# Patient Record
Sex: Male | Born: 1945 | Race: White | Hispanic: No | Marital: Married | Smoking: Former smoker
Health system: Southern US, Community
[De-identification: ages and names within clinical notes are randomized; demographics above are authoritative.]

## PROBLEM LIST (undated history)

## (undated) DIAGNOSIS — R51 Headache: Secondary | ICD-10-CM

## (undated) DIAGNOSIS — Z972 Presence of dental prosthetic device (complete) (partial): Secondary | ICD-10-CM

## (undated) DIAGNOSIS — M545 Low back pain, unspecified: Secondary | ICD-10-CM

## (undated) DIAGNOSIS — R296 Repeated falls: Secondary | ICD-10-CM

## (undated) DIAGNOSIS — G510 Bell's palsy: Secondary | ICD-10-CM

## (undated) DIAGNOSIS — C44621 Squamous cell carcinoma of skin of unspecified upper limb, including shoulder: Secondary | ICD-10-CM

## (undated) DIAGNOSIS — I10 Essential (primary) hypertension: Secondary | ICD-10-CM

## (undated) DIAGNOSIS — E119 Type 2 diabetes mellitus without complications: Secondary | ICD-10-CM

## (undated) DIAGNOSIS — B029 Zoster without complications: Secondary | ICD-10-CM

## (undated) DIAGNOSIS — M79605 Pain in left leg: Secondary | ICD-10-CM

## (undated) DIAGNOSIS — G214 Vascular parkinsonism: Secondary | ICD-10-CM

## (undated) DIAGNOSIS — M79669 Pain in unspecified lower leg: Secondary | ICD-10-CM

## (undated) DIAGNOSIS — I639 Cerebral infarction, unspecified: Secondary | ICD-10-CM

## (undated) DIAGNOSIS — R0602 Shortness of breath: Secondary | ICD-10-CM

## (undated) DIAGNOSIS — I209 Angina pectoris, unspecified: Secondary | ICD-10-CM

## (undated) DIAGNOSIS — F32A Depression, unspecified: Secondary | ICD-10-CM

## (undated) DIAGNOSIS — I2510 Atherosclerotic heart disease of native coronary artery without angina pectoris: Secondary | ICD-10-CM

## (undated) DIAGNOSIS — E785 Hyperlipidemia, unspecified: Secondary | ICD-10-CM

## (undated) DIAGNOSIS — F329 Major depressive disorder, single episode, unspecified: Secondary | ICD-10-CM

## (undated) DIAGNOSIS — E1142 Type 2 diabetes mellitus with diabetic polyneuropathy: Secondary | ICD-10-CM

## (undated) DIAGNOSIS — S32009A Unspecified fracture of unspecified lumbar vertebra, initial encounter for closed fracture: Secondary | ICD-10-CM

## (undated) DIAGNOSIS — K219 Gastro-esophageal reflux disease without esophagitis: Secondary | ICD-10-CM

## (undated) DIAGNOSIS — G8929 Other chronic pain: Secondary | ICD-10-CM

## (undated) HISTORY — DX: Essential (primary) hypertension: I10

## (undated) HISTORY — PX: FRACTURE SURGERY: SHX138

## (undated) HISTORY — PX: FOOT FRACTURE SURGERY: SHX645

## (undated) HISTORY — PX: BACK SURGERY: SHX140

## (undated) HISTORY — PX: CORONARY ANGIOPLASTY WITH STENT PLACEMENT: SHX49

## (undated) HISTORY — PX: APPENDECTOMY: SHX54

## (undated) HISTORY — DX: Angina pectoris, unspecified: I20.9

## (undated) HISTORY — DX: Major depressive disorder, single episode, unspecified: F32.9

## (undated) HISTORY — DX: Hyperlipidemia, unspecified: E78.5

## (undated) HISTORY — PX: TIBIA FRACTURE SURGERY: SHX806

## (undated) HISTORY — DX: Zoster without complications: B02.9

## (undated) HISTORY — DX: Cerebral infarction, unspecified: I63.9

## (undated) HISTORY — DX: Atherosclerotic heart disease of native coronary artery without angina pectoris: I25.10

## (undated) HISTORY — DX: Depression, unspecified: F32.A

## (undated) HISTORY — DX: Bell's palsy: G51.0

## (undated) HISTORY — DX: Pain in unspecified lower leg: M79.669

---

## 1979-07-21 HISTORY — PX: CHOLECYSTECTOMY OPEN: SUR202

## 1999-07-21 HISTORY — PX: TESTICLE SURGERY: SHX794

## 2006-08-23 ENCOUNTER — Emergency Department: Payer: Self-pay | Admitting: Emergency Medicine

## 2008-07-17 ENCOUNTER — Ambulatory Visit: Payer: Self-pay | Admitting: Family Medicine

## 2008-08-08 ENCOUNTER — Inpatient Hospital Stay: Payer: Self-pay | Admitting: Internal Medicine

## 2008-08-08 ENCOUNTER — Other Ambulatory Visit: Payer: Self-pay

## 2008-08-09 ENCOUNTER — Other Ambulatory Visit: Payer: Self-pay

## 2008-10-18 ENCOUNTER — Emergency Department: Payer: Self-pay | Admitting: Unknown Physician Specialty

## 2008-10-27 ENCOUNTER — Ambulatory Visit: Payer: Self-pay | Admitting: Unknown Physician Specialty

## 2008-11-02 ENCOUNTER — Ambulatory Visit: Payer: Self-pay | Admitting: Unknown Physician Specialty

## 2008-11-05 ENCOUNTER — Ambulatory Visit: Payer: Self-pay | Admitting: Unknown Physician Specialty

## 2008-12-07 ENCOUNTER — Encounter: Payer: Self-pay | Admitting: Unknown Physician Specialty

## 2008-12-20 ENCOUNTER — Encounter: Payer: Self-pay | Admitting: Unknown Physician Specialty

## 2010-03-21 ENCOUNTER — Ambulatory Visit: Payer: Self-pay | Admitting: Internal Medicine

## 2010-04-19 ENCOUNTER — Ambulatory Visit: Payer: Self-pay | Admitting: Internal Medicine

## 2010-05-19 ENCOUNTER — Ambulatory Visit: Payer: Self-pay | Admitting: Internal Medicine

## 2010-06-19 ENCOUNTER — Ambulatory Visit: Payer: Self-pay | Admitting: Internal Medicine

## 2011-05-18 LAB — HEMOGLOBIN A1C: Hgb A1c MFr Bld: 13 % — AB (ref 4.0–6.0)

## 2011-05-18 LAB — HEPATIC FUNCTION PANEL
ALT: 22 U/L (ref 10–40)
Alkaline Phosphatase: 91 U/L (ref 25–125)
Bilirubin, Total: 0.5 mg/dL

## 2011-06-28 ENCOUNTER — Encounter: Payer: Self-pay | Admitting: Internal Medicine

## 2011-07-04 ENCOUNTER — Encounter: Payer: Self-pay | Admitting: Internal Medicine

## 2011-07-04 DIAGNOSIS — F329 Major depressive disorder, single episode, unspecified: Secondary | ICD-10-CM | POA: Insufficient documentation

## 2011-07-04 DIAGNOSIS — I10 Essential (primary) hypertension: Secondary | ICD-10-CM | POA: Insufficient documentation

## 2011-07-04 DIAGNOSIS — I2510 Atherosclerotic heart disease of native coronary artery without angina pectoris: Secondary | ICD-10-CM | POA: Insufficient documentation

## 2011-07-04 DIAGNOSIS — E785 Hyperlipidemia, unspecified: Secondary | ICD-10-CM | POA: Insufficient documentation

## 2011-07-04 DIAGNOSIS — E1149 Type 2 diabetes mellitus with other diabetic neurological complication: Secondary | ICD-10-CM | POA: Insufficient documentation

## 2011-07-12 ENCOUNTER — Other Ambulatory Visit: Payer: Self-pay | Admitting: Internal Medicine

## 2011-07-12 DIAGNOSIS — E785 Hyperlipidemia, unspecified: Secondary | ICD-10-CM

## 2011-07-12 MED ORDER — LISINOPRIL-HYDROCHLOROTHIAZIDE 20-12.5 MG PO TABS: 1.0000 | ORAL_TABLET | Freq: Two times a day (BID) | ORAL | Status: DC

## 2011-07-12 NOTE — Telephone Encounter (Signed)
Faxed rx to [facility] to [facility]

## 2011-07-18 ENCOUNTER — Other Ambulatory Visit: Payer: Self-pay | Admitting: Internal Medicine

## 2011-07-19 ENCOUNTER — Ambulatory Visit (INDEPENDENT_AMBULATORY_CARE_PROVIDER_SITE_OTHER): Payer: Medicare Other | Admitting: Internal Medicine

## 2011-07-19 ENCOUNTER — Ambulatory Visit: Payer: Medicare Other | Admitting: Internal Medicine

## 2011-07-19 ENCOUNTER — Encounter: Payer: Self-pay | Admitting: Internal Medicine

## 2011-07-19 VITALS — BP 133/75 | HR 70 | Temp 98.2°F | Resp 16 | Ht 72.0 in | Wt 248.2 lb

## 2011-07-19 DIAGNOSIS — I10 Essential (primary) hypertension: Secondary | ICD-10-CM

## 2011-07-19 DIAGNOSIS — J189 Pneumonia, unspecified organism: Secondary | ICD-10-CM

## 2011-07-19 DIAGNOSIS — E119 Type 2 diabetes mellitus without complications: Secondary | ICD-10-CM

## 2011-07-19 DIAGNOSIS — E785 Hyperlipidemia, unspecified: Secondary | ICD-10-CM

## 2011-07-19 DIAGNOSIS — F329 Major depressive disorder, single episode, unspecified: Secondary | ICD-10-CM

## 2011-07-19 LAB — GLUCOSE, POCT (MANUAL RESULT ENTRY): POC Glucose: 306

## 2011-07-19 MED ORDER — LISINOPRIL-HYDROCHLOROTHIAZIDE 20-12.5 MG PO TABS: 1.0000 | ORAL_TABLET | Freq: Two times a day (BID) | ORAL | Status: DC

## 2011-07-19 MED ORDER — DULOXETINE HCL 60 MG PO CPEP: 60.0000 mg | ORAL_CAPSULE | Freq: Every day | ORAL | Status: DC

## 2011-07-19 MED ORDER — LEVOFLOXACIN 750 MG PO TABS: 750.0000 mg | ORAL_TABLET | Freq: Every day | ORAL | Status: AC

## 2011-07-19 NOTE — Telephone Encounter (Signed)
Opened in error

## 2011-07-19 NOTE — Patient Instructions (Signed)
Pneumonia Pneumonia is an infection of the lungs. It may be caused by a bacteria or virus. Most forms are bacterial. Usually, these infections are caused by breathing infectious particles into the lungs (respiratory tract). SYMPTOMS  The most common problems (symptoms) are:   Cough.  Fever.   Chest pain.  Increased rate of breathing.   Wheezing.  Mucus production.   DIAGNOSIS  Often these infections are diagnosed on exam by your caregiver. Sometimes the diagnosis may require:   Chest X-rays.   Blood analysis.   Cultures. Blood cultures may be done to help find the cause of your pneumonia.  Your caregiver may do tests (blood gasses or pulse oximetry) to see how well your lungs are working. TREATMENT The bacterial pneumonias generally respond well to medicines (antibiotics) that kill germs. Viral infections must run their course. These infections will not respond to antibiotics. A pneumococcal shot (vaccine) is available to prevent a common bacterial pneumonia. This is usually suggested for the elderly and for other groups of higher risk individuals, such as those on chemotherapy or those who have problems with their immune system.  You will have pneumococcal screening or vaccination if you are over 65 years old and are not immunized.   If you are a smoker, it is time to quit. You may receive instructions on how to best stop smoking. Your caregiver can provide medicines and counseling to help you quit.  HOME CARE INSTRUCTIONS  Cough suppressants may be used if you are losing too much rest. However, coughing protects you by clearing your lungs. This is one reason for not using cough suppressants, if able, as they take away this protection.   Your caregiver may have prescribed an antibiotic if she or he feels your cough is caused by a bacterial infection. Take all your medicine until you are finished.   Your caregiver may also prescribe an expectorant to loosen the mucus to be coughed  up.   Only take over-the-counter or prescription medicines for pain, discomfort, or fever as directed by your caregiver.   Smoking is a common cause of bronchitis and can contribute to pneumonia. Stopping this habit is an important self-help step.   If you are a smoker and continue to smoke, your cough may last several weeks after your pneumonia has cleared.   A cold steam vaporizer or humidifier in your room or home may help loosen mucus.   Coughing is often worse at night. Sleeping in a semi-upright position in a recliner or using a couple pillows under your head will help with this.   Get rest as you feel it is needed. Your body will usually let you know when to rest.  SEEK IMMEDIATE MEDICAL CARE IF:  You develop pus-like mucus (sputum) or your illness becomes worse. This is especially true if you are elderly or weakened from any other disease.   You cannot control your cough with suppressants and are losing sleep.   You begin coughing up blood.   You develop pain which is getting worse or is uncontrolled with medicines.   You or your child has an oral temperature above 101.5, not controlled by medicine.   Any of the symptoms which initially brought you in for treatment are getting worse rather than better.   You develop shortness of breath or chest pain.  MAKE SURE YOU:   Understand these instructions.   Will watch your condition.   Will get help right away if you are not doing well or   get worse.  Document Released: 11/05/2005 Document Re-Released: 04/25/2010 [employer] Patient Information 2011 [employer]. 

## 2011-07-19 NOTE — Progress Notes (Signed)
Subjective:    Patient ID: Steve Phillips, male    DOB: 02/09/1946, 65 y.o.   MRN: [id number]  Diabetes He presents for his follow-up diabetic visit. He has type 2 diabetes mellitus. No MedicAlert identification noted. His disease course has been worsening. Hypoglycemia symptoms include headaches and sweats. Pertinent negatives for hypoglycemia include no nervousness/anxiousness. Associated symptoms include fatigue and weight loss. Pertinent negatives for diabetes include no blurred vision, no chest pain, no foot ulcerations, no polydipsia, no polyphagia, no polyuria, no visual change and no weakness. There are no hypoglycemic complications. Symptoms are stable. Diabetic complications include heart disease and peripheral neuropathy. Risk factors for coronary artery disease include diabetes mellitus, dyslipidemia, family history, hypertension, male sex, obesity and sedentary lifestyle. Current diabetic treatment includes diet and insulin injections. He is compliant with treatment most of the time. His weight is stable. He is following a diabetic and generally healthy diet. He has not had a previous visit with a dietician. He participates in exercise intermittently. His home blood glucose trend is increasing steadily. His breakfast blood glucose range is generally >200 mg/dl. (Blood sugars have been elevated in setting of recent illness) An ACE inhibitor/angiotensin II receptor blocker is being taken. Eye exam is current.  Cough This is a new problem. The current episode started 1 to 4 weeks ago. The problem has been unchanged. The problem occurs every few minutes. The cough is productive of purulent sputum. Associated symptoms include chills, ear congestion, ear pain, headaches, myalgias, nasal congestion, postnasal drip, a sore throat, shortness of breath, sweats and weight loss. Pertinent negatives include no chest pain, fever, rash or wheezing. The symptoms are aggravated by nothing. Treatments tried:  augmentin x7 days. The treatment provided mild relief. There is no history of asthma or COPD.      Review of Systems  Constitutional: Positive for chills, weight loss and fatigue. Negative for fever, activity change, appetite change and unexpected weight change.  HENT: Positive for ear pain, sore throat, postnasal drip and sinus pressure.   Eyes: Negative for blurred vision and visual disturbance.  Respiratory: Positive for cough, chest tightness and shortness of breath. Negative for wheezing.   Cardiovascular: Negative for chest pain, palpitations and leg swelling.  Gastrointestinal: Negative for nausea, abdominal pain, diarrhea, constipation and abdominal distention.  Genitourinary: Negative for dysuria, urgency, polyuria and difficulty urinating.  Musculoskeletal: Positive for myalgias. Negative for arthralgias and gait problem.  Skin: Negative for color change and rash.  Neurological: Positive for headaches. Negative for weakness.  Hematological: Negative for polydipsia, polyphagia and adenopathy.  Psychiatric/Behavioral: Negative for sleep disturbance and dysphoric mood. The patient is not nervous/anxious.        Objective:   Physical Exam  Constitutional: He is oriented to person, place, and time. He appears well-developed and well-nourished. No distress.  HENT:  Head: Normocephalic and atraumatic.  Right Ear: External ear normal.  Left Ear: External ear normal.  Nose: Nose normal.  Mouth/Throat: Oropharynx is clear and moist. No oropharyngeal exudate.  Eyes: Conjunctivae and EOM are normal. Pupils are equal, round, and reactive to light. Right eye exhibits no discharge. Left eye exhibits no discharge. No scleral icterus.  Neck: Normal range of motion. Neck supple. No tracheal deviation present. No thyromegaly present.  Cardiovascular: Normal rate, regular rhythm and normal heart sounds.  Exam reveals no gallop and no friction rub.   No murmur heard. Pulmonary/Chest: No  accessory muscle usage. Not tachypneic. No respiratory distress. He has no decreased breath sounds. He has  no wheezes. He has no rhonchi. He has rales in the right lower field. He exhibits no tenderness.  Musculoskeletal: Normal range of motion. He exhibits no edema.  Lymphadenopathy:    He has no cervical adenopathy.  Neurological: He is alert and oriented to person, place, and time. No cranial nerve deficit. Coordination normal.  Skin: Skin is warm and dry. No rash noted. He is not diaphoretic. No erythema. No pallor.  Psychiatric: He has a normal mood and affect. His behavior is normal. Judgment and thought content normal.          Assessment & Plan:  1. Pneumonia - patient with a three-week history of cough productive of purulent sputum. Was initially seen by another provider and treated with a seven-day course of Augmentin. He reports minimal improvement then recurrence of his symptoms. Exam today is remarkable for right lower lobe Rales. Will get chest x-ray today. Will treat with Levaquin 750 mg daily x7 days. Patient will return to clinic in one week for recheck. He will call or come to clinic sooner should his symptoms worsen.  2. Diabetes - patient with a history of diabetes which recently has been poorly controlled secondary to non-compliance with diet and medications. Last hemoglobin A1c was 13% in 05/2011. One month ago, he had made significant improvement in his diet with a reduction in fasting sugars closer to 150. However in the setting of his recent illness his sugars have increased, with fasting blood sugars in the 200s. Will recheck hemoglobin A1c today. He will return to clinic in one week as above. He will bring record of his blood sugars to that visit.

## 2011-07-20 LAB — COMPREHENSIVE METABOLIC PANEL
ALT: 28 U/L (ref 0–53)
Albumin: 4 g/dL (ref 3.5–5.2)
BUN: 15 mg/dL (ref 6–23)
CO2: 27 mEq/L (ref 19–32)
Calcium: 9.6 mg/dL (ref 8.4–10.5)
Chloride: 101 mEq/L (ref 96–112)
Creatinine, Ser: 0.9 mg/dL (ref 0.4–1.5)
GFR: 89.45 mL/min (ref >60.00)

## 2011-07-20 LAB — CBC WITH DIFFERENTIAL/PLATELET
Basophils Absolute: 0.1 10*3/uL (ref 0.0–0.1)
Basophils Relative: 0.7 % (ref 0.0–3.0)
Hemoglobin: 15.8 g/dL (ref 13.0–17.0)
Lymphocytes Relative: 14.4 % (ref 12.0–46.0)
Monocytes Relative: 5.3 % (ref 3.0–12.0)
Neutro Abs: 11 10*3/uL — ABNORMAL HIGH (ref 1.4–7.7)
RBC: 6.24 Mil/uL — ABNORMAL HIGH (ref 4.22–5.81)

## 2011-07-25 ENCOUNTER — Encounter: Payer: Self-pay | Admitting: Internal Medicine

## 2011-07-27 ENCOUNTER — Encounter: Payer: Self-pay | Admitting: Internal Medicine

## 2011-07-27 ENCOUNTER — Ambulatory Visit (INDEPENDENT_AMBULATORY_CARE_PROVIDER_SITE_OTHER): Payer: Medicare Other | Admitting: Internal Medicine

## 2011-07-27 VITALS — BP 131/76 | HR 73 | Temp 98.0°F | Resp 16 | Ht 72.0 in | Wt 252.2 lb

## 2011-07-27 DIAGNOSIS — H6090 Unspecified otitis externa, unspecified ear: Secondary | ICD-10-CM

## 2011-07-27 DIAGNOSIS — H60399 Other infective otitis externa, unspecified ear: Secondary | ICD-10-CM

## 2011-07-27 DIAGNOSIS — J40 Bronchitis, not specified as acute or chronic: Secondary | ICD-10-CM

## 2011-07-27 DIAGNOSIS — E119 Type 2 diabetes mellitus without complications: Secondary | ICD-10-CM

## 2011-07-27 MED ORDER — CIPROFLOXACIN-DEXAMETHASONE 0.3-0.1 % OT SUSP: 4.0000 [drp] | Freq: Two times a day (BID) | OTIC | Status: AC

## 2011-07-27 NOTE — Patient Instructions (Signed)
ENT visit next week. Follow up in 3 months or earlier if needed.

## 2011-07-27 NOTE — Progress Notes (Signed)
Subjective:    Patient ID: Steve Phillips, male    DOB: [birth year], 65 y.o.   MRN: [id number]  HPI  Mr. Bishop is a 65 year old male with a history of diabetes who presents to followup a recent episode of bronchitis. He reports since completing treatment with Levaquin his symptoms have significantly improved. He continues to have some mild chest congestion and occasional cough but denies any purulent sputum, fever, chest pain or other complaints.  Mr. Quast is concerned about drainage from his right ear. He reports a history of surgical repair of the right eardrum by Dr. Andee Poles. Over the last several weeks he has noticed purulent drainage from his right ear. He occasionally has pain in the right ear but this is minimal. As above, he denies any fever. And, notably, he recently completed a seven-day course of Levaquin.  Mr. Harshfield has a history of diabetes mellitus which had previously been very poorly controlled with a hemoglobin A1c of 13%. At his last visit he had his A1c repeated and it was noted to be 9.9%. He has made a significant effort to improve his diet by decreasing intake of processed carbohydrates. He reports much improved compliance with his medications. He denies any low sugars less than 80. He continues to have some high sugars greater than 200.  Outpatient Encounter Prescriptions as of 07/27/2011  Medication Sig Dispense Refill  . amLODipine-valsartan (EXFORGE) 10-320 MG per tablet Take 1 tablet by mouth daily.        Marland Kitchen aspirin 81 MG tablet Take 81 mg by mouth daily.        Marland Kitchen atorvastatin (LIPITOR) 10 MG tablet Take 10 mg by mouth daily.        . carvedilol (COREG) 25 MG tablet Take 25 mg by mouth 2 (two) times daily.        . ciprofloxacin-dexamethasone (CIPRODEX) otic suspension Place 4 drops into the right ear 2 (two) times daily.  7.5 mL  0  . clopidogrel (PLAVIX) 75 MG tablet Take 75 mg by mouth daily.        . DULoxetine (CYMBALTA) 60 MG capsule Take 1 capsule (60 mg total) by  mouth daily.  90 capsule  4  . Eszopiclone (ESZOPICLONE) 3 MG TABS Take 3 mg by mouth at bedtime. Take immediately before bedtime       . furosemide (LASIX) 20 MG tablet Take 20 mg by mouth daily.        . insulin aspart (NOVOLOG FLEXPEN) 100 UNIT/ML injection Inject into the skin. As directed       . insulin glargine (LANTUS) 100 UNIT/ML injection Inject 80 Units into the skin 2 (two) times daily.        . isosorbide mononitrate (IMDUR) 30 MG 24 hr tablet Take 30 mg by mouth daily.        Marland Kitchen levofloxacin (LEVAQUIN) 750 MG tablet Take 1 tablet (750 mg total) by mouth daily.  7 tablet  0  . lisinopril-hydrochlorothiazide (PRINZIDE,ZESTORETIC) 20-12.5 MG per tablet Take 1 tablet by mouth 2 (two) times daily.  180 tablet  4  . metFORMIN (GLUMETZA) 1000 MG (MOD) 24 hr tablet Take 1,000 mg by mouth 2 (two) times daily.        Marland Kitchen NITROGLYCERIN PO Take by mouth. Sublingual....specific dose is unknown       . pantoprazole (PROTONIX) 40 MG tablet Take 40 mg by mouth daily.           Review of Systems  Constitutional: Negative  for fever, chills, activity change, appetite change, fatigue and unexpected weight change.  HENT: Positive for ear pain, congestion and ear discharge. Negative for neck pain, neck stiffness and sinus pressure.   Eyes: Negative for visual disturbance.  Respiratory: Positive for cough. Negative for shortness of breath and wheezing.   Cardiovascular: Negative for chest pain, palpitations and leg swelling.  Gastrointestinal: Negative for abdominal pain and abdominal distention.  Genitourinary: Negative for dysuria, urgency and difficulty urinating.  Musculoskeletal: Negative for arthralgias and gait problem.  Skin: Negative for color change and rash.  Hematological: Negative for adenopathy.  Psychiatric/Behavioral: Negative for sleep disturbance and dysphoric mood. The patient is not nervous/anxious.        BP 131/76  Pulse 73  Temp(Src) 98 F (36.7 C) (Oral)  Resp 16  Ht 6'  (1.829 m)  Wt 252 lb 4 oz (114.42 kg)  BMI 34.21 kg/m2  SpO2 97%  Objective:   Physical Exam  Constitutional: He is oriented to person, place, and time. He appears well-developed and well-nourished. No distress.  HENT:  Head: Normocephalic and atraumatic.  Right Ear: There is drainage (white). No tenderness.  Left Ear: Tympanic membrane, external ear and ear canal normal.  Nose: Nose normal.  Mouth/Throat: Oropharynx is clear and moist. No oropharyngeal exudate.  Eyes: Conjunctivae and EOM are normal. Pupils are equal, round, and reactive to light. Right eye exhibits no discharge. Left eye exhibits no discharge. No scleral icterus.  Neck: Normal range of motion. Neck supple. No tracheal deviation present. No thyromegaly present.  Cardiovascular: Normal rate, regular rhythm and normal heart sounds.  Exam reveals no gallop and no friction rub.   No murmur heard. Pulmonary/Chest: Effort normal and breath sounds normal. No respiratory distress. He has no wheezes. He has no rales. He exhibits no tenderness.  Musculoskeletal: Normal range of motion. He exhibits no edema.  Lymphadenopathy:    He has no cervical adenopathy.  Neurological: He is alert and oriented to person, place, and time. No cranial nerve deficit. Coordination normal.  Skin: Skin is warm and dry. No rash noted. He is not diaphoretic. No erythema. No pallor.  Psychiatric: He has a normal mood and affect. His behavior is normal. Judgment and thought content normal.          Assessment & Plan:  1. Bronchitis - patient with the recent history of bronchitis treated with Levaquin. Chest x-ray was normal. Labs were remarkable for elevated white blood cell count. He is improved and exam is normal today. Will continue to follow. He will call or return to clinic should symptoms recur.  2. Otitis Externa - patient with recent drainage from right ear. Exam is remarkable for purulent debris within the right ear canal. He reports a  history of surgical repair of his right eardrum. Will refer back to ENT physician for evaluation and possible culture of the fluid from the right ear.  3. Diabetes Mellitus - patient with history of diabetes which has been poorly controlled in the past with A1c of 13%. His A1c drawn at last visit showed marked improvement, down to 9.9%. Encouraged him to continue with efforts at improved diet and physical activity as well as better compliance with medications. He will have A1c repeated again in 3 months. Note that he is on a research study at [facility]. We will need to obtain records from them, as well as records on recent eye exam.

## 2011-08-01 ENCOUNTER — Encounter: Payer: Self-pay | Admitting: Internal Medicine

## 2011-08-17 ENCOUNTER — Other Ambulatory Visit: Payer: Self-pay | Admitting: Internal Medicine

## 2011-08-17 MED ORDER — METFORMIN HCL ER (MOD) 1000 MG PO TB24: 1000.0000 mg | ORAL_TABLET | Freq: Two times a day (BID) | ORAL | Status: DC

## 2011-08-17 NOTE — Telephone Encounter (Signed)
Patient spouse called stating her husband was giving a 30 day supply of Lisinopril and he needs a 90 day supply. Patient's spouse requesting that this be called in today if possible.

## 2011-08-17 NOTE — Telephone Encounter (Signed)
Informed wife the 90 day supply of lisinopril was filled on 07-19-11. She also stated that he needs 90 day supply of metformin.

## 2011-08-22 ENCOUNTER — Other Ambulatory Visit: Payer: Self-pay | Admitting: Internal Medicine

## 2011-09-05 ENCOUNTER — Encounter: Payer: Self-pay | Admitting: Internal Medicine

## 2011-09-05 ENCOUNTER — Ambulatory Visit: Payer: Medicare Other | Admitting: Internal Medicine

## 2011-09-05 ENCOUNTER — Ambulatory Visit (INDEPENDENT_AMBULATORY_CARE_PROVIDER_SITE_OTHER): Payer: Medicare Other | Admitting: Internal Medicine

## 2011-09-05 VITALS — BP 184/100 | HR 93 | Temp 98.5°F | Resp 16 | Ht 72.0 in | Wt 246.5 lb

## 2011-09-05 DIAGNOSIS — N509 Disorder of male genital organs, unspecified: Secondary | ICD-10-CM

## 2011-09-05 DIAGNOSIS — N50812 Left testicular pain: Secondary | ICD-10-CM

## 2011-09-05 DIAGNOSIS — R05 Cough: Secondary | ICD-10-CM

## 2011-09-05 LAB — POCT URINALYSIS DIPSTICK
Bilirubin, UA: NEGATIVE
Glucose, UA: 500
Leukocytes, UA: NEGATIVE
Spec Grav, UA: 1.03

## 2011-09-05 MED ORDER — LEVOFLOXACIN 750 MG PO TABS: 750.0000 mg | ORAL_TABLET | Freq: Every day | ORAL | Status: DC

## 2011-09-05 MED ORDER — HYDROCODONE-ACETAMINOPHEN 5-500 MG PO TABS: 2.0000 | ORAL_TABLET | Freq: Four times a day (QID) | ORAL | Status: DC | PRN

## 2011-09-05 NOTE — Progress Notes (Signed)
Subjective:    Patient ID: Steve Phillips, male    DOB: 1946-02-03, 65 y.o.   MRN: [id number]  HPI Steve Phillips is a 65 year old male with a history of diabetes and hypertension who presents for an acute visit complaining of left testicular pain. He noted that this pain initially started 2 days ago. He denies any known injury to his testicle. He reports that the pain is constant. He has noted redness and swelling over his left testicle but no palpable mass or other lesion. He denies any pain with urination. He denies any flank pain. He denies any fever or chills. He denies any hematuria. He denies any constipation or diarrhea.  He also complains of recent cough and  congestion. Notably, he was treated approximately one month ago for pneumonia. His symptoms resolved with treatment with antibiotic, however he reports over the last couple of days he started to develop recurrent cough productive of thick yellow sputum. He denies any shortness of breath. He denies any fever or chills. He denies any sore throat. He recently spent time with his grandchildren.  Outpatient Encounter Prescriptions as of 09/05/2011  Medication Sig Dispense Refill  . amLODipine-valsartan (EXFORGE) 10-320 MG per tablet Take 1 tablet by mouth daily.        Marland Kitchen aspirin 81 MG tablet Take 81 mg by mouth daily.        Marland Kitchen atorvastatin (LIPITOR) 10 MG tablet Take 10 mg by mouth daily.        . carvedilol (COREG) 25 MG tablet Take 25 mg by mouth 2 (two) times daily.        . clopidogrel (PLAVIX) 75 MG tablet Take 75 mg by mouth daily.        . DULoxetine (CYMBALTA) 60 MG capsule Take 1 capsule (60 mg total) by mouth daily.  90 capsule  4  . Eszopiclone (ESZOPICLONE) 3 MG TABS Take 3 mg by mouth at bedtime. Take immediately before bedtime       . furosemide (LASIX) 20 MG tablet Take 20 mg by mouth daily.        . insulin aspart (NOVOLOG FLEXPEN) 100 UNIT/ML injection Inject into the skin. As directed       . insulin glargine (LANTUS) 100  UNIT/ML injection Inject 80 Units into the skin 2 (two) times daily.        . isosorbide mononitrate (IMDUR) 30 MG 24 hr tablet Take 30 mg by mouth daily.        Marland Kitchen lisinopril-hydrochlorothiazide (PRINZIDE,ZESTORETIC) 20-12.5 MG per tablet Take 1 tablet by mouth 2 (two) times daily.  180 tablet  4  . metFORMIN (GLUMETZA) 1000 MG (MOD) 24 hr tablet Take 1 tablet (1,000 mg total) by mouth 2 (two) times daily.  180 tablet  4  . NITROGLYCERIN PO Take by mouth. Sublingual....specific dose is unknown       . pantoprazole (PROTONIX) 40 MG tablet Take 40 mg by mouth daily.          Review of Systems  Constitutional: Negative for fever, chills, activity change, appetite change, fatigue and unexpected weight change.  HENT: Positive for congestion and rhinorrhea. Negative for ear pain and ear discharge.   Eyes: Negative for visual disturbance.  Respiratory: Positive for cough. Negative for shortness of breath.   Cardiovascular: Negative for chest pain, palpitations and leg swelling.  Gastrointestinal: Negative for nausea, abdominal pain, diarrhea, constipation, abdominal distention and rectal pain.  Genitourinary: Positive for scrotal swelling and testicular pain. Negative for dysuria,  urgency, frequency, hematuria, flank pain, penile swelling, difficulty urinating, genital sores and penile pain.  Musculoskeletal: Negative for arthralgias and gait problem.  Skin: Positive for color change. Negative for rash.  Hematological: Negative for adenopathy.  Psychiatric/Behavioral: Negative for sleep disturbance and dysphoric mood. The patient is not nervous/anxious.    BP 184/100  Pulse 93  Temp(Src) 98.5 F (36.9 C) (Oral)  Resp 16  Ht 6' (1.829 m)  Wt 246 lb 8 oz (111.812 kg)  BMI 33.43 kg/m2  SpO2 97%     Objective:   Physical Exam  Constitutional: He is oriented to person, place, and time. He appears well-developed and well-nourished. No distress.  HENT:  Head: Normocephalic and atraumatic.    Right Ear: External ear normal.  Left Ear: External ear normal.  Nose: Nose normal.  Mouth/Throat: Oropharynx is clear and moist. No oropharyngeal exudate.  Eyes: Conjunctivae and EOM are normal. Pupils are equal, round, and reactive to light. Right eye exhibits no discharge. Left eye exhibits no discharge. No scleral icterus.  Neck: Normal range of motion. Neck supple. No tracheal deviation present. No thyromegaly present.  Cardiovascular: Normal rate, regular rhythm and normal heart sounds.  Exam reveals no gallop and no friction rub.   No murmur heard. Pulmonary/Chest: Effort normal. No accessory muscle usage. Not tachypneic. No respiratory distress. He has no decreased breath sounds. He has no wheezes. He has rhonchi in the right middle field, the left upper field and the left middle field. He has no rales. He exhibits no tenderness.  Genitourinary:    Right testis shows no mass, no swelling and no tenderness. Right testis is descended. Cremasteric reflex is not absent on the right side. Left testis shows swelling and tenderness. Left testis shows no mass.  Musculoskeletal: Normal range of motion. He exhibits no edema.  Lymphadenopathy:    He has no cervical adenopathy.  Neurological: He is alert and oriented to person, place, and time. No cranial nerve deficit. Coordination normal.  Skin: Skin is warm and dry. No rash noted. He is not diaphoretic. No erythema. No pallor.  Psychiatric: He has a normal mood and affect. His behavior is normal. Judgment and thought content normal.          Assessment & Plan:  1. Testicular pain -testicle is indurated and tender to palpation however there is no palpable mass. Appearance is most consistent with cellulitis. We'll start treatment with Levaquin. We will set him up for testicular ultrasound to ensure no torsion. We will call him with results of study.  2. Cough -patient with cough and congestion. Most likely viral syndrome after spending  time with his grandchildren. However, given the recent episode of pneumonia and rhonchi noted on exam will get chest x-ray for repeat evaluation. As above, will start Levaquin. He will followup in one week.

## 2011-09-06 ENCOUNTER — Telehealth: Payer: Self-pay | Admitting: Internal Medicine

## 2011-09-06 ENCOUNTER — Inpatient Hospital Stay: Payer: Medicare Other | Admitting: Internal Medicine

## 2011-09-06 NOTE — Telephone Encounter (Signed)
Patient would like to know xray results Cell [phone]

## 2011-09-06 NOTE — Telephone Encounter (Signed)
Informed patient of results

## 2011-09-10 ENCOUNTER — Telehealth: Payer: Self-pay | Admitting: Internal Medicine

## 2011-09-10 NOTE — Telephone Encounter (Signed)
Pt wife called they admitted Steve Phillips Thursday and did surgery Friday night.  Pt is doing better still has walked.  Still in hosptial room [address] [facility]

## 2011-09-13 ENCOUNTER — Ambulatory Visit (INDEPENDENT_AMBULATORY_CARE_PROVIDER_SITE_OTHER): Payer: Medicare Other | Admitting: Internal Medicine

## 2011-09-13 ENCOUNTER — Ambulatory Visit: Payer: Medicare Other | Admitting: Internal Medicine

## 2011-09-13 ENCOUNTER — Encounter: Payer: Self-pay | Admitting: Internal Medicine

## 2011-09-13 VITALS — BP 169/92 | HR 70 | Temp 97.8°F | Resp 16 | Ht 72.0 in | Wt 246.0 lb

## 2011-09-13 DIAGNOSIS — E119 Type 2 diabetes mellitus without complications: Secondary | ICD-10-CM

## 2011-09-13 DIAGNOSIS — I10 Essential (primary) hypertension: Secondary | ICD-10-CM

## 2011-09-13 DIAGNOSIS — N454 Abscess of epididymis or testis: Secondary | ICD-10-CM

## 2011-09-13 DIAGNOSIS — E785 Hyperlipidemia, unspecified: Secondary | ICD-10-CM

## 2011-09-13 LAB — GLUCOSE, POCT (MANUAL RESULT ENTRY): POC Glucose: 166

## 2011-09-13 MED ORDER — LISINOPRIL-HYDROCHLOROTHIAZIDE 20-12.5 MG PO TABS: 1.0000 | ORAL_TABLET | Freq: Two times a day (BID) | ORAL | Status: DC

## 2011-09-13 NOTE — Patient Instructions (Signed)
Follow up with Dr. Michela Pitcher Oct 30th. Follow up here in 1 month. Call if any questions or concerns.

## 2011-09-13 NOTE — Progress Notes (Signed)
Subjective:    Patient ID: Steve Phillips, male    DOB: [birth year], 65 y.o.   MRN: [id number]  HPI 65 year old male with diabetes presents for followup after recent hospitalization for left testicular abscess. He was evaluated at [facility] and had a left testicular abscess drained revealing culture positive for strep agalactiae. He was initially treated with vancomycin and Zosyn and then was transitioned to oral Augmentin. He reports significant improvement in his pain. He continues to have a left testicular drain in place with minimal serous drainage. He denies any further fever. He reports that he has followup with surgery next week.  Outpatient Encounter Prescriptions as of 09/13/2011  Medication Sig Dispense Refill  . amLODipine-valsartan (EXFORGE) 10-320 MG per tablet Take 1 tablet by mouth daily.        Marland Kitchen aspirin 81 MG tablet Take 81 mg by mouth daily.        Marland Kitchen atorvastatin (LIPITOR) 10 MG tablet Take 10 mg by mouth daily.        . carvedilol (COREG) 25 MG tablet Take 25 mg by mouth 2 (two) times daily.        . clopidogrel (PLAVIX) 75 MG tablet Take 75 mg by mouth daily.        . DULoxetine (CYMBALTA) 60 MG capsule Take 1 capsule (60 mg total) by mouth daily.  90 capsule  4  . Eszopiclone (ESZOPICLONE) 3 MG TABS Take 3 mg by mouth at bedtime. Take immediately before bedtime       . furosemide (LASIX) 20 MG tablet Take 20 mg by mouth daily.        Marland Kitchen HYDROcodone-acetaminophen (VICODIN) 5-500 MG per tablet Take 2 tablets by mouth every 6 (six) hours as needed for pain.  60 tablet  0  . insulin aspart (NOVOLOG FLEXPEN) 100 UNIT/ML injection Inject into the skin. As directed       . insulin glargine (LANTUS) 100 UNIT/ML injection Inject 80 Units into the skin 2 (two) times daily.        . isosorbide mononitrate (IMDUR) 30 MG 24 hr tablet Take 30 mg by mouth daily.        Marland Kitchen lisinopril-hydrochlorothiazide (PRINZIDE,ZESTORETIC) 20-12.5 MG per tablet Take 1 tablet by mouth 2  (two) times daily.  180 tablet  4  . metFORMIN (GLUMETZA) 1000 MG (MOD) 24 hr tablet Take 1 tablet (1,000 mg total) by mouth 2 (two) times daily.  180 tablet  4  . NITROGLYCERIN PO Take by mouth. Sublingual....specific dose is unknown       . pantoprazole (PROTONIX) 40 MG tablet Take 40 mg by mouth daily.          Review of Systems  Constitutional: Negative for fever, chills, activity change, appetite change, fatigue and unexpected weight change.  Eyes: Negative for visual disturbance.  Respiratory: Negative for cough and shortness of breath.   Cardiovascular: Negative for chest pain, palpitations and leg swelling.  Gastrointestinal: Negative for abdominal pain and abdominal distention.  Genitourinary: Positive for scrotal swelling and testicular pain. Negative for dysuria, urgency and difficulty urinating.  Musculoskeletal: Negative for arthralgias and gait problem.  Skin: Negative for color change and rash.  Hematological: Negative for adenopathy.  Psychiatric/Behavioral: Negative for sleep disturbance and dysphoric mood. The patient is not nervous/anxious.    BP 169/92  Pulse 70  Temp(Src) 97.8 F (36.6 C) (Oral)  Resp 16  Ht 6' (1.829 m)  Wt 246 lb (111.585 kg)  BMI 33.36 kg/m2  SpO2 98%     Objective:   Physical Exam  Constitutional: He is oriented to person, place, and time. He appears well-developed and well-nourished. No distress.  HENT:  Head: Normocephalic and atraumatic.  Right Ear: External ear normal.  Left Ear: External ear normal.  Nose: Nose normal.  Mouth/Throat: Oropharynx is clear and moist. No oropharyngeal exudate.  Eyes: Conjunctivae and EOM are normal. Pupils are equal, round, and reactive to light. Right eye exhibits no discharge. Left eye exhibits no discharge. No scleral icterus.  Neck: Normal range of motion. Neck supple. No tracheal deviation present. No thyromegaly present.  Cardiovascular: Normal rate, regular rhythm and normal heart sounds.  Exam  reveals no gallop and no friction rub.   No murmur heard. Pulmonary/Chest: Effort normal and breath sounds normal. No respiratory distress. He has no wheezes. He has no rales. He exhibits no tenderness.  Genitourinary:     Musculoskeletal: Normal range of motion. He exhibits no edema.  Lymphadenopathy:    He has no cervical adenopathy.  Neurological: He is alert and oriented to person, place, and time. No cranial nerve deficit. Coordination normal.  Skin: Skin is warm and dry. No rash noted. He is not diaphoretic. No erythema. No pallor.  Psychiatric: He has a normal mood and affect. His behavior is normal. Judgment and thought content normal.          Assessment & Plan:  1. Left testicular abscess - symptoms markedly improved after drainage by surgery. Patient will continue Augmentin to complete a two-week course. He has followup with surgery next week to have drain removed. He will call or return to clinic if symptoms are worsening.

## 2011-09-14 ENCOUNTER — Telehealth: Payer: Self-pay | Admitting: Internal Medicine

## 2011-09-14 NOTE — Telephone Encounter (Signed)
Patient wants test strips called into the pharmacy for his glucose meter accucheck na/no.

## 2011-09-14 NOTE — Telephone Encounter (Signed)
Call the test strips in to [facility] [place]

## 2011-09-14 NOTE — Telephone Encounter (Signed)
Pam, Can you call this in.

## 2011-09-14 NOTE — Telephone Encounter (Signed)
Called in Acc chek test strips to [facility] in [place].

## 2011-09-17 ENCOUNTER — Telehealth: Payer: Self-pay | Admitting: Internal Medicine

## 2011-09-18 NOTE — Telephone Encounter (Signed)
Opened in error

## 2011-09-20 ENCOUNTER — Telehealth: Payer: Self-pay | Admitting: Internal Medicine

## 2011-09-20 MED ORDER — GLUCOSE BLOOD VI STRP: 1.0000 | ORAL_STRIP | Freq: Three times a day (TID) | Status: DC | PRN

## 2011-09-20 NOTE — Telephone Encounter (Signed)
Done. Wife informed. 

## 2011-09-20 NOTE — Telephone Encounter (Signed)
[phone]  Kendal Hymen called [facility] [place] did not get acu check nano strip. Please call pt when rx is called in  Pt is complete out of strips

## 2011-09-22 ENCOUNTER — Telehealth: Payer: Self-pay | Admitting: *Deleted

## 2011-09-22 MED ORDER — TRAZODONE HCL 50 MG PO TABS: 50.0000 mg | ORAL_TABLET | Freq: Every day | ORAL | Status: AC

## 2011-09-22 NOTE — Telephone Encounter (Signed)
[facility] faxed note to MD - pt c/o difficulty sleeping at night. Ok trazodone 50 mg hs, if no improvement increase to 100 mg hs. Faxed back instructions to [facility], RX called into patient's pharmacy.

## 2011-09-26 ENCOUNTER — Ambulatory Visit: Payer: Medicare Other | Admitting: Internal Medicine

## 2011-09-26 ENCOUNTER — Encounter: Payer: Self-pay | Admitting: Internal Medicine

## 2011-10-31 ENCOUNTER — Encounter: Payer: Self-pay | Admitting: Internal Medicine

## 2011-10-31 ENCOUNTER — Ambulatory Visit (INDEPENDENT_AMBULATORY_CARE_PROVIDER_SITE_OTHER): Payer: Medicare Other | Admitting: Internal Medicine

## 2011-10-31 VITALS — BP 178/90 | HR 67 | Temp 97.8°F | Wt 243.0 lb

## 2011-10-31 DIAGNOSIS — I10 Essential (primary) hypertension: Secondary | ICD-10-CM

## 2011-10-31 DIAGNOSIS — E119 Type 2 diabetes mellitus without complications: Secondary | ICD-10-CM

## 2011-10-31 DIAGNOSIS — G4700 Insomnia, unspecified: Secondary | ICD-10-CM

## 2011-10-31 MED ORDER — HYDRALAZINE HCL 25 MG PO TABS: 25.0000 mg | ORAL_TABLET | Freq: Three times a day (TID) | ORAL | Status: DC | PRN

## 2011-10-31 MED ORDER — ZOLPIDEM TARTRATE 5 MG PO TABS: 5.0000 mg | ORAL_TABLET | Freq: Every evening | ORAL | Status: DC | PRN

## 2011-10-31 NOTE — Progress Notes (Signed)
Subjective:    Patient ID: Steve Phillips, male    DOB: 1946-05-27, 65 y.o.   MRN: [id number]  HPI 65 year old male with a history of hypertension, hyperlipidemia, diabetes mellitus, CAD presents for followup visit. His primary concerns today are first , a 2 week history of intermittent diffuse headaches. These began after he started taking trazodone at night to help with sleep. He denies any trauma to his head. He denies any visual changes. He denies any nasal congestion, fever, chills. He has taken Vicodin for headache pain with improvement. He reports headache is currently resolved.   In regards to sleep, he notes that he had some improvement with trazodone but did not continue to use it because of headaches. He has tried [place] in the past with no improvement. He would like to try another medication to help with sleep. He reports that he typically falls asleep fine but then wakes in the middle of the night and is unable to go back to sleep. He notes some increased stress and anxiety related to family issues recently which exacerbated his insomnia.  In regards to his diabetes, he notes his sugars have been elevated recently, mostly near 250. This is an improvement for him. He denies any low blood sugars. He reports compliance with his medications.  Outpatient Encounter Prescriptions as of 10/31/2011  Medication Sig Dispense Refill  . amLODipine-valsartan (EXFORGE) 10-320 MG per tablet Take 1 tablet by mouth daily.        Marland Kitchen aspirin 81 MG tablet Take 81 mg by mouth daily.        Marland Kitchen atorvastatin (LIPITOR) 10 MG tablet Take 10 mg by mouth daily.        . carvedilol (COREG) 25 MG tablet Take 25 mg by mouth 2 (two) times daily.        . clopidogrel (PLAVIX) 75 MG tablet Take 75 mg by mouth daily.        . DULoxetine (CYMBALTA) 60 MG capsule Take 1 capsule (60 mg total) by mouth daily.  90 capsule  4  . furosemide (LASIX) 20 MG tablet Take 20 mg by mouth daily.        Marland Kitchen glucose blood (ACCU-CHEK  SMARTVIEW) test strip 1 each by Other route 3 (three) times daily as needed for other. 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 HYDROcodone-acetaminophen (VICODIN) 5-500 MG per tablet Take 2 tablets by mouth every 6 (six) hours as needed for pain.  60 tablet  0  . insulin aspart (NOVOLOG FLEXPEN) 100 UNIT/ML injection Inject into the skin. Sliding scale      . insulin glargine (LANTUS) 100 UNIT/ML injection Inject 80 Units into the skin 2 (two) times daily.        . isosorbide mononitrate (IMDUR) 30 MG 24 hr tablet Take 30 mg by mouth daily.        Marland Kitchen lisinopril-hydrochlorothiazide (PRINZIDE,ZESTORETIC) 20-12.5 MG per tablet Take 1 tablet by mouth 2 (two) times daily.  180 tablet  4  . metFORMIN (GLUMETZA) 1000 MG (MOD) 24 hr tablet Take 1 tablet (1,000 mg total) by mouth 2 (two) times daily.  180 tablet  4  . NITROGLYCERIN PO Take by mouth. Sublingual....specific dose is unknown       . pantoprazole (PROTONIX) 40 MG tablet Take 40 mg by mouth daily.        . Eszopiclone (ESZOPICLONE) 3 MG TABS Take 3 mg by mouth at bedtime. Take immediately before bedtime       .  hydrALAZINE (APRESOLINE) 25 MG tablet Take 1 tablet (25 mg total) by mouth 3 (three) times daily as needed (Take if blood pressure >160/100).  120 tablet  11  . zolpidem (AMBIEN) 5 MG tablet Take 1 tablet (5 mg total) by mouth at bedtime as needed for sleep.  30 tablet  0    Review of Systems  Constitutional: Negative for fever, chills, activity change, appetite change, fatigue and unexpected weight change.  Eyes: Negative for visual disturbance.  Respiratory: Negative for cough and shortness of breath.   Cardiovascular: Negative for chest pain, palpitations and leg swelling.  Gastrointestinal: Negative for abdominal pain and abdominal distention.  Genitourinary: Negative for dysuria, urgency and difficulty urinating.  Musculoskeletal: Negative for arthralgias and gait problem.  Skin: Negative for color change and rash.    Neurological: Positive for headaches.  Hematological: Negative for adenopathy.  Psychiatric/Behavioral: Positive for sleep disturbance. Negative for dysphoric mood. The patient is not nervous/anxious.    BP 178/90  Pulse 67  Temp(Src) 97.8 F (36.6 C) (Oral)  Wt 243 lb (110.224 kg)  SpO2 97%     Objective:   Physical Exam  Constitutional: He is oriented to person, place, and time. He appears well-developed and well-nourished. No distress.  HENT:  Head: Normocephalic and atraumatic.  Right Ear: External ear normal.  Left Ear: External ear normal.  Nose: Nose normal.  Mouth/Throat: Oropharynx is clear and moist. No oropharyngeal exudate.  Eyes: Conjunctivae and EOM are normal. Pupils are equal, round, and reactive to light. Right eye exhibits no discharge. Left eye exhibits no discharge. No scleral icterus.  Neck: Normal range of motion. Neck supple. No tracheal deviation present. No thyromegaly present.  Cardiovascular: Normal rate, regular rhythm and normal heart sounds.  Exam reveals no gallop and no friction rub.   No murmur heard. Pulmonary/Chest: Effort normal and breath sounds normal. No respiratory distress. He has no wheezes. He has no rales. He exhibits no tenderness.  Musculoskeletal: Normal range of motion. He exhibits no edema.  Lymphadenopathy:    He has no cervical adenopathy.  Neurological: He is alert and oriented to person, place, and time. No cranial nerve deficit. Coordination normal.  Skin: Skin is warm and dry. No rash noted. He is not diaphoretic. No erythema. No pallor.  Psychiatric: He has a normal mood and affect. His behavior is normal. Judgment and thought content normal.          Assessment & Plan:  1. Insomnia - will try Ambien. We'll start with 5 mg nightly and may advance to 10 mg nightly. He will followup in 2 weeks.  2. Headache - resolved at this point. Suspect this was related to use of trazodone. However, he also has elevated blood  pressure which may be contributing. Will continue to monitor.  3. Hypertension - blood pressure is elevated today. Will add hydralazine 25 mg by mouth 4 times daily as needed for blood pressure greater than 160/100. He will call if blood pressure is consistently elevated. He will followup in 2 weeks.  4. Diabetes - patient reports blood sugars have been elevated near 250, however this is an improvement for him. Will check hemoglobin A1c with labs.

## 2011-11-01 LAB — COMPREHENSIVE METABOLIC PANEL
ALT: 22 U/L (ref 0–53)
AST: 21 U/L (ref 0–37)
Alkaline Phosphatase: 79 U/L (ref 39–117)
BUN: 13 mg/dL (ref 6–23)
Chloride: 103 mEq/L (ref 96–112)
Creatinine, Ser: 1 mg/dL (ref 0.4–1.5)
Potassium: 4 mEq/L (ref 3.5–5.1)

## 2011-11-02 ENCOUNTER — Inpatient Hospital Stay: Payer: Medicare Other | Admitting: Internal Medicine

## 2011-11-02 ENCOUNTER — Telehealth: Payer: Self-pay | Admitting: *Deleted

## 2011-11-02 NOTE — Telephone Encounter (Signed)
Wife called, Patient was taken to the ER and admitted to the CCU for pneumonia, dehydration, elevated BP and low potasium.

## 2011-11-05 ENCOUNTER — Encounter: Payer: Self-pay | Admitting: Internal Medicine

## 2011-11-05 ENCOUNTER — Ambulatory Visit (INDEPENDENT_AMBULATORY_CARE_PROVIDER_SITE_OTHER): Payer: Medicare Other | Admitting: Internal Medicine

## 2011-11-05 VITALS — BP 140/70 | HR 65 | Temp 98.1°F | Wt 245.0 lb

## 2011-11-05 DIAGNOSIS — G4700 Insomnia, unspecified: Secondary | ICD-10-CM

## 2011-11-05 DIAGNOSIS — E236 Other disorders of pituitary gland: Secondary | ICD-10-CM

## 2011-11-05 DIAGNOSIS — R51 Headache: Secondary | ICD-10-CM

## 2011-11-05 MED ORDER — ZOLPIDEM TARTRATE 5 MG PO TABS: 10.0000 mg | ORAL_TABLET | Freq: Every evening | ORAL | Status: DC | PRN

## 2011-11-05 NOTE — Progress Notes (Signed)
Subjective:    Patient ID: Steve Phillips, male    DOB: [birth year], 65 y.o.   MRN: [id number]  HPI 65YO male with CAD, DM, HTN presents for follow up after hospital admission last week. Reports he was feeling well until last Thursday when out to dinner with family members, he became somnolent, confused. Ultimately had to leave restaurant in wheelchair, taken to ED. In ED was hypoxic, hypertension, febrile. Diagnosed with pneumonia and treated with Rocephin and Azithromycin, however CXR was normal. Had extremely bad headache during this time, so had CT head which showed possible pituitary mass. MRI brain was performed which showed no abnormalities. He was told, however that he would need to follow up with neurosurgery and endocrinology.  He was also noted to have cellulitis on his left lower leg.  He was discharged home on Levaquin and Clindamycin for this. He has noted some improvement in his redness and pain since that time. Denies further fever or chills. Denies cough, dyspnea, or chest pain. Has persistent fatigue and some headache, but improved compared to earlier.  Outpatient Encounter Prescriptions as of 11/05/2011  Medication Sig Dispense Refill  . amLODipine-valsartan (EXFORGE) 10-320 MG per tablet Take 1 tablet by mouth daily.        Marland Kitchen aspirin 81 MG tablet Take 81 mg by mouth daily.        Marland Kitchen atorvastatin (LIPITOR) 10 MG tablet Take 10 mg by mouth daily.        . carvedilol (COREG) 25 MG tablet Take 25 mg by mouth 2 (two) times daily.        . clopidogrel (PLAVIX) 75 MG tablet Take 75 mg by mouth daily.        . DULoxetine (CYMBALTA) 60 MG capsule Take 1 capsule (60 mg total) by mouth daily.  90 capsule  4  . Eszopiclone (ESZOPICLONE) 3 MG TABS Take 3 mg by mouth at bedtime. Take immediately before bedtime       . furosemide (LASIX) 20 MG tablet Take 20 mg by mouth daily.        Marland Kitchen glucose blood (ACCU-CHEK SMARTVIEW) test strip 1 each by Other route 3 (three) times daily as needed for other.  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 hydrALAZINE (APRESOLINE) 25 MG tablet Take 1 tablet (25 mg total) by mouth 3 (three) times daily as needed (Take if blood pressure >160/100).  120 tablet  11  . HYDROcodone-acetaminophen (VICODIN) 5-500 MG per tablet Take 2 tablets by mouth every 6 (six) hours as needed for pain.  60 tablet  0  . insulin aspart (NOVOLOG FLEXPEN) 100 UNIT/ML injection Inject into the skin. Sliding scale      . insulin glargine (LANTUS) 100 UNIT/ML injection Inject 80 Units into the skin 2 (two) times daily.        . isosorbide mononitrate (IMDUR) 30 MG 24 hr tablet Take 30 mg by mouth daily.        Marland Kitchen lisinopril-hydrochlorothiazide (PRINZIDE,ZESTORETIC) 20-12.5 MG per tablet Take 1 tablet by mouth 2 (two) times daily.  180 tablet  4  . metFORMIN (GLUMETZA) 1000 MG (MOD) 24 hr tablet Take 1 tablet (1,000 mg total) by mouth 2 (two) times daily.  180 tablet  4  . NITROGLYCERIN PO Take by mouth. Sublingual....specific dose is unknown       . oxyCODONE-acetaminophen (PERCOCET) 5-325 MG per tablet Take 1 tablet by mouth every 4 (four) hours as needed.        Marland Kitchen  pantoprazole (PROTONIX) 40 MG tablet Take 40 mg by mouth daily.        Marland Kitchen zolpidem (AMBIEN) 5 MG tablet Take 2 tablets (10 mg total) by mouth at bedtime as needed for sleep.  60 tablet  0    Review of Systems  Constitutional: Positive for fatigue. Negative for fever, chills, activity change, appetite change and unexpected weight change.  Eyes: Negative for visual disturbance.  Respiratory: Negative for cough and shortness of breath.   Cardiovascular: Positive for leg swelling. Negative for chest pain and palpitations.  Gastrointestinal: Negative for abdominal pain and abdominal distention.  Genitourinary: Negative for dysuria, urgency and difficulty urinating.  Musculoskeletal: Negative for arthralgias and gait problem.  Skin: Positive for color change. Negative for rash.  Neurological: Positive for headaches.    Hematological: Negative for adenopathy.  Psychiatric/Behavioral: Negative for sleep disturbance and dysphoric mood. The patient is not nervous/anxious.    BP 140/70  Pulse 65  Temp(Src) 98.1 F (36.7 C) (Oral)  Wt 245 lb (111.131 kg)  SpO2 97%     Objective:   Physical Exam  Constitutional: He is oriented to person, place, and time. He appears well-developed and well-nourished. No distress.  HENT:  Head: Normocephalic and atraumatic.  Right Ear: External ear normal.  Left Ear: External ear normal.  Nose: Nose normal.  Mouth/Throat: Oropharynx is clear and moist. No oropharyngeal exudate.  Eyes: Conjunctivae and EOM are normal. Pupils are equal, round, and reactive to light. Right eye exhibits no discharge. Left eye exhibits no discharge. No scleral icterus.  Neck: Normal range of motion. Neck supple. No tracheal deviation present. No thyromegaly present.  Cardiovascular: Normal rate, regular rhythm and normal heart sounds.  Exam reveals no gallop and no friction rub.   No murmur heard. Pulmonary/Chest: Effort normal and breath sounds normal. No respiratory distress. He has no wheezes. He has no rales. He exhibits no tenderness.  Musculoskeletal: Normal range of motion. He exhibits no edema.  Lymphadenopathy:    He has no cervical adenopathy.  Neurological: He is alert and oriented to person, place, and time. No cranial nerve deficit. Coordination normal.  Skin: Skin is warm and dry. No rash noted. He is not diaphoretic. There is erythema. No pallor.     Psychiatric: He has a normal mood and affect. His behavior is normal. Judgment and thought content normal.          Assessment & Plan:  1. Cellulitis - Improving on Levaquin and Clindamycin. Question if this was cause of initial sepsis/shock, as no evidence of pneumonia and no residual dyspnea or cough. Will continue to monitor. He will call immediately if any fever, chills, or progression of induration  2. Headache -  Unclear etiology. Suspect HTN played role. Current use of percocet for cellulitis may also be making symptoms worse. CT showed possible pituitary mass, but MRI was normal. Will set him up with neurology, as HA symptoms have been persistent and are very bothersome to him. Question if topamax might be helpful in this case?  3. Pituitary enlargement - Unclear etiology. Pt was told he needed neurosurgery and endocrine follow up while in hospital. Will set up endocrine evaluation. Question if any further workup is necessary given MRI was essentially normal.

## 2011-11-08 ENCOUNTER — Ambulatory Visit (INDEPENDENT_AMBULATORY_CARE_PROVIDER_SITE_OTHER): Payer: Medicare Other | Admitting: Neurology

## 2011-11-08 ENCOUNTER — Other Ambulatory Visit: Payer: Self-pay | Admitting: Neurology

## 2011-11-08 ENCOUNTER — Other Ambulatory Visit (INDEPENDENT_AMBULATORY_CARE_PROVIDER_SITE_OTHER): Payer: Medicare Other

## 2011-11-08 ENCOUNTER — Encounter: Payer: Self-pay | Admitting: Neurology

## 2011-11-08 VITALS — BP 162/86 | HR 76 | Wt 248.0 lb

## 2011-11-08 DIAGNOSIS — R51 Headache: Secondary | ICD-10-CM

## 2011-11-08 DIAGNOSIS — G609 Hereditary and idiopathic neuropathy, unspecified: Secondary | ICD-10-CM

## 2011-11-08 DIAGNOSIS — F329 Major depressive disorder, single episode, unspecified: Secondary | ICD-10-CM

## 2011-11-08 DIAGNOSIS — E119 Type 2 diabetes mellitus without complications: Secondary | ICD-10-CM

## 2011-11-08 LAB — VITAMIN B12: Vitamin B-12: 278 pg/mL (ref 211–911)

## 2011-11-08 MED ORDER — NORTRIPTYLINE HCL 25 MG PO CAPS: 25.0000 mg | ORAL_CAPSULE | Freq: Every day | ORAL | Status: DC
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
## 2011-11-08 NOTE — Patient Instructions (Signed)
Go to the basement to have your labs drawn today.  Your prescription has been sent to your pharmacy.  Your follow up appointment with Dr. Modesto Charon is Feb., 1st at 1000 am.

## 2011-11-12 LAB — SPEP & IFE WITH QIG
Albumin ELP: 53.6 % — ABNORMAL LOW (ref 55.8–66.1)
Alpha-1-Globulin: 5.8 % — ABNORMAL HIGH (ref 2.9–4.9)
Alpha-2-Globulin: 18 % — ABNORMAL HIGH (ref 7.1–11.8)
Beta 2: 6.7 % — ABNORMAL HIGH (ref 3.2–6.5)
Gamma Globulin: 8.7 % — ABNORMAL LOW (ref 11.1–18.8)
IgM, Serum: 30 mg/dL — ABNORMAL LOW (ref 41–251)

## 2011-11-16 ENCOUNTER — Encounter: Payer: Self-pay | Admitting: Internal Medicine

## 2011-11-16 ENCOUNTER — Ambulatory Visit (INDEPENDENT_AMBULATORY_CARE_PROVIDER_SITE_OTHER): Payer: Medicare Other | Admitting: Internal Medicine

## 2011-11-16 VITALS — BP 160/80 | HR 69 | Temp 97.6°F | Wt 249.0 lb

## 2011-11-16 DIAGNOSIS — E785 Hyperlipidemia, unspecified: Secondary | ICD-10-CM

## 2011-11-16 DIAGNOSIS — I10 Essential (primary) hypertension: Secondary | ICD-10-CM

## 2011-11-16 DIAGNOSIS — G4700 Insomnia, unspecified: Secondary | ICD-10-CM

## 2011-11-16 DIAGNOSIS — R51 Headache: Secondary | ICD-10-CM

## 2011-11-16 DIAGNOSIS — E119 Type 2 diabetes mellitus without complications: Secondary | ICD-10-CM

## 2011-11-16 MED ORDER — LISINOPRIL-HYDROCHLOROTHIAZIDE 20-12.5 MG PO TABS: 1.0000 | ORAL_TABLET | Freq: Two times a day (BID) | ORAL | Status: DC

## 2011-11-16 MED ORDER — BUTALBITAL-ACETAMINOPHEN 50-325 MG PO TABS: 1.0000 | ORAL_TABLET | Freq: Two times a day (BID) | ORAL | Status: DC | PRN

## 2011-11-16 MED ORDER — ESZOPICLONE 3 MG PO TABS: 3.0000 mg | ORAL_TABLET | Freq: Every day | ORAL | Status: DC

## 2011-11-16 NOTE — Patient Instructions (Signed)
Start Butalbital twice daily as needed for headache.  Start Lunesta at bedtime as needed for sleep.  Follow up 2 weeks.

## 2011-11-16 NOTE — Progress Notes (Signed)
Subjective:    Patient ID: Steve Phillips, male    DOB: 05-25-1946, 65 y.o.   MRN: [id number]  HPI  65 year old male with a history of diabetes, hypertension, hyperlipidemia, and a recent history of headaches presents for followup. He reports that he was recently seen by neurologist and was started on nortriptyline. He reports that the nortriptyline seems to worsen his headaches. He reports that the headaches are occurring on a daily basis. They're described as diffuse and a sense of tightening around his head. He gets minimal improvement with hydrocodone. The headaches were also worsened with the use of trazodone. He denies any nausea, visual changes with his headaches. The headaches have limited his ability to sleep.  He also complains of chronic insomnia. He has tried several medications for this including trazodone, which worsened his headaches, and Ambien which provided little benefit. He typically goes to bed around 12:30 at night. He sleeps very little, maybe a couple of hours. He is then exhausted during the day and frequently falls asleep during the day. As above, he reports that the headache pain has limited his ability to sleep even more than baseline.   Outpatient Encounter Prescriptions as of 11/16/2011  Medication Sig Dispense Refill  . amLODipine-valsartan (EXFORGE) 10-320 MG per tablet Take 1 tablet by mouth daily.        Marland Kitchen aspirin 81 MG tablet Take 81 mg by mouth daily.        Marland Kitchen atorvastatin (LIPITOR) 10 MG tablet Take 10 mg by mouth daily.        . carvedilol (COREG) 25 MG tablet Take 25 mg by mouth 2 (two) times daily.        . clopidogrel (PLAVIX) 75 MG tablet Take 75 mg by mouth daily.        . DULoxetine (CYMBALTA) 60 MG capsule Take 1 capsule (60 mg total) by mouth daily.  90 capsule  4  . furosemide (LASIX) 20 MG tablet Take 20 mg by mouth daily.        Marland Kitchen glucose blood (ACCU-CHEK SMARTVIEW) test strip 1 each by Other route 3 (three) times daily as needed for other. DX:  250.00 Insulin dependent  100 each  12  . hydrALAZINE (APRESOLINE) 25 MG tablet Take 1 tablet (25 mg total) by mouth 3 (three) times daily as needed (Take if blood pressure >160/100).  120 tablet  11  . HYDROcodone-acetaminophen (VICODIN) 5-500 MG per tablet Take 2 tablets by mouth every 6 (six) hours as needed for pain.  60 tablet  0  . insulin aspart (NOVOLOG FLEXPEN) 100 UNIT/ML injection Inject into the skin. Sliding scale      . insulin glargine (LANTUS) 100 UNIT/ML injection Inject 80 Units into the skin 2 (two) times daily.        . isosorbide mononitrate (IMDUR) 30 MG 24 hr tablet Take 30 mg by mouth daily.        Marland Kitchen lisinopril-hydrochlorothiazide (PRINZIDE,ZESTORETIC) 20-12.5 MG per tablet Take 1 tablet by mouth 2 (two) times daily.  180 tablet  4  . metFORMIN (GLUMETZA) 1000 MG (MOD) 24 hr tablet Take 1 tablet (1,000 mg total) by mouth 2 (two) times daily.  180 tablet  4  . NITROGLYCERIN PO Take by mouth. Sublingual....specific dose is unknown       . oxyCODONE-acetaminophen (PERCOCET) 5-325 MG per tablet Take 1 tablet by mouth every 4 (four) hours as needed.        . pantoprazole (PROTONIX) 40 MG tablet Take  40 mg by mouth daily.        . ACETAMINOPHEN-BUTALBITAL 50-325 MG TABS Take 1 tablet by mouth 2 (two) times daily as needed.  120 each  0  . Eszopiclone (ESZOPICLONE) 3 MG TABS Take 1 tablet (3 mg total) by mouth at bedtime. Take immediately before bedtime  30 tablet  0  . nortriptyline (PAMELOR) 25 MG capsule Take 1 capsule (25 mg total) by mouth at bedtime.  30 capsule  3    Review of Systems  Constitutional: Negative for fever, chills, activity change, appetite change, fatigue and unexpected weight change.  Eyes: Negative for visual disturbance.  Respiratory: Negative for cough and shortness of breath.   Cardiovascular: Positive for leg swelling. Negative for chest pain and palpitations.  Gastrointestinal: Negative for abdominal pain and abdominal distention.  Genitourinary:  Negative for dysuria, urgency and difficulty urinating.  Musculoskeletal: Negative for arthralgias and gait problem.  Skin: Negative for color change and rash.  Neurological: Positive for headaches.  Hematological: Negative for adenopathy.  Psychiatric/Behavioral: Positive for sleep disturbance. Negative for dysphoric mood. The patient is not nervous/anxious.    BP 160/80  Pulse 69  Temp(Src) 97.6 F (36.4 C) (Oral)  Wt 249 lb (112.946 kg)  SpO2 97%     Objective:   Physical Exam  Constitutional: He is oriented to person, place, and time. He appears well-developed and well-nourished. No distress.  HENT:  Head: Normocephalic and atraumatic.  Right Ear: External ear normal.  Left Ear: External ear normal.  Nose: Nose normal.  Mouth/Throat: Oropharynx is clear and moist. No oropharyngeal exudate.  Eyes: Conjunctivae and EOM are normal. Pupils are equal, round, and reactive to light. Right eye exhibits no discharge. Left eye exhibits no discharge. No scleral icterus.  Neck: Normal range of motion. Neck supple. No tracheal deviation present. No thyromegaly present.  Cardiovascular: Normal rate, regular rhythm and normal heart sounds.  Exam reveals no gallop and no friction rub.   No murmur heard. Pulmonary/Chest: Effort normal and breath sounds normal. No respiratory distress. He has no wheezes. He has no rales. He exhibits no tenderness.  Musculoskeletal: Normal range of motion. He exhibits edema.  Lymphadenopathy:    He has no cervical adenopathy.  Neurological: He is alert and oriented to person, place, and time. No cranial nerve deficit. Coordination normal.  Skin: Skin is warm and dry. No rash noted. He is not diaphoretic. No erythema. No pallor.  Psychiatric: He has a normal mood and affect. His behavior is normal. Judgment and thought content normal.          Assessment & Plan:  1. Insomnia - will try Lunesta. He had some benefit with this in the past. We'll start with 3  mg nightly. He will followup in 2 weeks. If no improvement, will set up with sleep specialist.  2. Headache - Persistent. Evaluation by neurologist suggested migraines. He did not have improvement with pamelor. His description of headaches today suggest a tension component. Will try adding butalbital. We discussed the potential risk of this medication including sedation. He will not drive will using this medicine. He will followup in 2 weeks. He will call sooner if symptoms are persistent.  3. Hypertension - blood pressure is elevated today. Suspect that his chronic hypertension is made worse by headache and lack of sleep. Will continue current medications for now and address pain and sleep as above. He will followup in 2 weeks.

## 2011-11-21 DIAGNOSIS — E1149 Type 2 diabetes mellitus with other diabetic neurological complication: Secondary | ICD-10-CM | POA: Diagnosis not present

## 2011-11-21 DIAGNOSIS — E1142 Type 2 diabetes mellitus with diabetic polyneuropathy: Secondary | ICD-10-CM | POA: Diagnosis not present

## 2011-11-21 DIAGNOSIS — L97509 Non-pressure chronic ulcer of other part of unspecified foot with unspecified severity: Secondary | ICD-10-CM | POA: Diagnosis not present

## 2011-11-21 NOTE — Progress Notes (Signed)
Reviewed MRI brain done at [facility].  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  Large pituitary looked unremarkable to me on MRI brain.  It did look hyperdense on CT - ?blood, but that would be unusual in his age group.

## 2011-11-27 DIAGNOSIS — F339 Major depressive disorder, recurrent, unspecified: Secondary | ICD-10-CM | POA: Diagnosis not present

## 2011-11-28 DIAGNOSIS — L97509 Non-pressure chronic ulcer of other part of unspecified foot with unspecified severity: Secondary | ICD-10-CM | POA: Diagnosis not present

## 2011-11-29 ENCOUNTER — Emergency Department: Payer: Self-pay | Admitting: Unknown Physician Specialty

## 2011-11-29 DIAGNOSIS — R197 Diarrhea, unspecified: Secondary | ICD-10-CM | POA: Diagnosis not present

## 2011-11-29 DIAGNOSIS — I10 Essential (primary) hypertension: Secondary | ICD-10-CM | POA: Diagnosis not present

## 2011-11-29 DIAGNOSIS — R1110 Vomiting, unspecified: Secondary | ICD-10-CM | POA: Diagnosis not present

## 2011-11-29 DIAGNOSIS — K219 Gastro-esophageal reflux disease without esophagitis: Secondary | ICD-10-CM | POA: Diagnosis not present

## 2011-11-29 DIAGNOSIS — E860 Dehydration: Secondary | ICD-10-CM | POA: Diagnosis not present

## 2011-11-29 DIAGNOSIS — Z79899 Other long term (current) drug therapy: Secondary | ICD-10-CM | POA: Diagnosis not present

## 2011-11-29 DIAGNOSIS — E119 Type 2 diabetes mellitus without complications: Secondary | ICD-10-CM | POA: Diagnosis not present

## 2011-11-29 LAB — URINALYSIS, COMPLETE
Bacteria: NONE SEEN
Glucose,UR: >=500 mg/dL (ref 0–75)
Leukocyte Esterase: NEGATIVE
Nitrite: NEGATIVE
RBC,UR: NONE SEEN /HPF (ref 0–5)
WBC UR: 1 /HPF (ref 0–5)

## 2011-11-29 LAB — CBC
HCT: 53.6 % — ABNORMAL HIGH (ref 40.0–52.0)
HGB: 17.3 g/dL (ref 13.0–18.0)
MCHC: 32.3 g/dL (ref 32.0–36.0)
MCV: 75 fL — ABNORMAL LOW (ref 80–100)
RBC: 7.19 10*6/uL — ABNORMAL HIGH (ref 4.40–5.90)
WBC: 14.2 10*3/uL — ABNORMAL HIGH (ref 3.8–10.6)

## 2011-11-29 LAB — COMPREHENSIVE METABOLIC PANEL
Albumin: 3.5 g/dL (ref 3.4–5.0)
BUN: 16 mg/dL (ref 7–18)
Bilirubin,Total: 0.8 mg/dL (ref 0.2–1.0)
Calcium, Total: 8.9 mg/dL (ref 8.5–10.1)
Chloride: 99 mmol/L (ref 98–107)
Creatinine: 0.93 mg/dL (ref 0.60–1.30)
EGFR (African American): >60
EGFR (Non-African Amer.): >60
Glucose: 406 mg/dL — ABNORMAL HIGH (ref 65–99)
Osmolality: 285 (ref 275–301)
Potassium: 3.8 mmol/L (ref 3.5–5.1)
SGOT(AST): 15 U/L (ref 15–37)
SGPT (ALT): 26 U/L
Total Protein: 7.3 g/dL (ref 6.4–8.2)

## 2011-11-29 LAB — RAPID INFLUENZA A&B ANTIGENS

## 2011-11-30 ENCOUNTER — Telehealth: Payer: Self-pay | Admitting: *Deleted

## 2011-11-30 DIAGNOSIS — R947 Abnormal results of other endocrine function studies: Secondary | ICD-10-CM | POA: Diagnosis not present

## 2011-11-30 NOTE — Telephone Encounter (Signed)
Spoke w/wife -   1. Pt went to the ER Wednesday for nausea, vomiting and dehydration. He is beginning to feel better and has apt Monday w/Dr Dan Humphreys.  2. Needs diabetic shoes. She will contact company and have them fax required forms to our office.

## 2011-12-03 ENCOUNTER — Ambulatory Visit: Payer: Medicare Other | Admitting: Internal Medicine

## 2011-12-03 DIAGNOSIS — R947 Abnormal results of other endocrine function studies: Secondary | ICD-10-CM | POA: Diagnosis not present

## 2011-12-17 ENCOUNTER — Ambulatory Visit: Payer: Medicare Other | Admitting: Neurology

## 2011-12-18 ENCOUNTER — Encounter: Payer: Self-pay | Admitting: Internal Medicine

## 2011-12-18 ENCOUNTER — Ambulatory Visit (INDEPENDENT_AMBULATORY_CARE_PROVIDER_SITE_OTHER): Payer: Medicare Other | Admitting: Internal Medicine

## 2011-12-18 DIAGNOSIS — E1169 Type 2 diabetes mellitus with other specified complication: Secondary | ICD-10-CM | POA: Diagnosis not present

## 2011-12-18 DIAGNOSIS — L97509 Non-pressure chronic ulcer of other part of unspecified foot with unspecified severity: Secondary | ICD-10-CM

## 2011-12-18 DIAGNOSIS — I10 Essential (primary) hypertension: Secondary | ICD-10-CM | POA: Diagnosis not present

## 2011-12-18 DIAGNOSIS — R51 Headache: Secondary | ICD-10-CM

## 2011-12-18 DIAGNOSIS — L98499 Non-pressure chronic ulcer of skin of other sites with unspecified severity: Secondary | ICD-10-CM

## 2011-12-18 DIAGNOSIS — E1149 Type 2 diabetes mellitus with other diabetic neurological complication: Secondary | ICD-10-CM | POA: Diagnosis not present

## 2011-12-18 DIAGNOSIS — E119 Type 2 diabetes mellitus without complications: Secondary | ICD-10-CM

## 2011-12-18 DIAGNOSIS — G909 Disorder of the autonomic nervous system, unspecified: Secondary | ICD-10-CM | POA: Diagnosis not present

## 2011-12-18 DIAGNOSIS — Z794 Long term (current) use of insulin: Secondary | ICD-10-CM | POA: Diagnosis not present

## 2011-12-18 DIAGNOSIS — E11621 Type 2 diabetes mellitus with foot ulcer: Secondary | ICD-10-CM | POA: Insufficient documentation

## 2011-12-18 LAB — COMPREHENSIVE METABOLIC PANEL
Alkaline Phosphatase: 66 U/L (ref 39–117)
CO2: 24 mEq/L (ref 19–32)
Creatinine, Ser: 1 mg/dL (ref 0.4–1.5)
GFR: 82.92 mL/min (ref >60.00)
Glucose, Bld: 92 mg/dL (ref 70–99)
Sodium: 143 mEq/L (ref 135–145)
Total Bilirubin: 0.4 mg/dL (ref 0.3–1.2)

## 2011-12-18 LAB — HEMOGLOBIN A1C: Hgb A1c MFr Bld: 9.7 % — ABNORMAL HIGH (ref 4.6–6.5)

## 2011-12-18 LAB — MAGNESIUM: Magnesium: 1.7 mg/dL (ref 1.5–2.5)

## 2011-12-18 MED ORDER — INSULIN GLARGINE 100 UNIT/ML ~~LOC~~ SOLN: 60.0000 [IU] | Freq: Two times a day (BID) | SUBCUTANEOUS | Status: DC

## 2011-12-18 NOTE — Patient Instructions (Signed)
Caren Macadam - Nutritionist

## 2011-12-18 NOTE — Assessment & Plan Note (Signed)
Headaches have recently improved without further intervention. Will continue to monitor. Patient has followup with neurology.

## 2011-12-18 NOTE — Assessment & Plan Note (Signed)
Blood sugars currently very well controlled. Patient is following a strict diet with limited carbohydrate intake. Encouraged him to continue with diet and exercise. We'll have him decrease his dose of Lantus to 60 units. He will record blood sugars twice daily and call if any blood sugars greater than 250 or less than 80. He will followup in one month.

## 2011-12-18 NOTE — Assessment & Plan Note (Signed)
Blood pressure very well controlled. Will continue current medications. Encouraged patient to continue efforts at diet and exercise. Followup in one month.

## 2011-12-18 NOTE — Assessment & Plan Note (Signed)
Wound appears to be healing well. He will continue to follow with podiatry for debridement.

## 2011-12-18 NOTE — Progress Notes (Signed)
Subjective:    Patient ID: Steve Phillips, male    DOB: [birth year], 66 y.o.   MRN: [id number]  HPI 66 year old male with history of coronary artery disease, diabetes, hypertension, depression, and recent episode of headaches presents for followup. In regards to his diabetes, he notes that he has been following a diet which is low in processed carbohydrates and high in fresh fruits and vegetables. He has lost approximately 5 pounds since his last visit one month ago. He reports his blood sugars have been much better controlled, typically running between 80 and 200. He has had 2 episodes of low blood sugar less than 80 which were symptomatic with sweating and lightheadedness. These resolved with eating. He has also been exercising by walking approximately 2-3 miles daily. He reports that he is generally feeling very well.  In regards to his hypertension, he reports his blood pressure is been well controlled. He reports full compliance with his medications. He denies any chest pain, palpitations, headache.  In regards to his recent headaches, he reports over the last 2 weeks he has had no further headaches. He has not added any new medications.  He also notes that he has been followed by podiatry for a lesion on his left foot which has been debrided several times. He reports that there is been some discussion about putting him in a cast to help allow the wound to heal. He denies any fever or chills. He denies any pain at the wound site.  Outpatient Encounter Prescriptions as of 12/18/2011  Medication Sig Dispense Refill  . ACETAMINOPHEN-BUTALBITAL 50-325 MG TABS Take 1 tablet by mouth 2 (two) times daily as needed.  120 each  0  . amLODipine-valsartan (EXFORGE) 10-320 MG per tablet Take 1 tablet by mouth daily.        Marland Kitchen aspirin 81 MG tablet Take 81 mg by mouth daily.        Marland Kitchen atorvastatin (LIPITOR) 10 MG tablet Take 10 mg by mouth daily.        . carvedilol (COREG) 25 MG tablet Take 25 mg by mouth 2  (two) times daily.        . clopidogrel (PLAVIX) 75 MG tablet Take 75 mg by mouth daily.        . DULoxetine (CYMBALTA) 60 MG capsule Take 1 capsule (60 mg total) by mouth daily.  90 capsule  4  . Eszopiclone (ESZOPICLONE) 3 MG TABS Take 1 tablet (3 mg total) by mouth at bedtime. Take immediately before bedtime  30 tablet  0  . furosemide (LASIX) 20 MG tablet Take 20 mg by mouth daily.        Marland Kitchen glucose blood (ACCU-CHEK SMARTVIEW) test strip 1 each by Other route 3 (three) times daily as needed for other. 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 hydrALAZINE (APRESOLINE) 25 MG tablet Take 1 tablet (25 mg total) by mouth 3 (three) times daily as needed (Take if blood pressure >160/100).  120 tablet  11  . HYDROcodone-acetaminophen (VICODIN) 5-500 MG per tablet Take 2 tablets by mouth every 6 (six) hours as needed for pain.  60 tablet  0  . insulin aspart (NOVOLOG FLEXPEN) 100 UNIT/ML injection Inject into the skin. Sliding scale      . insulin glargine (LANTUS) 100 UNIT/ML injection Inject 60 Units into the skin 2 (two) times daily.  10 mL  3  . isosorbide mononitrate (IMDUR) 30 MG 24 hr tablet Take 30 mg by mouth daily.        Marland Kitchen  lisinopril-hydrochlorothiazide (PRINZIDE,ZESTORETIC) 20-12.5 MG per tablet Take 1 tablet by mouth 2 (two) times daily.  180 tablet  4  . metFORMIN (GLUMETZA) 1000 MG (MOD) 24 hr tablet Take 1 tablet (1,000 mg total) by mouth 2 (two) times daily.  180 tablet  4  . NITROGLYCERIN PO Take by mouth. Sublingual....specific dose is unknown       . nortriptyline (PAMELOR) 25 MG capsule Take 1 capsule (25 mg total) by mouth at bedtime.  30 capsule  3  . oxyCODONE-acetaminophen (PERCOCET) 5-325 MG per tablet Take 1 tablet by mouth every 4 (four) hours as needed.        . pantoprazole (PROTONIX) 40 MG tablet Take 40 mg by mouth daily.        Marland Kitchen DISCONTD: insulin glargine (LANTUS) 100 UNIT/ML injection Inject 80 Units into the skin 2 (two) times daily.          Review of Systems    Constitutional: Negative for fever, chills, activity change, appetite change, fatigue and unexpected weight change.  Eyes: Negative for visual disturbance.  Respiratory: Negative for cough and shortness of breath.   Cardiovascular: Negative for chest pain, palpitations and leg swelling.  Gastrointestinal: Negative for abdominal pain and abdominal distention.  Genitourinary: Negative for dysuria, urgency and difficulty urinating.  Musculoskeletal: Negative for arthralgias and gait problem.  Skin: Negative for color change and rash.  Hematological: Negative for adenopathy.  Psychiatric/Behavioral: Negative for sleep disturbance and dysphoric mood. The patient is not nervous/anxious.    BP 120/68  Pulse 73  Temp(Src) 97.8 F (36.6 C) (Oral)  Ht 6' (1.829 m)  Wt 245 lb (111.131 kg)  BMI 33.23 kg/m2  SpO2 98%     Objective:   Physical Exam  Constitutional: He is oriented to person, place, and time. He appears well-developed and well-nourished. No distress.  HENT:  Head: Normocephalic and atraumatic.  Right Ear: External ear normal.  Left Ear: External ear normal.  Nose: Nose normal.  Mouth/Throat: Oropharynx is clear and moist. No oropharyngeal exudate.  Eyes: Conjunctivae and EOM are normal. Pupils are equal, round, and reactive to light. Right eye exhibits no discharge. Left eye exhibits no discharge. No scleral icterus.  Neck: Normal range of motion. Neck supple. No tracheal deviation present. No thyromegaly present.  Cardiovascular: Normal rate, regular rhythm and normal heart sounds.  Exam reveals no gallop and no friction rub.   No murmur heard. Pulmonary/Chest: Effort normal and breath sounds normal. No respiratory distress. He has no wheezes. He has no rales. He exhibits no tenderness.  Musculoskeletal: Normal range of motion. He exhibits no edema.  Lymphadenopathy:    He has no cervical adenopathy.  Neurological: He is alert and oriented to person, place, and time. No  cranial nerve deficit. Coordination normal.  Skin: Skin is warm and dry. No rash noted. He is not diaphoretic. No erythema. No pallor.     Psychiatric: He has a normal mood and affect. His behavior is normal. Judgment and thought content normal.          Assessment & Plan:

## 2011-12-19 DIAGNOSIS — E1149 Type 2 diabetes mellitus with other diabetic neurological complication: Secondary | ICD-10-CM | POA: Diagnosis not present

## 2011-12-19 DIAGNOSIS — E1142 Type 2 diabetes mellitus with diabetic polyneuropathy: Secondary | ICD-10-CM | POA: Diagnosis not present

## 2011-12-19 DIAGNOSIS — L97509 Non-pressure chronic ulcer of other part of unspecified foot with unspecified severity: Secondary | ICD-10-CM | POA: Diagnosis not present

## 2011-12-21 ENCOUNTER — Ambulatory Visit: Payer: Medicare Other | Admitting: Neurology

## 2011-12-26 DIAGNOSIS — L97509 Non-pressure chronic ulcer of other part of unspecified foot with unspecified severity: Secondary | ICD-10-CM | POA: Diagnosis not present

## 2011-12-26 DIAGNOSIS — E1149 Type 2 diabetes mellitus with other diabetic neurological complication: Secondary | ICD-10-CM | POA: Diagnosis not present

## 2011-12-26 DIAGNOSIS — E1142 Type 2 diabetes mellitus with diabetic polyneuropathy: Secondary | ICD-10-CM | POA: Diagnosis not present

## 2011-12-31 DIAGNOSIS — E1149 Type 2 diabetes mellitus with other diabetic neurological complication: Secondary | ICD-10-CM | POA: Diagnosis not present

## 2011-12-31 DIAGNOSIS — L97509 Non-pressure chronic ulcer of other part of unspecified foot with unspecified severity: Secondary | ICD-10-CM | POA: Diagnosis not present

## 2011-12-31 DIAGNOSIS — E1142 Type 2 diabetes mellitus with diabetic polyneuropathy: Secondary | ICD-10-CM | POA: Diagnosis not present

## 2012-01-14 ENCOUNTER — Telehealth: Payer: Self-pay | Admitting: Internal Medicine

## 2012-01-14 ENCOUNTER — Encounter: Payer: Self-pay | Admitting: Internal Medicine

## 2012-01-14 ENCOUNTER — Ambulatory Visit (INDEPENDENT_AMBULATORY_CARE_PROVIDER_SITE_OTHER): Payer: Medicare Other | Admitting: Internal Medicine

## 2012-01-14 ENCOUNTER — Ambulatory Visit: Payer: Self-pay | Admitting: Internal Medicine

## 2012-01-14 DIAGNOSIS — I10 Essential (primary) hypertension: Secondary | ICD-10-CM

## 2012-01-14 DIAGNOSIS — S069X9A Unspecified intracranial injury with loss of consciousness of unspecified duration, initial encounter: Secondary | ICD-10-CM

## 2012-01-14 DIAGNOSIS — S060X9A Concussion with loss of consciousness of unspecified duration, initial encounter: Secondary | ICD-10-CM

## 2012-01-14 DIAGNOSIS — N509 Disorder of male genital organs, unspecified: Secondary | ICD-10-CM

## 2012-01-14 DIAGNOSIS — L97509 Non-pressure chronic ulcer of other part of unspecified foot with unspecified severity: Secondary | ICD-10-CM | POA: Diagnosis not present

## 2012-01-14 DIAGNOSIS — R51 Headache: Secondary | ICD-10-CM | POA: Diagnosis not present

## 2012-01-14 DIAGNOSIS — E119 Type 2 diabetes mellitus without complications: Secondary | ICD-10-CM

## 2012-01-14 DIAGNOSIS — N50812 Left testicular pain: Secondary | ICD-10-CM

## 2012-01-14 DIAGNOSIS — G9389 Other specified disorders of brain: Secondary | ICD-10-CM | POA: Diagnosis not present

## 2012-01-14 MED ORDER — HYDROCODONE-ACETAMINOPHEN 5-500 MG PO TABS: 2.0000 | ORAL_TABLET | Freq: Three times a day (TID) | ORAL | Status: DC | PRN

## 2012-01-14 MED ORDER — HYDRALAZINE HCL 100 MG PO TABS: 100.0000 mg | ORAL_TABLET | Freq: Three times a day (TID) | ORAL | Status: DC

## 2012-01-14 NOTE — Telephone Encounter (Signed)
CT head was normal

## 2012-01-14 NOTE — Patient Instructions (Signed)
Increase Hydralazine to 100mg  three times daily.  Return to clinic 2-3 days for blood pressure check.  Follow up 1 month.

## 2012-01-15 DIAGNOSIS — S069X9A Unspecified intracranial injury with loss of consciousness of unspecified duration, initial encounter: Secondary | ICD-10-CM | POA: Insufficient documentation

## 2012-01-15 NOTE — Progress Notes (Signed)
Subjective:    Patient ID: Steve Phillips, male    DOB: February 29, 1946, 66 y.o.   MRN: [id number]  HPI 66 year old male with history of diabetes, chronic headaches, and hypertension presents for followup. In regards to his headaches, he notes recent worsening of his headaches after a fall approximately one week ago in which she fell hitting the right side of his head on a metal frame. He reports that he lost consciousness and woke up on the ground an unknown amount of time later. After that injury, he reports significant nausea for several days. He has also had severe right-sided headache since that time. He has been taking 10 mg of hydrocodone every 8 hours with minimal improvement in his pain. His nausea has resolved. He also notes some difficulty with his balance. However, he attributes this to recent placement of total contact cast for his diabetic foot ulcer.  In regards to his diabetes, he reports full compliance with his medications. He did not bring a record of his blood sugars today. He reports his blood sugars have been fairly well controlled with no sugars greater than 250. He also denies any low blood sugars less than 80.  In regards to hypertension, he notes his blood pressure has been elevated over the last several days. He reports full compliance with his medications. He denies any chest pain, palpitations. As above, he has had severe headache.  Outpatient Encounter Prescriptions as of 01/14/2012  Medication Sig Dispense Refill  . amLODipine-valsartan (EXFORGE) 10-320 MG per tablet Take 1 tablet by mouth daily.        Marland Kitchen aspirin 81 MG tablet Take 81 mg by mouth daily.        Marland Kitchen atorvastatin (LIPITOR) 10 MG tablet Take 10 mg by mouth daily.        . carvedilol (COREG) 25 MG tablet Take 25 mg by mouth 2 (two) times daily.        . clopidogrel (PLAVIX) 75 MG tablet Take 75 mg by mouth daily.        . DULoxetine (CYMBALTA) 60 MG capsule Take 1 capsule (60 mg total) by mouth daily.  90 capsule  4    . Eszopiclone (ESZOPICLONE) 3 MG TABS Take 1 tablet (3 mg total) by mouth at bedtime. Take immediately before bedtime  30 tablet  0  . furosemide (LASIX) 20 MG tablet Take 20 mg by mouth daily.        Marland Kitchen glucose blood (ACCU-CHEK SMARTVIEW) test strip 1 each by Other route 3 (three) times daily as needed for other. 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 hydrALAZINE (APRESOLINE) 100 MG tablet Take 1 tablet (100 mg total) by mouth 3 (three) times daily.  90 tablet  11  . HYDROcodone-acetaminophen (VICODIN) 5-500 MG per tablet Take 2 tablets by mouth every 8 (eight) hours as needed for pain.  90 tablet  2  . insulin aspart (NOVOLOG FLEXPEN) 100 UNIT/ML injection Inject into the skin. Sliding scale      . insulin glargine (LANTUS) 100 UNIT/ML injection Inject 80 Units into the skin 2 (two) times daily.      . isosorbide mononitrate (IMDUR) 30 MG 24 hr tablet Take 30 mg by mouth daily.        Marland Kitchen lisinopril-hydrochlorothiazide (PRINZIDE,ZESTORETIC) 20-12.5 MG per tablet Take 1 tablet by mouth 2 (two) times daily.  180 tablet  4  . metFORMIN (GLUMETZA) 1000 MG (MOD) 24 hr tablet Take 1 tablet (1,000 mg total)  by mouth 2 (two) times daily.  180 tablet  4  . NITROGLYCERIN PO Take by mouth. Sublingual....specific dose is unknown       . pantoprazole (PROTONIX) 40 MG tablet Take 40 mg by mouth daily.          Review of Systems  Constitutional: Negative for fever, chills, activity change, appetite change, fatigue and unexpected weight change.  HENT: Negative for neck pain.   Eyes: Negative for visual disturbance.  Respiratory: Negative for cough and shortness of breath.   Cardiovascular: Negative for chest pain, palpitations and leg swelling.  Gastrointestinal: Negative for abdominal pain and abdominal distention.  Genitourinary: Negative for dysuria, urgency and difficulty urinating.  Musculoskeletal: Positive for gait problem. Negative for arthralgias.  Skin: Negative for color change and rash.   Neurological: Positive for headaches. Negative for dizziness, seizures, facial asymmetry and weakness.  Hematological: Negative for adenopathy.  Psychiatric/Behavioral: Negative for sleep disturbance and dysphoric mood. The patient is not nervous/anxious.    BP 182/100  Pulse 83  Temp(Src) 97.9 F (36.6 C) (Oral)  Ht 6' (1.829 m)  Wt 250 lb (113.399 kg)  BMI 33.91 kg/m2  SpO2 95%     Objective:   Physical Exam  Constitutional: He is oriented to person, place, and time. He appears well-developed and well-nourished. No distress.  HENT:  Head: Normocephalic and atraumatic.  Right Ear: External ear normal.  Left Ear: External ear normal.  Nose: Nose normal.  Mouth/Throat: Oropharynx is clear and moist. No oropharyngeal exudate.  Eyes: Conjunctivae and EOM are normal. Pupils are equal, round, and reactive to light. Right eye exhibits no discharge. Left eye exhibits no discharge. No scleral icterus.  Neck: Normal range of motion. Neck supple. No tracheal deviation present. No thyromegaly present.  Cardiovascular: Normal rate, regular rhythm and normal heart sounds.  Exam reveals no gallop and no friction rub.   No murmur heard. Pulmonary/Chest: Effort normal and breath sounds normal. No respiratory distress. He has no wheezes. He has no rales. He exhibits no tenderness.  Musculoskeletal: Normal range of motion. He exhibits no edema.  Lymphadenopathy:    He has no cervical adenopathy.  Neurological: He is alert and oriented to person, place, and time. No cranial nerve deficit. Coordination normal.  Skin: Skin is warm and dry. No rash noted. He is not diaphoretic. No erythema. No pallor.  Psychiatric: He has a normal mood and affect. His behavior is normal. Judgment and thought content normal.          Assessment & Plan:

## 2012-01-15 NOTE — Telephone Encounter (Signed)
Patient informed. 

## 2012-01-15 NOTE — Assessment & Plan Note (Signed)
Patient reports blood sugars continued to be fairly well-controlled. He did not bring a record of his blood sugars today. Will check A1c with labs next month. Continue Lantus insulin and NovoLog.

## 2012-01-15 NOTE — Assessment & Plan Note (Signed)
Headaches have recently significantly worsened after patient fell and hit his head approximately one week ago. With that fall and head injury he had loss of consciousness and subsequent nausea. Question if he had a subdural hematoma will get CT head without contrast.

## 2012-01-15 NOTE — Assessment & Plan Note (Signed)
Blood pressure markedly elevated today. Suspect this is related to ongoing issues with migraine headaches and pain. We'll have him increase his hydralazine to 100 mg 3 times daily. He will return to clinic tomorrow for blood pressure recheck. He will followup in one month.

## 2012-01-15 NOTE — Assessment & Plan Note (Signed)
Patient fell from a standing position and hit a metal frame approximately one week ago. He reports he lost consciousness and was unconscious for unknown period of time. He was alone. He then woke up and had nausea for several days. He never sought care for this. He now has severe headaches and some problems with his balance. Concerning for subdural hematoma. Will get stat head CT without contrast.

## 2012-01-16 ENCOUNTER — Ambulatory Visit (INDEPENDENT_AMBULATORY_CARE_PROVIDER_SITE_OTHER): Payer: Medicare Other | Admitting: Internal Medicine

## 2012-01-16 VITALS — BP 160/78

## 2012-01-16 DIAGNOSIS — R110 Nausea: Secondary | ICD-10-CM | POA: Diagnosis not present

## 2012-01-16 DIAGNOSIS — G43909 Migraine, unspecified, not intractable, without status migrainosus: Secondary | ICD-10-CM | POA: Insufficient documentation

## 2012-01-16 DIAGNOSIS — R51 Headache: Secondary | ICD-10-CM | POA: Diagnosis not present

## 2012-01-16 DIAGNOSIS — I10 Essential (primary) hypertension: Secondary | ICD-10-CM

## 2012-01-16 MED ORDER — AMLODIPINE BESYLATE 10 MG PO TABS: 10.0000 mg | ORAL_TABLET | Freq: Every day | ORAL | Status: DC

## 2012-01-16 MED ORDER — PROMETHAZINE HCL 25 MG/ML IJ SOLN
25.0000 mg | Freq: Once | INTRAMUSCULAR | Status: AC
  Administered 2012-01-16: 25 mg via INTRAMUSCULAR

## 2012-01-16 NOTE — Progress Notes (Signed)
MD spoke with patient, He will return Friday for recheck.

## 2012-01-16 NOTE — Assessment & Plan Note (Signed)
Headaches seem typical of migraines. Patient was given both Phenergan and a dose of Zomig in clinic today. He reports that headache improved. He will continue to use Phenergan at home and Zomig as needed. He will followup for nurse visit Friday and for clinic visit next week. He will call if symptoms are not continuing to improve.

## 2012-01-16 NOTE — Patient Instructions (Signed)
Monitor blood pressure and blood sugar daily.  Start amlodipine 10 mg one daily  Ok to take one Zomig 5 mg tablet for migraine tomorrow if needed   Return Friday for nurse visit to recheck blood pressure. Please bring your home blood pressure monitor with you.

## 2012-01-16 NOTE — Assessment & Plan Note (Signed)
Patient was not taking Exforge, which was likely the cause of elevated blood pressure. Will restart amlodipine 10 mg daily. He will followup Friday for nurse recheck of blood pressure.

## 2012-01-16 NOTE — Progress Notes (Signed)
Subjective:    Patient ID: Steve Phillips, male    DOB: June 14, 1946, 66 y.o.   MRN: [id number]  HPI 66 year old male with history of hypertension, diabetes, and persistent headaches initially presented for a nurse blood pressure check but then was complaining of nausea and vomiting so was seen in visit today. He reports his headaches have been severe. This morning he had nausea and vomited once. He took hydrocodone for his headache with minimal improvement. He denies any abdominal pain, fever, chills, diarrhea.  In regards to his blood pressure medications, he notes that he has not been taking his Exforge. He has also not been taking Lasix. Blood pressure has been elevated at home, typically near 180/100. He denies chest pain. He does have severe headache which is described as diffuse and throbbing. He does not have palpitations.  Outpatient Encounter Prescriptions as of 01/16/2012  Medication Sig Dispense Refill  . amLODipine (NORVASC) 10 MG tablet Take 1 tablet (10 mg total) by mouth daily.  90 tablet  1  . aspirin 81 MG tablet Take 81 mg by mouth daily.        . carvedilol (COREG) 25 MG tablet Take 25 mg by mouth 2 (two) times daily.        . clopidogrel (PLAVIX) 75 MG tablet Take 75 mg by mouth daily.        . DULoxetine (CYMBALTA) 60 MG capsule Take 1 capsule (60 mg total) by mouth daily.  90 capsule  4  . Eszopiclone (ESZOPICLONE) 3 MG TABS Take 1 tablet (3 mg total) by mouth at bedtime. Take immediately before bedtime  30 tablet  0  . glucose blood (ACCU-CHEK SMARTVIEW) test strip 1 each by Other route 3 (three) times daily as needed for other. 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 hydrALAZINE (APRESOLINE) 100 MG tablet Take 50-100 mg by mouth 3 (three) times daily. As needed for Blood pressure 160 over 100 or greater      . HYDROcodone-acetaminophen (VICODIN) 5-500 MG per tablet Take 2 tablets by mouth every 8 (eight) hours as needed for pain.  90 tablet  2  . insulin aspart (NOVOLOG  FLEXPEN) 100 UNIT/ML injection Inject into the skin. Sliding scale      . insulin glargine (LANTUS) 100 UNIT/ML injection Inject 80 Units into the skin 2 (two) times daily.      . isosorbide mononitrate (IMDUR) 30 MG 24 hr tablet Take 30 mg by mouth daily.        Marland Kitchen lisinopril-hydrochlorothiazide (PRINZIDE,ZESTORETIC) 20-12.5 MG per tablet Take 1 tablet by mouth 2 (two) times daily.  180 tablet  4  . metFORMIN (GLUMETZA) 1000 MG (MOD) 24 hr tablet Take 1 tablet (1,000 mg total) by mouth 2 (two) times daily.  180 tablet  4  . NITROGLYCERIN PO Take by mouth. Sublingual....specific dose is unknown       . pantoprazole (PROTONIX) 40 MG tablet Take 40 mg by mouth daily.        . promethazine (PHENERGAN) 25 MG tablet Take 25 mg by mouth every 6 (six) hours as needed.      Marland Kitchen DISCONTD: hydrALAZINE (APRESOLINE) 100 MG tablet Take 1 tablet (100 mg total) by mouth 3 (three) times daily.  90 tablet  11  . DISCONTD: amLODipine-valsartan (EXFORGE) 10-320 MG per tablet Take 1 tablet by mouth daily.        Marland Kitchen DISCONTD: atorvastatin (LIPITOR) 10 MG tablet Take 10 mg by mouth daily.        Marland Kitchen  DISCONTD: furosemide (LASIX) 20 MG tablet Take 20 mg by mouth daily.         Facility-Administered Encounter Medications as of 01/16/2012  Medication Dose Route Frequency Provider Last Rate Last Dose  . promethazine (PHENERGAN) injection 25 mg  25 mg Intramuscular Once Shelia Media, MD   25 mg at 01/16/12 1208    Review of Systems  Constitutional: Negative for fever, chills, activity change, appetite change, fatigue and unexpected weight change.  Eyes: Negative for visual disturbance.  Respiratory: Negative for cough and shortness of breath.   Cardiovascular: Negative for chest pain, palpitations and leg swelling.  Gastrointestinal: Positive for nausea and vomiting. Negative for abdominal pain and abdominal distention.  Genitourinary: Negative for dysuria, urgency and difficulty urinating.  Musculoskeletal: Negative for  arthralgias and gait problem.  Skin: Negative for color change and rash.  Neurological: Positive for headaches.  Hematological: Negative for adenopathy.  Psychiatric/Behavioral: Negative for sleep disturbance and dysphoric mood. The patient is not nervous/anxious.    BP 160/78     Objective:   Physical Exam  Constitutional: He is oriented to person, place, and time. He appears well-developed and well-nourished. No distress.  HENT:  Head: Normocephalic and atraumatic.  Right Ear: External ear normal.  Left Ear: External ear normal.  Nose: Nose normal.  Mouth/Throat: Oropharynx is clear and moist.  Eyes: Conjunctivae and EOM are normal. Pupils are equal, round, and reactive to light. Right eye exhibits no discharge. Left eye exhibits no discharge. No scleral icterus.  Neck: Normal range of motion. Neck supple. No tracheal deviation present. No thyromegaly present.  Cardiovascular: Normal rate, regular rhythm and normal heart sounds.  Exam reveals no gallop and no friction rub.   No murmur heard. Pulmonary/Chest: Effort normal and breath sounds normal. No respiratory distress. He has no wheezes. He has no rales. He exhibits no tenderness.  Musculoskeletal: Normal range of motion. He exhibits no edema.  Lymphadenopathy:    He has no cervical adenopathy.  Neurological: He is alert and oriented to person, place, and time. No cranial nerve deficit. Coordination normal.  Skin: Skin is warm and dry. No rash noted. He is not diaphoretic. No erythema. No pallor.  Psychiatric: He has a normal mood and affect. His behavior is normal. Judgment and thought content normal.          Assessment & Plan:

## 2012-01-16 NOTE — Assessment & Plan Note (Addendum)
Likely associated with migraine headache. Patient was given dose of Phenergan in clinic with some improvement. He will be given a prescription of Phenergan to use at home. He will followup here on Friday for nurse recheck and then for visit next week.

## 2012-01-18 ENCOUNTER — Encounter: Payer: Self-pay | Admitting: Internal Medicine

## 2012-01-18 ENCOUNTER — Ambulatory Visit (INDEPENDENT_AMBULATORY_CARE_PROVIDER_SITE_OTHER): Payer: Medicare Other | Admitting: Internal Medicine

## 2012-01-18 DIAGNOSIS — G43909 Migraine, unspecified, not intractable, without status migrainosus: Secondary | ICD-10-CM | POA: Diagnosis not present

## 2012-01-18 DIAGNOSIS — R51 Headache: Secondary | ICD-10-CM

## 2012-01-18 DIAGNOSIS — I10 Essential (primary) hypertension: Secondary | ICD-10-CM

## 2012-01-18 MED ORDER — PROMETHAZINE HCL 25 MG/ML IJ SOLN: 25.0000 mg | Freq: Once | INTRAMUSCULAR | Status: DC

## 2012-01-18 NOTE — Progress Notes (Signed)
Subjective:    Patient ID: Steve Phillips, male    DOB: 06-Mar-1946, 66 y.o.   MRN: [id number]  HPI 66 year old male with history of diabetes, hypertension, CAD, initially presented today for nurse visit for blood pressure check. His blood pressure was noted to be well-controlled. However, he complained of severe headache. He reports the headache started last night. It started in the back of his neck and extends over his entire head. He took a dose of Zometa last night with some improvement. However, this morning the headache is worse. He reports mild nausea. He notes that he took the dose of hydralazine last night to help control his blood pressure and headache seemed to get much worse with that medication. He denies any visual changes. He denies any focal neurologic symptoms. Notably, he had CT of the head without contrast performed earlier this week which was normal.  Outpatient Encounter Prescriptions as of 01/18/2012  Medication Sig Dispense Refill  . amLODipine (NORVASC) 10 MG tablet Take 1 tablet (10 mg total) by mouth daily.  90 tablet  1  . aspirin 81 MG tablet Take 81 mg by mouth daily.        . carvedilol (COREG) 25 MG tablet Take 25 mg by mouth 2 (two) times daily.        . clopidogrel (PLAVIX) 75 MG tablet Take 75 mg by mouth daily.        . DULoxetine (CYMBALTA) 60 MG capsule Take 1 capsule (60 mg total) by mouth daily.  90 capsule  4  . Eszopiclone (ESZOPICLONE) 3 MG TABS Take 1 tablet (3 mg total) by mouth at bedtime. Take immediately before bedtime  30 tablet  0  . glucose blood (ACCU-CHEK SMARTVIEW) test strip 1 each by Other route 3 (three) times daily as needed for other. 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 hydrALAZINE (APRESOLINE) 100 MG tablet Take 50-100 mg by mouth 3 (three) times daily. As needed for Blood pressure 160 over 100 or greater      . HYDROcodone-acetaminophen (VICODIN) 5-500 MG per tablet Take 2 tablets by mouth every 8 (eight) hours as needed for pain.  90  tablet  2  . insulin aspart (NOVOLOG FLEXPEN) 100 UNIT/ML injection Inject into the skin. Sliding scale      . insulin glargine (LANTUS) 100 UNIT/ML injection Inject 80 Units into the skin 2 (two) times daily.      . isosorbide mononitrate (IMDUR) 30 MG 24 hr tablet Take 30 mg by mouth daily.        Marland Kitchen lisinopril-hydrochlorothiazide (PRINZIDE,ZESTORETIC) 20-12.5 MG per tablet Take 1 tablet by mouth 2 (two) times daily.  180 tablet  4  . metFORMIN (GLUMETZA) 1000 MG (MOD) 24 hr tablet Take 1 tablet (1,000 mg total) by mouth 2 (two) times daily.  180 tablet  4  . NITROGLYCERIN PO Take by mouth. Sublingual....specific dose is unknown       . pantoprazole (PROTONIX) 40 MG tablet Take 40 mg by mouth daily.        . promethazine (PHENERGAN) 25 MG tablet Take 25 mg by mouth every 6 (six) hours as needed.       Facility-Administered Encounter Medications as of 01/18/2012  Medication Dose Route Frequency Provider Last Rate Last Dose  . promethazine (PHENERGAN) injection 25 mg  25 mg Intramuscular Once Shelia Media, MD        Review of Systems  Constitutional: Negative for fever and chills.  Respiratory:  Negative for shortness of breath.   Cardiovascular: Negative for chest pain and leg swelling.  Neurological: Positive for headaches. Negative for dizziness, tremors, seizures, syncope, speech difficulty, weakness and light-headedness.  Psychiatric/Behavioral: Positive for dysphoric mood and decreased concentration. The patient is nervous/anxious.    BP 142/82     Objective:   Physical Exam  Constitutional: He appears well-developed and well-nourished. He appears distressed.  HENT:  Head: Normocephalic and atraumatic.  Eyes: Conjunctivae and EOM are normal. Pupils are equal, round, and reactive to light.  Neck: Normal range of motion. Neck supple.  Pulmonary/Chest: Breath sounds normal.  Skin: He is not diaphoretic.          Assessment & Plan:

## 2012-01-18 NOTE — Assessment & Plan Note (Signed)
Blood pressure control improved with resuming amlodipine. We'll plan to continue. Will try to avoid use of hydralazine given that this seemed to exacerbate his headache.

## 2012-01-18 NOTE — Assessment & Plan Note (Addendum)
Headaches seem typical of migraines. Patient's headache earlier this week improved with the use of Phenergan, so will repeat dose today. Suspect his headache last night was worsened with the use of hydralazine. Now that he is back on blood pressure medications, will try to avoid use of hydralazine if possible. We discussed potential options to help prevent recurrent headaches. Question if he might benefit from a course of prednisone. However, he has elevated blood pressure and blood sugar which makes use of this medication difficult. He is not taking Pamelor because he did not find improvement with this medication. Question if medication such as Topamax might be helpful. Given that the pain in his head recently has started in his neck and radiated forward, also question whether CTA might be helpful in evaluation. Recent non-contrast CT head and MRI were normal. We'll try to set him back up to see neurology early next week. If the headache today persist after the use of Phenergan, will consider admission for better pain control.

## 2012-01-21 ENCOUNTER — Telehealth: Payer: Self-pay | Admitting: *Deleted

## 2012-01-21 MED ORDER — TIZANIDINE HCL 4 MG PO TABS: 4.0000 mg | ORAL_TABLET | Freq: Three times a day (TID) | ORAL | Status: DC

## 2012-01-21 NOTE — Telephone Encounter (Signed)
Spoke with Wife, patient has not improved he is sleeping now w/another h/a. Rx sent in and they will call back w/any problems. I stressed that pt be cautious w/walking, driving etc when starting this new med.

## 2012-01-21 NOTE — Telephone Encounter (Signed)
Message copied by Vernie Murders on Mon Jan 21, 2012  2:26 PM ------      Message from: Ronna Polio A      Created: Mon Jan 21, 2012 12:10 PM      Regarding: FW: Headaches       Maralyn Sago,       Drucilla Schmidt you mind asking Mr. Tejera if his headaches are better? If not, we can try the Zanaflex as Dr. Modesto Charon suggested.      Candise Bowens      ----- Message -----         From: Denton Meek, MD         Sent: 01/21/2012  11:51 AM           To: Shelia Media, MD, Argie Ramming      Subject: RE: Headaches                                            Hi Jen,            No indication for CTA unless you though he had a dissection, but this is very unlikely given his age.  If you think his neck is contributing, giving him a short period of zanaflex 4mg  tid can be useful as it sometimes can abort prolonged headaches. You could give him two weeks worth.            Misty Stanley - Could you see if we can fit Mr. Cartmell in over the next couple of weeks?            Matt                  ----- Message -----         From: Shelia Media, MD         Sent: 01/18/2012  12:03 PM           To: Denton Meek, MD      Subject: Headaches                                                Mr. Strout has had recurrent episodes of severe headaches over the last few weeks.  He had another episode of falling and striking his head about 2 weeks ago. He had LOC with this episode.  I repeated non-contrast head CT which was normal.  Over the last few days, headaches severe with nausea and vomiting.  He stopped pamelor because felt not working.  I tried Zomig with some improvement, however headaches are daily, so not a great option.  Thought about prednisone taper, but has extremely high BP and blood sugar.        Can we get him into see you for follow up?      And, given that he has severe headache with some posterior neck pain, would CTA be helpful?      Thanks so much!      Candise Bowens

## 2012-01-25 ENCOUNTER — Encounter: Payer: Self-pay | Admitting: Internal Medicine

## 2012-01-25 ENCOUNTER — Ambulatory Visit (INDEPENDENT_AMBULATORY_CARE_PROVIDER_SITE_OTHER): Payer: Medicare Other | Admitting: Internal Medicine

## 2012-01-25 VITALS — BP 150/62 | HR 77 | Temp 98.1°F | Wt 246.0 lb

## 2012-01-25 DIAGNOSIS — I10 Essential (primary) hypertension: Secondary | ICD-10-CM | POA: Diagnosis not present

## 2012-01-25 DIAGNOSIS — R51 Headache: Secondary | ICD-10-CM | POA: Diagnosis not present

## 2012-01-25 MED ORDER — TIZANIDINE HCL 4 MG PO TABS: 4.0000 mg | ORAL_TABLET | Freq: Three times a day (TID) | ORAL | Status: AC

## 2012-01-25 NOTE — Assessment & Plan Note (Signed)
Blood pressure is improved today. Suspect that recent elevated blood pressures were secondary to pain from headache. We'll continue to monitor closely. He will bring a record of his blood pressures at home to his next visit.

## 2012-01-25 NOTE — Assessment & Plan Note (Signed)
Symptoms improved with use of Zanaflex. We'll plan to continue. He will try tapering to once daily. He will followup in 2 weeks here.

## 2012-01-25 NOTE — Progress Notes (Signed)
Subjective:    Patient ID: Steve Phillips, male    DOB: Jun 11, 1946, 66 y.o.   MRN: [id number]  HPI 66 year old male with diabetes, hypertension, and chronic headaches presents for followup. He was recently started on Zanaflex to help with headaches. He reports significant improvement in his symptoms. He has had some drowsiness Zanaflex so has only been using twice per day. He reports that headaches are almost completely resolved. Next  In regards to his hypertension, he has not been checking his blood pressure. He denies chest pain or palpitations. He reports full compliance with his medication.  Outpatient Encounter Prescriptions as of 01/25/2012  Medication Sig Dispense Refill  . amLODipine (NORVASC) 10 MG tablet Take 1 tablet (10 mg total) by mouth daily.  90 tablet  1  . aspirin 81 MG tablet Take 81 mg by mouth daily.        . carvedilol (COREG) 25 MG tablet Take 25 mg by mouth 2 (two) times daily.        . clopidogrel (PLAVIX) 75 MG tablet Take 75 mg by mouth daily.        . DULoxetine (CYMBALTA) 60 MG capsule Take 1 capsule (60 mg total) by mouth daily.  90 capsule  4  . Eszopiclone (ESZOPICLONE) 3 MG TABS Take 1 tablet (3 mg total) by mouth at bedtime. Take immediately before bedtime  30 tablet  0  . glucose blood (ACCU-CHEK SMARTVIEW) test strip 1 each by Other route 3 (three) times daily as needed for other. 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 hydrALAZINE (APRESOLINE) 100 MG tablet Take 50-100 mg by mouth 3 (three) times daily. As needed for Blood pressure 160 over 100 or greater      . HYDROcodone-acetaminophen (VICODIN) 5-500 MG per tablet Take 2 tablets by mouth every 8 (eight) hours as needed for pain.  90 tablet  2  . insulin aspart (NOVOLOG FLEXPEN) 100 UNIT/ML injection Inject into the skin. Sliding scale      . insulin glargine (LANTUS) 100 UNIT/ML injection Inject 80 Units into the skin 2 (two) times daily.      . isosorbide mononitrate (IMDUR) 30 MG 24 hr tablet Take 30  mg by mouth daily.        Marland Kitchen lisinopril-hydrochlorothiazide (PRINZIDE,ZESTORETIC) 20-12.5 MG per tablet Take 1 tablet by mouth 2 (two) times daily.  180 tablet  4  . metFORMIN (GLUMETZA) 1000 MG (MOD) 24 hr tablet Take 1 tablet (1,000 mg total) by mouth 2 (two) times daily.  180 tablet  4  . NITROGLYCERIN PO Take by mouth. Sublingual....specific dose is unknown       . pantoprazole (PROTONIX) 40 MG tablet Take 40 mg by mouth daily.        . promethazine (PHENERGAN) 25 MG tablet Take 25 mg by mouth every 6 (six) hours as needed.      Marland Kitchen tiZANidine (ZANAFLEX) 4 MG tablet Take 1 tablet (4 mg total) by mouth 3 (three) times daily.  42 tablet  3  . DISCONTD: tiZANidine (ZANAFLEX) 4 MG tablet Take 1 tablet (4 mg total) by mouth 3 (three) times daily.  42 tablet  0     Review of Systems  Constitutional: Negative for fever, chills, activity change, appetite change, fatigue and unexpected weight change.  Eyes: Negative for visual disturbance.  Respiratory: Negative for cough and shortness of breath.   Cardiovascular: Negative for chest pain, palpitations and leg swelling.  Gastrointestinal: Negative for abdominal pain and  abdominal distention.  Genitourinary: Negative for dysuria, urgency and difficulty urinating.  Musculoskeletal: Negative for arthralgias and gait problem.  Skin: Negative for color change and rash.  Neurological: Positive for headaches.  Hematological: Negative for adenopathy.  Psychiatric/Behavioral: Negative for sleep disturbance and dysphoric mood. The patient is not nervous/anxious.    BP 150/62  Pulse 77  Temp(Src) 98.1 F (36.7 C) (Oral)  Wt 246 lb (111.585 kg)  SpO2 96%     Objective:   Physical Exam  Constitutional: He is oriented to person, place, and time. He appears well-developed and well-nourished. No distress.  HENT:  Head: Normocephalic and atraumatic.  Right Ear: External ear normal.  Left Ear: External ear normal.  Nose: Nose normal.  Mouth/Throat:  Oropharynx is clear and moist. No oropharyngeal exudate.  Eyes: Conjunctivae and EOM are normal. Pupils are equal, round, and reactive to light. Right eye exhibits no discharge. Left eye exhibits no discharge. No scleral icterus.  Neck: Normal range of motion. Neck supple. No tracheal deviation present. No thyromegaly present.  Cardiovascular: Normal rate, regular rhythm and normal heart sounds.  Exam reveals no gallop and no friction rub.   No murmur heard. Pulmonary/Chest: Effort normal and breath sounds normal. No respiratory distress. He has no wheezes. He has no rales. He exhibits no tenderness.  Musculoskeletal: Normal range of motion. He exhibits no edema.  Lymphadenopathy:    He has no cervical adenopathy.  Neurological: He is alert and oriented to person, place, and time. No cranial nerve deficit. Coordination normal.  Skin: Skin is warm and dry. No rash noted. He is not diaphoretic. No erythema. No pallor.  Psychiatric: He has a normal mood and affect. His behavior is normal. Judgment and thought content normal.          Assessment & Plan:

## 2012-01-28 ENCOUNTER — Encounter: Payer: Self-pay | Admitting: Internal Medicine

## 2012-01-29 DIAGNOSIS — F339 Major depressive disorder, recurrent, unspecified: Secondary | ICD-10-CM | POA: Diagnosis not present

## 2012-02-07 ENCOUNTER — Ambulatory Visit (INDEPENDENT_AMBULATORY_CARE_PROVIDER_SITE_OTHER): Payer: Medicare Other | Admitting: Internal Medicine

## 2012-02-07 ENCOUNTER — Encounter: Payer: Self-pay | Admitting: Internal Medicine

## 2012-02-07 VITALS — BP 128/62 | HR 76 | Temp 97.4°F | Ht 72.0 in | Wt 239.0 lb

## 2012-02-07 DIAGNOSIS — I10 Essential (primary) hypertension: Secondary | ICD-10-CM | POA: Diagnosis not present

## 2012-02-07 DIAGNOSIS — R269 Unspecified abnormalities of gait and mobility: Secondary | ICD-10-CM | POA: Diagnosis not present

## 2012-02-07 DIAGNOSIS — E119 Type 2 diabetes mellitus without complications: Secondary | ICD-10-CM | POA: Diagnosis not present

## 2012-02-07 DIAGNOSIS — R51 Headache: Secondary | ICD-10-CM

## 2012-02-07 MED ORDER — CLOPIDOGREL BISULFATE 75 MG PO TABS: 75.0000 mg | ORAL_TABLET | Freq: Every day | ORAL | Status: DC

## 2012-02-07 NOTE — Assessment & Plan Note (Signed)
Reason improvement with Zanaflex. We'll plan to continue.

## 2012-02-07 NOTE — Assessment & Plan Note (Signed)
Blood sugars recently fairly well controlled. Will plan to repeat hemoglobin A1c in May 2013. We'll plan to continue metformin and Lantus.

## 2012-02-07 NOTE — Assessment & Plan Note (Signed)
Blood pressure is well-controlled. Will plan to continue amlodipine, carvedilol, hydralazine, Imdur, lisinopril hydrochlorothiazide. Followup in 2 months.

## 2012-02-07 NOTE — Assessment & Plan Note (Signed)
Unstable balance and shuffling gait with frequent falls.  Will set up follow up with neurology. Question if EMG testing and vestibular PT might be helpful.

## 2012-02-07 NOTE — Progress Notes (Signed)
Subjective:    Patient ID: Steve Phillips, male    DOB: 1946/04/24, 66 y.o.   MRN: [id number]  HPI 66 year old male with history of diabetes, hypertension, chronic headaches presents for followup. In regards to his diabetes, he reports that his sugars have been well controlled with fasting sugars near 100-150 and postprandial sugars less than 200. His wife did note one elevated sugar greater than 401 low sugar at 60 which he was symptomatic with. He reports full compliance with his medications.  In regards to his chronic headaches, he reports continue to control of his symptoms with use of Zanaflex. He occasionally has headache which lasts for a few minutes but resolves.  His wife is concerned today about recurrent falls. She notes that he has difficulty with balance and has fallen on several occasions. He notes that he has difficulty walking on uneven surfaces. He also notices shuffling gait. He has some numbness in his lower legs.  Outpatient Encounter Prescriptions as of 02/07/2012  Medication Sig Dispense Refill  . amLODipine (NORVASC) 10 MG tablet Take 1 tablet (10 mg total) by mouth daily.  90 tablet  1  . aspirin 81 MG tablet Take 81 mg by mouth daily.        . carvedilol (COREG) 25 MG tablet Take 25 mg by mouth 2 (two) times daily.        . clopidogrel (PLAVIX) 75 MG tablet Take 1 tablet (75 mg total) by mouth daily.  90 tablet  2  . DULoxetine (CYMBALTA) 60 MG capsule Take 1 capsule (60 mg total) by mouth daily.  90 capsule  4  . Eszopiclone (ESZOPICLONE) 3 MG TABS Take 1 tablet (3 mg total) by mouth at bedtime. Take immediately before bedtime  30 tablet  0  . glucose blood (ACCU-CHEK SMARTVIEW) test strip 1 each by Other route 3 (three) times daily as needed for other. 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 hydrALAZINE (APRESOLINE) 100 MG tablet Take 50-100 mg by mouth 3 (three) times daily. As needed for Blood pressure 160 over 100 or greater      . HYDROcodone-acetaminophen  (VICODIN) 5-500 MG per tablet Take 2 tablets by mouth every 8 (eight) hours as needed for pain.  90 tablet  2  . insulin aspart (NOVOLOG FLEXPEN) 100 UNIT/ML injection Inject into the skin. Sliding scale      . insulin glargine (LANTUS) 100 UNIT/ML injection Inject 80 Units into the skin 2 (two) times daily.      . isosorbide mononitrate (IMDUR) 30 MG 24 hr tablet Take 30 mg by mouth daily.        Marland Kitchen lisinopril-hydrochlorothiazide (PRINZIDE,ZESTORETIC) 20-12.5 MG per tablet Take 1 tablet by mouth 2 (two) times daily.  180 tablet  4  . metFORMIN (GLUMETZA) 1000 MG (MOD) 24 hr tablet Take 1 tablet (1,000 mg total) by mouth 2 (two) times daily.  180 tablet  4  . NITROGLYCERIN PO Take by mouth. Sublingual....specific dose is unknown       . pantoprazole (PROTONIX) 40 MG tablet Take 40 mg by mouth daily.        . promethazine (PHENERGAN) 25 MG tablet Take 25 mg by mouth every 6 (six) hours as needed.      Marland Kitchen DISCONTD: clopidogrel (PLAVIX) 75 MG tablet Take 75 mg by mouth daily.           Review of Systems  Constitutional: Negative for fever, chills, activity change, appetite change, fatigue and unexpected  weight change.  Eyes: Negative for visual disturbance.  Respiratory: Negative for cough and shortness of breath.   Cardiovascular: Negative for chest pain, palpitations and leg swelling.  Gastrointestinal: Negative for abdominal pain and abdominal distention.  Genitourinary: Negative for dysuria, urgency and difficulty urinating.  Musculoskeletal: Positive for gait problem. Negative for arthralgias.  Skin: Negative for color change and rash.  Neurological: Positive for weakness, numbness and headaches. Negative for tremors, seizures and syncope.  Hematological: Negative for adenopathy.  Psychiatric/Behavioral: Negative for sleep disturbance and dysphoric mood. The patient is not nervous/anxious.    BP 128/62  Pulse 76  Temp(Src) 97.4 F (36.3 C) (Oral)  Ht 6' (1.829 m)  Wt 239 lb (108.41 kg)   BMI 32.41 kg/m2  SpO2 97%     Objective:   Physical Exam  Constitutional: He is oriented to person, place, and time. He appears well-developed and well-nourished. No distress.  HENT:  Head: Normocephalic and atraumatic.  Right Ear: External ear normal.  Left Ear: External ear normal.  Nose: Nose normal.  Mouth/Throat: Oropharynx is clear and moist. No oropharyngeal exudate.  Eyes: Conjunctivae and EOM are normal. Pupils are equal, round, and reactive to light. Right eye exhibits no discharge. Left eye exhibits no discharge. No scleral icterus.  Neck: Normal range of motion. Neck supple. No tracheal deviation present. No thyromegaly present.  Cardiovascular: Normal rate, regular rhythm and normal heart sounds.  Exam reveals no gallop and no friction rub.   No murmur heard. Pulmonary/Chest: Effort normal and breath sounds normal. No respiratory distress. He has no wheezes. He has no rales. He exhibits no tenderness.  Musculoskeletal: Normal range of motion. He exhibits no edema.  Lymphadenopathy:    He has no cervical adenopathy.  Neurological: He is alert and oriented to person, place, and time. No cranial nerve deficit. Coordination abnormal.  Skin: Skin is warm and dry. No rash noted. He is not diaphoretic. No erythema. No pallor.  Psychiatric: He has a normal mood and affect. His behavior is normal. Judgment and thought content normal.          Assessment & Plan:

## 2012-02-12 ENCOUNTER — Ambulatory Visit: Payer: Medicare Other | Admitting: Internal Medicine

## 2012-02-18 DIAGNOSIS — E1142 Type 2 diabetes mellitus with diabetic polyneuropathy: Secondary | ICD-10-CM | POA: Diagnosis not present

## 2012-02-18 DIAGNOSIS — E1149 Type 2 diabetes mellitus with other diabetic neurological complication: Secondary | ICD-10-CM | POA: Diagnosis not present

## 2012-02-18 DIAGNOSIS — L97409 Non-pressure chronic ulcer of unspecified heel and midfoot with unspecified severity: Secondary | ICD-10-CM | POA: Diagnosis not present

## 2012-02-18 HISTORY — PX: CARDIAC CATHETERIZATION: SHX172

## 2012-02-25 ENCOUNTER — Inpatient Hospital Stay: Payer: Self-pay | Admitting: Internal Medicine

## 2012-02-25 ENCOUNTER — Telehealth: Payer: Self-pay | Admitting: Internal Medicine

## 2012-02-25 DIAGNOSIS — R0789 Other chest pain: Secondary | ICD-10-CM | POA: Diagnosis not present

## 2012-02-25 DIAGNOSIS — Z885 Allergy status to narcotic agent status: Secondary | ICD-10-CM | POA: Diagnosis not present

## 2012-02-25 DIAGNOSIS — E119 Type 2 diabetes mellitus without complications: Secondary | ICD-10-CM | POA: Diagnosis not present

## 2012-02-25 DIAGNOSIS — H6120 Impacted cerumen, unspecified ear: Secondary | ICD-10-CM | POA: Diagnosis present

## 2012-02-25 DIAGNOSIS — E785 Hyperlipidemia, unspecified: Secondary | ICD-10-CM | POA: Diagnosis present

## 2012-02-25 DIAGNOSIS — Z808 Family history of malignant neoplasm of other organs or systems: Secondary | ICD-10-CM | POA: Diagnosis not present

## 2012-02-25 DIAGNOSIS — E1142 Type 2 diabetes mellitus with diabetic polyneuropathy: Secondary | ICD-10-CM | POA: Diagnosis present

## 2012-02-25 DIAGNOSIS — Z794 Long term (current) use of insulin: Secondary | ICD-10-CM | POA: Diagnosis not present

## 2012-02-25 DIAGNOSIS — Z6833 Body mass index (BMI) 33.0-33.9, adult: Secondary | ICD-10-CM | POA: Diagnosis not present

## 2012-02-25 DIAGNOSIS — F329 Major depressive disorder, single episode, unspecified: Secondary | ICD-10-CM | POA: Diagnosis present

## 2012-02-25 DIAGNOSIS — R51 Headache: Secondary | ICD-10-CM | POA: Diagnosis present

## 2012-02-25 DIAGNOSIS — Z79899 Other long term (current) drug therapy: Secondary | ICD-10-CM | POA: Diagnosis not present

## 2012-02-25 DIAGNOSIS — I200 Unstable angina: Secondary | ICD-10-CM | POA: Diagnosis present

## 2012-02-25 DIAGNOSIS — Z9889 Other specified postprocedural states: Secondary | ICD-10-CM | POA: Diagnosis not present

## 2012-02-25 DIAGNOSIS — L02619 Cutaneous abscess of unspecified foot: Secondary | ICD-10-CM | POA: Diagnosis present

## 2012-02-25 DIAGNOSIS — R52 Pain, unspecified: Secondary | ICD-10-CM | POA: Diagnosis not present

## 2012-02-25 DIAGNOSIS — E1149 Type 2 diabetes mellitus with other diabetic neurological complication: Secondary | ICD-10-CM | POA: Diagnosis present

## 2012-02-25 DIAGNOSIS — Z7902 Long term (current) use of antithrombotics/antiplatelets: Secondary | ICD-10-CM | POA: Diagnosis not present

## 2012-02-25 DIAGNOSIS — E669 Obesity, unspecified: Secondary | ICD-10-CM | POA: Diagnosis present

## 2012-02-25 DIAGNOSIS — Z8781 Personal history of (healed) traumatic fracture: Secondary | ICD-10-CM | POA: Diagnosis not present

## 2012-02-25 DIAGNOSIS — Z803 Family history of malignant neoplasm of breast: Secondary | ICD-10-CM | POA: Diagnosis not present

## 2012-02-25 DIAGNOSIS — Z833 Family history of diabetes mellitus: Secondary | ICD-10-CM | POA: Diagnosis not present

## 2012-02-25 DIAGNOSIS — L03119 Cellulitis of unspecified part of limb: Secondary | ICD-10-CM | POA: Diagnosis not present

## 2012-02-25 DIAGNOSIS — I2510 Atherosclerotic heart disease of native coronary artery without angina pectoris: Secondary | ICD-10-CM | POA: Diagnosis not present

## 2012-02-25 DIAGNOSIS — F339 Major depressive disorder, recurrent, unspecified: Secondary | ICD-10-CM | POA: Diagnosis not present

## 2012-02-25 DIAGNOSIS — L97509 Non-pressure chronic ulcer of other part of unspecified foot with unspecified severity: Secondary | ICD-10-CM | POA: Diagnosis present

## 2012-02-25 DIAGNOSIS — I10 Essential (primary) hypertension: Secondary | ICD-10-CM | POA: Diagnosis present

## 2012-02-25 DIAGNOSIS — R079 Chest pain, unspecified: Secondary | ICD-10-CM | POA: Diagnosis not present

## 2012-02-25 DIAGNOSIS — Z8249 Family history of ischemic heart disease and other diseases of the circulatory system: Secondary | ICD-10-CM | POA: Diagnosis not present

## 2012-02-25 DIAGNOSIS — F3289 Other specified depressive episodes: Secondary | ICD-10-CM | POA: Diagnosis not present

## 2012-02-25 DIAGNOSIS — E876 Hypokalemia: Secondary | ICD-10-CM | POA: Diagnosis not present

## 2012-02-25 DIAGNOSIS — Z7982 Long term (current) use of aspirin: Secondary | ICD-10-CM | POA: Diagnosis not present

## 2012-02-25 LAB — BASIC METABOLIC PANEL
BUN: 11 mg/dL (ref 7–18)
Calcium, Total: 8.9 mg/dL (ref 8.5–10.1)
Chloride: 98 mmol/L (ref 98–107)
Co2: 28 mmol/L (ref 21–32)
Creatinine: 0.93 mg/dL (ref 0.60–1.30)
EGFR (Non-African Amer.): >60
Glucose: 259 mg/dL — ABNORMAL HIGH (ref 65–99)
Osmolality: 282 (ref 275–301)
Potassium: 3.8 mmol/L (ref 3.5–5.1)
Sodium: 137 mmol/L (ref 136–145)

## 2012-02-25 LAB — CBC
HGB: 14.7 g/dL (ref 13.0–18.0)
MCH: 24.5 pg — ABNORMAL LOW (ref 26.0–34.0)
MCHC: 32.7 g/dL (ref 32.0–36.0)
MCV: 75 fL — ABNORMAL LOW (ref 80–100)
Platelet: 228 10*3/uL (ref 150–440)
RBC: 5.99 10*6/uL — ABNORMAL HIGH (ref 4.40–5.90)
RDW: 18.1 % — ABNORMAL HIGH (ref 11.5–14.5)

## 2012-02-25 LAB — TROPONIN I: Troponin-I: <0.02 ng/mL

## 2012-02-25 NOTE — Telephone Encounter (Signed)
Pt wife called stated patient was having chest pains shortness of breath.   i spoke with dr walker and she instructed me to tell pt to go to er,.  Also she wanted me to transfer to traige nurse.  No one answer the recorded stated all were busy at this time.  Ms Poage stated the were getting ready to go er now [facility]

## 2012-02-26 DIAGNOSIS — I2510 Atherosclerotic heart disease of native coronary artery without angina pectoris: Secondary | ICD-10-CM

## 2012-02-26 DIAGNOSIS — I200 Unstable angina: Secondary | ICD-10-CM

## 2012-02-26 LAB — LIPID PANEL
Cholesterol: 136 mg/dL (ref 0–200)
HDL Cholesterol: 24 mg/dL — ABNORMAL LOW (ref 40–60)
Ldl Cholesterol, Calc: 65 mg/dL (ref 0–100)
VLDL Cholesterol, Calc: 47 mg/dL — ABNORMAL HIGH (ref 5–40)

## 2012-02-26 LAB — CBC WITH DIFFERENTIAL/PLATELET
Basophil %: 0.4 %
Eosinophil #: 0.6 10*3/uL (ref 0.0–0.7)
HCT: 41.7 % (ref 40.0–52.0)
HGB: 13.8 g/dL (ref 13.0–18.0)
Lymphocyte %: 25.8 %
MCH: 24.6 pg — ABNORMAL LOW (ref 26.0–34.0)
MCHC: 33.1 g/dL (ref 32.0–36.0)
MCV: 74 fL — ABNORMAL LOW (ref 80–100)
Monocyte #: 0.7 10*3/uL (ref 0.0–0.7)
Neutrophil #: 5.8 10*3/uL (ref 1.4–6.5)
Neutrophil %: 60.4 %
RBC: 5.61 10*6/uL (ref 4.40–5.90)

## 2012-02-26 LAB — BASIC METABOLIC PANEL
Anion Gap: 10 (ref 7–16)
Calcium, Total: 8.7 mg/dL (ref 8.5–10.1)
Creatinine: 0.8 mg/dL (ref 0.60–1.30)
Potassium: 3.4 mmol/L — ABNORMAL LOW (ref 3.5–5.1)
Sodium: 140 mmol/L (ref 136–145)

## 2012-02-26 LAB — CK TOTAL AND CKMB (NOT AT ARMC)
CK, Total: 52 U/L (ref 35–232)
CK-MB: 1.2 ng/mL (ref 0.5–3.6)

## 2012-02-26 LAB — TROPONIN I: Troponin-I: <0.02 ng/mL

## 2012-02-27 DIAGNOSIS — F339 Major depressive disorder, recurrent, unspecified: Secondary | ICD-10-CM | POA: Diagnosis not present

## 2012-02-27 LAB — BASIC METABOLIC PANEL
BUN: 12 mg/dL (ref 7–18)
Chloride: 102 mmol/L (ref 98–107)
Co2: 26 mmol/L (ref 21–32)
EGFR (Non-African Amer.): >60
Glucose: 349 mg/dL — ABNORMAL HIGH (ref 65–99)
Potassium: 3.5 mmol/L (ref 3.5–5.1)
Sodium: 139 mmol/L (ref 136–145)

## 2012-02-27 LAB — CK TOTAL AND CKMB (NOT AT ARMC)
CK, Total: 47 U/L (ref 35–232)
CK-MB: 1.3 ng/mL (ref 0.5–3.6)

## 2012-03-03 DIAGNOSIS — L97409 Non-pressure chronic ulcer of unspecified heel and midfoot with unspecified severity: Secondary | ICD-10-CM | POA: Diagnosis not present

## 2012-03-10 ENCOUNTER — Telehealth: Payer: Self-pay

## 2012-03-10 DIAGNOSIS — L97909 Non-pressure chronic ulcer of unspecified part of unspecified lower leg with unspecified severity: Secondary | ICD-10-CM | POA: Diagnosis not present

## 2012-03-10 DIAGNOSIS — M216X9 Other acquired deformities of unspecified foot: Secondary | ICD-10-CM | POA: Diagnosis not present

## 2012-03-10 NOTE — Telephone Encounter (Signed)
Inocencio Homes @ [facility] with pre admit needs a cardiac clearance for this patient to have anesthesia for left foot surgery on Friday, March 14, 2012.  Please fax or call to let us know if cleared for foot surgery with anesthesia.  The patient is scheduled for a follow up with Dr. Kirke Corin on Thursday, March 13, 2012 to follow up from a cardiac cath as a new patient. Please advise if patient needs to cancel the surgery this Friday or if cleared.

## 2012-03-11 ENCOUNTER — Other Ambulatory Visit: Payer: Self-pay | Admitting: *Deleted

## 2012-03-11 ENCOUNTER — Encounter: Payer: Self-pay | Admitting: Internal Medicine

## 2012-03-11 ENCOUNTER — Ambulatory Visit (INDEPENDENT_AMBULATORY_CARE_PROVIDER_SITE_OTHER): Payer: Medicare Other | Admitting: Internal Medicine

## 2012-03-11 VITALS — BP 124/73 | HR 66 | Ht 72.0 in | Wt 246.0 lb

## 2012-03-11 DIAGNOSIS — I2510 Atherosclerotic heart disease of native coronary artery without angina pectoris: Secondary | ICD-10-CM | POA: Diagnosis not present

## 2012-03-11 DIAGNOSIS — E1169 Type 2 diabetes mellitus with other specified complication: Secondary | ICD-10-CM | POA: Diagnosis not present

## 2012-03-11 DIAGNOSIS — E785 Hyperlipidemia, unspecified: Secondary | ICD-10-CM | POA: Diagnosis not present

## 2012-03-11 DIAGNOSIS — L97509 Non-pressure chronic ulcer of other part of unspecified foot with unspecified severity: Secondary | ICD-10-CM

## 2012-03-11 DIAGNOSIS — I10 Essential (primary) hypertension: Secondary | ICD-10-CM

## 2012-03-11 DIAGNOSIS — L98499 Non-pressure chronic ulcer of skin of other sites with unspecified severity: Secondary | ICD-10-CM | POA: Diagnosis not present

## 2012-03-11 MED ORDER — ROSUVASTATIN CALCIUM 5 MG PO TABS: 5.0000 mg | ORAL_TABLET | Freq: Every day | ORAL | Status: DC

## 2012-03-11 NOTE — Telephone Encounter (Signed)
He is at low risk for planned foot surgery. However, both Aspirin and Plavix have to be continued due to recent drug eluting stent placement.

## 2012-03-11 NOTE — Assessment & Plan Note (Signed)
Stressed importance of good BG control especially with foot ulcer and pending surgery. Will continue current meds. Follow up 2 weeks, postop.

## 2012-03-11 NOTE — Assessment & Plan Note (Signed)
S/p recent drug-eluting stent placement D1 by Dr. Kirke Corin. No further chest pain or dyspnea. On plavix and aspirin. Will restart statin. Will try crestor, as had myaglia with lipitor in past. Goal LDL<70.  BP well controlled. Follow up 2 weeks.

## 2012-03-11 NOTE — Progress Notes (Signed)
Subjective:    Patient ID: Steve Phillips, male    DOB: May 12, 1946, 66 y.o.   MRN: [id number]  HPI 66 year old male with history of CAD, diabetes, hypertension, hyperlipidemia presents for followup. He was recently admitted to [facility] for chest pain and shortness of breath. He underwent cardiac catheterization and was found to have 90% stenosis D1. He is now status post stent placement of drug-eluting stent. He reports that he has generally been feeling well since discharge. He denies any recurrent chest pain or shortness of breath. He has been active, even able to mow his yard this week. He understands the importance of staying on Plavix and aspirin and has been compliant with these medications.  He notes that he is scheduled for surgical debridement of left foot diabetic ulcer on Friday. He failed conservative management including total contact cast. He is currently taking Keflex to help with possible infection. He notes that his podiatrist is aware that he was recently admitted and had stent placement. His podiatrist is planning to perform the surgery with him taking Plavix.  In regards to his diabetes, he notes his sugars have been slightly variable since discharge. He reports full compliance with his medications however. He denies any low blood sugars less than 80 or elevated blood sugars greater than 300.  Outpatient Encounter Prescriptions as of 03/11/2012  Medication Sig Dispense Refill  . amLODipine (NORVASC) 10 MG tablet Take 1 tablet (10 mg total) by mouth daily.  90 tablet  1  . aspirin 81 MG tablet Take 243 mg by mouth daily.       . carvedilol (COREG) 25 MG tablet Take 25 mg by mouth 2 (two) times daily.        . clopidogrel (PLAVIX) 75 MG tablet Take 1 tablet (75 mg total) by mouth daily.  90 tablet  2  . DULoxetine (CYMBALTA) 60 MG capsule Take 1 capsule (60 mg total) by mouth daily.  90 capsule  4  . glucose blood (ACCU-CHEK SMARTVIEW) test strip 1 each by  Other route 3 (three) times daily as needed for other. 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 hydrALAZINE (APRESOLINE) 100 MG tablet Take 50-100 mg by mouth 3 (three) times daily. As needed for Blood pressure 160 over 100 or greater      . insulin aspart (NOVOLOG FLEXPEN) 100 UNIT/ML injection Inject into the skin. Sliding scale      . insulin glargine (LANTUS) 100 UNIT/ML injection Inject 80 Units into the skin 2 (two) times daily.      . isosorbide mononitrate (IMDUR) 30 MG 24 hr tablet Take 30 mg by mouth daily.        Marland Kitchen lisinopril-hydrochlorothiazide (PRINZIDE,ZESTORETIC) 20-12.5 MG per tablet Take 1 tablet by mouth 2 (two) times daily.  180 tablet  4  . metFORMIN (GLUMETZA) 1000 MG (MOD) 24 hr tablet Take 1 tablet (1,000 mg total) by mouth 2 (two) times daily.  180 tablet  4  . NITROGLYCERIN PO Take by mouth. Sublingual....specific dose is unknown       . pantoprazole (PROTONIX) 40 MG tablet Take 40 mg by mouth daily.        . promethazine (PHENERGAN) 25 MG tablet Take 25 mg by mouth every 6 (six) hours as needed.      Marland Kitchen tiZANidine (ZANAFLEX) 4 MG tablet Take 1 tablet by mouth Three times a day.      . Eszopiclone (ESZOPICLONE) 3 MG TABS Take 1 tablet (  3 mg total) by mouth at bedtime. Take immediately before bedtime  30 tablet  0  . HYDROcodone-acetaminophen (VICODIN) 5-500 MG per tablet Take 2 tablets by mouth every 8 (eight) hours as needed for pain.  90 tablet  2  . rosuvastatin (CRESTOR) 5 MG tablet Take 1 tablet (5 mg total) by mouth daily.  90 tablet  3     Review of Systems  Constitutional: Negative for fever, chills, activity change, appetite change, fatigue and unexpected weight change.  Eyes: Negative for visual disturbance.  Respiratory: Negative for cough and shortness of breath.   Cardiovascular: Negative for chest pain, palpitations and leg swelling.  Gastrointestinal: Negative for abdominal pain and abdominal distention.  Genitourinary: Negative for dysuria, urgency  and difficulty urinating.  Musculoskeletal: Negative for arthralgias and gait problem.  Skin: Negative for color change and rash.  Hematological: Negative for adenopathy.  Psychiatric/Behavioral: Negative for sleep disturbance and dysphoric mood. The patient is not nervous/anxious.    BP 124/73  Pulse 66  Ht 6' (1.829 m)  Wt 246 lb (111.585 kg)  BMI 33.36 kg/m2  SpO2 98%     Objective:   Physical Exam  Constitutional: He is oriented to person, place, and time. He appears well-developed and well-nourished. No distress.  HENT:  Head: Normocephalic and atraumatic.  Right Ear: External ear normal.  Left Ear: External ear normal.  Nose: Nose normal.  Mouth/Throat: Oropharynx is clear and moist. No oropharyngeal exudate.  Eyes: Conjunctivae and EOM are normal. Pupils are equal, round, and reactive to light. Right eye exhibits no discharge. Left eye exhibits no discharge. No scleral icterus.  Neck: Normal range of motion. Neck supple. No tracheal deviation present. No thyromegaly present.  Cardiovascular: Normal rate, regular rhythm and normal heart sounds.  Exam reveals no gallop and no friction rub.   No murmur heard. Pulmonary/Chest: Effort normal and breath sounds normal. No respiratory distress. He has no wheezes. He has no rales. He exhibits no tenderness.  Musculoskeletal: Normal range of motion. He exhibits no edema.  Lymphadenopathy:    He has no cervical adenopathy.  Neurological: He is alert and oriented to person, place, and time. No cranial nerve deficit. Coordination normal.  Skin: Skin is warm and dry. No rash noted. He is not diaphoretic. No erythema. No pallor.  Psychiatric: He has a normal mood and affect. His behavior is normal. Judgment and thought content normal.          Assessment & Plan:

## 2012-03-11 NOTE — Telephone Encounter (Signed)
Notified Gayle @ [facility] a clearance letter will be faxed to [phone] regarding instructions on the patient's upcoming foot surgery.

## 2012-03-11 NOTE — Assessment & Plan Note (Signed)
Persistent ulcer, now scheduled for debridement. Will follow up here 2 weeks post-operatively. Discussed importance of good BG control for wound healing. Recent A1cs have been elevated above goal 7%.  Will bring record of BG to next visit. Check A1c with next visit.

## 2012-03-11 NOTE — Assessment & Plan Note (Signed)
BP well controlled. Will continue current meds. Follow up 2 weeks.

## 2012-03-13 ENCOUNTER — Ambulatory Visit (INDEPENDENT_AMBULATORY_CARE_PROVIDER_SITE_OTHER): Payer: Medicare Other | Admitting: Cardiovascular Disease

## 2012-03-13 ENCOUNTER — Encounter: Payer: Self-pay | Admitting: Cardiovascular Disease

## 2012-03-13 ENCOUNTER — Telehealth: Payer: Self-pay | Admitting: Internal Medicine

## 2012-03-13 VITALS — BP 180/90 | HR 76 | Ht 72.0 in | Wt 246.0 lb

## 2012-03-13 DIAGNOSIS — L98499 Non-pressure chronic ulcer of skin of other sites with unspecified severity: Secondary | ICD-10-CM

## 2012-03-13 DIAGNOSIS — I10 Essential (primary) hypertension: Secondary | ICD-10-CM

## 2012-03-13 DIAGNOSIS — I779 Disorder of arteries and arterioles, unspecified: Secondary | ICD-10-CM | POA: Diagnosis not present

## 2012-03-13 DIAGNOSIS — E1169 Type 2 diabetes mellitus with other specified complication: Secondary | ICD-10-CM | POA: Diagnosis not present

## 2012-03-13 DIAGNOSIS — I739 Peripheral vascular disease, unspecified: Secondary | ICD-10-CM

## 2012-03-13 DIAGNOSIS — E11621 Type 2 diabetes mellitus with foot ulcer: Secondary | ICD-10-CM

## 2012-03-13 DIAGNOSIS — E785 Hyperlipidemia, unspecified: Secondary | ICD-10-CM

## 2012-03-13 DIAGNOSIS — I2510 Atherosclerotic heart disease of native coronary artery without angina pectoris: Secondary | ICD-10-CM | POA: Diagnosis not present

## 2012-03-13 NOTE — Assessment & Plan Note (Signed)
The patient had recent unstable angina. He underwent catheterization followed by angioplasty and drug-eluting stent placement to the first diagonal. He does have residual mild to moderate coronary artery disease. His risk factors need to be treated aggressively. Aspirin can be decreased to 81 mg once daily. Continue Plavix 75 mg once daily for at least one year but preferably longer. He was deemed to be at an overall low risk for the planned foot surgery. However, both aspirin and Plavix cannot be interrupted.

## 2012-03-13 NOTE — Patient Instructions (Signed)
Decrease Aspirin to 81 mg once daily.  Your physician has requested that you have a lower extremity arterial duplex in 3 months.  During this test,  ultrasound is used to evaluate arterial blood flow in the legs. Allow one hour for this exam. There are no restrictions or special instructions. Follow up in 3 months.

## 2012-03-13 NOTE — Assessment & Plan Note (Signed)
The patient has a nonhealing foot ulcer on the left side. I think he needs to be evaluated for peripheral arterial disease as the culprit given his multiple risk factors. Thus, I will schedule him for an ABI and lower extremity Doppler to be done in 3 months from now.

## 2012-03-13 NOTE — Assessment & Plan Note (Signed)
His blood pressure is elevated today. It appears that he is anxious about surgery tomorrow. His blood pressure was normal yesterday and also was not really running high during his hospitalization. He is on multiple blood pressure medications and thus I would not make any changes today. He does complain of mild numbness in his right arm with some discomfort in the biceps area. This seems to be musculoskeletal or neuropathic in nature. He has no other associated neurologic symptoms.

## 2012-03-13 NOTE — Assessment & Plan Note (Signed)
The patient has history of intolerance to statins. He was started yesterday by Dr. Dan Humphreys on Crestor 5 mg once daily. I think there is a strong indication for treatment with a statin in his situation due to his coronary artery disease and diabetes. Hopefully he will be able to tolerate this medication. I recommend a target LDL of less than 70 if possible.

## 2012-03-13 NOTE — Progress Notes (Signed)
HPI  This is a 66 year old male who is here today for followup visit. He was seen recently at [facility] where he presented with unstable angina. He has known history of coronary artery disease status post multiple PCI in the past. He was being followed at [facility]. He ruled out for myocardial infarction by cardiac enzymes. He underwent cardiac catheterization which showed patent stents. However, there was a 90% stenosis in the proximal first diagonal. There was 40% disease in the mid LAD and moderate disease in the left circumflex. He underwent an angioplasty and drug-eluting stent placement to the diagonal without complications. He has been chest pain-free since then. He denies any dyspnea. He is anxious about anticipated foot surgery tomorrow. He has a nonhealing ulcer on the left foot. He supposedly was evaluated for peripheral arterial disease in the past more than 2 years ago.  He is complaining of  slight numbness in his right arm with some discomfort around the bicep area. He does not have any other associated symptoms.  Allergies  Allergen Reactions  . Codeine     Derivatives Nausea.  . Trazodone And Nefazodone      Current Outpatient Prescriptions on File Prior to Visit  Medication Sig Dispense Refill  . amLODipine (NORVASC) 10 MG tablet Take 1 tablet (10 mg total) by mouth daily.  90 tablet  1  . aspirin 81 MG tablet Take 243 mg by mouth daily.       . carvedilol (COREG) 25 MG tablet Take 25 mg by mouth 2 (two) times daily.        . clopidogrel (PLAVIX) 75 MG tablet Take 1 tablet (75 mg total) by mouth daily.  90 tablet  2  . DULoxetine (CYMBALTA) 60 MG capsule Take 1 capsule (60 mg total) by mouth daily.  90 capsule  4  . Eszopiclone (ESZOPICLONE) 3 MG TABS Take 1 tablet (3 mg total) by mouth at bedtime. Take immediately before bedtime  30 tablet  0  . glucose blood (ACCU-CHEK SMARTVIEW) test strip 1 each by Other route 3 (three) times daily as needed for other. DX: 250.00  Insulin dependent  100 each  12  . hydrALAZINE (APRESOLINE) 100 MG tablet Take 50-100 mg by mouth 3 (three) times daily. As needed for Blood pressure 160 over 100 or greater      . HYDROcodone-acetaminophen (VICODIN) 5-500 MG per tablet Take 2 tablets by mouth every 8 (eight) hours as needed for pain.  90 tablet  2  . insulin aspart (NOVOLOG FLEXPEN) 100 UNIT/ML injection Inject into the skin. Sliding scale      . insulin glargine (LANTUS) 100 UNIT/ML injection Inject 80 Units into the skin 2 (two) times daily.      . isosorbide mononitrate (IMDUR) 30 MG 24 hr tablet Take 30 mg by mouth daily.        Marland Kitchen lisinopril-hydrochlorothiazide (PRINZIDE,ZESTORETIC) 20-12.5 MG per tablet Take 1 tablet by mouth 2 (two) times daily.  180 tablet  4  . metFORMIN (GLUMETZA) 1000 MG (MOD) 24 hr tablet Take 1 tablet (1,000 mg total) by mouth 2 (two) times daily.  180 tablet  4  . NITROGLYCERIN PO Take by mouth. Sublingual....specific dose is unknown       . pantoprazole (PROTONIX) 40 MG tablet Take 40 mg by mouth daily.        . promethazine (PHENERGAN) 25 MG tablet Take 25 mg by mouth every 6 (six) hours as needed.      Marland Kitchen  rosuvastatin (CRESTOR) 5 MG tablet Take 1 tablet (5 mg total) by mouth daily.  90 tablet  3  . tiZANidine (ZANAFLEX) 4 MG tablet Take 1 tablet by mouth Three times a day.       Current Facility-Administered Medications on File Prior to Visit  Medication Dose Route Frequency Provider Last Rate Last Dose  . promethazine (PHENERGAN) injection 25 mg  25 mg Intramuscular Once Shelia Media, MD         Past Medical History  Diagnosis Date  . Shingles   . Diabetes mellitus   . Hyperlipidemia   . Hypertension   . Broken leg     left...s/p pins  . Lower leg pain     chronic ,left leg  . Acute angina   . Depression   . CAD (coronary artery disease)     s/p multiple PCI with stent RCA,LAD and obtuse marginal,followed @ [facility] on the accord study..followed her by [facility] cath 2010  w/microvascular disease, cath 02/2012 90% D1, s/p drug-eluting stent Dr. Kirke Corin     Past Surgical History  Procedure Date  . Appendectomy   . Cholecystectomy   . Left leg surgery   . Left foot surgery   . Drainage port in left testicle   . Cardiac catheterization 02/2012     History reviewed. No pertinent family history.   History   Social History  . Marital Status: Married    Spouse Name: N/A    Number of Children: N/A  . Years of Education: N/A   Occupational History  . Not on file.   Social History Main Topics  . Smoking status: Former Smoker    Quit date: 11/08/1971  . Smokeless tobacco: Never Used  . Alcohol Use: No  . Drug Use: No  . Sexually Active: Not on file   Other Topics Concern  . Not on file   Social History Narrative  . No narrative on file     PHYSICAL EXAM   BP 180/90  Pulse 76  Ht 6' (1.829 m)  Wt 246 lb (111.585 kg)  BMI 33.36 kg/m2 Constitutional: He is oriented to person, place, and time. He appears well-developed and well-nourished. No distress.  HENT: No nasal discharge.  Head: Normocephalic and atraumatic.  Eyes: Pupils are equal and round. Right eye exhibits no discharge. Left eye exhibits no discharge.  Neck: Normal range of motion. Neck supple. No JVD present. No thyromegaly present.  Cardiovascular: Normal rate, regular rhythm, normal heart sounds. Exam reveals no gallop and no friction rub. No murmur heard.  Pulmonary/Chest: Effort normal and breath sounds normal. No stridor. No respiratory distress. He has no wheezes. He has no rales. He exhibits no tenderness.  Abdominal: Soft. Bowel sounds are normal. He exhibits no distension. There is no tenderness. There is no rebound and no guarding.  Musculoskeletal: Normal range of motion. He exhibits no edema and no tenderness.  Neurological: He is alert and oriented to person, place, and time. Coordination normal.  Skin: Skin is warm and dry. No rash noted. He is not diaphoretic. No  erythema. No pallor.  Psychiatric: He has a normal mood and affect. His behavior is normal. Judgment and thought content normal.  Right groin: No hematoma or bruit.     EKG: Normal sinus rhythm with nonspecific T wave changes.   ASSESSMENT AND PLAN

## 2012-03-13 NOTE — Telephone Encounter (Addendum)
error 

## 2012-03-14 ENCOUNTER — Encounter: Payer: Medicare Other | Admitting: Cardiovascular Disease

## 2012-03-14 ENCOUNTER — Ambulatory Visit: Payer: Self-pay | Admitting: Podiatry

## 2012-03-14 DIAGNOSIS — Z794 Long term (current) use of insulin: Secondary | ICD-10-CM | POA: Diagnosis not present

## 2012-03-14 DIAGNOSIS — I10 Essential (primary) hypertension: Secondary | ICD-10-CM | POA: Diagnosis not present

## 2012-03-14 DIAGNOSIS — R609 Edema, unspecified: Secondary | ICD-10-CM | POA: Diagnosis not present

## 2012-03-14 DIAGNOSIS — E109 Type 1 diabetes mellitus without complications: Secondary | ICD-10-CM | POA: Diagnosis not present

## 2012-03-14 DIAGNOSIS — Z7902 Long term (current) use of antithrombotics/antiplatelets: Secondary | ICD-10-CM | POA: Diagnosis not present

## 2012-03-14 DIAGNOSIS — Q6600 Congenital talipes equinovarus, unspecified foot: Secondary | ICD-10-CM | POA: Diagnosis not present

## 2012-03-14 DIAGNOSIS — Z9861 Coronary angioplasty status: Secondary | ICD-10-CM | POA: Diagnosis not present

## 2012-03-14 DIAGNOSIS — I2510 Atherosclerotic heart disease of native coronary artery without angina pectoris: Secondary | ICD-10-CM | POA: Diagnosis not present

## 2012-03-14 DIAGNOSIS — K219 Gastro-esophageal reflux disease without esophagitis: Secondary | ICD-10-CM | POA: Diagnosis not present

## 2012-03-14 DIAGNOSIS — L97509 Non-pressure chronic ulcer of other part of unspecified foot with unspecified severity: Secondary | ICD-10-CM | POA: Diagnosis not present

## 2012-03-14 DIAGNOSIS — M6240 Contracture of muscle, unspecified site: Secondary | ICD-10-CM | POA: Diagnosis not present

## 2012-03-14 DIAGNOSIS — Z7982 Long term (current) use of aspirin: Secondary | ICD-10-CM | POA: Diagnosis not present

## 2012-03-14 DIAGNOSIS — D1630 Benign neoplasm of short bones of unspecified lower limb: Secondary | ICD-10-CM | POA: Diagnosis not present

## 2012-03-14 DIAGNOSIS — Z79899 Other long term (current) drug therapy: Secondary | ICD-10-CM | POA: Diagnosis not present

## 2012-03-14 DIAGNOSIS — M898X9 Other specified disorders of bone, unspecified site: Secondary | ICD-10-CM | POA: Diagnosis not present

## 2012-03-17 LAB — PATHOLOGY REPORT

## 2012-03-19 DIAGNOSIS — M216X9 Other acquired deformities of unspecified foot: Secondary | ICD-10-CM | POA: Diagnosis not present

## 2012-03-19 DIAGNOSIS — M898X9 Other specified disorders of bone, unspecified site: Secondary | ICD-10-CM | POA: Diagnosis not present

## 2012-03-19 DIAGNOSIS — M79609 Pain in unspecified limb: Secondary | ICD-10-CM | POA: Diagnosis not present

## 2012-03-26 DIAGNOSIS — E1142 Type 2 diabetes mellitus with diabetic polyneuropathy: Secondary | ICD-10-CM | POA: Diagnosis not present

## 2012-03-26 DIAGNOSIS — B351 Tinea unguium: Secondary | ICD-10-CM | POA: Diagnosis not present

## 2012-03-26 DIAGNOSIS — E1149 Type 2 diabetes mellitus with other diabetic neurological complication: Secondary | ICD-10-CM | POA: Diagnosis not present

## 2012-03-28 ENCOUNTER — Encounter: Payer: Self-pay | Admitting: Internal Medicine

## 2012-03-28 ENCOUNTER — Ambulatory Visit (INDEPENDENT_AMBULATORY_CARE_PROVIDER_SITE_OTHER): Payer: Medicare Other | Admitting: Internal Medicine

## 2012-03-28 VITALS — BP 130/80 | HR 64 | Temp 98.7°F | Ht 72.0 in

## 2012-03-28 DIAGNOSIS — E1169 Type 2 diabetes mellitus with other specified complication: Secondary | ICD-10-CM

## 2012-03-28 DIAGNOSIS — I2510 Atherosclerotic heart disease of native coronary artery without angina pectoris: Secondary | ICD-10-CM

## 2012-03-28 DIAGNOSIS — I10 Essential (primary) hypertension: Secondary | ICD-10-CM | POA: Diagnosis not present

## 2012-03-28 DIAGNOSIS — E119 Type 2 diabetes mellitus without complications: Secondary | ICD-10-CM | POA: Diagnosis not present

## 2012-03-28 DIAGNOSIS — L98499 Non-pressure chronic ulcer of skin of other sites with unspecified severity: Secondary | ICD-10-CM

## 2012-03-28 DIAGNOSIS — E11621 Type 2 diabetes mellitus with foot ulcer: Secondary | ICD-10-CM

## 2012-03-28 LAB — COMPREHENSIVE METABOLIC PANEL
AST: 22 U/L (ref 0–37)
Albumin: 3.4 g/dL — ABNORMAL LOW (ref 3.5–5.2)
Alkaline Phosphatase: 60 U/L (ref 39–117)
Chloride: 100 mEq/L (ref 96–112)
Glucose, Bld: 362 mg/dL — ABNORMAL HIGH (ref 70–99)
Potassium: 4 mEq/L (ref 3.5–5.1)
Sodium: 136 mEq/L (ref 135–145)
Total Protein: 6.4 g/dL (ref 6.0–8.3)

## 2012-03-28 NOTE — Progress Notes (Signed)
Subjective:    Patient ID: Steve Phillips, male    DOB: 01-07-1946, 66 y.o.   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 He reports that he is generally feeling well. He continues to wear a boot on his left lower leg after recent surgery to debride his foot ulcer. He denies any pain at the site. He continues to follow with podiatry. In regards to his blood sugars, he denies any elevated blood sugars greater than 250. He reports full compliance with his medication.  He was recently admitted to the hospital for CAD and had stent placed. He denies any further episodes of chest pain, diaphoresis, nausea. He reports his overall energy level is improving. He has not had any further headaches.  Outpatient Encounter Prescriptions as of 03/28/2012  Medication Sig Dispense Refill  . amLODipine (NORVASC) 10 MG tablet Take 1 tablet (10 mg total) by mouth daily.  90 tablet  1  . aspirin 81 MG tablet Take 243 mg by mouth daily.       . carvedilol (COREG) 25 MG tablet Take 25 mg by mouth 2 (two) times daily.        . clopidogrel (PLAVIX) 75 MG tablet Take 1 tablet (75 mg total) by mouth daily.  90 tablet  2  . DULoxetine (CYMBALTA) 60 MG capsule Take 1 capsule (60 mg total) by mouth daily.  90 capsule  4  . Eszopiclone (ESZOPICLONE) 3 MG TABS Take 1 tablet (3 mg total) by mouth at bedtime. Take immediately before bedtime  30 tablet  0  . glucose blood (ACCU-CHEK SMARTVIEW) test strip 1 each by Other route 3 (three) times daily as needed for other. 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 hydrALAZINE (APRESOLINE) 100 MG tablet Take 50-100 mg by mouth 3 (three) times daily. As needed for Blood pressure 160 over 100 or greater      . HYDROcodone-acetaminophen (VICODIN) 5-500 MG per tablet Take 2 tablets by mouth every 8 (eight) hours as needed for pain.  90 tablet  2  . insulin aspart  (NOVOLOG FLEXPEN) 100 UNIT/ML injection Inject into the skin. Sliding scale      . insulin glargine (LANTUS) 100 UNIT/ML injection Inject 80 Units into the skin 2 (two) times daily.      . isosorbide mononitrate (IMDUR) 30 MG 24 hr tablet Take 30 mg by mouth daily.        Marland Kitchen lisinopril-hydrochlorothiazide (PRINZIDE,ZESTORETIC) 20-12.5 MG per tablet Take 1 tablet by mouth 2 (two) times daily.  180 tablet  4  . metFORMIN (GLUMETZA) 1000 MG (MOD) 24 hr tablet Take 1 tablet (1,000 mg total) by mouth 2 (two) times daily.  180 tablet  4  . NITROGLYCERIN PO Take by mouth. Sublingual....specific dose is unknown       . pantoprazole (PROTONIX) 40 MG tablet Take 40 mg by mouth daily.        . promethazine (PHENERGAN) 25 MG tablet Take 25 mg by mouth every 6 (six) hours as needed.      . rosuvastatin (CRESTOR) 5 MG tablet Take 1 tablet (5 mg total) by mouth daily.  90 tablet  3  . tiZANidine (ZANAFLEX) 4 MG tablet Take 1 tablet by mouth Three times a day.       BP 130/80  Pulse 64  Temp(Src) 98.7 F (37.1 C) (Oral)  Ht 6' (1.829 m)  Review of Systems  Constitutional: Negative for fever, chills, activity change, appetite change, fatigue and unexpected weight change.  Eyes: Negative for visual disturbance.  Respiratory: Negative for cough and shortness of breath.   Cardiovascular: Negative for chest pain, palpitations and leg swelling.  Gastrointestinal: Negative for abdominal pain and abdominal distention.  Genitourinary: Negative for dysuria, urgency and difficulty urinating.  Musculoskeletal: Negative for arthralgias and gait problem.  Skin: Positive for wound. Negative for color change and rash.  Hematological: Negative for adenopathy.  Psychiatric/Behavioral: Negative for sleep disturbance and dysphoric mood. The patient is not nervous/anxious.        Objective:   Physical Exam  Constitutional: He is oriented to person, place, and time. He appears well-developed and well-nourished. No  distress.  HENT:  Head: Normocephalic and atraumatic.  Right Ear: External ear normal.  Left Ear: External ear normal.  Nose: Nose normal.  Mouth/Throat: Oropharynx is clear and moist. No oropharyngeal exudate.  Eyes: Conjunctivae and EOM are normal. Pupils are equal, round, and reactive to light. Right eye exhibits no discharge. Left eye exhibits no discharge. No scleral icterus.  Neck: Normal range of motion. Neck supple. No tracheal deviation present. No thyromegaly present.  Cardiovascular: Normal rate, regular rhythm and normal heart sounds.  Exam reveals no gallop and no friction rub.   No murmur heard. Pulmonary/Chest: Effort normal and breath sounds normal. No respiratory distress. He has no wheezes. He has no rales. He exhibits no tenderness.  Musculoskeletal: Normal range of motion. He exhibits no edema.  Lymphadenopathy:    He has no cervical adenopathy.  Neurological: He is alert and oriented to person, place, and time. No cranial nerve deficit. Coordination normal.  Skin: Skin is warm and dry. No rash noted. He is not diaphoretic. No erythema. No pallor.  Psychiatric: He has a normal mood and affect. His behavior is normal. Judgment and thought content normal.          Assessment & Plan:

## 2012-03-28 NOTE — Assessment & Plan Note (Signed)
Patient reports good control of blood sugars. Will check hemoglobin A1c today. Followup 3 months.

## 2012-03-28 NOTE — Assessment & Plan Note (Signed)
Currently asymptomatic. Reports full compliance with medications including Plavix and aspirin. Will continue to monitor.

## 2012-03-28 NOTE — Assessment & Plan Note (Signed)
Blood pressure is well-controlled today. We'll plan to continue current medications. Will check renal function with labs. Followup in 3 months.

## 2012-03-28 NOTE — Assessment & Plan Note (Signed)
Status post debridement. Will get records from his podiatrist.

## 2012-04-09 ENCOUNTER — Ambulatory Visit: Payer: Medicare Other | Admitting: Internal Medicine

## 2012-04-09 ENCOUNTER — Ambulatory Visit (INDEPENDENT_AMBULATORY_CARE_PROVIDER_SITE_OTHER): Payer: Medicare Other | Admitting: Internal Medicine

## 2012-04-09 ENCOUNTER — Encounter: Payer: Self-pay | Admitting: Internal Medicine

## 2012-04-09 DIAGNOSIS — E119 Type 2 diabetes mellitus without complications: Secondary | ICD-10-CM | POA: Diagnosis not present

## 2012-04-09 DIAGNOSIS — M25519 Pain in unspecified shoulder: Secondary | ICD-10-CM

## 2012-04-09 DIAGNOSIS — E785 Hyperlipidemia, unspecified: Secondary | ICD-10-CM

## 2012-04-09 DIAGNOSIS — M25511 Pain in right shoulder: Secondary | ICD-10-CM

## 2012-04-09 DIAGNOSIS — D649 Anemia, unspecified: Secondary | ICD-10-CM | POA: Diagnosis not present

## 2012-04-09 DIAGNOSIS — R269 Unspecified abnormalities of gait and mobility: Secondary | ICD-10-CM

## 2012-04-09 DIAGNOSIS — E538 Deficiency of other specified B group vitamins: Secondary | ICD-10-CM | POA: Diagnosis not present

## 2012-04-09 DIAGNOSIS — I10 Essential (primary) hypertension: Secondary | ICD-10-CM

## 2012-04-09 DIAGNOSIS — E039 Hypothyroidism, unspecified: Secondary | ICD-10-CM

## 2012-04-09 LAB — CBC WITH DIFFERENTIAL/PLATELET
Basophils Absolute: 0 10*3/uL (ref 0.0–0.1)
Eosinophils Absolute: 0.4 10*3/uL (ref 0.0–0.7)
HCT: 42.9 % (ref 39.0–52.0)
Hemoglobin: 14.2 g/dL (ref 13.0–17.0)
Lymphs Abs: 1.7 10*3/uL (ref 0.7–4.0)
MCHC: 33.1 g/dL (ref 30.0–36.0)
Monocytes Absolute: 0.6 10*3/uL (ref 0.1–1.0)
Neutro Abs: 7.6 10*3/uL (ref 1.4–7.7)
RDW: 17.3 % — ABNORMAL HIGH (ref 11.5–14.6)

## 2012-04-09 MED ORDER — CYANOCOBALAMIN 1000 MCG/ML IJ SOLN
1000.0000 ug | Freq: Once | INTRAMUSCULAR | Status: AC
  Administered 2012-04-09: 1000 ug via INTRAMUSCULAR

## 2012-04-09 MED ORDER — INSULIN ASPART 100 UNIT/ML ~~LOC~~ SOLN: 4.0000 [IU] | Freq: Three times a day (TID) | SUBCUTANEOUS | Status: DC

## 2012-04-09 NOTE — Progress Notes (Signed)
Addended by: Jobie Quaker on: 04/09/2012 03:45 PM   Modules accepted: Orders

## 2012-04-09 NOTE — Assessment & Plan Note (Addendum)
Patient has several reasons for unstable gait including diabetic neuropathy. Neurology had recommended EMG testing and physical therapy. Patient would like to hold off on this for now until acute issues with diabetes have resolved. Vitamin B12 level was noted to be mildly low on labs earlier this year. Will repeat CBC and b12 level today.

## 2012-04-09 NOTE — Progress Notes (Signed)
Subjective:    Patient ID: Steve Phillips, male    DOB: May 06, 1946, 66 y.o.   MRN: [id number]  HPI 66 year old male with history of diabetes, and recent debridement of diabetic foot ulcer presents for followup. In regards to his diabetes, recent hemoglobin A1c was markedly elevated at 11%. He reports some dietary indiscretion. He reports full compliance with Lantus 80 units twice daily. He also intermittently uses NovoLog when eating a large meal. He reports that recently he has been more sedentary with his recent foot surgery. He reports that the wound is healing well. He continues to have his left foot wrapped in a bandage. He denies any recent fever, chills, or other symptoms.  He is also concerned today about right shoulder pain. He reports pain with movement of his right arm. He does not have weakness in his right arm. There is no known injury to his right shoulder. This is been ongoing for several months and has gradually gotten worse. Symptoms are improved somewhat with the use of Zanaflex at night.  Outpatient Encounter Prescriptions as of 04/09/2012  Medication Sig Dispense Refill  . amLODipine (NORVASC) 10 MG tablet Take 1 tablet (10 mg total) by mouth daily.  90 tablet  1  . aspirin 81 MG tablet Take 243 mg by mouth daily.       . carvedilol (COREG) 25 MG tablet Take 25 mg by mouth 2 (two) times daily.        . clopidogrel (PLAVIX) 75 MG tablet Take 1 tablet (75 mg total) by mouth daily.  90 tablet  2  . DULoxetine (CYMBALTA) 60 MG capsule Take 1 capsule (60 mg total) by mouth daily.  90 capsule  4  . glucose blood (ACCU-CHEK SMARTVIEW) test strip 1 each by Other route 3 (three) times daily as needed for other. 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 hydrALAZINE (APRESOLINE) 100 MG tablet Take 50-100 mg by mouth 3 (three) times daily. As needed for Blood pressure 160 over 100 or greater      . HYDROcodone-acetaminophen (VICODIN) 5-500 MG per tablet Take 2 tablets by mouth every 8  (eight) hours as needed for pain.  90 tablet  2  . insulin aspart (NOVOLOG FLEXPEN) 100 UNIT/ML injection Inject 4 Units into the skin 3 (three) times daily before meals. Sliding scale  3 vial  3  . insulin glargine (LANTUS) 100 UNIT/ML injection Inject 80 Units into the skin 2 (two) times daily.      . isosorbide mononitrate (IMDUR) 30 MG 24 hr tablet Take 30 mg by mouth daily.        Marland Kitchen lisinopril-hydrochlorothiazide (PRINZIDE,ZESTORETIC) 20-12.5 MG per tablet Take 1 tablet by mouth 2 (two) times daily.  180 tablet  4  . metFORMIN (GLUMETZA) 1000 MG (MOD) 24 hr tablet Take 1 tablet (1,000 mg total) by mouth 2 (two) times daily.  180 tablet  4  . NITROGLYCERIN PO Take by mouth. Sublingual....specific dose is unknown       . pantoprazole (PROTONIX) 40 MG tablet Take 40 mg by mouth daily.        . promethazine (PHENERGAN) 25 MG tablet Take 25 mg by mouth every 6 (six) hours as needed.      . rosuvastatin (CRESTOR) 5 MG tablet Take 1 tablet (5 mg total) by mouth daily.  90 tablet  3  . tiZANidine (ZANAFLEX) 4 MG tablet Take 1 tablet by mouth Three times a day.      Marland Kitchen  DISCONTD: insulin aspart (NOVOLOG FLEXPEN) 100 UNIT/ML injection Inject into the skin. Sliding scale      . Eszopiclone (ESZOPICLONE) 3 MG TABS Take 1 tablet (3 mg total) by mouth at bedtime. Take immediately before bedtime  30 tablet  0     Review of Systems  Constitutional: Negative for fever, chills, activity change, appetite change, fatigue and unexpected weight change.  Eyes: Negative for visual disturbance.  Respiratory: Negative for cough and shortness of breath.   Cardiovascular: Negative for chest pain, palpitations and leg swelling.  Gastrointestinal: Negative for abdominal pain and abdominal distention.  Genitourinary: Negative for dysuria, urgency and difficulty urinating.  Musculoskeletal: Positive for arthralgias. Negative for gait problem.  Skin: Negative for color change and rash.  Hematological: Negative for  adenopathy.  Psychiatric/Behavioral: Negative for sleep disturbance and dysphoric mood. The patient is not nervous/anxious.        Objective:   Physical Exam  Constitutional: He is oriented to person, place, and time. He appears well-developed and well-nourished. No distress.  HENT:  Head: Normocephalic and atraumatic.  Right Ear: External ear normal.  Left Ear: External ear normal.  Nose: Nose normal.  Mouth/Throat: Oropharynx is clear and moist. No oropharyngeal exudate.  Eyes: Conjunctivae and EOM are normal. Pupils are equal, round, and reactive to light. Right eye exhibits no discharge. Left eye exhibits no discharge. No scleral icterus.  Neck: Normal range of motion. Neck supple. No tracheal deviation present. No thyromegaly present.  Cardiovascular: Normal rate, regular rhythm and normal heart sounds.  Exam reveals no gallop and no friction rub.   No murmur heard. Pulmonary/Chest: Effort normal and breath sounds normal. No respiratory distress. He has no wheezes. He has no rales. He exhibits no tenderness.  Musculoskeletal: He exhibits no edema.       Right shoulder: He exhibits decreased range of motion and pain.  Lymphadenopathy:    He has no cervical adenopathy.  Neurological: He is alert and oriented to person, place, and time. No cranial nerve deficit. Coordination normal.  Skin: Skin is warm and dry. No rash noted. He is not diaphoretic. No erythema. No pallor.  Psychiatric: He has a normal mood and affect. His behavior is normal. Judgment and thought content normal.          Assessment & Plan:

## 2012-04-09 NOTE — Assessment & Plan Note (Signed)
Tolerating Crestor well. Samples given today. Goal LDL less than 70. Will plan to recheck lipids at visit next month.

## 2012-04-09 NOTE — Assessment & Plan Note (Signed)
Blood pressure is elevated today. Historically has been better controlled. Question if elevation may be secondary to recent increased salt intake. Patient reports compliance with medication. Will recheck blood pressure at return visit in one month.

## 2012-04-09 NOTE — Assessment & Plan Note (Signed)
Very poor control of blood sugars with recent A1c of 11%. Encouraged improved compliance with diet and limited intake of processed carbohydrates. Will leave insulin at current dose, Lantus 80units bid. Question if he might benefit from insulin pump. Will set him up with endocrinology for further evaluation. Followup here in one month.

## 2012-04-09 NOTE — Assessment & Plan Note (Signed)
Patient with gait abnormality and history of B12 deficiency in the past. Most recent lab showed B12 level of 278 which is mildly low. Will repeat B12 level and CBC today and give single injection of B12.Follow up 1 month.

## 2012-04-15 ENCOUNTER — Telehealth: Payer: Self-pay | Admitting: Internal Medicine

## 2012-04-15 ENCOUNTER — Ambulatory Visit: Payer: Self-pay | Admitting: Orthopedic Surgery

## 2012-04-15 DIAGNOSIS — M7500 Adhesive capsulitis of unspecified shoulder: Secondary | ICD-10-CM | POA: Diagnosis not present

## 2012-04-15 DIAGNOSIS — M25579 Pain in unspecified ankle and joints of unspecified foot: Secondary | ICD-10-CM | POA: Diagnosis not present

## 2012-04-15 DIAGNOSIS — M7989 Other specified soft tissue disorders: Secondary | ICD-10-CM | POA: Diagnosis not present

## 2012-04-15 DIAGNOSIS — M79609 Pain in unspecified limb: Secondary | ICD-10-CM | POA: Diagnosis not present

## 2012-04-15 NOTE — Telephone Encounter (Signed)
[facility] and spoke w/Debbie in appointments about this matter [with Dr. Carney Living and our mutual patient; she spoke w/MD's MA and came back to phone stating that MA will discuss this w/Dr. Carney Living and call us back; reiterated the urgency of this matter that should have been taken care of when noticed at their office this AM & that patient is at home with same issues in left leg & his PCP does not work in the office on Tuesday afternoons; therefore it is of the utmost importance that patient gets Imaging done via their office today to R/O DVT. They will call back as soon as response is received from MD/SLS

## 2012-04-15 NOTE — Telephone Encounter (Signed)
Carvedilol 25 mg two times a day generic of [place] needing a new prescription.

## 2012-04-15 NOTE — Telephone Encounter (Signed)
Dr. Carlena Hurl orthopedics wants patient checked for blood clots in left leg.

## 2012-04-15 NOTE — Telephone Encounter (Signed)
They should set up US of the leg from their office, as this is an urgent study and I am out this afternoon.

## 2012-04-15 NOTE — Telephone Encounter (Signed)
Dr. Carney Living returned our call and stated that "he would be glad to help [place] out by ordering that [place]" after we discussed that this was an urgent matter that Dr. Dan Humphreys felt should have been handled while in his office this morning while patient was there [after he stated that he informed the patient to contact his PCP to set up [place] as he felt this was a venous issue/possible DVT]. He inquired as to the closest facility for [place] and was given [facility], he asked if Dr. Dan Humphreys would be in tomorrow morning and when informed at 8:00am, he stated "thank you".? Patient informed; will call Dr. Herschell Dimes office back at 4:30pm if they have not heard from them.

## 2012-04-16 ENCOUNTER — Telehealth: Payer: Self-pay | Admitting: Internal Medicine

## 2012-04-16 MED ORDER — CARVEDILOL 25 MG PO TABS: 25.0000 mg | ORAL_TABLET | Freq: Two times a day (BID) | ORAL | Status: DC

## 2012-04-16 NOTE — Telephone Encounter (Signed)
Refill on Carvedilol 25 mg

## 2012-04-18 ENCOUNTER — Other Ambulatory Visit: Payer: Self-pay | Admitting: Internal Medicine

## 2012-04-18 DIAGNOSIS — Z1211 Encounter for screening for malignant neoplasm of colon: Secondary | ICD-10-CM

## 2012-04-21 ENCOUNTER — Encounter: Payer: Self-pay | Admitting: Internal Medicine

## 2012-04-21 ENCOUNTER — Ambulatory Visit (INDEPENDENT_AMBULATORY_CARE_PROVIDER_SITE_OTHER): Payer: Medicare Other | Admitting: Internal Medicine

## 2012-04-21 VITALS — BP 170/90 | HR 69 | Temp 98.0°F | Ht 72.0 in | Wt 248.0 lb

## 2012-04-21 DIAGNOSIS — E538 Deficiency of other specified B group vitamins: Secondary | ICD-10-CM

## 2012-04-21 DIAGNOSIS — E1142 Type 2 diabetes mellitus with diabetic polyneuropathy: Secondary | ICD-10-CM | POA: Diagnosis not present

## 2012-04-21 DIAGNOSIS — Z1211 Encounter for screening for malignant neoplasm of colon: Secondary | ICD-10-CM

## 2012-04-21 DIAGNOSIS — D509 Iron deficiency anemia, unspecified: Secondary | ICD-10-CM | POA: Insufficient documentation

## 2012-04-21 DIAGNOSIS — R5383 Other fatigue: Secondary | ICD-10-CM

## 2012-04-21 DIAGNOSIS — E1149 Type 2 diabetes mellitus with other diabetic neurological complication: Secondary | ICD-10-CM | POA: Diagnosis not present

## 2012-04-21 DIAGNOSIS — I10 Essential (primary) hypertension: Secondary | ICD-10-CM

## 2012-04-21 DIAGNOSIS — R5381 Other malaise: Secondary | ICD-10-CM | POA: Diagnosis not present

## 2012-04-21 NOTE — Assessment & Plan Note (Signed)
Pt notes some improvement in fatigue with B12 supplementation. Will continue. Follow up 1 month.

## 2012-04-21 NOTE — Assessment & Plan Note (Signed)
BP elevated today. Suspect related to dietary indiscretion, as better controlled in past.Will check renal function with labs today. Follow up 2-4 weeks.

## 2012-04-21 NOTE — Progress Notes (Signed)
Subjective:    Patient ID: Steve Phillips, male    DOB: 06/29/1946, 66 y.o.   MRN: [id number]  HPI 66 year old male with history of diabetes, hypertension, and coronary artery disease presents for followup. His primary concern today is fatigue. He reports generalized fatigue but denies any focal symptoms such as shortness of breath, chest pain. His recent lab work showed slightly low B12 levels. He was started on B12 replacement. He reports some improvement in fatigue after his first B12 shot.  In regards to his diabetes, he reports that his blood sugars have been fairly well-controlled, however he did not bring a record today of his sugars. Recent hemoglobin A1c was 11%. He reports that the wound on his left foot has healed. He continues to have gait instability and burning in his bilateral lower extremities.  Outpatient Encounter Prescriptions as of 04/21/2012  Medication Sig Dispense Refill  . amLODipine (NORVASC) 10 MG tablet Take 1 tablet (10 mg total) by mouth daily.  90 tablet  1  . aspirin 81 MG tablet Take 243 mg by mouth daily.       . carvedilol (COREG) 25 MG tablet Take 1 tablet (25 mg total) by mouth 2 (two) times daily.  60 tablet  4  . clopidogrel (PLAVIX) 75 MG tablet Take 1 tablet (75 mg total) by mouth daily.  90 tablet  2  . DULoxetine (CYMBALTA) 60 MG capsule Take 1 capsule (60 mg total) by mouth daily.  90 capsule  4  . Eszopiclone (ESZOPICLONE) 3 MG TABS Take 1 tablet (3 mg total) by mouth at bedtime. Take immediately before bedtime  30 tablet  0  . glucose blood (ACCU-CHEK SMARTVIEW) test strip 1 each by Other route 3 (three) times daily as needed for other. 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 hydrALAZINE (APRESOLINE) 100 MG tablet Take 50-100 mg by mouth 3 (three) times daily. As needed for Blood pressure 160 over 100 or greater      . HYDROcodone-acetaminophen (VICODIN) 5-500 MG per tablet Take 2 tablets by mouth every 8 (eight) hours as needed for pain.  90 tablet   2  . insulin aspart (NOVOLOG FLEXPEN) 100 UNIT/ML injection Inject 4 Units into the skin 3 (three) times daily before meals. Sliding scale  3 vial  3  . insulin glargine (LANTUS) 100 UNIT/ML injection Inject 80 Units into the skin 2 (two) times daily.      . isosorbide mononitrate (IMDUR) 30 MG 24 hr tablet Take 30 mg by mouth daily.        Marland Kitchen lisinopril-hydrochlorothiazide (PRINZIDE,ZESTORETIC) 20-12.5 MG per tablet Take 1 tablet by mouth 2 (two) times daily.  180 tablet  4  . metFORMIN (GLUMETZA) 1000 MG (MOD) 24 hr tablet Take 1 tablet (1,000 mg total) by mouth 2 (two) times daily.  180 tablet  4  . NITROGLYCERIN PO Take by mouth. Sublingual....specific dose is unknown       . pantoprazole (PROTONIX) 40 MG tablet Take 40 mg by mouth daily.        . promethazine (PHENERGAN) 25 MG tablet Take 25 mg by mouth every 6 (six) hours as needed.      . rosuvastatin (CRESTOR) 5 MG tablet Take 1 tablet (5 mg total) by mouth daily.  90 tablet  3  . tiZANidine (ZANAFLEX) 4 MG tablet Take 1 tablet by mouth Three times a day.        BP 170/90  Pulse 69  Temp(Src) 98  F (36.7 C) (Oral)  Ht 6' (1.829 m)  Wt 248 lb (112.492 kg)  BMI 33.63 kg/m2  SpO2 95%  Review of Systems  Constitutional: Positive for fatigue. Negative for fever, chills, activity change, appetite change and unexpected weight change.  Eyes: Negative for visual disturbance.  Respiratory: Negative for cough and shortness of breath.   Cardiovascular: Negative for chest pain, palpitations and leg swelling.  Gastrointestinal: Negative for abdominal pain and abdominal distention.  Genitourinary: Negative for dysuria, urgency and difficulty urinating.  Musculoskeletal: Positive for myalgias, arthralgias and gait problem.  Skin: Negative for color change and rash.  Neurological: Positive for weakness.  Hematological: Negative for adenopathy.  Psychiatric/Behavioral: Negative for sleep disturbance and dysphoric mood. The patient is not  nervous/anxious.        Objective:   Physical Exam  Constitutional: He is oriented to person, place, and time. He appears well-developed and well-nourished. No distress.  HENT:  Head: Normocephalic and atraumatic.  Right Ear: External ear normal.  Left Ear: External ear normal.  Nose: Nose normal.  Mouth/Throat: Oropharynx is clear and moist. No oropharyngeal exudate.  Eyes: Conjunctivae and EOM are normal. Pupils are equal, round, and reactive to light. Right eye exhibits no discharge. Left eye exhibits no discharge. No scleral icterus.  Neck: Normal range of motion. Neck supple. No tracheal deviation present. No thyromegaly present.  Cardiovascular: Normal rate, regular rhythm and normal heart sounds.  Exam reveals no gallop and no friction rub.   No murmur heard. Pulmonary/Chest: Effort normal and breath sounds normal. No respiratory distress. He has no wheezes. He has no rales. He exhibits no tenderness.  Musculoskeletal: Normal range of motion. He exhibits no edema.  Lymphadenopathy:    He has no cervical adenopathy.  Neurological: He is alert and oriented to person, place, and time. No cranial nerve deficit. Coordination normal.  Skin: Skin is warm and dry. No rash noted. He is not diaphoretic. No erythema. No pallor.  Psychiatric: He has a normal mood and affect. His behavior is normal. Judgment and thought content normal.          Assessment & Plan:

## 2012-04-21 NOTE — Assessment & Plan Note (Addendum)
Recent hemoglobin A1c was very poorly controlled at 11%. He did not bring a record of blood sugars today as requested. He reports compliance with his insulin Lantus 80 units twice daily. As previous notes, question if he would benefit from insulin pump. Endocrine referral in process.

## 2012-04-21 NOTE — Assessment & Plan Note (Signed)
Stool hemocult pending. Pt has refused screening colonoscopy in the past, but agrees to proceed today, will schedule. Repeat CBC with labs next visit.

## 2012-04-21 NOTE — Assessment & Plan Note (Signed)
Likely multifactorial. Poorly controlled diabetes, obesity, CAD, iron def anemia, mild B12 deficiency.  Encouraged better compliance with diet, exercise program.  Encouraged follow through on GI evaluation for iron deficiency anemia. Will continue B12 supplementation. Follow up 1 month.

## 2012-04-22 ENCOUNTER — Other Ambulatory Visit: Payer: Medicare Other

## 2012-04-22 DIAGNOSIS — R5383 Other fatigue: Secondary | ICD-10-CM | POA: Diagnosis not present

## 2012-04-22 DIAGNOSIS — R5381 Other malaise: Secondary | ICD-10-CM | POA: Diagnosis not present

## 2012-04-22 DIAGNOSIS — Z1211 Encounter for screening for malignant neoplasm of colon: Secondary | ICD-10-CM

## 2012-04-22 MED ORDER — CYANOCOBALAMIN 1000 MCG/ML IJ SOLN
1000.0000 ug | Freq: Once | INTRAMUSCULAR | Status: AC
  Administered 2012-04-22: 1000 ug via INTRAMUSCULAR

## 2012-04-22 NOTE — Progress Notes (Signed)
Addended by: Jobie Quaker on: 04/22/2012 08:13 AM   Modules accepted: Orders

## 2012-04-23 ENCOUNTER — Encounter: Payer: Self-pay | Admitting: Gastroenterology

## 2012-04-23 ENCOUNTER — Telehealth: Payer: Self-pay | Admitting: *Deleted

## 2012-04-23 NOTE — Telephone Encounter (Signed)
Patient's stool test was positive for blood. He has a colonoscopy scheduled in July, but Dr.Walker is asking for this to be moved up sooner if possible. Can you please try and get him in sooner? Thanks.

## 2012-04-23 NOTE — Telephone Encounter (Signed)
Patient returned you call. 

## 2012-04-23 NOTE — Telephone Encounter (Signed)
I did not call patient. He has already been notified of stool sample. Erie Noe was calling him to reschedule the colonoscopy. Erie Noe will call him back.

## 2012-04-25 ENCOUNTER — Telehealth: Payer: Self-pay | Admitting: Internal Medicine

## 2012-04-25 NOTE — Telephone Encounter (Signed)
Then, I would start with Novolog 5 units for BG>250 prior to meals. We will titrate up after that.

## 2012-04-25 NOTE — Telephone Encounter (Signed)
Patient's [facility] log  came with the wrong instructions and amounts Ref# [id number] Patient can inject up to 25 units and they only had 4 units on the instructions he will run out if it's not corrected . Please call the Clinic Dept [phone] and correct the medication so he will not be charged his Rx # is [id number].

## 2012-04-25 NOTE — Telephone Encounter (Signed)
Please advise on what patient's instructions are supposed to be.

## 2012-04-25 NOTE — Telephone Encounter (Signed)
Wife states that patient no longer sees Dr. Tedd Sias.  Please advise.

## 2012-04-25 NOTE — Telephone Encounter (Signed)
He should have instructions from Dr. Tedd Sias. Can we ask him to contact her office for specific instructions about sliding scale?

## 2012-04-25 NOTE — Telephone Encounter (Signed)
Patient doesn't see Dr. Alesia Morin. What instructions should be given.

## 2012-04-28 ENCOUNTER — Other Ambulatory Visit: Payer: Self-pay | Admitting: *Deleted

## 2012-04-28 MED ORDER — INSULIN ASPART 100 UNIT/ML ~~LOC~~ SOLN: SUBCUTANEOUS | Status: DC

## 2012-04-28 NOTE — Telephone Encounter (Signed)
Rx corrected with pharmacy.

## 2012-04-29 ENCOUNTER — Ambulatory Visit (INDEPENDENT_AMBULATORY_CARE_PROVIDER_SITE_OTHER): Payer: Medicare Other | Admitting: Internal Medicine

## 2012-04-29 DIAGNOSIS — E538 Deficiency of other specified B group vitamins: Secondary | ICD-10-CM

## 2012-04-29 MED ORDER — CYANOCOBALAMIN 1000 MCG/ML IJ SOLN
1000.0000 ug | Freq: Once | INTRAMUSCULAR | Status: AC
  Administered 2012-04-29: 1000 ug via INTRAMUSCULAR

## 2012-04-30 ENCOUNTER — Ambulatory Visit: Payer: Medicare Other

## 2012-05-02 ENCOUNTER — Other Ambulatory Visit: Payer: Self-pay | Admitting: Gastroenterology

## 2012-05-02 DIAGNOSIS — R195 Other fecal abnormalities: Secondary | ICD-10-CM | POA: Diagnosis not present

## 2012-05-02 DIAGNOSIS — K219 Gastro-esophageal reflux disease without esophagitis: Secondary | ICD-10-CM | POA: Diagnosis not present

## 2012-05-12 ENCOUNTER — Ambulatory Visit: Payer: Medicare Other

## 2012-05-13 ENCOUNTER — Ambulatory Visit (INDEPENDENT_AMBULATORY_CARE_PROVIDER_SITE_OTHER): Payer: Medicare Other | Admitting: Internal Medicine

## 2012-05-13 ENCOUNTER — Ambulatory Visit: Payer: Self-pay | Admitting: Gastroenterology

## 2012-05-13 DIAGNOSIS — E538 Deficiency of other specified B group vitamins: Secondary | ICD-10-CM

## 2012-05-13 DIAGNOSIS — K449 Diaphragmatic hernia without obstruction or gangrene: Secondary | ICD-10-CM | POA: Diagnosis not present

## 2012-05-13 DIAGNOSIS — K219 Gastro-esophageal reflux disease without esophagitis: Secondary | ICD-10-CM | POA: Diagnosis not present

## 2012-05-13 MED ORDER — CYANOCOBALAMIN 1000 MCG/ML IJ SOLN
1000.0000 ug | Freq: Once | INTRAMUSCULAR | Status: AC
  Administered 2012-05-13: 1000 ug via INTRAMUSCULAR

## 2012-05-16 ENCOUNTER — Ambulatory Visit: Payer: Medicare Other | Admitting: Gastroenterology

## 2012-05-18 ENCOUNTER — Ambulatory Visit: Payer: Self-pay | Admitting: Family Medicine

## 2012-05-18 DIAGNOSIS — M25469 Effusion, unspecified knee: Secondary | ICD-10-CM | POA: Diagnosis not present

## 2012-05-18 DIAGNOSIS — S8000XA Contusion of unspecified knee, initial encounter: Secondary | ICD-10-CM | POA: Diagnosis not present

## 2012-05-21 ENCOUNTER — Encounter: Payer: Self-pay | Admitting: Internal Medicine

## 2012-05-21 ENCOUNTER — Telehealth: Payer: Self-pay | Admitting: *Deleted

## 2012-05-21 ENCOUNTER — Ambulatory Visit (INDEPENDENT_AMBULATORY_CARE_PROVIDER_SITE_OTHER): Payer: Medicare Other | Admitting: Internal Medicine

## 2012-05-21 VITALS — BP 162/92 | HR 76 | Temp 98.5°F | Ht 72.0 in

## 2012-05-21 DIAGNOSIS — E538 Deficiency of other specified B group vitamins: Secondary | ICD-10-CM

## 2012-05-21 DIAGNOSIS — R269 Unspecified abnormalities of gait and mobility: Secondary | ICD-10-CM

## 2012-05-21 DIAGNOSIS — E1149 Type 2 diabetes mellitus with other diabetic neurological complication: Secondary | ICD-10-CM | POA: Diagnosis not present

## 2012-05-21 DIAGNOSIS — D509 Iron deficiency anemia, unspecified: Secondary | ICD-10-CM

## 2012-05-21 DIAGNOSIS — R5383 Other fatigue: Secondary | ICD-10-CM

## 2012-05-21 DIAGNOSIS — E1142 Type 2 diabetes mellitus with diabetic polyneuropathy: Secondary | ICD-10-CM

## 2012-05-21 MED ORDER — CYANOCOBALAMIN 1000 MCG/ML IJ SOLN
1000.0000 ug | Freq: Once | INTRAMUSCULAR | Status: AC
  Administered 2012-05-21: 1000 ug via INTRAMUSCULAR

## 2012-05-21 MED ORDER — GLUCOSE BLOOD VI STRP: ORAL_STRIP | Status: DC

## 2012-05-21 NOTE — Telephone Encounter (Signed)
He has a follow up already scheduled in 06/2012.  I just want him to have labs rechecked prior to that appointment.

## 2012-05-21 NOTE — Assessment & Plan Note (Signed)
Pt did not bring record of BG today. Reports BG elevated, most near 200. Reports compliance with medication.  Endocrine referral was set up, but he did not follow through because he would not record BG.  Strongly encouraged better compliance with diet and keeping better record of BG. Repeat A1c in 2 months.

## 2012-05-21 NOTE — Progress Notes (Signed)
Subjective:    Patient ID: Steve Phillips, male    DOB: 1946-07-30, 66 y.o.   MRN: [id number]  HPI 66YO male with h/o CAD, DM, HTN, HL presents for follow up. In regards to DM, he has not recorded any BG. Notes typical BG near 200. Last A1c 11%.  Reports compliance with medication.  At last visit, complained of fatigue. Noted to have iron def on labs. Stool hemocult was pos. Sent to GI for endoscopy. Had upper endoscopy, but he is unsure of results.  No colonoscopy because unable to stop Plavix for procedure. Reports fatigue has improved somewhat with B12 supplementation.  Still having issues with falls and gait disturbance. Has follow up with neurology scheduled.  Outpatient Encounter Prescriptions as of 05/21/2012  Medication Sig Dispense Refill  . amLODipine (NORVASC) 10 MG tablet Take 1 tablet (10 mg total) by mouth daily.  90 tablet  1  . aspirin 81 MG tablet Take 243 mg by mouth daily.       . carvedilol (COREG) 25 MG tablet Take 1 tablet (25 mg total) by mouth 2 (two) times daily.  60 tablet  4  . clopidogrel (PLAVIX) 75 MG tablet Take 1 tablet (75 mg total) by mouth daily.  90 tablet  2  . DULoxetine (CYMBALTA) 60 MG capsule Take 1 capsule (60 mg total) by mouth daily.  90 capsule  4  . Eszopiclone (ESZOPICLONE) 3 MG TABS Take 1 tablet (3 mg total) by mouth at bedtime. Take immediately before bedtime  30 tablet  0  . glucose blood (FREESTYLE TEST STRIPS) test strip Use to check blood sugar up to three times daily  100 each  3  . hydrALAZINE (APRESOLINE) 100 MG tablet Take 50-100 mg by mouth 3 (three) times daily. As needed for Blood pressure 160 over 100 or greater      . HYDROcodone-acetaminophen (VICODIN) 5-500 MG per tablet Take 2 tablets by mouth every 8 (eight) hours as needed for pain.  90 tablet  2  . insulin aspart (NOVOLOG FLEXPEN) 100 UNIT/ML injection Inject 25 units before meals per sliding scale. +  3 vial  3  . insulin glargine (LANTUS) 100 UNIT/ML injection Inject 80 Units  into the skin 2 (two) times daily.      . isosorbide mononitrate (IMDUR) 30 MG 24 hr tablet Take 30 mg by mouth daily.        Marland Kitchen lisinopril-hydrochlorothiazide (PRINZIDE,ZESTORETIC) 20-12.5 MG per tablet Take 1 tablet by mouth 2 (two) times daily.  180 tablet  4  . metFORMIN (GLUMETZA) 1000 MG (MOD) 24 hr tablet Take 1 tablet (1,000 mg total) by mouth 2 (two) times daily.  180 tablet  4  . NITROGLYCERIN PO Take by mouth. Sublingual....specific dose is unknown       . pantoprazole (PROTONIX) 40 MG tablet Take 40 mg by mouth daily.        . promethazine (PHENERGAN) 25 MG tablet Take 25 mg by mouth every 6 (six) hours as needed.      . rosuvastatin (CRESTOR) 5 MG tablet Take 1 tablet (5 mg total) by mouth daily.  90 tablet  3  . tiZANidine (ZANAFLEX) 4 MG tablet Take 1 tablet by mouth Three times a day.      Marland Kitchen DISCONTD: glucose blood (FREESTYLE TEST STRIPS) test strip Use to check blood sugar up to three times daily      . DISCONTD: glucose blood (ACCU-CHEK SMARTVIEW) test strip 1 each by Other route 3 (three)  times daily as needed for other. DX: 250.00 Insulin dependent  100 each  12   Facility-Administered Encounter Medications as of 05/21/2012  Medication Dose Route Frequency Provider Last Rate Last Dose  . cyanocobalamin ((VITAMIN B-12)) injection 1,000 mcg  1,000 mcg Intramuscular Once Shelia Media, MD   1,000 mcg at 05/21/12 1037   BP 162/92  Pulse 76  Temp 98.5 F (36.9 C) (Oral)  Ht 6' (1.829 m)  SpO2 98%  Review of Systems  Constitutional: Negative for fever, chills, activity change, appetite change, fatigue and unexpected weight change.  Eyes: Negative for visual disturbance.  Respiratory: Negative for cough and shortness of breath.   Cardiovascular: Negative for chest pain, palpitations and leg swelling.  Gastrointestinal: Negative for abdominal pain and abdominal distention.  Genitourinary: Negative for dysuria, urgency and difficulty urinating.  Musculoskeletal: Positive for  gait problem. Negative for arthralgias.  Skin: Negative for color change and rash.  Neurological: Positive for weakness and numbness.  Hematological: Negative for adenopathy.  Psychiatric/Behavioral: Negative for disturbed wake/sleep cycle and dysphoric mood. The patient is not nervous/anxious.        Objective:   Physical Exam  Constitutional: He is oriented to person, place, and time. He appears well-developed and well-nourished. No distress.  HENT:  Head: Normocephalic and atraumatic.  Right Ear: External ear normal.  Left Ear: External ear normal.  Nose: Nose normal.  Mouth/Throat: Oropharynx is clear and moist. No oropharyngeal exudate.  Eyes: Conjunctivae and EOM are normal. Pupils are equal, round, and reactive to light. Right eye exhibits no discharge. Left eye exhibits no discharge. No scleral icterus.  Neck: Normal range of motion. Neck supple. No tracheal deviation present. No thyromegaly present.  Cardiovascular: Normal rate, regular rhythm and normal heart sounds.  Exam reveals no gallop and no friction rub.   No murmur heard. Pulmonary/Chest: Effort normal and breath sounds normal. No respiratory distress. He has no wheezes. He has no rales. He exhibits no tenderness.  Musculoskeletal: Normal range of motion. He exhibits no edema.  Lymphadenopathy:    He has no cervical adenopathy.  Neurological: He is alert and oriented to person, place, and time. No cranial nerve deficit. Coordination normal.  Skin: Skin is warm and dry. No rash noted. He is not diaphoretic. No erythema. No pallor.     Psychiatric: He has a normal mood and affect. His behavior is normal. Judgment and thought content normal.          Assessment & Plan:

## 2012-05-21 NOTE — Telephone Encounter (Signed)
Patient is asking when you will want to see him again. Please advise.

## 2012-05-21 NOTE — Assessment & Plan Note (Signed)
Patient has several reasons for unstable gait including diabetic neuropathy. Neurology had recommended EMG testing and physical therapy. Patient would like to hold off on this for now until acute issues with diabetes have resolved.

## 2012-05-21 NOTE — Assessment & Plan Note (Signed)
GI eval in process. Upper endoscopy results pending. Will get records on this.

## 2012-05-21 NOTE — Assessment & Plan Note (Signed)
Likely multifactorial. Symptomatically improved somewhat with B12 supplementation. Also iron def and workup in process.

## 2012-05-26 NOTE — Telephone Encounter (Signed)
Tried calling patient, but got not answer and no way to leave a message

## 2012-05-28 DIAGNOSIS — R195 Other fecal abnormalities: Secondary | ICD-10-CM | POA: Diagnosis not present

## 2012-05-28 DIAGNOSIS — K219 Gastro-esophageal reflux disease without esophagitis: Secondary | ICD-10-CM | POA: Diagnosis not present

## 2012-05-28 NOTE — Telephone Encounter (Signed)
Left  Message notifying patient to keep his appt for august, but to call and schedule lab appt prior to that appt.

## 2012-06-09 ENCOUNTER — Other Ambulatory Visit: Payer: Self-pay | Admitting: *Deleted

## 2012-06-09 MED ORDER — PANTOPRAZOLE SODIUM 40 MG PO TBEC: 40.0000 mg | DELAYED_RELEASE_TABLET | Freq: Two times a day (BID) | ORAL | Status: DC

## 2012-06-10 DIAGNOSIS — M25519 Pain in unspecified shoulder: Secondary | ICD-10-CM | POA: Diagnosis not present

## 2012-06-10 DIAGNOSIS — M19019 Primary osteoarthritis, unspecified shoulder: Secondary | ICD-10-CM | POA: Insufficient documentation

## 2012-06-12 ENCOUNTER — Encounter: Payer: Self-pay | Admitting: Cardiovascular Disease

## 2012-06-12 ENCOUNTER — Encounter (INDEPENDENT_AMBULATORY_CARE_PROVIDER_SITE_OTHER): Payer: Medicare Other

## 2012-06-12 VITALS — BP 220/110

## 2012-06-12 DIAGNOSIS — I739 Peripheral vascular disease, unspecified: Secondary | ICD-10-CM

## 2012-06-12 DIAGNOSIS — L98499 Non-pressure chronic ulcer of skin of other sites with unspecified severity: Secondary | ICD-10-CM | POA: Diagnosis not present

## 2012-06-12 NOTE — Progress Notes (Unsigned)
Error

## 2012-06-12 NOTE — Patient Instructions (Signed)
Take amlodipine when you get home, as prescribed Keep appt with Dr. Kirke Corin scheduled for 06/16/12 Monitor BP at home Call us before Monday's OV if needed

## 2012-06-12 NOTE — Progress Notes (Deleted)
Pt here for PV test BP during test was 220/110 Tech asks that I repeat BP post test  Bp upon recheck was 160/70 in R arm and 170/70 in Left arm Pt remains asymptomatic and has appt with Dr. Kirke Corin Monday 06/16/12  He has not taken amlodipine yet I advised him to go ahead and take this when he gets home and I will also inform his PCP, Dr. Dan Humphreys, as well as Dr. Kirke Corin He is to call our office prior to Monday ov if BP not improved. He will monitor this at home.

## 2012-06-12 NOTE — Progress Notes (Unsigned)
Patient ID: Steve Phillips, male   DOB: May 23, 1946, 66 y.o.   MRN: [id number] Pt here for PV test BP during test was 220/110 Tech asks that I repeat BP post test  Bp upon recheck was 160/70 in R arm and 170/70 in Left arm Pt remains asymptomatic and has appt with Dr. Kirke Corin Monday 06/16/12  He has not taken amlodipine yet I advised him to go ahead and take this when he gets home and I will also inform his PCP, Dr. Dan Humphreys, as well as Dr. Kirke Corin He is to call our office prior to Monday ov if BP not improved. He will monitor this at home.

## 2012-06-16 ENCOUNTER — Ambulatory Visit (INDEPENDENT_AMBULATORY_CARE_PROVIDER_SITE_OTHER): Payer: Medicare Other | Admitting: Cardiovascular Disease

## 2012-06-16 ENCOUNTER — Encounter: Payer: Self-pay | Admitting: Cardiovascular Disease

## 2012-06-16 VITALS — BP 150/80 | HR 74 | Ht 72.0 in | Wt 253.2 lb

## 2012-06-16 DIAGNOSIS — L97509 Non-pressure chronic ulcer of other part of unspecified foot with unspecified severity: Secondary | ICD-10-CM

## 2012-06-16 DIAGNOSIS — E1169 Type 2 diabetes mellitus with other specified complication: Secondary | ICD-10-CM | POA: Diagnosis not present

## 2012-06-16 DIAGNOSIS — L98499 Non-pressure chronic ulcer of skin of other sites with unspecified severity: Secondary | ICD-10-CM | POA: Diagnosis not present

## 2012-06-16 DIAGNOSIS — I10 Essential (primary) hypertension: Secondary | ICD-10-CM

## 2012-06-16 DIAGNOSIS — I2510 Atherosclerotic heart disease of native coronary artery without angina pectoris: Secondary | ICD-10-CM | POA: Diagnosis not present

## 2012-06-16 MED ORDER — SPIRONOLACTONE 25 MG PO TABS: 25.0000 mg | ORAL_TABLET | Freq: Every day | ORAL | Status: DC

## 2012-06-16 NOTE — Progress Notes (Signed)
HPI  This is a 66 year old male who is here today for followup visit. He was seen in April at [facility] where he presented with unstable angina. He has known history of coronary artery disease status post multiple PCI in the past. He was being followed at [facility]. He ruled out for myocardial infarction by cardiac enzymes. He underwent cardiac catheterization which showed patent stents. However, there was a 90% stenosis in the proximal first diagonal. There was 40% disease in the mid LAD and moderate disease in the left circumflex. He underwent an angioplasty and drug-eluting stent placement to the diagonal without complications. He has been chest pain-free since then. He had a nonhealing ulcer on the left foot at that time. He had surgery done with complete resolution since then. He denies claudication. He had an ABI done which was normal. During his ABI study, he was noted to be very hypertensive with systolic blood pressure above 253. The patient had known history of refractory hypertension which seems to be worsening. He takes his medications regularly. He was found recently to have iron deficiency without significant anemia. There was positive  occult blood in the stool.  Allergies  Allergen Reactions  . Codeine     Derivatives Nausea.  . Trazodone And Nefazodone      Current Outpatient Prescriptions on File Prior to Visit  Medication Sig Dispense Refill  . amLODipine (NORVASC) 10 MG tablet Take 1 tablet (10 mg total) by mouth daily.  90 tablet  1  . aspirin 81 MG tablet Take 243 mg by mouth daily.       . carvedilol (COREG) 25 MG tablet Take 1 tablet (25 mg total) by mouth 2 (two) times daily.  60 tablet  4  . clopidogrel (PLAVIX) 75 MG tablet Take 1 tablet (75 mg total) by mouth daily.  90 tablet  2  . DULoxetine (CYMBALTA) 60 MG capsule Take 1 capsule (60 mg total) by mouth daily.  90 capsule  4  . glucose blood (FREESTYLE TEST STRIPS) test strip Use to check blood sugar up to  three times daily  100 each  3  . HYDROcodone-acetaminophen (VICODIN) 5-500 MG per tablet Take 2 tablets by mouth every 8 (eight) hours as needed for pain.  90 tablet  2  . insulin aspart (NOVOLOG FLEXPEN) 100 UNIT/ML injection Inject 25 units before meals per sliding scale. +  3 vial  3  . insulin glargine (LANTUS) 100 UNIT/ML injection Inject 80 Units into the skin 2 (two) times daily.      . isosorbide mononitrate (IMDUR) 30 MG 24 hr tablet Take 30 mg by mouth daily.        Marland Kitchen lisinopril-hydrochlorothiazide (PRINZIDE,ZESTORETIC) 20-12.5 MG per tablet Take 1 tablet by mouth 2 (two) times daily.  180 tablet  4  . metFORMIN (GLUMETZA) 1000 MG (MOD) 24 hr tablet Take 1 tablet (1,000 mg total) by mouth 2 (two) times daily.  180 tablet  4  . NITROGLYCERIN PO Take by mouth. Sublingual....specific dose is unknown       . pantoprazole (PROTONIX) 40 MG tablet Take 1 tablet (40 mg total) by mouth 2 (two) times daily.  180 tablet  3  . tiZANidine (ZANAFLEX) 4 MG tablet Take 1 tablet by mouth 3 (three) times daily as needed.       Marland Kitchen spironolactone (ALDACTONE) 25 MG tablet Take 1 tablet (25 mg total) by mouth daily.  21 tablet  1   Current Facility-Administered Medications on File  Prior to Visit  Medication Dose Route Frequency Provider Last Rate Last Dose  . promethazine (PHENERGAN) injection 25 mg  25 mg Intramuscular Once Shelia Media, MD         Past Medical History  Diagnosis Date  . Shingles   . Diabetes mellitus   . Hyperlipidemia   . Hypertension   . Broken leg     left...s/p pins  . Lower leg pain     chronic ,left leg  . Acute angina   . Depression   . 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 Kirke Corin     Past Surgical History  Procedure Date  . Appendectomy   . Cholecystectomy   .  Left leg surgery   . Left foot surgery   . Drainage port in left testicle   . Cardiac catheterization 02/2012     History reviewed. No pertinent family history.   History   Social History  . Marital Status: Married    Spouse Name: N/A    Number of Children: N/A  . Years of Education: N/A   Occupational History  . Not on file.   Social History Main Topics  . Smoking status: Former Smoker    Quit date: 11/08/1971  . Smokeless tobacco: Never Used  . Alcohol Use: No  . Drug Use: No  . Sexually Active: Not on file   Other Topics Concern  . Not on file   Social History Narrative  . No narrative on file    PHYSICAL EXAM   BP 150/80  Pulse 74  Ht 6' (1.829 m)  Wt 253 lb 4 oz (114.873 kg)  BMI 34.35 kg/m2 Constitutional: He is oriented to person, place, and time. He appears well-developed and well-nourished. No distress.  HENT: No nasal discharge.  Head: Normocephalic and atraumatic.  Eyes: Pupils are equal and round. Right eye exhibits no discharge. Left eye exhibits no discharge.  Neck: Normal range of motion. Neck supple. No JVD present. No thyromegaly present.  Cardiovascular: Normal rate, regular rhythm, normal heart sounds and. Exam reveals no gallop and no friction rub. There is a 1/6 systolic ejection murmur at the base Pulmonary/Chest: Effort normal and breath sounds normal. No stridor. No respiratory distress. He has no wheezes. He has no rales. He exhibits no tenderness.  Abdominal: Soft. Bowel sounds are normal. He exhibits no distension. There is no tenderness. There is no rebound and no guarding.  Musculoskeletal: Normal range of motion. He exhibits no edema and no tenderness.  Neurological: He is alert and oriented to person, place, and time. Coordination normal.  Skin: Skin is warm and dry. No rash noted. He is not diaphoretic. No erythema. No pallor.  Psychiatric: He has a normal mood and affect. His behavior is normal. Judgment and thought content  normal.        ASSESSMENT AND PLAN

## 2012-06-16 NOTE — Patient Instructions (Addendum)
Start Spironolactone 25 mg once daily.  Labs in 1 week.  Your physician has requested that you have a renal artery duplex. During this test, an ultrasound is used to evaluate blood flow to the kidneys. Allow one hour for this exam. Do not eat after midnight the day before and avoid carbonated beverages. Take your medications as you usually do. Follow up in 1 month

## 2012-06-16 NOTE — Assessment & Plan Note (Signed)
ABI showed no evidence of peripheral arterial disease. This has healed with recent surgery.

## 2012-06-16 NOTE — Assessment & Plan Note (Addendum)
He does have residual mild to moderate coronary artery disease. His risk factors need to be treated aggressively. Continue dual antiplatelet therapy with aspirin and Plavix. He should stay on this combination until April of 2014. If stopping Plavix is deemed to be absolutely needed in order to undergo EGD/colonoscopy, then the minimal recommended duration would be 6 months before it can be interrupted for 5-7 days. That would be in October of this year. He will have to stay on aspirin 81 mg daily which cannot be stopped.

## 2012-06-16 NOTE — Assessment & Plan Note (Signed)
The patient seems to have refractory hypertension on multiple blood pressure medications. I think we need to rule out renal artery stenosis as the culprit. Thus, I recommend renal artery duplex ultrasound. I will add spironolactone 25 mg once daily. Check basic metabolic profile in one week and then one month. I will have him followup with me in one month anyway to see how his blood pressure control. If he does not have evidence of renal artery stenosis, he might be a candidate in the future for renal denervation procedure which is still not approved yet.

## 2012-06-30 ENCOUNTER — Ambulatory Visit (INDEPENDENT_AMBULATORY_CARE_PROVIDER_SITE_OTHER): Payer: Medicare Other | Admitting: Internal Medicine

## 2012-06-30 ENCOUNTER — Encounter: Payer: Self-pay | Admitting: Internal Medicine

## 2012-06-30 VITALS — BP 180/90 | HR 84 | Temp 98.4°F | Ht 72.0 in | Wt 250.2 lb

## 2012-06-30 DIAGNOSIS — D510 Vitamin B12 deficiency anemia due to intrinsic factor deficiency: Secondary | ICD-10-CM | POA: Diagnosis not present

## 2012-06-30 DIAGNOSIS — E1149 Type 2 diabetes mellitus with other diabetic neurological complication: Secondary | ICD-10-CM

## 2012-06-30 DIAGNOSIS — I10 Essential (primary) hypertension: Secondary | ICD-10-CM | POA: Diagnosis not present

## 2012-06-30 DIAGNOSIS — E1165 Type 2 diabetes mellitus with hyperglycemia: Secondary | ICD-10-CM

## 2012-06-30 DIAGNOSIS — F329 Major depressive disorder, single episode, unspecified: Secondary | ICD-10-CM

## 2012-06-30 DIAGNOSIS — E1142 Type 2 diabetes mellitus with diabetic polyneuropathy: Secondary | ICD-10-CM

## 2012-06-30 MED ORDER — AMLODIPINE BESYLATE 10 MG PO TABS: 10.0000 mg | ORAL_TABLET | Freq: Two times a day (BID) | ORAL | Status: DC

## 2012-06-30 MED ORDER — CYANOCOBALAMIN 1000 MCG/ML IJ SOLN
1000.0000 ug | Freq: Once | INTRAMUSCULAR | Status: AC
  Administered 2012-06-30: 1000 ug via INTRAMUSCULAR

## 2012-06-30 MED ORDER — AMLODIPINE BESYLATE 10 MG PO TABS: 10.0000 mg | ORAL_TABLET | Freq: Every day | ORAL | Status: DC

## 2012-06-30 MED ORDER — CYANOCOBALAMIN 1000 MCG/ML IJ SOLN: 1000.0000 ug | INTRAMUSCULAR | Status: AC

## 2012-06-30 MED ORDER — GLUCOSE BLOOD VI STRP: ORAL_STRIP | Status: DC

## 2012-06-30 MED ORDER — DULOXETINE HCL 30 MG PO CPEP: 30.0000 mg | ORAL_CAPSULE | Freq: Every day | ORAL | Status: DC

## 2012-06-30 NOTE — Assessment & Plan Note (Addendum)
Patient like to taper off Cymbalta. He is concerned about potential bleeding risk with this medication. Tried to offer reassurance today that this risk is minimal. Will try tapering to 30 mg of Cymbalta daily for one month and see how he tolerates this.

## 2012-06-30 NOTE — Assessment & Plan Note (Signed)
Blood pressure markedly elevated today, however patient reports not taking his medication. Encouraged better compliance with his medication. There appears to have been some confusion about his amlodipine which he has been taking twice daily at home. Will change back to once daily. He has followup renal artery duplex ultrasound next week. He will followup here in one month.

## 2012-06-30 NOTE — Progress Notes (Signed)
Subjective:    Patient ID: Steve Phillips, male    DOB: 1946/05/27, 66 y.o.   MRN: [id number]  HPI 66 year old male with history of diabetes, hypertension, CAD presents for followup. He reports he is generally been doing well. He reports he did not take his blood pressure medication today. He denies any chest pain, headache, shortness of breath. He brings record of his blood sugars today which show most blood sugars near 150. He does have a few low blood sugars and also some blood sugars greater than 250. He reports full compliance with his medications.  He is interested in stopping his Cymbalta because of potential risk for increased bleeding. He has been on this for years to help with both depression and leg pain. He would like to taper off this medication.  Outpatient Encounter Prescriptions as of 06/30/2012  Medication Sig Dispense Refill  . amLODipine (NORVASC) 10 MG tablet Take 1 tablet (10 mg total) by mouth daily.  180 tablet  3  . aspirin 81 MG tablet Take 243 mg by mouth daily.       . carvedilol (COREG) 25 MG tablet Take 1 tablet (25 mg total) by mouth 2 (two) times daily.  60 tablet  4  . clopidogrel (PLAVIX) 75 MG tablet Take 1 tablet (75 mg total) by mouth daily.  90 tablet  2  . DULoxetine (CYMBALTA) 30 MG capsule Take 1 capsule (30 mg total) by mouth daily.  21 capsule  4  . glucose blood (FREESTYLE TEST STRIPS) test strip Use to check blood sugar up to three times daily  100 each  11  . HYDROcodone-acetaminophen (VICODIN) 5-500 MG per tablet Take 2 tablets by mouth every 8 (eight) hours as needed for pain.  90 tablet  2  . insulin aspart (NOVOLOG FLEXPEN) 100 UNIT/ML injection Inject 25 units before meals per sliding scale. +  3 vial  3  . insulin glargine (LANTUS) 100 UNIT/ML injection Inject 80 Units into the skin 2 (two) times daily.      . isosorbide mononitrate (IMDUR) 30 MG 24 hr tablet Take 30 mg by mouth daily.        Marland Kitchen lisinopril-hydrochlorothiazide (PRINZIDE,ZESTORETIC)  20-12.5 MG per tablet Take 1 tablet by mouth 2 (two) times daily.  180 tablet  4  . metFORMIN (GLUMETZA) 1000 MG (MOD) 24 hr tablet Take 1 tablet (1,000 mg total) by mouth 2 (two) times daily.  180 tablet  4  . NITROGLYCERIN PO Take by mouth. Sublingual....specific dose is unknown       . pantoprazole (PROTONIX) 40 MG tablet Take 1 tablet (40 mg total) by mouth 2 (two) times daily.  180 tablet  3  . rosuvastatin (CRESTOR) 10 MG tablet Take 10 mg by mouth daily.      Marland Kitchen spironolactone (ALDACTONE) 25 MG tablet Take 1 tablet (25 mg total) by mouth daily.  21 tablet  1  . tiZANidine (ZANAFLEX) 4 MG tablet Take 1 tablet by mouth 3 (three) times daily as needed.       . cyanocobalamin (,VITAMIN B-12,) 1000 MCG/ML injection Inject 1 mL (1,000 mcg total) into the muscle every 30 (thirty) days.  30 mL  3   Facility-Administered Encounter Medications as of 06/30/2012  Medication Dose Route Frequency Provider Last Rate Last Dose  . cyanocobalamin ((VITAMIN B-12)) injection 1,000 mcg  1,000 mcg Intramuscular Once Shelia Media, MD   1,000 mcg at 06/30/12 1520   BP 180/90  Pulse 84  Temp 98.4  F (36.9 C) (Oral)  Ht 6' (1.829 m)  Wt 250 lb 4 oz (113.513 kg)  BMI 33.94 kg/m2  SpO2 97%  Review of Systems  Constitutional: Positive for fatigue. Negative for fever, chills, activity change, appetite change and unexpected weight change.  Eyes: Negative for visual disturbance.  Respiratory: Negative for cough and shortness of breath.   Cardiovascular: Negative for chest pain, palpitations and leg swelling.  Gastrointestinal: Negative for abdominal pain and abdominal distention.  Genitourinary: Negative for dysuria, urgency and difficulty urinating.  Musculoskeletal: Negative for arthralgias and gait problem.  Skin: Negative for color change and rash.  Neurological: Positive for weakness.  Hematological: Negative for adenopathy.  Psychiatric/Behavioral: Negative for disturbed wake/sleep cycle and  dysphoric mood. The patient is not nervous/anxious.        Objective:   Physical Exam  Constitutional: He is oriented to person, place, and time. He appears well-developed and well-nourished. No distress.  HENT:  Head: Normocephalic and atraumatic.  Right Ear: External ear normal.  Left Ear: External ear normal.  Nose: Nose normal.  Mouth/Throat: Oropharynx is clear and moist. No oropharyngeal exudate.  Eyes: Conjunctivae and EOM are normal. Pupils are equal, round, and reactive to light. Right eye exhibits no discharge. Left eye exhibits no discharge. No scleral icterus.  Neck: Normal range of motion. Neck supple. No tracheal deviation present. No thyromegaly present.  Cardiovascular: Normal rate, regular rhythm and normal heart sounds.  Exam reveals no gallop and no friction rub.   No murmur heard. Pulmonary/Chest: Effort normal and breath sounds normal. No respiratory distress. He has no wheezes. He has no rales. He exhibits no tenderness.  Musculoskeletal: Normal range of motion. He exhibits no edema.  Lymphadenopathy:    He has no cervical adenopathy.  Neurological: He is alert and oriented to person, place, and time. No cranial nerve deficit. Coordination normal.  Skin: Skin is warm and dry. No rash noted. He is not diaphoretic. No erythema. No pallor.  Psychiatric: He has a normal mood and affect. His behavior is normal. Judgment and thought content normal.          Assessment & Plan:

## 2012-06-30 NOTE — Assessment & Plan Note (Signed)
Patient's blood sugar record shows improved control of blood sugar. Will check A1c with labs today. We'll continue current medications. Followup one month.

## 2012-07-01 ENCOUNTER — Other Ambulatory Visit: Payer: Self-pay | Admitting: *Deleted

## 2012-07-01 LAB — COMPREHENSIVE METABOLIC PANEL
BUN: 12 mg/dL (ref 6–23)
CO2: 21 mEq/L (ref 19–32)
Calcium: 8.7 mg/dL (ref 8.4–10.5)
Chloride: 100 mEq/L (ref 96–112)
Creatinine, Ser: 0.8 mg/dL (ref 0.4–1.5)
GFR: 96.58 mL/min (ref >60.00)
Glucose, Bld: 293 mg/dL — ABNORMAL HIGH (ref 70–99)

## 2012-07-01 LAB — LIPID PANEL
Cholesterol: 111 mg/dL (ref 0–200)
Triglycerides: 282 mg/dL — ABNORMAL HIGH (ref 0.0–149.0)

## 2012-07-01 LAB — LDL CHOLESTEROL, DIRECT: Direct LDL: 46.6 mg/dL

## 2012-07-01 LAB — HEMOGLOBIN A1C: Hgb A1c MFr Bld: 8.5 % — ABNORMAL HIGH (ref 4.6–6.5)

## 2012-07-01 MED ORDER — CARVEDILOL 25 MG PO TABS: 25.0000 mg | ORAL_TABLET | Freq: Two times a day (BID) | ORAL | Status: DC

## 2012-07-08 ENCOUNTER — Encounter (INDEPENDENT_AMBULATORY_CARE_PROVIDER_SITE_OTHER): Payer: Medicare Other

## 2012-07-08 DIAGNOSIS — I10 Essential (primary) hypertension: Secondary | ICD-10-CM | POA: Diagnosis not present

## 2012-07-14 ENCOUNTER — Encounter: Payer: Self-pay | Admitting: Cardiovascular Disease

## 2012-07-14 ENCOUNTER — Ambulatory Visit (INDEPENDENT_AMBULATORY_CARE_PROVIDER_SITE_OTHER): Payer: Medicare Other | Admitting: Cardiovascular Disease

## 2012-07-14 VITALS — BP 128/68 | HR 72 | Ht 72.0 in | Wt 247.0 lb

## 2012-07-14 DIAGNOSIS — I10 Essential (primary) hypertension: Secondary | ICD-10-CM

## 2012-07-14 DIAGNOSIS — I2510 Atherosclerotic heart disease of native coronary artery without angina pectoris: Secondary | ICD-10-CM

## 2012-07-14 NOTE — Patient Instructions (Addendum)
Continue same medications.  Follow up in 6 months.  

## 2012-07-16 ENCOUNTER — Telehealth: Payer: Self-pay | Admitting: Internal Medicine

## 2012-07-16 NOTE — Telephone Encounter (Signed)
Steve Phillips called about Steve Phillips cymbalta 30 mg he needs to get this changed back to 60mg  90supply [facility]

## 2012-07-16 NOTE — Telephone Encounter (Signed)
Fine to fill. 

## 2012-07-17 MED ORDER — DULOXETINE HCL 60 MG PO CPEP: 60.0000 mg | ORAL_CAPSULE | Freq: Every day | ORAL | Status: DC

## 2012-07-17 NOTE — Telephone Encounter (Signed)
Patient's spouse advised as instructed via telephone, Rx sent to [facility].

## 2012-07-19 ENCOUNTER — Encounter: Payer: Self-pay | Admitting: Cardiovascular Disease

## 2012-07-19 NOTE — Assessment & Plan Note (Signed)
His blood pressure is now well controlled after the addition of spironolactone. His labs showed normal renal function and potassium level. There was no evidence of renal artery stenosis on ultrasound. He might be a candidate in the future for renal denervation procedure which is still not approved yet.

## 2012-07-19 NOTE — Progress Notes (Signed)
HPI  This is a 66 year old male who is here today for followup visit. He was seen in April at [facility] where he presented with unstable angina. He has known history of coronary artery disease status post multiple PCI in the past. He was being followed at [facility]. He ruled out for myocardial infarction by cardiac enzymes. He underwent cardiac catheterization which showed patent stents. However, there was a 90% stenosis in the proximal first diagonal. There was 40% disease in the mid LAD and moderate disease in the left circumflex. He underwent an angioplasty and drug-eluting stent placement to the diagonal without complications. He has been chest pain-free since then. He had a nonhealing ulcer on the left foot at that time. He had surgery done with complete resolution since then. He had an ABI done which was normal. During his ABI study. The patient had known history of refractory hypertension. He had renal artery duplex ultrasound which showed no evidence of renal artery stenosis. Overall, he is doing reasonably well.  Allergies  Allergen Reactions  . Codeine     Derivatives Nausea.  . Trazodone And Nefazodone      Current Outpatient Prescriptions on File Prior to Visit  Medication Sig Dispense Refill  . amLODipine (NORVASC) 10 MG tablet Take 1 tablet (10 mg total) by mouth daily.  180 tablet  3  . aspirin 81 MG tablet Take 243 mg by mouth daily.       . carvedilol (COREG) 25 MG tablet Take 1 tablet (25 mg total) by mouth 2 (two) times daily.  60 tablet  11  . clopidogrel (PLAVIX) 75 MG tablet Take 1 tablet (75 mg total) by mouth daily.  90 tablet  2  . cyanocobalamin (,VITAMIN B-12,) 1000 MCG/ML injection Inject 1 mL (1,000 mcg total) into the muscle every 30 (thirty) days.  30 mL  3  . glucose blood (FREESTYLE TEST STRIPS) test strip Use to check blood sugar up to three times daily  100 each  11  . HYDROcodone-acetaminophen (VICODIN) 5-500 MG per tablet Take 2 tablets by mouth every  8 (eight) hours as needed for pain.  90 tablet  2  . insulin aspart (NOVOLOG FLEXPEN) 100 UNIT/ML injection Inject 25 units before meals per sliding scale. +  3 vial  3  . insulin glargine (LANTUS) 100 UNIT/ML injection Inject 80 Units into the skin 2 (two) times daily.      . isosorbide mononitrate (IMDUR) 30 MG 24 hr tablet Take 30 mg by mouth daily.        Marland Kitchen lisinopril-hydrochlorothiazide (PRINZIDE,ZESTORETIC) 20-12.5 MG per tablet Take 1 tablet by mouth 2 (two) times daily.  180 tablet  4  . metFORMIN (GLUMETZA) 1000 MG (MOD) 24 hr tablet Take 1 tablet (1,000 mg total) by mouth 2 (two) times daily.  180 tablet  4  . NITROGLYCERIN PO Take by mouth. Sublingual....specific dose is unknown       . pantoprazole (PROTONIX) 40 MG tablet Take 1 tablet (40 mg total) by mouth 2 (two) times daily.  180 tablet  3  . rosuvastatin (CRESTOR) 10 MG tablet Take 10 mg by mouth daily.      Marland Kitchen spironolactone (ALDACTONE) 25 MG tablet Take 1 tablet (25 mg total) by mouth daily.  21 tablet  1  . tiZANidine (ZANAFLEX) 4 MG tablet Take 1 tablet by mouth 3 (three) times daily as needed.        Current Facility-Administered Medications on File Prior to Visit  Medication Dose Route Frequency Provider Last Rate Last Dose  . promethazine (PHENERGAN) injection 25 mg  25 mg Intramuscular Once Shelia Media, MD         Past Medical History  Diagnosis Date  . Shingles   . Diabetes mellitus   . Hyperlipidemia   . Hypertension   . Broken leg     left...s/p pins  . Lower leg pain     chronic ,left leg  . Acute angina   . Depression   . 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 Kirke Corin     Past Surgical History  Procedure Date  . Appendectomy   . Cholecystectomy   . Left leg surgery   . Left foot surgery   . Drainage  port in left testicle   . Cardiac catheterization 02/2012     History reviewed. No pertinent family history.   History   Social History  . Marital Status: Married    Spouse Name: N/A    Number of Children: N/A  . Years of Education: N/A   Occupational History  . Not on file.   Social History Main Topics  . Smoking status: Former Smoker    Quit date: 11/08/1971  . Smokeless tobacco: Never Used  . Alcohol Use: No  . Drug Use: No  . Sexually Active: Not on file   Other Topics Concern  . Not on file   Social History Narrative  . No narrative on file      PHYSICAL EXAM   BP 128/68  Pulse 72  Ht 6' (1.829 m)  Wt 247 lb (112.038 kg)  BMI 33.50 kg/m2 Constitutional: He is oriented to person, place, and time. He appears well-developed and well-nourished. No distress.  HENT: No nasal discharge.  Head: Normocephalic and atraumatic.  Eyes: Pupils are equal and round. Right eye exhibits no discharge. Left eye exhibits no discharge.  Neck: Normal range of motion. Neck supple. No JVD present. No thyromegaly present.  Cardiovascular: Normal rate, regular rhythm, normal heart sounds and. Exam reveals no gallop and no friction rub. No murmur heard.  Pulmonary/Chest: Effort normal and breath sounds normal. No stridor. No respiratory distress. He has no wheezes. He has no rales. He exhibits no tenderness.  Abdominal: Soft. Bowel sounds are normal. He exhibits no distension. There is no tenderness. There is no rebound and no guarding.  Musculoskeletal: Normal range of motion. He exhibits no edema and no tenderness.  Neurological: He is alert and oriented to person, place, and time. Coordination normal.  Skin: Skin is warm and dry. No rash noted. He is not diaphoretic. No erythema. No pallor.  Psychiatric: He has a normal mood and affect. His behavior is normal. Judgment and thought content normal.        ASSESSMENT AND PLAN

## 2012-07-19 NOTE — Assessment & Plan Note (Signed)
No symptoms suggestive of angina. Continue medical therapy. Continue dual antiplatelet therapy with aspirin and Plavix. He should stay on this combination until April of 2014. If stopping Plavix is deemed to be absolutely needed in order to undergo EGD/colonoscopy, then the minimal recommended duration would be 6 months before it can be interrupted for 5-7 days. That would be in October of this year. He will have to stay on aspirin 81 mg daily which cannot be stopped.

## 2012-07-29 ENCOUNTER — Other Ambulatory Visit: Payer: Self-pay | Admitting: Internal Medicine

## 2012-07-29 ENCOUNTER — Ambulatory Visit (INDEPENDENT_AMBULATORY_CARE_PROVIDER_SITE_OTHER): Payer: Medicare Other | Admitting: Internal Medicine

## 2012-07-29 ENCOUNTER — Encounter: Payer: Self-pay | Admitting: Internal Medicine

## 2012-07-29 VITALS — BP 160/100 | HR 74 | Temp 98.7°F | Ht 72.0 in | Wt 252.5 lb

## 2012-07-29 DIAGNOSIS — E1142 Type 2 diabetes mellitus with diabetic polyneuropathy: Secondary | ICD-10-CM | POA: Diagnosis not present

## 2012-07-29 DIAGNOSIS — I10 Essential (primary) hypertension: Secondary | ICD-10-CM

## 2012-07-29 DIAGNOSIS — D509 Iron deficiency anemia, unspecified: Secondary | ICD-10-CM | POA: Diagnosis not present

## 2012-07-29 DIAGNOSIS — R5381 Other malaise: Secondary | ICD-10-CM | POA: Diagnosis not present

## 2012-07-29 DIAGNOSIS — E1149 Type 2 diabetes mellitus with other diabetic neurological complication: Secondary | ICD-10-CM | POA: Diagnosis not present

## 2012-07-29 DIAGNOSIS — R5383 Other fatigue: Secondary | ICD-10-CM | POA: Diagnosis not present

## 2012-07-29 LAB — CBC WITH DIFFERENTIAL/PLATELET
Basophils Relative: 0.4 % (ref 0.0–3.0)
Eosinophils Absolute: 0.2 10*3/uL (ref 0.0–0.7)
Eosinophils Relative: 2.2 % (ref 0.0–5.0)
HCT: 43.4 % (ref 39.0–52.0)
Lymphs Abs: 1.7 10*3/uL (ref 0.7–4.0)
MCHC: 33.2 g/dL (ref 30.0–36.0)
MCV: 78.9 fl (ref 78.0–100.0)
Monocytes Absolute: 0.7 10*3/uL (ref 0.1–1.0)
Neutrophils Relative %: 75.5 % (ref 43.0–77.0)
Platelets: 226 10*3/uL (ref 150.0–400.0)
WBC: 11.1 10*3/uL — ABNORMAL HIGH (ref 4.5–10.5)

## 2012-07-29 LAB — FERRITIN: Ferritin: 27.6 ng/mL (ref 22.0–322.0)

## 2012-07-29 MED ORDER — CLOPIDOGREL BISULFATE 75 MG PO TABS: 75.0000 mg | ORAL_TABLET | Freq: Every day | ORAL | Status: DC

## 2012-07-29 NOTE — Assessment & Plan Note (Signed)
Iron deficiency noted on previous labs with Hemoccult-positive stool. GI referral made for colonoscopy however patient has significant risk of in-stent restenosis she stop his Plavix for one week for colonoscopy. Will repeat CBC and ferritin level today to reassess for ongoing blood loss. If significant drop, may be necessary to proceed with colonoscopy.

## 2012-07-29 NOTE — Progress Notes (Signed)
Subjective:    Patient ID: Steve Phillips, male    DOB: 1946-04-10, 66 y.o.   MRN: [id number]  HPI 66 year old male with history of diabetes, hypertension, hyperlipidemia, coronary artery disease, and iron deficiency anemia presents for followup. He reports ongoing fatigue. He denies any focal symptoms such as shortness of breath, chest pain, palpitations, change in bowel habits. He denies blood in his stool or black stool. In regards to diabetes, he reports that blood sugars have been variable. He did not bring a record of blood sugars today. He reports missing medication including insulin on occasion. Recent hemoglobin A1c was 8.5% which was an improvement from previous of 11%. He denies any recent falls. Next  In regards to hypertension, he reports that he has not yet taken his medication today. He reports that blood pressures have been variable. He did not bring a record of blood pressures. He reports occasionally missing his medication. He denies chest pain, palpitations, headache.  Outpatient Encounter Prescriptions as of 07/29/2012  Medication Sig Dispense Refill  . amLODipine (NORVASC) 10 MG tablet Take 1 tablet (10 mg total) by mouth daily.  180 tablet  3  . aspirin 81 MG tablet Take 243 mg by mouth daily.       . carvedilol (COREG) 25 MG tablet Take 1 tablet (25 mg total) by mouth 2 (two) times daily.  60 tablet  11  . clopidogrel (PLAVIX) 75 MG tablet Take 1 tablet (75 mg total) by mouth daily.  90 tablet  3  . cyanocobalamin (,VITAMIN B-12,) 1000 MCG/ML injection Inject 1 mL (1,000 mcg total) into the muscle every 30 (thirty) days.  30 mL  3  . DULoxetine (CYMBALTA) 60 MG capsule Take 1 capsule (60 mg total) by mouth daily.  90 capsule  3  . glucose blood (FREESTYLE TEST STRIPS) test strip Use to check blood sugar up to three times daily  100 each  11  . HYDROcodone-acetaminophen (VICODIN) 5-500 MG per tablet Take 2 tablets by mouth every 8 (eight) hours as needed for pain.  90 tablet  2    . insulin aspart (NOVOLOG FLEXPEN) 100 UNIT/ML injection Inject 25 units before meals per sliding scale. +  3 vial  3  . isosorbide mononitrate (IMDUR) 30 MG 24 hr tablet Take 30 mg by mouth daily.        Marland Kitchen LANTUS 100 UNIT/ML injection INJECT 80 UNITS            SUBCUTANEOUS TWICE DAILY  150 mL  3  . lisinopril-hydrochlorothiazide (PRINZIDE,ZESTORETIC) 20-12.5 MG per tablet Take 1 tablet by mouth 2 (two) times daily.  180 tablet  4  . metFORMIN (GLUMETZA) 1000 MG (MOD) 24 hr tablet Take 1 tablet (1,000 mg total) by mouth 2 (two) times daily.  180 tablet  4  . NITROGLYCERIN PO Take by mouth. Sublingual....specific dose is unknown       . pantoprazole (PROTONIX) 40 MG tablet Take 1 tablet (40 mg total) by mouth 2 (two) times daily.  180 tablet  3  . rosuvastatin (CRESTOR) 10 MG tablet Take 10 mg by mouth daily.      Marland Kitchen spironolactone (ALDACTONE) 25 MG tablet Take 1 tablet (25 mg total) by mouth daily.  21 tablet  1  . tiZANidine (ZANAFLEX) 4 MG tablet Take 1 tablet by mouth 3 (three) times daily as needed.         BP 160/100  Pulse 74  Temp 98.7 F (37.1 C) (Oral)  Ht 6' (  1.829 m)  Wt 252 lb 8 oz (114.533 kg)  BMI 34.25 kg/m2  SpO2 96%  Review of Systems  Constitutional: Positive for fatigue. Negative for fever, chills, activity change, appetite change and unexpected weight change.  Eyes: Negative for visual disturbance.  Respiratory: Negative for cough and shortness of breath.   Cardiovascular: Negative for chest pain, palpitations and leg swelling.  Gastrointestinal: Negative for abdominal pain and abdominal distention.  Genitourinary: Negative for dysuria, urgency and difficulty urinating.  Musculoskeletal: Negative for arthralgias and gait problem.  Skin: Negative for color change and rash.  Hematological: Negative for adenopathy.  Psychiatric/Behavioral: Negative for disturbed wake/sleep cycle and dysphoric mood. The patient is not nervous/anxious.        Objective:   Physical  Exam  Constitutional: He is oriented to person, place, and time. He appears well-developed and well-nourished. No distress.  HENT:  Head: Normocephalic and atraumatic.  Right Ear: External ear normal.  Left Ear: External ear normal.  Nose: Nose normal.  Mouth/Throat: Oropharynx is clear and moist. No oropharyngeal exudate.  Eyes: Conjunctivae and EOM are normal. Pupils are equal, round, and reactive to light. Right eye exhibits no discharge. Left eye exhibits no discharge. No scleral icterus.  Neck: Normal range of motion. Neck supple. No tracheal deviation present. No thyromegaly present.  Cardiovascular: Normal rate, regular rhythm and normal heart sounds.  Exam reveals no gallop and no friction rub.   No murmur heard. Pulmonary/Chest: Effort normal and breath sounds normal. No respiratory distress. He has no wheezes. He has no rales. He exhibits no tenderness.  Musculoskeletal: Normal range of motion. He exhibits no edema.  Lymphadenopathy:    He has no cervical adenopathy.  Neurological: He is alert and oriented to person, place, and time. No cranial nerve deficit. Coordination normal.  Skin: Skin is warm and dry. No rash noted. He is not diaphoretic. No erythema. No pallor.  Psychiatric: He has a normal mood and affect. His behavior is normal. Judgment and thought content normal.          Assessment & Plan:

## 2012-07-29 NOTE — Assessment & Plan Note (Signed)
Recent blood sugars were much better controlled with A1c of 8.5% down from 11%. However, patient reports only intermittent compliance with medication. Encouraged better compliance with medication. Will continue Lantus and NovoLog. Followup one month.

## 2012-07-29 NOTE — Assessment & Plan Note (Signed)
Blood pressure elevated today however patient did not take medication this morning. Encouraged better compliance with medication. Followup one month.

## 2012-07-29 NOTE — Assessment & Plan Note (Signed)
Likely multifactorial. Patient has iron deficiency anemia, B12 deficiency, diabetes which is poorly controlled, coronary artery disease, obesity. Encouraged better compliance with medications. Will repeat CBC and ferritin level with labs today. Followup one month.

## 2012-08-28 ENCOUNTER — Ambulatory Visit (INDEPENDENT_AMBULATORY_CARE_PROVIDER_SITE_OTHER): Payer: Medicare Other | Admitting: Internal Medicine

## 2012-08-28 ENCOUNTER — Encounter: Payer: Self-pay | Admitting: Internal Medicine

## 2012-08-28 VITALS — BP 140/70 | HR 68 | Temp 97.9°F | Ht 72.0 in | Wt 257.0 lb

## 2012-08-28 DIAGNOSIS — R5383 Other fatigue: Secondary | ICD-10-CM

## 2012-08-28 DIAGNOSIS — E538 Deficiency of other specified B group vitamins: Secondary | ICD-10-CM | POA: Diagnosis not present

## 2012-08-28 DIAGNOSIS — Z23 Encounter for immunization: Secondary | ICD-10-CM

## 2012-08-28 DIAGNOSIS — E1142 Type 2 diabetes mellitus with diabetic polyneuropathy: Secondary | ICD-10-CM

## 2012-08-28 DIAGNOSIS — R5381 Other malaise: Secondary | ICD-10-CM

## 2012-08-28 DIAGNOSIS — R51 Headache: Secondary | ICD-10-CM

## 2012-08-28 DIAGNOSIS — I10 Essential (primary) hypertension: Secondary | ICD-10-CM | POA: Diagnosis not present

## 2012-08-28 DIAGNOSIS — E1149 Type 2 diabetes mellitus with other diabetic neurological complication: Secondary | ICD-10-CM

## 2012-08-28 MED ORDER — HYDROCODONE-ACETAMINOPHEN 5-500 MG PO TABS: 2.0000 | ORAL_TABLET | Freq: Three times a day (TID) | ORAL | Status: DC | PRN

## 2012-08-28 MED ORDER — TIZANIDINE HCL 4 MG PO TABS: 4.0000 mg | ORAL_TABLET | Freq: Three times a day (TID) | ORAL | Status: DC | PRN

## 2012-08-28 MED ORDER — CYANOCOBALAMIN 1000 MCG/ML IJ SOLN
1000.0000 ug | Freq: Once | INTRAMUSCULAR | Status: AC
  Administered 2012-08-28: 1000 ug via INTRAMUSCULAR

## 2012-08-28 NOTE — Assessment & Plan Note (Signed)
Some variability noted and recent blood sugars. However, recent A1c was improved. Encourage compliance with medication and diet. Will continue current medications for now. Will plan to recheck A1c with labs in 3 months.

## 2012-08-28 NOTE — Assessment & Plan Note (Signed)
Blood pressure much better controlled today. Encouraged continue compliance with blood pressure medications. Will recheck renal function with labs prior to visit in 3 months.

## 2012-08-28 NOTE — Progress Notes (Signed)
Subjective:    Patient ID: Steve Phillips, male    DOB: Sep 16, 1946, 66 y.o.   MRN: [id number]  HPI 66 year old male with history of coronary artery disease, hypertension, hyperlipidemia, diabetes mellitus presents for followup. At his last visit, blood pressure was markedly elevated but he has not taking his medication that morning. He reports that blood pressures have been better controlled. He reports full compliance with medication. He denies any chest pain, headache, palpitations. In regards to diabetes, he notes some variability in his blood sugars. However, he reports full compliance with medicines. He has not had consistent blood sugars greater than 250 or sugars lower than 60. He continues to have some ongoing fatigue and intermittent swelling particularly in his left leg. He's been wearing a compression stocking with some improvement.  Outpatient Encounter Prescriptions as of 08/28/2012  Medication Sig Dispense Refill  . amLODipine (NORVASC) 10 MG tablet Take 1 tablet (10 mg total) by mouth daily.  180 tablet  3  . aspirin 81 MG tablet Take 243 mg by mouth daily.       . carvedilol (COREG) 25 MG tablet Take 1 tablet (25 mg total) by mouth 2 (two) times daily.  60 tablet  11  . clopidogrel (PLAVIX) 75 MG tablet Take 1 tablet (75 mg total) by mouth daily.  90 tablet  3  . cyanocobalamin (,VITAMIN B-12,) 1000 MCG/ML injection Inject 1 mL (1,000 mcg total) into the muscle every 30 (thirty) days.  30 mL  3  . DULoxetine (CYMBALTA) 60 MG capsule Take 1 capsule (60 mg total) by mouth daily.  90 capsule  3  . glucose blood (FREESTYLE TEST STRIPS) test strip Use to check blood sugar up to three times daily  100 each  11  . HYDROcodone-acetaminophen (VICODIN) 5-500 MG per tablet Take 2 tablets by mouth every 8 (eight) hours as needed for pain.  90 tablet  2  . insulin aspart (NOVOLOG FLEXPEN) 100 UNIT/ML injection Inject 25 units before meals per sliding scale. +  3 vial  3  . isosorbide mononitrate  (IMDUR) 30 MG 24 hr tablet Take 30 mg by mouth daily.        Marland Kitchen LANTUS 100 UNIT/ML injection INJECT 80 UNITS            SUBCUTANEOUS TWICE DAILY  150 mL  3  . lisinopril-hydrochlorothiazide (PRINZIDE,ZESTORETIC) 20-12.5 MG per tablet Take 1 tablet by mouth 2 (two) times daily.  180 tablet  4  . metFORMIN (GLUMETZA) 1000 MG (MOD) 24 hr tablet Take 1 tablet (1,000 mg total) by mouth 2 (two) times daily.  180 tablet  4  . NITROGLYCERIN PO Take by mouth. Sublingual....specific dose is unknown       . pantoprazole (PROTONIX) 40 MG tablet Take 1 tablet (40 mg total) by mouth 2 (two) times daily.  180 tablet  3  . rosuvastatin (CRESTOR) 10 MG tablet Take 10 mg by mouth daily.      Marland Kitchen spironolactone (ALDACTONE) 25 MG tablet Take 1 tablet (25 mg total) by mouth daily.  21 tablet  1  . tiZANidine (ZANAFLEX) 4 MG tablet Take 1 tablet (4 mg total) by mouth 3 (three) times daily as needed.  90 tablet  3  . DISCONTD: HYDROcodone-acetaminophen (VICODIN) 5-500 MG per tablet Take 2 tablets by mouth every 8 (eight) hours as needed for pain.  90 tablet  2  . DISCONTD: tiZANidine (ZANAFLEX) 4 MG tablet Take 1 tablet by mouth 3 (three) times daily as  needed.        BP 140/70  Pulse 68  Temp 97.9 F (36.6 C) (Oral)  Ht 6' (1.829 m)  Wt 257 lb (116.574 kg)  BMI 34.86 kg/m2  SpO2 98%   Review of Systems  Constitutional: Negative for fever, chills, activity change, appetite change, fatigue and unexpected weight change.  Eyes: Negative for visual disturbance.  Respiratory: Negative for cough and shortness of breath.   Cardiovascular: Negative for chest pain, palpitations and leg swelling.  Gastrointestinal: Negative for abdominal pain and abdominal distention.  Genitourinary: Negative for dysuria, urgency and difficulty urinating.  Musculoskeletal: Negative for arthralgias and gait problem.  Skin: Negative for color change and rash.  Hematological: Negative for adenopathy.  Psychiatric/Behavioral: Negative for  disturbed wake/sleep cycle and dysphoric mood. The patient is not nervous/anxious.        Objective:   Physical Exam  Constitutional: He is oriented to person, place, and time. He appears well-developed and well-nourished. No distress.  HENT:  Head: Normocephalic and atraumatic.  Right Ear: External ear normal.  Left Ear: External ear normal.  Nose: Nose normal.  Mouth/Throat: Oropharynx is clear and moist. No oropharyngeal exudate.  Eyes: Conjunctivae normal and EOM are normal. Pupils are equal, round, and reactive to light. Right eye exhibits no discharge. Left eye exhibits no discharge. No scleral icterus.  Neck: Normal range of motion. Neck supple. No tracheal deviation present. No thyromegaly present.  Cardiovascular: Normal rate, regular rhythm and normal heart sounds.  Exam reveals no gallop and no friction rub.   No murmur heard. Pulmonary/Chest: Effort normal and breath sounds normal. No respiratory distress. He has no wheezes. He has no rales. He exhibits no tenderness.  Musculoskeletal: Normal range of motion. He exhibits no edema.  Lymphadenopathy:    He has no cervical adenopathy.  Neurological: He is alert and oriented to person, place, and time. No cranial nerve deficit. Coordination normal.  Skin: Skin is warm and dry. No rash noted. He is not diaphoretic. No erythema. No pallor.  Psychiatric: He has a normal mood and affect. His behavior is normal. Judgment and thought content normal.          Assessment & Plan:

## 2012-08-28 NOTE — Assessment & Plan Note (Signed)
Likely multifactorial with numerous ongoing medical issues including diabetes, hypertension, and iron deficiency anemia. Blood pressure is better controlled. Recent lab showed that hemoglobin was stable with no signs of ongoing blood loss. No focal symptoms such as chest pain or shortness of breath. Encouraged him to continue efforts at healthy diet and regular physical activity. Followup in 3 months.

## 2012-09-17 DIAGNOSIS — B351 Tinea unguium: Secondary | ICD-10-CM | POA: Diagnosis not present

## 2012-09-17 DIAGNOSIS — E1149 Type 2 diabetes mellitus with other diabetic neurological complication: Secondary | ICD-10-CM | POA: Diagnosis not present

## 2012-09-17 DIAGNOSIS — E1142 Type 2 diabetes mellitus with diabetic polyneuropathy: Secondary | ICD-10-CM | POA: Diagnosis not present

## 2012-09-25 ENCOUNTER — Telehealth: Payer: Self-pay

## 2012-09-25 DIAGNOSIS — G629 Polyneuropathy, unspecified: Secondary | ICD-10-CM

## 2012-09-25 NOTE — Telephone Encounter (Signed)
Pt was wondering if [name] could get a referral to see a Neuro Specialist at [facility] his name is Steve Phillips [phone]. His current Neuro physician is no longer there.

## 2012-09-25 NOTE — Telephone Encounter (Signed)
That is fine. I will place order for referral.

## 2012-09-30 ENCOUNTER — Ambulatory Visit (INDEPENDENT_AMBULATORY_CARE_PROVIDER_SITE_OTHER): Payer: Medicare Other | Admitting: *Deleted

## 2012-09-30 DIAGNOSIS — E538 Deficiency of other specified B group vitamins: Secondary | ICD-10-CM

## 2012-09-30 MED ORDER — CYANOCOBALAMIN 1000 MCG/ML IJ SOLN
1000.0000 ug | Freq: Once | INTRAMUSCULAR | Status: AC
  Administered 2012-09-30: 1000 ug via INTRAMUSCULAR

## 2012-10-04 ENCOUNTER — Inpatient Hospital Stay: Payer: Self-pay | Admitting: Internal Medicine

## 2012-10-04 DIAGNOSIS — Z9181 History of falling: Secondary | ICD-10-CM | POA: Diagnosis not present

## 2012-10-04 DIAGNOSIS — I2510 Atherosclerotic heart disease of native coronary artery without angina pectoris: Secondary | ICD-10-CM | POA: Diagnosis not present

## 2012-10-04 DIAGNOSIS — E1365 Other specified diabetes mellitus with hyperglycemia: Secondary | ICD-10-CM | POA: Diagnosis not present

## 2012-10-04 DIAGNOSIS — I200 Unstable angina: Secondary | ICD-10-CM | POA: Diagnosis not present

## 2012-10-04 DIAGNOSIS — G609 Hereditary and idiopathic neuropathy, unspecified: Secondary | ICD-10-CM | POA: Diagnosis present

## 2012-10-04 DIAGNOSIS — R079 Chest pain, unspecified: Secondary | ICD-10-CM | POA: Diagnosis not present

## 2012-10-04 DIAGNOSIS — Z794 Long term (current) use of insulin: Secondary | ICD-10-CM | POA: Diagnosis not present

## 2012-10-04 DIAGNOSIS — Z9861 Coronary angioplasty status: Secondary | ICD-10-CM | POA: Diagnosis not present

## 2012-10-04 DIAGNOSIS — I10 Essential (primary) hypertension: Secondary | ICD-10-CM | POA: Diagnosis not present

## 2012-10-04 DIAGNOSIS — E785 Hyperlipidemia, unspecified: Secondary | ICD-10-CM | POA: Diagnosis not present

## 2012-10-04 DIAGNOSIS — E119 Type 2 diabetes mellitus without complications: Secondary | ICD-10-CM | POA: Diagnosis not present

## 2012-10-04 DIAGNOSIS — R0602 Shortness of breath: Secondary | ICD-10-CM | POA: Diagnosis not present

## 2012-10-04 DIAGNOSIS — Z7982 Long term (current) use of aspirin: Secondary | ICD-10-CM | POA: Diagnosis not present

## 2012-10-04 DIAGNOSIS — Z7902 Long term (current) use of antithrombotics/antiplatelets: Secondary | ICD-10-CM | POA: Diagnosis not present

## 2012-10-04 DIAGNOSIS — F329 Major depressive disorder, single episode, unspecified: Secondary | ICD-10-CM | POA: Diagnosis present

## 2012-10-04 DIAGNOSIS — E538 Deficiency of other specified B group vitamins: Secondary | ICD-10-CM | POA: Diagnosis present

## 2012-10-04 DIAGNOSIS — E669 Obesity, unspecified: Secondary | ICD-10-CM | POA: Diagnosis not present

## 2012-10-04 DIAGNOSIS — Z87891 Personal history of nicotine dependence: Secondary | ICD-10-CM | POA: Diagnosis not present

## 2012-10-04 LAB — HEPATIC FUNCTION PANEL A (ARMC)
Albumin: 3.6 g/dL (ref 3.4–5.0)
Alkaline Phosphatase: 77 U/L (ref 50–136)
Bilirubin, Direct: 0.2 mg/dL (ref 0.00–0.20)
Bilirubin,Total: 0.7 mg/dL (ref 0.2–1.0)
SGOT(AST): 23 U/L (ref 15–37)
Total Protein: 7.2 g/dL (ref 6.4–8.2)

## 2012-10-04 LAB — BASIC METABOLIC PANEL
Anion Gap: 7 (ref 7–16)
Chloride: 102 mmol/L (ref 98–107)
EGFR (African American): >60
EGFR (Non-African Amer.): >60
Glucose: 409 mg/dL — ABNORMAL HIGH (ref 65–99)
Potassium: 3.7 mmol/L (ref 3.5–5.1)
Sodium: 136 mmol/L (ref 136–145)

## 2012-10-04 LAB — CBC
MCH: 25.1 pg — ABNORMAL LOW (ref 26.0–34.0)
MCHC: 32.6 g/dL (ref 32.0–36.0)
MCV: 77 fL — ABNORMAL LOW (ref 80–100)
Platelet: 234 10*3/uL (ref 150–440)
RBC: 5.81 10*6/uL (ref 4.40–5.90)
WBC: 14.4 10*3/uL — ABNORMAL HIGH (ref 3.8–10.6)

## 2012-10-04 LAB — APTT: Activated PTT: 28 secs (ref 23.6–35.9)

## 2012-10-04 LAB — LIPASE, BLOOD: Lipase: 126 U/L (ref 73–393)

## 2012-10-04 LAB — TROPONIN I: Troponin-I: <0.02 ng/mL

## 2012-10-05 LAB — COMPREHENSIVE METABOLIC PANEL
Albumin: 3 g/dL — ABNORMAL LOW (ref 3.4–5.0)
Anion Gap: 7 (ref 7–16)
BUN: 10 mg/dL (ref 7–18)
Calcium, Total: 7.9 mg/dL — ABNORMAL LOW (ref 8.5–10.1)
Chloride: 104 mmol/L (ref 98–107)
Creatinine: 0.86 mg/dL (ref 0.60–1.30)
EGFR (African American): >60
EGFR (Non-African Amer.): >60
Glucose: 278 mg/dL — ABNORMAL HIGH (ref 65–99)
Osmolality: 287 (ref 275–301)
Potassium: 3.2 mmol/L — ABNORMAL LOW (ref 3.5–5.1)
SGOT(AST): 16 U/L (ref 15–37)
SGPT (ALT): 19 U/L (ref 12–78)
Total Protein: 5.8 g/dL — ABNORMAL LOW (ref 6.4–8.2)

## 2012-10-05 LAB — PROTIME-INR: INR: 1

## 2012-10-05 LAB — TROPONIN I
Troponin-I: <0.02 ng/mL
Troponin-I: <0.02 ng/mL

## 2012-10-05 LAB — HEMOGLOBIN A1C: Hemoglobin A1C: 10.3 % — ABNORMAL HIGH (ref 4.2–6.3)

## 2012-10-05 LAB — CBC WITH DIFFERENTIAL/PLATELET
Basophil #: 0.1 10*3/uL (ref 0.0–0.1)
Eosinophil %: 5.2 %
HGB: 12.9 g/dL — ABNORMAL LOW (ref 13.0–18.0)
Lymphocyte #: 2.8 10*3/uL (ref 1.0–3.6)
MCH: 25.8 pg — ABNORMAL LOW (ref 26.0–34.0)
MCHC: 33.8 g/dL (ref 32.0–36.0)
MCV: 76 fL — ABNORMAL LOW (ref 80–100)
Monocyte #: 0.8 x10 3/mm (ref 0.2–1.0)
Neutrophil #: 7.9 10*3/uL — ABNORMAL HIGH (ref 1.4–6.5)
Platelet: 205 10*3/uL (ref 150–440)
RDW: 15.3 % — ABNORMAL HIGH (ref 11.5–14.5)

## 2012-10-05 LAB — CK TOTAL AND CKMB (NOT AT ARMC)
CK, Total: 63 U/L (ref 35–232)
CK, Total: 66 U/L (ref 35–232)
CK-MB: 1.4 ng/mL (ref 0.5–3.6)

## 2012-10-05 LAB — LIPID PANEL: Ldl Cholesterol, Calc: 5 mg/dL (ref 0–100)

## 2012-10-06 DIAGNOSIS — R079 Chest pain, unspecified: Secondary | ICD-10-CM

## 2012-10-06 DIAGNOSIS — R0602 Shortness of breath: Secondary | ICD-10-CM

## 2012-10-07 ENCOUNTER — Telehealth: Payer: Self-pay

## 2012-10-07 NOTE — Telephone Encounter (Signed)
Please see below and advise. thanks 

## 2012-10-07 NOTE — Telephone Encounter (Signed)
Message copied by Marcelle Overlie on Tue Oct 07, 2012  8:46 AM ------      Message from: Kendrick Fries      Created: Tue Oct 07, 2012  8:20 AM       TCM      Discharged [facility]: 10/06/2012      Appointment: Kirke Corin 10/13/2012 2:00

## 2012-10-07 NOTE — Telephone Encounter (Signed)
Ask him to take Lisinopril/HCTZ 20/12.5 mg twice daily.

## 2012-10-07 NOTE — Telephone Encounter (Signed)
TCM call I spoke with Pt's wife who says pt was feeling better since discharge She has questions re: medications Is currently taking lisinopril/hct 20/12.5 mg prior to admission He was d/c home on lisinopril 40 mg and HCTZ 50 mg as well as clonidine 0.1 mg QID PRN high BP Wife is questioning why BP meds were switched from combo drug (lisiniopril/HCT) to individual tabs and is now concerned about hypokalemia since he has no K replacement and K was low in hosp She has yet to pick up these new meds from pharmacy Pt was getting ready to take meds for the day I advised ok to take 2 lisinopril/HCT (total=40/25 mg) this am and I will talk with Dr. Kirke Corin in the meantime about her concerns She is also going to buy a new BP cuff, a manual one, to check BP at home regularly since this was the main issue in hosp that was contributing to Unstable Angina She confirms appt with Dr. Kirke Corin scheduled for 11/25 I will call her back with his response Understanding verb

## 2012-10-07 NOTE — Telephone Encounter (Signed)
tcm

## 2012-10-08 NOTE — Telephone Encounter (Signed)
FYI

## 2012-10-08 NOTE — Telephone Encounter (Signed)
Pt's wife informed Understanding verb Says she got new BP cuff Last night BP=180/92 Pt has clonidine to take PRN high BP Says pt was afraid to take it last night d/t fear of it dropping "too low" I advised, since we are now going to have pt take lisinopril/HCT BID, BP should level out at hs but if not, he can take the clonidine PRN high BP (as prescribed at hospital d/c). I explained how clonidine is shorter acting and may lower BP but will not last long in his system Understanding verb She will continue to monitor BP and HR and will notify us prior to appt of any concerns. Otherwise pt will keep appt scheduled for 11/25

## 2012-10-10 ENCOUNTER — Encounter: Payer: Self-pay | Admitting: Cardiovascular Disease

## 2012-10-13 ENCOUNTER — Ambulatory Visit (INDEPENDENT_AMBULATORY_CARE_PROVIDER_SITE_OTHER): Payer: Medicare Other | Admitting: Cardiovascular Disease

## 2012-10-13 ENCOUNTER — Encounter: Payer: Self-pay | Admitting: Cardiovascular Disease

## 2012-10-13 VITALS — BP 180/62 | HR 67 | Ht 72.0 in | Wt 256.0 lb

## 2012-10-13 DIAGNOSIS — E785 Hyperlipidemia, unspecified: Secondary | ICD-10-CM

## 2012-10-13 DIAGNOSIS — I10 Essential (primary) hypertension: Secondary | ICD-10-CM

## 2012-10-13 DIAGNOSIS — I2510 Atherosclerotic heart disease of native coronary artery without angina pectoris: Secondary | ICD-10-CM | POA: Diagnosis not present

## 2012-10-13 MED ORDER — SPIRONOLACTONE 25 MG PO TABS: 25.0000 mg | ORAL_TABLET | Freq: Every day | ORAL | Status: DC

## 2012-10-13 NOTE — Assessment & Plan Note (Signed)
Recent admission to [facility] with a one episode of prolonged chest pain at rest. Cardiac enzymes were negative. Nuclear stress test showed no evidence of ischemia with normal ejection fraction. No recurrent chest pain since then. Continue medical therapy.

## 2012-10-13 NOTE — Patient Instructions (Addendum)
Start Spironolactone 25 mg once daily.  Labs in 1 week.  Follow up in 3 months.

## 2012-10-13 NOTE — Assessment & Plan Note (Signed)
Lab Results  Component Value Date   CHOL 111 06/30/2012   HDL 44.40 06/30/2012   LDLDIRECT 46.6 06/30/2012   TRIG 282.0* 06/30/2012   CHOLHDL 3 06/30/2012   Continue treatment with Crestor.

## 2012-10-13 NOTE — Assessment & Plan Note (Signed)
The patient has refractory hypertension. Renal artery ultrasound showed no evidence of renal artery stenosis. This was done in August. The patient does not have history of sleep apnea but it might be worth evaluating this at some point. His blood pressure was well controlled when he was on spironolactone which he is currently not taking for unclear reasons. Thus, I will resume spironolactone 25 mg once daily and check basic metabolic profile in one week.

## 2012-10-13 NOTE — Progress Notes (Signed)
HPI  This is a 66 year old male who is here today for followup visit. He has known history of coronary artery disease status post multiple PCI in the past at [facility]. He presented in April of this year with chest pain. He underwent cardiac catheterization which showed patent stents. However, there was a 90% stenosis in the proximal first diagonal. There was 40% disease in the mid LAD and moderate disease in the left circumflex. He underwent an angioplasty and drug-eluting stent placement to the diagonal without complications.  he has known history of refractory hypertension. Renal artery duplex ultrasound showed no evidence of renal artery stenosis. He presented recently to [facility] with chest pain at rest. He was hypertensive on presentation. He ruled out for myocardial infarction. He underwent a pharmacologic nuclear stress test which was normal. He had an echocardiogram done which showed normal LV systolic function, mild left ventricular hypertrophy and mild diastolic dysfunction. No significant valvular abnormalities. Overall, he has been feeling better. No recurrent chest pain. He has been having problems with elevated blood pressure. In August, I placed him on spironolactone with excellent response. For some reasons, he has not been taking this medication. His prescription might have ran out.  Allergies  Allergen Reactions  . Codeine     Derivatives Nausea.  . Trazodone And Nefazodone      Current Outpatient Prescriptions on File Prior to Visit  Medication Sig Dispense Refill  . amLODipine (NORVASC) 10 MG tablet Take 1 tablet (10 mg total) by mouth daily.  180 tablet  3  . aspirin 81 MG tablet Take 243 mg by mouth daily.       . carvedilol (COREG) 25 MG tablet Take 1 tablet (25 mg total) by mouth 2 (two) times daily.  60 tablet  11  . clopidogrel (PLAVIX) 75 MG tablet Take 1 tablet (75 mg total) by mouth daily.  90 tablet  3  . cyanocobalamin (,VITAMIN B-12,) 1000 MCG/ML  injection Inject 1 mL (1,000 mcg total) into the muscle every 30 (thirty) days.  30 mL  3  . DULoxetine (CYMBALTA) 60 MG capsule Take 1 capsule (60 mg total) by mouth daily.  90 capsule  3  . glucose blood (FREESTYLE TEST STRIPS) test strip Use to check blood sugar up to three times daily  100 each  11  . HYDROcodone-acetaminophen (VICODIN) 5-500 MG per tablet Take 2 tablets by mouth every 8 (eight) hours as needed for pain.  90 tablet  2  . insulin aspart (NOVOLOG FLEXPEN) 100 UNIT/ML injection Inject 25 units before meals per sliding scale. +  3 vial  3  . isosorbide mononitrate (IMDUR) 30 MG 24 hr tablet Take 60 mg by mouth daily.       Marland Kitchen LANTUS 100 UNIT/ML injection INJECT 80 UNITS            SUBCUTANEOUS TWICE DAILY  150 mL  3  . lisinopril-hydrochlorothiazide (PRINZIDE,ZESTORETIC) 20-12.5 MG per tablet Take 1 tablet by mouth 2 (two) times daily.  180 tablet  4  . metFORMIN (GLUMETZA) 1000 MG (MOD) 24 hr tablet Take 1 tablet (1,000 mg total) by mouth 2 (two) times daily.  180 tablet  4  . NITROGLYCERIN PO Take by mouth. Sublingual....specific dose is unknown       . pantoprazole (PROTONIX) 40 MG tablet Take 1 tablet (40 mg total) by mouth 2 (two) times daily.  180 tablet  3  . pregabalin (LYRICA) 75 MG capsule Take 75 mg by mouth  daily.      . rosuvastatin (CRESTOR) 10 MG tablet Take 10 mg by mouth daily.      Marland Kitchen tiZANidine (ZANAFLEX) 4 MG tablet Take 1 tablet (4 mg total) by mouth 3 (three) times daily as needed.  90 tablet  3   Current Facility-Administered Medications on File Prior to Visit  Medication Dose Route Frequency Provider Last Rate Last Dose  . promethazine (PHENERGAN) injection 25 mg  25 mg Intramuscular Once Shelia Media, MD         Past Medical History  Diagnosis Date  . Shingles   . Diabetes mellitus   . Hyperlipidemia   . Hypertension   . Broken leg     left...s/p pins  . Lower leg pain     chronic ,left leg  . Acute angina   . Depression   . CAD (coronary  artery disease)     s/p multiple PCI with stent RCA,LAD and obtuse marginal,followed @ [facility] on the accord study.., cath 02/2012 90% D1, s/p drug-eluting stent Dr. Kirke Corin  . Falls 09/2011  . Dizziness      Past Surgical History  Procedure Date  . Appendectomy   . Cholecystectomy   . Left leg surgery   . Left foot surgery   . Drainage port in left testicle   . Cardiac catheterization 02/2012     Family History  Problem Relation Age of Onset  . Cancer Mother     BREAST  . Heart disease Mother     STENT  . Cancer Father     THROAT  . Heart disease Sister     STENT; HTN  . Heart attack Brother   . Hypertension Brother   . Hypertension Sister      History   Social History  . Marital Status: Married    Spouse Name: N/A    Number of Children: N/A  . Years of Education: N/A   Occupational History  . Not on file.   Social History Main Topics  . Smoking status: Former Smoker    Quit date: 11/08/1971  . Smokeless tobacco: Never Used  . Alcohol Use: No  . Drug Use: No  . Sexually Active: Not on file   Other Topics Concern  . Not on file   Social History Narrative  . No narrative on file      PHYSICAL EXAM   BP 180/62  Pulse 67  Ht 6' (1.829 m)  Wt 256 lb (116.121 kg)  BMI 34.72 kg/m2 Constitutional: He is oriented to person, place, and time. He appears well-developed and well-nourished. No distress.  HENT: No nasal discharge.  Head: Normocephalic and atraumatic.  Eyes: Pupils are equal and round. Right eye exhibits no discharge. Left eye exhibits no discharge.  Neck: Normal range of motion. Neck supple. No JVD present. No thyromegaly present.  Cardiovascular: Normal rate, regular rhythm, normal heart sounds and. Exam reveals no gallop and no friction rub. No murmur heard.  Pulmonary/Chest: Effort normal and breath sounds normal. No stridor. No respiratory distress. He has no wheezes. He has no rales. He exhibits no tenderness.  Abdominal: Soft. Bowel  sounds are normal. He exhibits no distension. There is no tenderness. There is no rebound and no guarding.  Musculoskeletal: Normal range of motion. He exhibits no edema and no tenderness.  Neurological: He is alert and oriented to person, place, and time. Coordination normal.  Skin: Skin is warm and dry. No rash noted. He is not diaphoretic. No  erythema. No pallor.  Psychiatric: He has a normal mood and affect. His behavior is normal. Judgment and thought content normal.        ASSESSMENT AND PLAN

## 2012-10-17 ENCOUNTER — Ambulatory Visit (INDEPENDENT_AMBULATORY_CARE_PROVIDER_SITE_OTHER): Payer: Medicare Other | Admitting: Internal Medicine

## 2012-10-17 ENCOUNTER — Encounter: Payer: Self-pay | Admitting: Internal Medicine

## 2012-10-17 VITALS — BP 138/72 | HR 62 | Temp 97.5°F | Resp 16 | Wt 256.8 lb

## 2012-10-17 DIAGNOSIS — I2510 Atherosclerotic heart disease of native coronary artery without angina pectoris: Secondary | ICD-10-CM

## 2012-10-17 DIAGNOSIS — E119 Type 2 diabetes mellitus without complications: Secondary | ICD-10-CM | POA: Diagnosis not present

## 2012-10-17 DIAGNOSIS — E118 Type 2 diabetes mellitus with unspecified complications: Secondary | ICD-10-CM | POA: Diagnosis not present

## 2012-10-17 DIAGNOSIS — I10 Essential (primary) hypertension: Secondary | ICD-10-CM | POA: Diagnosis not present

## 2012-10-17 DIAGNOSIS — E1142 Type 2 diabetes mellitus with diabetic polyneuropathy: Secondary | ICD-10-CM

## 2012-10-17 DIAGNOSIS — E1149 Type 2 diabetes mellitus with other diabetic neurological complication: Secondary | ICD-10-CM

## 2012-10-17 NOTE — Progress Notes (Signed)
Subjective:    Patient ID: Steve Phillips, male    DOB: November 02, 1946, 66 y.o.   MRN: [id number]  HPI 66 year old male with history of coronary artery disease, hypertension, hyperlipidemia presents for followup after recent hospitalization for chest pain. He had a prolonged episode of anterior chest pain and was admitted at [facility]. He underwent cardiac markers and stress test which were normal. He was ultimately discharged home. His blood pressure was persistently elevated and he was placed on spironolactone. He reports some improvement in blood pressure since that time. In regards to blood sugars, he reports they have been quite variable. He continues to have some blood sugars greater than 200. He reports full compliance with his medications. Since discharge, he denies any recurrent episodes of chest pain. He reports he is generally feeling well.  Outpatient Encounter Prescriptions as of 10/17/2012  Medication Sig Dispense Refill  . amLODipine (NORVASC) 10 MG tablet Take 1 tablet (10 mg total) by mouth daily.  180 tablet  3  . aspirin 81 MG tablet Take 243 mg by mouth daily.       . carvedilol (COREG) 25 MG tablet Take 1 tablet (25 mg total) by mouth 2 (two) times daily.  60 tablet  11  . clopidogrel (PLAVIX) 75 MG tablet Take 1 tablet (75 mg total) by mouth daily.  90 tablet  3  . cyanocobalamin (,VITAMIN B-12,) 1000 MCG/ML injection Inject 1 mL (1,000 mcg total) into the muscle every 30 (thirty) days.  30 mL  3  . DULoxetine (CYMBALTA) 60 MG capsule Take 1 capsule (60 mg total) by mouth daily.  90 capsule  3  . glucose blood (FREESTYLE TEST STRIPS) test strip Use to check blood sugar up to three times daily  100 each  11  . HYDROcodone-acetaminophen (VICODIN) 5-500 MG per tablet Take 2 tablets by mouth every 8 (eight) hours as needed for pain.  90 tablet  2  . insulin aspart (NOVOLOG FLEXPEN) 100 UNIT/ML injection Inject 25 units before meals per sliding scale. +  3 vial  3  . isosorbide mononitrate  (IMDUR) 30 MG 24 hr tablet Take 60 mg by mouth daily.       Marland Kitchen LANTUS 100 UNIT/ML injection INJECT 80 UNITS            SUBCUTANEOUS TWICE DAILY  150 mL  3  . lisinopril-hydrochlorothiazide (PRINZIDE,ZESTORETIC) 20-12.5 MG per tablet Take 1 tablet by mouth 2 (two) times daily.  180 tablet  4  . metFORMIN (GLUMETZA) 1000 MG (MOD) 24 hr tablet Take 1 tablet (1,000 mg total) by mouth 2 (two) times daily.  180 tablet  4  . NITROGLYCERIN PO Take by mouth. Sublingual....specific dose is unknown       . pantoprazole (PROTONIX) 40 MG tablet Take 1 tablet (40 mg total) by mouth 2 (two) times daily.  180 tablet  3  . pregabalin (LYRICA) 75 MG capsule Take 75 mg by mouth daily.      . rosuvastatin (CRESTOR) 10 MG tablet Take 10 mg by mouth daily.      Marland Kitchen spironolactone (ALDACTONE) 25 MG tablet Take 1 tablet (25 mg total) by mouth daily.  90 tablet  3  . tiZANidine (ZANAFLEX) 4 MG tablet Take 1 tablet (4 mg total) by mouth 3 (three) times daily as needed.  90 tablet  3    BP 138/72  Pulse 62  Temp 97.5 F (36.4 C) (Oral)  Resp 16  Wt 256 lb 12 oz (116.461 kg)  SpO2 97%  Review of Systems  Constitutional: Negative for fever, chills, activity change, appetite change, fatigue and unexpected weight change.  Eyes: Negative for visual disturbance.  Respiratory: Negative for cough and shortness of breath.   Cardiovascular: Negative for chest pain, palpitations and leg swelling.  Gastrointestinal: Negative for abdominal pain and abdominal distention.  Genitourinary: Negative for dysuria, urgency and difficulty urinating.  Musculoskeletal: Positive for gait problem. Negative for arthralgias.  Skin: Negative for color change and rash.  Hematological: Negative for adenopathy.  Psychiatric/Behavioral: Negative for sleep disturbance and dysphoric mood. The patient is not nervous/anxious.        Objective:   Physical Exam  Constitutional: He is oriented to person, place, and time. He appears well-developed  and well-nourished. No distress.  HENT:  Head: Normocephalic and atraumatic.  Right Ear: External ear normal.  Left Ear: External ear normal.  Nose: Nose normal.  Mouth/Throat: Oropharynx is clear and moist. No oropharyngeal exudate.  Eyes: Conjunctivae normal and EOM are normal. Pupils are equal, round, and reactive to light. Right eye exhibits no discharge. Left eye exhibits no discharge. No scleral icterus.  Neck: Normal range of motion. Neck supple. No tracheal deviation present. No thyromegaly present.  Cardiovascular: Normal rate, regular rhythm and normal heart sounds.  Exam reveals no gallop and no friction rub.   No murmur heard. Pulmonary/Chest: Effort normal and breath sounds normal. No respiratory distress. He has no wheezes. He has no rales. He exhibits no tenderness.  Musculoskeletal: Normal range of motion. He exhibits no edema.  Lymphadenopathy:    He has no cervical adenopathy.  Neurological: He is alert and oriented to person, place, and time. No cranial nerve deficit. Coordination normal.  Skin: Skin is warm and dry. No rash noted. He is not diaphoretic. No erythema. No pallor.  Psychiatric: He has a normal mood and affect. His behavior is normal. Judgment and thought content normal.          Assessment & Plan:

## 2012-10-17 NOTE — Assessment & Plan Note (Signed)
BP much better controlled today with Spironolactone. Will check electrolytes with labs on Monday. Follow up 1 month and prn.

## 2012-10-17 NOTE — Assessment & Plan Note (Signed)
S/p recent admission at [facility] for chest pain. Evaluation including cardiac markers, nuclear stress test normal. No recurrent chest pain since discharge. Will continue current medications. Follow up 1 month.

## 2012-10-17 NOTE — Assessment & Plan Note (Signed)
BG recently elevated during hospitalizations. Improving since discharge home. Will continue current medications. Follow up with A1c in 1 month.

## 2012-10-20 ENCOUNTER — Other Ambulatory Visit: Payer: Medicare Other

## 2012-10-21 DIAGNOSIS — H903 Sensorineural hearing loss, bilateral: Secondary | ICD-10-CM | POA: Diagnosis not present

## 2012-10-21 DIAGNOSIS — E119 Type 2 diabetes mellitus without complications: Secondary | ICD-10-CM | POA: Diagnosis not present

## 2012-10-21 DIAGNOSIS — G589 Mononeuropathy, unspecified: Secondary | ICD-10-CM | POA: Diagnosis not present

## 2012-10-21 DIAGNOSIS — R42 Dizziness and giddiness: Secondary | ICD-10-CM | POA: Diagnosis not present

## 2012-10-22 ENCOUNTER — Encounter: Payer: Self-pay | Admitting: Cardiovascular Disease

## 2012-10-23 ENCOUNTER — Other Ambulatory Visit: Payer: Medicare Other

## 2012-10-24 ENCOUNTER — Telehealth: Payer: Self-pay

## 2012-10-24 NOTE — Telephone Encounter (Signed)
Attempted to reach pt to remind him he is past due for labs 1st # "nonworking number" 2nd # nonworking #

## 2012-10-29 ENCOUNTER — Telehealth: Payer: Self-pay

## 2012-10-29 ENCOUNTER — Other Ambulatory Visit: Payer: Medicare Other

## 2012-10-29 ENCOUNTER — Other Ambulatory Visit: Payer: Self-pay | Admitting: Internal Medicine

## 2012-10-29 NOTE — Telephone Encounter (Signed)
The patient is not coming for lab work today because he did not take the spironolactone like he was supposed to. He took it for maybe four days. The patient will start the spironolactone back today and will have blood work next Thursday. Please advise if need to do anything different.

## 2012-10-29 NOTE — Telephone Encounter (Signed)
Pt's wife informed pt is due for labs She says he will try to come this afternoon

## 2012-10-30 ENCOUNTER — Telehealth: Payer: Self-pay | Admitting: Internal Medicine

## 2012-10-30 NOTE — Telephone Encounter (Signed)
Please call pt when b12 comes in °

## 2012-10-31 ENCOUNTER — Ambulatory Visit: Payer: Medicare Other

## 2012-11-05 NOTE — Telephone Encounter (Signed)
Schedule for B12 

## 2012-11-06 ENCOUNTER — Ambulatory Visit (INDEPENDENT_AMBULATORY_CARE_PROVIDER_SITE_OTHER): Payer: Medicare Other

## 2012-11-06 DIAGNOSIS — I10 Essential (primary) hypertension: Secondary | ICD-10-CM | POA: Diagnosis not present

## 2012-11-07 LAB — BASIC METABOLIC PANEL
BUN/Creatinine Ratio: 21 (ref 10–22)
Creatinine, Ser: 1.12 mg/dL (ref 0.76–1.27)
GFR calc non Af Amer: 67 mL/min/{1.73_m2} (ref >59)
Sodium: 135 mmol/L (ref 134–144)

## 2012-11-10 DIAGNOSIS — M19019 Primary osteoarthritis, unspecified shoulder: Secondary | ICD-10-CM | POA: Diagnosis not present

## 2012-11-10 DIAGNOSIS — M25519 Pain in unspecified shoulder: Secondary | ICD-10-CM | POA: Diagnosis not present

## 2012-11-10 NOTE — Telephone Encounter (Signed)
Pt wanted to get b12 @ dr appointment 12/30

## 2012-11-17 ENCOUNTER — Encounter: Payer: Self-pay | Admitting: Internal Medicine

## 2012-11-17 ENCOUNTER — Ambulatory Visit (INDEPENDENT_AMBULATORY_CARE_PROVIDER_SITE_OTHER): Payer: Medicare Other | Admitting: Internal Medicine

## 2012-11-17 VITALS — BP 130/82 | HR 58 | Temp 97.7°F | Resp 16 | Wt 254.5 lb

## 2012-11-17 DIAGNOSIS — E1142 Type 2 diabetes mellitus with diabetic polyneuropathy: Secondary | ICD-10-CM | POA: Diagnosis not present

## 2012-11-17 DIAGNOSIS — E119 Type 2 diabetes mellitus without complications: Secondary | ICD-10-CM | POA: Diagnosis not present

## 2012-11-17 DIAGNOSIS — R269 Unspecified abnormalities of gait and mobility: Secondary | ICD-10-CM | POA: Diagnosis not present

## 2012-11-17 DIAGNOSIS — E118 Type 2 diabetes mellitus with unspecified complications: Secondary | ICD-10-CM | POA: Diagnosis not present

## 2012-11-17 DIAGNOSIS — E785 Hyperlipidemia, unspecified: Secondary | ICD-10-CM

## 2012-11-17 DIAGNOSIS — E1149 Type 2 diabetes mellitus with other diabetic neurological complication: Secondary | ICD-10-CM

## 2012-11-17 DIAGNOSIS — E1165 Type 2 diabetes mellitus with hyperglycemia: Secondary | ICD-10-CM

## 2012-11-17 DIAGNOSIS — E538 Deficiency of other specified B group vitamins: Secondary | ICD-10-CM | POA: Diagnosis not present

## 2012-11-17 DIAGNOSIS — I10 Essential (primary) hypertension: Secondary | ICD-10-CM | POA: Diagnosis not present

## 2012-11-17 DIAGNOSIS — R51 Headache: Secondary | ICD-10-CM

## 2012-11-17 LAB — COMPREHENSIVE METABOLIC PANEL
BUN: 23 mg/dL (ref 6–23)
CO2: 25 mEq/L (ref 19–32)
Calcium: 9 mg/dL (ref 8.4–10.5)
Chloride: 99 mEq/L (ref 96–112)
Creatinine, Ser: 1 mg/dL (ref 0.4–1.5)
GFR: 79.81 mL/min (ref >60.00)

## 2012-11-17 MED ORDER — TIZANIDINE HCL 4 MG PO TABS: 4.0000 mg | ORAL_TABLET | Freq: Three times a day (TID) | ORAL | Status: DC | PRN

## 2012-11-17 MED ORDER — METFORMIN HCL 1000 MG PO TABS: 1000.0000 mg | ORAL_TABLET | Freq: Two times a day (BID) | ORAL | Status: DC

## 2012-11-17 MED ORDER — CYANOCOBALAMIN 1000 MCG/ML IJ SOLN
1000.0000 ug | Freq: Once | INTRAMUSCULAR | Status: AC
  Administered 2012-11-17: 1000 ug via INTRAMUSCULAR

## 2012-11-17 MED ORDER — LIRAGLUTIDE 18 MG/3ML ~~LOC~~ SOLN: 1.8000 mg | Freq: Every day | SUBCUTANEOUS | Status: DC

## 2012-11-17 MED ORDER — LISINOPRIL-HYDROCHLOROTHIAZIDE 20-12.5 MG PO TABS: 1.0000 | ORAL_TABLET | Freq: Two times a day (BID) | ORAL | Status: DC

## 2012-11-17 NOTE — Assessment & Plan Note (Signed)
BP improved today. Continue current medications.

## 2012-11-17 NOTE — Progress Notes (Signed)
Subjective:    Patient ID: Steve Phillips, male    DOB: [birth year], 66 y.o.   MRN: [id number]  HPI 66YO male with h/o DM, HTN, gait instability presents for follow up. Had a fall over the Christmas Holidays. Fell from standing. Unsure how/why he lost balance. Noted some scrapes on his knees, but no major injuries, no head injury.  Was not evaluated for this. Also notes that BG have been elevated, 300->400s. Has not been compliant with insulin. Has not been following any particular diet. Has not been exercising.  Outpatient Encounter Prescriptions as of 11/17/2012  Medication Sig Dispense Refill  . amLODipine (NORVASC) 10 MG tablet Take 1 tablet (10 mg total) by mouth daily.  180 tablet  3  . aspirin 81 MG tablet Take 243 mg by mouth daily.       . carvedilol (COREG) 25 MG tablet Take 1 tablet (25 mg total) by mouth 2 (two) times daily.  60 tablet  11  . clopidogrel (PLAVIX) 75 MG tablet Take 1 tablet (75 mg total) by mouth daily.  90 tablet  3  . cyanocobalamin (,VITAMIN B-12,) 1000 MCG/ML injection Inject 1 mL (1,000 mcg total) into the muscle every 30 (thirty) days.  30 mL  3  . DULoxetine (CYMBALTA) 60 MG capsule Take 1 capsule (60 mg total) by mouth daily.  90 capsule  3  . glucose blood (FREESTYLE TEST STRIPS) test strip Use to check blood sugar up to three times daily  100 each  11  . HYDROcodone-acetaminophen (VICODIN) 5-500 MG per tablet Take 2 tablets by mouth every 8 (eight) hours as needed for pain.  90 tablet  2  . insulin aspart (NOVOLOG FLEXPEN) 100 UNIT/ML injection Inject 25 units before meals per sliding scale. +  3 vial  3  . isosorbide mononitrate (IMDUR) 30 MG 24 hr tablet Take 60 mg by mouth daily.       Marland Kitchen LANTUS 100 UNIT/ML injection INJECT 80 UNITS            SUBCUTANEOUS TWICE DAILY  150 mL  3  . lisinopril-hydrochlorothiazide (PRINZIDE,ZESTORETIC) 20-12.5 MG per tablet Take 1 tablet by mouth 2 (two) times daily.  180 tablet  4  . metFORMIN (GLUCOPHAGE) 1000 MG tablet Take  1 tablet (1,000 mg total) by mouth 2 (two) times daily.  180 tablet  3  . NITROGLYCERIN PO Take by mouth. Sublingual....specific dose is unknown       . pantoprazole (PROTONIX) 40 MG tablet Take 1 tablet (40 mg total) by mouth 2 (two) times daily.  180 tablet  3  . pregabalin (LYRICA) 75 MG capsule Take 75 mg by mouth daily.      . rosuvastatin (CRESTOR) 10 MG tablet Take 10 mg by mouth daily.      Marland Kitchen spironolactone (ALDACTONE) 25 MG tablet Take 1 tablet (25 mg total) by mouth daily.  90 tablet  3  . tiZANidine (ZANAFLEX) 4 MG tablet Take 1 tablet (4 mg total) by mouth 3 (three) times daily as needed.  270 tablet  3  . Liraglutide (VICTOZA) 18 MG/3ML SOLN Inject 0.3 mLs (1.8 mg total) into the skin daily.  12 mL  4   Facility-Administered Encounter Medications as of 11/17/2012  Medication Dose Route Frequency Provider Last Rate Last Dose  . promethazine (PHENERGAN) injection 25 mg  25 mg Intramuscular Once Shelia Media, MD       BP 130/82  Pulse 58  Temp 97.7 F (36.5 C) (Oral)  Resp 16  Wt 254 lb 8 oz (115.44 kg)  SpO2 98%  Review of Systems  Constitutional: Positive for fatigue. Negative for fever, chills, activity change, appetite change and unexpected weight change.  Eyes: Negative for visual disturbance.  Respiratory: Negative for cough and shortness of breath.   Cardiovascular: Negative for chest pain, palpitations and leg swelling.  Gastrointestinal: Negative for abdominal pain and abdominal distention.  Genitourinary: Negative for dysuria, urgency and difficulty urinating.  Musculoskeletal: Positive for gait problem. Negative for arthralgias.  Skin: Negative for color change and rash.  Hematological: Negative for adenopathy.  Psychiatric/Behavioral: Negative for sleep disturbance and dysphoric mood. The patient is not nervous/anxious.        Objective:   Physical Exam  Constitutional: He is oriented to person, place, and time. He appears well-developed and  well-nourished. No distress.  HENT:  Head: Normocephalic and atraumatic.  Right Ear: External ear normal.  Left Ear: External ear normal.  Nose: Nose normal.  Mouth/Throat: Oropharynx is clear and moist. No oropharyngeal exudate.  Eyes: Conjunctivae normal and EOM are normal. Pupils are equal, round, and reactive to light. Right eye exhibits no discharge. Left eye exhibits no discharge. No scleral icterus.  Neck: Normal range of motion. Neck supple. No tracheal deviation present. No thyromegaly present.  Cardiovascular: Normal rate, regular rhythm and normal heart sounds.  Exam reveals no gallop and no friction rub.   No murmur heard. Pulmonary/Chest: Effort normal and breath sounds normal. No respiratory distress. He has no wheezes. He has no rales. He exhibits no tenderness.  Musculoskeletal: Normal range of motion. He exhibits no edema.  Lymphadenopathy:    He has no cervical adenopathy.  Neurological: He is alert and oriented to person, place, and time. No cranial nerve deficit. Coordination normal.  Skin: Skin is warm and dry. No rash noted. He is not diaphoretic. No erythema. No pallor.  Psychiatric: He has a normal mood and affect. His behavior is normal. Judgment and thought content normal.          Assessment & Plan:

## 2012-11-17 NOTE — Assessment & Plan Note (Signed)
Blood sugars poorly controlled, esp after recent cortisone injection.  Strongly encouraged better compliance with diet and exercise. Encouraged limitation of intake of processed carbohydrates and setting of goal of physical activity daily.  Will check A1c today. Will start Victoza 0.6mg  [place] daily, with plan to increase to 1.8mg  daily after 1 week. We discussed referral back to endocrinology for discussion of insulin pump. Will revisit this in 1 month at follow up.

## 2012-11-17 NOTE — Addendum Note (Signed)
Addended by: Jackson Latino on: 11/17/2012 03:16 PM   Modules accepted: Orders

## 2012-11-17 NOTE — Assessment & Plan Note (Signed)
Worsening gait instability secondary to diabetic neuropathy. As above, strongly encouraged better compliance with diet/medications in effort to better control BG.

## 2012-11-19 DIAGNOSIS — C44621 Squamous cell carcinoma of skin of unspecified upper limb, including shoulder: Secondary | ICD-10-CM

## 2012-11-19 HISTORY — DX: Squamous cell carcinoma of skin of unspecified upper limb, including shoulder: C44.621

## 2012-11-19 HISTORY — PX: SQUAMOUS CELL CARCINOMA EXCISION: SHX2433

## 2012-11-26 ENCOUNTER — Ambulatory Visit: Payer: Medicare Other | Admitting: Internal Medicine

## 2012-11-28 ENCOUNTER — Other Ambulatory Visit: Payer: Self-pay | Admitting: *Deleted

## 2012-11-28 DIAGNOSIS — E1149 Type 2 diabetes mellitus with other diabetic neurological complication: Secondary | ICD-10-CM

## 2012-11-28 DIAGNOSIS — E1165 Type 2 diabetes mellitus with hyperglycemia: Secondary | ICD-10-CM

## 2012-11-28 MED ORDER — LIRAGLUTIDE 18 MG/3ML ~~LOC~~ SOLN: 1.8000 mg | Freq: Every day | SUBCUTANEOUS | Status: DC

## 2012-11-28 NOTE — Telephone Encounter (Signed)
Rx was sent to [facility].  Spoke with rep and they stated that they didn't receive the Rx that was sent on 11/17/2012.

## 2012-12-17 ENCOUNTER — Ambulatory Visit: Payer: Medicare Other | Admitting: Internal Medicine

## 2012-12-26 ENCOUNTER — Encounter: Payer: Self-pay | Admitting: Internal Medicine

## 2012-12-26 ENCOUNTER — Ambulatory Visit (INDEPENDENT_AMBULATORY_CARE_PROVIDER_SITE_OTHER): Payer: Medicare Other | Admitting: Internal Medicine

## 2012-12-26 VITALS — BP 148/78 | HR 71 | Temp 97.9°F | Wt 250.0 lb

## 2012-12-26 DIAGNOSIS — E1149 Type 2 diabetes mellitus with other diabetic neurological complication: Secondary | ICD-10-CM

## 2012-12-26 DIAGNOSIS — I10 Essential (primary) hypertension: Secondary | ICD-10-CM | POA: Diagnosis not present

## 2012-12-26 DIAGNOSIS — E119 Type 2 diabetes mellitus without complications: Secondary | ICD-10-CM | POA: Diagnosis not present

## 2012-12-26 DIAGNOSIS — E1142 Type 2 diabetes mellitus with diabetic polyneuropathy: Secondary | ICD-10-CM

## 2012-12-26 NOTE — Progress Notes (Signed)
Subjective:    Patient ID: Steve Phillips, male    DOB: [birth year], 67 y.o.   MRN: [id number]  HPI 67YO male with CAD, DM, HTN, obesity presents for follow up. At last visit, noted to have elevated BG with A1c 12%. Was started on Victoza. Tolerating relatively well with some nausea. Notes decreased appetite. Started an exercise program and is water-walking and lifting weights 5 days per week. Has lost 4lbs. No new concerns today.  Outpatient Encounter Prescriptions as of 12/26/2012  Medication Sig Dispense Refill  . amLODipine (NORVASC) 10 MG tablet Take 1 tablet (10 mg total) by mouth daily.  180 tablet  3  . aspirin 81 MG tablet Take 243 mg by mouth daily.       . carvedilol (COREG) 25 MG tablet Take 1 tablet (25 mg total) by mouth 2 (two) times daily.  60 tablet  11  . clopidogrel (PLAVIX) 75 MG tablet Take 1 tablet (75 mg total) by mouth daily.  90 tablet  3  . cyanocobalamin (,VITAMIN B-12,) 1000 MCG/ML injection Inject 1 mL (1,000 mcg total) into the muscle every 30 (thirty) days.  30 mL  3  . DULoxetine (CYMBALTA) 60 MG capsule Take 1 capsule (60 mg total) by mouth daily.  90 capsule  3  . glucose blood (FREESTYLE TEST STRIPS) test strip Use to check blood sugar up to three times daily  100 each  11  . HYDROcodone-acetaminophen (VICODIN) 5-500 MG per tablet Take 2 tablets by mouth every 8 (eight) hours as needed for pain.  90 tablet  2  . insulin aspart (NOVOLOG FLEXPEN) 100 UNIT/ML injection Inject 25 units before meals per sliding scale. +  3 vial  3  . isosorbide mononitrate (IMDUR) 30 MG 24 hr tablet Take 60 mg by mouth daily.       Marland Kitchen LANTUS 100 UNIT/ML injection INJECT 80 UNITS            SUBCUTANEOUS TWICE DAILY  150 mL  3  . Liraglutide (VICTOZA) 18 MG/3ML SOLN Inject 0.3 mLs (1.8 mg total) into the skin daily.  27 mL  3  . lisinopril-hydrochlorothiazide (PRINZIDE,ZESTORETIC) 20-12.5 MG per tablet Take 1 tablet by mouth 2 (two) times daily.  180 tablet  4  . metFORMIN (GLUCOPHAGE)  1000 MG tablet Take 1 tablet (1,000 mg total) by mouth 2 (two) times daily.  180 tablet  3  . NITROGLYCERIN PO Take by mouth. Sublingual....specific dose is unknown       . pantoprazole (PROTONIX) 40 MG tablet Take 1 tablet (40 mg total) by mouth 2 (two) times daily.  180 tablet  3  . pregabalin (LYRICA) 75 MG capsule Take 75 mg by mouth daily.      . rosuvastatin (CRESTOR) 10 MG tablet Take 10 mg by mouth daily.      Marland Kitchen spironolactone (ALDACTONE) 25 MG tablet Take 1 tablet (25 mg total) by mouth daily.  90 tablet  3  . tiZANidine (ZANAFLEX) 4 MG tablet Take 1 tablet (4 mg total) by mouth 3 (three) times daily as needed.  270 tablet  3    BP 148/78  Pulse 71  Temp 97.9 F (36.6 C) (Oral)  Wt 250 lb (113.399 kg)  SpO2 96%  Review of Systems  Constitutional: Negative for fever, chills, activity change, appetite change, fatigue and unexpected weight change.  Eyes: Negative for visual disturbance.  Respiratory: Negative for cough and shortness of breath.   Cardiovascular: Negative for chest pain, palpitations and  leg swelling.  Gastrointestinal: Negative for abdominal pain and abdominal distention.  Genitourinary: Negative for dysuria, urgency and difficulty urinating.  Musculoskeletal: Positive for gait problem. Negative for arthralgias.  Skin: Negative for color change and rash.  Hematological: Negative for adenopathy.  Psychiatric/Behavioral: Negative for sleep disturbance and dysphoric mood. The patient is not nervous/anxious.        Objective:   Physical Exam  Constitutional: He is oriented to person, place, and time. He appears well-developed and well-nourished. No distress.  HENT:  Head: Normocephalic and atraumatic.  Right Ear: External ear normal.  Left Ear: External ear normal.  Nose: Nose normal.  Mouth/Throat: Oropharynx is clear and moist. No oropharyngeal exudate.  Eyes: Conjunctivae normal and EOM are normal. Pupils are equal, round, and reactive to light. Right eye  exhibits no discharge. Left eye exhibits no discharge. No scleral icterus.  Neck: Normal range of motion. Neck supple. No tracheal deviation present. No thyromegaly present.  Cardiovascular: Normal rate, regular rhythm and normal heart sounds.  Exam reveals no gallop and no friction rub.   No murmur heard. Pulmonary/Chest: Effort normal and breath sounds normal. No respiratory distress. He has no wheezes. He has no rales. He exhibits no tenderness.  Musculoskeletal: Normal range of motion. He exhibits no edema.  Lymphadenopathy:    He has no cervical adenopathy.  Neurological: He is alert and oriented to person, place, and time. No cranial nerve deficit. Coordination normal.  Skin: Skin is warm and dry. No rash noted. He is not diaphoretic. No erythema. No pallor.  Psychiatric: He has a normal mood and affect. His behavior is normal. Judgment and thought content normal.          Assessment & Plan:

## 2012-12-26 NOTE — Assessment & Plan Note (Signed)
  BP Readings from Last 3 Encounters:  12/26/12 148/78  11/17/12 130/82  10/17/12 138/72   BP well controlled on current medications. Will continue.

## 2012-12-26 NOTE — Assessment & Plan Note (Signed)
Lab Results  Component Value Date   HGBA1C 12.5* 11/17/2012   Recent A1c was elevated. Pt was started on Victoza and has done very well, losing 4lbs since last visit. Encouraged continued efforts at healthy diet and exercise. Plan to repeat A1c in 02/2013.

## 2013-01-13 ENCOUNTER — Ambulatory Visit (INDEPENDENT_AMBULATORY_CARE_PROVIDER_SITE_OTHER): Payer: Medicare Other | Admitting: Cardiovascular Disease

## 2013-01-13 ENCOUNTER — Encounter: Payer: Self-pay | Admitting: Cardiovascular Disease

## 2013-01-13 VITALS — BP 118/70 | HR 68 | Ht 72.0 in | Wt 250.5 lb

## 2013-01-13 DIAGNOSIS — I10 Essential (primary) hypertension: Secondary | ICD-10-CM

## 2013-01-13 DIAGNOSIS — I2510 Atherosclerotic heart disease of native coronary artery without angina pectoris: Secondary | ICD-10-CM

## 2013-01-13 DIAGNOSIS — E785 Hyperlipidemia, unspecified: Secondary | ICD-10-CM | POA: Diagnosis not present

## 2013-01-13 MED ORDER — ISOSORBIDE MONONITRATE ER 30 MG PO TB24: 30.0000 mg | ORAL_TABLET | Freq: Every day | ORAL | Status: DC

## 2013-01-13 NOTE — Patient Instructions (Addendum)
Take Norvasc 10mg  daily instead of twice daily.  Take Imdur 30mg  daily, this has been reduced from 60mg  daily, and sent electronically to your pharmacy.  Take spironolactone 25mg  daily at bedtime.   Please call the office if you are experiencing continued dizziness, elevated or low BP.   Follow-up in 6 months.

## 2013-01-13 NOTE — Progress Notes (Signed)
HPI This is a 67 year old male who is here today for followup visit. He has known history of coronary artery disease status post multiple PCI in the past at [facility]. He presented in April of this year with chest pain. He underwent cardiac catheterization which showed patent stents. However, there was a 90% stenosis in the proximal first diagonal. There was 40% disease in the mid LAD and moderate disease in the left circumflex. He underwent an angioplasty and drug-eluting stent placement to the diagonal without complications.  he has known history of refractory hypertension. Renal artery duplex ultrasound showed no evidence of renal artery stenosis. He presented to [facility] last year with chest pain at rest. He was hypertensive on presentation. He ruled out for myocardial infarction. He underwent a pharmacologic nuclear stress test which was normal. He had an echocardiogram done which showed normal LV systolic function, mild left ventricular hypertrophy and mild diastolic dysfunction. No significant valvular abnormalities.  Overall, he is feeling much better. He works out 3-4x/week without chest pain or activity-limiting dyspnea. He has lost 6 lbs. Last visit, he was having problems with elevated blood pressure. BP 118/70 on today's visit. This is consistent with home readings. He has been compliant with spironolactone. He has noted some dizziness. 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 He reports unchanged bilateral lower extremity edema which improves with elevation and in the morning.   Allergies  Allergen Reactions  . Codeine     Derivatives Nausea.  . Trazodone And Nefazodone Nausea Only     Current Outpatient Prescriptions on File Prior to Visit  Medication Sig Dispense Refill  . amLODipine (NORVASC) 10 MG tablet Take 1 tablet (10 mg total) by mouth daily.  180 tablet  3  . aspirin 81 MG tablet Take 243 mg by mouth  daily.       . carvedilol (COREG) 25 MG tablet Take 1 tablet (25 mg total) by mouth 2 (two) times daily.  60 tablet  11  . clopidogrel (PLAVIX) 75 MG tablet Take 1 tablet (75 mg total) by mouth daily.  90 tablet  3  . cyanocobalamin (,VITAMIN B-12,) 1000 MCG/ML injection Inject 1 mL (1,000 mcg total) into the muscle every 30 (thirty) days.  30 mL  3  . DULoxetine (CYMBALTA) 60 MG capsule Take 1 capsule (60 mg total) by mouth daily.  90 capsule  3  . glucose blood (FREESTYLE TEST STRIPS) test strip Use to check blood sugar up to three times daily  100 each  11  . HYDROcodone-acetaminophen (VICODIN) 5-500 MG per tablet Take 2 tablets by mouth every 8 (eight) hours as needed for pain.  90 tablet  2  . insulin aspart (NOVOLOG FLEXPEN) 100 UNIT/ML injection Inject 25 units before meals per sliding scale. +  3 vial  3  . LANTUS 100 UNIT/ML injection INJECT 80 UNITS            SUBCUTANEOUS TWICE DAILY  150 mL  3  . Liraglutide (VICTOZA) 18 MG/3ML SOLN Inject 0.3 mLs (1.8 mg total) into the skin daily.  27 mL  3  . lisinopril-hydrochlorothiazide (PRINZIDE,ZESTORETIC) 20-12.5 MG per tablet Take 1 tablet by mouth 2 (two) times daily.  180 tablet  4  . metFORMIN (GLUCOPHAGE) 1000 MG tablet Take 1 tablet (1,000 mg total) by mouth 2 (two) times daily.  180 tablet  3  . NITROGLYCERIN PO Take by mouth. Sublingual....specific dose is unknown       .  pantoprazole (PROTONIX) 40 MG tablet Take 1 tablet (40 mg total) by mouth 2 (two) times daily.  180 tablet  3  . pregabalin (LYRICA) 75 MG capsule Take 75 mg by mouth daily.      . rosuvastatin (CRESTOR) 10 MG tablet Take 10 mg by mouth daily.      Marland Kitchen spironolactone (ALDACTONE) 25 MG tablet Take 1 tablet (25 mg total) by mouth daily.  90 tablet  3  . tiZANidine (ZANAFLEX) 4 MG tablet Take 1 tablet (4 mg total) by mouth 3 (three) times daily as needed.  270 tablet  3   Current Facility-Administered Medications on File Prior to Visit  Medication Dose Route Frequency  Provider Last Rate Last Dose  . promethazine (PHENERGAN) injection 25 mg  25 mg Intramuscular Once Shelia Media, MD         Past Medical History  Diagnosis Date  . Shingles   . Diabetes mellitus   . Hyperlipidemia   . Hypertension   . Broken leg     left...s/p pins  . Lower leg pain     chronic ,left leg  . Acute angina   . Depression   . CAD (coronary artery disease)     s/p multiple PCI with stent RCA,LAD and obtuse marginal,followed @ [facility] on the accord study.., cath 02/2012 90% D1, s/p drug-eluting stent Dr. Kirke Corin  . Falls 09/2011  . Dizziness      Past Surgical History  Procedure Laterality Date  . Appendectomy    . Cholecystectomy    . Left leg surgery    . Left foot surgery    . Drainage port in left testicle    . Cardiac catheterization  02/2012     Family History  Problem Relation Age of Onset  . Cancer Mother     BREAST  . Heart disease Mother     STENT  . Cancer Father     THROAT  . Heart disease Sister     STENT; HTN  . Heart attack Brother   . Hypertension Brother   . Hypertension Sister      History   Social History  . Marital Status: Married    Spouse Name: N/A    Number of Children: N/A  . Years of Education: N/A   Occupational History  . Not on file.   Social History Main Topics  . Smoking status: Former Smoker    Quit date: 11/08/1971  . Smokeless tobacco: Never Used  . Alcohol Use: No  . Drug Use: No  . Sexually Active: Not on file   Other Topics Concern  . Not on file   Social History Narrative  . No narrative on file      PHYSICAL EXAM   BP 118/70  Ht 6' (1.829 m)  Wt 250 lb 8 oz (113.626 kg)  BMI 33.97 kg/m2 Constitutional: He is oriented to person, place, and time. He appears well-developed and well-nourished. No distress.  HENT: No nasal discharge.  Head: Normocephalic and atraumatic.  Eyes: Pupils are equal and round. Right eye exhibits no discharge. Left eye exhibits no discharge.  Neck: Normal  range of motion. Neck supple. No JVD present. No thyromegaly present.  Cardiovascular: Normal rate, regular rhythm, normal heart sounds and. Exam reveals no gallop and no friction rub. No murmur heard.  Pulmonary/Chest: Effort normal and breath sounds normal. No stridor. No respiratory distress. He has no wheezes. He has no rales. He exhibits no tenderness.  Abdominal: Soft.  Bowel sounds are normal. He exhibits no distension. There is no tenderness. There is no rebound and no guarding.  Musculoskeletal: Normal range of motion. There is trace-1+ bilateral LE edema. No tenderness.  Neurological: He is alert and oriented to person, place, and time. Coordination normal.  Skin: Skin is warm and dry. Bilateral LE hyperpigmentation noted. He is not diaphoretic. No pallor.  Psychiatric: He has a normal mood and affect. His behavior is normal. Judgment and thought content normal.    HPI Comments:    See above.  Review of Systems  Constitutional: Negative for fever and chills.  Respiratory: Negative for chest tightness and shortness of breath.   Gastrointestinal: Negative for nausea, vomiting and abdominal pain.  Skin: Positive for rash.  Neurological: Positive for dizziness and light-headedness. Negative for syncope.   Physical Exam  See above.   ASSESSMENT AND PLAN

## 2013-01-13 NOTE — Assessment & Plan Note (Addendum)
BP excellent today, however he is experiencing some dizziness at home which may be contributing to falls. For unclear reasons, he is taking Norvasc BID. Advised on daily dosing. Will reduce Imdur from 60mg  to 30mg  daily (continue to take in the morning prior to exercise for potential angina prophylaxis). Advised to take spironolactone at night to mitigate symptoms.

## 2013-01-13 NOTE — Assessment & Plan Note (Signed)
LDL very well-controlled on prior lipid panel < 1 year ago. Triglyceride elevated, but suspect numbers are just as good if not better with improved BG and weight loss. Continue Crestor.

## 2013-01-13 NOTE — Assessment & Plan Note (Addendum)
Exercising frequently without chest pain or limiting dyspnea. Continue current cardiac meds- ASA, ACEi, BB, spironolactone, Imdur, statin, NTG SL PRN. Praised on risk factor reduction.

## 2013-01-16 ENCOUNTER — Telehealth: Payer: Self-pay | Admitting: *Deleted

## 2013-01-16 ENCOUNTER — Other Ambulatory Visit: Payer: Self-pay | Admitting: Cardiovascular Disease

## 2013-01-16 ENCOUNTER — Other Ambulatory Visit: Payer: Self-pay

## 2013-01-16 ENCOUNTER — Other Ambulatory Visit: Payer: Self-pay | Admitting: *Deleted

## 2013-01-16 DIAGNOSIS — R55 Syncope and collapse: Secondary | ICD-10-CM

## 2013-01-16 LAB — BASIC METABOLIC PANEL
Anion Gap: 7 (ref 7–16)
Calcium, Total: 9.1 mg/dL (ref 8.5–10.1)
Co2: 27 mmol/L (ref 21–32)
Creatinine: 1.1 mg/dL (ref 0.60–1.30)
Glucose: 156 mg/dL — ABNORMAL HIGH (ref 65–99)
Potassium: 4.1 mmol/L (ref 3.5–5.1)
Sodium: 137 mmol/L (ref 136–145)

## 2013-01-16 NOTE — Telephone Encounter (Signed)
Pt s wife says pt exercised as usual on treadmill yesterday am He then worked off and on in workshop He came in at 1000 last night and ate a sandwich Says he sat down in chair and began to watch TV, stated "I feel really bad" [place] Spaniel he was going to stay in chair for a while  Wife heard pt get up and turn off TV and then heard him fall in hall She came to help him up. He was responsive and refused to have her call for help Denied associated CP or SOB with episode Checked BP with manual cuff and BP was 88 systolic  Today pt says he feels fine other than c/o h/a Denies dizziness BP this am=130/80 Confirms he is drinking plenty of fluids and made med changes per Dr. Jari Sportsman request at [facility] this week  I told wife I would discuss with Dr. Kirke Corin and call her back Understanding verb

## 2013-01-16 NOTE — Telephone Encounter (Signed)
Pt wife called stating that pt BP dropped last night and he got dizzy and fell. Wife doesn't know if he passed out or not. Pt didn't feel good

## 2013-01-16 NOTE — Telephone Encounter (Signed)
Pt's wife informed Understanding verb Says pt did not hit head when he fell and h/a has resolved Feels they can forego the CT scan for now Will stop amlodipine and will get STAT BMP at [facility] today

## 2013-01-16 NOTE — Telephone Encounter (Signed)
BMP results reviewed No signs of dehydration I called wife and explained this Says pt does not feel as bad as he did last night She will make sure pt stays well hydrated, will hold amlodipine and will have him change positions slowly They have our office # to call over w/e should she have any questions/concerns and need to speak to MD on call

## 2013-01-16 NOTE — Telephone Encounter (Signed)
If he is still having a headache, he needs CT head without contrast to make sure there is no bleed.  He needs stat BMP to see if dehydrated.  Stop Amlodipine for now.  If he is feeling bad, he should go to ER to get evaluated.

## 2013-01-28 ENCOUNTER — Telehealth: Payer: Self-pay | Admitting: Internal Medicine

## 2013-01-28 DIAGNOSIS — M25519 Pain in unspecified shoulder: Secondary | ICD-10-CM

## 2013-01-28 NOTE — Telephone Encounter (Addendum)
Spoke with patient wife, he has been going to a specialist in [place] to get cortisone injections. He is looking for someone closer, rather than going all the way there and paying all that money. They told him that he is not a candidate for surgery, all they could do is give him shots. Would like to know if someone in this office does it or could you refer him to someone?

## 2013-01-28 NOTE — Telephone Encounter (Signed)
No problem. We can set him up with Dr. Karleen Hampshire Copland in sports med at [facility]

## 2013-01-28 NOTE — Telephone Encounter (Signed)
Spinal injections? Please clarify

## 2013-01-28 NOTE — Telephone Encounter (Signed)
The patient is wanting to know if the physician will give him a cortisone shot in his shoulder.

## 2013-01-28 NOTE — Telephone Encounter (Signed)
I apologize, he has been getting injections in his shoulder.

## 2013-01-28 NOTE — Telephone Encounter (Signed)
Spoke with patient wife and they would like for him to be set up at [facility] asap

## 2013-01-29 ENCOUNTER — Ambulatory Visit (INDEPENDENT_AMBULATORY_CARE_PROVIDER_SITE_OTHER): Payer: Medicare Other | Admitting: *Deleted

## 2013-01-29 ENCOUNTER — Telehealth: Payer: Self-pay | Admitting: Emergency Medicine

## 2013-01-29 DIAGNOSIS — E538 Deficiency of other specified B group vitamins: Secondary | ICD-10-CM

## 2013-01-29 MED ORDER — DULOXETINE HCL 60 MG PO CPEP: 60.0000 mg | ORAL_CAPSULE | Freq: Every day | ORAL | Status: DC

## 2013-01-29 MED ORDER — CYANOCOBALAMIN 1000 MCG/ML IJ SOLN
1000.0000 ug | Freq: Once | INTRAMUSCULAR | Status: AC
  Administered 2013-01-29: 1000 ug via INTRAMUSCULAR

## 2013-01-29 NOTE — Telephone Encounter (Addendum)
I do not know why they will not fill it. Fine to call in new Rx., was faxed to [facility] in error so I called for them to cancel. Spoke with [place] and he will delete it.

## 2013-01-29 NOTE — Telephone Encounter (Signed)
Faxed to [facility]

## 2013-01-29 NOTE — Telephone Encounter (Signed)
The patients wife is wondering why [facility] will not refill the Cymbalta 60mg . Wondering if they can get a new script for the generic of this?

## 2013-02-02 ENCOUNTER — Ambulatory Visit (INDEPENDENT_AMBULATORY_CARE_PROVIDER_SITE_OTHER)
Admission: RE | Admit: 2013-02-02 | Discharge: 2013-02-02 | Disposition: A | Discharge status: Home / Self Care | Payer: Medicare Other | Source: Ambulatory Visit | Attending: Family Medicine | Admitting: Family Medicine

## 2013-02-02 ENCOUNTER — Encounter: Payer: Self-pay | Admitting: Family Medicine

## 2013-02-02 ENCOUNTER — Ambulatory Visit (INDEPENDENT_AMBULATORY_CARE_PROVIDER_SITE_OTHER): Payer: Medicare Other | Admitting: Family Medicine

## 2013-02-02 VITALS — BP 110/60 | HR 80 | Temp 97.4°F | Ht 72.0 in | Wt 248.0 lb

## 2013-02-02 DIAGNOSIS — M19019 Primary osteoarthritis, unspecified shoulder: Secondary | ICD-10-CM | POA: Diagnosis not present

## 2013-02-02 DIAGNOSIS — M19011 Primary osteoarthritis, right shoulder: Secondary | ICD-10-CM

## 2013-02-02 DIAGNOSIS — M25519 Pain in unspecified shoulder: Secondary | ICD-10-CM | POA: Diagnosis not present

## 2013-02-02 DIAGNOSIS — M25511 Pain in right shoulder: Secondary | ICD-10-CM

## 2013-02-02 NOTE — Patient Instructions (Addendum)
OSTEOARTHRITIS:  For symptomatic relief: Tylenol: 2 tablets up to 3-4 times a day  Topical Capzaicin Cream, as needed (wear glove to put on)  For flares, corticosteroid injections help. Hyaluronic Acid injections have good success, average relief is 6 months  Glucosamine and Chondroitin often helpful - will take about 3 months to see if you have an effect. If you do, great, keep them up, if none at that point, no need to take in the future.  Omega-3 fish oils may help, 2 grams daily  Ice joints on bad days, 20 min, 2-3 x / day REGULAR EXERCISE: swimming, Yoga, Tai Chi, bicycle (NON-IMPACT activity)  

## 2013-02-02 NOTE — Progress Notes (Signed)
Date:  02/02/2013   Name:  Steve Phillips   DOB:  Apr 22, 1946   MRN:  [id number] Gender: male Age: 67 y.o.  Primary Physician: Ronna Polio, MD  Dear Dr. Dan Humphreys,  Thank you for having me see Steve Phillips in consultation today at [facility] at [facility] for his problem with RIGHT shoulder pain.  As you may recall, he is a 67 y.o. year old male with a history of intermittent right-sided shoulder pain and osteoarthritis for some time. He previously has been seen by Dr. Malka So at [facility] and formerly of [facility], who is a highly respected orthopedic surgeon in the [place] with extensive shoulder surgical experience. His notes were reviewed.   History is significant for a prior shoulder arthroscopy, and what sounds to be a subacromial decompression with potential distal clavicle excision. He is not 100% certain of which shoulder was operated on by Dr. Julieanne Cotton, former head of [facility]. Skin changes are so extensive that cannot tell which side has scope portals.   He has pain with his shoulder much of the time. He does have significant crepitus. Is not ever had a dislocation event. He also is on Plavix. He has very significant coronary disease with multiple stents in the past, and he also has insulin-dependent diabetes.  At this point, he is essentially limited by pain and some of his range of motion.  Past Medical History  Diagnosis Date  . Shingles   . Diabetes mellitus   . Hyperlipidemia   . Hypertension   . Broken leg     left...s/p pins  . Lower leg pain     chronic ,left leg  . Acute angina   . Depression   . CAD (coronary artery disease)     s/p multiple PCI with stent RCA,LAD and obtuse marginal,followed @ [facility] on the accord study.., cath 02/2012 90% D1, s/p drug-eluting stent Dr. Kirke Corin  . Falls 09/2011  . Dizziness     Past Surgical History  Procedure Laterality Date  . Appendectomy    . Cholecystectomy    . Left leg surgery    . Left foot surgery      . Drainage port in left testicle    . Cardiac catheterization  02/2012    History   Social History  . Marital Status: Married    Spouse Name: N/A    Number of Children: N/A  . Years of Education: N/A   Social History Main Topics  . Smoking status: Former Smoker    Quit date: 11/08/1971  . Smokeless tobacco: Never Used  . Alcohol Use: No  . Drug Use: No  . Sexually Active: None   Other Topics Concern  . None   Social History Narrative  . None    Family History  Problem Relation Age of Onset  . Cancer Mother     BREAST  . Heart disease Mother     STENT  . Cancer Father     THROAT  . Heart disease Sister     STENT; HTN  . Heart attack Brother   . Hypertension Brother   . Hypertension Sister     Medications and Allergies reviewed  Outpatient Prescriptions Prior to Visit  Medication Sig Dispense Refill  . aspirin 81 MG tablet Take 243 mg by mouth daily.       . carvedilol (COREG) 25 MG tablet Take 1 tablet (25 mg total) by mouth 2 (two) times daily.  60 tablet  11  . clopidogrel (PLAVIX) 75 MG tablet Take 1 tablet (75 mg total) by mouth daily.  90 tablet  3  . cyanocobalamin (,VITAMIN B-12,) 1000 MCG/ML injection Inject 1 mL (1,000 mcg total) into the muscle every 30 (thirty) days.  30 mL  3  . DULoxetine (CYMBALTA) 60 MG capsule Take 1 capsule (60 mg total) by mouth daily.  90 capsule  3  . glucose blood (FREESTYLE TEST STRIPS) test strip Use to check blood sugar up to three times daily  100 each  11  . HYDROcodone-acetaminophen (VICODIN) 5-500 MG per tablet Take 2 tablets by mouth every 8 (eight) hours as needed for pain.  90 tablet  2  . insulin aspart (NOVOLOG FLEXPEN) 100 UNIT/ML injection Inject 25 units before meals per sliding scale. +  3 vial  3  . isosorbide mononitrate (IMDUR) 30 MG 24 hr tablet Take 1 tablet (30 mg total) by mouth daily.  30 tablet  6  . LANTUS 100 UNIT/ML injection INJECT 80 UNITS            SUBCUTANEOUS TWICE DAILY  150 mL  3  .  Liraglutide (VICTOZA) 18 MG/3ML SOLN Inject 0.3 mLs (1.8 mg total) into the skin daily.  27 mL  3  . lisinopril-hydrochlorothiazide (PRINZIDE,ZESTORETIC) 20-12.5 MG per tablet Take 1 tablet by mouth 2 (two) times daily.  180 tablet  4  . metFORMIN (GLUCOPHAGE) 1000 MG tablet Take 1 tablet (1,000 mg total) by mouth 2 (two) times daily.  180 tablet  3  . NITROGLYCERIN PO Take by mouth. Sublingual....specific dose is unknown       . pantoprazole (PROTONIX) 40 MG tablet Take 1 tablet (40 mg total) by mouth 2 (two) times daily.  180 tablet  3  . pregabalin (LYRICA) 75 MG capsule Take 75 mg by mouth daily.      . rosuvastatin (CRESTOR) 10 MG tablet Take 10 mg by mouth daily.      Marland Kitchen spironolactone (ALDACTONE) 25 MG tablet Take 1 tablet (25 mg total) by mouth daily.  90 tablet  3  . tiZANidine (ZANAFLEX) 4 MG tablet Take 1 tablet (4 mg total) by mouth 3 (three) times daily as needed.  270 tablet  3  . amLODipine (NORVASC) 10 MG tablet Take 1 tablet (10 mg total) by mouth daily.  180 tablet  3   Facility-Administered Medications Prior to Visit  Medication Dose Route Frequency Provider Last Rate Last Dose  . promethazine (PHENERGAN) injection 25 mg  25 mg Intramuscular Once Shelia Media, MD        Review of Systems:   No chest pain, no shortness of breath. Shoulder pain. Blood sugars are currently under poor control. No neck pain. The radicular pain.  Physical Examination: Filed Vitals:   02/02/13 1415  BP: 110/60  Pulse: 80  Temp: 97.4 F (36.3 C)  TempSrc: Oral  Height: 6' (1.829 m)  Weight: 248 lb (112.492 kg)      GEN: WDWN, NAD, Non-toxic, Alert & Oriented x 3 HEENT: Atraumatic, Normocephalic.  Ears and Nose: No external deformity. EXTR: No clubbing/cyanosis/edema NEURO: Normal gait.  PSYCH: Normally interactive. Conversant. Not depressed or anxious appearing.  Calm demeanor.   Shoulder: R Inspection: No muscle wasting or winging Ecchymosis/edema: neg  AC joint, scapula,  clavicle: mild ac joint tenderness Cervical spine: NT, full ROM Spurling's: neg Abduction: to 140, 5/5 Flexion: to 145, 5/5 IR, full, lift-off: 5/5 ER at neutral: full, 5/5 AC crossover and compression:  POS Neer: POS Hawkins: POS Drop Test: neg Empty Can: neg Supraspinatus insertion: NT Bicipital groove: NT Speed's: POS Yergason's: neg Sulcus sign: neg Scapular dyskinesis: none C5-T1 intact Sensation intact Grip 5/5  Dg Shoulder Right  02/02/2013  *RADIOLOGY REPORT*  Clinical Data: Shoulder pain.  RIGHT SHOULDER - 2+ VIEW  Comparison: None.  Findings: The humerus is located and the acromioclavicular joint is intact.  The patient has a moderate to advanced acromioclavicular degenerative change and mild to moderate glenohumeral degenerative disease.  Imaged right lung is clear.  There appears to be remote right fifth rib fracture.  IMPRESSION:  1.  No acute finding. 2.  Acromioclavicular and glenohumeral osteoarthritis.   Original Report Authenticated By: Holley Dexter, M.D.     Independent review: Moderate degree of glenohumeral arthritis and agree with moderate to advanced acromioclavicular joint osteoarthritic changes. There's no evidence for occult fracture. No dislocation. Likely old rib fracture is noted. Steve Beat MD  Assessment and Plan:  Impression:  Osteoarthritis of glenohumeral joint, right  Right shoulder pain - Plan: DG Shoulder Right  Osteoarthritis of acromioclavicular joint   Recommendations: challenging in a patient with severe coronary disease and poorly controlled diabetes mellitus on Plavix. Recommended p.r.n. Tylenol, regular exercise, including using the pool, and the patient and his wife state that he does have some access to this. Also reviewed some range of motion and shoulder exercises that he can do outside of the pool as well. Also suggested capsaicin cream.  Celebrex could also be used in conjunction with Plavix, but all other NSAIDs have a  relative contraindication. Intermittent intra-articular corticosteroid and ejection could be reasonable to continue with. They asked about hyaluronic acid, but this is not FDA approved for glenohumeral arthritis, so this would be cash pay service with an estimated cost of 1000-1500 dollars.  GH OA is not at the level where total shoulder arthroplasty would be in consideration.  Intrarticular Shoulder Injection, RIGHT Verbal consent was obtained from the patient. Risks including infection explained and contrasted with benefits and alternatives. Patient prepped with Chloraprep and Ethyl Chloride used for anesthesia. An intraarticular shoulder injection was performed using the posterior approach. The patient tolerated the procedure well and had decreased pain post injection. No complications. Injection: 8 cc of Lidocaine 1% and 2 cc of Depo-Medrol 40 mg. Needle: 22 gauge  We will see the patient back prn when his shoulder starts to bother him again.  Thank you for having [place] see Steve Phillips in consultation.  Feel free to contact me with any questions.  Signed, Elpidio Galea. Cheyeanne Roadcap, MD, CAQ Sports Medicine [facility] [address] [place], [place] [address] Phone: [phone] Fax: [phone]

## 2013-02-23 ENCOUNTER — Ambulatory Visit (INDEPENDENT_AMBULATORY_CARE_PROVIDER_SITE_OTHER): Payer: Medicare Other | Admitting: Internal Medicine

## 2013-02-23 ENCOUNTER — Encounter: Payer: Self-pay | Admitting: Internal Medicine

## 2013-02-23 VITALS — BP 140/90 | HR 79 | Temp 97.5°F | Wt 242.0 lb

## 2013-02-23 DIAGNOSIS — I10 Essential (primary) hypertension: Secondary | ICD-10-CM

## 2013-02-23 DIAGNOSIS — E1142 Type 2 diabetes mellitus with diabetic polyneuropathy: Secondary | ICD-10-CM | POA: Diagnosis not present

## 2013-02-23 DIAGNOSIS — E119 Type 2 diabetes mellitus without complications: Secondary | ICD-10-CM | POA: Diagnosis not present

## 2013-02-23 DIAGNOSIS — M19019 Primary osteoarthritis, unspecified shoulder: Secondary | ICD-10-CM | POA: Insufficient documentation

## 2013-02-23 DIAGNOSIS — E1149 Type 2 diabetes mellitus with other diabetic neurological complication: Secondary | ICD-10-CM

## 2013-02-23 MED ORDER — PREGABALIN 75 MG PO CAPS: 75.0000 mg | ORAL_CAPSULE | Freq: Two times a day (BID) | ORAL | Status: DC

## 2013-02-23 MED ORDER — ISOSORBIDE MONONITRATE ER 30 MG PO TB24: 30.0000 mg | ORAL_TABLET | Freq: Every day | ORAL | Status: DC

## 2013-02-23 NOTE — Progress Notes (Signed)
Subjective:    Patient ID: Steve Phillips, male    DOB: [birth year], 67 y.o.   MRN: [id number]  HPI 67 year old male with history of diabetes, hypertension, coronary artery disease, chronic right shoulder pain presents for followup. In regards to diabetes, he reports blood sugars have recently been elevated, often greater than 300 after having steroid injection in his right shoulder. He reports compliance with his medications. He has been trying to follow a healthy diet and has been exercising at a local gym. He has lost between 5 and 10 pounds based on his measurements.  Number carts to chronic right shoulder pain, he reports no improvement with recent steroid injection. He describes aching pain that radiates down the front of his right shoulder with minimal movement. This is improved slightly with use of Vicodin.  In regards to hypertension, his wife reports that blood pressures have been often lower, less than 120/80 at home. In some occasions he has been lightheaded with lower blood pressure. They note that his cardiologist recently stopped amlodipine. No recent chest pain, headache, palpitations.  Outpatient Encounter Prescriptions as of 02/23/2013  Medication Sig Dispense Refill  . aspirin 81 MG tablet Take 243 mg by mouth daily.       . carvedilol (COREG) 25 MG tablet Take 1 tablet (25 mg total) by mouth 2 (two) times daily.  60 tablet  11  . clopidogrel (PLAVIX) 75 MG tablet Take 1 tablet (75 mg total) by mouth daily.  90 tablet  3  . cyanocobalamin (,VITAMIN B-12,) 1000 MCG/ML injection Inject 1 mL (1,000 mcg total) into the muscle every 30 (thirty) days.  30 mL  3  . DULoxetine (CYMBALTA) 60 MG capsule Take 1 capsule (60 mg total) by mouth daily.  90 capsule  3  . glucose blood (FREESTYLE TEST STRIPS) test strip Use to check blood sugar up to three times daily  100 each  11  . HYDROcodone-acetaminophen (VICODIN) 5-500 MG per tablet Take 2 tablets by mouth every 8 (eight) hours as needed for  pain.  90 tablet  2  . insulin aspart (NOVOLOG FLEXPEN) 100 UNIT/ML injection Inject 25 units before meals per sliding scale. +  3 vial  3  . isosorbide mononitrate (IMDUR) 30 MG 24 hr tablet Take 1 tablet (30 mg total) by mouth daily.  90 tablet  4  . LANTUS 100 UNIT/ML injection INJECT 80 UNITS            SUBCUTANEOUS TWICE DAILY  150 mL  3  . Liraglutide (VICTOZA) 18 MG/3ML SOLN Inject 0.3 mLs (1.8 mg total) into the skin daily.  27 mL  3  . lisinopril-hydrochlorothiazide (PRINZIDE,ZESTORETIC) 20-12.5 MG per tablet Take 1 tablet by mouth 2 (two) times daily.  180 tablet  4  . metFORMIN (GLUCOPHAGE) 1000 MG tablet Take 1 tablet (1,000 mg total) by mouth 2 (two) times daily.  180 tablet  3  . NITROGLYCERIN PO Take by mouth. Sublingual....specific dose is unknown       . pantoprazole (PROTONIX) 40 MG tablet Take 1 tablet (40 mg total) by mouth 2 (two) times daily.  180 tablet  3  . pregabalin (LYRICA) 75 MG capsule Take 1 capsule (75 mg total) by mouth 2 (two) times daily.  180 capsule  4  . rosuvastatin (CRESTOR) 10 MG tablet Take 10 mg by mouth daily.      Marland Kitchen spironolactone (ALDACTONE) 25 MG tablet Take 1 tablet (25 mg total) by mouth daily.  90 tablet  3  . tiZANidine (ZANAFLEX) 4 MG tablet Take 1 tablet (4 mg total) by mouth 3 (three) times daily as needed.  270 tablet  3  . [DISCONTINUED] isosorbide mononitrate (IMDUR) 30 MG 24 hr tablet Take 1 tablet (30 mg total) by mouth daily.  30 tablet  6  . [DISCONTINUED] pregabalin (LYRICA) 75 MG capsule Take 75 mg by mouth daily.        BP 140/90  Pulse 79  Temp(Src) 97.5 F (36.4 C) (Oral)  Wt 242 lb (109.77 kg)  BMI 32.81 kg/m2  SpO2 97%  Review of Systems  Constitutional: Negative for fever, chills, activity change, appetite change, fatigue and unexpected weight change.  Eyes: Negative for visual disturbance.  Respiratory: Negative for cough and shortness of breath.   Cardiovascular: Negative for chest pain, palpitations and leg swelling.   Gastrointestinal: Negative for abdominal pain and abdominal distention.  Genitourinary: Negative for dysuria, urgency and difficulty urinating.  Musculoskeletal: Positive for arthralgias. Negative for gait problem.  Skin: Negative for color change and rash.  Hematological: Negative for adenopathy.  Psychiatric/Behavioral: Negative for sleep disturbance and dysphoric mood. The patient is not nervous/anxious.        Objective:   Physical Exam  Constitutional: He is oriented to person, place, and time. He appears well-developed and well-nourished. No distress.  HENT:  Head: Normocephalic and atraumatic.  Right Ear: External ear normal.  Left Ear: External ear normal.  Nose: Nose normal.  Mouth/Throat: Oropharynx is clear and moist. No oropharyngeal exudate.  Eyes: Conjunctivae and EOM are normal. Pupils are equal, round, and reactive to light. Right eye exhibits no discharge. Left eye exhibits no discharge. No scleral icterus.  Neck: Normal range of motion. Neck supple. No tracheal deviation present. No thyromegaly present.  Cardiovascular: Normal rate, regular rhythm and normal heart sounds.  Exam reveals no gallop and no friction rub.   No murmur heard. Pulmonary/Chest: Effort normal and breath sounds normal. No respiratory distress. He has no wheezes. He has no rales. He exhibits no tenderness.  Musculoskeletal: He exhibits no edema.       Right shoulder: He exhibits decreased range of motion, tenderness, bony tenderness and pain.  Lymphadenopathy:    He has no cervical adenopathy.  Neurological: He is alert and oriented to person, place, and time. No cranial nerve deficit. Coordination normal.  Skin: Skin is warm and dry. No rash noted. He is not diaphoretic. No erythema. No pallor.  Psychiatric: He has a normal mood and affect. His behavior is normal. Judgment and thought content normal.          Assessment & Plan:

## 2013-02-23 NOTE — Assessment & Plan Note (Signed)
Osteoarthritis of the right shoulder. No improvement after recent steroid injection. Will set up orthopedic evaluation. Question if patient might benefit from shoulder replacement.

## 2013-02-23 NOTE — Assessment & Plan Note (Signed)
BP Readings from Last 3 Encounters:  02/23/13 140/90  02/02/13 110/60  01/13/13 118/70   Blood pressure slightly elevated today but has been well-controlled at home. We'll continue to monitor.

## 2013-02-23 NOTE — Assessment & Plan Note (Signed)
Will check A1c with labs today. Continue current medications. Encourage continued efforts at healthy diet and regular physical activity. Followup in 3 months.

## 2013-02-24 ENCOUNTER — Telehealth: Payer: Self-pay | Admitting: *Deleted

## 2013-02-24 LAB — COMPREHENSIVE METABOLIC PANEL
AST: 25 U/L (ref 0–37)
Albumin: 3.8 g/dL (ref 3.5–5.2)
BUN: 12 mg/dL (ref 6–23)
CO2: 26 mEq/L (ref 19–32)
Calcium: 9.2 mg/dL (ref 8.4–10.5)
Chloride: 100 mEq/L (ref 96–112)
Creatinine, Ser: 0.9 mg/dL (ref 0.4–1.5)
GFR: 91.36 mL/min (ref >60.00)
Glucose, Bld: 278 mg/dL — ABNORMAL HIGH (ref 70–99)
Potassium: 4.6 mEq/L (ref 3.5–5.1)

## 2013-02-24 LAB — LDL CHOLESTEROL, DIRECT: Direct LDL: 46 mg/dL

## 2013-02-24 LAB — LIPID PANEL: Cholesterol: 104 mg/dL (ref 0–200)

## 2013-02-24 LAB — HEMOGLOBIN A1C: Hgb A1c MFr Bld: 10.4 % — ABNORMAL HIGH (ref 4.6–6.5)

## 2013-02-24 NOTE — Telephone Encounter (Signed)
Pharmacy Note:  Vicodin 5-500 strenght is not available- Can we change to Vicodin 5-300 or Norco 5-325

## 2013-02-24 NOTE — Telephone Encounter (Signed)
Will this be ok? 

## 2013-02-24 NOTE — Telephone Encounter (Signed)
We can fill Norco 5-325mg  po tid prn #90 no refill. This should be enough until he is seen by orthopedics.

## 2013-02-25 ENCOUNTER — Telehealth: Payer: Self-pay | Admitting: Internal Medicine

## 2013-02-25 ENCOUNTER — Inpatient Hospital Stay: Payer: Self-pay | Admitting: Internal Medicine

## 2013-02-25 DIAGNOSIS — Z79899 Other long term (current) drug therapy: Secondary | ICD-10-CM | POA: Diagnosis not present

## 2013-02-25 DIAGNOSIS — Z794 Long term (current) use of insulin: Secondary | ICD-10-CM | POA: Diagnosis not present

## 2013-02-25 DIAGNOSIS — IMO0001 Reserved for inherently not codable concepts without codable children: Secondary | ICD-10-CM | POA: Diagnosis not present

## 2013-02-25 DIAGNOSIS — Z803 Family history of malignant neoplasm of breast: Secondary | ICD-10-CM | POA: Diagnosis not present

## 2013-02-25 DIAGNOSIS — E860 Dehydration: Secondary | ICD-10-CM | POA: Diagnosis present

## 2013-02-25 DIAGNOSIS — Z7982 Long term (current) use of aspirin: Secondary | ICD-10-CM | POA: Diagnosis not present

## 2013-02-25 DIAGNOSIS — I959 Hypotension, unspecified: Secondary | ICD-10-CM | POA: Diagnosis not present

## 2013-02-25 DIAGNOSIS — A059 Bacterial foodborne intoxication, unspecified: Secondary | ICD-10-CM | POA: Diagnosis present

## 2013-02-25 DIAGNOSIS — R6889 Other general symptoms and signs: Secondary | ICD-10-CM | POA: Diagnosis not present

## 2013-02-25 DIAGNOSIS — R197 Diarrhea, unspecified: Secondary | ICD-10-CM | POA: Diagnosis not present

## 2013-02-25 DIAGNOSIS — Z9861 Coronary angioplasty status: Secondary | ICD-10-CM | POA: Diagnosis not present

## 2013-02-25 DIAGNOSIS — Z888 Allergy status to other drugs, medicaments and biological substances status: Secondary | ICD-10-CM | POA: Diagnosis not present

## 2013-02-25 DIAGNOSIS — K5289 Other specified noninfective gastroenteritis and colitis: Secondary | ICD-10-CM | POA: Diagnosis present

## 2013-02-25 DIAGNOSIS — E1149 Type 2 diabetes mellitus with other diabetic neurological complication: Secondary | ICD-10-CM | POA: Diagnosis present

## 2013-02-25 DIAGNOSIS — K219 Gastro-esophageal reflux disease without esophagitis: Secondary | ICD-10-CM | POA: Diagnosis present

## 2013-02-25 DIAGNOSIS — N289 Disorder of kidney and ureter, unspecified: Secondary | ICD-10-CM | POA: Diagnosis not present

## 2013-02-25 DIAGNOSIS — N179 Acute kidney failure, unspecified: Secondary | ICD-10-CM | POA: Diagnosis not present

## 2013-02-25 DIAGNOSIS — A09 Infectious gastroenteritis and colitis, unspecified: Secondary | ICD-10-CM | POA: Diagnosis not present

## 2013-02-25 DIAGNOSIS — I2510 Atherosclerotic heart disease of native coronary artery without angina pectoris: Secondary | ICD-10-CM | POA: Diagnosis present

## 2013-02-25 DIAGNOSIS — E1142 Type 2 diabetes mellitus with diabetic polyneuropathy: Secondary | ICD-10-CM | POA: Diagnosis not present

## 2013-02-25 DIAGNOSIS — F329 Major depressive disorder, single episode, unspecified: Secondary | ICD-10-CM | POA: Diagnosis present

## 2013-02-25 DIAGNOSIS — E869 Volume depletion, unspecified: Secondary | ICD-10-CM | POA: Diagnosis not present

## 2013-02-25 DIAGNOSIS — Z7902 Long term (current) use of antithrombotics/antiplatelets: Secondary | ICD-10-CM | POA: Diagnosis not present

## 2013-02-25 DIAGNOSIS — I10 Essential (primary) hypertension: Secondary | ICD-10-CM | POA: Diagnosis present

## 2013-02-25 DIAGNOSIS — Z885 Allergy status to narcotic agent status: Secondary | ICD-10-CM | POA: Diagnosis not present

## 2013-02-25 DIAGNOSIS — Z800 Family history of malignant neoplasm of digestive organs: Secondary | ICD-10-CM | POA: Diagnosis not present

## 2013-02-25 DIAGNOSIS — R6510 Systemic inflammatory response syndrome (SIRS) of non-infectious origin without acute organ dysfunction: Secondary | ICD-10-CM | POA: Diagnosis not present

## 2013-02-25 DIAGNOSIS — R112 Nausea with vomiting, unspecified: Secondary | ICD-10-CM | POA: Diagnosis not present

## 2013-02-25 DIAGNOSIS — R9431 Abnormal electrocardiogram [ECG] [EKG]: Secondary | ICD-10-CM | POA: Diagnosis not present

## 2013-02-25 DIAGNOSIS — F411 Generalized anxiety disorder: Secondary | ICD-10-CM | POA: Diagnosis present

## 2013-02-25 DIAGNOSIS — A419 Sepsis, unspecified organism: Secondary | ICD-10-CM | POA: Diagnosis present

## 2013-02-25 DIAGNOSIS — Z9089 Acquired absence of other organs: Secondary | ICD-10-CM | POA: Diagnosis not present

## 2013-02-25 LAB — CBC
HCT: 42.7 % (ref 40.0–52.0)
HGB: 14.6 g/dL (ref 13.0–18.0)
MCH: 27.6 pg (ref 26.0–34.0)
Platelet: 202 10*3/uL (ref 150–440)
RDW: 14.9 % — ABNORMAL HIGH (ref 11.5–14.5)

## 2013-02-25 LAB — COMPREHENSIVE METABOLIC PANEL
Albumin: 3.1 g/dL — ABNORMAL LOW (ref 3.4–5.0)
Anion Gap: 9 (ref 7–16)
BUN: 22 mg/dL — ABNORMAL HIGH (ref 7–18)
Bilirubin,Total: 0.5 mg/dL (ref 0.2–1.0)
Calcium, Total: 7.7 mg/dL — ABNORMAL LOW (ref 8.5–10.1)
Co2: 23 mmol/L (ref 21–32)
Creatinine: 1.38 mg/dL — ABNORMAL HIGH (ref 0.60–1.30)
EGFR (African American): >60
EGFR (Non-African Amer.): 52 — ABNORMAL LOW
Glucose: 287 mg/dL — ABNORMAL HIGH (ref 65–99)
SGOT(AST): 16 U/L (ref 15–37)
Sodium: 136 mmol/L (ref 136–145)
Total Protein: 5.7 g/dL — ABNORMAL LOW (ref 6.4–8.2)

## 2013-02-25 LAB — URINALYSIS, COMPLETE
Bacteria: NONE SEEN
Blood: NEGATIVE
Glucose,UR: >=500 mg/dL (ref 0–75)
Hyaline Cast: 14
Ketone: NEGATIVE
Leukocyte Esterase: NEGATIVE
Nitrite: NEGATIVE
Protein: 30
RBC,UR: <1 /HPF (ref 0–5)

## 2013-02-25 LAB — PROTIME-INR
INR: 1.1
Prothrombin Time: 14.3 secs (ref 11.5–14.7)

## 2013-02-25 LAB — CK TOTAL AND CKMB (NOT AT ARMC)
CK, Total: 43 U/L (ref 35–232)
CK-MB: 1.2 ng/mL (ref 0.5–3.6)
CK-MB: 1.2 ng/mL (ref 0.5–3.6)

## 2013-02-25 LAB — TROPONIN I
Troponin-I: <0.02 ng/mL
Troponin-I: <0.02 ng/mL

## 2013-02-25 LAB — LIPASE, BLOOD: Lipase: 116 U/L (ref 73–393)

## 2013-02-25 NOTE — Telephone Encounter (Signed)
SPOUSE CALLED STATING Steve Phillips IS IN [facility]  WENT BY EMS LAST NIGHT.  HIS BP WAS 61/36. SHE ALSO WANTED TO LET YOU KNOW THAT HIS RX FOR LYERCIA WENT TO [id number] AND IT WAS SUPPOSED TO GO TO [facility]. PLEASE RE SEND.

## 2013-02-26 ENCOUNTER — Other Ambulatory Visit: Payer: Self-pay | Admitting: *Deleted

## 2013-02-26 DIAGNOSIS — R9431 Abnormal electrocardiogram [ECG] [EKG]: Secondary | ICD-10-CM

## 2013-02-26 DIAGNOSIS — R51 Headache: Secondary | ICD-10-CM

## 2013-02-26 LAB — BASIC METABOLIC PANEL
BUN: 15 mg/dL (ref 7–18)
Co2: 28 mmol/L (ref 21–32)
Creatinine: 1 mg/dL (ref 0.60–1.30)
EGFR (African American): >60
EGFR (Non-African Amer.): >60
Glucose: 176 mg/dL — ABNORMAL HIGH (ref 65–99)
Potassium: 3.9 mmol/L (ref 3.5–5.1)
Sodium: 143 mmol/L (ref 136–145)

## 2013-02-26 LAB — CBC WITH DIFFERENTIAL/PLATELET
Basophil #: 0 10*3/uL (ref 0.0–0.1)
Basophil %: 0.3 %
Eosinophil #: 0.5 10*3/uL (ref 0.0–0.7)
HCT: 41.2 % (ref 40.0–52.0)
Lymphocyte #: 2.3 10*3/uL (ref 1.0–3.6)
Lymphocyte %: 22.9 %
MCH: 27.6 pg (ref 26.0–34.0)
MCHC: 34.3 g/dL (ref 32.0–36.0)
Neutrophil %: 65.4 %
Platelet: 183 10*3/uL (ref 150–440)
RDW: 15 % — ABNORMAL HIGH (ref 11.5–14.5)

## 2013-02-26 LAB — MAGNESIUM: Magnesium: 1.7 mg/dL — ABNORMAL LOW

## 2013-02-26 MED ORDER — HYDROCODONE-ACETAMINOPHEN 5-325 MG PO TABS: 1.0000 | ORAL_TABLET | Freq: Three times a day (TID) | ORAL | Status: DC | PRN

## 2013-02-26 MED ORDER — HYDROCODONE-ACETAMINOPHEN 5-500 MG PO TABS: 2.0000 | ORAL_TABLET | Freq: Three times a day (TID) | ORAL | Status: DC | PRN

## 2013-02-26 NOTE — Telephone Encounter (Signed)
Medication was printed as 5/500 but corrected it to the 5/325 and reprinted to be signed and faxed to pharmacy

## 2013-02-26 NOTE — Telephone Encounter (Signed)
Refill Request  Pharmacy Note:  Vicodin 5-500 mg tab, strength is not available, is 5-325 mg ok?

## 2013-02-26 NOTE — Addendum Note (Signed)
Addended by: Theola Sequin on: 02/26/2013 12:48 PM   Modules accepted: Orders

## 2013-02-27 ENCOUNTER — Telehealth: Payer: Self-pay | Admitting: Internal Medicine

## 2013-02-27 DIAGNOSIS — M25519 Pain in unspecified shoulder: Secondary | ICD-10-CM | POA: Diagnosis not present

## 2013-02-27 LAB — URINE CULTURE

## 2013-02-27 NOTE — Telephone Encounter (Signed)
I have tried to call patient several times but can not get through on either number.

## 2013-02-27 NOTE — Telephone Encounter (Signed)
This has already been handled and patient wife is aware of this

## 2013-02-27 NOTE — Telephone Encounter (Signed)
Spoke with patient wife, he is doing better and his BP, has appointment scheduled for 4/24

## 2013-02-27 NOTE — Telephone Encounter (Signed)
Rx was resent and patient was discharge 4/10

## 2013-02-27 NOTE — Telephone Encounter (Signed)
Prescription sent to pharmacy and spoke with patient wife she is aware of this.

## 2013-02-27 NOTE — Telephone Encounter (Signed)
Patient has been discharged and has an upcoming appointment with Dr. Dan Humphreys on 4/24. Lyrica has been faxed to the correct pharmacy.

## 2013-02-27 NOTE — Telephone Encounter (Signed)
Patient is needing the prescription for Lyrica called into [facility]  The patient's wife was also informed that his Vicodin only comes in 325 mg not 500 mg

## 2013-02-28 LAB — STOOL CULTURE

## 2013-03-03 LAB — CULTURE, BLOOD (SINGLE)

## 2013-03-09 ENCOUNTER — Ambulatory Visit: Payer: Self-pay | Admitting: Orthopedic Surgery

## 2013-03-09 DIAGNOSIS — S43439A Superior glenoid labrum lesion of unspecified shoulder, initial encounter: Secondary | ICD-10-CM | POA: Diagnosis not present

## 2013-03-09 DIAGNOSIS — S46919A Strain of unspecified muscle, fascia and tendon at shoulder and upper arm level, unspecified arm, initial encounter: Secondary | ICD-10-CM | POA: Diagnosis not present

## 2013-03-11 DIAGNOSIS — M542 Cervicalgia: Secondary | ICD-10-CM | POA: Diagnosis not present

## 2013-03-11 DIAGNOSIS — M75120 Complete rotator cuff tear or rupture of unspecified shoulder, not specified as traumatic: Secondary | ICD-10-CM | POA: Diagnosis not present

## 2013-03-12 ENCOUNTER — Ambulatory Visit (INDEPENDENT_AMBULATORY_CARE_PROVIDER_SITE_OTHER): Payer: Medicare Other | Admitting: Internal Medicine

## 2013-03-12 ENCOUNTER — Telehealth: Payer: Self-pay | Admitting: *Deleted

## 2013-03-12 ENCOUNTER — Encounter: Payer: Self-pay | Admitting: Internal Medicine

## 2013-03-12 VITALS — BP 104/76 | HR 83 | Temp 97.8°F

## 2013-03-12 DIAGNOSIS — Z09 Encounter for follow-up examination after completed treatment for conditions other than malignant neoplasm: Secondary | ICD-10-CM | POA: Diagnosis not present

## 2013-03-12 DIAGNOSIS — I10 Essential (primary) hypertension: Secondary | ICD-10-CM

## 2013-03-12 DIAGNOSIS — M19019 Primary osteoarthritis, unspecified shoulder: Secondary | ICD-10-CM

## 2013-03-12 DIAGNOSIS — E1149 Type 2 diabetes mellitus with other diabetic neurological complication: Secondary | ICD-10-CM

## 2013-03-12 DIAGNOSIS — E1142 Type 2 diabetes mellitus with diabetic polyneuropathy: Secondary | ICD-10-CM

## 2013-03-12 MED ORDER — SPIRONOLACTONE 12.5 MG HALF TABLET: 25.0000 mg | ORAL_TABLET | Freq: Every day | ORAL | Status: DC

## 2013-03-12 NOTE — Assessment & Plan Note (Signed)
Persistent symptoms of pain. Per pt, MRI showed rotator cuff tear and orthopedic surgeon suggested repeat steroid injection. Encouraged him to avoid additional steroids because of ongoing issues with hyperglycemia. Would need cardiology clearance prior to left shoulder rotator cuff repair.

## 2013-03-12 NOTE — Progress Notes (Signed)
Subjective:    Patient ID: Steve Phillips, male    DOB: [birth year], 67 y.o.   MRN: [id number]  HPI 67 year old male with history of diabetes, hypertension, coronary artery disease, peripheral neuropathy presents for followup after recent hospitalization. He was admitted to the hospital on Tuesday, April 8 after having nausea, vomiting and weakness. He was found by his wife to be hypotensive with blood pressures in the 70s over 40s. This is unusual for him as his blood pressure typically runs high and has been difficult to control. He was transported to the hospital by EMS and he was admitted because of hypotension. Ultimately this required volume resuscitation. His symptoms improved and he was discharged on Thursday, April 10. Since he has been home, he reports his blood pressure has been running low, typically 100s over 60s. Of note, he has made efforts at weight loss over the last several months and has been following a healthier diet and exercising by walking in a local pool for one hour 3-4 days per week. He reports that he has been compliant with his blood pressure medicine however he did not take them this morning. He denies any recurrence of nausea or vomiting.  In regards to his blood sugars, he reports they have been variable. He continues to have some elevated blood sugars greater than 250. Notably, he had cortisone injection in his left shoulder 3 weeks ago. Since that time, blood sugars have been much more difficult to control. He notes that his orthopedic surgeon did an MRI of his left shoulder which showed rotator cuff tear. They have suggested potential surgical intervention versus repeat steroid injection.  Outpatient Encounter Prescriptions as of 03/12/2013  Medication Sig Dispense Refill  . aspirin 81 MG tablet Take 243 mg by mouth daily.       . carvedilol (COREG) 25 MG tablet Take 1 tablet (25 mg total) by mouth 2 (two) times daily.  60 tablet  11  . clopidogrel (PLAVIX) 75 MG tablet  Take 1 tablet (75 mg total) by mouth daily.  90 tablet  3  . cyanocobalamin (,VITAMIN B-12,) 1000 MCG/ML injection Inject 1 mL (1,000 mcg total) into the muscle every 30 (thirty) days.  30 mL  3  . DULoxetine (CYMBALTA) 60 MG capsule Take 1 capsule (60 mg total) by mouth daily.  90 capsule  3  . glucose blood (FREESTYLE TEST STRIPS) test strip Use to check blood sugar up to three times daily  100 each  11  . HYDROcodone-acetaminophen (NORCO/VICODIN) 5-325 MG per tablet Take 1 tablet by mouth every 8 (eight) hours as needed for pain.  90 tablet  0  . insulin aspart (NOVOLOG FLEXPEN) 100 UNIT/ML injection Inject 25 units before meals per sliding scale. +  3 vial  3  . isosorbide mononitrate (IMDUR) 30 MG 24 hr tablet Take 1 tablet (30 mg total) by mouth daily.  90 tablet  4  . LANTUS 100 UNIT/ML injection INJECT 80 UNITS            SUBCUTANEOUS TWICE DAILY  150 mL  3  . Liraglutide (VICTOZA) 18 MG/3ML SOLN Inject 0.3 mLs (1.8 mg total) into the skin daily.  27 mL  3  . lisinopril-hydrochlorothiazide (PRINZIDE,ZESTORETIC) 20-12.5 MG per tablet Take 1 tablet by mouth daily.      . metFORMIN (GLUCOPHAGE) 1000 MG tablet Take 1 tablet (1,000 mg total) by mouth 2 (two) times daily.  180 tablet  3  . NITROGLYCERIN PO Take by mouth. Sublingual....specific  dose is unknown       . pantoprazole (PROTONIX) 40 MG tablet Take 1 tablet (40 mg total) by mouth 2 (two) times daily.  180 tablet  3  . pregabalin (LYRICA) 75 MG capsule Take 1 capsule (75 mg total) by mouth 2 (two) times daily.  180 capsule  4  . rosuvastatin (CRESTOR) 10 MG tablet Take 10 mg by mouth daily.      Marland Kitchen spironolactone (ALDACTONE) 12.5 mg TABS Take 1 tablet (25 mg total) by mouth daily.  90 tablet  3  . tiZANidine (ZANAFLEX) 4 MG tablet Take 1 tablet (4 mg total) by mouth 3 (three) times daily as needed.  270 tablet  3   No facility-administered encounter medications on file as of 03/12/2013.   BP 104/76  Pulse 83  Temp(Src) 97.8 F (36.6  C) (Oral)  SpO2 95%    Review of Systems  Constitutional: Positive for fatigue. Negative for fever, chills, activity change, appetite change and unexpected weight change.  Eyes: Negative for visual disturbance.  Respiratory: Negative for cough and shortness of breath.   Cardiovascular: Negative for chest pain, palpitations and leg swelling.  Gastrointestinal: Negative for abdominal pain and abdominal distention.  Genitourinary: Negative for dysuria, urgency and difficulty urinating.  Musculoskeletal: Positive for myalgias and arthralgias. Negative for gait problem.  Skin: Negative for color change and rash.  Hematological: Negative for adenopathy.  Psychiatric/Behavioral: Negative for sleep disturbance and dysphoric mood. The patient is not nervous/anxious.        Objective:   Physical Exam  Constitutional: He is oriented to person, place, and time. He appears well-developed and well-nourished. No distress.  HENT:  Head: Normocephalic and atraumatic.  Right Ear: External ear normal.  Left Ear: External ear normal.  Nose: Nose normal.  Mouth/Throat: Oropharynx is clear and moist. No oropharyngeal exudate.  Eyes: Conjunctivae and EOM are normal. Pupils are equal, round, and reactive to light. Right eye exhibits no discharge. Left eye exhibits no discharge. No scleral icterus.  Neck: Normal range of motion. Neck supple. No tracheal deviation present. No thyromegaly present.  Cardiovascular: Normal rate, regular rhythm and normal heart sounds.  Exam reveals no gallop and no friction rub.   No murmur heard. Pulmonary/Chest: Effort normal and breath sounds normal. No accessory muscle usage. Not tachypneic. No respiratory distress. He has no decreased breath sounds. He has no wheezes. He has no rhonchi. He has no rales. He exhibits no tenderness.  Musculoskeletal: Normal range of motion. He exhibits no edema.  Lymphadenopathy:    He has no cervical adenopathy.  Neurological: He is alert  and oriented to person, place, and time. No cranial nerve deficit. Coordination normal.  Skin: Skin is warm and dry. No rash noted. He is not diaphoretic. No erythema. No pallor.  Psychiatric: He has a normal mood and affect. His behavior is normal. Judgment and thought content normal.          Assessment & Plan:

## 2013-03-12 NOTE — Assessment & Plan Note (Signed)
Discharged from hospital 4/10 after admission for nausea/vomiting with hypotension/SIRS requiring volume resuscitation. Suspect symptoms were secondary to viral syndrome. Symptoms have resolved, however pt continues to have low BP at home, typically 100s/60s. Will decrease spirinolactone to 12.5mg  daily. Hypotension may be in part to weight loss with change in diet and exercise ( water walking 3-4 times per week). Will follow BP at home. Pt will email or call if BP consistently <120/80. Consider stopping HCTZ if persistently low. Follow up 3 months and prn.

## 2013-03-12 NOTE — Assessment & Plan Note (Signed)
Lab Results  Component Value Date   HGBA1C 10.4* 02/23/2013   BG continue to be variable with some elevated BG after recent steroid injection left shoulder. Encouraged him to avoid use of additional steroid injections. Encouraged continued compliance with medications and continued effort at healthy diet and regular physical activity. Follow up in 3 months for repeat A1c.

## 2013-03-12 NOTE — Telephone Encounter (Signed)
Message copied by Theola Sequin on Thu Mar 12, 2013  1:20 PM ------      Message from: Ronna Polio A      Created: Thu Mar 12, 2013 12:33 PM      Regarding: FW: Blood Pressure       See below, Can you let mr. Limehouse know to HOLD spironolactone until BP improves? Thanks      ----- Message -----         From: Iran Ouch, MD         Sent: 03/12/2013  11:51 AM           To: Wynona Dove, MD      Subject: RE: Blood Pressure                                       That should be fine. I think we can hold Sprinolactone for now until BP improves.             ----- Message -----         From: Wynona Dove, MD         Sent: 03/12/2013  10:38 AM           To: Iran Ouch, MD      Subject: Blood Pressure                                           Mr. Rase BP continues to run low after recent hospitalization. I asked him to cut Spirinolactone to 12.5mg  daily and if BP continues to be low, I was planning to stop HCTZ. Is this okay with you?      Thanks,      Candise Bowens             ------

## 2013-03-12 NOTE — Assessment & Plan Note (Addendum)
BP Readings from Last 3 Encounters:  03/12/13 104/76  02/23/13 140/90  02/02/13 110/60   BP recently low after hospitalization. Will stop spirinolactone. Plan to stop HCTZ if BP continues to be low. He will email or call with BP readings over next few weeks. Follow up 3 months and prn.

## 2013-03-12 NOTE — Telephone Encounter (Signed)
Spoke with patient wife and informed her to hold off on taking the Spironolactone for now. She verbalized understanding.

## 2013-03-17 ENCOUNTER — Telehealth: Payer: Self-pay | Admitting: *Deleted

## 2013-03-17 NOTE — Telephone Encounter (Signed)
Informed patient wife of this last and she verbally agreed understanding.

## 2013-03-17 NOTE — Telephone Encounter (Signed)
Message copied by Theola Sequin on Tue Mar 17, 2013  3:06 PM ------      Message from: Ronna Polio A      Created: Thu Mar 12, 2013 12:33 PM      Regarding: FW: Blood Pressure       See below, Can you let mr. Routh know to HOLD spironolactone until BP improves? Thanks      ----- Message -----         From: Iran Ouch, MD         Sent: 03/12/2013  11:51 AM           To: Wynona Dove, MD      Subject: RE: Blood Pressure                                       That should be fine. I think we can hold Sprinolactone for now until BP improves.             ----- Message -----         From: Wynona Dove, MD         Sent: 03/12/2013  10:38 AM           To: Iran Ouch, MD      Subject: Blood Pressure                                           Mr. Sides BP continues to run low after recent hospitalization. I asked him to cut Spirinolactone to 12.5mg  daily and if BP continues to be low, I was planning to stop HCTZ. Is this okay with you?      Thanks,      Candise Bowens             ------

## 2013-03-18 ENCOUNTER — Ambulatory Visit: Payer: Self-pay | Admitting: Orthopedic Surgery

## 2013-03-18 DIAGNOSIS — E1142 Type 2 diabetes mellitus with diabetic polyneuropathy: Secondary | ICD-10-CM | POA: Diagnosis not present

## 2013-03-18 DIAGNOSIS — E1149 Type 2 diabetes mellitus with other diabetic neurological complication: Secondary | ICD-10-CM | POA: Diagnosis not present

## 2013-03-18 DIAGNOSIS — M4802 Spinal stenosis, cervical region: Secondary | ICD-10-CM | POA: Diagnosis not present

## 2013-03-18 DIAGNOSIS — L97909 Non-pressure chronic ulcer of unspecified part of unspecified lower leg with unspecified severity: Secondary | ICD-10-CM | POA: Diagnosis not present

## 2013-03-25 ENCOUNTER — Ambulatory Visit (INDEPENDENT_AMBULATORY_CARE_PROVIDER_SITE_OTHER): Payer: Medicare Other | Admitting: *Deleted

## 2013-03-25 DIAGNOSIS — E538 Deficiency of other specified B group vitamins: Secondary | ICD-10-CM | POA: Diagnosis not present

## 2013-03-25 MED ORDER — CYANOCOBALAMIN 1000 MCG/ML IJ SOLN
1000.0000 ug | Freq: Once | INTRAMUSCULAR | Status: AC
  Administered 2013-03-25: 1000 ug via INTRAMUSCULAR

## 2013-03-27 ENCOUNTER — Telehealth: Payer: Self-pay | Admitting: Internal Medicine

## 2013-03-27 DIAGNOSIS — M4802 Spinal stenosis, cervical region: Secondary | ICD-10-CM | POA: Diagnosis not present

## 2013-03-27 DIAGNOSIS — M47812 Spondylosis without myelopathy or radiculopathy, cervical region: Secondary | ICD-10-CM | POA: Diagnosis not present

## 2013-03-27 DIAGNOSIS — M4712 Other spondylosis with myelopathy, cervical region: Secondary | ICD-10-CM | POA: Diagnosis not present

## 2013-03-27 NOTE — Telephone Encounter (Signed)
Spoke with patient wife and she does not know when the surgery is scheduled. They will call her next week to let her know.

## 2013-03-27 NOTE — Telephone Encounter (Signed)
Pt has been to see Dr. Claris Gladden.  Pt had MRI on neck and Dr. Claris Gladden sent him to [facility] in [place] and they had to go to [employer].  Dr. Gerlene Fee is going to operate on him and he has to come off of Plavix.  They need permission to come off the Plavix before and after the surgery.  Needing to have the surgery asap so pt wife, Steve Phillips, would like to hear back asap.

## 2013-03-29 NOTE — Telephone Encounter (Signed)
He will need to check with Dr. Kirke Corin before stopping Plavix.

## 2013-03-30 ENCOUNTER — Telehealth: Payer: Self-pay

## 2013-03-30 DIAGNOSIS — Z7902 Long term (current) use of antithrombotics/antiplatelets: Secondary | ICD-10-CM | POA: Diagnosis not present

## 2013-03-30 DIAGNOSIS — Z006 Encounter for examination for normal comparison and control in clinical research program: Secondary | ICD-10-CM | POA: Diagnosis not present

## 2013-03-30 DIAGNOSIS — I2510 Atherosclerotic heart disease of native coronary artery without angina pectoris: Secondary | ICD-10-CM | POA: Diagnosis not present

## 2013-03-30 DIAGNOSIS — M47812 Spondylosis without myelopathy or radiculopathy, cervical region: Secondary | ICD-10-CM | POA: Diagnosis not present

## 2013-03-30 NOTE — Telephone Encounter (Signed)
Stay on Aspirin 81 mg once daily.

## 2013-03-30 NOTE — Telephone Encounter (Signed)
Wife informed Understanding verb Will fax letter to Dr. Coralee Rud at [facility]

## 2013-03-30 NOTE — Telephone Encounter (Signed)
He is going to have the neck surgery on his spine and they will be going through his throat.

## 2013-03-30 NOTE — Telephone Encounter (Signed)
Letter faxed.

## 2013-03-30 NOTE — Telephone Encounter (Signed)
He is at low risk. He can hold Plavix 7 days before the surgery.

## 2013-03-30 NOTE — Telephone Encounter (Signed)
Ok to hold ASA as well? Takes this TID

## 2013-03-30 NOTE — Telephone Encounter (Signed)
Pt is having spine surgery and needs cardiac clearance to come off Plavix. Please advise.

## 2013-03-30 NOTE — Telephone Encounter (Signed)
Please see below and advise. thanks 

## 2013-03-30 NOTE — Telephone Encounter (Signed)
Informed wife Kendal Hymen to check with Dr. Kirke Corin before stopping the Plavix. She agreed with plan.

## 2013-04-02 ENCOUNTER — Telehealth: Payer: Self-pay | Admitting: *Deleted

## 2013-04-02 NOTE — Telephone Encounter (Signed)
He will need to have cardiology clearance prior to surgery

## 2013-04-02 NOTE — Telephone Encounter (Signed)
There are multiple medications, including plavix, which he may need to stop prior to surgery, so he must have cardiology clearance.

## 2013-04-02 NOTE — Telephone Encounter (Signed)
Planning on scheduling him for neck surgery next week, not sure what day. They would like to know how long and when does he need to stop taking the Metformin and look over his medication list to make sure there is nothing else that need to be adjusted before the surgery. Please fax letter to Dr. Aliene Beams [phone]

## 2013-04-03 NOTE — Telephone Encounter (Signed)
You don't have anything next week and they are waiting to get the instructions before giving a date.

## 2013-04-03 NOTE — Telephone Encounter (Signed)
They will also need letter for Metformin.

## 2013-04-03 NOTE — Telephone Encounter (Signed)
We will need to know specifically what kind of surgery he is having to decide about metformin

## 2013-04-03 NOTE — Telephone Encounter (Signed)
They will be going through his throat to operate on his C4, C5 and C7. They are going to put some Titanium in the disc space. Dr. Aliene Beams with [facility] is the one performing the surgery. They have already gotten clearance from his cardiologist.

## 2013-04-03 NOTE — Telephone Encounter (Signed)
He should really have a visit prior to surgery so that we can go over this in detail.

## 2013-04-03 NOTE — Telephone Encounter (Signed)
Informed patient wife of metformin instructions and she verbally agreed but would like to know if he should stop the insulin as well. Need this in a letter faxed to [phone] and mail her the original.

## 2013-04-03 NOTE — Telephone Encounter (Signed)
OK. To be safe he should stop metformin the day of surgery and for 2 days postop.

## 2013-04-03 NOTE — Telephone Encounter (Signed)
He needs a visit. I really cannot give instructions without going through things in detail and getting some information from his surgeon as to surgery. We have received no information from the surgeon.

## 2013-04-06 NOTE — Telephone Encounter (Signed)
Patient wife confirmed appointment for Wednesday at 1015

## 2013-04-08 ENCOUNTER — Ambulatory Visit (INDEPENDENT_AMBULATORY_CARE_PROVIDER_SITE_OTHER): Payer: Medicare Other | Admitting: Internal Medicine

## 2013-04-08 ENCOUNTER — Other Ambulatory Visit: Payer: Self-pay | Admitting: Neurosurgery

## 2013-04-08 ENCOUNTER — Encounter: Payer: Self-pay | Admitting: Internal Medicine

## 2013-04-08 VITALS — BP 140/80 | HR 67 | Temp 97.7°F | Wt 243.0 lb

## 2013-04-08 DIAGNOSIS — E1149 Type 2 diabetes mellitus with other diabetic neurological complication: Secondary | ICD-10-CM

## 2013-04-08 DIAGNOSIS — L97509 Non-pressure chronic ulcer of other part of unspecified foot with unspecified severity: Secondary | ICD-10-CM | POA: Diagnosis not present

## 2013-04-08 DIAGNOSIS — Z01818 Encounter for other preprocedural examination: Secondary | ICD-10-CM

## 2013-04-08 DIAGNOSIS — E1142 Type 2 diabetes mellitus with diabetic polyneuropathy: Secondary | ICD-10-CM

## 2013-04-08 NOTE — Assessment & Plan Note (Addendum)
Pt medically stable and ready for upcoming procedure. Will request notes from neurosurgeon. Discussed recommendations made by his cardiologist to stop Plavix 7 days prior to procedure and to continue aspirin.  Hold metformin 48hr prior to surgery and 48hr after surgery. Will plan to follow up here 4 weeks after surgery.

## 2013-04-08 NOTE — Patient Instructions (Addendum)
Stop Metformin 2 days prior to surgery and then stay off medication for 2 days after surgery.  Stop Plavix 7 days before surgery. Stay on Aspirin.  Continue all other medications.

## 2013-04-08 NOTE — Progress Notes (Signed)
Subjective:    Patient ID: Steve Phillips, male    DOB: 1946/02/05, 67 y.o.   MRN: [id number]  HPI 67 year old male with history of diabetes, hypertension, coronary artery disease, hyperlipidemia presents for preoperative evaluation prior to cervical spine surgery. During evaluation for right shoulder pain he had MRI of the cervical spine which reportedly showed bulging disc. He is scheduled for fusion and laminectomy. He continues to have both upper back pain, right shoulder pain, and right arm weakness. In regards to chronic medical issues, he reports blood sugars have been fairly well-controlled. He is compliant with this medication. He has not had any recent chest pain, shortness of breath, palpitations. He has not had any recent illnesses. He has no history of problems with anesthesia. He has no history of bleeding disorder. He was seen by his cardiologist and preoperative evaluation and instructed to stop Plavix 7 days before his procedure. He was instructed to continue aspirin.  Outpatient Encounter Prescriptions as of 04/08/2013  Medication Sig Dispense Refill  . aspirin 81 MG tablet Take 243 mg by mouth daily.       . carvedilol (COREG) 25 MG tablet Take 1 tablet (25 mg total) by mouth 2 (two) times daily.  60 tablet  11  . clopidogrel (PLAVIX) 75 MG tablet Take 1 tablet (75 mg total) by mouth daily.  90 tablet  3  . cyanocobalamin (,VITAMIN B-12,) 1000 MCG/ML injection Inject 1 mL (1,000 mcg total) into the muscle every 30 (thirty) days.  30 mL  3  . DULoxetine (CYMBALTA) 60 MG capsule Take 1 capsule (60 mg total) by mouth daily.  90 capsule  3  . glucose blood (FREESTYLE TEST STRIPS) test strip Use to check blood sugar up to three times daily  100 each  11  . HYDROcodone-acetaminophen (NORCO/VICODIN) 5-325 MG per tablet Take 1 tablet by mouth every 8 (eight) hours as needed for pain.  90 tablet  0  . insulin aspart (NOVOLOG FLEXPEN) 100 UNIT/ML injection Inject 25 units before meals per  sliding scale. +  3 vial  3  . isosorbide mononitrate (IMDUR) 30 MG 24 hr tablet Take 1 tablet (30 mg total) by mouth daily.  90 tablet  4  . LANTUS 100 UNIT/ML injection INJECT 80 UNITS            SUBCUTANEOUS TWICE DAILY  150 mL  3  . lisinopril-hydrochlorothiazide (PRINZIDE,ZESTORETIC) 20-12.5 MG per tablet Take 1 tablet by mouth daily.      . metFORMIN (GLUCOPHAGE) 1000 MG tablet Take 1 tablet (1,000 mg total) by mouth 2 (two) times daily.  180 tablet  3  . NITROGLYCERIN PO Take by mouth. Sublingual....specific dose is unknown       . pantoprazole (PROTONIX) 40 MG tablet Take 1 tablet (40 mg total) by mouth 2 (two) times daily.  180 tablet  3  . pregabalin (LYRICA) 75 MG capsule Take 1 capsule (75 mg total) by mouth 2 (two) times daily.  180 capsule  4  . rosuvastatin (CRESTOR) 10 MG tablet Take 10 mg by mouth daily.      Marland Kitchen spironolactone (ALDACTONE) 12.5 mg TABS Take 1 tablet (25 mg total) by mouth daily.  90 tablet  3  . tiZANidine (ZANAFLEX) 4 MG tablet Take 1 tablet (4 mg total) by mouth 3 (three) times daily as needed.  270 tablet  3  . [DISCONTINUED] Liraglutide (VICTOZA) 18 MG/3ML SOLN Inject 0.3 mLs (1.8 mg total) into the skin daily.  27 mL  3   No facility-administered encounter medications on file as of 04/08/2013.   BP 140/80  Pulse 67  Temp(Src) 97.7 F (36.5 C) (Oral)  Wt 243 lb (110.224 kg)  BMI 32.95 kg/m2  SpO2 95%  Review of Systems  Constitutional: Negative for fever, chills, activity change, appetite change, fatigue and unexpected weight change.  Eyes: Negative for visual disturbance.  Respiratory: Negative for cough and shortness of breath.   Cardiovascular: Negative for chest pain, palpitations and leg swelling.  Gastrointestinal: Negative for abdominal pain and abdominal distention.  Genitourinary: Negative for dysuria, urgency and difficulty urinating.  Musculoskeletal: Positive for myalgias and arthralgias. Negative for gait problem.  Skin: Negative for  color change and rash.  Neurological: Positive for weakness.  Hematological: Negative for adenopathy.  Psychiatric/Behavioral: Negative for sleep disturbance and dysphoric mood. The patient is not nervous/anxious.        Objective:   Physical Exam  Constitutional: He is oriented to person, place, and time. He appears well-developed and well-nourished. No distress.  HENT:  Head: Normocephalic and atraumatic.  Right Ear: External ear normal.  Left Ear: External ear normal.  Nose: Nose normal.  Mouth/Throat: Oropharynx is clear and moist. No oropharyngeal exudate.  Eyes: Conjunctivae and EOM are normal. Pupils are equal, round, and reactive to light. Right eye exhibits no discharge. Left eye exhibits no discharge. No scleral icterus.  Neck: Normal range of motion. Neck supple. No tracheal deviation present. No thyromegaly present.  Cardiovascular: Normal rate, regular rhythm and normal heart sounds.  Exam reveals no gallop and no friction rub.   No murmur heard. Pulmonary/Chest: Effort normal and breath sounds normal. No respiratory distress. He has no wheezes. He has no rales. He exhibits no tenderness.  Musculoskeletal: He exhibits no edema.       Right shoulder: He exhibits decreased range of motion, pain and decreased strength.  Lymphadenopathy:    He has no cervical adenopathy.  Neurological: He is alert and oriented to person, place, and time. No cranial nerve deficit. Coordination normal.  Skin: Skin is warm and dry. No rash noted. He is not diaphoretic. No erythema. No pallor.  Psychiatric: He has a normal mood and affect. His behavior is normal. Judgment and thought content normal.          Assessment & Plan:

## 2013-04-08 NOTE — Assessment & Plan Note (Signed)
Per pt, BG improved recently. Will plan to recheck A1c in 2 months. Will plan to have pt hold metformin 2 days prior to upcoming procedure and for at least 2 days afterward. He will monitor BG carefully and continue Lantus and Novolog. We will stop Victoza, as some persistent nausea on this medication.

## 2013-04-14 ENCOUNTER — Telehealth: Payer: Self-pay | Admitting: *Deleted

## 2013-04-14 DIAGNOSIS — E1165 Type 2 diabetes mellitus with hyperglycemia: Secondary | ICD-10-CM

## 2013-04-14 MED ORDER — GLUCOSE BLOOD VI STRP: ORAL_STRIP | Status: DC

## 2013-04-14 MED ORDER — PREGABALIN 75 MG PO CAPS: 75.0000 mg | ORAL_CAPSULE | Freq: Two times a day (BID) | ORAL | Status: DC

## 2013-04-14 NOTE — Telephone Encounter (Signed)
Rx sent to pharmacy   

## 2013-04-14 NOTE — Telephone Encounter (Signed)
pregabalin (LYRICA) 75 MG capsule   180 tablets for 90 days

## 2013-04-14 NOTE — Telephone Encounter (Signed)
Refill Request  Freestyle strips   #100  Use one up to three times daily to check blood sugar   PHARMACY NOTE:  Re-send Rx, needs DX code and Refill amount

## 2013-04-16 ENCOUNTER — Encounter (HOSPITAL_COMMUNITY): Payer: Self-pay | Admitting: Pharmacy Technician

## 2013-04-23 ENCOUNTER — Encounter (HOSPITAL_COMMUNITY)
Admission: RE | Admit: 2013-04-23 | Discharge: 2013-04-23 | Disposition: A | Discharge status: Home / Self Care | Payer: Medicare Other | Source: Ambulatory Visit | Attending: Neurosurgery | Admitting: Neurosurgery

## 2013-04-23 ENCOUNTER — Encounter (HOSPITAL_COMMUNITY): Payer: Self-pay

## 2013-04-23 DIAGNOSIS — M539 Dorsopathy, unspecified: Secondary | ICD-10-CM | POA: Diagnosis not present

## 2013-04-23 DIAGNOSIS — Z87891 Personal history of nicotine dependence: Secondary | ICD-10-CM | POA: Diagnosis not present

## 2013-04-23 DIAGNOSIS — I2510 Atherosclerotic heart disease of native coronary artery without angina pectoris: Secondary | ICD-10-CM | POA: Diagnosis not present

## 2013-04-23 DIAGNOSIS — J40 Bronchitis, not specified as acute or chronic: Secondary | ICD-10-CM | POA: Diagnosis not present

## 2013-04-23 DIAGNOSIS — R0602 Shortness of breath: Secondary | ICD-10-CM | POA: Diagnosis not present

## 2013-04-23 DIAGNOSIS — Z79899 Other long term (current) drug therapy: Secondary | ICD-10-CM | POA: Diagnosis not present

## 2013-04-23 DIAGNOSIS — E538 Deficiency of other specified B group vitamins: Secondary | ICD-10-CM | POA: Diagnosis not present

## 2013-04-23 DIAGNOSIS — I10 Essential (primary) hypertension: Secondary | ICD-10-CM | POA: Diagnosis present

## 2013-04-23 DIAGNOSIS — Z23 Encounter for immunization: Secondary | ICD-10-CM | POA: Diagnosis not present

## 2013-04-23 DIAGNOSIS — M4712 Other spondylosis with myelopathy, cervical region: Secondary | ICD-10-CM | POA: Diagnosis not present

## 2013-04-23 DIAGNOSIS — N400 Enlarged prostate without lower urinary tract symptoms: Secondary | ICD-10-CM | POA: Diagnosis present

## 2013-04-23 DIAGNOSIS — M542 Cervicalgia: Secondary | ICD-10-CM | POA: Diagnosis not present

## 2013-04-23 DIAGNOSIS — E785 Hyperlipidemia, unspecified: Secondary | ICD-10-CM | POA: Diagnosis not present

## 2013-04-23 DIAGNOSIS — Z7902 Long term (current) use of antithrombotics/antiplatelets: Secondary | ICD-10-CM | POA: Diagnosis not present

## 2013-04-23 DIAGNOSIS — Z01812 Encounter for preprocedural laboratory examination: Secondary | ICD-10-CM | POA: Diagnosis not present

## 2013-04-23 DIAGNOSIS — Z01818 Encounter for other preprocedural examination: Secondary | ICD-10-CM | POA: Diagnosis not present

## 2013-04-23 DIAGNOSIS — M4802 Spinal stenosis, cervical region: Secondary | ICD-10-CM | POA: Diagnosis not present

## 2013-04-23 DIAGNOSIS — Z7982 Long term (current) use of aspirin: Secondary | ICD-10-CM | POA: Diagnosis not present

## 2013-04-23 DIAGNOSIS — E119 Type 2 diabetes mellitus without complications: Secondary | ICD-10-CM | POA: Diagnosis not present

## 2013-04-23 DIAGNOSIS — F329 Major depressive disorder, single episode, unspecified: Secondary | ICD-10-CM | POA: Diagnosis present

## 2013-04-23 DIAGNOSIS — Z794 Long term (current) use of insulin: Secondary | ICD-10-CM | POA: Diagnosis not present

## 2013-04-23 DIAGNOSIS — Z9861 Coronary angioplasty status: Secondary | ICD-10-CM | POA: Diagnosis not present

## 2013-04-23 HISTORY — DX: Headache: R51

## 2013-04-23 HISTORY — DX: Shortness of breath: R06.02

## 2013-04-23 LAB — CBC
HCT: 40.6 % (ref 39.0–52.0)
MCH: 25.7 pg — ABNORMAL LOW (ref 26.0–34.0)
MCHC: 32.5 g/dL (ref 30.0–36.0)
RDW: 14.6 % (ref 11.5–15.5)

## 2013-04-23 LAB — BASIC METABOLIC PANEL
BUN: 12 mg/dL (ref 6–23)
Chloride: 99 mEq/L (ref 96–112)
Creatinine, Ser: 0.83 mg/dL (ref 0.50–1.35)
GFR calc Af Amer: >90 mL/min (ref >90)
Glucose, Bld: 214 mg/dL — ABNORMAL HIGH (ref 70–99)

## 2013-04-23 LAB — SURGICAL PCR SCREEN: MRSA, PCR: NEGATIVE

## 2013-04-23 NOTE — Pre-Procedure Instructions (Signed)
Steve Phillips  04/23/2013   Your procedure is scheduled on:  04/30/13  Report to [facility] at 1220 pm  Call this number if you have problems the morning of surgery: [phone]   Remember:   Do not eat food or drink liquids after midnight.   Take these medicines the morning of surgery with A SIP OF WATER: carvedilol,cymbalta,hydorcodone,imdur,protonix   Do not wear jewelry, make-up or nail polish.  Do not wear lotions, powders, or perfumes. You may wear deodorant.  Do not shave 48 hours prior to surgery. Men may shave face and neck.  Do not bring valuables to the hospital.  [facility] is not responsible                   for any belongings or valuables.  Contacts, dentures or bridgework may not be worn into surgery.  Leave suitcase in the car. After surgery it may be brought to your room.  For patients admitted to the hospital, checkout time is 11:00 AM the day of  discharge.   Patients discharged the day of surgery will not be allowed to drive  home.  Name and phone number of your driver: family  Special Instructions: Incentive Spirometry - Practice and bring it with you on the day of surgery.   Please read over the following fact sheets that you were given: Pain Booklet, Coughing and Deep Breathing, MRSA Information and Surgical Site Infection Prevention

## 2013-04-28 ENCOUNTER — Ambulatory Visit (INDEPENDENT_AMBULATORY_CARE_PROVIDER_SITE_OTHER): Payer: Medicare Other | Admitting: *Deleted

## 2013-04-28 DIAGNOSIS — E538 Deficiency of other specified B group vitamins: Secondary | ICD-10-CM

## 2013-04-28 MED ORDER — CYANOCOBALAMIN 1000 MCG/ML IJ SOLN
1000.0000 ug | Freq: Once | INTRAMUSCULAR | Status: AC
  Administered 2013-04-28: 1000 ug via INTRAMUSCULAR

## 2013-04-29 MED ORDER — DEXAMETHASONE SODIUM PHOSPHATE 10 MG/ML IJ SOLN
10.0000 mg | INTRAMUSCULAR | Status: AC
  Administered 2013-04-30: 10 mg via INTRAVENOUS
  Filled 2013-04-29: qty 1

## 2013-04-29 MED ORDER — CEFAZOLIN SODIUM-DEXTROSE 2-3 GM-% IV SOLR
2.0000 g | INTRAVENOUS | Status: AC
  Administered 2013-04-30: 2 g via INTRAVENOUS
  Filled 2013-04-29: qty 50

## 2013-04-30 ENCOUNTER — Inpatient Hospital Stay (HOSPITAL_COMMUNITY): Payer: Medicare Other | Admitting: Anesthesiology

## 2013-04-30 ENCOUNTER — Encounter (HOSPITAL_COMMUNITY): Payer: Self-pay | Admitting: Anesthesiology

## 2013-04-30 ENCOUNTER — Inpatient Hospital Stay (HOSPITAL_COMMUNITY): Payer: Medicare Other

## 2013-04-30 ENCOUNTER — Inpatient Hospital Stay (HOSPITAL_COMMUNITY)
Admission: RE | Admit: 2013-04-30 | Discharge: 2013-05-02 | DRG: 473 | Disposition: A | Discharge status: Home / Self Care | Payer: Medicare Other | Source: Ambulatory Visit | Attending: Neurosurgery | Admitting: Neurosurgery

## 2013-04-30 ENCOUNTER — Encounter (HOSPITAL_COMMUNITY)
Admission: RE | Disposition: A | Discharge status: Home / Self Care | Payer: Self-pay | Source: Ambulatory Visit | Attending: Neurosurgery

## 2013-04-30 DIAGNOSIS — Z9861 Coronary angioplasty status: Secondary | ICD-10-CM

## 2013-04-30 DIAGNOSIS — I10 Essential (primary) hypertension: Secondary | ICD-10-CM | POA: Diagnosis present

## 2013-04-30 DIAGNOSIS — Z7982 Long term (current) use of aspirin: Secondary | ICD-10-CM

## 2013-04-30 DIAGNOSIS — Z79899 Other long term (current) drug therapy: Secondary | ICD-10-CM

## 2013-04-30 DIAGNOSIS — Z7902 Long term (current) use of antithrombotics/antiplatelets: Secondary | ICD-10-CM

## 2013-04-30 DIAGNOSIS — Z87891 Personal history of nicotine dependence: Secondary | ICD-10-CM

## 2013-04-30 DIAGNOSIS — M4712 Other spondylosis with myelopathy, cervical region: Principal | ICD-10-CM | POA: Diagnosis present

## 2013-04-30 DIAGNOSIS — F329 Major depressive disorder, single episode, unspecified: Secondary | ICD-10-CM | POA: Diagnosis present

## 2013-04-30 DIAGNOSIS — E785 Hyperlipidemia, unspecified: Secondary | ICD-10-CM | POA: Diagnosis present

## 2013-04-30 DIAGNOSIS — Z23 Encounter for immunization: Secondary | ICD-10-CM

## 2013-04-30 DIAGNOSIS — F3289 Other specified depressive episodes: Secondary | ICD-10-CM | POA: Diagnosis present

## 2013-04-30 DIAGNOSIS — Z01818 Encounter for other preprocedural examination: Secondary | ICD-10-CM

## 2013-04-30 DIAGNOSIS — I2510 Atherosclerotic heart disease of native coronary artery without angina pectoris: Secondary | ICD-10-CM | POA: Diagnosis present

## 2013-04-30 DIAGNOSIS — Z01812 Encounter for preprocedural laboratory examination: Secondary | ICD-10-CM

## 2013-04-30 DIAGNOSIS — N400 Enlarged prostate without lower urinary tract symptoms: Secondary | ICD-10-CM | POA: Diagnosis present

## 2013-04-30 DIAGNOSIS — E119 Type 2 diabetes mellitus without complications: Secondary | ICD-10-CM | POA: Diagnosis present

## 2013-04-30 DIAGNOSIS — Z794 Long term (current) use of insulin: Secondary | ICD-10-CM

## 2013-04-30 HISTORY — PX: ANTERIOR CERVICAL DECOMP/DISCECTOMY FUSION: SHX1161

## 2013-04-30 LAB — GLUCOSE, CAPILLARY
Glucose-Capillary: 295 mg/dL — ABNORMAL HIGH (ref 70–99)
Glucose-Capillary: 244 mg/dL — ABNORMAL HIGH (ref 70–99)
Glucose-Capillary: 232 mg/dL — ABNORMAL HIGH (ref 70–99)
Glucose-Capillary: 366 mg/dL — ABNORMAL HIGH (ref 70–99)

## 2013-04-30 SURGERY — ANTERIOR CERVICAL DECOMPRESSION/DISCECTOMY FUSION 2 LEVELS
Anesthesia: General | Site: Neck | Wound class: Clean

## 2013-04-30 MED ORDER — NITROGLYCERIN 0.4 MG SL SUBL: 0.4000 mg | SUBLINGUAL_TABLET | SUBLINGUAL | Status: DC | PRN

## 2013-04-30 MED ORDER — GLYCOPYRROLATE 0.2 MG/ML IJ SOLN
INTRAMUSCULAR | Status: DC | PRN
  Administered 2013-04-30: 0.2 mg via INTRAVENOUS
  Administered 2013-04-30: 0.6 mg via INTRAVENOUS

## 2013-04-30 MED ORDER — CYCLOBENZAPRINE HCL 10 MG PO TABS
10.0000 mg | ORAL_TABLET | Freq: Three times a day (TID) | ORAL | Status: DC | PRN
  Administered 2013-04-30 – 2013-05-01 (×3): 10 mg via ORAL
  Filled 2013-04-30 (×3): qty 1

## 2013-04-30 MED ORDER — ONDANSETRON HCL 4 MG/2ML IJ SOLN
4.0000 mg | INTRAMUSCULAR | Status: DC | PRN
  Administered 2013-05-02: 4 mg via INTRAVENOUS
  Filled 2013-04-30: qty 2

## 2013-04-30 MED ORDER — LACTATED RINGERS IV SOLN
INTRAVENOUS | Status: DC | PRN
  Administered 2013-04-30 (×3): via INTRAVENOUS

## 2013-04-30 MED ORDER — SODIUM CHLORIDE 0.9 % IJ SOLN: 3.0000 mL | INTRAMUSCULAR | Status: DC | PRN

## 2013-04-30 MED ORDER — OXYCODONE-ACETAMINOPHEN 5-325 MG PO TABS
1.0000 | ORAL_TABLET | ORAL | Status: DC | PRN
  Administered 2013-04-30 – 2013-05-01 (×4): 2 via ORAL
  Filled 2013-04-30 (×4): qty 2

## 2013-04-30 MED ORDER — BACITRACIN 50000 UNITS IM SOLR
INTRAMUSCULAR | Status: AC
  Filled 2013-04-30: qty 1

## 2013-04-30 MED ORDER — ACETAMINOPHEN 650 MG RE SUPP: 650.0000 mg | RECTAL | Status: DC | PRN

## 2013-04-30 MED ORDER — SODIUM CHLORIDE 0.9 % IR SOLN
Status: DC | PRN
  Administered 2013-04-30: 16:00:00

## 2013-04-30 MED ORDER — DULOXETINE HCL 60 MG PO CPEP
60.0000 mg | ORAL_CAPSULE | Freq: Every day | ORAL | Status: DC
  Administered 2013-05-01: 60 mg via ORAL
  Filled 2013-04-30 (×2): qty 1

## 2013-04-30 MED ORDER — 0.9 % SODIUM CHLORIDE (POUR BTL) OPTIME
TOPICAL | Status: DC | PRN
  Administered 2013-04-30: 1000 mL

## 2013-04-30 MED ORDER — POTASSIUM CHLORIDE IN NACL 20-0.45 MEQ/L-% IV SOLN
INTRAVENOUS | Status: DC
  Administered 2013-04-30: 23:00:00 via INTRAVENOUS
  Filled 2013-04-30 (×4): qty 1000

## 2013-04-30 MED ORDER — SODIUM CHLORIDE 0.9 % IV SOLN
INTRAVENOUS | Status: AC
  Filled 2013-04-30: qty 500

## 2013-04-30 MED ORDER — METFORMIN HCL 500 MG PO TABS
1000.0000 mg | ORAL_TABLET | Freq: Two times a day (BID) | ORAL | Status: DC
  Administered 2013-05-01 – 2013-05-02 (×3): 1000 mg via ORAL
  Filled 2013-04-30 (×5): qty 2

## 2013-04-30 MED ORDER — CARVEDILOL 25 MG PO TABS
25.0000 mg | ORAL_TABLET | Freq: Two times a day (BID) | ORAL | Status: DC
  Administered 2013-04-30 – 2013-05-01 (×3): 25 mg via ORAL
  Filled 2013-04-30 (×5): qty 1

## 2013-04-30 MED ORDER — SODIUM CHLORIDE 0.9 % IJ SOLN
3.0000 mL | Freq: Two times a day (BID) | INTRAMUSCULAR | Status: DC
  Administered 2013-05-01 (×2): 3 mL via INTRAVENOUS

## 2013-04-30 MED ORDER — HEMOSTATIC AGENTS (NO CHARGE) OPTIME
TOPICAL | Status: DC | PRN
  Administered 2013-04-30: 1 via TOPICAL

## 2013-04-30 MED ORDER — ESMOLOL HCL 10 MG/ML IV SOLN
INTRAVENOUS | Status: DC | PRN
  Administered 2013-04-30: 50 mg via INTRAVENOUS

## 2013-04-30 MED ORDER — MENTHOL 3 MG MT LOZG: 1.0000 | LOZENGE | OROMUCOSAL | Status: DC | PRN

## 2013-04-30 MED ORDER — METFORMIN HCL 500 MG PO TABS: 1000.0000 mg | ORAL_TABLET | Freq: Two times a day (BID) | ORAL | Status: DC

## 2013-04-30 MED ORDER — HYDROMORPHONE HCL PF 1 MG/ML IJ SOLN
0.2500 mg | INTRAMUSCULAR | Status: DC | PRN
  Administered 2013-04-30 (×2): 0.5 mg via INTRAVENOUS

## 2013-04-30 MED ORDER — ACETAMINOPHEN 325 MG PO TABS: 650.0000 mg | ORAL_TABLET | ORAL | Status: DC | PRN

## 2013-04-30 MED ORDER — PHENYLEPHRINE HCL 10 MG/ML IJ SOLN
INTRAMUSCULAR | Status: DC | PRN
  Administered 2013-04-30: 80 ug via INTRAVENOUS
  Administered 2013-04-30: 40 ug via INTRAVENOUS
  Administered 2013-04-30 (×2): 80 ug via INTRAVENOUS
  Administered 2013-04-30 (×2): 40 ug via INTRAVENOUS
  Administered 2013-04-30: 80 ug via INTRAVENOUS

## 2013-04-30 MED ORDER — FENTANYL CITRATE 0.05 MG/ML IJ SOLN
INTRAMUSCULAR | Status: DC | PRN
  Administered 2013-04-30: 150 ug via INTRAVENOUS
  Administered 2013-04-30: 50 ug via INTRAVENOUS
  Administered 2013-04-30: 100 ug via INTRAVENOUS
  Administered 2013-04-30: 50 ug via INTRAVENOUS

## 2013-04-30 MED ORDER — PHENOL 1.4 % MT LIQD: 1.0000 | OROMUCOSAL | Status: DC | PRN

## 2013-04-30 MED ORDER — ARTIFICIAL TEARS OP OINT
TOPICAL_OINTMENT | OPHTHALMIC | Status: DC | PRN
  Administered 2013-04-30: 1 via OPHTHALMIC

## 2013-04-30 MED ORDER — HYDROMORPHONE HCL PF 1 MG/ML IJ SOLN
INTRAMUSCULAR | Status: AC
  Filled 2013-04-30: qty 1

## 2013-04-30 MED ORDER — CEFAZOLIN SODIUM-DEXTROSE 2-3 GM-% IV SOLR
2.0000 g | Freq: Three times a day (TID) | INTRAVENOUS | Status: AC
  Administered 2013-04-30 – 2013-05-01 (×2): 2 g via INTRAVENOUS
  Filled 2013-04-30 (×2): qty 50

## 2013-04-30 MED ORDER — HYDROCHLOROTHIAZIDE 12.5 MG PO CAPS
12.5000 mg | ORAL_CAPSULE | Freq: Every day | ORAL | Status: DC
  Administered 2013-04-30 – 2013-05-01 (×2): 12.5 mg via ORAL
  Filled 2013-04-30 (×3): qty 1

## 2013-04-30 MED ORDER — ONDANSETRON HCL 4 MG/2ML IJ SOLN
4.0000 mg | Freq: Once | INTRAMUSCULAR | Status: AC | PRN
  Administered 2013-04-30: 4 mg via INTRAVENOUS

## 2013-04-30 MED ORDER — NEOSTIGMINE METHYLSULFATE 1 MG/ML IJ SOLN
INTRAMUSCULAR | Status: DC | PRN
  Administered 2013-04-30: 4 mg via INTRAVENOUS

## 2013-04-30 MED ORDER — SODIUM CHLORIDE 0.9 % IV SOLN: 250.0000 mL | INTRAVENOUS | Status: DC

## 2013-04-30 MED ORDER — INSULIN ASPART 100 UNIT/ML ~~LOC~~ SOLN
0.0000 [IU] | Freq: Every day | SUBCUTANEOUS | Status: DC
  Administered 2013-04-30: 4 [IU] via SUBCUTANEOUS
  Administered 2013-05-01: 5 [IU] via SUBCUTANEOUS

## 2013-04-30 MED ORDER — ROCURONIUM BROMIDE 100 MG/10ML IV SOLN
INTRAVENOUS | Status: DC | PRN
  Administered 2013-04-30: 10 mg via INTRAVENOUS
  Administered 2013-04-30: 50 mg via INTRAVENOUS
  Administered 2013-04-30 (×2): 10 mg via INTRAVENOUS

## 2013-04-30 MED ORDER — INSULIN ASPART 100 UNIT/ML ~~LOC~~ SOLN
0.0000 [IU] | Freq: Three times a day (TID) | SUBCUTANEOUS | Status: DC
  Administered 2013-05-01: 15 [IU] via SUBCUTANEOUS
  Administered 2013-05-01: 11 [IU] via SUBCUTANEOUS
  Administered 2013-05-01 – 2013-05-02 (×2): 15 [IU] via SUBCUTANEOUS

## 2013-04-30 MED ORDER — ISOSORBIDE MONONITRATE ER 30 MG PO TB24
30.0000 mg | ORAL_TABLET | Freq: Every day | ORAL | Status: DC
  Administered 2013-05-01: 30 mg via ORAL
  Filled 2013-04-30 (×2): qty 1

## 2013-04-30 MED ORDER — INSULIN ASPART 100 UNIT/ML ~~LOC~~ SOLN
4.0000 [IU] | Freq: Three times a day (TID) | SUBCUTANEOUS | Status: DC
  Administered 2013-05-01 (×2): 4 [IU] via SUBCUTANEOUS
  Administered 2013-05-01: 13:00:00 via SUBCUTANEOUS

## 2013-04-30 MED ORDER — ZOLPIDEM TARTRATE 5 MG PO TABS: 5.0000 mg | ORAL_TABLET | Freq: Every evening | ORAL | Status: DC | PRN

## 2013-04-30 MED ORDER — ATORVASTATIN CALCIUM 10 MG PO TABS
10.0000 mg | ORAL_TABLET | Freq: Every day | ORAL | Status: DC
  Administered 2013-05-01: 10 mg via ORAL
  Filled 2013-04-30 (×2): qty 1

## 2013-04-30 MED ORDER — HYDROMORPHONE HCL PF 1 MG/ML IJ SOLN: 1.0000 mg | INTRAMUSCULAR | Status: DC | PRN

## 2013-04-30 MED ORDER — PANTOPRAZOLE SODIUM 40 MG IV SOLR
40.0000 mg | Freq: Every day | INTRAVENOUS | Status: DC
  Administered 2013-04-30 – 2013-05-01 (×2): 40 mg via INTRAVENOUS
  Filled 2013-04-30 (×3): qty 40

## 2013-04-30 MED ORDER — LIDOCAINE HCL (CARDIAC) 20 MG/ML IV SOLN
INTRAVENOUS | Status: DC | PRN
  Administered 2013-04-30: 100 mg via INTRAVENOUS

## 2013-04-30 MED ORDER — ONDANSETRON HCL 4 MG/2ML IJ SOLN
INTRAMUSCULAR | Status: AC
  Filled 2013-04-30: qty 2

## 2013-04-30 MED ORDER — LISINOPRIL 20 MG PO TABS
20.0000 mg | ORAL_TABLET | Freq: Every day | ORAL | Status: DC
  Administered 2013-04-30 – 2013-05-01 (×2): 20 mg via ORAL
  Filled 2013-04-30 (×3): qty 1

## 2013-04-30 MED ORDER — ONDANSETRON HCL 4 MG/2ML IJ SOLN
INTRAMUSCULAR | Status: DC | PRN
  Administered 2013-04-30: 4 mg via INTRAVENOUS

## 2013-04-30 MED ORDER — LIDOCAINE HCL 4 % MT SOLN
OROMUCOSAL | Status: DC | PRN
  Administered 2013-04-30: 4 mL via TOPICAL

## 2013-04-30 MED ORDER — LISINOPRIL-HYDROCHLOROTHIAZIDE 20-12.5 MG PO TABS: 1.0000 | ORAL_TABLET | Freq: Every day | ORAL | Status: DC

## 2013-04-30 MED ORDER — THROMBIN 5000 UNITS EX SOLR
CUTANEOUS | Status: DC | PRN
  Administered 2013-04-30 (×2): 5000 [IU] via TOPICAL

## 2013-04-30 MED ORDER — PREGABALIN 50 MG PO CAPS
75.0000 mg | ORAL_CAPSULE | Freq: Two times a day (BID) | ORAL | Status: DC
  Administered 2013-04-30 – 2013-05-01 (×3): 75 mg via ORAL
  Filled 2013-04-30 (×3): qty 1

## 2013-04-30 MED ORDER — PROPOFOL 10 MG/ML IV BOLUS
INTRAVENOUS | Status: DC | PRN
  Administered 2013-04-30: 300 mg via INTRAVENOUS

## 2013-04-30 SURGICAL SUPPLY — 63 items
BAG DECANTER FOR FLEXI CONT (MISCELLANEOUS) ×2 IMPLANT
BENZOIN TINCTURE PRP APPL 2/3 (GAUZE/BANDAGES/DRESSINGS) ×2 IMPLANT
BIT DRILL TRINICA 2.3MM (BIT) ×1 IMPLANT
BLADE SURG 15 STRL LF DISP TIS (BLADE) ×1 IMPLANT
BLADE SURG 15 STRL SS (BLADE) ×1
BRUSH SCRUB EZ PLAIN DRY (MISCELLANEOUS) ×2 IMPLANT
BUR MATCHSTICK NEURO 3.0 LAGG (BURR) ×2 IMPLANT
CANISTER SUCTION 2500CC (MISCELLANEOUS) ×2 IMPLANT
CLOTH BEACON ORANGE TIMEOUT ST (SAFETY) ×2 IMPLANT
CONT SPEC 4OZ CLIKSEAL STRL BL (MISCELLANEOUS) ×2 IMPLANT
DRAIN SNY WOU 7FLT (WOUND CARE) ×2 IMPLANT
DRAPE C-ARM 42X72 X-RAY (DRAPES) ×6 IMPLANT
DRAPE LAPAROTOMY 100X72 PEDS (DRAPES) ×2 IMPLANT
DRAPE MICROSCOPE LEICA (MISCELLANEOUS) ×4 IMPLANT
DRAPE SURG 17X23 STRL (DRAPES) ×4 IMPLANT
DRESSING TELFA 8X3 (GAUZE/BANDAGES/DRESSINGS) ×2 IMPLANT
DRILL BIT TRINICA 2.3MM (BIT) ×2
DURAPREP 6ML APPLICATOR 50/CS (WOUND CARE) ×2 IMPLANT
ELECT COATED BLADE 2.86 ST (ELECTRODE) ×2 IMPLANT
ELECT REM PT RETURN 9FT ADLT (ELECTROSURGICAL) ×2
ELECTRODE REM PT RTRN 9FT ADLT (ELECTROSURGICAL) ×1 IMPLANT
EVACUATOR SILICONE 100CC (DRAIN) ×2 IMPLANT
GAUZE SPONGE 4X4 16PLY XRAY LF (GAUZE/BANDAGES/DRESSINGS) IMPLANT
GLOVE BIO SURGEON STRL SZ 6.5 (GLOVE) ×6 IMPLANT
GLOVE BIOGEL PI IND STRL 6.5 (GLOVE) ×2 IMPLANT
GLOVE BIOGEL PI IND STRL 7.0 (GLOVE) ×3 IMPLANT
GLOVE BIOGEL PI INDICATOR 6.5 (GLOVE) ×2
GLOVE BIOGEL PI INDICATOR 7.0 (GLOVE) ×3
GLOVE ECLIPSE 7.5 STRL STRAW (GLOVE) ×4 IMPLANT
GLOVE EXAM NITRILE LRG STRL (GLOVE) IMPLANT
GLOVE EXAM NITRILE MD LF STRL (GLOVE) ×2 IMPLANT
GLOVE EXAM NITRILE XL STR (GLOVE) IMPLANT
GLOVE EXAM NITRILE XS STR PU (GLOVE) IMPLANT
GLOVE SS BIOGEL STRL SZ 6.5 (GLOVE) ×2 IMPLANT
GLOVE SUPERSENSE BIOGEL SZ 6.5 (GLOVE) ×2
GOWN BRE IMP SLV AUR LG STRL (GOWN DISPOSABLE) ×4 IMPLANT
GOWN BRE IMP SLV AUR XL STRL (GOWN DISPOSABLE) ×4 IMPLANT
GOWN STRL REIN 2XL LVL4 (GOWN DISPOSABLE) IMPLANT
HEAD HALTER (SOFTGOODS) ×2 IMPLANT
INTERBODY TM 11X14X7-7DEG ANG (Metal Cage) ×4 IMPLANT
KIT BASIN OR (CUSTOM PROCEDURE TRAY) ×2 IMPLANT
KIT ROOM TURNOVER OR (KITS) ×2 IMPLANT
NEEDLE SPNL 20GX3.5 QUINCKE YW (NEEDLE) ×2 IMPLANT
NS IRRIG 1000ML POUR BTL (IV SOLUTION) ×2 IMPLANT
PACK LAMINECTOMY NEURO (CUSTOM PROCEDURE TRAY) ×2 IMPLANT
PAD ARMBOARD 7.5X6 YLW CONV (MISCELLANEOUS) ×4 IMPLANT
PATTIES SURGICAL .75X.75 (GAUZE/BANDAGES/DRESSINGS) IMPLANT
PLATE 22MM (Plate) ×2 IMPLANT
PLATE 24MM (Plate) ×2 IMPLANT
PUTTY BONE GRAFT KIT 2.5ML (Bone Implant) ×2 IMPLANT
RUBBERBAND STERILE (MISCELLANEOUS) ×8 IMPLANT
SCREW SELF DRILL FIXED 14MM (Screw) ×16 IMPLANT
SPONGE GAUZE 4X4 12PLY (GAUZE/BANDAGES/DRESSINGS) IMPLANT
SPONGE INTESTINAL PEANUT (DISPOSABLE) ×2 IMPLANT
SPONGE SURGIFOAM ABS GEL SZ50 (HEMOSTASIS) ×2 IMPLANT
STRIP CLOSURE SKIN 1/2X4 (GAUZE/BANDAGES/DRESSINGS) ×2 IMPLANT
SUT PDS AB 5-0 P3 18 (SUTURE) ×2 IMPLANT
SUT VIC AB 3-0 CP2 18 (SUTURE) ×2 IMPLANT
SYR 20ML ECCENTRIC (SYRINGE) ×2 IMPLANT
TOWEL OR 17X24 6PK STRL BLUE (TOWEL DISPOSABLE) ×2 IMPLANT
TOWEL OR 17X26 10 PK STRL BLUE (TOWEL DISPOSABLE) ×2 IMPLANT
TRAP SPECIMEN MUCOUS 40CC (MISCELLANEOUS) ×2 IMPLANT
WATER STERILE IRR 1000ML POUR (IV SOLUTION) ×2 IMPLANT

## 2013-04-30 NOTE — Op Note (Signed)
Preop diagnosis: Spinal stenosis C4-5 C6-7 Postop diagnosis: Same Procedure: C4-5 C6-7 decompressive anterior cervical discectomy with trabecular metal interbody fusion and Trinica anterior cervical plating Surgeon: Brandyce Dimario Assistant: Lovell Sheehan  After and placed the supine position and 10 pounds halter traction the patient's neck was prepped and draped in usual sterile fashion. Localizing fluoroscopy was used prior to incision to identify the appropriate level. Transverse incision was made in the right anterior neck started midline and headed towards the medial aspect of the sternal cremaster muscle. The platysma muscle was incised transversely and the natural fascial plane between the strap muscles medially and the sternal cleidomastoid laterally was identified and followed down to the interest of the cervical spine. Longus Cole muscles were identified split in the midline and Triple-A bilaterally with unipolar coagulation and Kitner dissection. We started at C4-5 which was confirmed by fluoroscopy. We incised the disc at that level a 15 blade and cleaned out about 90% of the disc material at that level. High-speed drill was used to widen the interspace and bony shavings were saved use later in the case. The microscope was draped brought in the field and used for the remainder of the case. He's microdissection technique the remainder of the disc material down the posterior longitudinal ligament was removed. Ligament was incised transversely and the cut edges removed a Kerrison punch. Herniated disc and bony overgrowth removed to decompress the underlying spinal dura possible foraminal decompression was carried out bilaterally. At this time we inspected this level for any evidence of residual compression and none could be identified. We irrigated copiously and appropriate antibiotic irrigation and measured and chosen 7 mm lordotic graft. Fill them graft were mixture of autologous bone morselized allograft and  impacted it once hemostasis had been confirmed. Proximal we showed to be in good position. And we chose an appropriately length Trinica anterior cervical plate. We placed for drill holes were placed for 40 Miller screws then rotated the locking mechanism into the locked position. Fossae showed good position of the plate and screws. We then turned attention to C6-7. Once again placed the soft tissue retractor and incised the disc a 15 blade. Once and thoroughly cleaned the disc out and widen it with a high-speed drill. Microscope remove the remainder of the disc material down the posterior longitudinal ligament. We then sized ligament removed to decompress the spinal dura and proximal foramen bilaterally. At this time we inspected this) evidence of residual compression and none could be identified. Irrigation was carried out and any bleeding control proper coagulation Gelfoam. We used a 7 mm lordotic graft this level filled with a mixture of autologous bone morselized allograft. We impacted the space without difficulty and chose appropriate length Trinica into cervical plate. We placed 4 screw holes and then placed 14 numerous screws. We rotated the locking mechanism into the locked position and fossae showed good positioning of the plates screws and spacers at both levels. Irrigated copiously and bike irrigation controlled any bleeders proper coagulation. Because of the length of the prevertebral dissection left a prevertebral drain and brought out through a separate stab incision. We then closed the wound with inverted Vicryl on the platysma and subcuticular layer and then placed Steri-Strips on the skin. Sterile dressing and soft collar applied the patient was extubated and taken to recovery in stable condition.

## 2013-04-30 NOTE — Anesthesia Preprocedure Evaluation (Addendum)
Anesthesia Evaluation  Patient identified by MRN, date of birth, ID band Patient awake    Reviewed: Allergy & Precautions, H&P , NPO status , Patient's Chart, lab work & pertinent test results, reviewed documented beta blocker date and time   History of Anesthesia Complications Negative for: history of anesthetic complications  Airway Mallampati: II TM Distance: >3 FB Neck ROM: Full    Dental  (+) Partial Lower, Loose, Dental Advisory Given and Poor Dentition,    Pulmonary shortness of breath and with exertion, former smoker,          Cardiovascular hypertension, Pt. on medications and Pt. on home beta blockers + angina with exertion + CAD and + Cardiac Stents Rhythm:regular Rate:Normal     Neuro/Psych  Headaches, PSYCHIATRIC DISORDERS Depression  Neuromuscular disease (diabetic neuropathy: HNP with pain, weakness, numbness RUE>LUE)    GI/Hepatic negative GI ROS, Neg liver ROS,   Endo/Other  diabetes, Well Controlled, Type 2, Insulin Dependent and Oral Hypoglycemic AgentsMorbid obesity  Renal/GU negative Renal ROS  negative genitourinary   Musculoskeletal  (+) Arthritis -, Osteoarthritis,    Abdominal (+) + obese,   Peds  Hematology   Anesthesia Other Findings   Reproductive/Obstetrics negative OB ROS                        Anesthesia Physical Anesthesia Plan  ASA: III  Anesthesia Plan: General   Post-op Pain Management:    Induction: Intravenous  Airway Management Planned: Oral ETT  Additional Equipment:   Intra-op Plan:   Post-operative Plan: Extubation in OR  Informed Consent: I have reviewed the patients History and Physical, chart, labs and discussed the procedure including the risks, benefits and alternatives for the proposed anesthesia with the patient or authorized representative who has indicated his/her understanding and acceptance.     Plan Discussed with: CRNA,  Anesthesiologist and Surgeon  Anesthesia Plan Comments:         Anesthesia Quick Evaluation

## 2013-04-30 NOTE — H&P (Signed)
Steve Phillips is an 67 y.o. male.   Chief Complaint: Neck pain right arm pain and difficulty with gait HPI: The patient is a 67 year old gentleman who was evaluated in the office for neck pain with radiation towards right arm off date difficulty of a number of months duration. Is getting steadily worse dissection is his primary medical Dr. with this problem for last year. The top of the upper extremity problem could be a shoulder shoe to.shots and the shoulder without relief. An MRI scan of his neck and was referred to me for evaluation. Once in the office his left alarm was asymptomatic. Evaluate the situation it was noted to have significant spondylosis at C4-5 and C6-7 lead to a combination of radiculopathy and myelopathy. After discussing the options the patient requested surgery now comes for a C4-5 and C6-7 anterior cervical discectomy with fusion and plating. I had a long discussion with him regarding the risks and benefits of surgical intervention. The risks discussed include but are not limited to bleeding infection weakness numbness paralysis trouble with instrumentation nonunion coma hoarseness and death. We have discussed alternative methods of therapy offered risks and benefits of nonintervention. He's had the opportunity to ask numerous questions and appears to understand. With this information in hand he has requested to proceed with surgery.  Past Medical History  Diagnosis Date  . Shingles   . Diabetes mellitus   . Hyperlipidemia   . Broken leg     left...s/p pins  . Lower leg pain     chronic ,left leg  . Acute angina   . Depression   . CAD (coronary artery disease)     s/p multiple PCI with stent RCA,LAD and obtuse marginal,followed @ [facility] on the accord study.., cath 02/2012 90% D1, s/p drug-eluting stent Dr. Kirke Corin  . Falls 09/2011  . Dizziness   . Shortness of breath   . Headache(784.0)   . Hypertension     dr Judie Petit Kennith Maes     [facility] in [place]    Past Surgical History   Procedure Laterality Date  . Appendectomy    . Cholecystectomy    . Left leg surgery    . Left foot surgery    . Drainage port in left testicle    . Cardiac catheterization  02/2012  . Back surgery      Family History  Problem Relation Age of Onset  . Cancer Mother     BREAST  . Heart disease Mother     STENT  . Cancer Father     THROAT  . Heart disease Sister     STENT; HTN  . Heart attack Brother   . Hypertension Brother   . Hypertension Sister    Social History:  reports that he quit smoking about 41 years ago. He has never used smokeless tobacco. He reports that he does not drink alcohol or use illicit drugs.  Allergies:  Allergies  Allergen Reactions  . Codeine Nausea Only    Derivatives Nausea.  . Tramadol     unknown  . Trazodone And Nefazodone Nausea Only    Medications Prior to Admission  Medication Sig Dispense Refill  . aspirin 81 MG tablet Take 243 mg by mouth daily.       . carvedilol (COREG) 25 MG tablet Take 1 tablet (25 mg total) by mouth 2 (two) times daily.  60 tablet  11  . clopidogrel (PLAVIX) 75 MG tablet Take 1 tablet (75 mg total) by mouth daily.  90 tablet  3  . cyanocobalamin (,VITAMIN B-12,) 1000 MCG/ML injection Inject 1 mL (1,000 mcg total) into the muscle every 30 (thirty) days.  30 mL  3  . DULoxetine (CYMBALTA) 60 MG capsule Take 1 capsule (60 mg total) by mouth daily.  90 capsule  3  . glucose blood (FREESTYLE TEST STRIPS) test strip Use to check blood sugar up to three times daily  100 each  11  . HYDROcodone-acetaminophen (NORCO/VICODIN) 5-325 MG per tablet Take 1 tablet by mouth every 8 (eight) hours as needed for pain.  90 tablet  0  . insulin aspart (NOVOLOG FLEXPEN) 100 UNIT/ML injection Inject 25 units before meals per sliding scale. +  3 vial  3  . isosorbide mononitrate (IMDUR) 30 MG 24 hr tablet Take 1 tablet (30 mg total) by mouth daily.  90 tablet  4  . LANTUS 100 UNIT/ML injection INJECT 80 UNITS            SUBCUTANEOUS  TWICE DAILY  150 mL  3  . lisinopril-hydrochlorothiazide (PRINZIDE,ZESTORETIC) 20-12.5 MG per tablet Take 1 tablet by mouth daily.      . metFORMIN (GLUCOPHAGE) 1000 MG tablet Take 1 tablet (1,000 mg total) by mouth 2 (two) times daily.  180 tablet  3  . pantoprazole (PROTONIX) 40 MG tablet Take 1 tablet (40 mg total) by mouth 2 (two) times daily.  180 tablet  3  . pregabalin (LYRICA) 75 MG capsule Take 1 capsule (75 mg total) by mouth 2 (two) times daily.  180 capsule  2  . rosuvastatin (CRESTOR) 10 MG tablet Take 10 mg by mouth daily.      Marland Kitchen tiZANidine (ZANAFLEX) 4 MG tablet Take 4 mg by mouth every 8 (eight) hours as needed (pain).      . nitroGLYCERIN (NITROSTAT) 0.4 MG SL tablet Place 0.4 mg under the tongue every 5 (five) minutes as needed for chest pain.        Results for orders placed during the hospital encounter of 04/30/13 (from the past 48 hour(s))  GLUCOSE, CAPILLARY     Status: Abnormal   Collection Time    04/30/13 12:42 PM      Result Value Range   Glucose-Capillary 295 (*) 70 - 99 mg/dL   No results found.  Review of systems is positive for balance disturbance and trouble with his arms. He denies a problems sinus problems chest pain shortness of breath asthma emphysema gastrointestinal or genitourinary problems  Blood pressure 161/78, pulse 59, temperature 97.7 F (36.5 C), temperature source Oral, resp. rate 20, SpO2 98.00%.  Neurologic exam shows the patient oriented to time person place. Additionally fascia.Function is normal. is normal Station and gait with normal her strength in arms and legs evidence of atrophy or abnormal movements. Sensation is intact. Deep tendon reflexes are decreased. Pronation amounts are good.  Assessment/Plan MRI scan shows marked stenosis at C4-5 and C6-7. The plan is for a two-level anterior cervical discectomy with fusion and plating.  Reinaldo Meeker, MD 04/30/2013, 2:40 PM

## 2013-04-30 NOTE — Anesthesia Procedure Notes (Signed)
Procedure Name: Intubation Date/Time: 04/30/2013 2:52 PM Performed by: Leona Singleton A Pre-anesthesia Checklist: Patient identified Patient Re-evaluated:Patient Re-evaluated prior to inductionOxygen Delivery Method: Circle system utilized Preoxygenation: Pre-oxygenation with 100% oxygen Intubation Type: IV induction Ventilation: Mask ventilation without difficulty and Oral airway inserted - appropriate to patient size Laryngoscope Size: Hyacinth Meeker and 2 Grade View: Grade I Tube type: Oral Tube size: 7.0 mm Number of attempts: 1 Airway Equipment and Method: Stylet and LTA kit utilized Placement Confirmation: ETT inserted through vocal cords under direct vision,  positive ETCO2 and breath sounds checked- equal and bilateral Secured at: 22 cm Tube secured with: Tape Dental Injury: Teeth and Oropharynx as per pre-operative assessment

## 2013-04-30 NOTE — Preoperative (Signed)
Beta Blockers   Reason not to administer Beta Blockers:Carvedilol 04/30/13 0930

## 2013-04-30 NOTE — Transfer of Care (Signed)
Immediate Anesthesia Transfer of Care Note  Patient: Steve Phillips  Procedure(s) Performed: Procedure(s) with comments: ANTERIOR CERVICAL DECOMPRESSION/DISCECTOMY FUSION 2 LEVELS (N/A) - Cervical four-five, Cervical six-seven anterior cervical decompression fusion with trabecular metal cage and plate  Patient Location: PACU  Anesthesia Type:General  Level of Consciousness: sedated and patient cooperative  Airway & Oxygen Therapy: Patient Spontanous Breathing and Patient connected to face mask oxygen  Post-op Assessment: Report given to PACU RN and Post -op Vital signs reviewed and stable  Post vital signs: Reviewed and stable  Complications: No apparent anesthesia complications

## 2013-04-30 NOTE — Anesthesia Postprocedure Evaluation (Signed)
  Anesthesia Post-op Note  Patient: Steve Phillips  Procedure(s) Performed: Procedure(s) with comments: ANTERIOR CERVICAL DECOMPRESSION/DISCECTOMY FUSION 2 LEVELS (N/A) - Cervical four-five, Cervical six-seven anterior cervical decompression fusion with trabecular metal cage and plate  Patient Location: PACU  Anesthesia Type:General  Level of Consciousness: awake, alert  and oriented  Airway and Oxygen Therapy: Patient Spontanous Breathing and Patient connected to face mask oxygen  Post-op Pain: mild  Post-op Assessment: Post-op Vital signs reviewed  Post-op Vital Signs: Reviewed  Complications: No apparent anesthesia complications

## 2013-05-01 ENCOUNTER — Telehealth: Payer: Self-pay | Admitting: *Deleted

## 2013-05-01 DIAGNOSIS — E1165 Type 2 diabetes mellitus with hyperglycemia: Secondary | ICD-10-CM

## 2013-05-01 LAB — GLUCOSE, CAPILLARY
Glucose-Capillary: 309 mg/dL — ABNORMAL HIGH (ref 70–99)
Glucose-Capillary: 286 mg/dL — ABNORMAL HIGH (ref 70–99)

## 2013-05-01 MED ORDER — GLUCOSE BLOOD VI STRP: ORAL_STRIP | Status: DC

## 2013-05-01 MED ORDER — TAMSULOSIN HCL 0.4 MG PO CAPS
0.4000 mg | ORAL_CAPSULE | Freq: Every day | ORAL | Status: DC
  Administered 2013-05-01: 0.4 mg via ORAL
  Filled 2013-05-01 (×2): qty 1

## 2013-05-01 MED ORDER — PNEUMOCOCCAL VAC POLYVALENT 25 MCG/0.5ML IJ INJ
0.5000 mL | INJECTION | Freq: Once | INTRAMUSCULAR | Status: AC
  Administered 2013-05-01: 0.5 mL via INTRAMUSCULAR
  Filled 2013-05-01: qty 0.5

## 2013-05-01 MED ORDER — PNEUMOCOCCAL VAC POLYVALENT 25 MCG/0.5ML IJ INJ: 0.5000 mL | INJECTION | INTRAMUSCULAR | Status: DC

## 2013-05-01 NOTE — Telephone Encounter (Signed)
Refill Request  Freestyle Strips Test  #100  Use one up to three times daily to check blood sugar  PHARMACY NOTE:  Need new RX every 6 month for medicaid

## 2013-05-01 NOTE — Progress Notes (Signed)
Inpatient Diabetes Program Recommendations  AACE/ADA: New Consensus Statement on Inpatient Glycemic Control (2013)  Target Ranges:  Prepandial:   less than 140 mg/dL      Peak postprandial:   less than 180 mg/dL (1-2 hours)      Critically ill patients:  140 - 180 mg/dL   Hyperglycemia  Inpatient Diabetes Program Recommendations Insulin - Basal: pt takes lantus 80 units bid at home.  Please add 20 units lantus daily or at HS while here.  Thank you, Lenor Coffin, RN, CNS, Diabetes Coordinator [phone])

## 2013-05-01 NOTE — Progress Notes (Signed)
UR COMPLETED  

## 2013-05-01 NOTE — Progress Notes (Signed)
Patient ID: Steve Phillips, male   DOB: Oct 06, 1946, 67 y.o.   MRN: [id number] Subjective: Patient reports feeling great  Objective: Vital signs in last 24 hours: Temp:  [97.4 F (36.3 C)-98.5 F (36.9 C)] 97.4 F (36.3 C) (06/13 1247) Pulse Rate:  [80-98] 80 (06/13 1247) Resp:  [13-18] 18 (06/13 1247) BP: (139-196)/(72-96) 182/79 mmHg (06/13 1247) SpO2:  [93 %-97 %] 93 % (06/13 1247)  Intake/Output from previous day: 06/12 0701 - 06/13 0700 In: 2000 [I.V.:2000] Out: 1010 [Urine:800; Drains:110; Blood:100] Intake/Output this shift: Total I/O In: 360 [P.O.:360] Out: 600 [Urine:600]  Wound:clean and dry  Lab Results: No results found for this basename: WBC, HGB, HCT, PLT,  in the last 72 hours BMET No results found for this basename: NA, K, CL, CO2, GLUCOSE, BUN, CREATININE, CALCIUM,  in the last 72 hours  Studies/Results: Dg Cervical Spine 2-3 Views  04/30/2013   *RADIOLOGY REPORT*  Clinical Data: Post C4 - C5 and C6 - C7 ACDF  CERVICAL SPINE - 2-3 VIEW,DG C-ARM 61-120 MIN  Comparison: Cervical spine MRI - 03/18/2013  Findings:  Two spot intraoperative lateral coned radiographic images of the cervical spine are provided for review.  The second more caudally located radiograph is limited secondary to exclusion of the base of skull.  Post apparent C4 - C5 and C6 - C7 ACDF and intervertebral disc space replacements.  The cranial aspect of the C4 - C5 ACDF hardware does not appear flush with the anterior aspect of the C4 vertebral body, though this may be artifactual secondary to projection.  Enteric and endotracheal tube tips are excluded from view.  Support apparatus is seen about the operative field.  No definite radiopaque foreign body.  IMPRESSION: Post apparent C4 - C5 and C6 - C7 ACDF and intervertebral disc space replacement.   Original Report Authenticated By: Tacey Ruiz, MD   Dg C-arm [phone] Min  04/30/2013   *RADIOLOGY REPORT*  Clinical Data: Post C4 - C5 and C6 - C7 ACDF   CERVICAL SPINE - 2-3 VIEW,DG C-ARM 61-120 MIN  Comparison: Cervical spine MRI - 03/18/2013  Findings:  Two spot intraoperative lateral coned radiographic images of the cervical spine are provided for review.  The second more caudally located radiograph is limited secondary to exclusion of the base of skull.  Post apparent C4 - C5 and C6 - C7 ACDF and intervertebral disc space replacements.  The cranial aspect of the C4 - C5 ACDF hardware does not appear flush with the anterior aspect of the C4 vertebral body, though this may be artifactual secondary to projection.  Enteric and endotracheal tube tips are excluded from view.  Support apparatus is seen about the operative field.  No definite radiopaque foreign body.  IMPRESSION: Post apparent C4 - C5 and C6 - C7 ACDF and intervertebral disc space replacement.   Original Report Authenticated By: Tacey Ruiz, MD    Assessment/Plan: Doing very well. Only issue is some urinary issues most likely due to BPH. Will place foley for today, d/c it tonight, start flomax, and see if things are better for d/c tomorrow.   LOS: 1 day  as above   Reinaldo Meeker, MD 05/01/2013, 1:06 PM

## 2013-05-01 NOTE — Telephone Encounter (Signed)
Rx faxed

## 2013-05-02 LAB — GLUCOSE, CAPILLARY

## 2013-05-02 MED ORDER — HYDROCODONE-ACETAMINOPHEN 5-325 MG PO TABS: 1.0000 | ORAL_TABLET | ORAL | Status: DC | PRN

## 2013-05-02 MED ORDER — PANTOPRAZOLE SODIUM 40 MG PO TBEC: 40.0000 mg | DELAYED_RELEASE_TABLET | Freq: Every day | ORAL | Status: DC

## 2013-05-02 NOTE — Discharge Summary (Signed)
Physician Discharge Summary  Patient ID: Steve Phillips MRN: [id number] DOB/AGE: 69-Dec-1947 69 y.o.  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 *   Discharged Condition: good  Hospital Course: Anterior decompression and fusion C 45 and C 67   Consults: None  Significant Diagnostic Studies: None  Treatments: surgery: Anterior decompression and fusion C 45 and C 67   Discharge Exam: Blood pressure 176/89, pulse 76, temperature 97.6 F (36.4 C), temperature source Oral, resp. rate 18, SpO2 93.00%. Neurologic: Alert and oriented X 3, normal strength and tone. Normal symmetric reflexes. Normal coordination and gait Wound:CDI  Disposition: Home   Future Appointments Provider Department Dept Phone   05/25/2013 3:30 PM Wynona Dove, MD [facility] [phone]       Medication List    STOP taking these medications       clopidogrel 75 MG tablet  Commonly known as:  PLAVIX      TAKE these medications       aspirin 81 MG tablet  Take 243 mg by mouth daily.     carvedilol 25 MG tablet  Commonly known as:  COREG  Take 1 tablet (25 mg total) by mouth 2 (two) times daily.     cyanocobalamin 1000 MCG/ML injection  Commonly known as:  (VITAMIN B-12)  Inject 1 mL (1,000 mcg total) into the muscle every 30 (thirty) days.     DULoxetine 60 MG capsule  Commonly known as:  CYMBALTA  Take 1 capsule (60 mg total) by mouth daily.     glucose blood test strip  Commonly known as:  FREESTYLE TEST STRIPS  Use to check blood sugar up to three times daily     HYDROcodone-acetaminophen 5-325 MG per tablet  Commonly known as:  NORCO/VICODIN  Take 1 tablet by mouth every 8 (eight) hours as needed for pain.     HYDROcodone-acetaminophen 5-325 MG per tablet   Commonly known as:  NORCO/VICODIN  Take 1-2 tablets by mouth every 4 (four) hours as needed for pain.     insulin aspart 100 UNIT/ML injection  Commonly known as:  NOVOLOG FLEXPEN  Inject 25 units before meals per sliding scale. +     isosorbide mononitrate 30 MG 24 hr tablet  Commonly known as:  IMDUR  Take 1 tablet (30 mg total) by mouth daily.     LANTUS 100 UNIT/ML injection  Generic drug:  insulin glargine  INJECT 80 UNITS            SUBCUTANEOUS TWICE DAILY     lisinopril-hydrochlorothiazide 20-12.5 MG per tablet  Commonly known as:  PRINZIDE,ZESTORETIC  Take 1 tablet by mouth daily.     metFORMIN 1000 MG tablet  Commonly known as:  GLUCOPHAGE  Take 1 tablet (1,000 mg total) by mouth 2 (two) times daily.     nitroGLYCERIN 0.4 MG SL tablet  Commonly known as:  NITROSTAT  Place 0.4 mg under the tongue every 5 (five) minutes as needed for chest pain.     pantoprazole 40 MG tablet  Commonly known as:  PROTONIX  Take 1 tablet (40 mg total) by mouth 2 (two) times daily.     pregabalin 75 MG capsule  Commonly known as:  LYRICA  Take 1 capsule (75 mg total) by mouth 2 (two) times daily.  rosuvastatin 10 MG tablet  Commonly known as:  CRESTOR  Take 10 mg by mouth daily.     tiZANidine 4 MG tablet  Commonly known as:  ZANAFLEX  Take 4 mg by mouth every 8 (eight) hours as needed (pain).         Signed: Dorian Heckle, MD 05/02/2013, 9:32 AM

## 2013-05-02 NOTE — Progress Notes (Signed)
Subjective: Patient reports arms better.  Mild discomfort with swallowing.  Objective: Vital signs in last 24 hours: Temp:  [97.4 F (36.3 C)-97.8 F (36.6 C)] 97.6 F (36.4 C) (06/14 0758) Pulse Rate:  [75-81] 76 (06/14 0758) Resp:  [18] 18 (06/14 0758) BP: (153-189)/(69-89) 176/89 mmHg (06/14 0758) SpO2:  [92 %-94 %] 93 % (06/14 0758)  Intake/Output from previous day: 06/13 0701 - 06/14 0700 In: 600 [P.O.:600] Out: 3340 [Urine:3300; Drains:40] Intake/Output this shift: Total I/O In: -  Out: 200 [Urine:200]  Physical Exam: Full strength both upper extremities.  Incision CDI.  Lab Results: No results found for this basename: WBC, HGB, HCT, PLT,  in the last 72 hours BMET No results found for this basename: NA, K, CL, CO2, GLUCOSE, BUN, CREATININE, CALCIUM,  in the last 72 hours  Studies/Results: Dg Cervical Spine 2-3 Views  04/30/2013   *RADIOLOGY REPORT*  Clinical Data: Post C4 - C5 and C6 - C7 ACDF  CERVICAL SPINE - 2-3 VIEW,DG C-ARM 61-120 MIN  Comparison: Cervical spine MRI - 03/18/2013  Findings:  Two spot intraoperative lateral coned radiographic images of the cervical spine are provided for review.  The second more caudally located radiograph is limited secondary to exclusion of the base of skull.  Post apparent C4 - C5 and C6 - C7 ACDF and intervertebral disc space replacements.  The cranial aspect of the C4 - C5 ACDF hardware does not appear flush with the anterior aspect of the C4 vertebral body, though this may be artifactual secondary to projection.  Enteric and endotracheal tube tips are excluded from view.  Support apparatus is seen about the operative field.  No definite radiopaque foreign body.  IMPRESSION: Post apparent C4 - C5 and C6 - C7 ACDF and intervertebral disc space replacement.   Original Report Authenticated By: Tacey Ruiz, MD   Dg C-arm [phone] Min  04/30/2013   *RADIOLOGY REPORT*  Clinical Data: Post C4 - C5 and C6 - C7 ACDF  CERVICAL SPINE - 2-3  VIEW,DG C-ARM 61-120 MIN  Comparison: Cervical spine MRI - 03/18/2013  Findings:  Two spot intraoperative lateral coned radiographic images of the cervical spine are provided for review.  The second more caudally located radiograph is limited secondary to exclusion of the base of skull.  Post apparent C4 - C5 and C6 - C7 ACDF and intervertebral disc space replacements.  The cranial aspect of the C4 - C5 ACDF hardware does not appear flush with the anterior aspect of the C4 vertebral body, though this may be artifactual secondary to projection.  Enteric and endotracheal tube tips are excluded from view.  Support apparatus is seen about the operative field.  No definite radiopaque foreign body.  IMPRESSION: Post apparent C4 - C5 and C6 - C7 ACDF and intervertebral disc space replacement.   Original Report Authenticated By: Tacey Ruiz, MD    Assessment/Plan: Doing well.  D/C home.    LOS: 2 days    Dorian Heckle, MD 05/02/2013, 9:36 AM

## 2013-05-02 NOTE — Progress Notes (Signed)
Pt and wife given D/C instructions with Rx's, verbal understanding given. Pt D/C'd home via wheelchair @ 1055 per MD order. Rema Fendt, RN

## 2013-05-05 ENCOUNTER — Encounter (HOSPITAL_COMMUNITY): Payer: Self-pay | Admitting: Neurosurgery

## 2013-05-18 DIAGNOSIS — M4712 Other spondylosis with myelopathy, cervical region: Secondary | ICD-10-CM | POA: Diagnosis not present

## 2013-05-25 ENCOUNTER — Encounter: Payer: Self-pay | Admitting: Internal Medicine

## 2013-05-25 ENCOUNTER — Ambulatory Visit (INDEPENDENT_AMBULATORY_CARE_PROVIDER_SITE_OTHER): Payer: Medicare Other | Admitting: Internal Medicine

## 2013-05-25 VITALS — BP 128/88 | HR 67 | Temp 98.2°F | Wt 244.0 lb

## 2013-05-25 DIAGNOSIS — I10 Essential (primary) hypertension: Secondary | ICD-10-CM | POA: Diagnosis not present

## 2013-05-25 DIAGNOSIS — M19019 Primary osteoarthritis, unspecified shoulder: Secondary | ICD-10-CM | POA: Diagnosis not present

## 2013-05-25 DIAGNOSIS — E1149 Type 2 diabetes mellitus with other diabetic neurological complication: Secondary | ICD-10-CM | POA: Diagnosis not present

## 2013-05-25 DIAGNOSIS — E538 Deficiency of other specified B group vitamins: Secondary | ICD-10-CM | POA: Diagnosis not present

## 2013-05-25 DIAGNOSIS — E1142 Type 2 diabetes mellitus with diabetic polyneuropathy: Secondary | ICD-10-CM | POA: Diagnosis not present

## 2013-05-25 MED ORDER — NITROGLYCERIN 0.4 MG SL SUBL: 0.4000 mg | SUBLINGUAL_TABLET | SUBLINGUAL | Status: AC | PRN

## 2013-05-25 MED ORDER — INSULIN ASPART 100 UNIT/ML ~~LOC~~ SOLN: SUBCUTANEOUS | Status: DC

## 2013-05-25 MED ORDER — TAMSULOSIN HCL 0.4 MG PO CAPS: 0.4000 mg | ORAL_CAPSULE | Freq: Every day | ORAL | Status: DC

## 2013-05-25 MED ORDER — CYANOCOBALAMIN 1000 MCG/ML IJ SOLN
1000.0000 ug | Freq: Once | INTRAMUSCULAR | Status: AC
  Administered 2013-05-25: 1000 ug via INTRAMUSCULAR

## 2013-05-25 NOTE — Progress Notes (Signed)
Subjective:    Patient ID: Steve Phillips, male    DOB: January 24, 1946, 67 y.o.   MRN: [id number]  HPI 67 year old male with history of diabetes, hypertension, hyperlipidemia, obesity presents for followup after recent cervical spine laminectomy. He reports he is generally feeling well. Pain in his neck and right arm numbness have improved. He notes some dysphagia after surgery, however this is gradually improving. He continues to have right shoulder pain which is relatively severe. This is described as aching which does not generally radiate. It limits his range of motion of his right arm.  In regards to diabetes, he reports blood sugars have been labile. He notes he has recently been on prednisone after laminectomy. He did not bring a record of blood sugars today. He is compliant with his medications. He is planning to get back in the pool for water walking this week.  Outpatient Encounter Prescriptions as of 05/25/2013  Medication Sig Dispense Refill  . aspirin 81 MG tablet Take 243 mg by mouth daily.       . carvedilol (COREG) 25 MG tablet Take 1 tablet (25 mg total) by mouth 2 (two) times daily.  60 tablet  11  . cyanocobalamin (,VITAMIN B-12,) 1000 MCG/ML injection Inject 1 mL (1,000 mcg total) into the muscle every 30 (thirty) days.  30 mL  3  . DULoxetine (CYMBALTA) 60 MG capsule Take 1 capsule (60 mg total) by mouth daily.  90 capsule  3  . glucose blood (FREESTYLE TEST STRIPS) test strip Use to check blood sugar up to three times daily  100 each  11  . HYDROcodone-acetaminophen (NORCO/VICODIN) 5-325 MG per tablet Take 1 tablet by mouth every 8 (eight) hours as needed for pain.  90 tablet  0  . HYDROcodone-acetaminophen (NORCO/VICODIN) 5-325 MG per tablet Take 1-2 tablets by mouth every 4 (four) hours as needed for pain.  60 tablet  0  . insulin aspart (NOVOLOG FLEXPEN) 100 UNIT/ML injection Inject 25 units before meals per sliding scale. +  9 vial  3  . isosorbide mononitrate (IMDUR) 30 MG 24 hr  tablet Take 1 tablet (30 mg total) by mouth daily.  90 tablet  4  . LANTUS 100 UNIT/ML injection INJECT 80 UNITS            SUBCUTANEOUS TWICE DAILY  150 mL  3  . lisinopril-hydrochlorothiazide (PRINZIDE,ZESTORETIC) 20-12.5 MG per tablet Take 1 tablet by mouth daily.      . metFORMIN (GLUCOPHAGE) 1000 MG tablet Take 1 tablet (1,000 mg total) by mouth 2 (two) times daily.  180 tablet  3  . nitroGLYCERIN (NITROSTAT) 0.4 MG SL tablet Place 1 tablet (0.4 mg total) under the tongue every 5 (five) minutes as needed for chest pain.  30 tablet  6  . pantoprazole (PROTONIX) 40 MG tablet Take 1 tablet (40 mg total) by mouth 2 (two) times daily.  180 tablet  3  . pregabalin (LYRICA) 75 MG capsule Take 1 capsule (75 mg total) by mouth 2 (two) times daily.  180 capsule  2  . rosuvastatin (CRESTOR) 10 MG tablet Take 10 mg by mouth daily.      . tamsulosin (FLOMAX) 0.4 MG CAPS Take 1 capsule (0.4 mg total) by mouth daily after breakfast.  90 capsule  4  . tiZANidine (ZANAFLEX) 4 MG tablet Take 4 mg by mouth every 8 (eight) hours as needed (pain).       No facility-administered encounter medications on file as of 05/25/2013.  BP 128/88  Pulse 67  Temp(Src) 98.2 F (36.8 C) (Oral)  Wt 244 lb (110.678 kg)  BMI 33.09 kg/m2  SpO2 97%  Review of Systems  Constitutional: Negative for fever, chills, activity change, appetite change, fatigue and unexpected weight change.  Eyes: Negative for visual disturbance.  Respiratory: Negative for cough and shortness of breath.   Cardiovascular: Negative for chest pain, palpitations and leg swelling.  Gastrointestinal: Negative for abdominal pain and abdominal distention.  Genitourinary: Negative for dysuria, urgency and difficulty urinating.  Musculoskeletal: Negative for arthralgias and gait problem.  Skin: Negative for color change and rash.  Hematological: Negative for adenopathy.  Psychiatric/Behavioral: Negative for sleep disturbance and dysphoric mood. The patient  is not nervous/anxious.        Objective:   Physical Exam  Constitutional: He is oriented to person, place, and time. He appears well-developed and well-nourished. No distress.  HENT:  Head: Normocephalic and atraumatic.  Right Ear: External ear normal.  Left Ear: External ear normal.  Nose: Nose normal.  Mouth/Throat: Oropharynx is clear and moist. No oropharyngeal exudate.  Eyes: Conjunctivae and EOM are normal. Pupils are equal, round, and reactive to light. Right eye exhibits no discharge. Left eye exhibits no discharge. No scleral icterus.  Neck: Normal range of motion. Neck supple. No tracheal deviation present. No thyromegaly present.    Cardiovascular: Normal rate, regular rhythm and normal heart sounds.  Exam reveals no gallop and no friction rub.   No murmur heard. Pulmonary/Chest: Effort normal and breath sounds normal. No respiratory distress. He has no wheezes. He has no rales. He exhibits no tenderness.  Musculoskeletal: He exhibits no edema.       Right shoulder: He exhibits decreased range of motion, tenderness and pain. He exhibits no bony tenderness.  Lymphadenopathy:    He has no cervical adenopathy.  Neurological: He is alert and oriented to person, place, and time. No cranial nerve deficit. Coordination normal.  Skin: Skin is warm and dry. No rash noted. He is not diaphoretic. No erythema. No pallor.  Psychiatric: He has a normal mood and affect. His behavior is normal. Judgment and thought content normal.          Assessment & Plan:

## 2013-05-25 NOTE — Assessment & Plan Note (Signed)
Patient reports blood sugars have been labile recently with recent use of prednisone. Will check A1c with labs today. Continue current medications.

## 2013-05-25 NOTE — Assessment & Plan Note (Signed)
BP Readings from Last 3 Encounters:  05/25/13 128/88  05/02/13 176/89  05/02/13 176/89   Blood pressure well-controlled on current medication. Will continue.

## 2013-05-25 NOTE — Assessment & Plan Note (Signed)
Encouraged to followup with orthopedics given persistent symptoms of right shoulder pain.

## 2013-05-26 LAB — COMPREHENSIVE METABOLIC PANEL
ALT: 20 U/L (ref 0–53)
Albumin: 3.5 g/dL (ref 3.5–5.2)
Alkaline Phosphatase: 57 U/L (ref 39–117)
CO2: 31 mEq/L (ref 19–32)
GFR: 81.59 mL/min (ref >60.00)
Potassium: 3.8 mEq/L (ref 3.5–5.1)
Sodium: 137 mEq/L (ref 135–145)
Total Bilirubin: 0.7 mg/dL (ref 0.3–1.2)
Total Protein: 6.3 g/dL (ref 6.0–8.3)

## 2013-06-11 ENCOUNTER — Ambulatory Visit: Payer: Self-pay | Admitting: Internal Medicine

## 2013-06-11 DIAGNOSIS — I2510 Atherosclerotic heart disease of native coronary artery without angina pectoris: Secondary | ICD-10-CM | POA: Diagnosis not present

## 2013-06-11 DIAGNOSIS — E1149 Type 2 diabetes mellitus with other diabetic neurological complication: Secondary | ICD-10-CM | POA: Diagnosis not present

## 2013-06-11 DIAGNOSIS — Z7902 Long term (current) use of antithrombotics/antiplatelets: Secondary | ICD-10-CM | POA: Diagnosis not present

## 2013-06-11 DIAGNOSIS — L03119 Cellulitis of unspecified part of limb: Secondary | ICD-10-CM | POA: Diagnosis not present

## 2013-06-11 DIAGNOSIS — K219 Gastro-esophageal reflux disease without esophagitis: Secondary | ICD-10-CM | POA: Diagnosis not present

## 2013-06-11 DIAGNOSIS — Z9889 Other specified postprocedural states: Secondary | ICD-10-CM | POA: Diagnosis not present

## 2013-06-11 DIAGNOSIS — Z79899 Other long term (current) drug therapy: Secondary | ICD-10-CM | POA: Diagnosis not present

## 2013-06-11 DIAGNOSIS — I10 Essential (primary) hypertension: Secondary | ICD-10-CM | POA: Diagnosis not present

## 2013-06-16 ENCOUNTER — Telehealth: Payer: Self-pay | Admitting: Internal Medicine

## 2013-06-16 ENCOUNTER — Other Ambulatory Visit: Payer: Self-pay | Admitting: Internal Medicine

## 2013-06-16 NOTE — Telephone Encounter (Signed)
Spoke with Mrs Sinning, she stated he had a foot exam done in May of this year by Dr. Ether Griffins at [facility]. Not sure if he has had an eye exam.

## 2013-06-16 NOTE — Telephone Encounter (Signed)
Can you make sure pt gets this message?  Steve Phillips,  In review of your records, we are lacking documentation of your last foot exam and eye exam. Can you please supply [place] with those dates? Also, have you recently seen Dr. Renae Fickle in [facility]? Thank you! Kellie Shropshire

## 2013-06-16 NOTE — Telephone Encounter (Signed)
OK. I will update foot exam. Needs eye exam scheduled asap.

## 2013-06-18 NOTE — Telephone Encounter (Signed)
Informed patient wife and she verbalized understanding.  

## 2013-07-03 ENCOUNTER — Other Ambulatory Visit: Payer: Self-pay | Admitting: *Deleted

## 2013-07-03 DIAGNOSIS — E1165 Type 2 diabetes mellitus with hyperglycemia: Secondary | ICD-10-CM

## 2013-07-03 MED ORDER — METFORMIN HCL 1000 MG PO TABS: 1000.0000 mg | ORAL_TABLET | Freq: Two times a day (BID) | ORAL | Status: DC

## 2013-07-07 ENCOUNTER — Other Ambulatory Visit: Payer: Self-pay | Admitting: Internal Medicine

## 2013-08-19 ENCOUNTER — Ambulatory Visit (INDEPENDENT_AMBULATORY_CARE_PROVIDER_SITE_OTHER): Payer: Medicare Other | Admitting: *Deleted

## 2013-08-19 DIAGNOSIS — Z23 Encounter for immunization: Secondary | ICD-10-CM

## 2013-08-19 DIAGNOSIS — E538 Deficiency of other specified B group vitamins: Secondary | ICD-10-CM

## 2013-08-22 ENCOUNTER — Other Ambulatory Visit: Payer: Self-pay | Admitting: Internal Medicine

## 2013-08-24 NOTE — Telephone Encounter (Signed)
Eprescribed.

## 2013-08-28 MED ORDER — CYANOCOBALAMIN 1000 MCG/ML IJ SOLN
1000.0000 ug | Freq: Once | INTRAMUSCULAR | Status: AC
  Administered 2013-08-19: 1000 ug via INTRAMUSCULAR

## 2013-08-31 ENCOUNTER — Encounter: Payer: Self-pay | Admitting: Internal Medicine

## 2013-08-31 ENCOUNTER — Ambulatory Visit: Payer: Medicare Other | Admitting: Internal Medicine

## 2013-08-31 ENCOUNTER — Ambulatory Visit (INDEPENDENT_AMBULATORY_CARE_PROVIDER_SITE_OTHER): Payer: Medicare Other | Admitting: Internal Medicine

## 2013-08-31 ENCOUNTER — Ambulatory Visit: Payer: Self-pay | Admitting: Internal Medicine

## 2013-08-31 VITALS — BP 142/90 | HR 69 | Temp 97.7°F | Wt 255.0 lb

## 2013-08-31 DIAGNOSIS — E1149 Type 2 diabetes mellitus with other diabetic neurological complication: Secondary | ICD-10-CM

## 2013-08-31 DIAGNOSIS — L03116 Cellulitis of left lower limb: Secondary | ICD-10-CM | POA: Insufficient documentation

## 2013-08-31 DIAGNOSIS — Z981 Arthrodesis status: Secondary | ICD-10-CM | POA: Diagnosis not present

## 2013-08-31 DIAGNOSIS — E1142 Type 2 diabetes mellitus with diabetic polyneuropathy: Secondary | ICD-10-CM

## 2013-08-31 DIAGNOSIS — L02419 Cutaneous abscess of limb, unspecified: Secondary | ICD-10-CM | POA: Diagnosis not present

## 2013-08-31 DIAGNOSIS — R51 Headache: Secondary | ICD-10-CM

## 2013-08-31 DIAGNOSIS — M542 Cervicalgia: Secondary | ICD-10-CM | POA: Diagnosis not present

## 2013-08-31 DIAGNOSIS — I10 Essential (primary) hypertension: Secondary | ICD-10-CM

## 2013-08-31 DIAGNOSIS — S0990XA Unspecified injury of head, initial encounter: Secondary | ICD-10-CM | POA: Diagnosis not present

## 2013-08-31 LAB — COMPREHENSIVE METABOLIC PANEL
ALT: 16 U/L (ref 0–53)
CO2: 23 mEq/L (ref 19–32)
Calcium: 8.6 mg/dL (ref 8.4–10.5)
Chloride: 105 mEq/L (ref 96–112)
GFR: 98.96 mL/min (ref >60.00)
Glucose, Bld: 202 mg/dL — ABNORMAL HIGH (ref 70–99)
Sodium: 138 mEq/L (ref 135–145)
Total Protein: 6.5 g/dL (ref 6.0–8.3)

## 2013-08-31 LAB — HEMOGLOBIN A1C: Hgb A1c MFr Bld: 11.5 % — ABNORMAL HIGH (ref 4.6–6.5)

## 2013-08-31 MED ORDER — TIZANIDINE HCL 4 MG PO TABS: 4.0000 mg | ORAL_TABLET | Freq: Three times a day (TID) | ORAL | Status: DC | PRN

## 2013-08-31 MED ORDER — HYDROCODONE-ACETAMINOPHEN 5-325 MG PO TABS: 1.0000 | ORAL_TABLET | Freq: Three times a day (TID) | ORAL | Status: DC | PRN

## 2013-08-31 MED ORDER — DOXYCYCLINE HYCLATE 100 MG PO TABS: 100.0000 mg | ORAL_TABLET | Freq: Two times a day (BID) | ORAL | Status: DC

## 2013-08-31 NOTE — Assessment & Plan Note (Signed)
Early cellulitis of skin tear left leg. Will start doxycycline. We'll monitor closely. If any worsening induration, patient or wife will call or return to clinic.

## 2013-08-31 NOTE — Progress Notes (Signed)
Subjective:    Patient ID: Steve Phillips, male    DOB: Apr 10, 1946, 67 y.o.   MRN: [id number]  HPI 67 year old male with history of diabetes, coronary artery disease, obesity, hypertension, peripheral neuropathy presents for followup. His wife reports that recent blood sugars have been elevated. He has not been compliant with diet. He has been compliant with Lantus in general. Most sugars are over 200.  He is concerned today about severe headache. He reports that last Thursday he was trying to sit down in a hammock when he fell backward hitting his head on a metal fence post. He was unable to get up for over 20 minutes. Since that time he has felt dizzy described as lightheaded and has had severe diffuse headache. He has been taking hydrocodone with minimal improvement. He denies nausea or visual changes. He did not lose consciousness. He also reports diffuse neck pain. Notably, he recently underwent cervical spine fusion.  Outpatient Encounter Prescriptions as of 08/31/2013  Medication Sig Dispense Refill  . carvedilol (COREG) 25 MG tablet TAKE ONE TABLET BY MOUTH TWICE DAILY  60 tablet  5  . DULoxetine (CYMBALTA) 60 MG capsule Take 1 capsule (60 mg total) by mouth daily.  90 capsule  3  . glucose blood (FREESTYLE TEST STRIPS) test strip Use to check blood sugar up to three times daily  100 each  11  . HYDROcodone-acetaminophen (NORCO/VICODIN) 5-325 MG per tablet Take 1 tablet by mouth every 8 (eight) hours as needed for pain.  90 tablet  0  . insulin aspart (NOVOLOG FLEXPEN) 100 UNIT/ML injection Inject 25 units before meals per sliding scale. +  9 vial  3  . isosorbide mononitrate (IMDUR) 30 MG 24 hr tablet Take 1 tablet (30 mg total) by mouth daily.  90 tablet  4  . LANTUS 100 UNIT/ML injection INJECT 80 UNITS            SUBCUTANEOUSLY TWO TIMES A DAY  150 mL  3  . lisinopril-hydrochlorothiazide (PRINZIDE,ZESTORETIC) 20-12.5 MG per tablet Take 1 tablet by mouth daily.      . metFORMIN  (GLUCOPHAGE) 1000 MG tablet Take 1 tablet (1,000 mg total) by mouth 2 (two) times daily.  180 tablet  1  . nitroGLYCERIN (NITROSTAT) 0.4 MG SL tablet Place 1 tablet (0.4 mg total) under the tongue every 5 (five) minutes as needed for chest pain.  30 tablet  6  . pantoprazole (PROTONIX) 40 MG tablet Take 1 tablet (40 mg total) by mouth 2 (two) times daily.  180 tablet  3  . pregabalin (LYRICA) 75 MG capsule Take 1 capsule (75 mg total) by mouth 2 (two) times daily.  180 capsule  2  . rosuvastatin (CRESTOR) 10 MG tablet Take 10 mg by mouth daily.      . tamsulosin (FLOMAX) 0.4 MG CAPS Take 1 capsule (0.4 mg total) by mouth daily after breakfast.  90 capsule  4  . tiZANidine (ZANAFLEX) 4 MG tablet Take 1 tablet (4 mg total) by mouth every 8 (eight) hours as needed (pain).  90 tablet  1   No facility-administered encounter medications on file as of 08/31/2013.   BP 142/90  Pulse 69  Temp(Src) 97.7 F (36.5 C) (Oral)  Wt 255 lb (115.667 kg)  BMI 34.58 kg/m2  SpO2 97%  Review of Systems  Constitutional: Negative for fever, chills, activity change, appetite change, fatigue and unexpected weight change.  Eyes: Negative for visual disturbance.  Respiratory: Negative for cough and shortness of  breath.   Cardiovascular: Negative for chest pain, palpitations and leg swelling.  Gastrointestinal: Negative for abdominal pain and abdominal distention.  Genitourinary: Negative for dysuria, urgency and difficulty urinating.  Musculoskeletal: Positive for arthralgias, gait problem and myalgias.  Skin: Negative for color change and rash.  Neurological: Positive for dizziness and headaches.  Hematological: Negative for adenopathy.  Psychiatric/Behavioral: Negative for sleep disturbance and dysphoric mood. The patient is not nervous/anxious.        Objective:   Physical Exam  Constitutional: He is oriented to person, place, and time. He appears well-developed and well-nourished. No distress.  HENT:   Head: Normocephalic and atraumatic.  Right Ear: External ear normal.  Left Ear: External ear normal.  Nose: Nose normal.  Mouth/Throat: Oropharynx is clear and moist. No oropharyngeal exudate.  Eyes: Conjunctivae and EOM are normal. Pupils are equal, round, and reactive to light. Right eye exhibits no discharge. Left eye exhibits no discharge. No scleral icterus.  Neck: Normal range of motion. Neck supple. No tracheal deviation present. No thyromegaly present.  Cardiovascular: Normal rate, regular rhythm and normal heart sounds.  Exam reveals no gallop and no friction rub.   No murmur heard. Pulmonary/Chest: Effort normal and breath sounds normal. No respiratory distress. He has no wheezes. He has no rales. He exhibits no tenderness.  Musculoskeletal: Normal range of motion. He exhibits no edema.       Cervical back: He exhibits tenderness and pain. He exhibits normal range of motion, no bony tenderness, no edema and no deformity.  Lymphadenopathy:    He has no cervical adenopathy.  Neurological: He is alert and oriented to person, place, and time. No cranial nerve deficit. Coordination normal.  Skin: Skin is warm and dry. No rash noted. He is not diaphoretic. No erythema. No pallor.  Psychiatric: He has a normal mood and affect. His behavior is normal. Judgment and thought content normal.          Assessment & Plan:

## 2013-08-31 NOTE — Assessment & Plan Note (Signed)
Lab Results  Component Value Date   HGBA1C 11.5* 08/31/2013   Very poor control of blood sugars. Patient's wife reports that he is noncompliant with diet. Homero Fellers discussion today about risk of uncontrolled blood sugars including heart attack and death. Will set up referral to endocrinology to see if any alternative medications might be helpful.

## 2013-08-31 NOTE — Assessment & Plan Note (Signed)
Severe persistent headache after falling backwards from a hammock onto a metal fence post. Will get CT of the head and neck given recent cervical spine surgery.

## 2013-08-31 NOTE — Assessment & Plan Note (Signed)
BP Readings from Last 3 Encounters:  08/31/13 142/90  05/25/13 128/88  05/02/13 176/89   BP generally well controlled last few visits. Will continue current medications.

## 2013-09-01 ENCOUNTER — Telehealth: Payer: Self-pay | Admitting: Emergency Medicine

## 2013-09-01 NOTE — Telephone Encounter (Signed)
No answer or voicemail to leave message 

## 2013-09-01 NOTE — Telephone Encounter (Signed)
Called patient to make him aware of the apt scheduled with Dr. Tedd Sias on 10/19/13 @ 230. Arriving @ 215. Pt cannot switch MD's, pt already est with [facility] in 2013. Patient was inquiring about CT scan he was sent over for. Please advise the results of this scan.

## 2013-09-01 NOTE — Telephone Encounter (Signed)
CT of the head and neck were both normal. Has headache improved?

## 2013-09-02 DIAGNOSIS — E1142 Type 2 diabetes mellitus with diabetic polyneuropathy: Secondary | ICD-10-CM | POA: Diagnosis not present

## 2013-09-02 DIAGNOSIS — E1149 Type 2 diabetes mellitus with other diabetic neurological complication: Secondary | ICD-10-CM | POA: Diagnosis not present

## 2013-09-02 DIAGNOSIS — B351 Tinea unguium: Secondary | ICD-10-CM | POA: Diagnosis not present

## 2013-09-02 NOTE — Telephone Encounter (Signed)
Spoke with patient wife, state he is still having the headaches and the Vicodin is not helping at all. He did not began having the headaches until he fell.

## 2013-09-02 NOTE — Telephone Encounter (Signed)
Needs to be seen

## 2013-09-02 NOTE — Telephone Encounter (Signed)
Spoke with patient wife, appointment scheduled for Friday at 9

## 2013-09-03 ENCOUNTER — Encounter: Payer: Self-pay | Admitting: *Deleted

## 2013-09-04 ENCOUNTER — Ambulatory Visit (INDEPENDENT_AMBULATORY_CARE_PROVIDER_SITE_OTHER): Payer: Medicare Other | Admitting: Internal Medicine

## 2013-09-04 ENCOUNTER — Emergency Department: Payer: Self-pay | Admitting: Emergency Medicine

## 2013-09-04 ENCOUNTER — Encounter: Payer: Self-pay | Admitting: Internal Medicine

## 2013-09-04 VITALS — BP 156/90 | HR 63 | Temp 97.6°F | Wt 154.0 lb

## 2013-09-04 DIAGNOSIS — R972 Elevated prostate specific antigen [PSA]: Secondary | ICD-10-CM

## 2013-09-04 DIAGNOSIS — R110 Nausea: Secondary | ICD-10-CM | POA: Diagnosis not present

## 2013-09-04 DIAGNOSIS — Z125 Encounter for screening for malignant neoplasm of prostate: Secondary | ICD-10-CM

## 2013-09-04 DIAGNOSIS — R51 Headache: Secondary | ICD-10-CM | POA: Diagnosis not present

## 2013-09-04 DIAGNOSIS — I10 Essential (primary) hypertension: Secondary | ICD-10-CM | POA: Diagnosis not present

## 2013-09-04 DIAGNOSIS — F0781 Postconcussional syndrome: Secondary | ICD-10-CM | POA: Diagnosis not present

## 2013-09-04 LAB — COMPREHENSIVE METABOLIC PANEL WITH GFR
Albumin: 3.1 g/dL — ABNORMAL LOW
Alkaline Phosphatase: 72 U/L
Anion Gap: 13
BUN: 10 mg/dL
Bilirubin,Total: 0.5 mg/dL
Calcium, Total: 8.6 mg/dL
Chloride: 107 mmol/L
Co2: 21 mmol/L
Creatinine: 0.85 mg/dL
EGFR (African American): >60
EGFR (Non-African Amer.): >60
Glucose: 174 mg/dL — ABNORMAL HIGH
Osmolality: 284
Potassium: 4 mmol/L
SGOT(AST): 31 U/L
SGPT (ALT): 21 U/L
Sodium: 141 mmol/L
Total Protein: 5.8 g/dL — ABNORMAL LOW

## 2013-09-04 LAB — CBC
HCT: 39.2 % — ABNORMAL LOW
HGB: 12.9 g/dL — ABNORMAL LOW
MCH: 23.5 pg — ABNORMAL LOW
MCHC: 33 g/dL
MCV: 71 fL — ABNORMAL LOW
Platelet: 200 x10 3/mm 3
RBC: 5.52 x10 6/mm 3
RDW: 17.2 % — ABNORMAL HIGH
WBC: 10.8 x10 3/mm 3 — ABNORMAL HIGH

## 2013-09-04 LAB — URINALYSIS, COMPLETE
Blood: NEGATIVE
Ketone: NEGATIVE
Protein: 100
RBC,UR: <1 /HPF (ref 0–5)
Specific Gravity: 1.014 (ref 1.003–1.030)
WBC UR: 1 /HPF (ref 0–5)

## 2013-09-04 LAB — TROPONIN I: Troponin-I: <0.02 ng/mL

## 2013-09-04 NOTE — Progress Notes (Signed)
Subjective:    Patient ID: Steve Phillips, male    DOB: 1946-08-29, 67 y.o.   MRN: [id number]  HPI 66 year old male with history of diabetes, coronary artery disease, neuropathy percent for followup of recent headache. 8 days ago, he attempted to sit in a hammock and fell backward onto the ground hitting his head on a metal post. No LOC during this event. Since that time, he has had severe diffuse headache. He was seen earlier this week and underwent CT head and neck which showed no acute process. However, throughout the week headache has been severe. It has kept him from sleeping. It has not responded to treatment with Zanaflex or hydrocodone. He denies visual changes. He denies any focal neurologic changes. He reports headache is somewhat improved by lying flat.  Outpatient Encounter Prescriptions as of 09/04/2013  Medication Sig Dispense Refill  . aspirin 81 MG tablet Take 81 mg by mouth 3 (three) times daily.      . carvedilol (COREG) 25 MG tablet TAKE ONE TABLET BY MOUTH TWICE DAILY  60 tablet  5  . doxycycline (VIBRA-TABS) 100 MG tablet Take 1 tablet (100 mg total) by mouth 2 (two) times daily.  20 tablet  0  . DULoxetine (CYMBALTA) 60 MG capsule Take 1 capsule (60 mg total) by mouth daily.  90 capsule  3  . glucose blood (FREESTYLE TEST STRIPS) test strip Use to check blood sugar up to three times daily  100 each  11  . HYDROcodone-acetaminophen (NORCO/VICODIN) 5-325 MG per tablet Take 1 tablet by mouth every 8 (eight) hours as needed for pain.  90 tablet  0  . insulin aspart (NOVOLOG FLEXPEN) 100 UNIT/ML injection Inject 25 units before meals per sliding scale. +  9 vial  3  . isosorbide mononitrate (IMDUR) 30 MG 24 hr tablet Take 1 tablet (30 mg total) by mouth daily.  90 tablet  4  . LANTUS 100 UNIT/ML injection INJECT 80 UNITS            SUBCUTANEOUSLY TWO TIMES A DAY  150 mL  3  . lisinopril-hydrochlorothiazide (PRINZIDE,ZESTORETIC) 20-12.5 MG per tablet Take 1 tablet by mouth daily.       . metFORMIN (GLUCOPHAGE) 1000 MG tablet Take 1 tablet (1,000 mg total) by mouth 2 (two) times daily.  180 tablet  1  . nitroGLYCERIN (NITROSTAT) 0.4 MG SL tablet Place 1 tablet (0.4 mg total) under the tongue every 5 (five) minutes as needed for chest pain.  30 tablet  6  . pantoprazole (PROTONIX) 40 MG tablet Take 1 tablet (40 mg total) by mouth 2 (two) times daily.  180 tablet  3  . pregabalin (LYRICA) 75 MG capsule Take 1 capsule (75 mg total) by mouth 2 (two) times daily.  180 capsule  2  . rosuvastatin (CRESTOR) 10 MG tablet Take 10 mg by mouth daily.      . tamsulosin (FLOMAX) 0.4 MG CAPS Take 1 capsule (0.4 mg total) by mouth daily after breakfast.  90 capsule  4  . tiZANidine (ZANAFLEX) 4 MG tablet Take 1 tablet (4 mg total) by mouth every 8 (eight) hours as needed (pain).  90 tablet  1   No facility-administered encounter medications on file as of 09/04/2013.   BP 156/90  Pulse 63  Temp(Src) 97.6 F (36.4 C) (Oral)  Wt 154 lb (69.854 kg)  BMI 20.88 kg/m2  SpO2 95%  Review of Systems  Constitutional: Positive for fatigue. Negative for fever and chills.  Respiratory: Negative for shortness of breath.   Cardiovascular: Negative for chest pain.  Gastrointestinal: Negative for abdominal pain.  Musculoskeletal: Positive for arthralgias, back pain, myalgias, neck pain and neck stiffness (pain with rotation of head).  Skin: Negative for color change.  Neurological: Positive for weakness (chronic BLE) and headaches. Negative for dizziness, tremors, seizures, syncope, facial asymmetry, speech difficulty, light-headedness and numbness.  Psychiatric/Behavioral: Negative for dysphoric mood and decreased concentration. The patient is not nervous/anxious.        Objective:   Physical Exam  Constitutional: He is oriented to person, place, and time. He appears well-developed and well-nourished. No distress.  HENT:  Head: Normocephalic and atraumatic.  Right Ear: External ear normal.   Left Ear: External ear normal.  Nose: Nose normal.  Mouth/Throat: Oropharynx is clear and moist. No oropharyngeal exudate.  Eyes: Conjunctivae and EOM are normal. Pupils are equal, round, and reactive to light. Right eye exhibits no discharge. Left eye exhibits no discharge. No scleral icterus.  Neck: Normal range of motion. Neck supple. No tracheal deviation present. No thyromegaly present.  Cardiovascular: Normal rate, regular rhythm and normal heart sounds.  Exam reveals no gallop and no friction rub.   No murmur heard. Pulmonary/Chest: Effort normal and breath sounds normal. No respiratory distress. He has no wheezes. He has no rales. He exhibits no tenderness.  Musculoskeletal: Normal range of motion. He exhibits no edema.  Lymphadenopathy:    He has no cervical adenopathy.  Neurological: He is alert and oriented to person, place, and time. No cranial nerve deficit. Coordination normal.  Skin: Skin is warm and dry. No rash noted. He is not diaphoretic. No erythema. No pallor.  Psychiatric: He has a normal mood and affect. His behavior is normal. Judgment and thought content normal.          Assessment & Plan:

## 2013-09-04 NOTE — Assessment & Plan Note (Addendum)
Severe persistent headache after fall backwards, hitting head on metal post. CT head and c-spine were normal. No improvement in headache pain with Zanaflex, Hydrocodone. Symptoms concerning for [facility] versus concussion. Discussed need for further evaluation, likely with lumbar puncture. Discussed via staff with neurology. Recommend ED evaluation for lumbar puncture and further pain management.

## 2013-09-05 LAB — PSA, TOTAL AND FREE
PSA, Free Pct: 24 % (ref >25)
PSA, Free: 1.21 ng/mL
PSA: 5.07 ng/mL — ABNORMAL HIGH (ref <=4.00)

## 2013-09-07 ENCOUNTER — Telehealth: Payer: Self-pay | Admitting: Internal Medicine

## 2013-09-07 NOTE — Telephone Encounter (Signed)
Can you call to follow up his recent ED visit at [facility]? Did they perform a lumbar puncture?

## 2013-09-08 NOTE — Telephone Encounter (Signed)
He needs to be seen by neurology this week. Amber - Can we get him in at [place]?

## 2013-09-08 NOTE — Telephone Encounter (Signed)
Pts wife aware of apt.

## 2013-09-08 NOTE — Telephone Encounter (Signed)
Pt to see [facility] tomorrow 09/09/13 @ 8am.

## 2013-09-08 NOTE — Telephone Encounter (Signed)
Spoke with Mrs. Greenup and she stated they were in the hospital until 11:00 that night and they did another CT scan. They did tell him he had a concussion and they kept him there until his BP went down. Did not do the lumbar puncture and he woke up this morning with a headache. They gave him 20 Percocet which will help for a little bit then it will come back.

## 2013-09-08 NOTE — Addendum Note (Signed)
Addended by: Ronna Polio A on: 09/08/2013 10:44 AM   Modules accepted: Orders

## 2013-09-09 DIAGNOSIS — S060X0A Concussion without loss of consciousness, initial encounter: Secondary | ICD-10-CM | POA: Diagnosis not present

## 2013-09-09 DIAGNOSIS — M531 Cervicobrachial syndrome: Secondary | ICD-10-CM | POA: Diagnosis not present

## 2013-09-09 DIAGNOSIS — R269 Unspecified abnormalities of gait and mobility: Secondary | ICD-10-CM | POA: Diagnosis not present

## 2013-09-09 DIAGNOSIS — R079 Chest pain, unspecified: Secondary | ICD-10-CM | POA: Diagnosis not present

## 2013-09-11 DIAGNOSIS — R209 Unspecified disturbances of skin sensation: Secondary | ICD-10-CM | POA: Diagnosis not present

## 2013-09-14 ENCOUNTER — Encounter: Payer: Self-pay | Admitting: Internal Medicine

## 2013-09-14 ENCOUNTER — Encounter: Payer: Self-pay | Admitting: Emergency Medicine

## 2013-09-15 ENCOUNTER — Encounter: Payer: Self-pay | Admitting: *Deleted

## 2013-09-16 ENCOUNTER — Ambulatory Visit (INDEPENDENT_AMBULATORY_CARE_PROVIDER_SITE_OTHER): Payer: Medicare Other | Admitting: Internal Medicine

## 2013-09-16 ENCOUNTER — Encounter: Payer: Self-pay | Admitting: Internal Medicine

## 2013-09-16 VITALS — BP 160/96 | HR 62 | Temp 97.8°F | Wt 250.0 lb

## 2013-09-16 DIAGNOSIS — IMO0002 Reserved for concepts with insufficient information to code with codable children: Secondary | ICD-10-CM

## 2013-09-16 DIAGNOSIS — S2232XA Fracture of one rib, left side, initial encounter for closed fracture: Secondary | ICD-10-CM | POA: Insufficient documentation

## 2013-09-16 DIAGNOSIS — R51 Headache: Secondary | ICD-10-CM

## 2013-09-16 DIAGNOSIS — S2232XS Fracture of one rib, left side, sequela: Secondary | ICD-10-CM

## 2013-09-16 MED ORDER — BUTALBITAL-APAP-CAFFEINE 50-325-40 MG PO TABS: 1.0000 | ORAL_TABLET | Freq: Four times a day (QID) | ORAL | Status: DC | PRN

## 2013-09-16 NOTE — Assessment & Plan Note (Signed)
Persistent headache related to head injury and concussion. No improvement with Tylenol, ibuprofen, Zanaflex. No improvement with hydrocodone. Will try low dose of butalbital. We discussed this medication may make him drowsy. If no improvement, encouraged him to consider earlier followup with neurology.

## 2013-09-16 NOTE — Assessment & Plan Note (Signed)
Will obtain records from urgent care. Continue hydrocodone as needed.

## 2013-09-16 NOTE — Progress Notes (Signed)
Subjective:    Patient ID: Steve Phillips, male    DOB: Mar 28, 1946, 67 y.o.   MRN: [id number]  HPI 67 year old male with history of coronary artery disease, diabetes, hyperlipidemia, diabetic neuropathy presents for followup after recent fall and concussion. He has had multiple recurrent falls. He recently fell backwards hitting his head on a metal post. Since that time, he has had severe headache. He was evaluated with CT of the head and neck which were normal. He was seen by neurology and diagnosed as having a concussion. He has been instructed to rest. He reports headache pain has been severe. This is described as aching across his entire head. Pain is made worse with increased movement and activity. He has been taking hydrocodone with no improvement. He has also been taking Zanaflex with no improvement. He denies visual changes or focal neurologic symptoms. He denies chest pain or shortness of breath. In the interim since his last visit, he did have another fall in which he landed on his left side. He had severe pain in his left ribs and was seen at urgent care and diagnosed with a rib fracture. He also reports persistent pain at the site. He does not have any shortness of breath.  He reports blood sugars have been improved and he had a lower blood sugar this morning of 61. This is unusual for him. Most blood sugars are typically greater than 200.  Outpatient Encounter Prescriptions as of 09/16/2013  Medication Sig Dispense Refill  . aspirin 81 MG tablet Take 81 mg by mouth 3 (three) times daily.      . carvedilol (COREG) 25 MG tablet TAKE ONE TABLET BY MOUTH TWICE DAILY  60 tablet  5  . DULoxetine (CYMBALTA) 60 MG capsule Take 1 capsule (60 mg total) by mouth daily.  90 capsule  3  . glucose blood (FREESTYLE TEST STRIPS) test strip Use to check blood sugar up to three times daily  100 each  11  . HYDROcodone-acetaminophen (NORCO/VICODIN) 5-325 MG per tablet Take 1 tablet by mouth every 8 (eight)  hours as needed for pain.  90 tablet  0  . insulin aspart (NOVOLOG FLEXPEN) 100 UNIT/ML injection Inject 25 units before meals per sliding scale. +  9 vial  3  . isosorbide mononitrate (IMDUR) 30 MG 24 hr tablet Take 1 tablet (30 mg total) by mouth daily.  90 tablet  4  . LANTUS 100 UNIT/ML injection INJECT 80 UNITS            SUBCUTANEOUSLY TWO TIMES A DAY  150 mL  3  . lisinopril-hydrochlorothiazide (PRINZIDE,ZESTORETIC) 20-12.5 MG per tablet Take 1 tablet by mouth daily.      . metFORMIN (GLUCOPHAGE) 1000 MG tablet Take 1 tablet (1,000 mg total) by mouth 2 (two) times daily.  180 tablet  1  . nitroGLYCERIN (NITROSTAT) 0.4 MG SL tablet Place 1 tablet (0.4 mg total) under the tongue every 5 (five) minutes as needed for chest pain.  30 tablet  6  . pantoprazole (PROTONIX) 40 MG tablet Take 1 tablet (40 mg total) by mouth 2 (two) times daily.  180 tablet  3  . pregabalin (LYRICA) 75 MG capsule Take 1 capsule (75 mg total) by mouth 2 (two) times daily.  180 capsule  2  . rosuvastatin (CRESTOR) 10 MG tablet Take 10 mg by mouth daily.      . tamsulosin (FLOMAX) 0.4 MG CAPS Take 1 capsule (0.4 mg total) by mouth daily after breakfast.  90 capsule  4  . tiZANidine (ZANAFLEX) 4 MG tablet Take 1 tablet (4 mg total) by mouth every 8 (eight) hours as needed (pain).  90 tablet  1   No facility-administered encounter medications on file as of 09/16/2013.   BP 160/96  Pulse 62  Temp(Src) 97.8 F (36.6 C) (Oral)  Wt 250 lb (113.399 kg)  BMI 33.9 kg/m2  SpO2 97%  Review of Systems  Constitutional: Negative for fever, chills, activity change, appetite change, fatigue and unexpected weight change.  Eyes: Negative for visual disturbance.  Respiratory: Negative for cough and shortness of breath.   Cardiovascular: Negative for chest pain, palpitations and leg swelling.  Gastrointestinal: Negative for abdominal pain and abdominal distention.  Genitourinary: Negative for dysuria, urgency and difficulty  urinating.  Musculoskeletal: Positive for gait problem. Negative for arthralgias.  Skin: Negative for color change and rash.  Neurological: Positive for weakness, light-headedness and headaches.  Hematological: Negative for adenopathy.  Psychiatric/Behavioral: Negative for sleep disturbance and dysphoric mood. The patient is not nervous/anxious.        Objective:   Physical Exam  Constitutional: He is oriented to person, place, and time. He appears well-developed and well-nourished. No distress.  HENT:  Head: Normocephalic and atraumatic.  Right Ear: External ear normal.  Left Ear: External ear normal.  Nose: Nose normal.  Mouth/Throat: Oropharynx is clear and moist. No oropharyngeal exudate.  Eyes: Conjunctivae and EOM are normal. Pupils are equal, round, and reactive to light. Right eye exhibits no discharge. Left eye exhibits no discharge. No scleral icterus.  Neck: Normal range of motion. Neck supple. No tracheal deviation present. No thyromegaly present.  Cardiovascular: Normal rate, regular rhythm and normal heart sounds.  Exam reveals no gallop and no friction rub.   No murmur heard. Pulmonary/Chest: Effort normal and breath sounds normal. No respiratory distress. He has no wheezes. He has no rales. He exhibits no tenderness.  Musculoskeletal: Normal range of motion. He exhibits no edema.  Lymphadenopathy:    He has no cervical adenopathy.  Neurological: He is alert and oriented to person, place, and time. No cranial nerve deficit. Coordination abnormal.  Skin: Skin is warm and dry. No rash noted. He is not diaphoretic. No erythema. No pallor.  Psychiatric: He has a normal mood and affect. His behavior is normal. Judgment and thought content normal.          Assessment & Plan:

## 2013-09-21 DIAGNOSIS — N401 Enlarged prostate with lower urinary tract symptoms: Secondary | ICD-10-CM | POA: Insufficient documentation

## 2013-09-21 DIAGNOSIS — R972 Elevated prostate specific antigen [PSA]: Secondary | ICD-10-CM | POA: Diagnosis not present

## 2013-09-21 DIAGNOSIS — R339 Retention of urine, unspecified: Secondary | ICD-10-CM | POA: Insufficient documentation

## 2013-09-21 DIAGNOSIS — N138 Other obstructive and reflux uropathy: Secondary | ICD-10-CM | POA: Insufficient documentation

## 2013-10-01 ENCOUNTER — Encounter: Payer: Self-pay | Admitting: *Deleted

## 2013-10-02 ENCOUNTER — Ambulatory Visit (INDEPENDENT_AMBULATORY_CARE_PROVIDER_SITE_OTHER): Payer: Medicare Other | Admitting: Internal Medicine

## 2013-10-02 ENCOUNTER — Encounter: Payer: Self-pay | Admitting: Internal Medicine

## 2013-10-02 VITALS — BP 138/90 | HR 70 | Temp 97.9°F | Resp 12 | Ht 72.0 in | Wt 251.5 lb

## 2013-10-02 DIAGNOSIS — R51 Headache: Secondary | ICD-10-CM | POA: Diagnosis not present

## 2013-10-02 DIAGNOSIS — E1149 Type 2 diabetes mellitus with other diabetic neurological complication: Secondary | ICD-10-CM

## 2013-10-02 DIAGNOSIS — E1142 Type 2 diabetes mellitus with diabetic polyneuropathy: Secondary | ICD-10-CM

## 2013-10-02 DIAGNOSIS — E538 Deficiency of other specified B group vitamins: Secondary | ICD-10-CM

## 2013-10-02 MED ORDER — CYANOCOBALAMIN 1000 MCG/ML IJ SOLN
1000.0000 ug | Freq: Once | INTRAMUSCULAR | Status: AC
  Administered 2013-10-02: 1000 ug via INTRAMUSCULAR

## 2013-10-02 MED ORDER — PROMETHAZINE HCL 25 MG/ML IJ SOLN
25.0000 mg | Freq: Once | INTRAMUSCULAR | Status: AC
  Administered 2013-10-02: 25 mg via INTRAMUSCULAR

## 2013-10-02 MED ORDER — KETOROLAC TROMETHAMINE 30 MG/ML IJ SOLN
30.0000 mg | Freq: Once | INTRAMUSCULAR | Status: AC
  Administered 2013-10-02: 30 mg via INTRAMUSCULAR

## 2013-10-02 MED ORDER — ELETRIPTAN HYDROBROMIDE 40 MG PO TABS: 40.0000 mg | ORAL_TABLET | ORAL | Status: DC | PRN

## 2013-10-02 MED ORDER — PANTOPRAZOLE SODIUM 40 MG PO TBEC: 40.0000 mg | DELAYED_RELEASE_TABLET | Freq: Two times a day (BID) | ORAL | Status: DC

## 2013-10-02 NOTE — Assessment & Plan Note (Signed)
Severe persistent headache after concussion. CT was normal. No improvement with use of hydrocodone, butalbital. Will try adding Relpax to see if any improvement. Today, will give injection of Toradol and Phenergan to help with symptoms. If no improvement, would favor earlier neurology followup.

## 2013-10-02 NOTE — Assessment & Plan Note (Signed)
Very poorly controlled blood sugars. Encouraged compliance with medications and diet. Endocrinology referral pending.

## 2013-10-02 NOTE — Progress Notes (Signed)
Subjective:    Patient ID: Steve Phillips, male    DOB: Jan 02, 1946, 66 y.o.   MRN: [id number]  HPI 67 year old male with history of poorly controlled diabetes, coronary artery disease, hypertension, hyperlipidemia, and recent fall with head injury presents for followup. He reports significant, persistent headache pain ever since his fall. He had been evaluated with a CT of the head and neurology consultation with diagnosis of concussion. He has tried to limit his activity. However, he has daily severe headache described as aching or throbbing pain in the right side of his head. It sometimes radiates around his head. The pain is associated with nausea. He has not had any vomiting. Headache is made worse by increased activity. It is slightly improved with use of hydrocodone or butalbital however at times it seems refractory to these agents. He has followup scheduled with neurology in December.  In regards to diabetes, he reports blood sugars continue to be quite labile. He has blood sugars at times greater than 250 and at least 2 times this month has had blood sugars less than 70. He has not been consistently compliant with medications or diet.  Outpatient Encounter Prescriptions as of 10/02/2013  Medication Sig  . aspirin 81 MG tablet Take 81 mg by mouth 3 (three) times daily.  . butalbital-acetaminophen-caffeine (FIORICET) 50-325-40 MG per tablet Take 1-2 tablets by mouth every 6 (six) hours as needed for headache.  . carvedilol (COREG) 25 MG tablet TAKE ONE TABLET BY MOUTH TWICE DAILY  . DULoxetine (CYMBALTA) 60 MG capsule Take 1 capsule (60 mg total) by mouth daily.  Marland Kitchen glucose blood (FREESTYLE TEST STRIPS) test strip Use to check blood sugar up to three times daily  . HYDROcodone-acetaminophen (NORCO/VICODIN) 5-325 MG per tablet Take 1 tablet by mouth every 8 (eight) hours as needed for pain.  Marland Kitchen insulin aspart (NOVOLOG FLEXPEN) 100 UNIT/ML injection Inject 25 units before meals per sliding scale. +   . isosorbide mononitrate (IMDUR) 30 MG 24 hr tablet Take 1 tablet (30 mg total) by mouth daily.  Marland Kitchen LANTUS 100 UNIT/ML injection INJECT 80 UNITS            SUBCUTANEOUSLY TWO TIMES A DAY  . lisinopril-hydrochlorothiazide (PRINZIDE,ZESTORETIC) 20-12.5 MG per tablet Take 1 tablet by mouth daily.  . metFORMIN (GLUCOPHAGE) 1000 MG tablet Take 1 tablet (1,000 mg total) by mouth 2 (two) times daily.  . nitroGLYCERIN (NITROSTAT) 0.4 MG SL tablet Place 1 tablet (0.4 mg total) under the tongue every 5 (five) minutes as needed for chest pain.  . pantoprazole (PROTONIX) 40 MG tablet Take 1 tablet (40 mg total) by mouth 2 (two) times daily.  . pregabalin (LYRICA) 75 MG capsule Take 1 capsule (75 mg total) by mouth 2 (two) times daily.  . rosuvastatin (CRESTOR) 10 MG tablet Take 10 mg by mouth daily.  . tamsulosin (FLOMAX) 0.4 MG CAPS Take 1 capsule (0.4 mg total) by mouth daily after breakfast.  . tiZANidine (ZANAFLEX) 4 MG tablet Take 1 tablet (4 mg total) by mouth every 8 (eight) hours as needed (pain).   BP 138/90  Pulse 70  Temp(Src) 97.9 F (36.6 C) (Oral)  Resp 12  Ht 6' (1.829 m)  Wt 251 lb 8 oz (114.08 kg)  BMI 34.10 kg/m2  SpO2 95%  Review of Systems  Constitutional: Negative for fever, chills, activity change, appetite change, fatigue and unexpected weight change.  Eyes: Negative for visual disturbance.  Respiratory: Negative for cough and shortness of breath.   Cardiovascular:  Negative for chest pain, palpitations and leg swelling.  Gastrointestinal: Negative for abdominal pain and abdominal distention.  Genitourinary: Negative for dysuria, urgency and difficulty urinating.  Musculoskeletal: Positive for gait problem. Negative for arthralgias.  Skin: Negative for color change and rash.  Neurological: Positive for weakness, numbness and headaches.  Hematological: Negative for adenopathy.  Psychiatric/Behavioral: Negative for sleep disturbance and dysphoric mood. The patient is not  nervous/anxious.        Objective:   Physical Exam  Constitutional: He is oriented to person, place, and time. He appears well-developed and well-nourished. No distress.  HENT:  Head: Normocephalic and atraumatic.  Right Ear: External ear normal.  Left Ear: External ear normal.  Nose: Nose normal.  Mouth/Throat: Oropharynx is clear and moist. No oropharyngeal exudate.  Eyes: Conjunctivae and EOM are normal. Pupils are equal, round, and reactive to light. Right eye exhibits no discharge. Left eye exhibits no discharge. No scleral icterus.  Neck: Normal range of motion. Neck supple. No tracheal deviation present. No thyromegaly present.  Cardiovascular: Normal rate, regular rhythm and normal heart sounds.  Exam reveals no gallop and no friction rub.   No murmur heard. Pulmonary/Chest: Effort normal and breath sounds normal. No respiratory distress. He has no wheezes. He has no rales. He exhibits no tenderness.  Musculoskeletal: Normal range of motion. He exhibits no edema.  Lymphadenopathy:    He has no cervical adenopathy.  Neurological: He is alert and oriented to person, place, and time. No cranial nerve deficit. Coordination normal.  Skin: Skin is warm and dry. No rash noted. He is not diaphoretic. No erythema. No pallor.  Psychiatric: He has a normal mood and affect. His behavior is normal. Judgment and thought content normal.          Assessment & Plan:

## 2013-10-02 NOTE — Patient Instructions (Signed)

## 2013-10-02 NOTE — Progress Notes (Signed)
Pre visit review using our clinic review tool, if applicable. No additional management support is needed unless otherwise documented below in the visit note. 

## 2013-10-08 DIAGNOSIS — R51 Headache: Secondary | ICD-10-CM | POA: Diagnosis not present

## 2013-10-08 DIAGNOSIS — R209 Unspecified disturbances of skin sensation: Secondary | ICD-10-CM | POA: Diagnosis not present

## 2013-10-27 ENCOUNTER — Ambulatory Visit: Payer: Self-pay | Admitting: Neurology

## 2013-10-27 DIAGNOSIS — M4802 Spinal stenosis, cervical region: Secondary | ICD-10-CM | POA: Diagnosis not present

## 2013-10-27 DIAGNOSIS — R51 Headache: Secondary | ICD-10-CM | POA: Diagnosis not present

## 2013-10-29 ENCOUNTER — Encounter: Payer: Self-pay | Admitting: *Deleted

## 2013-10-30 ENCOUNTER — Ambulatory Visit (INDEPENDENT_AMBULATORY_CARE_PROVIDER_SITE_OTHER): Payer: Medicare Other | Admitting: Internal Medicine

## 2013-10-30 VITALS — BP 134/80 | HR 78 | Temp 98.5°F | Wt 250.0 lb

## 2013-10-30 DIAGNOSIS — R269 Unspecified abnormalities of gait and mobility: Secondary | ICD-10-CM | POA: Diagnosis not present

## 2013-10-30 DIAGNOSIS — Z9119 Patient's noncompliance with other medical treatment and regimen: Secondary | ICD-10-CM

## 2013-10-30 DIAGNOSIS — G20 Parkinson's disease: Secondary | ICD-10-CM | POA: Diagnosis not present

## 2013-10-30 DIAGNOSIS — R9409 Abnormal results of other function studies of central nervous system: Secondary | ICD-10-CM | POA: Diagnosis not present

## 2013-10-30 DIAGNOSIS — E1149 Type 2 diabetes mellitus with other diabetic neurological complication: Secondary | ICD-10-CM

## 2013-10-30 DIAGNOSIS — E1142 Type 2 diabetes mellitus with diabetic polyneuropathy: Secondary | ICD-10-CM | POA: Diagnosis not present

## 2013-10-30 DIAGNOSIS — E538 Deficiency of other specified B group vitamins: Secondary | ICD-10-CM

## 2013-10-30 MED ORDER — CLOPIDOGREL BISULFATE 75 MG PO TABS: 75.0000 mg | ORAL_TABLET | Freq: Every day | ORAL | Status: DC

## 2013-10-30 MED ORDER — CYANOCOBALAMIN 1000 MCG/ML IJ SOLN
1000.0000 ug | Freq: Once | INTRAMUSCULAR | Status: AC
  Administered 2013-10-30: 1000 ug via INTRAMUSCULAR

## 2013-10-30 NOTE — Progress Notes (Signed)
Pre visit review using our clinic review tool, if applicable. No additional management support is needed unless otherwise documented below in the visit note. 

## 2013-10-31 ENCOUNTER — Encounter: Payer: Self-pay | Admitting: Internal Medicine

## 2013-10-31 DIAGNOSIS — Z9119 Patient's noncompliance with other medical treatment and regimen: Secondary | ICD-10-CM | POA: Insufficient documentation

## 2013-10-31 NOTE — Assessment & Plan Note (Signed)
Reviewed notes from Dr. Sherryll Burger at [place] today. Await evaluation from movement disorder specialist at [facility]. Continue Sinemet.

## 2013-10-31 NOTE — Assessment & Plan Note (Signed)
Very poor control of blood sugars over the last year. Non-compliant with medication and diet. Steve Phillips discussion today about his known complications from diabetes including CAD, PVD, neuropathy. He canceled appointment scheduled with endocrinologist. Strongly encouraged him to consider rescheduling. For now, no changes to medication regimen, as pt non-compliant with existing regimen.

## 2013-10-31 NOTE — Assessment & Plan Note (Signed)
As noted, encouraged better compliance with diet and medication. Encouraged him to consider endocrine evaluation.

## 2013-10-31 NOTE — Progress Notes (Signed)
Subjective:    Patient ID: Steve Phillips, male    DOB: 1945/12/22, 67 y.o.   MRN: [id number]  HPI 67 year old male with history of diabetes, hypertension, coronary artery disease, and gait instability presents for followup. In regards to his diabetes, recent A1c was markedly elevated at 11.5%. We reviewed his previous A1c readings today and over the last year all of his readings have been greater than 10%. He is not compliant with his medication regimen. He is not compliant with diet. We have made referral to endocrinology and they canceled this appointment. He has complications from diabetes including peripheral vascular disease, neuropathy, and chronic wounds.  He reports he was recently seen by his neurologist for headaches and a neurologist noticed that he had a tremor in his hands. He went through some testing and was diagnosed with Parkinson's. He is scheduled to see a movement disorder specialist at [facility]. He has been started on carbidopa levodopa.  Outpatient Encounter Prescriptions as of 10/30/2013  Medication Sig  . aspirin 81 MG tablet Take 81 mg by mouth 3 (three) times daily.  . butalbital-acetaminophen-caffeine (FIORICET) 50-325-40 MG per tablet Take 1-2 tablets by mouth every 6 (six) hours as needed for headache.  . carbidopa-levodopa (SINEMET IR) 25-100 MG per tablet Take 1.5 tablets by mouth 3 (three) times daily.  . carvedilol (COREG) 25 MG tablet TAKE ONE TABLET BY MOUTH TWICE DAILY  . DULoxetine (CYMBALTA) 60 MG capsule Take 1 capsule (60 mg total) by mouth daily.  Marland Kitchen eletriptan (RELPAX) 40 MG tablet Take 1 tablet (40 mg total) by mouth as needed for migraine or headache. One tablet by mouth at onset of headache. May repeat in 2 hours if headache persists or recurs.  Marland Kitchen glucose blood (FREESTYLE TEST STRIPS) test strip Use to check blood sugar up to three times daily  . HYDROcodone-acetaminophen (NORCO/VICODIN) 5-325 MG per tablet Take 1 tablet by mouth every 8 (eight) hours  as needed for pain.  Marland Kitchen insulin aspart (NOVOLOG FLEXPEN) 100 UNIT/ML injection Inject 25 units before meals per sliding scale. +  . isosorbide mononitrate (IMDUR) 30 MG 24 hr tablet Take 1 tablet (30 mg total) by mouth daily.  Marland Kitchen LANTUS 100 UNIT/ML injection INJECT 80 UNITS            SUBCUTANEOUSLY TWO TIMES A DAY  . lisinopril-hydrochlorothiazide (PRINZIDE,ZESTORETIC) 20-12.5 MG per tablet Take 1 tablet by mouth daily.  . metFORMIN (GLUCOPHAGE) 1000 MG tablet Take 1 tablet (1,000 mg total) by mouth 2 (two) times daily.  . nitroGLYCERIN (NITROSTAT) 0.4 MG SL tablet Place 1 tablet (0.4 mg total) under the tongue every 5 (five) minutes as needed for chest pain.  . pantoprazole (PROTONIX) 40 MG tablet Take 1 tablet (40 mg total) by mouth 2 (two) times daily.  . pregabalin (LYRICA) 75 MG capsule Take 1 capsule (75 mg total) by mouth 2 (two) times daily.  . rosuvastatin (CRESTOR) 10 MG tablet Take 10 mg by mouth daily.  . tamsulosin (FLOMAX) 0.4 MG CAPS Take 1 capsule (0.4 mg total) by mouth daily after breakfast.  . tiZANidine (ZANAFLEX) 4 MG tablet Take 1 tablet (4 mg total) by mouth every 8 (eight) hours as needed (pain).  Marland Kitchen clopidogrel (PLAVIX) 75 MG tablet Take 1 tablet (75 mg total) by mouth daily with breakfast.  . [DISCONTINUED] clopidogrel (PLAVIX) 75 MG tablet Take 75 mg by mouth.  . [EXPIRED] cyanocobalamin ((VITAMIN B-12)) injection 1,000 mcg    BP 134/80  Pulse 78  Temp(Src)  98.5 F (36.9 C) (Oral)  Wt 250 lb (113.399 kg)  SpO2 96%  Review of Systems  Constitutional: Positive for fatigue. Negative for fever, chills, activity change, appetite change and unexpected weight change.  Eyes: Negative for visual disturbance.  Respiratory: Negative for cough and shortness of breath.   Cardiovascular: Negative for chest pain, palpitations and leg swelling.  Gastrointestinal: Negative for abdominal pain and abdominal distention.  Genitourinary: Negative for dysuria, urgency and difficulty  urinating.  Musculoskeletal: Positive for arthralgias, gait problem and myalgias.  Skin: Negative for color change and rash.  Neurological: Positive for tremors, weakness and numbness.  Hematological: Negative for adenopathy.  Psychiatric/Behavioral: Negative for sleep disturbance and dysphoric mood. The patient is not nervous/anxious.        Objective:   Physical Exam  Constitutional: He is oriented to person, place, and time. He appears well-developed and well-nourished. No distress.  HENT:  Head: Normocephalic and atraumatic.  Right Ear: External ear normal.  Left Ear: External ear normal.  Nose: Nose normal.  Mouth/Throat: Oropharynx is clear and moist. No oropharyngeal exudate.  Eyes: Conjunctivae and EOM are normal. Pupils are equal, round, and reactive to light. Right eye exhibits no discharge. Left eye exhibits no discharge. No scleral icterus.  Neck: Normal range of motion. Neck supple. No tracheal deviation present. No thyromegaly present.  Cardiovascular: Normal rate, regular rhythm and normal heart sounds.  Exam reveals no gallop and no friction rub.   No murmur heard. Pulmonary/Chest: Effort normal and breath sounds normal. No respiratory distress. He has no wheezes. He has no rales. He exhibits no tenderness.  Musculoskeletal: Normal range of motion. He exhibits no edema.  Lymphadenopathy:    He has no cervical adenopathy.  Neurological: He is alert and oriented to person, place, and time. He displays tremor (fine bilateral hands). No cranial nerve deficit. He exhibits normal muscle tone. Coordination and gait abnormal.  Skin: Skin is warm and dry. No rash noted. He is not diaphoretic. No erythema. No pallor.  Psychiatric: He has a normal mood and affect. His behavior is normal. Judgment and thought content normal.          Assessment & Plan:

## 2013-11-06 ENCOUNTER — Encounter: Payer: Self-pay | Admitting: Neurology

## 2013-11-06 ENCOUNTER — Ambulatory Visit: Payer: Medicare Other | Admitting: Neurology

## 2013-11-06 ENCOUNTER — Other Ambulatory Visit: Payer: Self-pay | Admitting: Neurology

## 2013-11-06 ENCOUNTER — Ambulatory Visit (INDEPENDENT_AMBULATORY_CARE_PROVIDER_SITE_OTHER): Payer: Medicare Other | Admitting: Neurology

## 2013-11-06 VITALS — BP 190/90 | HR 68 | Temp 97.6°F | Resp 18 | Ht 73.0 in | Wt 250.8 lb

## 2013-11-06 DIAGNOSIS — G20 Parkinson's disease: Secondary | ICD-10-CM | POA: Diagnosis not present

## 2013-11-06 DIAGNOSIS — E1149 Type 2 diabetes mellitus with other diabetic neurological complication: Secondary | ICD-10-CM

## 2013-11-06 DIAGNOSIS — G214 Vascular parkinsonism: Secondary | ICD-10-CM

## 2013-11-06 DIAGNOSIS — H02409 Unspecified ptosis of unspecified eyelid: Secondary | ICD-10-CM | POA: Diagnosis not present

## 2013-11-06 DIAGNOSIS — E1142 Type 2 diabetes mellitus with diabetic polyneuropathy: Secondary | ICD-10-CM

## 2013-11-06 DIAGNOSIS — W19XXXA Unspecified fall, initial encounter: Secondary | ICD-10-CM | POA: Diagnosis not present

## 2013-11-06 NOTE — Progress Notes (Signed)
Steve Phillips was seen today in the movement disorders clinic for neurologic consultation at the request of Dr. Sherryll Burger.  His PCP is Ronna Polio, MD.  The consultation is for the evaluation of possible PD.  He previously saw Dr. Modesto Charon in 2012 but that was for headache.    The pt notes that he has had falls off and on for a year.  He did have c-spine surgery in June, which seemed to help some.  2 months ago, the pt fell out of a hammock and hit his head on a chain link fence.  He couldn't get up.  He said that he had headache that day prior to the fall but has continued with headache ever since.  That is why he was originally sent to Dr. Sherryll Burger.  He had an MRI brain but it was at [facility].   The pt was started on carbidopa/levodopa 25/100 on Monday and only taking 1/2 tablets three times per day.  He just started it, so he is unsure if it has been helpful.  He is scheduled to start rehabilitation on Monday.  Specific Symptoms:  Tremor: yes (some in both hands, only with using hands, noted with eating/drinking; wife states that she notes sometimes at rest) Voice: no real change, had some hoarseness after the c-spine surgery but that got better Sleep: trouble getting to sleep  Vivid Dreams:  yes  Acting out dreams:  yes Wet Pillows: yes Postural symptoms:  yes  Falls?  yes (just fell yesterday; notes problem going down stairs; often falls backwards; fell outside after mri recently but had valium for the MRI and was raining and was outside for an hour until wife came home) Bradykinesia symptoms: shuffling gait, difficulty getting out of a chair and difficulty regaining balance Loss of smell:  no Loss of taste:  no Urinary Incontinence:  yes Difficulty Swallowing:  yes but only after c-spine surgery via anterior approach Handwriting, micrographia: yes Trouble with ADL's:  yes, trouble with putting shoes on because gets dizzy when bends over.  "its a chore to get dressed"  Trouble buttoning clothing:  yes Depression:  no but irritable per wife Memory changes:  Yes, some trouble getting from one place to another (rarely drives now) Hallucinations:  no  visual distortions: no N/V:  no Lightheaded:  yes, intermittent  Syncope: no Diplopia:  no Dyskinesia:  no  Neuroimaging has previously been performed.  It is not available for my review today.  PREVIOUS MEDICATIONS: Sinemet  ALLERGIES:   Allergies  Allergen Reactions  . Codeine Nausea Only    Derivatives Nausea.  . Tramadol     unknown  . Trazodone And Nefazodone Nausea Only    CURRENT MEDICATIONS:  Current Outpatient Prescriptions on File Prior to Visit  Medication Sig Dispense Refill  . aspirin 81 MG tablet Take 81 mg by mouth 3 (three) times daily.      . carbidopa-levodopa (SINEMET IR) 25-100 MG per tablet Take 1.5 tablets by mouth 3 (three) times daily.      . carvedilol (COREG) 25 MG tablet TAKE ONE TABLET BY MOUTH TWICE DAILY  60 tablet  5  . clopidogrel (PLAVIX) 75 MG tablet Take 1 tablet (75 mg total) by mouth daily with breakfast.  90 tablet  3  . DULoxetine (CYMBALTA) 60 MG capsule Take 1 capsule (60 mg total) by mouth daily.  90 capsule  3  . glucose blood (FREESTYLE TEST STRIPS) test strip Use to check blood sugar up  to three times daily  100 each  11  . HYDROcodone-acetaminophen (NORCO/VICODIN) 5-325 MG per tablet Take 1 tablet by mouth every 8 (eight) hours as needed for pain.  90 tablet  0  . insulin aspart (NOVOLOG FLEXPEN) 100 UNIT/ML injection Inject 25 units before meals per sliding scale. +  9 vial  3  . isosorbide mononitrate (IMDUR) 30 MG 24 hr tablet Take 1 tablet (30 mg total) by mouth daily.  90 tablet  4  . LANTUS 100 UNIT/ML injection INJECT 80 UNITS            SUBCUTANEOUSLY TWO TIMES A DAY  150 mL  3  . lisinopril-hydrochlorothiazide (PRINZIDE,ZESTORETIC) 20-12.5 MG per tablet Take 1 tablet by mouth daily.      . metFORMIN (GLUCOPHAGE) 1000 MG tablet Take 1 tablet (1,000 mg total) by mouth 2  (two) times daily.  180 tablet  1  . nitroGLYCERIN (NITROSTAT) 0.4 MG SL tablet Place 1 tablet (0.4 mg total) under the tongue every 5 (five) minutes as needed for chest pain.  30 tablet  6  . pantoprazole (PROTONIX) 40 MG tablet Take 1 tablet (40 mg total) by mouth 2 (two) times daily.  180 tablet  3  . pregabalin (LYRICA) 75 MG capsule Take 1 capsule (75 mg total) by mouth 2 (two) times daily.  180 capsule  2  . rosuvastatin (CRESTOR) 10 MG tablet Take 10 mg by mouth daily.      . tamsulosin (FLOMAX) 0.4 MG CAPS Take 1 capsule (0.4 mg total) by mouth daily after breakfast.  90 capsule  4  . tiZANidine (ZANAFLEX) 4 MG tablet Take 1 tablet (4 mg total) by mouth every 8 (eight) hours as needed (pain).  90 tablet  1   No current facility-administered medications on file prior to visit.    PAST MEDICAL HISTORY:   Past Medical History  Diagnosis Date  . Shingles   . Diabetes mellitus   . Hyperlipidemia   . Broken leg     left...s/p pins  . Lower leg pain     chronic ,left leg  . Acute angina   . Depression   . CAD (coronary artery disease)     s/p multiple PCI with stent RCA,LAD and obtuse marginal,followed @ [facility] on the accord study.., cath 02/2012 90% D1, s/p drug-eluting stent Dr. Kirke Corin  . Falls 09/2011  . Dizziness   . Shortness of breath   . Headache(784.0)   . Hypertension     dr Judie Petit Kennith Maes     [facility] in [place]    PAST SURGICAL HISTORY:   Past Surgical History  Procedure Laterality Date  . Appendectomy    . Cholecystectomy    . Left leg surgery    . Left foot surgery    . Drainage port in left testicle    . Cardiac catheterization  02/2012  . Back surgery    . Anterior cervical decomp/discectomy fusion N/A 04/30/2013    Procedure: ANTERIOR CERVICAL DECOMPRESSION/DISCECTOMY FUSION 2 LEVELS;  Surgeon: Reinaldo Meeker, MD;  Location: [facility];  Service: Neurosurgery;  Laterality: N/A;  Cervical four-five, Cervical six-seven anterior cervical decompression fusion with  trabecular metal cage and plate    SOCIAL HISTORY:   History   Social History  . Marital Status: Married    Spouse Name: N/A    Number of Children: N/A  . Years of Education: N/A   Occupational History  . Not on file.   Social History Main  Topics  . Smoking status: Former Smoker    Quit date: 11/08/1971  . Smokeless tobacco: Never Used  . Alcohol Use: No  . Drug Use: No  . Sexual Activity: Not on file   Other Topics Concern  . Not on file   Social History Narrative  . No narrative on file    FAMILY HISTORY:   Family Status  Relation Status Death Age  . Mother Deceased   . Father Deceased   . Sister Alive   . Brother Alive   . Sister Alive     ROS:  L leg been weaker for years (10 years) after fx of leg.  R arm feels decreased grasp.  A complete 10 system review of systems was obtained and was unremarkable apart from what is mentioned above.  PHYSICAL EXAMINATION:    VITALS:   Filed Vitals:   11/06/13 1127  BP: 190/90  Pulse: 68  Temp: 97.6 F (36.4 C)  Resp: 18  Height: 6\' 1"  (1.854 m)  Weight: 250 lb 12.8 oz (113.762 kg)    GEN:  The patient appears stated age and is in NAD. HEENT:  Normocephalic, atraumatic.  The mucous membranes are moist. The superficial temporal arteries are without ropiness or tenderness. CV:  RRR Lungs:  CTAB Neck/HEME:  There are no carotid bruits bilaterally.  Neurological examination:  Orientation: The patient is alert and oriented x3. Fund of knowledge is appropriate.  Recent and remote memory are intact.  Attention and concentration are normal.    Able to name objects and repeat phrases. Cranial nerves: There is good facial symmetry.  There is mild ptosis on the right.  Pupils are equal round and reactive to light bilaterally. Fundoscopic exam reveals clear margins bilaterally. Extraocular muscles are intact. The visual fields are full to confrontational testing. The speech is fluent and clear. Soft palate rises  symmetrically and there is no tongue deviation. Hearing is intact to conversational tone. Sensation: Sensation is intact to light and pinprick throughout (facial, trunk, extremities). Vibration is virtually absent in the lower extremities. There is no extinction with double simultaneous stimulation. There is no sensory dermatomal level identified. Motor: Strength is 5/5 in the bilateral upper and lower extremities.   Shoulder shrug is equal and symmetric.  There is no pronator drift.  There are no fasciculations. Deep tendon reflexes: Deep tendon reflexes are 1/4 at the bilateral biceps, triceps, brachioradialis, absent at the bilateral patella and achilles. Plantar responses are downgoing bilaterally.  Movement examination: Tone: There is normal tone in the bilateral upper extremities.  The tone in the lower extremities is normal.  Abnormal movements: A mild tremor can be felt in the right upper extremity, but is not seen. Coordination:  There is no decremation with RAM's is noted with any form of rapid alternating movements, including hand opening and closing, finger taps, heel taps or toe taps. Gait and Station: The patient has minimal difficulty arising out of a deep-seated chair without the use of the hands. The patient's stride length is decreased.   ASSESSMENT/PLAN:  1.  I do not think that the pt has PD or any other related syndrome such as MSA or PSP.  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  Vascular parkinsonism is generally manifested most when the patient stands.  About 30% of patients with vascular parkinsonism will respond to levodopa, and I agree with the initiation of this by Dr. Sherryll Burger.  I would be curious to see his MRI of  the brain to see if this confirms the idea of vascular parkinsonism.  -I. did draw acetylcholine receptor antibodies today.  I think this is low in the differential diagnoses, but he had mild ptosis and complains of shortness of breath and falls.  -I.  think that peripheral neuropathy secondary to diabetes plays a significant role in his gait instability.  -I. agree that the patient could benefit from physical therapy.  He is to start on Monday.  -We talked about the idea of a DaT scan but don think that would be very useful right now.  If he does get worse, we could consider this and perhaps EMG testing could be of value.    -talked about the importance of a healthy lifestyle.  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  We talked about the importance of diet and exercise. 2.  HTN - He did not take BP medication today. 3.  he will continue to follow with Dr Sherryll Burger, whom has provided great neurologic care.  The pt will f/u here prn.

## 2013-11-06 NOTE — Patient Instructions (Signed)
1.  Continue with your levodopa.  A higher dose may be needed 2.  I recommend physical therapy 3.  Call me in 2 weeks and I will give you the results of your labs 4.  IF not better or if something new arises, then you may need an EMG.  Call me.

## 2013-11-16 ENCOUNTER — Encounter: Payer: Self-pay | Admitting: Neurology

## 2013-11-16 DIAGNOSIS — M6281 Muscle weakness (generalized): Secondary | ICD-10-CM | POA: Diagnosis not present

## 2013-11-16 DIAGNOSIS — R269 Unspecified abnormalities of gait and mobility: Secondary | ICD-10-CM | POA: Diagnosis not present

## 2013-11-16 DIAGNOSIS — IMO0001 Reserved for inherently not codable concepts without codable children: Secondary | ICD-10-CM | POA: Diagnosis not present

## 2013-11-18 DIAGNOSIS — M6281 Muscle weakness (generalized): Secondary | ICD-10-CM | POA: Diagnosis not present

## 2013-11-18 DIAGNOSIS — R269 Unspecified abnormalities of gait and mobility: Secondary | ICD-10-CM | POA: Diagnosis not present

## 2013-11-18 DIAGNOSIS — IMO0001 Reserved for inherently not codable concepts without codable children: Secondary | ICD-10-CM | POA: Diagnosis not present

## 2013-11-19 ENCOUNTER — Encounter: Payer: Self-pay | Admitting: Neurology

## 2013-11-19 DIAGNOSIS — I639 Cerebral infarction, unspecified: Secondary | ICD-10-CM

## 2013-11-19 DIAGNOSIS — IMO0001 Reserved for inherently not codable concepts without codable children: Secondary | ICD-10-CM | POA: Diagnosis not present

## 2013-11-19 DIAGNOSIS — M6281 Muscle weakness (generalized): Secondary | ICD-10-CM | POA: Diagnosis not present

## 2013-11-19 DIAGNOSIS — R269 Unspecified abnormalities of gait and mobility: Secondary | ICD-10-CM | POA: Diagnosis not present

## 2013-11-19 HISTORY — DX: Cerebral infarction, unspecified: I63.9

## 2013-11-24 ENCOUNTER — Other Ambulatory Visit (INDEPENDENT_AMBULATORY_CARE_PROVIDER_SITE_OTHER): Payer: Medicare Other

## 2013-11-24 DIAGNOSIS — E1149 Type 2 diabetes mellitus with other diabetic neurological complication: Secondary | ICD-10-CM

## 2013-11-24 DIAGNOSIS — E1142 Type 2 diabetes mellitus with diabetic polyneuropathy: Secondary | ICD-10-CM

## 2013-11-24 LAB — COMPREHENSIVE METABOLIC PANEL
ALT: 15 U/L (ref 0–53)
AST: 20 U/L (ref 0–37)
Albumin: 3.4 g/dL — ABNORMAL LOW (ref 3.5–5.2)
Alkaline Phosphatase: 61 U/L (ref 39–117)
BILIRUBIN TOTAL: 0.6 mg/dL (ref 0.3–1.2)
BUN: 10 mg/dL (ref 6–23)
CO2: 29 mEq/L (ref 19–32)
CREATININE: 1 mg/dL (ref 0.4–1.5)
Calcium: 8.4 mg/dL (ref 8.4–10.5)
Chloride: 102 mEq/L (ref 96–112)
GFR: 77.76 mL/min (ref >60.00)
Glucose, Bld: 155 mg/dL — ABNORMAL HIGH (ref 70–99)
Potassium: 3.7 mEq/L (ref 3.5–5.1)
Sodium: 139 mEq/L (ref 135–145)
Total Protein: 6.2 g/dL (ref 6.0–8.3)

## 2013-11-24 LAB — HEMOGLOBIN A1C: Hgb A1c MFr Bld: 10.4 % — ABNORMAL HIGH (ref 4.6–6.5)

## 2013-11-25 ENCOUNTER — Other Ambulatory Visit: Payer: Medicare Other

## 2013-11-26 ENCOUNTER — Encounter: Payer: Self-pay | Admitting: Emergency Medicine

## 2013-11-26 ENCOUNTER — Encounter: Payer: Self-pay | Admitting: Internal Medicine

## 2013-11-26 ENCOUNTER — Ambulatory Visit (INDEPENDENT_AMBULATORY_CARE_PROVIDER_SITE_OTHER): Payer: Medicare Other | Admitting: Internal Medicine

## 2013-11-26 VITALS — BP 160/82 | HR 78 | Temp 97.7°F | Wt 252.0 lb

## 2013-11-26 DIAGNOSIS — E1142 Type 2 diabetes mellitus with diabetic polyneuropathy: Secondary | ICD-10-CM

## 2013-11-26 DIAGNOSIS — E1149 Type 2 diabetes mellitus with other diabetic neurological complication: Secondary | ICD-10-CM | POA: Diagnosis not present

## 2013-11-26 DIAGNOSIS — I10 Essential (primary) hypertension: Secondary | ICD-10-CM

## 2013-11-26 DIAGNOSIS — J029 Acute pharyngitis, unspecified: Secondary | ICD-10-CM | POA: Diagnosis not present

## 2013-11-26 DIAGNOSIS — R51 Headache: Secondary | ICD-10-CM

## 2013-11-26 LAB — POCT INFLUENZA A/B
INFLUENZA A, POC: NEGATIVE
INFLUENZA B, POC: NEGATIVE

## 2013-11-26 MED ORDER — AMOXICILLIN-POT CLAVULANATE 875-125 MG PO TABS: 1.0000 | ORAL_TABLET | Freq: Two times a day (BID) | ORAL | Status: DC

## 2013-11-26 MED ORDER — TIZANIDINE HCL 4 MG PO TABS: 4.0000 mg | ORAL_TABLET | Freq: Three times a day (TID) | ORAL | Status: DC | PRN

## 2013-11-26 MED ORDER — TAMSULOSIN HCL 0.4 MG PO CAPS: 0.4000 mg | ORAL_CAPSULE | Freq: Every day | ORAL | Status: DC

## 2013-11-26 NOTE — Progress Notes (Signed)
Subjective:    Patient ID: Steve Phillips, male    DOB: September 07, 1946, 68 y.o.   MRN: [id number]  HPI  68YO male with DM, CAD, HTN presents for acute visit. Developed sore throat Tuesday night. Took Augmentin 875mg  three doses left over at home with some improvement. Slight chest congestion. Cough productive of clear sputum. No fever, chills.  No dyspnea. Grandson had strep throat.  He also reports that blood sugars continue to be elevated. Recent A1c was 10.2%. He is agreeable to referral to endocrinology. His wife reports that he has not been compliant with monitoring his blood sugar or taking his medication.  Outpatient Encounter Prescriptions as of 11/26/2013  Medication Sig  . aspirin 81 MG tablet Take 81 mg by mouth 3 (three) times daily.  . carbidopa-levodopa (SINEMET IR) 25-100 MG per tablet Take 1.5 tablets by mouth 3 (three) times daily.  . carvedilol (COREG) 25 MG tablet TAKE ONE TABLET BY MOUTH TWICE DAILY  . clopidogrel (PLAVIX) 75 MG tablet Take 1 tablet (75 mg total) by mouth daily with breakfast.  . DULoxetine (CYMBALTA) 60 MG capsule Take 1 capsule (60 mg total) by mouth daily.  Marland Kitchen glucose blood (FREESTYLE TEST STRIPS) test strip Use to check blood sugar up to three times daily  . HYDROcodone-acetaminophen (NORCO/VICODIN) 5-325 MG per tablet Take 1 tablet by mouth every 8 (eight) hours as needed for pain.  Marland Kitchen insulin aspart (NOVOLOG FLEXPEN) 100 UNIT/ML injection Inject 25 units before meals per sliding scale. +  . isosorbide mononitrate (IMDUR) 30 MG 24 hr tablet Take 1 tablet (30 mg total) by mouth daily.  Marland Kitchen LANTUS 100 UNIT/ML injection INJECT 80 UNITS            SUBCUTANEOUSLY TWO TIMES A DAY  . lisinopril-hydrochlorothiazide (PRINZIDE,ZESTORETIC) 20-12.5 MG per tablet Take 1 tablet by mouth daily.  . metFORMIN (GLUCOPHAGE) 1000 MG tablet Take 1 tablet (1,000 mg total) by mouth 2 (two) times daily.  . nitroGLYCERIN (NITROSTAT) 0.4 MG SL tablet Place 1 tablet (0.4 mg total) under  the tongue every 5 (five) minutes as needed for chest pain.  . pantoprazole (PROTONIX) 40 MG tablet Take 1 tablet (40 mg total) by mouth 2 (two) times daily.  . pregabalin (LYRICA) 75 MG capsule Take 1 capsule (75 mg total) by mouth 2 (two) times daily.  . rosuvastatin (CRESTOR) 10 MG tablet Take 10 mg by mouth daily.  . tamsulosin (FLOMAX) 0.4 MG CAPS capsule Take 1 capsule (0.4 mg total) by mouth daily after breakfast.  . tiZANidine (ZANAFLEX) 4 MG tablet Take 1 tablet (4 mg total) by mouth every 8 (eight) hours as needed (pain).  . [DISCONTINUED] tamsulosin (FLOMAX) 0.4 MG CAPS Take 1 capsule (0.4 mg total) by mouth daily after breakfast.  . [DISCONTINUED] tiZANidine (ZANAFLEX) 4 MG tablet Take 1 tablet (4 mg total) by mouth every 8 (eight) hours as needed (pain).     Review of Systems  Constitutional: Positive for fatigue. Negative for fever, chills and activity change.  HENT: Positive for congestion, postnasal drip, rhinorrhea and sore throat. Negative for ear discharge, ear pain, hearing loss, nosebleeds, sinus pressure, sneezing, tinnitus, trouble swallowing and voice change.   Eyes: Negative for discharge, redness, itching and visual disturbance.  Respiratory: Positive for cough. Negative for chest tightness, shortness of breath, wheezing and stridor.   Cardiovascular: Negative for chest pain and leg swelling.  Musculoskeletal: Negative for arthralgias, myalgias, neck pain and neck stiffness.  Skin: Negative for color change and rash.  Neurological: Negative for dizziness, facial asymmetry and headaches.  Psychiatric/Behavioral: Negative for sleep disturbance.       Objective:   Physical Exam  Constitutional: He is oriented to person, place, and time. He appears well-developed and well-nourished. No distress.  HENT:  Head: Normocephalic and atraumatic.  Right Ear: Tympanic membrane and external ear normal.  Left Ear: Tympanic membrane and external ear normal.  Nose: Nose  normal.  Mouth/Throat: Posterior oropharyngeal erythema present. No oropharyngeal exudate.  Eyes: Conjunctivae and EOM are normal. Pupils are equal, round, and reactive to light. Right eye exhibits no discharge. Left eye exhibits no discharge. No scleral icterus.  Neck: Normal range of motion. Neck supple. No tracheal deviation present. No thyromegaly present.  Cardiovascular: Normal rate, regular rhythm and normal heart sounds.  Exam reveals no gallop and no friction rub.   No murmur heard. Pulmonary/Chest: Effort normal and breath sounds normal. No accessory muscle usage. Not tachypneic. No respiratory distress. He has no decreased breath sounds. He has no wheezes. He has no rhonchi. He has no rales. He exhibits no tenderness.  Musculoskeletal: Normal range of motion. He exhibits no edema.  Lymphadenopathy:    He has no cervical adenopathy.  Neurological: He is alert and oriented to person, place, and time. No cranial nerve deficit. Coordination normal.  Skin: Skin is warm and dry. No rash noted. He is not diaphoretic. No erythema. No pallor.  Psychiatric: He has a normal mood and affect. His behavior is normal. Judgment and thought content normal.          Assessment & Plan:

## 2013-11-26 NOTE — Assessment & Plan Note (Signed)
Symptoms and exam are consistent with acute pharyngitis. Concerning for strep pharyngitis given recent exposure with his grandson. However, he has partially treated with a course of Augmentin, so culture or rapid test not likely accurate. Will complete course of Augmentin for 10 days. Patient will call if any persistent symptoms of sore throat, fever.

## 2013-11-26 NOTE — Assessment & Plan Note (Signed)
BP Readings from Last 3 Encounters:  11/26/13 160/82  11/06/13 190/90  10/30/13 134/80   Blood pressure elevated today. Encouraged compliance with medications. Plan for follow up with recheck of BP in 1 week.

## 2013-11-26 NOTE — Assessment & Plan Note (Signed)
Very poorly controlled diabetes with recent A1c of 10.2%. Will set up endocrinology evaluation. Encouraged better compliance with medication.

## 2013-11-26 NOTE — Progress Notes (Signed)
Pre-visit discussion using our clinic review tool. No additional management support is needed unless otherwise documented below in the visit note.  

## 2013-11-27 ENCOUNTER — Ambulatory Visit: Payer: Medicare Other | Admitting: Internal Medicine

## 2013-11-27 ENCOUNTER — Telehealth: Payer: Self-pay | Admitting: Internal Medicine

## 2013-11-27 NOTE — Telephone Encounter (Signed)
Relevant patient education assigned to patient using Emmi. ° °

## 2013-11-28 ENCOUNTER — Other Ambulatory Visit: Payer: Self-pay | Admitting: Internal Medicine

## 2013-12-03 ENCOUNTER — Ambulatory Visit: Payer: Medicare Other | Admitting: Internal Medicine

## 2013-12-07 DIAGNOSIS — E1142 Type 2 diabetes mellitus with diabetic polyneuropathy: Secondary | ICD-10-CM | POA: Diagnosis not present

## 2013-12-07 DIAGNOSIS — E1149 Type 2 diabetes mellitus with other diabetic neurological complication: Secondary | ICD-10-CM | POA: Diagnosis not present

## 2013-12-07 DIAGNOSIS — Q828 Other specified congenital malformations of skin: Secondary | ICD-10-CM | POA: Diagnosis not present

## 2013-12-07 DIAGNOSIS — B351 Tinea unguium: Secondary | ICD-10-CM | POA: Diagnosis not present

## 2013-12-20 ENCOUNTER — Encounter: Payer: Self-pay | Admitting: Neurology

## 2013-12-24 ENCOUNTER — Other Ambulatory Visit: Payer: Self-pay | Admitting: Internal Medicine

## 2013-12-25 ENCOUNTER — Other Ambulatory Visit: Payer: Self-pay | Admitting: Internal Medicine

## 2014-01-04 ENCOUNTER — Emergency Department: Payer: Self-pay | Admitting: Emergency Medicine

## 2014-01-04 DIAGNOSIS — I10 Essential (primary) hypertension: Secondary | ICD-10-CM | POA: Diagnosis not present

## 2014-01-04 DIAGNOSIS — S0993XA Unspecified injury of face, initial encounter: Secondary | ICD-10-CM | POA: Diagnosis not present

## 2014-01-04 DIAGNOSIS — R079 Chest pain, unspecified: Secondary | ICD-10-CM | POA: Diagnosis not present

## 2014-01-04 DIAGNOSIS — S339XXA Sprain of unspecified parts of lumbar spine and pelvis, initial encounter: Secondary | ICD-10-CM | POA: Diagnosis not present

## 2014-01-04 DIAGNOSIS — S239XXA Sprain of unspecified parts of thorax, initial encounter: Secondary | ICD-10-CM | POA: Diagnosis not present

## 2014-01-04 DIAGNOSIS — S20219A Contusion of unspecified front wall of thorax, initial encounter: Secondary | ICD-10-CM | POA: Diagnosis not present

## 2014-01-04 DIAGNOSIS — S3981XA Other specified injuries of abdomen, initial encounter: Secondary | ICD-10-CM | POA: Diagnosis not present

## 2014-01-04 DIAGNOSIS — S298XXA Other specified injuries of thorax, initial encounter: Secondary | ICD-10-CM | POA: Diagnosis not present

## 2014-01-04 DIAGNOSIS — IMO0002 Reserved for concepts with insufficient information to code with codable children: Secondary | ICD-10-CM | POA: Diagnosis not present

## 2014-01-04 DIAGNOSIS — S199XXA Unspecified injury of neck, initial encounter: Secondary | ICD-10-CM | POA: Diagnosis not present

## 2014-01-04 LAB — COMPREHENSIVE METABOLIC PANEL
ALT: 17 U/L (ref 12–78)
Albumin: 3.4 g/dL (ref 3.4–5.0)
Alkaline Phosphatase: 84 U/L
Anion Gap: 8 (ref 7–16)
BUN: 16 mg/dL (ref 7–18)
Bilirubin,Total: 0.6 mg/dL (ref 0.2–1.0)
CO2: 28 mmol/L (ref 21–32)
CREATININE: 0.86 mg/dL (ref 0.60–1.30)
Calcium, Total: 9.1 mg/dL (ref 8.5–10.1)
Chloride: 98 mmol/L (ref 98–107)
EGFR (African American): >60
EGFR (Non-African Amer.): >60
Glucose: 374 mg/dL — ABNORMAL HIGH (ref 65–99)
OSMOLALITY: 285 (ref 275–301)
Potassium: 3.4 mmol/L — ABNORMAL LOW (ref 3.5–5.1)
SGOT(AST): 25 U/L (ref 15–37)
SODIUM: 134 mmol/L — AB (ref 136–145)
TOTAL PROTEIN: 6.7 g/dL (ref 6.4–8.2)

## 2014-01-04 LAB — URINALYSIS, COMPLETE
Bacteria: NONE SEEN
Bilirubin,UR: NEGATIVE
LEUKOCYTE ESTERASE: NEGATIVE
Nitrite: NEGATIVE
Ph: 7 (ref 4.5–8.0)
Protein: 100
RBC, UR: NONE SEEN /HPF (ref 0–5)
Specific Gravity: 1.018 (ref 1.003–1.030)
Squamous Epithelial: NONE SEEN

## 2014-01-04 LAB — TROPONIN I: Troponin-I: <0.02 ng/mL

## 2014-01-04 LAB — PROTIME-INR
INR: 1
Prothrombin Time: 13.3 secs (ref 11.5–14.7)

## 2014-01-04 LAB — CBC
HCT: 46.7 % (ref 40.0–52.0)
HGB: 14.7 g/dL (ref 13.0–18.0)
MCH: 22.5 pg — AB (ref 26.0–34.0)
MCHC: 31.4 g/dL — ABNORMAL LOW (ref 32.0–36.0)
MCV: 72 fL — ABNORMAL LOW (ref 80–100)
PLATELETS: 200 10*3/uL (ref 150–440)
RBC: 6.51 10*6/uL — ABNORMAL HIGH (ref 4.40–5.90)
RDW: 18.6 % — ABNORMAL HIGH (ref 11.5–14.5)
WBC: 16.2 10*3/uL — ABNORMAL HIGH (ref 3.8–10.6)

## 2014-01-04 LAB — APTT: Activated PTT: 25.8 secs (ref 23.6–35.9)

## 2014-01-06 ENCOUNTER — Telehealth: Payer: Self-pay | Admitting: Internal Medicine

## 2014-01-06 NOTE — Telephone Encounter (Signed)
1pm next Thursday

## 2014-01-06 NOTE — Telephone Encounter (Signed)
States pt needs f/u from ER visit for fall.  No 30 min available.  Please advise when to schedule.

## 2014-01-06 NOTE — Telephone Encounter (Signed)
Lorriane Shire, can you add him to the schedule on that day per Dr. Gilford Rile

## 2014-01-06 NOTE — Telephone Encounter (Signed)
Fwd to Dr. Gilford Rile, any suggestions

## 2014-01-07 NOTE — Telephone Encounter (Signed)
The patient has been scheduled and is aware of his appointment. °

## 2014-01-08 ENCOUNTER — Telehealth: Payer: Self-pay | Admitting: Internal Medicine

## 2014-01-08 NOTE — Telephone Encounter (Signed)
Pt states she received a letter from [employer] that the pt's cymbalta 60 mg was not approved.  Pt asking what they need to do to get his prescription.  He will be out of medicine in the next week.

## 2014-01-08 NOTE — Telephone Encounter (Signed)
Please advise for patient medication

## 2014-01-08 NOTE — Telephone Encounter (Signed)
Thanks and noted 

## 2014-01-08 NOTE — Telephone Encounter (Signed)
Can we try to initiate a prior authorization with his insurance?

## 2014-01-08 NOTE — Telephone Encounter (Signed)
Steve Phillips, could you begin the PA on this patient and medication please?

## 2014-01-11 NOTE — Telephone Encounter (Signed)
Received PA request form for the cymbalta placed in Dr.walkers box

## 2014-01-13 ENCOUNTER — Encounter: Payer: Self-pay | Admitting: Internal Medicine

## 2014-01-13 ENCOUNTER — Ambulatory Visit (INDEPENDENT_AMBULATORY_CARE_PROVIDER_SITE_OTHER): Payer: Medicare Other | Admitting: Internal Medicine

## 2014-01-13 VITALS — BP 140/80 | HR 66 | Temp 97.6°F | Ht 72.0 in | Wt 246.2 lb

## 2014-01-13 DIAGNOSIS — E1149 Type 2 diabetes mellitus with other diabetic neurological complication: Secondary | ICD-10-CM | POA: Diagnosis not present

## 2014-01-13 MED ORDER — INSULIN GLARGINE 100 UNIT/ML ~~LOC~~ SOLN: SUBCUTANEOUS | Status: DC

## 2014-01-13 MED ORDER — INSULIN ASPART 100 UNIT/ML ~~LOC~~ SOLN: SUBCUTANEOUS | Status: DC

## 2014-01-13 NOTE — Progress Notes (Signed)
Pre visit review using our clinic review tool, if applicable. No additional management support is needed unless otherwise documented below in the visit note. 

## 2014-01-13 NOTE — Patient Instructions (Addendum)
Please decrease the Lantus to 80 units at bedtime. Use the NovoLog as follows: - 20 units for a smaller meal - 25 units for a larger meal - 28 units if you eat out or for a large meal Add the following Sliding Scale: - 150- 165: + 1 unit  - 166- 180: + 2 units  - 181- 195: + 3 units  - 196- 210: + 4 units  - 211- 225: + 5 units  - 226- 240: + 6 units  - 241- 255: + 7 units  - >255: + 8 units Please return in 1 month with your sugar log - you can come here or at the [place] office [phone]).

## 2014-01-13 NOTE — Progress Notes (Signed)
Patient ID: Steve Phillips, male   DOB: 05-21-1946, 68 y.o.   MRN: [id number]  HPI: Steve Phillips is a 68 y.o.-year-old male, referred by his PCP, Dr. Gilford Rile, for management of DM2, insulin-dependent, uncontrolled, with complications (CAD, PN, CKD).  Patient has been diagnosed with diabetes in 1990s; he started on insulin from the dx. He was also in the Accord Study for 8 years. Last hemoglobin A1c was: Lab Results  Component Value Date   HGBA1C 10.4* 11/24/2013   HGBA1C 11.5* 08/31/2013   HGBA1C 10.5* 05/25/2013   Pt is on a regimen of: - Metformin 1000 mg po bid - Lantus 80 units 2x a day - rarely misses doses (vials) - Novolog 20-25 units tid ac (pens)  Pt checks his sugars 2-3x a day and they are: - am: 54 this am - ? (190-300) - 2h after b'fast: n/c - before lunch: 200-350 - 2h after lunch: n/c - before dinner: 200-350 - 2h after dinner: n/c - bedtime: high 200s No lows except 54 this am; he has hypoglycemia awareness at 68.  Highest sugar was HI last week.  Pt's meals are: - Breakfast: 2 eggs, sausage, toast, coffee - Lunch: sandwich or leftovers - Dinner: meat + veggies + bread, tea or milk - Snacks: pm and late at night  - no CKD, last BUN/creatinine:  Lab Results  Component Value Date   BUN 10 11/24/2013   CREATININE 1.0 11/24/2013  On Lisinopril. - last set of lipids: Lab Results  Component Value Date   CHOL 104 02/23/2013   HDL 29.30* 02/23/2013   LDLDIRECT 46.0 02/23/2013   TRIG 300.0* 02/23/2013   CHOLHDL 4 02/23/2013  On crestor. - last eye exam was >1 year ago. No DR.  - + no sensation in legs below knee. He sees his podiatrist q 3 mo ([name]).  Pt has FH of DM in mother.   ROS: Constitutional: no weight gain/loss, + fatigue, + increased appetite, + subjective hypothermia Eyes: no blurry vision, no xerophthalmia ENT: no sore throat, no nodules palpated in throat, no dysphagia/odynophagia, no hoarseness, + hypoacusis Cardiovascular: + CP/no  SOB/palpitations/leg swelling Respiratory: no cough/SOB Gastrointestinal: no N/V/D/C Musculoskeletal: + muscle aches/+ joint aches Skin: no rashes Neurological: no tremors/numbness/tingling/dizziness Psychiatric: + depression/no anxiety Low libido, + pbs with erections  Past Medical History  Diagnosis Date  . Shingles   . Diabetes mellitus   . Hyperlipidemia   . Broken leg     left...s/p pins  . Lower leg pain     chronic ,left leg  . Acute angina   . Depression   . CAD (coronary artery disease)     s/p multiple PCI with stent RCA,LAD and obtuse marginal,followed @ [facility] on the accord study.., cath 02/2012 90% D1, s/p drug-eluting stent Dr. Fletcher [name]  . Falls 09/2011  . Dizziness   . Shortness of breath   . Headache(784.0)   . Hypertension     dr Jerilynn Mages Audelia Acton     [facility] in    Past Surgical History  Procedure Laterality Date  . Appendectomy    . Cholecystectomy    . Left leg surgery    . Left foot surgery    . Drainage port in left testicle    . Cardiac catheterization  02/2012  . Back surgery    . Anterior cervical decomp/discectomy fusion N/A 04/30/2013    Procedure: ANTERIOR CERVICAL DECOMPRESSION/DISCECTOMY FUSION 2 LEVELS;  Surgeon: Faythe Ghee, MD;  Location: [facility];  Service: Neurosurgery;  Laterality: N/A;  Cervical four-five, Cervical six-seven anterior cervical decompression fusion with trabecular metal cage and plate   History   Social History  . Marital Status: Married    Spouse Name: N/A    Number of Children: 3   Occupational History  . retired   Social History Main Topics  . Smoking status: Former Smoker    Quit date: 11/08/1971  . Smokeless tobacco: Never Used  . Alcohol Use: No  . Drug Use: No   Current Outpatient Prescriptions on File Prior to Visit  Medication Sig Dispense Refill  . aspirin 81 MG tablet Take 81 mg by mouth 3 (three) times daily.      . carbidopa-levodopa (SINEMET IR) 25-100 MG per tablet Take 1 tablet by mouth 3  (three) times daily.       . carvedilol (COREG) 25 MG tablet TAKE ONE TABLET BY MOUTH TWICE DAILY  60 tablet  5  . clopidogrel (PLAVIX) 75 MG tablet Take 1 tablet (75 mg total) by mouth daily with breakfast.  90 tablet  3  . DULoxetine (CYMBALTA) 60 MG capsule TAKE 1 CAPSULE DAILY  90 capsule  0  . glucose blood (FREESTYLE TEST STRIPS) test strip Use to check blood sugar up to three times daily  100 each  11  . HYDROcodone-acetaminophen (NORCO/VICODIN) 5-325 MG per tablet Take 1 tablet by mouth every 8 (eight) hours as needed for pain.  90 tablet  0  . isosorbide mononitrate (IMDUR) 30 MG 24 hr tablet Take 1 tablet (30 mg total) by mouth daily.  90 tablet  4  . lisinopril-hydrochlorothiazide (PRINZIDE,ZESTORETIC) 20-12.5 MG per tablet TAKE ONE TABLET BY MOUTH TWICE DAILY  180 tablet  1  . metFORMIN (GLUCOPHAGE) 1000 MG tablet TAKE ONE TABLET BY MOUTH TWICE DAILY  180 tablet  1  . nitroGLYCERIN (NITROSTAT) 0.4 MG SL tablet Place 1 tablet (0.4 mg total) under the tongue every 5 (five) minutes as needed for chest pain.  30 tablet  6  . pantoprazole (PROTONIX) 40 MG tablet Take 1 tablet (40 mg total) by mouth 2 (two) times daily.  180 tablet  3  . pregabalin (LYRICA) 75 MG capsule Take 1 capsule (75 mg total) by mouth 2 (two) times daily.  180 capsule  2  . rosuvastatin (CRESTOR) 10 MG tablet Take 10 mg by mouth daily.      . tamsulosin (FLOMAX) 0.4 MG CAPS capsule Take 1 capsule (0.4 mg total) by mouth daily after breakfast.  90 capsule  4  . tiZANidine (ZANAFLEX) 4 MG tablet Take 1 tablet (4 mg total) by mouth every 8 (eight) hours as needed (pain).  63 tablet  1   No current facility-administered medications on file prior to visit.   Allergies  Allergen Reactions  . Codeine Nausea Only    Derivatives Nausea.  . Tramadol     unknown  . Trazodone And Nefazodone Nausea Only   Family History  Problem Relation Age of Onset  . Cancer Mother     BREAST  . Heart disease Mother     STENT  .  Cancer Father     THROAT  . Heart disease Sister     STENT; HTN  . Heart attack Brother   . Hypertension Brother   . Hypertension Sister    PE: BP 140/80  Pulse 66  Temp(Src) 97.6 F (36.4 C) (Oral)  Ht 6' (1.829 m)  Wt 246 lb 4 oz (111.698 kg)  BMI  33.39 kg/m2  SpO2 97% Wt Readings from Last 3 Encounters:  01/13/14 246 lb 4 oz (111.698 kg)  11/26/13 252 lb (114.306 kg)  11/06/13 250 lb 12.8 oz (113.762 kg)   Constitutional: overweight, in NAD Eyes: PERRLA, EOMI, no exophthalmos ENT: moist mucous membranes, no thyromegaly, no cervical lymphadenopathy Cardiovascular: RRR, No MRG Respiratory: CTA B Gastrointestinal: abdomen soft, NT, ND, BS+ Musculoskeletal: no deformities, strength intact in all 4 Skin: moist, warm, no rashes Neurological: no tremor with outstretched hands, DTR normal in all 4  ASSESSMENT: 1. DM2, insulin-dependent, uncontrolled, with complications - CAD, s/p multiple PCI with stent RCA,LAD and obtuse marginal, followed @ [facility] on the accord study.., cath 02/2012 90% D1, s/p drug-eluting stent - Dr. Fletcher [name] - Peripheral neuropathy - CKD, mild  PLAN:  1. Patient with long-standing, uncontrolled, diabetes, on a large dose of basal insulin + mealtime insulin + metformin, with suboptimal control. He needs more mealtime and less basal insulin: - We discussed about options for treatment, and I suggested to:  Patient Instructions  Please decrease the Lantus to 80 units at bedtime. Use the NovoLog as follows: - 20 units for a smaller meal - 25 units for a larger meal - 28 units if you eat out or for a large meal Add the following Sliding Scale: - 150- 165: + 1 unit  - 166- 180: + 2 units  - 181- 195: + 3 units  - 196- 210: + 4 units  - 211- 225: + 5 units  - 226- 240: + 6 units  - 241- 255: + 7 units  - >255: + 8 units Please return in 1 month with your sugar log - you can come here or at the [place] office [phone]). - Strongly advised him to  start checking sugars at different times of the day - check 3 times a day, rotating checks - given sugar log and advised how to fill it and to bring it at next appt  - given foot care handout and explained the principles  - given instructions for hypoglycemia management "15-15 rule"  - advised for yearly eye exams, he is not up to date but wants to wait for the snow days to be over to go see his eye dr Return in about 1 month (around 02/10/2014).

## 2014-01-14 ENCOUNTER — Ambulatory Visit: Payer: Medicare Other | Admitting: Internal Medicine

## 2014-01-17 ENCOUNTER — Encounter: Payer: Self-pay | Admitting: Neurology

## 2014-01-17 DIAGNOSIS — R269 Unspecified abnormalities of gait and mobility: Secondary | ICD-10-CM | POA: Diagnosis not present

## 2014-01-17 DIAGNOSIS — IMO0001 Reserved for inherently not codable concepts without codable children: Secondary | ICD-10-CM | POA: Diagnosis not present

## 2014-01-17 DIAGNOSIS — M6281 Muscle weakness (generalized): Secondary | ICD-10-CM | POA: Diagnosis not present

## 2014-01-18 ENCOUNTER — Other Ambulatory Visit: Payer: Self-pay | Admitting: *Deleted

## 2014-01-18 MED ORDER — DULOXETINE HCL 60 MG PO CPEP: ORAL_CAPSULE | ORAL | Status: DC

## 2014-01-26 ENCOUNTER — Telehealth: Payer: Self-pay | Admitting: Internal Medicine

## 2014-01-26 MED ORDER — METFORMIN HCL 1000 MG PO TABS: ORAL_TABLET | ORAL | Status: DC

## 2014-01-26 MED ORDER — CARVEDILOL 25 MG PO TABS: ORAL_TABLET | ORAL | Status: DC

## 2014-01-26 NOTE — Telephone Encounter (Signed)
States pt needs refill on metformin 1000 mg 2 x daily  #180 quantity for 90 day supply.  States last refill was sent for 30 day supply.  States it costs the same to have the 90 day supply. Carvedilol 25 mg #180 quantity.    [facility] [place].

## 2014-01-26 NOTE — Telephone Encounter (Signed)
Coreg was sent to pharmacy today for 90 days but the Metformin was done in February for 90 day supply.

## 2014-01-26 NOTE — Telephone Encounter (Signed)
Spoke with patient wife, informed her prescription for Metformin was sent for 90 day supply last time. Per wife Horris Latino they did not dispense but 17. New prescription sent to pharmacy for Metformin.

## 2014-01-27 ENCOUNTER — Ambulatory Visit (INDEPENDENT_AMBULATORY_CARE_PROVIDER_SITE_OTHER): Payer: Medicare Other | Admitting: *Deleted

## 2014-01-27 ENCOUNTER — Other Ambulatory Visit (INDEPENDENT_AMBULATORY_CARE_PROVIDER_SITE_OTHER): Payer: Medicare Other

## 2014-01-27 ENCOUNTER — Telehealth: Payer: Self-pay | Admitting: Internal Medicine

## 2014-01-27 DIAGNOSIS — G20 Parkinson's disease: Secondary | ICD-10-CM | POA: Diagnosis not present

## 2014-01-27 DIAGNOSIS — W19XXXA Unspecified fall, initial encounter: Secondary | ICD-10-CM | POA: Diagnosis not present

## 2014-01-27 DIAGNOSIS — E1149 Type 2 diabetes mellitus with other diabetic neurological complication: Secondary | ICD-10-CM | POA: Diagnosis not present

## 2014-01-27 DIAGNOSIS — E538 Deficiency of other specified B group vitamins: Secondary | ICD-10-CM | POA: Diagnosis not present

## 2014-01-27 LAB — COMPREHENSIVE METABOLIC PANEL
ALT: 19 U/L (ref 0–53)
AST: 28 U/L (ref 0–37)
Albumin: 3.4 g/dL — ABNORMAL LOW (ref 3.5–5.2)
Alkaline Phosphatase: 90 U/L (ref 39–117)
BILIRUBIN TOTAL: 0.8 mg/dL (ref 0.3–1.2)
BUN: 12 mg/dL (ref 6–23)
CO2: 21 meq/L (ref 19–32)
Calcium: 8.9 mg/dL (ref 8.4–10.5)
Chloride: 97 mEq/L (ref 96–112)
Creatinine, Ser: 1 mg/dL (ref 0.4–1.5)
GFR: 75.98 mL/min (ref >60.00)
GLUCOSE: 432 mg/dL — AB (ref 70–99)
Potassium: 3.8 mEq/L (ref 3.5–5.1)
Sodium: 132 mEq/L — ABNORMAL LOW (ref 135–145)
TOTAL PROTEIN: 5.9 g/dL — AB (ref 6.0–8.3)

## 2014-01-27 LAB — HEMOGLOBIN A1C: Hgb A1c MFr Bld: 10.7 % — ABNORMAL HIGH (ref 4.6–6.5)

## 2014-01-27 MED ORDER — CYANOCOBALAMIN 1000 MCG/ML IJ SOLN
1000.0000 ug | Freq: Once | INTRAMUSCULAR | Status: AC
  Administered 2014-01-27: 1000 ug via INTRAMUSCULAR

## 2014-01-27 NOTE — Telephone Encounter (Signed)
States pt is coming this afternoon for blood work.  Asking if he can have his PSA checked along with any other labs Dr. Gilford Rile needs to check.  Also asking if he could his B12.

## 2014-01-27 NOTE — Telephone Encounter (Signed)
He is coming in today, could you give him his B-12 and these are the labs he will be getting.

## 2014-01-27 NOTE — Telephone Encounter (Signed)
That is fine. Needs CMP, A1c Lipids, PSA, and may have B12 shot.

## 2014-01-27 NOTE — Telephone Encounter (Signed)
Please advise 

## 2014-01-28 LAB — HM DIABETES EYE EXAM

## 2014-02-01 NOTE — Progress Notes (Signed)
[facility] referral underway. Patient does in deed have f/u with [facility].

## 2014-02-04 ENCOUNTER — Encounter: Payer: Self-pay | Admitting: Internal Medicine

## 2014-02-04 ENCOUNTER — Ambulatory Visit (INDEPENDENT_AMBULATORY_CARE_PROVIDER_SITE_OTHER): Payer: Medicare Other | Admitting: Internal Medicine

## 2014-02-04 ENCOUNTER — Ambulatory Visit: Payer: Medicare Other | Admitting: Internal Medicine

## 2014-02-04 VITALS — BP 128/70 | HR 59 | Temp 97.7°F | Wt 252.0 lb

## 2014-02-04 DIAGNOSIS — R269 Unspecified abnormalities of gait and mobility: Secondary | ICD-10-CM

## 2014-02-04 DIAGNOSIS — E785 Hyperlipidemia, unspecified: Secondary | ICD-10-CM

## 2014-02-04 DIAGNOSIS — Z125 Encounter for screening for malignant neoplasm of prostate: Secondary | ICD-10-CM | POA: Diagnosis not present

## 2014-02-04 DIAGNOSIS — E1149 Type 2 diabetes mellitus with other diabetic neurological complication: Secondary | ICD-10-CM | POA: Diagnosis not present

## 2014-02-04 DIAGNOSIS — I10 Essential (primary) hypertension: Secondary | ICD-10-CM

## 2014-02-04 DIAGNOSIS — R972 Elevated prostate specific antigen [PSA]: Secondary | ICD-10-CM

## 2014-02-04 DIAGNOSIS — R51 Headache: Secondary | ICD-10-CM

## 2014-02-04 DIAGNOSIS — Z1211 Encounter for screening for malignant neoplasm of colon: Secondary | ICD-10-CM

## 2014-02-04 LAB — PSA, MEDICARE: PSA: 4.3 ng/ml — ABNORMAL HIGH (ref 0.10–4.00)

## 2014-02-04 LAB — HM COLONOSCOPY

## 2014-02-04 MED ORDER — PREGABALIN 150 MG PO CAPS: 150.0000 mg | ORAL_CAPSULE | Freq: Two times a day (BID) | ORAL | Status: DC

## 2014-02-04 NOTE — Assessment & Plan Note (Signed)
Lab Results  Component Value Date   PSA 4.30* 02/04/2014   PSA 5.07* 09/04/2013   PSA stable over last 6 months. Will continue to monitor.

## 2014-02-04 NOTE — Assessment & Plan Note (Signed)
BP Readings from Last 3 Encounters:  02/04/14 128/70  01/13/14 140/80  11/26/13 160/82   BP well controlled today. Continue current medications.

## 2014-02-04 NOTE — Assessment & Plan Note (Signed)
Pt has been unable to have colonoscopy because of use of Plavix and multiple other ongoing medical issues. Will set up Cologuard screening.

## 2014-02-04 NOTE — Progress Notes (Signed)
Subjective:    Patient ID: Steve Phillips, male    DOB: 1946-05-28, 67 y.o.   MRN: [id number]  HPI 68YO male presents for follow up.  DM - Recently seen by endocrine. Blood sugars 90s-250s in morning. Not consistently following regimen. Follow up with Endo in 1 month. Wife reports poor compliance with diet.  Fell in 12/2013. Slipped off porch onto left shoulder. He was unable to walk because of pain. Called EMS. Seen in ED, BG was "high." given Fentanyl for pain. Xrays negative for fracture. Continues to have shoulder and neck pain. Using Zanaflex with some improvement.  Neuropathy - Recently seen by Dr. Manuella Ghazi. 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 Started on PT. Increased Lyrica to 150mg  bid.  Review of Systems  Constitutional: Negative for fever, chills, activity change, appetite change, fatigue and unexpected weight change.  Eyes: Negative for visual disturbance.  Respiratory: Negative for cough and shortness of breath.   Cardiovascular: Negative for chest pain, palpitations and leg swelling.  Gastrointestinal: Negative for abdominal pain and abdominal distention.  Genitourinary: Negative for dysuria, urgency and difficulty urinating.  Musculoskeletal: Negative for arthralgias and gait problem.  Skin: Negative for color change and rash.  Hematological: Negative for adenopathy.  Psychiatric/Behavioral: Negative for sleep disturbance and dysphoric mood. The patient is not nervous/anxious.        Objective:    BP 128/70  Pulse 59  Temp(Src) 97.7 F (36.5 C) (Oral)  Wt 252 lb (114.306 kg)  SpO2 97% Physical Exam  Constitutional: He is oriented to person, place, and time. He appears well-developed and well-nourished. No distress.  HENT:  Head: Normocephalic and atraumatic.  Right Ear: External ear normal.  Left Ear: External ear normal.  Nose: Nose normal.  Mouth/Throat: Oropharynx is clear and moist. No oropharyngeal exudate.  Eyes: Conjunctivae and EOM are normal. Pupils are equal,  round, and reactive to light. Right eye exhibits no discharge. Left eye exhibits no discharge. No scleral icterus.  Neck: Normal range of motion. Neck supple. No tracheal deviation present. No thyromegaly present.  Cardiovascular: Normal rate, regular rhythm and normal heart sounds.  Exam reveals no gallop and no friction rub.   No murmur heard. Pulmonary/Chest: Effort normal and breath sounds normal. No accessory muscle usage. Not tachypneic. No respiratory distress. He has no decreased breath sounds. He has no wheezes. He has no rhonchi. He has no rales. He exhibits no tenderness.  Musculoskeletal: Normal range of motion. He exhibits no edema.  Lymphadenopathy:    He has no cervical adenopathy.  Neurological: He is alert and oriented to person, place, and time. He displays atrophy. A sensory deficit is present. No cranial nerve deficit. He exhibits abnormal muscle tone. Coordination and gait abnormal.  Skin: Skin is warm and dry. No rash noted. He is not diaphoretic. No erythema. No pallor.  Psychiatric: He has a normal mood and affect. His behavior is normal. Judgment and thought content normal.          Assessment & Plan:   Problem List Items Addressed This Visit   DM (diabetes mellitus), type 2 with neurological complications - Primary     Very poorly controlled DM with A1c >10% over 1 year. Recent endocrinology evaluation. Recommended use of SSI with Lantus. Pt is poorly compliant with this. Wife reports persistent overeating. Pilar Plate discussion today about numerous ongoing medical issues, neuropathy, CAD all of which are related to DM. Encouraged compliance with treatment. [facility] referral placed for home support.  Elevated PSA, less than 10 ng/ml      Lab Results  Component Value Date   PSA 4.30* 02/04/2014   PSA 5.07* 09/04/2013   PSA stable over last 6 months. Will continue to monitor.    Relevant Orders      PSA, Medicare (Completed)   Gait abnormality     Numerous falls  secondary to gait instability from neuropathy. Symptoms progressive with uncontrolled DM. Encouraged use of cane or walking for falls prevention.    Headache(784.0)   Relevant Medications      pregabalin (LYRICA) capsule   Hyperlipidemia   Hypertension      BP Readings from Last 3 Encounters:  02/04/14 128/70  01/13/14 140/80  11/26/13 160/82   BP well controlled today. Continue current medications.    Screening for colon cancer     Pt has been unable to have colonoscopy because of use of Plavix and multiple other ongoing medical issues. Will set up Cologuard screening.        Return in about 3 months (around 05/07/2014) for Recheck.

## 2014-02-04 NOTE — Assessment & Plan Note (Signed)
Very poorly controlled DM with A1c >10% over 1 year. Recent endocrinology evaluation. Recommended use of SSI with Lantus. Pt is poorly compliant with this. Wife reports persistent overeating. Pilar Plate discussion today about numerous ongoing medical issues, neuropathy, CAD all of which are related to DM. Encouraged compliance with treatment. [facility] referral placed for home support.

## 2014-02-04 NOTE — Assessment & Plan Note (Signed)
Numerous falls secondary to gait instability from neuropathy. Symptoms progressive with uncontrolled DM. Encouraged use of cane or walking for falls prevention.

## 2014-02-09 ENCOUNTER — Ambulatory Visit (INDEPENDENT_AMBULATORY_CARE_PROVIDER_SITE_OTHER): Payer: Medicare Other | Admitting: Internal Medicine

## 2014-02-09 ENCOUNTER — Encounter: Payer: Self-pay | Admitting: Internal Medicine

## 2014-02-09 VITALS — BP 142/80 | HR 79 | Temp 97.4°F | Resp 12 | Wt 248.0 lb

## 2014-02-09 DIAGNOSIS — E1149 Type 2 diabetes mellitus with other diabetic neurological complication: Secondary | ICD-10-CM

## 2014-02-09 MED ORDER — INSULIN ASPART 100 UNIT/ML ~~LOC~~ SOLN: SUBCUTANEOUS | Status: DC

## 2014-02-09 MED ORDER — INSULIN GLARGINE 100 UNIT/ML ~~LOC~~ SOLN: SUBCUTANEOUS | Status: DC

## 2014-02-09 NOTE — Progress Notes (Signed)
Patient ID: Steve Phillips, male   DOB: 1946-07-01, 68 y.o.   MRN: [id number]  HPI: Steve Phillips is a 68 y.o.-year-old male, returning for f/u for DM2, dx 1990's, insulin-dependent, uncontrolled, with complications (CAD, PN, CKD). Last visit 1 mo.  He was also in the Accord Study for 8 years. Last hemoglobin A1c was: Lab Results  Component Value Date   HGBA1C 10.7* 01/27/2014   HGBA1C 10.4* 11/24/2013   HGBA1C 11.5* 08/31/2013   Pt wais on a regimen of: - Metformin 1000 mg po bid - Lantus 80 units 2x a day (vials) - Novolog 20-25 units tid ac (pens)  Pt is now on: - Lantus to 80 units at bedtime. - NovoLog as follows: - 20 units for a smaller meal - 25 units for a larger meal - 28 units if you eat out or for a large meal - most of the times, he is injecting  - following Sliding Scale: - 150- 165: + 1 unit  - 166- 180: + 2 units  - 181- 195: + 3 units  - 196- 210: + 4 units  - 211- 225: + 5 units  - 226- 240: + 6 units  - 241- 255: + 7 units  - >255: + 8 units  Pt checks his sugars 2-3x a day and they are improved - especially in last week - per his log: - am: 54 this am - ? (190-300) >> 94-213 - 2h after b'fast: n/c >> 191, 266, 340, 356 - before lunch: 200-350 >> 179, 197 - 2h after lunch: n/c  >> 262 - before dinner: 200-350 >> 129, 156, 214, 216 - 2h after dinner: n/c >> 234-375, most low 200s - bedtime: high 200s >> 234, 266, 275 No lows except 94 this am; he has hypoglycemia awareness at 68.  Highest sugar was 300s.  Pt's meals are: - Breakfast: 2 eggs, sausage, toast, coffee - Lunch: sandwich or leftovers - Dinner: meat + veggies + bread, tea or milk - Snacks: pm and late at night  - no CKD, last BUN/creatinine:  Lab Results  Component Value Date   BUN 12 01/27/2014   CREATININE 1.0 01/27/2014  On Lisinopril. - last set of lipids: Lab Results  Component Value Date   CHOL 104 02/23/2013   HDL 29.30* 02/23/2013   LDLDIRECT 46.0 02/23/2013   TRIG 300.0* 02/23/2013   CHOLHDL 4 02/23/2013  On crestor. - last eye exam was 01/28/2014 ([name]). No DR.  - + no sensation in legs below knee. He sees his podiatrist q 3 mo ([name]).  I reviewed pt's medications, allergies, PMH, social hx, family hx and no changes required, except as mentioned above. She changed the dose of Lyrica: 150 bid now. Stopped Flomax.   ROS: Constitutional: no weight gain/loss, + fatigue, + increased appetite, no subjective hypothermia/hyper Eyes: no blurry vision, no xerophthalmia ENT: no sore throat, no nodules palpated in throat, no dysphagia/odynophagia, no hoarseness, + hypoacusis Cardiovascular: no CP/no SOB/palpitations/leg swelling Respiratory: no cough/SOB Gastrointestinal: no N/V/D/C Musculoskeletal: + muscle aches/no joint aches Skin: no rashes, + easy bruising Neurological: no tremors/+numbness - up to knees/tingling/dizziness, + HA Low libido, + pbs with erections  PE: BP 142/80  Pulse 79  Temp(Src) 97.4 F (36.3 C) (Oral)  Resp 12  Wt 248 lb (112.492 kg)  SpO2 96% Wt Readings from Last 3 Encounters:  02/09/14 248 lb (112.492 kg)  02/04/14 252 lb (114.306 kg)  01/13/14 246 lb 4 oz (  111.698 kg)   Constitutional: overweight, in NAD Eyes: PERRLA, EOMI, no exophthalmos ENT: moist mucous membranes, no thyromegaly, no cervical lymphadenopathy Cardiovascular: RRR, No MRG Respiratory: CTA B Gastrointestinal: abdomen soft, NT, ND, BS+ Musculoskeletal: no deformities, strength intact in all 4 Skin: moist, warm, no rashes  ASSESSMENT: 1. DM2, insulin-dependent, uncontrolled, with complications - CAD, s/p multiple PCI with stent RCA,LAD and obtuse marginal, followed @ [facility] on the accord study.., cath 02/2012 90% D1, s/p drug-eluting stent - Dr. Fletcher [name] - Peripheral neuropathy - CKD, mild  PLAN:  1. Patient with long-standing, uncontrolled, diabetes, on a large dose of basal insulin + mealtime insulin + metformin, now with improved control.  - We  discussed about options for treatment, and I suggested to:    Patient Instructions  Please continue Metformin 1000 mg 2x a day. Decrease Lantus to 70 units at bedtime. Change NovoLog as follows: - 25 units for a smaller meal - 30 units for a regular meal - 33 units for a large meal - NovoLog Sliding Scale: - 150- 165: + 1 unit  - 166- 180: + 2 units  - 181- 195: + 3 units  - 196- 210: + 4 units  - 211- 225: + 5 units  - > 225: + 6 units  - refilled NovoLog pens - given NovoLog samples - continue checking sugars at different times of the day - check 3 times a day, rotating checks - given new sugar logs  - up to date with eye exams Return in about 2 months (around 04/11/2014).

## 2014-02-09 NOTE — Patient Instructions (Signed)
Please continue Metformin 1000 mg 2x a day. Decrease Lantus to 70 units at bedtime. Change NovoLog as follows: - 25 units for a smaller meal - 30 units for a regular meal - 33 units for a large meal - NovoLog Sliding Scale: - 150- 165: + 1 unit  - 166- 180: + 2 units  - 181- 195: + 3 units  - 196- 210: + 4 units  - 211- 225: + 5 units  - > 225: + 6 units

## 2014-02-17 ENCOUNTER — Encounter: Payer: Self-pay | Admitting: Neurology

## 2014-02-17 DIAGNOSIS — IMO0001 Reserved for inherently not codable concepts without codable children: Secondary | ICD-10-CM | POA: Diagnosis not present

## 2014-02-17 DIAGNOSIS — R269 Unspecified abnormalities of gait and mobility: Secondary | ICD-10-CM | POA: Diagnosis not present

## 2014-02-17 DIAGNOSIS — M6281 Muscle weakness (generalized): Secondary | ICD-10-CM | POA: Diagnosis not present

## 2014-03-18 ENCOUNTER — Telehealth: Payer: Self-pay | Admitting: Internal Medicine

## 2014-03-18 NOTE — Telephone Encounter (Signed)
BS log- 4/24- AM 250 PM 388 4/26-midday 200 PM 293 4/27 PM 371 after dinner, at bedtime 377 4/28 AM 220 PM 302 4/29 before dinner 257 4/30 220 AM  Should pt come in sooner to be seen?

## 2014-03-18 NOTE — Telephone Encounter (Signed)
Please see note below and advise  

## 2014-03-24 ENCOUNTER — Telehealth: Payer: Self-pay | Admitting: *Deleted

## 2014-03-24 NOTE — Telephone Encounter (Signed)
Please see note below and advise  

## 2014-03-24 NOTE — Telephone Encounter (Signed)
Opened encounter in error  

## 2014-03-25 ENCOUNTER — Telehealth: Payer: Self-pay | Admitting: *Deleted

## 2014-03-25 NOTE — Telephone Encounter (Signed)
Returned call and spoke with pt's wife. Advised pt per Dr Cruzita Lederer: Please continue Metformin 1000 mg 2x a day.  Continue Lantus 70 units at bedtime.  Change NovoLog as follows:  - 25 >> 30 units for a smaller meal  - 30 >> 35 units for a regular meal  - 33 >> 38 units for a large meal  Continue NovoLog Sliding Scale:  - 150- 165: + 1 unit  - 166- 180: + 2 units  - 181- 195: + 3 units  - 196- 210: + 4 units  - 211- 225: + 5 units  - > 225: + 6 units  Let us know about sugars in 1 week. She understood.

## 2014-03-25 NOTE — Telephone Encounter (Signed)
Please continue Metformin 1000 mg 2x a day.  Continue Lantus 70 units at bedtime.  Change NovoLog as follows:  - 25 >> 30 units for a smaller meal  - 30 >> 35 units for a regular meal  - 33 >> 38 units for a large meal  Continue NovoLog Sliding Scale:  - 150- 165: + 1 unit  - 166- 180: + 2 units  - 181- 195: + 3 units  - 196- 210: + 4 units  - 211- 225: + 5 units  - > 225: + 6 units  Let us know about sugars in 1 week.

## 2014-04-07 ENCOUNTER — Ambulatory Visit (INDEPENDENT_AMBULATORY_CARE_PROVIDER_SITE_OTHER): Payer: Medicare Other | Admitting: *Deleted

## 2014-04-07 DIAGNOSIS — E538 Deficiency of other specified B group vitamins: Secondary | ICD-10-CM | POA: Diagnosis not present

## 2014-04-07 MED ORDER — CYANOCOBALAMIN 1000 MCG/ML IJ SOLN
1000.0000 ug | Freq: Once | INTRAMUSCULAR | Status: AC
  Administered 2014-04-07: 1000 ug via INTRAMUSCULAR

## 2014-04-13 ENCOUNTER — Encounter: Payer: Self-pay | Admitting: Internal Medicine

## 2014-04-13 ENCOUNTER — Ambulatory Visit (INDEPENDENT_AMBULATORY_CARE_PROVIDER_SITE_OTHER): Payer: Medicare Other | Admitting: Internal Medicine

## 2014-04-13 VITALS — BP 126/84 | HR 69 | Temp 98.2°F | Resp 12 | Wt 248.0 lb

## 2014-04-13 DIAGNOSIS — E1149 Type 2 diabetes mellitus with other diabetic neurological complication: Secondary | ICD-10-CM

## 2014-04-13 DIAGNOSIS — E1165 Type 2 diabetes mellitus with hyperglycemia: Secondary | ICD-10-CM

## 2014-04-13 DIAGNOSIS — IMO0001 Reserved for inherently not codable concepts without codable children: Secondary | ICD-10-CM | POA: Diagnosis not present

## 2014-04-13 DIAGNOSIS — IMO0002 Reserved for concepts with insufficient information to code with codable children: Secondary | ICD-10-CM

## 2014-04-13 MED ORDER — GLUCOSE BLOOD VI STRP: ORAL_STRIP | Status: DC

## 2014-04-13 NOTE — Patient Instructions (Addendum)
Please continue Metformin 1000 mg 2x a day.  Continue Lantus 70 units at bedtime.  Change NovoLog as follows:  - 35 units for a smaller meal  - 38  units for a regular meal  - 40 units for a large meal  Continue NovoLog Sliding Scale:  - 150- 165: + 1 unit  - 166- 180: + 2 units  - 181- 195: + 3 units  - 196- 210: + 4 units  - 211- 225: + 5 units  - > 225: + 6 units   Please try to improve your diet.  Please return in 1-1.5 month with your sugar log.

## 2014-04-13 NOTE — Progress Notes (Signed)
Patient ID: Steve Phillips, male   DOB: [birth year], 68 y.o.   MRN: [id number]  HPI: Steve Phillips is a 68 y.o.-year-old male, returning for f/u for DM2, dx 1990's, insulin-dependent, uncontrolled, with complications (CAD, PN, CKD). Last visit 2 mo ago.  He was also in the [facility] Study for 8 years. Last hemoglobin A1c was: Lab Results  Component Value Date   HGBA1C 10.7* 01/27/2014   HGBA1C 10.4* 11/24/2013   HGBA1C 11.5* 08/31/2013   Pt was on a regimen of: - Metformin 1000 mg po bid - Lantus 80 units 2x a day (vials) - Novolog 20-25 units tid ac (pens)  Pt is now on: - see below - takes about 44 units with every meal  Reviewed tel notes since last visit: Lucius Conn, CMA at 07/25/1883 8:46 AM     Status: Signed        Returned call and spoke with pt's wife. Advised pt per Dr Cruzita Lederer:  Please continue Metformin 1000 mg 2x a day.  Continue Lantus 70 units at bedtime.  Change NovoLog as follows:  - 25 >> 30 units for a smaller meal  - 30 >> 35 units for a regular meal  - 33 >> 38 units for a large meal  Continue NovoLog Sliding Scale:  - 150- 165: + 1 unit  - 166- 180: + 2 units  - 181- 195: + 3 units  - 196- 210: + 4 units  - 211- 225: + 5 units  - > 225: + 6 units  Let us know about sugars in 1 week. She understood.    Pt checks his sugars 2-3x a day and they are - per his log: - am: 54 this am - ? (190-300) >> 94-213 >> 102-286 - 2h after b'fast: n/c >> 191, 266, 340, 356 >> 220-312 - before lunch: 200-350 >> 179, 197 >> 200-231 - 2h after lunch: n/c  >> 262 >> 302-403 - before dinner: 200-350 >> 129, 156, 214, 216 >> (71) 102-293 - 2h after dinner: n/c >> 234-375, most low 200s >> 229-388 - bedtime: high 200s >> 234, 266, 275 >> 256-473 No lows except 72; he has hypoglycemia awareness at 68.  Highest sugar was 473s.  Pt's meals are: - Breakfast: 2 eggs, sausage, toast, coffee - Lunch: sandwich or leftovers - Dinner: meat + veggies + bread, tea or milk - Snacks:  pm and late at night  - no CKD, last BUN/creatinine:  Lab Results  Component Value Date   BUN 12 01/27/2014   CREATININE 1.0 01/27/2014  On Lisinopril. - last set of lipids: Lab Results  Component Value Date   CHOL 104 02/23/2013   HDL 29.30* 02/23/2013   LDLDIRECT 46.0 02/23/2013   TRIG 300.0* 02/23/2013   CHOLHDL 4 02/23/2013  On crestor. - last eye exam was 01/28/2014 ([name]). No DR.  - + no sensation in legs below knee. He sees his podiatrist q 3 mo ([name]).  I reviewed pt's medications, allergies, PMH, social hx, family hx and no changes required, except as mentioned above. She changed the dose of Lyrica: 150 bid now. Stopped Flomax.   ROS: Constitutional: no weight gain/loss, + fatigue, + increased appetite, no subjective hypothermia/hyper Eyes: no blurry vision, no xerophthalmia ENT: no sore throat, no nodules palpated in throat, no dysphagia/odynophagia, no hoarseness, + hypoacusis Cardiovascular: no CP/no SOB/palpitations/leg swelling Respiratory: no cough/SOB Gastrointestinal: no N/V/D/C Musculoskeletal: + muscle aches/no joint aches Skin: no rashes, +  easy bruising Neurological: no tremors/+ numbness - up to knees/tingling/dizziness L PE: BP 126/84  Pulse 69  Temp(Src) 98.2 F (36.8 C) (Oral)  Resp 12  Wt 248 lb (112.492 kg)  SpO2 95% Wt Readings from Last 3 Encounters:  04/13/14 248 lb (112.492 kg)  02/09/14 248 lb (112.492 kg)  02/04/14 252 lb (114.306 kg)   Constitutional: overweight, in NAD Eyes: PERRLA, EOMI, no exophthalmos ENT: moist mucous membranes, no thyromegaly, no cervical lymphadenopathy Cardiovascular: RRR, No MRG Respiratory: CTA B Gastrointestinal: abdomen soft, NT, ND, BS+ Musculoskeletal: no deformities, strength intact in all 4 Skin: moist, warm, no rashes  ASSESSMENT: 1. DM2, insulin-dependent, uncontrolled, with complications - CAD, s/p multiple PCI with stent RCA,LAD and obtuse marginal, followed @ [facility] on the accord  study.., cath 02/2012 90% D1, s/p drug-eluting stent - Dr. Fletcher [name] - Peripheral neuropathy - CKD, mild  PLAN:  1. Patient with long-standing, uncontrolled, diabetes, on a large dose of basal insulin + mealtime insulin + metformin, with still poor control.  - we had again a log discussion about the absolute necessity to improve diet. - We discussed about options for treatment, and I suggested to:  Patient Instructions  Please continue Metformin 1000 mg 2x a day.  Continue Lantus 70 units at bedtime.  Increase NovoLog as follows:  - 35 units for a smaller meal  - 38  units for a regular meal  - 40 units for a large meal  Continue NovoLog Sliding Scale:  - 150- 165: + 1 unit  - 166- 180: + 2 units  - 181- 195: + 3 units  - 196- 210: + 4 units  - 211- 225: + 5 units  - > 225: + 6 units  Please try to improve your diet as we discussed Please return in 1-1.5 month with your sugar log.  - refilled AccuChek strips - given new sugar logs - continue checking sugars at different times of the day - check 3 times a day, rotating checks - given new sugar logs  - up to date with eye exams

## 2014-04-28 ENCOUNTER — Telehealth: Payer: Self-pay | Admitting: *Deleted

## 2014-04-28 DIAGNOSIS — IMO0002 Reserved for concepts with insufficient information to code with codable children: Secondary | ICD-10-CM

## 2014-04-28 DIAGNOSIS — E1165 Type 2 diabetes mellitus with hyperglycemia: Secondary | ICD-10-CM

## 2014-04-28 MED ORDER — GLUCOSE BLOOD VI STRP: ORAL_STRIP | Status: DC

## 2014-04-28 NOTE — Telephone Encounter (Signed)
Refill Request  Freestyle strips

## 2014-05-03 DIAGNOSIS — B351 Tinea unguium: Secondary | ICD-10-CM | POA: Diagnosis not present

## 2014-05-03 DIAGNOSIS — I739 Peripheral vascular disease, unspecified: Secondary | ICD-10-CM | POA: Diagnosis not present

## 2014-05-03 DIAGNOSIS — E1159 Type 2 diabetes mellitus with other circulatory complications: Secondary | ICD-10-CM | POA: Diagnosis not present

## 2014-05-03 DIAGNOSIS — D2370 Other benign neoplasm of skin of unspecified lower limb, including hip: Secondary | ICD-10-CM | POA: Diagnosis not present

## 2014-05-07 LAB — HM DIABETES FOOT EXAM

## 2014-05-12 ENCOUNTER — Ambulatory Visit: Payer: Medicare Other | Admitting: Internal Medicine

## 2014-05-14 ENCOUNTER — Encounter: Payer: Self-pay | Admitting: Internal Medicine

## 2014-05-14 ENCOUNTER — Ambulatory Visit (INDEPENDENT_AMBULATORY_CARE_PROVIDER_SITE_OTHER): Payer: Medicare Other | Admitting: Internal Medicine

## 2014-05-14 VITALS — BP 134/88 | HR 58 | Temp 98.2°F | Ht 72.0 in | Wt 250.0 lb

## 2014-05-14 DIAGNOSIS — R51 Headache: Secondary | ICD-10-CM | POA: Diagnosis not present

## 2014-05-14 DIAGNOSIS — Z23 Encounter for immunization: Secondary | ICD-10-CM | POA: Diagnosis not present

## 2014-05-14 DIAGNOSIS — L97529 Non-pressure chronic ulcer of other part of left foot with unspecified severity: Secondary | ICD-10-CM

## 2014-05-14 DIAGNOSIS — E785 Hyperlipidemia, unspecified: Secondary | ICD-10-CM | POA: Diagnosis not present

## 2014-05-14 DIAGNOSIS — E538 Deficiency of other specified B group vitamins: Secondary | ICD-10-CM

## 2014-05-14 DIAGNOSIS — L97519 Non-pressure chronic ulcer of other part of right foot with unspecified severity: Secondary | ICD-10-CM

## 2014-05-14 DIAGNOSIS — E1149 Type 2 diabetes mellitus with other diabetic neurological complication: Secondary | ICD-10-CM | POA: Diagnosis not present

## 2014-05-14 DIAGNOSIS — I10 Essential (primary) hypertension: Secondary | ICD-10-CM

## 2014-05-14 DIAGNOSIS — E11621 Type 2 diabetes mellitus with foot ulcer: Secondary | ICD-10-CM

## 2014-05-14 DIAGNOSIS — E1169 Type 2 diabetes mellitus with other specified complication: Secondary | ICD-10-CM

## 2014-05-14 DIAGNOSIS — M5010 Cervical disc disorder with radiculopathy, unspecified cervical region: Secondary | ICD-10-CM | POA: Insufficient documentation

## 2014-05-14 DIAGNOSIS — M5412 Radiculopathy, cervical region: Secondary | ICD-10-CM

## 2014-05-14 DIAGNOSIS — L97509 Non-pressure chronic ulcer of other part of unspecified foot with unspecified severity: Secondary | ICD-10-CM

## 2014-05-14 LAB — HEMOGLOBIN A1C
Hgb A1c MFr Bld: 10.1 % — ABNORMAL HIGH (ref <5.7)
Mean Plasma Glucose: 243 mg/dL — ABNORMAL HIGH (ref <117)

## 2014-05-14 MED ORDER — HYDROCODONE-ACETAMINOPHEN 5-325 MG PO TABS: 1.0000 | ORAL_TABLET | Freq: Three times a day (TID) | ORAL | Status: DC | PRN

## 2014-05-14 MED ORDER — TIZANIDINE HCL 4 MG PO TABS: 4.0000 mg | ORAL_TABLET | Freq: Three times a day (TID) | ORAL | Status: DC | PRN

## 2014-05-14 MED ORDER — INSULIN ASPART 100 UNIT/ML FLEXPEN: 46.0000 [IU] | PEN_INJECTOR | Freq: Three times a day (TID) | SUBCUTANEOUS | Status: DC

## 2014-05-14 MED ORDER — CYANOCOBALAMIN 1000 MCG/ML IJ SOLN
1000.0000 ug | Freq: Once | INTRAMUSCULAR | Status: AC
  Administered 2014-05-14: 1000 ug via INTRAMUSCULAR

## 2014-05-14 NOTE — Assessment & Plan Note (Signed)
Will check A1c with labs today. Continue current medications. Follow up with Dr. Cruzita Lederer next week as scheduled.

## 2014-05-14 NOTE — Assessment & Plan Note (Signed)
Will check lipids and LFTs with labs. Continue Crestor.

## 2014-05-14 NOTE — Patient Instructions (Signed)
We will check labs today.  Follow up with Dr. Cruzita Lederer as scheduled.  Follow up here in 3 months or as needed.

## 2014-05-14 NOTE — Assessment & Plan Note (Signed)
Progressive right arm pain and weakness c/w known h/p cervical radiculopathy. Will set up follow up with Dr. Hal Neer in [place]. Continue prn Hydrocodone for severe pain only.

## 2014-05-14 NOTE — Progress Notes (Signed)
Pre visit review using our clinic review tool, if applicable. No additional management support is needed unless otherwise documented below in the visit note. 

## 2014-05-14 NOTE — Assessment & Plan Note (Signed)
Continues to follow up with Dr. Vickki Muff for foot ulceration. Will request recent notes.

## 2014-05-14 NOTE — Assessment & Plan Note (Signed)
BP Readings from Last 3 Encounters:  05/14/14 134/88  04/13/14 126/84  02/09/14 142/80   BP well controlled on current medications. Will continue.

## 2014-05-14 NOTE — Progress Notes (Signed)
Subjective:    Patient ID: Steve Phillips, male    DOB: 1946-08-21, 68 y.o.   MRN: [id number]  HPI 68YO male presents for follow up.  DM - BG well controlled per report. Lantus was decreased to 70units at night by Endocrinology.  Also using Novolog 46units with meals.  Saw podiatrist, Dr. Vickki Muff, last week. Wound on right foot debrided. Follow up scheduled.  Feels that balance is improving. No recent falls. No longer using a cane daily.  Having numbness in right hand. Notes decreased grip strength over last several months. Also having soreness in forearm and hand. Symptoms becoming progressively worse over last few weeks. Using occasional Hydrocodone for severe pain.  Outpatient Encounter Prescriptions as of 05/14/2014  Medication Sig  . aspirin 81 MG tablet Take 81 mg by mouth 3 (three) times daily.  . carbidopa-levodopa (SINEMET IR) 25-100 MG per tablet Take 1 tablet by mouth 3 (three) times daily.   . carvedilol (COREG) 25 MG tablet TAKE ONE TABLET BY MOUTH TWICE DAILY  . clopidogrel (PLAVIX) 75 MG tablet Take 1 tablet (75 mg total) by mouth daily with breakfast.  . cyanocobalamin (,VITAMIN B-12,) 1000 MCG/ML injection Inject 1,000 mcg into the muscle once.  . DULoxetine (CYMBALTA) 60 MG capsule TAKE 1 CAPSULE DAILY  . glucose blood (FREESTYLE TEST STRIPS) test strip Use to check blood sugar up to three times daily  . HYDROcodone-acetaminophen (NORCO/VICODIN) 5-325 MG per tablet Take 1 tablet by mouth every 8 (eight) hours as needed.  . insulin glargine (LANTUS) 100 UNIT/ML injection Inject under skin 70 units at bedtime.  Marland Kitchen lisinopril-hydrochlorothiazide (PRINZIDE,ZESTORETIC) 20-12.5 MG per tablet TAKE ONE TABLET BY MOUTH TWICE DAILY  . metFORMIN (GLUCOPHAGE) 1000 MG tablet TAKE ONE TABLET BY MOUTH TWICE DAILY  . nitroGLYCERIN (NITROSTAT) 0.4 MG SL tablet Place 1 tablet (0.4 mg total) under the tongue every 5 (five) minutes as needed for chest pain.  . pantoprazole (PROTONIX) 40 MG  tablet Take 1 tablet (40 mg total) by mouth 2 (two) times daily.  . pregabalin (LYRICA) 150 MG capsule Take 1 capsule (150 mg total) by mouth 2 (two) times daily.  . rosuvastatin (CRESTOR) 10 MG tablet Take 10 mg by mouth daily.  . tamsulosin (FLOMAX) 0.4 MG CAPS capsule Take 1 capsule (0.4 mg total) by mouth daily after breakfast.  . tiZANidine (ZANAFLEX) 4 MG tablet Take 1 tablet (4 mg total) by mouth every 8 (eight) hours as needed (pain).  . insulin aspart (NOVOLOG FLEXPEN) 100 UNIT/ML FlexPen Inject 46 Units into the skin 3 (three) times daily with meals.     Review of Systems  Constitutional: Negative for fever, chills, activity change, appetite change, fatigue and unexpected weight change.  Eyes: Negative for visual disturbance.  Respiratory: Negative for cough and shortness of breath.   Cardiovascular: Negative for chest pain, palpitations and leg swelling.  Gastrointestinal: Negative for abdominal pain and abdominal distention.  Genitourinary: Negative for dysuria, urgency and difficulty urinating.  Musculoskeletal: Positive for arthralgias, gait problem and myalgias.  Skin: Positive for wound. Negative for color change and rash.  Neurological: Positive for weakness and numbness.  Hematological: Negative for adenopathy.  Psychiatric/Behavioral: Negative for sleep disturbance and dysphoric mood. The patient is not nervous/anxious.        Objective:    BP 134/88  Pulse 58  Temp(Src) 98.2 F (36.8 C) (Oral)  Ht 6' (1.829 m)  Wt 250 lb (113.399 kg)  BMI 33.90 kg/m2  SpO2 97% Physical Exam  Constitutional:  He is oriented to person, place, and time. He appears well-developed and well-nourished. No distress.  HENT:  Head: Normocephalic and atraumatic.  Right Ear: External ear normal.  Left Ear: External ear normal.  Nose: Nose normal.  Mouth/Throat: Oropharynx is clear and moist. No oropharyngeal exudate.  Eyes: Conjunctivae and EOM are normal. Pupils are equal, round, and  reactive to light. Right eye exhibits no discharge. Left eye exhibits no discharge. No scleral icterus.  Neck: Normal range of motion. Neck supple. No tracheal deviation present. No thyromegaly present.  Cardiovascular: Normal rate, regular rhythm and normal heart sounds.  Exam reveals no gallop and no friction rub.   No murmur heard. Pulmonary/Chest: Effort normal and breath sounds normal. No accessory muscle usage. Not tachypneic. No respiratory distress. He has no decreased breath sounds. He has no wheezes. He has no rhonchi. He has no rales. He exhibits no tenderness.  Musculoskeletal: Normal range of motion. He exhibits no edema.  Lymphadenopathy:    He has no cervical adenopathy.  Neurological: He is alert and oriented to person, place, and time. A sensory deficit is present. No cranial nerve deficit. Gait abnormal. Coordination normal.  4/5 grip strength right hand. Marked atrophy of right tricep and bicep with 4/5 flexion and extension of right forearm. Shoulder abduction limited by pain.  Skin: Skin is warm and dry. No rash noted. He is not diaphoretic. No erythema. No pallor.     Psychiatric: He has a normal mood and affect. His behavior is normal. Judgment and thought content normal.          Assessment & Plan:   Problem List Items Addressed This Visit     High   DM (diabetes mellitus), type 2 with neurological complications - Primary     Will check A1c with labs today. Continue current medications. Follow up with Dr. Cruzita Lederer next week as scheduled.    Relevant Medications      insulin aspart (NOVOLOG FLEXPEN) 100 UNIT/ML FlexPen   Other Relevant Orders      Comprehensive metabolic panel      Hemoglobin A1c      Lipid panel      Microalbumin / creatinine urine ratio   Hypertension      BP Readings from Last 3 Encounters:  05/14/14 134/88  04/13/14 126/84  02/09/14 142/80   BP well controlled on current medications. Will continue.      Unprioritized   B12  deficiency   Relevant Medications      cyanocobalamin ((VITAMIN B-12)) injection 1,000 mcg (Completed)   Cervical disc disorder with radiculopathy of cervical region     Progressive right arm pain and weakness c/w known h/p cervical radiculopathy. Will set up follow up with Dr. Hal Neer in [place]. Continue prn Hydrocodone for severe pain only.    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 Vickki Muff for foot ulceration. Will request recent notes.    Relevant Medications      insulin aspart (NOVOLOG FLEXPEN) 100 UNIT/ML FlexPen   Headache(784.0)   Relevant Medications      tiZANidine (ZANAFLEX) tablet      HYDROcodone-acetaminophen (NORCO/VICODIN) 5-325 MG per tablet   Hyperlipidemia     Will check lipids and LFTs with labs. Continue Crestor.     Other Visit Diagnoses   Need for prophylactic vaccination against Streptococcus pneumoniae (pneumococcus)  Relevant Orders       Pneumococcal conjugate vaccine 13-valent (Completed)        Return in about 3 months (around 08/14/2014).

## 2014-05-15 LAB — LIPID PANEL
CHOLESTEROL: 126 mg/dL (ref 0–200)
HDL: 34 mg/dL — ABNORMAL LOW (ref >39)
LDL Cholesterol: 25 mg/dL (ref 0–99)
Total CHOL/HDL Ratio: 3.7 Ratio
Triglycerides: 337 mg/dL — ABNORMAL HIGH (ref <150)
VLDL: 67 mg/dL — AB (ref 0–40)

## 2014-05-15 LAB — MICROALBUMIN / CREATININE URINE RATIO
CREATININE, URINE: 97.3 mg/dL
MICROALB/CREAT RATIO: 2585.5 mg/g — AB (ref 0.0–30.0)
Microalb, Ur: 251.57 mg/dL — ABNORMAL HIGH (ref 0.00–1.89)

## 2014-05-15 LAB — COMPREHENSIVE METABOLIC PANEL
ALT: 11 U/L (ref 0–53)
AST: 21 U/L (ref 0–37)
Albumin: 3.7 g/dL (ref 3.5–5.2)
Alkaline Phosphatase: 64 U/L (ref 39–117)
BUN: 11 mg/dL (ref 6–23)
CALCIUM: 8.9 mg/dL (ref 8.4–10.5)
CO2: 21 mEq/L (ref 19–32)
CREATININE: 0.89 mg/dL (ref 0.50–1.35)
Chloride: 102 mEq/L (ref 96–112)
Glucose, Bld: 130 mg/dL — ABNORMAL HIGH (ref 70–99)
Potassium: 3.9 mEq/L (ref 3.5–5.3)
Sodium: 139 mEq/L (ref 135–145)
Total Bilirubin: 0.4 mg/dL (ref 0.2–1.2)
Total Protein: 6 g/dL (ref 6.0–8.3)

## 2014-05-17 ENCOUNTER — Encounter: Payer: Self-pay | Admitting: Internal Medicine

## 2014-05-17 DIAGNOSIS — B079 Viral wart, unspecified: Secondary | ICD-10-CM | POA: Diagnosis not present

## 2014-05-17 DIAGNOSIS — L821 Other seborrheic keratosis: Secondary | ICD-10-CM | POA: Diagnosis not present

## 2014-05-17 DIAGNOSIS — C44621 Squamous cell carcinoma of skin of unspecified upper limb, including shoulder: Secondary | ICD-10-CM | POA: Diagnosis not present

## 2014-05-17 DIAGNOSIS — D485 Neoplasm of uncertain behavior of skin: Secondary | ICD-10-CM | POA: Diagnosis not present

## 2014-05-17 DIAGNOSIS — L570 Actinic keratosis: Secondary | ICD-10-CM | POA: Diagnosis not present

## 2014-05-18 ENCOUNTER — Encounter: Payer: Self-pay | Admitting: Internal Medicine

## 2014-05-18 ENCOUNTER — Ambulatory Visit (INDEPENDENT_AMBULATORY_CARE_PROVIDER_SITE_OTHER): Payer: Medicare Other | Admitting: Internal Medicine

## 2014-05-18 VITALS — BP 138/72 | HR 71 | Temp 99.0°F | Resp 12 | Wt 254.0 lb

## 2014-05-18 DIAGNOSIS — E1149 Type 2 diabetes mellitus with other diabetic neurological complication: Secondary | ICD-10-CM

## 2014-05-18 DIAGNOSIS — Z0279 Encounter for issue of other medical certificate: Secondary | ICD-10-CM

## 2014-05-18 NOTE — Progress Notes (Signed)
Patient ID: Steve Phillips, male   DOB: 1946-07-05, 68 y.o.   MRN: [id number]  HPI: Steve Phillips is a 68 y.o.-year-old male, returning for f/u for DM2, dx 1990's, insulin-dependent, uncontrolled, with complications (CAD, PN, CKD). Last visit 1 mo ago.  Pt brings a detailed foot exam form for obtaining DM footwear - to be filled out during the appt. He has a foot Dr but apparently the form needs to be filled out by his endocrinologist and not the foot dr (?).  He was also in the [facility] Study for 8 years. Last hemoglobin A1c was: Lab Results  Component Value Date   HGBA1C 10.1* 05/14/2014   HGBA1C 10.7* 01/27/2014   HGBA1C 10.4* 11/24/2013   Pt is now on: Metformin 1000 mg 2x a day.  Lantus 70 units at bedtime.  NovoLog as follows:  - 25 >> 30 >> 35 units for a smaller meal  - 30 >> 35 >> 38 units for a regular meal  - 33 >> 38 >> 40 units for a large meal  He sometimes takes 46 units. NovoLog Sliding Scale:  - 150- 165: + 1 unit  - 166- 180: + 2 units  - 181- 195: + 3 units  - 196- 210: + 4 units  - 211- 225: + 5 units  - > 225: + 6 units   Pt checks his sugars 2-3x a day and they are - per his log: - am: 54 this am - ? (190-300) >> 94-213 >> 102-286 >> 95-191 - 2h after b'fast: n/c >> 191, 266, 340, 356 >> 220-312 >> 191-301 - before lunch: 200-350 >> 179, 197 >> 200-231 >> 211-227 - 2h after lunch: n/c  >> 262 >> 302-403 >> 271, 287 - before dinner: 200-350 >> 129, 156, 214, 216 >> (71) 102-293 >> 161 - 2h after dinner: n/c >> 234-375, most low 200s >> 229-388 >> 197-337 - bedtime: high 200s >> 234, 266, 275 >> 256-473 >> 200-301 No lows; he has hypoglycemia awareness at 68.  Highest sugar was 473s.  Pt's meals are: - Breakfast: 2 eggs, sausage, toast, coffee - Lunch: sandwich or leftovers - Dinner: meat + veggies + bread, tea or milk - Snacks: pm and late at night  - no CKD, last BUN/creatinine:  Lab Results  Component Value Date   BUN 11 05/14/2014   CREATININE 0.89  05/14/2014  On Lisinopril. He has  - last set of lipids: Lab Results  Component Value Date   CHOL 126 05/14/2014   HDL 34* 05/14/2014   LDLCALC 25 05/14/2014   LDLDIRECT 46.0 02/23/2013   TRIG 337* 05/14/2014   CHOLHDL 3.7 05/14/2014  On crestor. - last eye exam was 01/28/2014 ([name]). No DR.  - + no sensation in legs below knee. He sees his podiatrist q 3 mo ([name]).  I reviewed pt's medications, allergies, PMH, social hx, family hx and no changes required, except as mentioned above.  ROS: Constitutional: no weight gain/loss, no fatigue, + increased appetite, no subjective hypothermia/hyper Eyes: no blurry vision, no xerophthalmia ENT: no sore throat, no nodules palpated in throat, no dysphagia/odynophagia, no hoarseness, + hypoacusis Cardiovascular: no CP/no SOB/palpitations/leg swelling Respiratory: no cough/SOB Gastrointestinal: no N/V/D/C Musculoskeletal: no muscle aches/no joint aches Skin: no rashes Neurological: no tremors/+ numbness - up to knees/tingling/dizziness L PE: BP 138/72  Pulse 71  Temp(Src) 99 F (37.2 C) (Oral)  Resp 12  Wt 254 lb (115.214 kg)  SpO2 97% Wt  Readings from Last 3 Encounters:  05/18/14 254 lb (115.214 kg)  05/14/14 250 lb (113.399 kg)  04/13/14 248 lb (112.492 kg)   Constitutional: overweight, in NAD Eyes: PERRLA, EOMI, no exophthalmos ENT: moist mucous membranes, no thyromegaly, no cervical lymphadenopathy Cardiovascular: RRR, No MRG Respiratory: CTA B Gastrointestinal: abdomen soft, NT, ND, BS+ Musculoskeletal: no deformities, strength intact in all 4 Skin: moist, warm, no rashes  Foot exam performed today  - detailed - will scan form  ASSESSMENT: 1. DM2, insulin-dependent, uncontrolled, with complications - CAD, s/p multiple PCI with stent RCA,LAD and obtuse marginal, followed @ [facility] on the accord study.., cath 02/2012 90% D1, s/p drug-eluting stent - Dr. Fletcher [name] - Peripheral neuropathy - CKD, mild  PLAN:  1.  Patient with long-standing, uncontrolled, diabetes, on a large dose of basal insulin + mealtime insulin + metformin, with still poor control.  - we had again a log discussion about the absolute necessity to improve diet. - We discussed about options for treatment, and I suggested to:  Patient Instructions   Continue Metformin 1000 mg 2x a day.   Continue Lantus 70 units at bedtime.   Increase NovoLog as follows:  - 25 >> 30 >> 35 >> 40 units for a smaller meal  - 30 >> 35 >> 38 >> 43 units for a regular meal  - 33 >> 38 >> 40 >> 45 units for a large meal   Continue NovoLog Sliding Scale:  - 150- 165: + 1 unit  - 166- 180: + 2 units  - 181- 195: + 3 units  - 196- 210: + 4 units  - 211- 225: + 5 units  - > 225: + 6 units   Please return in 1.5 months.     - given new sugar logs - continue checking sugars at different times of the day - check 3 times a day, rotating checks - up to date with eye exams  - time spent with the patient: 40 min, of which >50% was spent in obtaining information to fill out his detailed foot exam form (will scan), reviewing his previous labs and reviewing his sugar log, counseling him about his condition (mostly in nutrition), and developing a plan to optimize his sugars.

## 2014-05-18 NOTE — Patient Instructions (Addendum)
Continue Metformin 1000 mg 2x a day.   Continue Lantus 70 units at bedtime.   Increase NovoLog as follows:  - 25 >> 30 >> 35 >> 40 units for a smaller meal  - 30 >> 35 >> 38 >> 43 units for a regular meal  - 33 >> 38 >> 40 >> 45 units for a large meal   Continue NovoLog Sliding Scale:  - 150- 165: + 1 unit  - 166- 180: + 2 units  - 181- 195: + 3 units  - 196- 210: + 4 units  - 211- 225: + 5 units  - > 225: + 6 units   Please return in 1.5 months.

## 2014-05-19 ENCOUNTER — Encounter: Payer: Self-pay | Admitting: Internal Medicine

## 2014-06-01 ENCOUNTER — Encounter: Payer: Self-pay | Admitting: Internal Medicine

## 2014-06-17 ENCOUNTER — Encounter: Payer: Self-pay | Admitting: Internal Medicine

## 2014-06-17 ENCOUNTER — Ambulatory Visit: Payer: Self-pay

## 2014-06-17 DIAGNOSIS — I6350 Cerebral infarction due to unspecified occlusion or stenosis of unspecified cerebral artery: Secondary | ICD-10-CM | POA: Diagnosis not present

## 2014-06-17 DIAGNOSIS — I10 Essential (primary) hypertension: Secondary | ICD-10-CM | POA: Diagnosis not present

## 2014-06-17 DIAGNOSIS — Z7982 Long term (current) use of aspirin: Secondary | ICD-10-CM | POA: Diagnosis not present

## 2014-06-17 DIAGNOSIS — Z79899 Other long term (current) drug therapy: Secondary | ICD-10-CM | POA: Diagnosis not present

## 2014-06-17 DIAGNOSIS — E7800 Pure hypercholesterolemia, unspecified: Secondary | ICD-10-CM | POA: Diagnosis not present

## 2014-06-17 DIAGNOSIS — R29810 Facial weakness: Secondary | ICD-10-CM | POA: Diagnosis not present

## 2014-06-17 DIAGNOSIS — G20 Parkinson's disease: Secondary | ICD-10-CM | POA: Diagnosis not present

## 2014-06-17 DIAGNOSIS — E119 Type 2 diabetes mellitus without complications: Secondary | ICD-10-CM | POA: Diagnosis not present

## 2014-06-17 DIAGNOSIS — K219 Gastro-esophageal reflux disease without esophagitis: Secondary | ICD-10-CM | POA: Diagnosis not present

## 2014-06-17 DIAGNOSIS — R9431 Abnormal electrocardiogram [ECG] [EKG]: Secondary | ICD-10-CM | POA: Diagnosis not present

## 2014-06-17 LAB — COMPREHENSIVE METABOLIC PANEL
Albumin: 2.9 g/dL — ABNORMAL LOW (ref 3.4–5.0)
Alkaline Phosphatase: 65 U/L
Anion Gap: 9 (ref 7–16)
BILIRUBIN TOTAL: 0.5 mg/dL (ref 0.2–1.0)
BUN: 9 mg/dL (ref 7–18)
CALCIUM: 8.7 mg/dL (ref 8.5–10.1)
CO2: 26 mmol/L (ref 21–32)
CREATININE: 1.55 mg/dL — AB (ref 0.60–1.30)
Chloride: 107 mmol/L (ref 98–107)
EGFR (African American): 52 — ABNORMAL LOW
EGFR (Non-African Amer.): 45 — ABNORMAL LOW
GLUCOSE: 69 mg/dL (ref 65–99)
Osmolality: 280 (ref 275–301)
POTASSIUM: 3.3 mmol/L — AB (ref 3.5–5.1)
SGOT(AST): 22 U/L (ref 15–37)
SGPT (ALT): 21 U/L
Sodium: 142 mmol/L (ref 136–145)
TOTAL PROTEIN: 6.4 g/dL (ref 6.4–8.2)

## 2014-06-17 LAB — CBC WITH DIFFERENTIAL/PLATELET
BASOS PCT: 0.9 %
Basophil #: 0.1 10*3/uL (ref 0.0–0.1)
Eosinophil #: 0.4 10*3/uL (ref 0.0–0.7)
Eosinophil %: 3.5 %
HCT: 44.5 % (ref 40.0–52.0)
HGB: 13.8 g/dL (ref 13.0–18.0)
LYMPHS ABS: 2 10*3/uL (ref 1.0–3.6)
Lymphocyte %: 16.7 %
MCH: 23.1 pg — ABNORMAL LOW (ref 26.0–34.0)
MCHC: 31.1 g/dL — AB (ref 32.0–36.0)
MCV: 74 fL — ABNORMAL LOW (ref 80–100)
Monocyte #: 1 x10 3/mm (ref 0.2–1.0)
Monocyte %: 8.1 %
Neutrophil #: 8.5 10*3/uL — ABNORMAL HIGH (ref 1.4–6.5)
Neutrophil %: 70.8 %
PLATELETS: 228 10*3/uL (ref 150–440)
RBC: 5.98 10*6/uL — ABNORMAL HIGH (ref 4.40–5.90)
RDW: 17.6 % — ABNORMAL HIGH (ref 11.5–14.5)
WBC: 12.1 10*3/uL — ABNORMAL HIGH (ref 3.8–10.6)

## 2014-06-17 LAB — TROPONIN I: Troponin-I: <0.02 ng/mL

## 2014-06-18 ENCOUNTER — Inpatient Hospital Stay: Payer: Self-pay | Admitting: Internal Medicine

## 2014-06-18 DIAGNOSIS — I69992 Facial weakness following unspecified cerebrovascular disease: Secondary | ICD-10-CM | POA: Diagnosis not present

## 2014-06-18 DIAGNOSIS — E119 Type 2 diabetes mellitus without complications: Secondary | ICD-10-CM | POA: Diagnosis not present

## 2014-06-18 DIAGNOSIS — R4789 Other speech disturbances: Secondary | ICD-10-CM | POA: Diagnosis present

## 2014-06-18 DIAGNOSIS — E1149 Type 2 diabetes mellitus with other diabetic neurological complication: Secondary | ICD-10-CM | POA: Diagnosis present

## 2014-06-18 DIAGNOSIS — I10 Essential (primary) hypertension: Secondary | ICD-10-CM | POA: Diagnosis not present

## 2014-06-18 DIAGNOSIS — Z885 Allergy status to narcotic agent status: Secondary | ICD-10-CM | POA: Diagnosis not present

## 2014-06-18 DIAGNOSIS — G20 Parkinson's disease: Secondary | ICD-10-CM | POA: Diagnosis present

## 2014-06-18 DIAGNOSIS — Z7902 Long term (current) use of antithrombotics/antiplatelets: Secondary | ICD-10-CM | POA: Diagnosis not present

## 2014-06-18 DIAGNOSIS — I517 Cardiomegaly: Secondary | ICD-10-CM | POA: Diagnosis not present

## 2014-06-18 DIAGNOSIS — E785 Hyperlipidemia, unspecified: Secondary | ICD-10-CM | POA: Diagnosis present

## 2014-06-18 DIAGNOSIS — G510 Bell's palsy: Secondary | ICD-10-CM | POA: Diagnosis present

## 2014-06-18 DIAGNOSIS — R29898 Other symptoms and signs involving the musculoskeletal system: Secondary | ICD-10-CM | POA: Diagnosis present

## 2014-06-18 DIAGNOSIS — G459 Transient cerebral ischemic attack, unspecified: Secondary | ICD-10-CM | POA: Diagnosis not present

## 2014-06-18 DIAGNOSIS — E876 Hypokalemia: Secondary | ICD-10-CM | POA: Diagnosis not present

## 2014-06-18 DIAGNOSIS — R5381 Other malaise: Secondary | ICD-10-CM | POA: Diagnosis present

## 2014-06-18 DIAGNOSIS — I6350 Cerebral infarction due to unspecified occlusion or stenosis of unspecified cerebral artery: Secondary | ICD-10-CM | POA: Diagnosis present

## 2014-06-18 DIAGNOSIS — I658 Occlusion and stenosis of other precerebral arteries: Secondary | ICD-10-CM | POA: Diagnosis not present

## 2014-06-18 DIAGNOSIS — M5412 Radiculopathy, cervical region: Secondary | ICD-10-CM | POA: Diagnosis present

## 2014-06-18 DIAGNOSIS — K219 Gastro-esophageal reflux disease without esophagitis: Secondary | ICD-10-CM | POA: Diagnosis present

## 2014-06-18 DIAGNOSIS — Z794 Long term (current) use of insulin: Secondary | ICD-10-CM | POA: Diagnosis not present

## 2014-06-18 DIAGNOSIS — Z7982 Long term (current) use of aspirin: Secondary | ICD-10-CM | POA: Diagnosis not present

## 2014-06-18 DIAGNOSIS — E875 Hyperkalemia: Secondary | ICD-10-CM | POA: Diagnosis present

## 2014-06-18 DIAGNOSIS — E1142 Type 2 diabetes mellitus with diabetic polyneuropathy: Secondary | ICD-10-CM | POA: Diagnosis present

## 2014-06-18 LAB — BASIC METABOLIC PANEL
ANION GAP: 9 (ref 7–16)
BUN: 11 mg/dL (ref 7–18)
CHLORIDE: 106 mmol/L (ref 98–107)
CO2: 29 mmol/L (ref 21–32)
Calcium, Total: 8.5 mg/dL (ref 8.5–10.1)
Creatinine: 1.19 mg/dL (ref 0.60–1.30)
EGFR (African American): >60
Glucose: 80 mg/dL (ref 65–99)
Osmolality: 285 (ref 275–301)
Potassium: 2.8 mmol/L — ABNORMAL LOW (ref 3.5–5.1)
Sodium: 144 mmol/L (ref 136–145)

## 2014-06-18 LAB — CBC WITH DIFFERENTIAL/PLATELET
BASOS ABS: 0.1 10*3/uL (ref 0.0–0.1)
Basophil %: 0.8 %
EOS ABS: 0.5 10*3/uL (ref 0.0–0.7)
EOS PCT: 4.1 %
HCT: 41 % (ref 40.0–52.0)
HGB: 13.4 g/dL (ref 13.0–18.0)
LYMPHS ABS: 2.3 10*3/uL (ref 1.0–3.6)
Lymphocyte %: 19.6 %
MCH: 23.7 pg — AB (ref 26.0–34.0)
MCHC: 32.7 g/dL (ref 32.0–36.0)
MCV: 72 fL — AB (ref 80–100)
MONOS PCT: 8.7 %
Monocyte #: 1 x10 3/mm (ref 0.2–1.0)
NEUTROS ABS: 8 10*3/uL — AB (ref 1.4–6.5)
Neutrophil %: 66.8 %
PLATELETS: 211 10*3/uL (ref 150–440)
RBC: 5.66 10*6/uL (ref 4.40–5.90)
RDW: 17.4 % — ABNORMAL HIGH (ref 11.5–14.5)
WBC: 11.9 10*3/uL — AB (ref 3.8–10.6)

## 2014-06-18 MED ORDER — DULOXETINE HCL 60 MG PO CPEP: ORAL_CAPSULE | ORAL | Status: DC

## 2014-06-18 MED ORDER — METFORMIN HCL 1000 MG PO TABS: ORAL_TABLET | ORAL | Status: DC

## 2014-06-18 MED ORDER — CLOPIDOGREL BISULFATE 75 MG PO TABS: 75.0000 mg | ORAL_TABLET | Freq: Every day | ORAL | Status: DC

## 2014-06-19 DIAGNOSIS — E119 Type 2 diabetes mellitus without complications: Secondary | ICD-10-CM | POA: Diagnosis not present

## 2014-06-19 DIAGNOSIS — I6350 Cerebral infarction due to unspecified occlusion or stenosis of unspecified cerebral artery: Secondary | ICD-10-CM | POA: Diagnosis not present

## 2014-06-19 DIAGNOSIS — E876 Hypokalemia: Secondary | ICD-10-CM | POA: Diagnosis not present

## 2014-06-19 DIAGNOSIS — I10 Essential (primary) hypertension: Secondary | ICD-10-CM | POA: Diagnosis not present

## 2014-06-19 LAB — BASIC METABOLIC PANEL
ANION GAP: 9 (ref 7–16)
BUN: 14 mg/dL (ref 7–18)
Calcium, Total: 8.4 mg/dL — ABNORMAL LOW (ref 8.5–10.1)
Chloride: 107 mmol/L (ref 98–107)
Co2: 23 mmol/L (ref 21–32)
Creatinine: 1.11 mg/dL (ref 0.60–1.30)
EGFR (African American): >60
Glucose: 320 mg/dL — ABNORMAL HIGH (ref 65–99)
Osmolality: 290 (ref 275–301)
Potassium: 3.9 mmol/L (ref 3.5–5.1)
Sodium: 139 mmol/L (ref 136–145)

## 2014-06-19 LAB — LIPID PANEL
Cholesterol: 106 mg/dL (ref 0–200)
HDL Cholesterol: 29 mg/dL — ABNORMAL LOW (ref 40–60)
LDL CHOLESTEROL, CALC: 49 mg/dL (ref 0–100)
Triglycerides: 142 mg/dL (ref 0–200)
VLDL Cholesterol, Calc: 28 mg/dL (ref 5–40)

## 2014-06-24 ENCOUNTER — Encounter: Payer: Self-pay | Admitting: Internal Medicine

## 2014-06-25 ENCOUNTER — Telehealth: Payer: Self-pay | Admitting: Internal Medicine

## 2014-06-25 NOTE — Telephone Encounter (Signed)
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 Please advise where to add pt/msn

## 2014-06-25 NOTE — Telephone Encounter (Signed)
Spoke with pt's wife, see telephone note. Appt sch 06/28/14

## 2014-06-25 NOTE — Telephone Encounter (Signed)
Sent for records

## 2014-06-25 NOTE — Telephone Encounter (Signed)
Spoke with Ms. Skoda, states pt is doing fair. No acute problems at this time. States he has appt with Dr. Manuella Ghazi in September. Appt scheduled with Dr. Gilford Rile 06/28/14

## 2014-06-28 ENCOUNTER — Encounter: Payer: Self-pay | Admitting: Cardiovascular Disease

## 2014-06-28 ENCOUNTER — Ambulatory Visit (INDEPENDENT_AMBULATORY_CARE_PROVIDER_SITE_OTHER): Payer: Medicare Other | Admitting: Cardiovascular Disease

## 2014-06-28 ENCOUNTER — Ambulatory Visit (INDEPENDENT_AMBULATORY_CARE_PROVIDER_SITE_OTHER): Payer: Medicare Other | Admitting: Internal Medicine

## 2014-06-28 ENCOUNTER — Encounter: Payer: Self-pay | Admitting: Internal Medicine

## 2014-06-28 VITALS — BP 144/78 | HR 67 | Temp 98.2°F | Ht 72.0 in | Wt 250.2 lb

## 2014-06-28 VITALS — BP 160/78 | HR 65 | Ht 72.0 in | Wt 249.8 lb

## 2014-06-28 DIAGNOSIS — E1149 Type 2 diabetes mellitus with other diabetic neurological complication: Secondary | ICD-10-CM

## 2014-06-28 DIAGNOSIS — I6340 Cerebral infarction due to embolism of unspecified cerebral artery: Secondary | ICD-10-CM

## 2014-06-28 DIAGNOSIS — IMO0002 Reserved for concepts with insufficient information to code with codable children: Secondary | ICD-10-CM | POA: Insufficient documentation

## 2014-06-28 DIAGNOSIS — I10 Essential (primary) hypertension: Secondary | ICD-10-CM | POA: Diagnosis not present

## 2014-06-28 DIAGNOSIS — I2510 Atherosclerotic heart disease of native coronary artery without angina pectoris: Secondary | ICD-10-CM

## 2014-06-28 DIAGNOSIS — G510 Bell's palsy: Secondary | ICD-10-CM | POA: Diagnosis not present

## 2014-06-28 DIAGNOSIS — I209 Angina pectoris, unspecified: Secondary | ICD-10-CM | POA: Diagnosis not present

## 2014-06-28 DIAGNOSIS — I6350 Cerebral infarction due to unspecified occlusion or stenosis of unspecified cerebral artery: Secondary | ICD-10-CM | POA: Diagnosis not present

## 2014-06-28 DIAGNOSIS — I25119 Atherosclerotic heart disease of native coronary artery with unspecified angina pectoris: Secondary | ICD-10-CM

## 2014-06-28 DIAGNOSIS — E785 Hyperlipidemia, unspecified: Secondary | ICD-10-CM | POA: Diagnosis not present

## 2014-06-28 DIAGNOSIS — I639 Cerebral infarction, unspecified: Secondary | ICD-10-CM

## 2014-06-28 MED ORDER — AMLODIPINE BESYLATE 5 MG PO TABS: 5.0000 mg | ORAL_TABLET | Freq: Every day | ORAL | Status: DC

## 2014-06-28 MED ORDER — DULOXETINE HCL 60 MG PO CPEP: ORAL_CAPSULE | ORAL | Status: DC

## 2014-06-28 NOTE — Patient Instructions (Signed)
We will set up earlier follow up with Dr. Manuella Ghazi.  Continue current medications.  Labs prior to next visit.

## 2014-06-28 NOTE — Assessment & Plan Note (Signed)
Lab Results  Component Value Date   CHOL 126 05/14/2014   HDL 34* 05/14/2014   LDLCALC 25 05/14/2014   LDLDIRECT 46.0 02/23/2013   TRIG 337* 05/14/2014   CHOLHDL 3.7 05/14/2014   Continue treatment with Crestor.

## 2014-06-28 NOTE — Assessment & Plan Note (Signed)
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 I agree that we should evaluate for paroxysmal atrial fibrillation. I requested a 30 day outpatient telemetry.

## 2014-06-28 NOTE — Progress Notes (Signed)
Subjective:    Patient ID: Steve Phillips, male    DOB: [birth year], 68 y.o.   MRN: [id number]  HPI 68YO male presents for hospital follow up.  Admitted 7/31 Discharge 8/1 Woke 7/31 with headache and left facial droop, as well as left arm weakness. Went to the ED for evaluation. Diagnosed with right CVA and Bell's Palsy. Treated with Prednisone and Valtrex with improvement in left facial droop.  MRI brain showed non-hemmorhagic 57mm infarct of right frontal lobe.  Strength in left hand improved since discharge. Sensation on left side of face also improved. Continues to drink out of a straw.  DM - BG higher on prednisone, with one reading >400. Compliant with medications. No low BG<70.     Review of Systems  Constitutional: Negative for fever, chills, activity change, appetite change, fatigue and unexpected weight change.  HENT: Negative for trouble swallowing and voice change.   Eyes: Negative for visual disturbance.  Respiratory: Negative for cough and shortness of breath.   Cardiovascular: Negative for chest pain, palpitations and leg swelling.  Gastrointestinal: Negative for abdominal pain and abdominal distention.  Genitourinary: Negative for dysuria, urgency and difficulty urinating.  Musculoskeletal: Positive for arthralgias, gait problem and myalgias.  Skin: Negative for color change and rash.  Neurological: Positive for facial asymmetry, speech difficulty, weakness, light-headedness, numbness and headaches.  Hematological: Negative for adenopathy.  Psychiatric/Behavioral: Negative for sleep disturbance and dysphoric mood. The patient is not nervous/anxious.        Objective:    BP 144/78  Pulse 67  Temp(Src) 98.2 F (36.8 C) (Oral)  Ht 6' (1.829 m)  Wt 250 lb 4 oz (113.513 kg)  BMI 33.93 kg/m2  SpO2 95% Physical Exam  Constitutional: He is oriented to person, place, and time. He appears well-developed and well-nourished. No distress.  HENT:  Head: Normocephalic  and atraumatic.  Right Ear: External ear normal.  Left Ear: External ear normal.  Nose: Nose normal.  Mouth/Throat: Oropharynx is clear and moist. No oropharyngeal exudate.  Eyes: Conjunctivae and EOM are normal. Pupils are equal, round, and reactive to light. Right eye exhibits no discharge. Left eye exhibits no discharge. No scleral icterus.  Neck: Normal range of motion. Neck supple. No tracheal deviation present. No thyromegaly present.  Cardiovascular: Normal rate, regular rhythm and normal heart sounds.  Exam reveals no gallop and no friction rub.   No murmur heard. Pulmonary/Chest: Effort normal and breath sounds normal. No accessory muscle usage. Not tachypneic. No respiratory distress. He has no decreased breath sounds. He has no wheezes. He has no rhonchi. He has no rales. He exhibits no tenderness.  Musculoskeletal: Normal range of motion. He exhibits no edema.  Lymphadenopathy:    He has no cervical adenopathy.  Neurological: He is alert and oriented to person, place, and time. A sensory deficit is present. No cranial nerve deficit. He exhibits abnormal muscle tone. Gait (shuffling) abnormal. Coordination normal.  Left sided facial droop, strength 4/5 grip bilateral UE  Skin: Skin is warm and dry. No rash noted. He is not diaphoretic. No erythema. No pallor.  Psychiatric: He has a normal mood and affect. His behavior is normal. Judgment and thought content normal.          Assessment & Plan:   Problem List Items Addressed This Visit     High   DM (diabetes mellitus), type 2 with neurological complications      Lab Results  Component Value Date   HGBA1C 10.1* 05/14/2014  Poorly controlled. Seemed to be improving with recent changes in insulin regimen, however exacerbated by use of Prednisone last couple of weeks. Will set up earlier follow up with neurology.    Relevant Medications      insulin aspart (NOVOLOG) 100 UNIT/ML FlexPen   Hypertension      BP Readings  from Last 3 Encounters:  06/28/14 144/78  05/18/14 138/72  05/14/14 134/88   BP fairly well controlled. Amlodipine recently added while hospitalized. Will continue. Recent renal function normal.    Relevant Medications      amLODIpine (NORVASC) tablet     Unprioritized   Bell's palsy     Completed prednisone taper. Questions if he should take additional course. Will defer to neurology. Has very poorly controlled DM, so would prefer to keep off steroids if possible.    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 Will set up follow up with neurology. Continue current medications. Reviewed hospital notes. ECHO and carotid doppler were normal. Question raised of whether 60-day holter might be helpful to pick up arrythmia leading to embolic event. NSR today. Will defer to cardiology.    Relevant Medications      amLODIpine (NORVASC) tablet       Return in about 4 weeks (around 07/26/2014) for Recheck.

## 2014-06-28 NOTE — Progress Notes (Signed)
Pre visit review using our clinic review tool, if applicable. No additional management support is needed unless otherwise documented below in the visit note. 

## 2014-06-28 NOTE — Assessment & Plan Note (Signed)
Completed prednisone taper. Questions if he should take additional course. Will defer to neurology. Has very poorly controlled DM, so would prefer to keep off steroids if possible.

## 2014-06-28 NOTE — Progress Notes (Signed)
HPI  This is a 68 year old male who is here today for followup visit. He has not been seen by me in over a year. He has known history of coronary artery disease status post multiple PCI in the past at [facility]. He presented in April of 2013 with chest pain. He underwent cardiac catheterization which showed patent stents. However, there was a 90% stenosis in the proximal first diagonal. There was 40% disease in the mid LAD and moderate disease in the left circumflex. He underwent an angioplasty and drug-eluting stent placement to the diagonal without complications.  he has known history of refractory hypertension. Renal artery duplex ultrasound showed no evidence of renal artery stenosis. He presented recently to [facility] with left-sided weakness and facial droop. He was diagnosed with Bell's palsy and small right frontal infarct which was felt to be embolic. Carotid ultrasound showed mild nonobstructive disease. Echo showed normal LV systolic function with moderate left ventricular hypertrophy. He was started on amlodipine for blood pressure control. He is overall doing reasonably well. He denies chest pain or significant dyspnea.  Allergies  Allergen Reactions  . Codeine Nausea Only    Derivatives Nausea.  . Tramadol     unknown  . Trazodone And Nefazodone Nausea Only     Current Outpatient Prescriptions on File Prior to Visit  Medication Sig Dispense Refill  . amLODipine (NORVASC) 5 MG tablet Take 1 tablet (5 mg total) by mouth daily.  90 tablet  3  . aspirin 81 MG tablet Take 81 mg by mouth 3 (three) times daily.      . carbidopa-levodopa (SINEMET IR) 25-100 MG per tablet Take 1 tablet by mouth 3 (three) times daily.       . carvedilol (COREG) 25 MG tablet TAKE ONE TABLET BY MOUTH TWICE DAILY  180 tablet  1  . clopidogrel (PLAVIX) 75 MG tablet Take 1 tablet (75 mg total) by mouth daily with breakfast.  90 tablet  1  . cyanocobalamin (,VITAMIN B-12,) 1000 MCG/ML injection Inject  1,000 mcg into the muscle once.      . DULoxetine (CYMBALTA) 60 MG capsule TAKE 1 CAPSULE DAILY  90 capsule  3  . glucose blood (FREESTYLE TEST STRIPS) test strip Use to check blood sugar up to three times daily  300 each  1  . HYDROcodone-acetaminophen (NORCO/VICODIN) 5-325 MG per tablet Take 1 tablet by mouth every 8 (eight) hours as needed.  90 tablet  0  . insulin aspart (NOVOLOG) 100 UNIT/ML FlexPen Inject 51 Units into the skin 3 (three) times daily with meals.      . insulin glargine (LANTUS) 100 UNIT/ML injection Inject under skin 70 units at bedtime.  30 mL  6  . lisinopril-hydrochlorothiazide (PRINZIDE,ZESTORETIC) 20-12.5 MG per tablet TAKE ONE TABLET BY MOUTH TWICE DAILY  180 tablet  1  . metFORMIN (GLUCOPHAGE) 1000 MG tablet TAKE ONE TABLET BY MOUTH TWICE DAILY  180 tablet  1  . nitroGLYCERIN (NITROSTAT) 0.4 MG SL tablet Place 1 tablet (0.4 mg total) under the tongue every 5 (five) minutes as needed for chest pain.  30 tablet  6  . pantoprazole (PROTONIX) 40 MG tablet Take 1 tablet (40 mg total) by mouth 2 (two) times daily.  180 tablet  3  . PREDNISONE, PAK, PO Take by mouth.      . pregabalin (LYRICA) 150 MG capsule Take 1 capsule (150 mg total) by mouth 2 (two) times daily.  180 capsule  2  .  rosuvastatin (CRESTOR) 10 MG tablet Take 10 mg by mouth daily.      Marland Kitchen tiZANidine (ZANAFLEX) 4 MG tablet Take 1 tablet (4 mg total) by mouth every 8 (eight) hours as needed (pain).  63 tablet  1  . valACYclovir (VALTREX) 1000 MG tablet Take 1,000 mg by mouth 2 (two) times daily.       No current facility-administered medications on file prior to visit.     Past Medical History  Diagnosis Date  . Shingles   . Diabetes mellitus   . Hyperlipidemia   . Broken leg     left...s/p pins  . Lower leg pain     chronic ,left leg  . Acute angina   . Depression   . CAD (coronary artery disease)     s/p multiple PCI with stent RCA,LAD and obtuse marginal,followed @ [facility] on the accord study..,  cath 02/2012 90% D1, s/p drug-eluting stent Dr. Fletcher [name]  . Falls 09/2011  . Dizziness   . Shortness of breath   . Headache(784.0)   . Hypertension     dr Jerilynn Mages Audelia Acton     [facility] in [place]  . Stroke   . Bell's palsy   . Parkinson disease   . Neuropathy      Past Surgical History  Procedure Laterality Date  . Appendectomy    . Cholecystectomy    . Left leg surgery    . Left foot surgery    . Drainage port in left testicle    . Cardiac catheterization  02/2012  . Back surgery    . Anterior cervical decomp/discectomy fusion N/A 04/30/2013    Procedure: ANTERIOR CERVICAL DECOMPRESSION/DISCECTOMY FUSION 2 LEVELS;  Surgeon: Faythe Ghee, MD;  Location: [facility];  Service: Neurosurgery;  Laterality: N/A;  Cervical four-five, Cervical six-seven anterior cervical decompression fusion with trabecular metal cage and plate     Family History  Problem Relation Age of Onset  . Cancer Mother     BREAST  . Heart disease Mother     STENT  . Cancer Father     THROAT  . Heart disease Sister     STENT; HTN  . Heart attack Brother   . Hypertension Brother   . Hypertension Sister      History   Social History  . Marital Status: Married    Spouse Name: N/A    Number of Children: N/A  . Years of Education: N/A   Occupational History  . Not on file.   Social History Main Topics  . Smoking status: Former Smoker    Quit date: 11/08/1971  . Smokeless tobacco: Never Used  . Alcohol Use: No  . Drug Use: No  . Sexual Activity: Not on file   Other Topics Concern  . Not on file   Social History Narrative  . No narrative on file      PHYSICAL EXAM   BP 160/78  Pulse 65  Ht 6' (1.829 m)  Wt 249 lb 12 oz (113.286 kg)  BMI 33.86 kg/m2 Constitutional: He is oriented to person, place, and time. He appears well-developed and well-nourished. No distress.  HENT: No nasal discharge.  Head: Normocephalic and atraumatic.  Eyes: Pupils are equal and round. Right eye exhibits no  discharge. Left eye exhibits no discharge.  Neck: Normal range of motion. Neck supple. No JVD present. No thyromegaly present.  Cardiovascular: Normal rate, regular rhythm, normal heart sounds and. Exam reveals no gallop and no friction rub. No  murmur heard.  Pulmonary/Chest: Effort normal and breath sounds normal. No stridor. No respiratory distress. He has no wheezes. He has no rales. He exhibits no tenderness.  Abdominal: Soft. Bowel sounds are normal. He exhibits no distension. There is no tenderness. There is no rebound and no guarding.  Musculoskeletal: Normal range of motion. He exhibits no edema and no tenderness.  Neurological: He is alert and oriented to person, place, and time. Coordination normal.  Skin: Skin is warm and dry. No rash noted. He is not diaphoretic. No erythema. No pallor.  Psychiatric: He has a normal mood and affect. His behavior is normal. Judgment and thought content normal.     GDJ:MEQAS  Rhythm  -  Nonspecific T-abnormality.   ABNORMAL      ASSESSMENT AND PLAN

## 2014-06-28 NOTE — Patient Instructions (Signed)
Your physician has recommended that you wear an event monitor. Event monitors are medical devices that record the heart's electrical activity. Doctors most often [place] these monitors to diagnose arrhythmias. Arrhythmias are problems with the speed or rhythm of the heartbeat. The monitor is a small, portable device. You can wear one while you do your normal daily activities. This is usually used to diagnose what is causing palpitations/syncope (passing out).   Your physician has recommended you make the following change in your medication:  Your Amlodipine has been refilled   Your physician wants you to follow-up in: 6 months. You will receive a reminder letter in the mail two months in advance. If you don't receive a letter, please call our office to schedule the follow-up appointment.

## 2014-06-28 NOTE — Assessment & Plan Note (Signed)
He denies symptoms of angina at present time. Continue medical therapy.

## 2014-06-28 NOTE — Assessment & Plan Note (Signed)
Blood pressure is elevated. Amlodipine was recently added.

## 2014-06-28 NOTE — Assessment & Plan Note (Signed)
Lab Results  Component Value Date   HGBA1C 10.1* 05/14/2014   Poorly controlled. Seemed to be improving with recent changes in insulin regimen, however exacerbated by use of Prednisone last couple of weeks. Will set up earlier follow up with neurology.

## 2014-06-28 NOTE — Assessment & Plan Note (Addendum)
Symptoms of left arm weakness improving. Will set up follow up with neurology. Continue current medications. Reviewed hospital notes. ECHO and carotid doppler were normal. Question raised of whether 60-day holter might be helpful to pick up arrythmia leading to embolic event. NSR today. Will defer to cardiology.

## 2014-06-28 NOTE — Assessment & Plan Note (Signed)
BP Readings from Last 3 Encounters:  06/28/14 144/78  05/18/14 138/72  05/14/14 134/88   BP fairly well controlled. Amlodipine recently added while hospitalized. Will continue. Recent renal function normal.

## 2014-06-29 ENCOUNTER — Encounter: Payer: Self-pay | Admitting: Internal Medicine

## 2014-06-29 ENCOUNTER — Ambulatory Visit (INDEPENDENT_AMBULATORY_CARE_PROVIDER_SITE_OTHER): Payer: Medicare Other | Admitting: Internal Medicine

## 2014-06-29 VITALS — BP 122/72 | HR 61 | Temp 97.9°F | Resp 12 | Wt 249.0 lb

## 2014-06-29 DIAGNOSIS — E1149 Type 2 diabetes mellitus with other diabetic neurological complication: Secondary | ICD-10-CM | POA: Diagnosis not present

## 2014-06-29 NOTE — Progress Notes (Signed)
Patient ID: Steve Phillips, male   DOB: 28-Mar-1946, 68 y.o.   MRN: [id number]  HPI: Steve Phillips is a 68 y.o.-year-old male, returning for f/u for DM2, dx 1990's, insulin-dependent, uncontrolled, with complications (CAD, PN, CKD). Last visit 1.5 mo ago.  He had a stroke and Bells' palsy 2 weeks ago >> in the hospital >> started on steroids 50 mg x 4 days, then tapering >> sugars higher after this >> now improved. His strength in L arm and leg is much improved.  Last hemoglobin A1c was: Lab Results  Component Value Date   HGBA1C 10.1* 05/14/2014   HGBA1C 10.7* 01/27/2014   HGBA1C 10.4* 11/24/2013   Pt is now on: Metformin 1000 mg 2x a day.  Lantus 70 units at bedtime.  NovoLog as follows:  - 25 >> 30 >> 35 >> 40 units for a smaller meal  - 30 >> 35 >> 38 >> 43 units for a regular meal  - 33 >> 38 >> 40 >> 45 units for a large meal  He sometimes takes 46 units. NovoLog Sliding Scale:  - 150- 165: + 1 unit  - 166- 180: + 2 units  - 181- 195: + 3 units  - 196- 210: + 4 units  - 211- 225: + 5 units  - > 225: + 6 units   Pt checks his sugars 2-3x a day and they are - forgot log - ave 180-190 before the steroids : - am: 54 this am - ? (190-300) >> 94-213 >> 102-286 >> 95-191 >> 90-150 - 2h after b'fast: n/c >> 191, 266, 340, 356 >> 220-312 >> 191-301 >> forgot - before lunch: 200-350 >> 179, 197 >> 200-231 >> 211-227 >> forgot - 2h after lunch: n/c  >> 262 >> 302-403 >> 271, 287 >> forgot - before dinner: 200-350 >> 129, 156, 214, 216 >> (71) 102-293 >> 161 >> forgot  - 2h after dinner: n/c >> 234-375, most low 200s >> 229-388 >> 197-337 >> forgot - bedtime: high 200s >> 234, 266, 275 >> 256-473 >> 200-301 >> forgot No lows; he has hypoglycemia awareness at 70.  Highest sugar was 473 >> 474 x1 after steroids, 363 x1.  Pt's meals are: - Breakfast: 2 eggs, sausage, toast, coffee - Lunch: sandwich or leftovers - Dinner: meat + veggies + bread, tea or milk - Snacks: pm and late at  night  He started to snack less at night  - no CKD, last BUN/creatinine:  Lab Results  Component Value Date   BUN 11 05/14/2014   CREATININE 0.89 05/14/2014  On Lisinopril. He has  - last set of lipids: Lab Results  Component Value Date   CHOL 126 05/14/2014   HDL 34* 05/14/2014   LDLCALC 25 05/14/2014   LDLDIRECT 46.0 02/23/2013   TRIG 337* 05/14/2014   CHOLHDL 3.7 05/14/2014  On crestor. - last eye exam was 01/28/2014 ([name]). No DR.  - + no sensation in legs below knee. He sees his podiatrist q 3 mo ([name]). Foot exam performed 04/2014.  I reviewed pt's medications, allergies, PMH, social hx, family hx and no changes required, except as mentioned above.  ROS: Constitutional: no weight gain/loss, no fatigue, no increased appetite, no subjective hypothermia/hyper Eyes: + blurry vision, no xerophthalmia ENT: no sore throat, no nodules palpated in throat, no dysphagia/odynophagia, no hoarseness, + hypoacusis Cardiovascular: no CP/no SOB/palpitations/leg swelling Respiratory: no cough/SOB Gastrointestinal: no N/V/D/C Musculoskeletal: no muscle aches/no joint aches Skin:  no rashes Neurological: no tremors/+ numbness - up to knees/tingling/dizziness, + HA L PE: BP 122/72  Pulse 61  Temp(Src) 97.9 F (36.6 C) (Oral)  Resp 12  Wt 249 lb (112.946 kg)  SpO2 97% Wt Readings from Last 3 Encounters:  06/29/14 249 lb (112.946 kg)  06/28/14 249 lb 12 oz (113.286 kg)  06/28/14 250 lb 4 oz (113.513 kg)   Constitutional: overweight, in NAD Eyes: PERRLA, EOMI, no exophthalmos ENT: moist mucous membranes, no thyromegaly, no cervical lymphadenopathy Cardiovascular: RRR, No MRG, + leg swelling bilaterally - pitting Respiratory: CTA B Gastrointestinal: abdomen soft, NT, ND, BS+ Musculoskeletal: no deformities Skin: moist, warm, no rashes  ASSESSMENT: 1. DM2, insulin-dependent, uncontrolled, with complications - CAD, s/p multiple PCI with stent RCA,LAD and obtuse  marginal, followed @ [facility] on the accord study.., cath 02/2012 90% D1, s/p drug-eluting stent - Dr. Fletcher [name] - Peripheral neuropathy - CKD, mild  He was also in the Accord Study for 8 years.   PLAN:  1. Patient with long-standing, uncontrolled, diabetes, on a large dose of basal insulin + mealtime insulin + metformin, with improved control (except when on steroids)  - he did improve his diet >> less snacking at night - We discussed about options for treatment, and I suggested to:  Patient Instructions  Continue Metformin 1000 mg 2x a day.  Continue Lantus 70 units at bedtime.  Continue NovoLog as follows:  - 40 units for a smaller meal  - 43 units for a regular meal  - 45 units for a large meal  Continue NovoLog Sliding Scale:  - 150- 165: + 1 unit  - 166- 180: + 2 units  - 181- 195: + 3 units  - 196- 210: + 4 units  - 211- 225: + 5 units  - > 225: + 6 units  Please return in 1.5 month with your sugar log.  - given new sugar logs - continue checking sugars at different times of the day - check 3 times a day, rotating checks - up to date with eye exams - will have HbA1c checked net mo by PCP

## 2014-06-29 NOTE — Patient Instructions (Signed)
Continue Metformin 1000 mg 2x a day.  Continue Lantus 70 units at bedtime.  Continue NovoLog as follows:  - 40 units for a smaller meal  - 43 units for a regular meal  - 45 units for a large meal  Continue NovoLog Sliding Scale:  - 150- 165: + 1 unit  - 166- 180: + 2 units  - 181- 195: + 3 units  - 196- 210: + 4 units  - 211- 225: + 5 units  - > 225: + 6 units   Please return in 1.5 month with your sugar log.

## 2014-07-02 DIAGNOSIS — G20 Parkinson's disease: Secondary | ICD-10-CM | POA: Diagnosis not present

## 2014-07-02 DIAGNOSIS — G510 Bell's palsy: Secondary | ICD-10-CM | POA: Diagnosis not present

## 2014-08-02 DIAGNOSIS — E1159 Type 2 diabetes mellitus with other circulatory complications: Secondary | ICD-10-CM | POA: Diagnosis not present

## 2014-08-02 DIAGNOSIS — I739 Peripheral vascular disease, unspecified: Secondary | ICD-10-CM | POA: Diagnosis not present

## 2014-08-02 DIAGNOSIS — B351 Tinea unguium: Secondary | ICD-10-CM | POA: Diagnosis not present

## 2014-08-05 DIAGNOSIS — L0390 Cellulitis, unspecified: Secondary | ICD-10-CM | POA: Diagnosis not present

## 2014-08-05 DIAGNOSIS — B079 Viral wart, unspecified: Secondary | ICD-10-CM | POA: Diagnosis not present

## 2014-08-05 DIAGNOSIS — L0291 Cutaneous abscess, unspecified: Secondary | ICD-10-CM | POA: Diagnosis not present

## 2014-08-05 DIAGNOSIS — L821 Other seborrheic keratosis: Secondary | ICD-10-CM | POA: Diagnosis not present

## 2014-08-06 DIAGNOSIS — L089 Local infection of the skin and subcutaneous tissue, unspecified: Secondary | ICD-10-CM | POA: Diagnosis not present

## 2014-08-13 ENCOUNTER — Other Ambulatory Visit: Payer: Medicare Other

## 2014-08-13 ENCOUNTER — Encounter: Payer: Self-pay | Admitting: Internal Medicine

## 2014-08-13 ENCOUNTER — Ambulatory Visit (INDEPENDENT_AMBULATORY_CARE_PROVIDER_SITE_OTHER): Payer: Medicare Other | Admitting: Internal Medicine

## 2014-08-13 VITALS — BP 162/82 | HR 76 | Temp 97.8°F | Ht 72.0 in | Wt 258.0 lb

## 2014-08-13 DIAGNOSIS — I6350 Cerebral infarction due to unspecified occlusion or stenosis of unspecified cerebral artery: Secondary | ICD-10-CM | POA: Diagnosis not present

## 2014-08-13 DIAGNOSIS — E1149 Type 2 diabetes mellitus with other diabetic neurological complication: Secondary | ICD-10-CM | POA: Diagnosis not present

## 2014-08-13 MED ORDER — INSULIN REGULAR HUMAN (CONC) 500 UNIT/ML ~~LOC~~ SOLN: 100.0000 [IU] | Freq: Two times a day (BID) | SUBCUTANEOUS | Status: DC

## 2014-08-13 NOTE — Progress Notes (Signed)
Patient ID: Steve Phillips, male   DOB: 12/31/1945, 68 y.o.   MRN: [id number]  HPI: Steve Phillips is a 68 y.o.-year-old male, returning for f/u for DM2, dx 1990's, insulin-dependent, uncontrolled, with complications (CAD, PN, CKD). Last visit 1.5 mo ago.  Last hemoglobin A1c was: Lab Results  Component Value Date   HGBA1C 10.1* 05/14/2014   HGBA1C 10.7* 01/27/2014   HGBA1C 10.4* 11/24/2013   Pt is now on: Metformin 1000 mg 2x a day.  Lantus 70 units at bedtime.  NovoLog as follows:  - 25 >> 30 >> 35 >> 40 units for a smaller meal  - 30 >> 35 >> 38 >> 43 units for a regular meal  - 33 >> 38 >> 40 >> 46 units for a large meal - most of the time  He sometimes takes 51 units. NovoLog Sliding Scale:  - 150- 165: + 1 unit  - 166- 180: + 2 units  - 181- 195: + 3 units  - 196- 210: + 4 units  - 211- 225: + 5 units  - > 225: + 6 units   Pt checks his sugars 2-3x a day and they are - brings log - sugars poorly controlled: - am: 54 this am - ? (190-300) >> 94-213 >> 102-286 >> 95-191 >> 90-150 >> 127-217 - 2h after b'fast: n/c >> 191, 266, 340, 356 >> 220-312 >> 191-301 >> forgot >> 216-289 - before lunch: 200-350 >> 179, 197 >> 200-231 >> 211-227 >> forgot >> 237 - 2h after lunch: n/c  >> 262 >> 302-403 >> 271, 287 >> forgot >> 313 - before dinner: 200-350 >> 129, 156, 214, 216 >> (71) 102-293 >> 161 >> forgot >> 121, 216-246 - 2h after dinner: n/c >> 234-375, most low 200s >> 229-388 >> 197-337 >> forgot >> 199, 235, 284 - bedtime: high 200s >> 234, 266, 275 >> 256-473 >> 200-301 >> forgot >> 256, 307 No lows; he has hypoglycemia awareness at 68.  Highest sugar was in the 300s.  Pt's meals are: - Breakfast: 2 eggs, sausage, toast, coffee - Lunch: sandwich or leftovers - Dinner: meat + veggies + bread, tea or milk - Snacks: pm and late at night  - no CKD, last BUN/creatinine:  Lab Results  Component Value Date   BUN 11 05/14/2014   CREATININE 0.89 05/14/2014  On Lisinopril. He has  -  last set of lipids: Lab Results  Component Value Date   CHOL 126 05/14/2014   HDL 34* 05/14/2014   LDLCALC 25 05/14/2014   LDLDIRECT 46.0 02/23/2013   TRIG 337* 05/14/2014   CHOLHDL 3.7 05/14/2014  On crestor. - last eye exam was 01/28/2014 ([name]). No DR.  - + no sensation in legs below knee. He sees his podiatrist q 3 mo ([name]). Foot exam performed 04/2014.  I reviewed pt's medications, allergies, PMH, social hx, family hx and no changes required, except as mentioned above.  He had a stroke and Bells' palsy in 05/2014 >> in the hospital >> started on steroids 50 mg x 4 days, then tapering >> sugars higher after this.. His strength in L arm and leg is much improved.  ROS: Constitutional: + weight gain, + increased appetite, no fatigue, no subjective hypo/hyperthermia, + poor sleep Eyes: + blurry vision, no xerophthalmia ENT: no sore throat, no nodules palpated in throat, no dysphagia/odynophagia, no hoarseness, + hypoacusis Cardiovascular: no CP/no SOB/palpitations/leg swelling Respiratory: no cough/SOB Gastrointestinal: no N/V/D/C Musculoskeletal: no  muscle aches/no joint aches Skin: no rashes, +easy bruising Neurological: no tremors/+ numbness - up to knees/tingling/dizziness  PE: BP 162/82  Pulse 76  Temp(Src) 97.8 F (36.6 C) (Oral)  Ht 6' (1.829 m)  Wt 258 lb (117.028 kg)  BMI 34.98 kg/m2  SpO2 94% Wt Readings from Last 3 Encounters:  08/13/14 258 lb (117.028 kg)  06/29/14 249 lb (112.946 kg)  06/28/14 249 lb 12 oz (113.286 kg)   Constitutional: overweight, in NAD, walks with cane Eyes: PERRLA, EOMI, no exophthalmos ENT: moist mucous membranes, no thyromegaly, no cervical lymphadenopathy Cardiovascular: RRR, No MRG, + leg swelling bilaterally - pitting Respiratory: CTA B Gastrointestinal: abdomen soft, NT, ND, BS+ Musculoskeletal: no deformities Skin: moist, warm, no rashes  ASSESSMENT: 1. DM2, insulin-dependent, uncontrolled, with  complications - CAD, s/p multiple PCI with stent RCA,LAD and obtuse marginal, followed @ [facility] on the accord study.., cath 02/2012 90% D1, s/p drug-eluting stent - Dr. Fletcher [name] - Peripheral neuropathy - CKD, mild  He was also in the [facility]ord Study for 8 years.   PLAN:  1. Patient with long-standing, still uncontrolled, diabetes, on a large dose of basal insulin + mealtime insulin + metformin - We discussed about options for treatment, and I suggested to start U500 insulin as he is now taking220 units of insulin with still very uncontrolled sugars:  Patient Instructions  Please stop Lantus and NovoLog. Start U500 insulin 0.20 mL (20 units on the syringe) before breakfast and before dinner. Please inject 30 min before a meal. However, if you have a smaller meal, inject only 0.15 mL (15 units on the syringe). Please let me know if the sugars are consistently <80 or >200. Please return in 1 month with your sugar log.  Please stop at the lab after tomorrow to have a HbA1c drawn (not yet time to check). - he will continue Metformin - given new sugar logs - continue checking sugars at different times of the day - check 3 times a day, rotating checks - up to date with eye exams

## 2014-08-13 NOTE — Patient Instructions (Signed)
Please stop Lantus and NovoLog. Start U500 insulin 0.20 mL (20 units on the syringe) before breakfast and before dinner. Please inject 30 min before a meal. However, if you have a smaller meal, inject only 0.15 mL (15 units on the syringe). Please let me know if the sugars are consistently <80 or >200.  Please return in 1 month with your sugar log.   Please stop at the labs after tomorrow to have a HbA1c drawn.

## 2014-08-13 NOTE — Progress Notes (Signed)
Pre visit review using our clinic review tool, if applicable. No additional management support is needed unless otherwise documented below in the visit note. 

## 2014-08-16 DIAGNOSIS — C44621 Squamous cell carcinoma of skin of unspecified upper limb, including shoulder: Secondary | ICD-10-CM | POA: Diagnosis not present

## 2014-08-17 ENCOUNTER — Other Ambulatory Visit: Payer: Self-pay | Admitting: *Deleted

## 2014-08-17 ENCOUNTER — Ambulatory Visit: Payer: 59 | Admitting: Internal Medicine

## 2014-08-17 MED ORDER — INSULIN REGULAR HUMAN (CONC) 500 UNIT/ML ~~LOC~~ SOLN: 100.0000 [IU] | Freq: Two times a day (BID) | SUBCUTANEOUS | Status: DC

## 2014-08-18 ENCOUNTER — Encounter: Payer: Self-pay | Admitting: Internal Medicine

## 2014-08-18 ENCOUNTER — Ambulatory Visit (INDEPENDENT_AMBULATORY_CARE_PROVIDER_SITE_OTHER): Payer: Medicare Other | Admitting: Internal Medicine

## 2014-08-18 VITALS — BP 156/80 | HR 66 | Temp 98.0°F | Ht 72.0 in | Wt 254.8 lb

## 2014-08-18 DIAGNOSIS — R748 Abnormal levels of other serum enzymes: Secondary | ICD-10-CM | POA: Diagnosis not present

## 2014-08-18 DIAGNOSIS — E538 Deficiency of other specified B group vitamins: Secondary | ICD-10-CM

## 2014-08-18 DIAGNOSIS — Z23 Encounter for immunization: Secondary | ICD-10-CM

## 2014-08-18 DIAGNOSIS — I10 Essential (primary) hypertension: Secondary | ICD-10-CM | POA: Diagnosis not present

## 2014-08-18 DIAGNOSIS — E1149 Type 2 diabetes mellitus with other diabetic neurological complication: Secondary | ICD-10-CM

## 2014-08-18 DIAGNOSIS — C44621 Squamous cell carcinoma of skin of unspecified upper limb, including shoulder: Secondary | ICD-10-CM

## 2014-08-18 DIAGNOSIS — C44629 Squamous cell carcinoma of skin of left upper limb, including shoulder: Secondary | ICD-10-CM | POA: Insufficient documentation

## 2014-08-18 LAB — MICROALBUMIN / CREATININE URINE RATIO
CREATININE, U: 108.9 mg/dL
Microalb Creat Ratio: 90.5 mg/g — ABNORMAL HIGH (ref 0.0–30.0)
Microalb, Ur: 98.5 mg/dL — ABNORMAL HIGH (ref 0.0–1.9)

## 2014-08-18 LAB — COMPREHENSIVE METABOLIC PANEL
ALBUMIN: 3.7 g/dL (ref 3.5–5.2)
ALT: 18 U/L (ref 0–53)
AST: 25 U/L (ref 0–37)
Alkaline Phosphatase: 60 U/L (ref 39–117)
BILIRUBIN TOTAL: 0.5 mg/dL (ref 0.2–1.2)
BUN: 17 mg/dL (ref 6–23)
CALCIUM: 9.5 mg/dL (ref 8.4–10.5)
CO2: 22 mEq/L (ref 19–32)
Chloride: 101 mEq/L (ref 96–112)
Creatinine, Ser: 1.1 mg/dL (ref 0.4–1.5)
GFR: 69.58 mL/min (ref >60.00)
GLUCOSE: 196 mg/dL — AB (ref 70–99)
Potassium: 3.9 mEq/L (ref 3.5–5.1)
SODIUM: 138 meq/L (ref 135–145)
TOTAL PROTEIN: 6.9 g/dL (ref 6.0–8.3)

## 2014-08-18 LAB — LIPID PANEL
CHOLESTEROL: 174 mg/dL (ref 0–200)
HDL: 31.9 mg/dL — AB (ref >39.00)
NonHDL: 142.1
Total CHOL/HDL Ratio: 5
Triglycerides: 360 mg/dL — ABNORMAL HIGH (ref 0.0–149.0)
VLDL: 72 mg/dL — AB (ref 0.0–40.0)

## 2014-08-18 LAB — LDL CHOLESTEROL, DIRECT: LDL DIRECT: 90.5 mg/dL

## 2014-08-18 LAB — HEMOGLOBIN A1C: HEMOGLOBIN A1C: 9.9 % — AB (ref 4.6–6.5)

## 2014-08-18 MED ORDER — CARVEDILOL 25 MG PO TABS: ORAL_TABLET | ORAL | Status: DC

## 2014-08-18 MED ORDER — DULOXETINE HCL 60 MG PO CPEP: ORAL_CAPSULE | ORAL | Status: DC

## 2014-08-18 MED ORDER — CYANOCOBALAMIN 1000 MCG/ML IJ SOLN
1000.0000 ug | Freq: Once | INTRAMUSCULAR | Status: AC
  Administered 2014-08-18: 1000 ug via INTRAMUSCULAR

## 2014-08-18 NOTE — Progress Notes (Signed)
Pre visit review using our clinic review tool, if applicable. No additional management support is needed unless otherwise documented below in the visit note. 

## 2014-08-18 NOTE — Progress Notes (Signed)
Subjective:    Patient ID: Steve Phillips, male    DOB: November 27, 1945, 68 y.o.   MRN: [id number]  HPI 68YO male presents for follow up.  DM - BG have been better controlled. Mostly upper 100-200 range. Compliant with medications. Planning to start u-500 insulin, but has not received yet.  Recently had skin lesion removed left forearm with Dr. Phillip Heal. Lesion showed squamous cell cancer per pt. Wide excision performed Monday.  HTN - Has not taken BP meds yet today. No recent chest pain, headache.   Review of Systems  Constitutional: Negative for fever, chills, activity change, appetite change, fatigue and unexpected weight change.  Eyes: Negative for visual disturbance.  Respiratory: Negative for cough and shortness of breath.   Cardiovascular: Negative for chest pain, palpitations and leg swelling.  Gastrointestinal: Negative for abdominal pain and abdominal distention.  Genitourinary: Negative for dysuria, urgency and difficulty urinating.  Musculoskeletal: Positive for gait problem. Negative for arthralgias.  Skin: Positive for wound (left forearm at site of [facility] resection). Negative for color change and rash.  Hematological: Negative for adenopathy.  Psychiatric/Behavioral: Negative for sleep disturbance and dysphoric mood. The patient is not nervous/anxious.        Objective:    BP 156/80  Pulse 66  Temp(Src) 98 F (36.7 C) (Oral)  Ht 6' (1.829 m)  Wt 254 lb 12 oz (115.554 kg)  BMI 34.54 kg/m2  SpO2 96% Physical Exam  Constitutional: He is oriented to person, place, and time. He appears well-developed and well-nourished. No distress.  HENT:  Head: Normocephalic and atraumatic.  Right Ear: External ear normal.  Left Ear: External ear normal.  Nose: Nose normal.  Mouth/Throat: Oropharynx is clear and moist. No oropharyngeal exudate.  Eyes: Conjunctivae and EOM are normal. Pupils are equal, round, and reactive to light. Right eye exhibits no discharge. Left eye exhibits no  discharge. No scleral icterus.  Neck: Normal range of motion. Neck supple. No tracheal deviation present. No thyromegaly present.  Cardiovascular: Normal rate, regular rhythm and normal heart sounds.  Exam reveals no gallop and no friction rub.   No murmur heard. Pulmonary/Chest: Effort normal and breath sounds normal. No accessory muscle usage. Not tachypneic. No respiratory distress. He has no decreased breath sounds. He has no wheezes. He has no rhonchi. He has no rales. He exhibits no tenderness.  Musculoskeletal: Normal range of motion. He exhibits no edema.  Lymphadenopathy:    He has no cervical adenopathy.  Neurological: He is alert and oriented to person, place, and time. No cranial nerve deficit. Coordination normal.  Skin: Skin is warm and dry. No rash noted. He is not diaphoretic. No erythema. No pallor.     Psychiatric: He has a normal mood and affect. His behavior is normal. Judgment and thought content normal.          Assessment & Plan:   Problem List Items Addressed This Visit     High   DM (diabetes mellitus), type 2 with neurological complications - Primary      Lab Results  Component Value Date   HGBA1C 10.1* 05/14/2014   Will recheck A1c with labs today. Pt is planning to transition to U-500 insulin. Follow up with Dr. Cruzita Lederer as scheduled.    Relevant Orders      Comprehensive metabolic panel      Hemoglobin A1c      Lipid panel      Microalbumin / creatinine urine ratio   Hypertension  BP Readings from Last 3 Encounters:  08/18/14 156/80  08/13/14 162/82  06/29/14 122/72   BP slightly elevated, however has not take BP meds today. Will continue to monitor for now. Renal function with labs today.    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 Phillip Heal. Incision site clean.        Return in about 3 months (around 11/17/2014) for  Recheck of Diabetes.

## 2014-08-18 NOTE — Assessment & Plan Note (Signed)
Will request recent notes from Dr. Phillip Heal. Incision site clean.

## 2014-08-18 NOTE — Assessment & Plan Note (Signed)
BP Readings from Last 3 Encounters:  08/18/14 156/80  08/13/14 162/82  06/29/14 122/72   BP slightly elevated, however has not take BP meds today. Will continue to monitor for now. Renal function with labs today.

## 2014-08-18 NOTE — Assessment & Plan Note (Signed)
Lab Results  Component Value Date   HGBA1C 10.1* 05/14/2014   Will recheck A1c with labs today. Pt is planning to transition to U-500 insulin. Follow up with Dr. Cruzita Lederer as scheduled.

## 2014-08-18 NOTE — Addendum Note (Signed)
Addended by: Vernetta Honey on: 08/18/2014 09:53 AM   Modules accepted: Orders

## 2014-08-18 NOTE — Patient Instructions (Signed)
Flu shot today.  Labs today.  

## 2014-09-16 ENCOUNTER — Ambulatory Visit: Payer: Medicare Other | Admitting: Internal Medicine

## 2014-09-20 ENCOUNTER — Telehealth: Payer: Self-pay | Admitting: Internal Medicine

## 2014-09-20 ENCOUNTER — Other Ambulatory Visit: Payer: Self-pay | Admitting: Internal Medicine

## 2014-09-20 DIAGNOSIS — G44209 Tension-type headache, unspecified, not intractable: Secondary | ICD-10-CM

## 2014-09-20 MED ORDER — HYDROCODONE-ACETAMINOPHEN 5-325 MG PO TABS: 1.0000 | ORAL_TABLET | Freq: Three times a day (TID) | ORAL | Status: DC | PRN

## 2014-09-20 NOTE — Telephone Encounter (Signed)
Next 32min this month.

## 2014-09-20 NOTE — Telephone Encounter (Signed)
Appt scheduled 11/16/14, if you need him to be seen sooner, where can he be added?

## 2014-09-20 NOTE — Telephone Encounter (Signed)
Mr. Kinn' wife said Dr. Gilford Rile wants to see him sometime this month. I didn't see an appt available. Please call the pt if he can come in sooner than his Dec appt. Pt ph# [phone] Thank you.

## 2014-09-21 NOTE — Telephone Encounter (Signed)
The patient has been scheduled and is aware of his appointment.

## 2014-09-24 ENCOUNTER — Other Ambulatory Visit: Payer: Self-pay | Admitting: Internal Medicine

## 2014-10-07 ENCOUNTER — Ambulatory Visit (INDEPENDENT_AMBULATORY_CARE_PROVIDER_SITE_OTHER): Payer: Medicare Other | Admitting: Internal Medicine

## 2014-10-07 ENCOUNTER — Encounter: Payer: Self-pay | Admitting: Internal Medicine

## 2014-10-07 VITALS — BP 142/78 | HR 77 | Temp 97.8°F | Ht 72.0 in | Wt 263.5 lb

## 2014-10-07 VITALS — BP 128/70 | HR 80 | Temp 98.0°F | Resp 12 | Wt 262.0 lb

## 2014-10-07 DIAGNOSIS — I639 Cerebral infarction, unspecified: Secondary | ICD-10-CM | POA: Diagnosis not present

## 2014-10-07 DIAGNOSIS — E538 Deficiency of other specified B group vitamins: Secondary | ICD-10-CM

## 2014-10-07 DIAGNOSIS — E1149 Type 2 diabetes mellitus with other diabetic neurological complication: Secondary | ICD-10-CM

## 2014-10-07 DIAGNOSIS — I10 Essential (primary) hypertension: Secondary | ICD-10-CM | POA: Diagnosis not present

## 2014-10-07 DIAGNOSIS — E1140 Type 2 diabetes mellitus with diabetic neuropathy, unspecified: Secondary | ICD-10-CM

## 2014-10-07 DIAGNOSIS — G44229 Chronic tension-type headache, not intractable: Secondary | ICD-10-CM | POA: Diagnosis not present

## 2014-10-07 MED ORDER — CYANOCOBALAMIN 1000 MCG/ML IJ SOLN
1000.0000 ug | Freq: Once | INTRAMUSCULAR | Status: AC
  Administered 2014-10-07: 1000 ug via INTRAMUSCULAR

## 2014-10-07 NOTE — Assessment & Plan Note (Signed)
Reviewed instructions from Dr. Cruzita Lederer with pt today. He understands how to take his U500 insulin. Will monitor BG and plan repeat A1c in 10/2014.

## 2014-10-07 NOTE — Patient Instructions (Signed)
Please STOP Novolog!  Increase U500 insulin as follows: 10 units before a small meal 15 units before a regular meal 20 units before a large meal or if you have dessert  Please let me know if the sugars are consistently <80 or >200. Please return in 1 month with your sugar log.

## 2014-10-07 NOTE — Progress Notes (Signed)
Patient ID: Steve Phillips, male   DOB: 04-15-1946, 68 y.o.   MRN: [id number]  HPI: Steve Phillips is a 68 y.o.-year-old male, returning for f/u for DM2, dx 1990's, insulin-dependent, uncontrolled, with complications (CAD, PN, CKD). Last visit 1.5 mo ago.  Last hemoglobin A1c was: Lab Results  Component Value Date   HGBA1C 9.9* 08/18/2014   HGBA1C 10.1* 05/14/2014   HGBA1C 10.7* 01/27/2014   Pt was on: Metformin 1000 mg 2x a day.  Lantus 70 units at bedtime.  NovoLog as follows:  - 25 >> 30 >> 35 >> 40 units for a smaller meal  - 30 >> 35 >> 38 >> 43 units for a regular meal  - 33 >> 38 >> 40 >> 46 units for a large meal - most of the time  He sometimes takes 51 units. NovoLog Sliding Scale:  - 150- 165: + 1 unit  - 166- 180: + 2 units  - 181- 195: + 3 units  - 196- 210: + 4 units  - 211- 225: + 5 units  - > 225: + 6 units   At last visit, we changed to U500 insulin: 0.15 mL (15 units on the syringe) before breakfast and before dinner For smaller meal, injects only 0.10 mL (10 units on the syringe) or even 0.05 mL (5 units on the syringe) He also takes the NovoLog as previously prescribed!!!!!!!!!!!!!!!!!!!!!!!!!!!!!!   Pt checks his sugars 2-3x a day and they are - brings log - sugars better + lows: - am: 54 this am - ? (190-300) >> 94-213 >> 102-286 >> 95-191 >> 90-150 >> 127-217 >> 98-150s, but some 200-276 ~ eating at night - 2h after b'fast: n/c >> 191, 266, 340, 356 >> 220-312 >> 191-301 >> forgot >> 216-289 >> n/c - before lunch: 200-350 >> 179, 197 >> 200-231 >> 211-227 >> forgot >> 237 >> 165-231 - 2h after lunch: n/c  >> 262 >> 302-403 >> 271, 287 >> forgot >> 313 >> 126 - before dinner: 200-350 >> 129, 156, 214, 216 >> (71) 102-293 >> 161 >> forgot >> 121, 216-246 >> 58 -185 - 2h after dinner: n/c >> 234-375, most low 200s >> 229-388 >> 197-337 >> forgot >> 199, 235, 284 >> 145-158, 299x1 - bedtime: high 200s >> 234, 266, 275 >> 256-473 >> 200-301 >> forgot >> 256, 307 >>  54x1, 81-119 + lows - lowest 54; he has hypoglycemia awareness at 68.  Highest sugar was 299.  Pt's meals are: - Breakfast: 2 eggs, sausage, toast, coffee - Lunch: sandwich or leftovers - Dinner: meat + veggies + bread, tea or milk - Snacks: pm and late at night  - + mild CKD, last BUN/creatinine:  Lab Results  Component Value Date   BUN 17 08/18/2014   CREATININE 1.1 08/18/2014  On Lisinopril.  - last set of lipids: Lab Results  Component Value Date   CHOL 174 08/18/2014   HDL 31.90* 08/18/2014   LDLCALC 25 05/14/2014   LDLDIRECT 90.5 08/18/2014   TRIG 360.0* 08/18/2014   CHOLHDL 5 08/18/2014  On Crestor. - last eye exam was 01/28/2014 ([name]). No DR.  - + no sensation in legs below knee. He sees his podiatrist q 3 mo ([name]). Foot exam performed 04/2014.  I reviewed pt's medications, allergies, PMH, social hx, family hx and no changes required, except as mentioned above.  He had a stroke and Bells' palsy in 05/2014 >> in the hospital >> started  on steroids 50 mg x 4 days, then tapering >> sugars higher after this.. His strength in L arm and leg is much improved.  ROS: Constitutional: + weight gain, + increased appetite, no fatigue, no subjective hypo/hyperthermia Eyes:no blurry vision, no xerophthalmia ENT: no sore throat, no nodules palpated in throat, no dysphagia/odynophagia, no hoarseness, + hypoacusis Cardiovascular: no CP/no SOB/palpitations/leg swelling Respiratory: no cough/SOB Gastrointestinal: no N/V/D/C Musculoskeletal: no muscle aches/no joint aches Skin: no rashes Neurological: no tremors/+ numbness - up to knees/tingling/dizziness  PE: BP 128/70 mmHg  Pulse 80  Temp(Src) 98 F (36.7 C) (Oral)  Resp 12  Wt 262 lb (118.842 kg)  SpO2 95% Wt Readings from Last 3 Encounters:  10/07/14 262 lb (118.842 kg)  08/18/14 254 lb 12 oz (115.554 kg)  08/13/14 258 lb (117.028 kg)   Constitutional: overweight, in NAD, walks with cane Eyes:  PERRLA, EOMI, no exophthalmos ENT: moist mucous membranes, no thyromegaly, no cervical lymphadenopathy Cardiovascular: RRR, No MRG, + leg swelling bilaterally - pitting Respiratory: CTA B Gastrointestinal: abdomen soft, NT, ND, BS+ Musculoskeletal: no deformities Skin: moist, warm, no rashes  ASSESSMENT: 1. DM2, insulin-dependent, uncontrolled, with complications - CAD, s/p multiple PCI with stent RCA,LAD and obtuse marginal, followed @ [facility] on the accord study.., cath 02/2012 90% D1, s/p drug-eluting stent - Dr. Fletcher [name] - Peripheral neuropathy - CKD, mild  He was also in the [facility] Study for 8 years.   PLAN:  1. Patient with long-standing, uncontrolled, diabetes, now on U500 insulin bid + metformin, but he also takes full dose of NovoLog!!!! Despite my advice to stop this when starts U500 >> low CBGs.  - I suggested to:  Patient Instructions  Please STOP Novolog!  Increase U500 insulin as follows: 10 units before a small meal 15 units before a regular meal 20 units before a large meal or if you have dessert  Please let me know if the sugars are consistently <80 or >200. Please return in 1 month with your sugar log.   - he will continue Metformin - given new sugar logs - he had flu shot this season - continue checking sugars at different times of the day - check 3 times a day, rotating checks - up to date with eye exams and foot exams - RTC in 1 mo - will check HbA1c then

## 2014-10-07 NOTE — Progress Notes (Signed)
`  Subjective:    Patient ID: Steve Phillips, male    DOB: January 30, 1946, 68 y.o.   MRN: [id number]  HPI 68YO male presents for follow up.  DM - Seen by Dr. Cruzita Lederer today. Instructed not to use Novolog SSI as prior to use of U-500 insulin. Started having some low BG on the U-500. 40-50s. Improved with taking orange juice.  HTN - BP was well controlled recently. However ate pork for lunch. No chest pain, palpitations. Some chronic headache, controlled with Tizanadine.  Wt Readings from Last 3 Encounters:  10/07/14 263 lb 8 oz (119.523 kg)  10/07/14 262 lb (118.842 kg)  08/18/14 254 lb 12 oz (115.554 kg)     Review of Systems  Constitutional: Negative for fever, chills, activity change, appetite change, fatigue and unexpected weight change.  Eyes: Negative for visual disturbance.  Respiratory: Negative for cough and shortness of breath.   Cardiovascular: Negative for chest pain, palpitations and leg swelling.  Gastrointestinal: Negative for abdominal pain, diarrhea, constipation and abdominal distention.  Genitourinary: Negative for dysuria, urgency and difficulty urinating.  Musculoskeletal: Positive for gait problem. Negative for arthralgias.  Skin: Negative for color change and rash.  Neurological: Positive for weakness and numbness.  Hematological: Negative for adenopathy.  Psychiatric/Behavioral: Negative for sleep disturbance and dysphoric mood. The patient is not nervous/anxious.        Objective:    BP 142/78 mmHg  Pulse 77  Temp(Src) 97.8 F (36.6 C) (Oral)  Ht 6' (1.829 m)  Wt 263 lb 8 oz (119.523 kg)  BMI 35.73 kg/m2  SpO2 96% Physical Exam  Constitutional: He is oriented to person, place, and time. He appears well-developed and well-nourished. No distress.  HENT:  Head: Normocephalic and atraumatic.  Right Ear: External ear normal.  Left Ear: External ear normal.  Nose: Nose normal.  Mouth/Throat: Oropharynx is clear and moist. No oropharyngeal exudate.  Eyes:  Conjunctivae and EOM are normal. Pupils are equal, round, and reactive to light. Right eye exhibits no discharge. Left eye exhibits no discharge. No scleral icterus.  Neck: Normal range of motion. Neck supple. No tracheal deviation present. No thyromegaly present.  Cardiovascular: Normal rate, regular rhythm and normal heart sounds.  Exam reveals no gallop and no friction rub.   No murmur heard. Pulmonary/Chest: Effort normal and breath sounds normal. No accessory muscle usage. No tachypnea. No respiratory distress. He has no decreased breath sounds. He has no wheezes. He has no rhonchi. He has no rales. He exhibits no tenderness.  Musculoskeletal: Normal range of motion. He exhibits no edema.  Lymphadenopathy:    He has no cervical adenopathy.  Neurological: He is alert and oriented to person, place, and time. No cranial nerve deficit. Coordination normal.  Skin: Skin is warm and dry. No rash noted. He is not diaphoretic. No erythema. No pallor.  Psychiatric: He has a normal mood and affect. His behavior is normal. Judgment and thought content normal.          Assessment & Plan:   Problem List Items Addressed This Visit      High   DM (diabetes mellitus), type 2 with neurological complications    Reviewed instructions from Dr. Cruzita Lederer with pt today. He understands how to take his U500 insulin. Will monitor BG and plan repeat A1c in 10/2014.    Hypertension - Primary    BP Readings from Last 3 Encounters:  10/07/14 142/78  10/07/14 128/70  08/18/14 156/80   BP initially elevated on check  today, then improved. Will continue current medications. Continue to monitor BP.      Unprioritized   Headache    Chronic severe headaches. Improved with Tizadine and prn Hydrocodone. Encouraged he limit use of these meds to severe headache only given risk of falls on sedating medication.        Return if symptoms worsen or fail to improve.

## 2014-10-07 NOTE — Assessment & Plan Note (Signed)
BP Readings from Last 3 Encounters:  10/07/14 142/78  10/07/14 128/70  08/18/14 156/80   BP initially elevated on check today, then improved. Will continue current medications. Continue to monitor BP.

## 2014-10-07 NOTE — Addendum Note (Signed)
Addended by: Vernetta Honey on: 10/07/2014 03:22 PM   Modules accepted: Orders

## 2014-10-07 NOTE — Patient Instructions (Signed)
Follow up 10/2014 as scheduled.

## 2014-10-07 NOTE — Assessment & Plan Note (Signed)
Chronic severe headaches. Improved with Tizadine and prn Hydrocodone. Encouraged he limit use of these meds to severe headache only given risk of falls on sedating medication.

## 2014-10-07 NOTE — Progress Notes (Signed)
Pre visit review using our clinic review tool, if applicable. No additional management support is needed unless otherwise documented below in the visit note. 

## 2014-10-18 ENCOUNTER — Ambulatory Visit: Payer: Medicare Other | Admitting: Internal Medicine

## 2014-10-27 ENCOUNTER — Other Ambulatory Visit: Payer: Self-pay | Admitting: *Deleted

## 2014-10-27 DIAGNOSIS — E1165 Type 2 diabetes mellitus with hyperglycemia: Secondary | ICD-10-CM

## 2014-10-27 DIAGNOSIS — IMO0002 Reserved for concepts with insufficient information to code with codable children: Secondary | ICD-10-CM

## 2014-10-27 MED ORDER — GLUCOSE BLOOD VI STRP: ORAL_STRIP | Status: DC

## 2014-10-27 MED ORDER — LISINOPRIL-HYDROCHLOROTHIAZIDE 20-12.5 MG PO TABS: 1.0000 | ORAL_TABLET | Freq: Two times a day (BID) | ORAL | Status: DC

## 2014-10-27 MED ORDER — METFORMIN HCL 1000 MG PO TABS: ORAL_TABLET | ORAL | Status: DC

## 2014-10-28 MED ORDER — CLOPIDOGREL BISULFATE 75 MG PO TABS: 75.0000 mg | ORAL_TABLET | Freq: Every day | ORAL | Status: DC

## 2014-10-28 MED ORDER — DULOXETINE HCL 60 MG PO CPEP: ORAL_CAPSULE | ORAL | Status: DC

## 2014-10-28 MED ORDER — PREGABALIN 150 MG PO CAPS: 150.0000 mg | ORAL_CAPSULE | Freq: Two times a day (BID) | ORAL | Status: DC

## 2014-10-28 MED ORDER — PANTOPRAZOLE SODIUM 40 MG PO TBEC: 40.0000 mg | DELAYED_RELEASE_TABLET | Freq: Two times a day (BID) | ORAL | Status: AC

## 2014-10-29 ENCOUNTER — Other Ambulatory Visit: Payer: Self-pay | Admitting: Internal Medicine

## 2014-11-01 DIAGNOSIS — I739 Peripheral vascular disease, unspecified: Secondary | ICD-10-CM | POA: Diagnosis not present

## 2014-11-01 DIAGNOSIS — B351 Tinea unguium: Secondary | ICD-10-CM | POA: Diagnosis not present

## 2014-11-01 DIAGNOSIS — E1142 Type 2 diabetes mellitus with diabetic polyneuropathy: Secondary | ICD-10-CM | POA: Diagnosis not present

## 2014-11-01 DIAGNOSIS — M79604 Pain in right leg: Secondary | ICD-10-CM | POA: Diagnosis not present

## 2014-11-01 DIAGNOSIS — G214 Vascular parkinsonism: Secondary | ICD-10-CM | POA: Diagnosis not present

## 2014-11-03 ENCOUNTER — Telehealth: Payer: Self-pay | Admitting: *Deleted

## 2014-11-03 ENCOUNTER — Other Ambulatory Visit: Payer: Self-pay | Admitting: *Deleted

## 2014-11-03 DIAGNOSIS — Z85828 Personal history of other malignant neoplasm of skin: Secondary | ICD-10-CM | POA: Diagnosis not present

## 2014-11-03 DIAGNOSIS — Z1283 Encounter for screening for malignant neoplasm of skin: Secondary | ICD-10-CM | POA: Diagnosis not present

## 2014-11-03 DIAGNOSIS — L570 Actinic keratosis: Secondary | ICD-10-CM | POA: Diagnosis not present

## 2014-11-03 DIAGNOSIS — D485 Neoplasm of uncertain behavior of skin: Secondary | ICD-10-CM | POA: Diagnosis not present

## 2014-11-03 DIAGNOSIS — L918 Other hypertrophic disorders of the skin: Secondary | ICD-10-CM | POA: Diagnosis not present

## 2014-11-03 DIAGNOSIS — Z08 Encounter for follow-up examination after completed treatment for malignant neoplasm: Secondary | ICD-10-CM | POA: Diagnosis not present

## 2014-11-03 MED ORDER — INSULIN ASPART 100 UNIT/ML ~~LOC~~ SOLN: SUBCUTANEOUS | Status: DC

## 2014-11-03 MED ORDER — INSULIN GLARGINE 300 UNIT/ML ~~LOC~~ SOPN: 70.0000 [IU] | PEN_INJECTOR | Freq: Two times a day (BID) | SUBCUTANEOUS | Status: DC

## 2014-11-03 NOTE — Telephone Encounter (Signed)
Hm, OK, let's send Toujeo at these doses.

## 2014-11-03 NOTE — Telephone Encounter (Signed)
Pt's wife called stating that pt has had to stop taking the U500. Pt stayed sick on it. He has went back on the Lantus and Novolog. Pt feels better. Pt needs a refill of Lantus ( 70 units in the AM and 70 units in the PM) please advise if ok to refill.

## 2014-11-07 ENCOUNTER — Other Ambulatory Visit: Payer: Self-pay | Admitting: Internal Medicine

## 2014-11-08 NOTE — Telephone Encounter (Signed)
Ok refill? 

## 2014-11-09 ENCOUNTER — Emergency Department: Payer: Self-pay | Admitting: Emergency Medicine

## 2014-11-09 ENCOUNTER — Encounter: Payer: Self-pay | Admitting: Internal Medicine

## 2014-11-09 ENCOUNTER — Ambulatory Visit (INDEPENDENT_AMBULATORY_CARE_PROVIDER_SITE_OTHER): Payer: Medicare Other | Admitting: Internal Medicine

## 2014-11-09 VITALS — BP 179/78 | HR 72 | Temp 97.8°F | Ht 72.0 in | Wt 253.5 lb

## 2014-11-09 DIAGNOSIS — M545 Low back pain: Secondary | ICD-10-CM | POA: Diagnosis not present

## 2014-11-09 DIAGNOSIS — M79651 Pain in right thigh: Secondary | ICD-10-CM | POA: Diagnosis not present

## 2014-11-09 DIAGNOSIS — M7989 Other specified soft tissue disorders: Secondary | ICD-10-CM | POA: Diagnosis not present

## 2014-11-09 DIAGNOSIS — M5126 Other intervertebral disc displacement, lumbar region: Secondary | ICD-10-CM | POA: Diagnosis not present

## 2014-11-09 DIAGNOSIS — M79604 Pain in right leg: Secondary | ICD-10-CM

## 2014-11-09 DIAGNOSIS — M5431 Sciatica, right side: Secondary | ICD-10-CM | POA: Diagnosis not present

## 2014-11-09 DIAGNOSIS — E119 Type 2 diabetes mellitus without complications: Secondary | ICD-10-CM | POA: Diagnosis not present

## 2014-11-09 DIAGNOSIS — I639 Cerebral infarction, unspecified: Secondary | ICD-10-CM | POA: Diagnosis not present

## 2014-11-09 DIAGNOSIS — M5430 Sciatica, unspecified side: Secondary | ICD-10-CM | POA: Diagnosis not present

## 2014-11-09 DIAGNOSIS — I10 Essential (primary) hypertension: Secondary | ICD-10-CM | POA: Diagnosis not present

## 2014-11-09 DIAGNOSIS — M5441 Lumbago with sciatica, right side: Secondary | ICD-10-CM | POA: Diagnosis not present

## 2014-11-09 MED ORDER — HYDROCODONE-ACETAMINOPHEN 5-325 MG PO TABS: 1.0000 | ORAL_TABLET | Freq: Three times a day (TID) | ORAL | Status: DC | PRN

## 2014-11-09 NOTE — Patient Instructions (Addendum)
To ER for further evaluation and pain management.

## 2014-11-09 NOTE — Progress Notes (Signed)
Pre visit review using our clinic review tool, if applicable. No additional management support is needed unless otherwise documented below in the visit note. 

## 2014-11-09 NOTE — Assessment & Plan Note (Signed)
Severe right leg pain, mostly localized to right thigh. He has progressive neuropathy and this pain likely reflects this. Minimal swelling in distal leg and probability DVT low, however would recommend check of [place] to confirm this. No findings or history to suggest bony injury. No findings to suggest cellulitis. Initially, we had planned for outpatient [place] and pain management, but given that Hydrocodone and Lyrica have not improved pain at all at home, opted to send to ED for acute pain management with IV meds. Called triage at [facility] ED to relay information.

## 2014-11-09 NOTE — Progress Notes (Signed)
Subjective:    Patient ID: Steve Phillips, male    DOB: [birth year], 68 y.o.   MRN: [id number]  HPI  68YO male presents for acute visit.  Severe pain in right leg over last couple of weeks.  Described as aching starting at upper thigh and down to knee. He has no feeling below knee.  Progressively getting worse. No change in pain with activity. Taking Hydrocodone 1 pill tid with no improvement. Taking Lyrica 150mg  twice daily with no improvement. No trauma to leg. No overlying skin changes or swelling.    Past medical, surgical, family and social history per today's encounter.  Review of Systems  Constitutional: Negative for fever, chills, activity change, appetite change, fatigue and unexpected weight change.  Eyes: Negative for visual disturbance.  Respiratory: Negative for cough and shortness of breath.   Cardiovascular: Positive for leg swelling (mild bilateral LE chronic). Negative for chest pain and palpitations.  Gastrointestinal: Negative for abdominal pain and abdominal distention.  Genitourinary: Negative for dysuria, urgency and difficulty urinating.  Musculoskeletal: Positive for myalgias and arthralgias. Negative for gait problem.  Skin: Negative for color change, rash and wound.  Hematological: Negative for adenopathy.  Psychiatric/Behavioral: Positive for sleep disturbance. Negative for dysphoric mood. The patient is not nervous/anxious.        Objective:    BP 179/78 mmHg  Pulse 72  Temp(Src) 97.8 F (36.6 C) (Oral)  Ht 6' (1.829 m)  Wt 253 lb 8 oz (114.987 kg)  BMI 34.37 kg/m2  SpO2 97% Physical Exam  Constitutional: He is oriented to person, place, and time. He appears well-developed and well-nourished. No distress.  HENT:  Head: Normocephalic and atraumatic.  Right Ear: External ear normal.  Left Ear: External ear normal.  Nose: Nose normal.  Mouth/Throat: Oropharynx is clear and moist.  Eyes: Conjunctivae and EOM are normal. Pupils are equal, round,  and reactive to light. Right eye exhibits no discharge. Left eye exhibits no discharge. No scleral icterus.  Neck: Normal range of motion. Neck supple. No tracheal deviation present. No thyromegaly present.  Cardiovascular: Normal rate, regular rhythm and normal heart sounds.  Exam reveals no gallop and no friction rub.   No murmur heard. Pulmonary/Chest: Effort normal and breath sounds normal. No accessory muscle usage. No tachypnea. No respiratory distress. He has no decreased breath sounds. He has no wheezes. He has no rhonchi. He has no rales. He exhibits no tenderness.  Musculoskeletal: Normal range of motion. He exhibits no edema.       Legs: Lymphadenopathy:    He has no cervical adenopathy.  Neurological: He is alert and oriented to person, place, and time. No cranial nerve deficit. Coordination normal.  Skin: Skin is warm and dry. No rash noted. He is not diaphoretic. No erythema. No pallor.  Psychiatric: He has a normal mood and affect. His behavior is normal. Judgment and thought content normal.          Assessment & Plan:  Over 52min of which >50% spent in face-to-face contact with patient discussing plan of care  Problem List Items Addressed This Visit      Unprioritized   Right leg pain - Primary    Severe right leg pain, mostly localized to right thigh. He has progressive neuropathy and this pain likely reflects this. Minimal swelling in distal leg and probability DVT low, however would recommend check of [place] to confirm this. No findings or history to suggest bony injury. No findings to suggest cellulitis. Initially, we  had planned for outpatient [place] and pain management, but given that Hydrocodone and Lyrica have not improved pain at all at home, opted to send to ED for acute pain management with IV meds. Called triage at [facility] ED to relay information.    Relevant Medications      HYDROcodone-acetaminophen (NORCO/VICODIN) 5-325 MG per tablet   Other Relevant Orders       Lower Extremity Venous Duplex Right       Return in about 4 weeks (around 12/07/2014) for Recheck.

## 2014-11-10 ENCOUNTER — Emergency Department: Payer: Self-pay | Admitting: Emergency Medicine

## 2014-11-10 DIAGNOSIS — Z794 Long term (current) use of insulin: Secondary | ICD-10-CM | POA: Diagnosis not present

## 2014-11-10 DIAGNOSIS — Z7952 Long term (current) use of systemic steroids: Secondary | ICD-10-CM | POA: Diagnosis not present

## 2014-11-10 DIAGNOSIS — Z7902 Long term (current) use of antithrombotics/antiplatelets: Secondary | ICD-10-CM | POA: Diagnosis not present

## 2014-11-10 DIAGNOSIS — M545 Low back pain: Secondary | ICD-10-CM | POA: Diagnosis not present

## 2014-11-10 DIAGNOSIS — M5126 Other intervertebral disc displacement, lumbar region: Secondary | ICD-10-CM | POA: Diagnosis not present

## 2014-11-10 DIAGNOSIS — I10 Essential (primary) hypertension: Secondary | ICD-10-CM | POA: Diagnosis not present

## 2014-11-10 DIAGNOSIS — Z7982 Long term (current) use of aspirin: Secondary | ICD-10-CM | POA: Diagnosis not present

## 2014-11-10 DIAGNOSIS — Z79899 Other long term (current) drug therapy: Secondary | ICD-10-CM | POA: Diagnosis not present

## 2014-11-10 DIAGNOSIS — E119 Type 2 diabetes mellitus without complications: Secondary | ICD-10-CM | POA: Diagnosis not present

## 2014-11-10 DIAGNOSIS — M5441 Lumbago with sciatica, right side: Secondary | ICD-10-CM | POA: Diagnosis not present

## 2014-11-11 ENCOUNTER — Telehealth: Payer: Self-pay | Admitting: *Deleted

## 2014-11-11 NOTE — Telephone Encounter (Signed)
Tried to reach pt by both home and cell phone, to notify that RX is ready for pick up.  No vm available

## 2014-11-15 ENCOUNTER — Ambulatory Visit: Payer: Medicare Other | Admitting: Internal Medicine

## 2014-11-16 ENCOUNTER — Ambulatory Visit (INDEPENDENT_AMBULATORY_CARE_PROVIDER_SITE_OTHER): Payer: Medicare Other | Admitting: Internal Medicine

## 2014-11-16 ENCOUNTER — Encounter: Payer: Self-pay | Admitting: Internal Medicine

## 2014-11-16 VITALS — BP 144/80 | HR 61 | Temp 97.6°F | Ht 72.0 in | Wt 257.8 lb

## 2014-11-16 DIAGNOSIS — E785 Hyperlipidemia, unspecified: Secondary | ICD-10-CM

## 2014-11-16 DIAGNOSIS — E1140 Type 2 diabetes mellitus with diabetic neuropathy, unspecified: Secondary | ICD-10-CM

## 2014-11-16 DIAGNOSIS — I10 Essential (primary) hypertension: Secondary | ICD-10-CM | POA: Diagnosis not present

## 2014-11-16 DIAGNOSIS — Z79899 Other long term (current) drug therapy: Secondary | ICD-10-CM

## 2014-11-16 DIAGNOSIS — M79604 Pain in right leg: Secondary | ICD-10-CM | POA: Diagnosis not present

## 2014-11-16 DIAGNOSIS — I639 Cerebral infarction, unspecified: Secondary | ICD-10-CM | POA: Diagnosis not present

## 2014-11-16 DIAGNOSIS — E538 Deficiency of other specified B group vitamins: Secondary | ICD-10-CM

## 2014-11-16 DIAGNOSIS — R972 Elevated prostate specific antigen [PSA]: Secondary | ICD-10-CM

## 2014-11-16 DIAGNOSIS — E1149 Type 2 diabetes mellitus with other diabetic neurological complication: Secondary | ICD-10-CM

## 2014-11-16 LAB — COMPREHENSIVE METABOLIC PANEL
ALT: 7 U/L (ref 0–53)
AST: 19 U/L (ref 0–37)
Albumin: 3.4 g/dL — ABNORMAL LOW (ref 3.5–5.2)
Alkaline Phosphatase: 61 U/L (ref 39–117)
BILIRUBIN TOTAL: 0.3 mg/dL (ref 0.2–1.2)
BUN: 20 mg/dL (ref 6–23)
CHLORIDE: 104 meq/L (ref 96–112)
CO2: 27 meq/L (ref 19–32)
CREATININE: 1 mg/dL (ref 0.4–1.5)
Calcium: 9 mg/dL (ref 8.4–10.5)
GFR: 77.54 mL/min (ref >60.00)
Glucose, Bld: 86 mg/dL (ref 70–99)
Potassium: 3.4 mEq/L — ABNORMAL LOW (ref 3.5–5.1)
Sodium: 140 mEq/L (ref 135–145)
Total Protein: 6.1 g/dL (ref 6.0–8.3)

## 2014-11-16 LAB — LIPID PANEL
Cholesterol: 189 mg/dL (ref 0–200)
HDL: 38.9 mg/dL — AB (ref >39.00)
NonHDL: 150.1
Total CHOL/HDL Ratio: 5
Triglycerides: 306 mg/dL — ABNORMAL HIGH (ref 0.0–149.0)
VLDL: 61.2 mg/dL — ABNORMAL HIGH (ref 0.0–40.0)

## 2014-11-16 LAB — MICROALBUMIN / CREATININE URINE RATIO
CREATININE, U: 143.6 mg/dL
Microalb Creat Ratio: 169.7 mg/g — ABNORMAL HIGH (ref 0.0–30.0)
Microalb, Ur: 243.7 mg/dL — ABNORMAL HIGH (ref 0.0–1.9)

## 2014-11-16 LAB — LDL CHOLESTEROL, DIRECT: Direct LDL: 116 mg/dL

## 2014-11-16 LAB — HEMOGLOBIN A1C: Hgb A1c MFr Bld: 9.7 % — ABNORMAL HIGH (ref 4.6–6.5)

## 2014-11-16 MED ORDER — CYANOCOBALAMIN 1000 MCG/ML IJ SOLN
1000.0000 ug | Freq: Once | INTRAMUSCULAR | Status: AC
  Administered 2014-11-16: 1000 ug via INTRAMUSCULAR

## 2014-11-16 NOTE — Patient Instructions (Signed)
Labs today.  Follow up in 3 months or sooner as needed. 

## 2014-11-16 NOTE — Progress Notes (Signed)
Subjective:    Patient ID: Steve Phillips, male    DOB: 05-27-1946, 68 y.o.   MRN: [id number]  HPI 68YO male presents for follow up.  Last seen 12/22/ for severe leg pain. Sent to ED as oral narcotics not helping. Started on Prednisone with some improvement in pain symptoms. [place] of leg showed no clot.  Finished prednisone yesterday. Pain has resolved.  DM - BG have been high, with most readings below 300, per his verbal report. Did not bring record today. Started on Toujeo in place of Lantus.  No new concerns today.   Past medical, surgical, family and social history per today's encounter.  Review of Systems  Constitutional: Negative for fever, chills, activity change, appetite change, fatigue and unexpected weight change.  Eyes: Negative for visual disturbance.  Respiratory: Negative for cough and shortness of breath.   Cardiovascular: Negative for chest pain, palpitations and leg swelling.  Gastrointestinal: Negative for nausea, vomiting, abdominal pain, diarrhea, constipation and abdominal distention.  Genitourinary: Negative for dysuria, urgency and difficulty urinating.  Musculoskeletal: Positive for myalgias, arthralgias and gait problem.  Skin: Negative for color change and rash.  Neurological: Positive for weakness and numbness.  Hematological: Negative for adenopathy.  Psychiatric/Behavioral: Negative for sleep disturbance and dysphoric mood. The patient is not nervous/anxious.        Objective:    BP 144/80 mmHg  Pulse 61  Temp(Src) 97.6 F (36.4 C) (Oral)  Ht 6' (1.829 m)  Wt 257 lb 12 oz (116.915 kg)  BMI 34.95 kg/m2  SpO2 61% Physical Exam  Constitutional: He is oriented to person, place, and time. He appears well-developed and well-nourished. No distress.  HENT:  Head: Normocephalic and atraumatic.  Right Ear: External ear normal.  Left Ear: External ear normal.  Nose: Nose normal.  Mouth/Throat: Oropharynx is clear and moist. No oropharyngeal exudate.    Eyes: Conjunctivae and EOM are normal. Pupils are equal, round, and reactive to light. Right eye exhibits no discharge. Left eye exhibits no discharge. No scleral icterus.  Neck: Normal range of motion. Neck supple. No tracheal deviation present. No thyromegaly present.  Cardiovascular: Normal rate, regular rhythm and normal heart sounds.  Exam reveals no gallop and no friction rub.   No murmur heard. Pulmonary/Chest: Effort normal and breath sounds normal. No accessory muscle usage. No tachypnea. No respiratory distress. He has no decreased breath sounds. He has no wheezes. He has no rhonchi. He has no rales. He exhibits no tenderness.  Musculoskeletal: Normal range of motion. He exhibits no edema.  Lymphadenopathy:    He has no cervical adenopathy.  Neurological: He is alert and oriented to person, place, and time. No cranial nerve deficit. Coordination abnormal.  Skin: Skin is warm and dry. No rash noted. He is not diaphoretic. No erythema. No pallor.  Psychiatric: He has a normal mood and affect. His behavior is normal. Judgment and thought content normal.          Assessment & Plan:   Problem List Items Addressed This Visit      High   DM (diabetes mellitus), type 2 with neurological complications - Primary    Will check A1c with labs today. Follow up with Dr. Cruzita Lederer on 12/31    Relevant Orders      Comprehensive metabolic panel      Hemoglobin A1c      Lipid panel      Microalbumin / creatinine urine ratio   Hypertension    BP Readings from  Last 3 Encounters:  11/16/14 144/80  11/09/14 179/78  10/07/14 142/78   BP well controlled generally on current medication. Renal function with labs today.      Unprioritized   Elevated PSA, less than 10 ng/ml    Pt reports follow up with urology pending for mild elevation of PSA.    Right leg pain    Likely related to neuropathy. Symptoms improved with prednisone taper. Will continue to monitor.        Return in about 3  months (around 02/15/2015) for Recheck of Diabetes.

## 2014-11-16 NOTE — Assessment & Plan Note (Signed)
Will check A1c with labs today. Follow up with Dr. Cruzita Lederer on 12/31

## 2014-11-16 NOTE — Assessment & Plan Note (Signed)
Likely related to neuropathy. Symptoms improved with prednisone taper. Will continue to monitor.

## 2014-11-16 NOTE — Addendum Note (Signed)
Addended by: Vernetta Honey on: 11/16/2014 03:47 PM   Modules accepted: Orders

## 2014-11-16 NOTE — Progress Notes (Signed)
Pre visit review using our clinic review tool, if applicable. No additional management support is needed unless otherwise documented below in the visit note. 

## 2014-11-16 NOTE — Assessment & Plan Note (Signed)
BP Readings from Last 3 Encounters:  11/16/14 144/80  11/09/14 179/78  10/07/14 142/78   BP well controlled generally on current medication. Renal function with labs today.

## 2014-11-16 NOTE — Assessment & Plan Note (Signed)
Pt reports follow up with urology pending for mild elevation of PSA.

## 2014-11-17 ENCOUNTER — Telehealth: Payer: Self-pay | Admitting: *Deleted

## 2014-11-17 MED ORDER — PREDNISONE 10 MG PO TABS: ORAL_TABLET | ORAL | Status: DC

## 2014-11-17 NOTE — Telephone Encounter (Signed)
Pt's legs and back were hurting all night.Pt states that he decided he would like to get the Rx for prednisone as you and him talked about during his visit yesterday.

## 2014-11-17 NOTE — Telephone Encounter (Signed)
Notified pt. 

## 2014-11-17 NOTE — Telephone Encounter (Signed)
I have sent in Rx for prednisone. He must set up follow up with neurology given severe, poorly controlled neuropathy

## 2014-11-18 ENCOUNTER — Ambulatory Visit (INDEPENDENT_AMBULATORY_CARE_PROVIDER_SITE_OTHER): Payer: Medicare Other | Admitting: Internal Medicine

## 2014-11-18 ENCOUNTER — Encounter: Payer: Self-pay | Admitting: Internal Medicine

## 2014-11-18 VITALS — BP 122/62 | HR 77 | Temp 97.9°F | Resp 12 | Wt 252.0 lb

## 2014-11-18 DIAGNOSIS — I639 Cerebral infarction, unspecified: Secondary | ICD-10-CM | POA: Diagnosis not present

## 2014-11-18 DIAGNOSIS — E1140 Type 2 diabetes mellitus with diabetic neuropathy, unspecified: Secondary | ICD-10-CM

## 2014-11-18 DIAGNOSIS — E1149 Type 2 diabetes mellitus with other diabetic neurological complication: Secondary | ICD-10-CM

## 2014-11-18 NOTE — Patient Instructions (Signed)
Please continue: - Toujeo 70 units 2x a day Increase: - NovoLog to 50 units with a regular mea, 55 with a larger meal  Continue: - NovoLog Sliding Scale:  - 150- 165: + 1 unit  - 166- 180: + 2 units  - 181- 195: + 3 units  - 196- 210: + 4 units  - 211- 225: + 5 units  - > 225: + 6 units  Please stop eating between meals!   Try to replace snacking on these with drinking/eating: * soy or almond milk * veggies with humus or other low calorie/low fat dip * low glycemic index fruits (higher glycemic index = higher risk to increase your sugars):          [email or web address] * fruit/veggie smoothies          Ninja blender recipes:          [email or web address]-west.com/ * unsalted nuts Etc.   Please return in 1.5 month with your sugar log.

## 2014-11-18 NOTE — Progress Notes (Signed)
Patient ID: Steve Phillips, male   DOB: July 04, 1946, 68 y.o.   MRN: [id number]  HPI: Steve Phillips is a 68 y.o.-year-old male, returning for f/u for DM2, dx 1990's, insulin-dependent, uncontrolled, with complications (CAD, PN, CKD). Last visit 1.5 mo ago. He is here with his wife who offers part of the hx.  He has sciatica >> on Prednisone 60 >> 50 today , tapering down. He is more hungry >> can eat a whole meal at bedtime. He will see pain medicine.  Last hemoglobin A1c was: Lab Results  Component Value Date   HGBA1C 9.7* 11/16/2014   HGBA1C 9.9* 08/18/2014   HGBA1C 10.1* 05/14/2014   Pt was on: Metformin 1000 mg 2x a day.  Lantus 70 units at bedtime.  NovoLog as follows:  - 25 >> 30 >> 35 >> 40 units for a smaller meal  - 30 >> 35 >> 38 >> 43 units for a regular meal  - 33 >> 38 >> 40 >> 46 units for a large meal - most of the time  He sometimes takes 51 units. NovoLog Sliding Scale:  - 150- 165: + 1 unit  - 166- 180: + 2 units  - 181- 195: + 3 units  - 196- 210: + 4 units  - 211- 225: + 5 units  - > 225: + 6 units   At last visit, we changed to U500 insulin: 10 units before a small meal 15 units before a regular meal 20 units before a large meal or if you have dessert For smaller meal, injects only 0.10 mL (10 units on the syringe) or even 0.05 mL (5 units on the syringe)  He did not like the U500 >> nausea, now taking: - Toujeo 70 units 2x a day - NovoLog: 46 units with a regular meal, sometimes 48 units - NovoLog Sliding Scale:  - 150- 165: + 1 unit  - 166- 180: + 2 units  - 181- 195: + 3 units  - 196- 210: + 4 units  - 211- 225: + 5 units  - > 225: + 6 units   Pt checks his sugars 2-3x a day and they are - brings log - higher on Prednisone: - am: 90-150 >> 127-217 >> 98-150s, but some 200-276 ~ eating at night >> 102-242 - 2h after b'fast: 220-312 >> 191-301 >> forgot >> 216-289 >> n/c >> 260-318 - before lunch: 200-231 >> 211-227 >> forgot >> 237 >> 165-231 >>  204-244 - 2h after lunch: n/c  >> 262 >> 302-403 >> 271, 287 >> forgot >> 313 >> 126 >> 251, 356 - before dinner: 161 >> forgot >> 121, 216-246 >> 58 -185 >> 154 - 2h after dinner: 197-337 >> forgot >> 199, 235, 284 >> 145-158, 299x1 >> 201-330 - bedtime: 256-473 >> 200-301 >> forgot >> 256, 307 >> 54x1, 81-119 >> 167-257 + lows - lowest 53; he has hypoglycemia awareness at 70.  Highest sugar was 300.  Pt's meals are - but he eats constantly! - Breakfast: 2 eggs, sausage, toast, coffee - Lunch: sandwich or leftovers - Dinner: meat + veggies + bread, tea or milk - Snacks: pm and late at night  - + mild CKD, last BUN/creatinine:  Lab Results  Component Value Date   BUN 20 11/16/2014   CREATININE 1.0 11/16/2014  On Lisinopril.  - last set of lipids: Lab Results  Component Value Date   CHOL 189 11/16/2014   HDL 38.90* 11/16/2014   [place]  25 05/14/2014   LDLDIRECT 116.0 11/16/2014   TRIG 306.0* 11/16/2014   CHOLHDL 5 11/16/2014  On Crestor. - last eye exam was 01/28/2014 ([name]). No DR.  - + no sensation in legs below knee. He sees his podiatrist q 3 mo ([name]). Foot exam performed 04/2014.  I reviewed pt's medications, allergies, PMH, social hx, family hx, and changes were documented in the history of present illness. Otherwise, unchanged from my initial visit note.  He had a stroke and Bells' palsy in 05/2014 >> in the hospital >> started on steroids 50 mg x 4 days, then tapering >> sugars higher after this.. His strength in L arm and leg is much improved.  ROS: Constitutional: no weight gain/loss, + increased appetite, no fatigue, no subjective hypo/hyperthermia Eyes:no blurry vision, no xerophthalmia ENT: no sore throat, no nodules palpated in throat, no dysphagia/odynophagia, no hoarseness, + hypoacusis Cardiovascular: no CP/no SOB/palpitations/+ leg swelling Respiratory: no cough/SOB Gastrointestinal: no N/V/D/C Musculoskeletal: no muscle aches/no  joint aches/+ back pain Skin: no rashes Neurological: no tremors/+ numbness - up to knees/tingling/dizziness  PE: BP 122/62 mmHg  Pulse 77  Temp(Src) 97.9 F (36.6 C) (Oral)  Resp 12  Wt 252 lb (114.306 kg)  SpO2 97% Wt Readings from Last 3 Encounters:  11/18/14 252 lb (114.306 kg)  11/16/14 257 lb 12 oz (116.915 kg)  11/09/14 253 lb 8 oz (114.987 kg)   Constitutional: overweight, in NAD, walks with cane Eyes: PERRLA, EOMI, no exophthalmos ENT: moist mucous membranes, no thyromegaly, no cervical lymphadenopathy Cardiovascular: RRR, No MRG, + leg swelling bilaterally - pitting Respiratory: CTA B Gastrointestinal: abdomen soft, NT, ND, BS+ Musculoskeletal: no deformities Skin: moist, warm, no rashes  ASSESSMENT: 1. DM2, insulin-dependent, uncontrolled, with complications - CAD, s/p multiple PCI with stent RCA,LAD and obtuse marginal, followed @ [facility] on the accord study.., cath 02/2012 90% D1, s/p drug-eluting stent - Dr. Fletcher [name] - Peripheral neuropathy - CKD, mild  He was also in the Accord Study for 8 years.   PLAN:  1. Patient with long-standing, uncontrolled, diabetes, now back on basal- bolus regimen  - could not tolerate U500! He is eating incessantly >> strongly advised to stop. Will increase NovoLog a little, but his diabetes control depends strongly on his eating habits. - I suggested to:  Patient Instructions  Please continue: - Toujeo 70 units 2x a day Increase: - NovoLog to 50 units with a regular mea, 55 with a larger meal  Continue: - NovoLog Sliding Scale:  - 150- 165: + 1 unit  - 166- 180: + 2 units  - 181- 195: + 3 units  - 196- 210: + 4 units  - 211- 225: + 5 units  - > 225: + 6 units  Please stop eating between meals!   Try to replace snacking on these with drinking/eating: * soy or almond milk * veggies with humus or other low calorie/low fat dip * low glycemic index fruits (higher glycemic index = higher risk to increase your sugars):           [email or web address] * fruit/veggie smoothies          Ninja blender recipes:          https://www.moore-west.com/ * unsalted nuts Etc.   Please return in 1.5 month with your sugar log.   - he will continue Metformin - given new sugar logs - he had flu shot this season - continue checking sugars at different times of the day - check  3 times a day, rotating checks - up to date with eye exams and foot exams - RTC in 1.5 mo  - time spent with the patient: 25 min, of which >50% was spent in reviewing his sugar log (he keeps great records of his sugars), discussing his hyper-glycemic episodes, reviewing  previous labs and insulin doses and developing a plan to avoid hypo- and hyper-glycemia. Also, discussed at length about healthy eating habits.

## 2014-11-24 DIAGNOSIS — M25551 Pain in right hip: Secondary | ICD-10-CM | POA: Diagnosis not present

## 2014-11-24 DIAGNOSIS — M5416 Radiculopathy, lumbar region: Secondary | ICD-10-CM | POA: Diagnosis not present

## 2014-11-24 DIAGNOSIS — M1611 Unilateral primary osteoarthritis, right hip: Secondary | ICD-10-CM | POA: Diagnosis not present

## 2014-11-25 DIAGNOSIS — M1611 Unilateral primary osteoarthritis, right hip: Secondary | ICD-10-CM | POA: Diagnosis not present

## 2014-12-01 DIAGNOSIS — L570 Actinic keratosis: Secondary | ICD-10-CM | POA: Diagnosis not present

## 2014-12-03 DIAGNOSIS — M7541 Impingement syndrome of right shoulder: Secondary | ICD-10-CM | POA: Diagnosis not present

## 2014-12-03 DIAGNOSIS — M1611 Unilateral primary osteoarthritis, right hip: Secondary | ICD-10-CM | POA: Diagnosis not present

## 2014-12-30 ENCOUNTER — Ambulatory Visit: Payer: Medicare Other | Admitting: Internal Medicine

## 2014-12-31 ENCOUNTER — Ambulatory Visit (INDEPENDENT_AMBULATORY_CARE_PROVIDER_SITE_OTHER): Payer: Medicare Other | Admitting: Internal Medicine

## 2014-12-31 ENCOUNTER — Encounter: Payer: Self-pay | Admitting: Internal Medicine

## 2014-12-31 VITALS — BP 148/84 | HR 78 | Temp 97.5°F | Ht 72.0 in | Wt 261.2 lb

## 2014-12-31 DIAGNOSIS — E1140 Type 2 diabetes mellitus with diabetic neuropathy, unspecified: Secondary | ICD-10-CM

## 2014-12-31 DIAGNOSIS — E1149 Type 2 diabetes mellitus with other diabetic neurological complication: Secondary | ICD-10-CM

## 2014-12-31 MED ORDER — INSULIN ASPART 100 UNIT/ML ~~LOC~~ SOLN: SUBCUTANEOUS | Status: DC

## 2014-12-31 MED ORDER — METFORMIN HCL 1000 MG PO TABS: ORAL_TABLET | ORAL | Status: DC

## 2014-12-31 MED ORDER — INSULIN GLARGINE 300 UNIT/ML ~~LOC~~ SOPN: 55.0000 [IU] | PEN_INJECTOR | Freq: Two times a day (BID) | SUBCUTANEOUS | Status: DC

## 2014-12-31 NOTE — Progress Notes (Signed)
Pre visit review using our clinic review tool, if applicable. No additional management support is needed unless otherwise documented below in the visit note. 

## 2014-12-31 NOTE — Progress Notes (Signed)
Patient ID: Steve Phillips, male   DOB: 10-28-1946, 69 y.o.   MRN: [id number]  HPI: Steve Phillips is a 69 y.o.-year-old male, returning for f/u for DM2, dx 1990's, insulin-dependent, uncontrolled, with complications (CAD, PN, CKD). Last visit 1.5 mo ago. He is here with his wife who offers part of the hx.  Last hemoglobin A1c was: Lab Results  Component Value Date   HGBA1C 9.7* 11/16/2014   HGBA1C 9.9* 08/18/2014   HGBA1C 10.1* 05/14/2014  Was on Prednisone, then had a hip and a shoulder Kenalog injection.  We tried U500 insulin: 10 units before a small meal 15 units before a regular meal 20 units before a large meal or if you have dessert For smaller meal, injects only 0.10 mL (10 units on the syringe) or even 0.05 mL (5 units on the syringe)  He did not like the U500 >> nausea, now taking: - Toujeo 40-70 units 2x a day - NovoLog: 40 units with a smaller meal, 48-50 units with a large meal - NovoLog Sliding Scale:  - 150- 165: + 1 unit  - 166- 180: + 2 units  - 181- 195: + 3 units  - 196- 210: + 4 units  - 211- 225: + 5 units  - > 225: + 6 units   Pt checks his sugars 2-3x a day and they are - brings log - higher on steroids: - am: 90-150 >> 127-217 >> 98-150s, but some 200-276 ~ eating at night >> 102-242 >> 69-145, 186 - 2h after b'fast: 220-312 >> 191-301 >> forgot >> 216-289 >> n/c >> 260-318 >> 119, 183-250 - before lunch: 200-231 >> 211-227 >> forgot >> 237 >> 165-231 >> 204-244 >> n/c - 2h after lunch: n/c  >> 262 >> 302-403 >> 271, 287 >> forgot >> 313 >> 126 >> 251, 356 >> 49x1 (took insulin w/o eating), 73-248 - before dinner: 161 >> forgot >> 121, 216-246 >> 58 -185 >> 154 >> 119, 238 - 2h after dinner: 197-337 >> forgot >> 199, 235, 284 >> 145-158, 299x1 >> 201-330 >> 109-273 - bedtime: 256-473 >> 200-301 >> forgot >> 256, 307 >> 54x1, 81-119 >> 167-257 >> 104-298 + lows - lowest 53; he has hypoglycemia awareness at 70.  Highest sugar was 297  Pt's meals are - but he  eats constantly! - Breakfast: 2 eggs, sausage, toast, coffee - Lunch: sandwich or leftovers - Dinner: meat + veggies + bread, tea or milk - Snacks: pm and late at night  - + mild CKD, last BUN/creatinine:  Lab Results  Component Value Date   BUN 20 11/16/2014   CREATININE 1.0 11/16/2014  On Lisinopril.  - last set of lipids: Lab Results  Component Value Date   CHOL 189 11/16/2014   HDL 38.90* 11/16/2014   LDLCALC 25 05/14/2014   LDLDIRECT 116.0 11/16/2014   TRIG 306.0* 11/16/2014   CHOLHDL 5 11/16/2014  On Crestor. - last eye exam was 01/28/2014 ([name]). No DR.  - + no sensation in legs below knee. He sees his podiatrist q 3 mo ([name]). Foot exam performed 04/2014.  I reviewed pt's medications, allergies, PMH, social hx, family hx, and changes were documented in the history of present illness. Otherwise, unchanged from my initial visit note.  He had a stroke and Bells' palsy in 05/2014 >> in the hospital >> started on steroids 50 mg x 4 days, then tapering >> sugars higher after this.. His strength in L arm  and leg is much improved.  ROS: Constitutional: no weight gain/loss, no increased appetite, no fatigue, no subjective hypo/hyperthermia Eyes:no blurry vision, no xerophthalmia ENT: no sore throat, no nodules palpated in throat, no dysphagia/odynophagia, no hoarseness, + hypoacusis Cardiovascular: no CP/no SOB/palpitations/leg swelling Respiratory: no cough/SOB Gastrointestinal: no N/V/D/C Musculoskeletal: no muscle aches/no joint aches/back pain Skin: no rashes Neurological: no tremors/+ numbness - up to knees/tingling/dizziness  PE: BP 148/84 mmHg  Pulse 78  Temp(Src) 97.5 F (36.4 C) (Oral)  Ht 6' (1.829 m)  Wt 261 lb 4 oz (118.502 kg)  BMI 35.42 kg/m2  SpO2 96% Wt Readings from Last 3 Encounters:  12/31/14 261 lb 4 oz (118.502 kg)  11/18/14 252 lb (114.306 kg)  11/16/14 257 lb 12 oz (116.915 kg)   Constitutional: overweight, in NAD,  walks with cane Eyes: PERRLA, EOMI, no exophthalmos ENT: moist mucous membranes, no thyromegaly, no cervical lymphadenopathy Cardiovascular: RRR, No MRG, + leg swelling bilaterally - pitting Respiratory: CTA B Gastrointestinal: abdomen soft, NT, ND, BS+ Musculoskeletal: no deformities Skin: moist, warm, no rashes  ASSESSMENT: 1. DM2, insulin-dependent, uncontrolled, with complications - CAD, s/p multiple PCI with stent RCA,LAD and obtuse marginal, followed @ [facility] on the accord study.., cath 02/2012 90% D1, s/p drug-eluting stent - Dr. Fletcher [name] - Peripheral neuropathy - CKD, mild  He was also in the Accord Study for 8 years.   PLAN:  1. Patient with long-standing, uncontrolled, diabetes, now back on basal- bolus regimen  - could not tolerate U500. We switched to [facility] and he also started to exercise daily >> he has some lows, but overall sugars are still up to 200s after meals. He has been taking less insulin >> will increase the mealtime insulin and decrease Toujeo. Stopping steroids will also help. - I suggested to:  Patient Instructions  Please decrease Toujeo to 55 units 2x a day. Please take NovoLog as follows: - 45 units with a smaller meal, 50 units with a large meal - NovoLog Sliding Scale:  - 150- 165: + 1 unit  - 166- 180: + 2 units  - 181- 195: + 3 units  - 196- 210: + 4 units  - 211- 225: + 5 units  - > 225: + 6 units   Please return in 1.5 month with your sugar log.   - he will continue Metformin >> refilled - given new sugar logs - he had flu shot this season - continue checking sugars at different times of the day - check 3 times a day, rotating checks - up to date with eye exams and foot exams - RTC in 1.5 mo

## 2014-12-31 NOTE — Patient Instructions (Signed)
Please decrease Toujeo to 55 units 2x a day. Please take NovoLog as follows: - 45 units with a smaller meal, 50 units with a large meal - NovoLog Sliding Scale:  - 150- 165: + 1 unit  - 166- 180: + 2 units  - 181- 195: + 3 units  - 196- 210: + 4 units  - 211- 225: + 5 units  - > 225: + 6 units   Please return in 1.5 month with your sugar log.

## 2015-01-21 ENCOUNTER — Other Ambulatory Visit: Payer: Self-pay | Admitting: Internal Medicine

## 2015-01-27 DIAGNOSIS — H6063 Unspecified chronic otitis externa, bilateral: Secondary | ICD-10-CM | POA: Diagnosis not present

## 2015-01-27 DIAGNOSIS — H6123 Impacted cerumen, bilateral: Secondary | ICD-10-CM | POA: Diagnosis not present

## 2015-01-27 DIAGNOSIS — H60331 Swimmer's ear, right ear: Secondary | ICD-10-CM | POA: Diagnosis not present

## 2015-01-28 ENCOUNTER — Telehealth: Payer: Self-pay | Admitting: Internal Medicine

## 2015-01-28 MED ORDER — INSULIN GLARGINE 300 UNIT/ML ~~LOC~~ SOPN: 55.0000 [IU] | PEN_INJECTOR | Freq: Two times a day (BID) | SUBCUTANEOUS | Status: DC

## 2015-01-28 NOTE — Telephone Encounter (Signed)
Patient need refill of Toujeo 60 unit 2 x a day, any questions she said call her.

## 2015-01-28 NOTE — Telephone Encounter (Signed)
Dr Cruzita Lederer decreased Toujeo to 55 units 2 x a day at last office visit. Rx refill sent to pt's pharmacy.

## 2015-02-11 ENCOUNTER — Ambulatory Visit: Payer: Medicare Other | Admitting: Internal Medicine

## 2015-02-16 ENCOUNTER — Other Ambulatory Visit (INDEPENDENT_AMBULATORY_CARE_PROVIDER_SITE_OTHER): Payer: Medicare Other

## 2015-02-16 ENCOUNTER — Other Ambulatory Visit: Payer: Self-pay | Admitting: *Deleted

## 2015-02-16 DIAGNOSIS — E1140 Type 2 diabetes mellitus with diabetic neuropathy, unspecified: Secondary | ICD-10-CM | POA: Diagnosis not present

## 2015-02-16 DIAGNOSIS — E118 Type 2 diabetes mellitus with unspecified complications: Secondary | ICD-10-CM

## 2015-02-16 DIAGNOSIS — E119 Type 2 diabetes mellitus without complications: Secondary | ICD-10-CM

## 2015-02-16 DIAGNOSIS — E1149 Type 2 diabetes mellitus with other diabetic neurological complication: Secondary | ICD-10-CM

## 2015-02-16 LAB — COMPREHENSIVE METABOLIC PANEL
ALT: 7 U/L (ref 0–53)
AST: 19 U/L (ref 0–37)
Albumin: 3.5 g/dL (ref 3.5–5.2)
Alkaline Phosphatase: 67 U/L (ref 39–117)
BUN: 12 mg/dL (ref 6–23)
CO2: 32 mEq/L (ref 19–32)
CREATININE: 1.12 mg/dL (ref 0.40–1.50)
Calcium: 9.1 mg/dL (ref 8.4–10.5)
Chloride: 99 mEq/L (ref 96–112)
GFR: 68.77 mL/min (ref >60.00)
Glucose, Bld: 282 mg/dL — ABNORMAL HIGH (ref 70–99)
POTASSIUM: 3.8 meq/L (ref 3.5–5.1)
Sodium: 136 mEq/L (ref 135–145)
Total Bilirubin: 0.4 mg/dL (ref 0.2–1.2)
Total Protein: 6.3 g/dL (ref 6.0–8.3)

## 2015-02-16 LAB — HEMOGLOBIN A1C: HEMOGLOBIN A1C: 9.1 % — AB (ref 4.6–6.5)

## 2015-02-16 MED ORDER — DULOXETINE HCL 60 MG PO CPEP: ORAL_CAPSULE | ORAL | Status: DC

## 2015-02-16 MED ORDER — AMLODIPINE BESYLATE 5 MG PO TABS: 5.0000 mg | ORAL_TABLET | Freq: Every day | ORAL | Status: DC

## 2015-02-16 MED ORDER — CARBIDOPA-LEVODOPA 25-100 MG PO TABS: 2.0000 | ORAL_TABLET | Freq: Three times a day (TID) | ORAL | Status: DC

## 2015-02-16 MED ORDER — CLOPIDOGREL BISULFATE 75 MG PO TABS: 75.0000 mg | ORAL_TABLET | Freq: Every day | ORAL | Status: DC

## 2015-02-20 ENCOUNTER — Emergency Department
Admit: 2015-02-20 | Disposition: A | Discharge status: Home / Self Care | Payer: Self-pay | Admitting: Emergency Medicine

## 2015-02-20 DIAGNOSIS — R079 Chest pain, unspecified: Secondary | ICD-10-CM | POA: Diagnosis not present

## 2015-02-20 DIAGNOSIS — R0602 Shortness of breath: Secondary | ICD-10-CM | POA: Diagnosis not present

## 2015-02-20 DIAGNOSIS — E876 Hypokalemia: Secondary | ICD-10-CM | POA: Diagnosis not present

## 2015-02-20 DIAGNOSIS — R0789 Other chest pain: Secondary | ICD-10-CM | POA: Diagnosis not present

## 2015-02-20 DIAGNOSIS — E119 Type 2 diabetes mellitus without complications: Secondary | ICD-10-CM | POA: Diagnosis not present

## 2015-02-20 DIAGNOSIS — I10 Essential (primary) hypertension: Secondary | ICD-10-CM | POA: Diagnosis not present

## 2015-02-20 DIAGNOSIS — R05 Cough: Secondary | ICD-10-CM | POA: Diagnosis not present

## 2015-02-20 DIAGNOSIS — Z87891 Personal history of nicotine dependence: Secondary | ICD-10-CM | POA: Diagnosis not present

## 2015-02-20 LAB — CBC
HCT: 43.9 % (ref 40.0–52.0)
HGB: 14.4 g/dL (ref 13.0–18.0)
MCH: 24.3 pg — ABNORMAL LOW (ref 26.0–34.0)
MCHC: 32.8 g/dL (ref 32.0–36.0)
MCV: 74 fL — AB (ref 80–100)
Platelet: 235 10*3/uL (ref 150–440)
RBC: 5.93 10*6/uL — AB (ref 4.40–5.90)
RDW: 17.1 % — ABNORMAL HIGH (ref 11.5–14.5)
WBC: 14.8 10*3/uL — ABNORMAL HIGH (ref 3.8–10.6)

## 2015-02-20 LAB — BASIC METABOLIC PANEL
ANION GAP: 9 (ref 7–16)
BUN: 9 mg/dL
Calcium, Total: 8.2 mg/dL — ABNORMAL LOW
Chloride: 102 mmol/L
Co2: 26 mmol/L
Creatinine: 0.96 mg/dL
EGFR (African American): >60
EGFR (Non-African Amer.): >60
Glucose: 243 mg/dL — ABNORMAL HIGH
Potassium: 3.2 mmol/L — ABNORMAL LOW
Sodium: 137 mmol/L

## 2015-02-20 LAB — TROPONIN I
Troponin-I: <0.03 ng/mL
Troponin-I: <0.03 ng/mL

## 2015-02-20 LAB — PRO B NATRIURETIC PEPTIDE: B-Type Natriuretic Peptide: 54 pg/mL

## 2015-02-20 LAB — PROTIME-INR
INR: 0.9
Prothrombin Time: 12.8 secs

## 2015-02-21 ENCOUNTER — Telehealth: Payer: Self-pay

## 2015-02-21 NOTE — Telephone Encounter (Signed)
He is overdue for his 6 month f/u w/ Dr. Fletcher [name]. He should probably be seen either way.  Can we call him to set something up?

## 2015-02-21 NOTE — Telephone Encounter (Signed)
Pt wife called, states pt was in ED last night and needs a f/u for CP, and nausea. Please advise if pt needs appt.

## 2015-02-22 ENCOUNTER — Encounter: Payer: Self-pay | Admitting: Cardiovascular Disease

## 2015-02-22 ENCOUNTER — Ambulatory Visit (INDEPENDENT_AMBULATORY_CARE_PROVIDER_SITE_OTHER): Payer: Medicare Other | Admitting: Cardiovascular Disease

## 2015-02-22 ENCOUNTER — Ambulatory Visit (INDEPENDENT_AMBULATORY_CARE_PROVIDER_SITE_OTHER): Payer: Medicare Other | Admitting: Internal Medicine

## 2015-02-22 ENCOUNTER — Encounter: Payer: Self-pay | Admitting: Internal Medicine

## 2015-02-22 VITALS — BP 168/90 | HR 58 | Ht 72.0 in | Wt 259.5 lb

## 2015-02-22 VITALS — BP 170/76 | HR 71 | Temp 97.8°F | Ht 72.0 in | Wt 260.4 lb

## 2015-02-22 DIAGNOSIS — E1140 Type 2 diabetes mellitus with diabetic neuropathy, unspecified: Secondary | ICD-10-CM | POA: Diagnosis not present

## 2015-02-22 DIAGNOSIS — R5382 Chronic fatigue, unspecified: Secondary | ICD-10-CM | POA: Diagnosis not present

## 2015-02-22 DIAGNOSIS — I25110 Atherosclerotic heart disease of native coronary artery with unstable angina pectoris: Secondary | ICD-10-CM

## 2015-02-22 DIAGNOSIS — R079 Chest pain, unspecified: Secondary | ICD-10-CM | POA: Diagnosis not present

## 2015-02-22 DIAGNOSIS — I25119 Atherosclerotic heart disease of native coronary artery with unspecified angina pectoris: Secondary | ICD-10-CM | POA: Diagnosis not present

## 2015-02-22 DIAGNOSIS — E785 Hyperlipidemia, unspecified: Secondary | ICD-10-CM

## 2015-02-22 DIAGNOSIS — E1149 Type 2 diabetes mellitus with other diabetic neurological complication: Secondary | ICD-10-CM

## 2015-02-22 DIAGNOSIS — R9431 Abnormal electrocardiogram [ECG] [EKG]: Secondary | ICD-10-CM | POA: Diagnosis not present

## 2015-02-22 DIAGNOSIS — M79604 Pain in right leg: Secondary | ICD-10-CM

## 2015-02-22 DIAGNOSIS — I10 Essential (primary) hypertension: Secondary | ICD-10-CM | POA: Diagnosis not present

## 2015-02-22 DIAGNOSIS — E538 Deficiency of other specified B group vitamins: Secondary | ICD-10-CM | POA: Diagnosis not present

## 2015-02-22 LAB — CBC WITH DIFFERENTIAL/PLATELET
BASOS ABS: 0.1 10*3/uL (ref 0.0–0.1)
Basophils Relative: 0.4 % (ref 0.0–3.0)
EOS PCT: 2.3 % (ref 0.0–5.0)
Eosinophils Absolute: 0.3 10*3/uL (ref 0.0–0.7)
HEMATOCRIT: 43.9 % (ref 39.0–52.0)
Hemoglobin: 14.7 g/dL (ref 13.0–17.0)
LYMPHS ABS: 2.1 10*3/uL (ref 0.7–4.0)
LYMPHS PCT: 17.3 % (ref 12.0–46.0)
MCHC: 33.5 g/dL (ref 30.0–36.0)
MCV: 73 fl — ABNORMAL LOW (ref 78.0–100.0)
MONOS PCT: 7.1 % (ref 3.0–12.0)
Monocytes Absolute: 0.9 10*3/uL (ref 0.1–1.0)
NEUTROS PCT: 72.9 % (ref 43.0–77.0)
Neutro Abs: 8.8 10*3/uL — ABNORMAL HIGH (ref 1.4–7.7)
PLATELETS: 241 10*3/uL (ref 150.0–400.0)
RBC: 6.01 Mil/uL — ABNORMAL HIGH (ref 4.22–5.81)
RDW: 17 % — AB (ref 11.5–15.5)
WBC: 12.1 10*3/uL — ABNORMAL HIGH (ref 4.0–10.5)

## 2015-02-22 LAB — TSH: TSH: 1.07 u[IU]/mL (ref 0.35–4.50)

## 2015-02-22 LAB — VITAMIN B12: VITAMIN B 12: 390 pg/mL (ref 211–911)

## 2015-02-22 MED ORDER — CYANOCOBALAMIN 1000 MCG/ML IJ SOLN
1000.0000 ug | Freq: Once | INTRAMUSCULAR | Status: AC
  Administered 2015-02-22: 1000 ug via INTRAMUSCULAR

## 2015-02-22 MED ORDER — HYDROCODONE-ACETAMINOPHEN 5-325 MG PO TABS: 1.0000 | ORAL_TABLET | Freq: Three times a day (TID) | ORAL | Status: DC | PRN

## 2015-02-22 NOTE — Progress Notes (Signed)
Subjective:    Patient ID: Steve Phillips, male    DOB: [birth year], 69 y.o.   MRN: [id number]  HPI  69YO male presents for follow up.  Chest tightness - off and on over last 3-4 weeks. Went to [facility] ED Sunday. Had evaluation which was normal. Recommended follow up with Cardiology today. Continues to have some tightness across, worsened with exertion. Also feels short of breath with exertion. Occasionally nauseous. Sunday took 2 NTG with no improvement. Had started back in gym, but stopped because of fatigue.  DM - BG have been up and down. Compliant with Toujeo 70unit twice daily. Had some lows in 40s, so skipped some doses. At times, taking only 50units. Has follow up with Dr. Cruzita Lederer pending.  Continues to have severe pain in bilateral legs c/w known diabetic neuropathy. Taking prn Hydrocodone with some improvement. Also taking Lyrica with some improvement. Having trouble affording Lyrica.  Past medical, surgical, family and social history per today's encounter.  Review of Systems  Constitutional: Positive for fatigue. Negative for fever, chills, activity change, appetite change and unexpected weight change.  Eyes: Negative for visual disturbance.  Respiratory: Positive for shortness of breath. Negative for cough.   Cardiovascular: Negative for chest pain, palpitations and leg swelling.  Gastrointestinal: Positive for nausea. Negative for vomiting, abdominal pain, diarrhea, constipation and abdominal distention.  Genitourinary: Negative for dysuria, urgency and difficulty urinating.  Musculoskeletal: Positive for myalgias and gait problem. Negative for arthralgias.  Skin: Negative for color change and rash.  Neurological: Positive for weakness.  Hematological: Negative for adenopathy.  Psychiatric/Behavioral: Negative for sleep disturbance and dysphoric mood. The patient is not nervous/anxious.        Objective:    BP 170/76 mmHg  Pulse 71  Temp(Src) 97.8 F (36.6 C) (Oral)   Ht 6' (1.829 m)  Wt 260 lb 6 oz (118.105 kg)  BMI 35.31 kg/m2  SpO2 97% Physical Exam  Constitutional: He is oriented to person, place, and time. He appears well-developed and well-nourished. No distress.  HENT:  Head: Normocephalic and atraumatic.  Right Ear: External ear normal.  Left Ear: External ear normal.  Nose: Nose normal.  Mouth/Throat: Oropharynx is clear and moist. No oropharyngeal exudate.  Eyes: Conjunctivae and EOM are normal. Pupils are equal, round, and reactive to light. Right eye exhibits no discharge. Left eye exhibits no discharge. No scleral icterus.  Neck: Normal range of motion. Neck supple. No tracheal deviation present. No thyromegaly present.  Cardiovascular: Normal rate, regular rhythm and normal heart sounds.  Exam reveals no gallop and no friction rub.   No murmur heard. Pulmonary/Chest: Effort normal and breath sounds normal. No accessory muscle usage. No tachypnea. No respiratory distress. He has no decreased breath sounds. He has no wheezes. He has no rhonchi. He has no rales. He exhibits no tenderness.  Musculoskeletal: Normal range of motion. He exhibits no edema.  Lymphadenopathy:    He has no cervical adenopathy.  Neurological: He is alert and oriented to person, place, and time. No cranial nerve deficit. Coordination normal.  Skin: Skin is warm and dry. No rash noted. He is not diaphoretic. No erythema. No pallor.  Psychiatric: He has a normal mood and affect. His behavior is normal. Judgment and thought content normal.          Assessment & Plan:   Problem List Items Addressed This Visit      High   CAD (coronary artery disease)    CAD with recent chest tightness  not improved with NTG. Multiple risk factors for recurrent CAD. Cardiology evaluation today. Question if he may need repeat cath.       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 Pt is compliant with insulin regimen,  but, per wife, binge eats high sugar foods. Encouraged better compliance with diet. Plan to repeat A1c in 3 months. Will also set up endocrine follow up.      Hypertension    BP Readings from Last 3 Encounters:  02/22/15 170/76  12/31/14 148/84  11/18/14 122/62   BP elevated today. Cardiology evaluation today for anginal symptoms. For now, will continue Lisinopril-HCTZ, Amlodipine, Carvedilol.        Unprioritized   B12 deficiency   Relevant Medications   cyanocobalamin ((VITAMIN B-12)) injection 1,000 mcg (Completed)   Right leg pain    Persistent severe leg pain r>l secondary to neuropathy. Will continue Lyrica and use Hydrocodone as needed only for severe pain.      Relevant Medications   HYDROcodone-acetaminophen (NORCO/VICODIN) 5-325 MG per tablet    Other Visit Diagnoses    Chronic fatigue        Relevant Orders    CBC with Differential/Platelet    B12    TSH        Return in about 4 weeks (around 03/22/2015) for Recheck.

## 2015-02-22 NOTE — Progress Notes (Signed)
HPI  This is a 69 year old male who is here today for followup visit on an urgent basis due to chest pain. He has known history of coronary artery disease status post multiple PCI in the past at [facility]. He presented in April of 2013 with chest pain. He underwent cardiac catheterization which showed patent stents. However, there was a 90% stenosis in the proximal first diagonal. There was 40% disease in the mid LAD and moderate disease in the left circumflex. He underwent an angioplasty and drug-eluting stent placement to the diagonal without complications.  he has known history of refractory hypertension. Renal artery duplex ultrasound showed no evidence of renal artery stenosis. He started having substernal chest tightness over the last 2 weeks which has worsened recently. This is similar to his previous angina. This is now happening at rest and sometimes it lasted for 30 minutes. It only responded partially to nitroglycerin on Sunday and he went to the emergency room at [facility]. ECG showed lateral T wave changes. Cardiac enzymes were unremarkable.  Allergies  Allergen Reactions  . Codeine Nausea Only    Derivatives Nausea.  . Tramadol     unknown  . Trazodone And Nefazodone Nausea Only     Current Outpatient Prescriptions on File Prior to Visit  Medication Sig Dispense Refill  . amLODipine (NORVASC) 5 MG tablet Take 1 tablet (5 mg total) by mouth daily. 90 tablet 3  . aspirin 81 MG tablet Take 81 mg by mouth 3 (three) times daily.    . carbidopa-levodopa (SINEMET IR) 25-100 MG per tablet Take 2 tablets by mouth 3 (three) times daily. 270 tablet 2  . carvedilol (COREG) 25 MG tablet TAKE ONE TABLET BY MOUTH TWICE DAILY 180 tablet 1  . clopidogrel (PLAVIX) 75 MG tablet Take 1 tablet (75 mg total) by mouth daily with breakfast. 90 tablet 1  . cyanocobalamin (,VITAMIN B-12,) 1000 MCG/ML injection Inject 1,000 mcg into the muscle once.    . DULoxetine (CYMBALTA) 60 MG capsule TAKE 1  CAPSULE DAILY 90 capsule 3  . glucose blood (FREESTYLE TEST STRIPS) test strip Use to check blood sugar up to three times daily 300 each 1  . HYDROcodone-acetaminophen (NORCO/VICODIN) 5-325 MG per tablet Take 1-2 tablets by mouth every 8 (eight) hours as needed. 90 tablet 0  . insulin aspart (NOVOLOG) 100 UNIT/ML injection Inject 48-50 units 3 times a day with meals as instructed. 140 mL 1  . Insulin Glargine (TOUJEO SOLOSTAR) 300 UNIT/ML SOPN Inject 55 Units into the skin 2 (two) times daily at 8 am and 10 pm. (Patient taking differently: Inject 70 Units into the skin 2 (two) times daily at 8 am and 10 pm. ) 27 pen 1  . lisinopril-hydrochlorothiazide (PRINZIDE,ZESTORETIC) 20-12.5 MG per tablet Take 1 tablet by mouth 2 (two) times daily. 180 tablet 1  . metFORMIN (GLUCOPHAGE) 1000 MG tablet TAKE ONE TABLET BY MOUTH TWICE DAILY 180 tablet 1  . nitroGLYCERIN (NITROSTAT) 0.4 MG SL tablet Place 1 tablet (0.4 mg total) under the tongue every 5 (five) minutes as needed for chest pain. 30 tablet 6  . pantoprazole (PROTONIX) 40 MG tablet Take 1 tablet (40 mg total) by mouth 2 (two) times daily. 180 tablet 3  . pregabalin (LYRICA) 150 MG capsule Take 1 capsule (150 mg total) by mouth 2 (two) times daily. 180 capsule 2  . tiZANidine (ZANAFLEX) 4 MG tablet TAKE ONE TABLET BY MOUTH EVERY 8 HOURS AS NEEDED FOR PAIN 63 tablet 0  No current facility-administered medications on file prior to visit.     Past Medical History  Diagnosis Date  . Shingles   . Diabetes mellitus   . Hyperlipidemia   . Broken leg     left...s/p pins  . Lower leg pain     chronic ,left leg  . Acute angina   . Depression   . CAD (coronary artery disease)     s/p multiple PCI with stent RCA,LAD and obtuse marginal,followed @ [facility] on the accord study.., cath 02/2012 90% D1, s/p drug-eluting stent Dr. Fletcher [name]  . Falls 09/2011  . Dizziness   . Shortness of breath   . Headache(784.0)   . Hypertension     dr Jerilynn Mages Audelia Acton     labauer in  [place]  . Stroke   . Bell's palsy   . Parkinson disease   . Neuropathy      Past Surgical History  Procedure Laterality Date  . Appendectomy    . Cholecystectomy    . Left leg surgery    . Left foot surgery    . Drainage port in left testicle    . Cardiac catheterization  02/2012  . Back surgery    . Anterior cervical decomp/discectomy fusion N/A 04/30/2013    Procedure: ANTERIOR CERVICAL DECOMPRESSION/DISCECTOMY FUSION 2 LEVELS;  Surgeon: Faythe Ghee, MD;  Location: [facility];  Service: Neurosurgery;  Laterality: N/A;  Cervical four-five, Cervical six-seven anterior cervical decompression fusion with trabecular metal cage and plate     Family History  Problem Relation Age of Onset  . Cancer Mother     BREAST  . Heart disease Mother     STENT  . Cancer Father     THROAT  . Heart disease Sister     STENT; HTN  . Heart attack Brother   . Hypertension Brother   . Hypertension Sister      History   Social History  . Marital Status: Married    Spouse Name: N/A  . Number of Children: N/A  . Years of Education: N/A   Occupational History  . Not on file.   Social History Main Topics  . Smoking status: Former Smoker    Quit date: 11/08/1971  . Smokeless tobacco: Never Used  . Alcohol Use: No  . Drug Use: No  . Sexual Activity: Not on file   Other Topics Concern  . Not on file   Social History Narrative      PHYSICAL EXAM   BP 168/90 mmHg  Pulse 58  Ht 6' (1.829 m)  Wt 259 lb 8 oz (117.708 kg)  BMI 35.19 kg/m2 Constitutional: He is oriented to person, place, and time. He appears well-developed and well-nourished. No distress.  HENT: No nasal discharge.  Head: Normocephalic and atraumatic.  Eyes: Pupils are equal and round. Right eye exhibits no discharge. Left eye exhibits no discharge.  Neck: Normal range of motion. Neck supple. No JVD present. No thyromegaly present.  Cardiovascular: Normal rate, regular rhythm, normal heart sounds and.  Exam reveals no gallop and no friction rub. No murmur heard.  Pulmonary/Chest: Effort normal and breath sounds normal. No stridor. No respiratory distress. He has no wheezes. He has no rales. He exhibits no tenderness.  Abdominal: Soft. Bowel sounds are normal. He exhibits no distension. There is no tenderness. There is no rebound and no guarding.  Musculoskeletal: Normal range of motion. He exhibits no edema and no tenderness.  Neurological: He is alert and oriented to  person, place, and time. Coordination normal.  Skin: Skin is warm and dry. No rash noted. He is not diaphoretic. No erythema. No pallor.  Psychiatric: He has a normal mood and affect. His behavior is normal. Judgment and thought content normal.     EKG: Sinus bradycardia with first-degree AV block. Lateral T wave changes suggestive of ischemia.    ASSESSMENT AND PLAN

## 2015-02-22 NOTE — Telephone Encounter (Signed)
S/w Crystal to set up [facility] on April 6 with Dr. Fletcher [name] for unstable angina.  Cath lab aware pt will be having labs in the am before Cath.  Sent to precert no one in office to precert when I sent.  Precert should get in am.

## 2015-02-22 NOTE — Assessment & Plan Note (Signed)
BP Readings from Last 3 Encounters:  02/22/15 170/76  12/31/14 148/84  11/18/14 122/62   BP elevated today. Cardiology evaluation today for anginal symptoms. For now, will continue Lisinopril-HCTZ, Amlodipine, Carvedilol.

## 2015-02-22 NOTE — Patient Instructions (Signed)
Follow up with Dr. Fletcher [name] today as scheduled.  Follow up here in 4 weeks.

## 2015-02-22 NOTE — Assessment & Plan Note (Signed)
The patient's symptoms are worrisome for unstable angina with rest pain. EKG with lateral T wave changes. He has known history of coronary artery disease with previous PCI and multiple risk factors. Due to that, I recommend proceeding with urgent cardiac catheterization and possible coronary intervention. This, benefits and alternatives were discussed with the patient. The procedure is scheduled for tomorrow.

## 2015-02-22 NOTE — Assessment & Plan Note (Signed)
Persistent severe leg pain r>l secondary to neuropathy. Will continue Lyrica and use Hydrocodone as needed only for severe pain.

## 2015-02-22 NOTE — Patient Instructions (Signed)
You are scheduled for a cardiac catheterization on April 6 with Dr. Fletcher [name] or associate.  Go to [facility] [place] on April 6th  at 10:00 am.  Enter thru the [place] entrance A No food or drink after midnight on April 5th. You may take your medications with a sip of water on the day of your procedure.  Hold metformin the day of  Half dose of Gargline Insulin tonight and hold in the am Novolog hold in the am

## 2015-02-22 NOTE — Assessment & Plan Note (Signed)
Lab Results  Component Value Date   CHOL 189 11/16/2014   HDL 38.90* 11/16/2014   LDLCALC 25 05/14/2014   LDLDIRECT 116.0 11/16/2014   TRIG 306.0* 11/16/2014   CHOLHDL 5 11/16/2014

## 2015-02-22 NOTE — Progress Notes (Signed)
Pre visit review using our clinic review tool, if applicable. No additional management support is needed unless otherwise documented below in the visit note. 

## 2015-02-22 NOTE — Assessment & Plan Note (Signed)
CAD with recent chest tightness not improved with NTG. Multiple risk factors for recurrent CAD. Cardiology evaluation today. Question if he may need repeat cath.

## 2015-02-22 NOTE — Assessment & Plan Note (Signed)
BG continue to be elevated. Pt is compliant with insulin regimen, but, per wife, binge eats high sugar foods. Encouraged better compliance with diet. Plan to repeat A1c in 3 months. Will also set up endocrine follow up.

## 2015-02-23 ENCOUNTER — Ambulatory Visit (HOSPITAL_COMMUNITY)
Admission: RE | Admit: 2015-02-23 | Discharge: 2015-02-24 | Disposition: A | Discharge status: Home / Self Care | Payer: Medicare Other | Source: Ambulatory Visit | Attending: Cardiovascular Disease | Admitting: Cardiovascular Disease

## 2015-02-23 ENCOUNTER — Encounter (HOSPITAL_COMMUNITY): Payer: Self-pay | Admitting: Cardiovascular Disease

## 2015-02-23 ENCOUNTER — Encounter (HOSPITAL_COMMUNITY)
Admission: RE | Disposition: A | Discharge status: Home / Self Care | Payer: Medicare Other | Source: Ambulatory Visit | Attending: Cardiovascular Disease

## 2015-02-23 DIAGNOSIS — R079 Chest pain, unspecified: Secondary | ICD-10-CM | POA: Diagnosis present

## 2015-02-23 DIAGNOSIS — M79662 Pain in left lower leg: Secondary | ICD-10-CM | POA: Diagnosis present

## 2015-02-23 DIAGNOSIS — E119 Type 2 diabetes mellitus without complications: Secondary | ICD-10-CM | POA: Insufficient documentation

## 2015-02-23 DIAGNOSIS — Z7982 Long term (current) use of aspirin: Secondary | ICD-10-CM | POA: Diagnosis not present

## 2015-02-23 DIAGNOSIS — G20 Parkinson's disease: Secondary | ICD-10-CM | POA: Insufficient documentation

## 2015-02-23 DIAGNOSIS — Z8673 Personal history of transient ischemic attack (TIA), and cerebral infarction without residual deficits: Secondary | ICD-10-CM | POA: Diagnosis not present

## 2015-02-23 DIAGNOSIS — Z955 Presence of coronary angioplasty implant and graft: Secondary | ICD-10-CM

## 2015-02-23 DIAGNOSIS — M79605 Pain in left leg: Secondary | ICD-10-CM | POA: Diagnosis not present

## 2015-02-23 DIAGNOSIS — E669 Obesity, unspecified: Secondary | ICD-10-CM | POA: Diagnosis present

## 2015-02-23 DIAGNOSIS — T82858A Stenosis of vascular prosthetic devices, implants and grafts, initial encounter: Secondary | ICD-10-CM | POA: Diagnosis not present

## 2015-02-23 DIAGNOSIS — Z886 Allergy status to analgesic agent status: Secondary | ICD-10-CM | POA: Diagnosis not present

## 2015-02-23 DIAGNOSIS — Z7902 Long term (current) use of antithrombotics/antiplatelets: Secondary | ICD-10-CM | POA: Diagnosis not present

## 2015-02-23 DIAGNOSIS — I25110 Atherosclerotic heart disease of native coronary artery with unstable angina pectoris: Secondary | ICD-10-CM | POA: Diagnosis not present

## 2015-02-23 DIAGNOSIS — Z9049 Acquired absence of other specified parts of digestive tract: Secondary | ICD-10-CM | POA: Insufficient documentation

## 2015-02-23 DIAGNOSIS — Z794 Long term (current) use of insulin: Secondary | ICD-10-CM | POA: Insufficient documentation

## 2015-02-23 DIAGNOSIS — E785 Hyperlipidemia, unspecified: Secondary | ICD-10-CM | POA: Diagnosis not present

## 2015-02-23 DIAGNOSIS — Z888 Allergy status to other drugs, medicaments and biological substances status: Secondary | ICD-10-CM | POA: Insufficient documentation

## 2015-02-23 DIAGNOSIS — I10 Essential (primary) hypertension: Secondary | ICD-10-CM | POA: Insufficient documentation

## 2015-02-23 DIAGNOSIS — I200 Unstable angina: Secondary | ICD-10-CM | POA: Diagnosis present

## 2015-02-23 DIAGNOSIS — F329 Major depressive disorder, single episode, unspecified: Secondary | ICD-10-CM | POA: Diagnosis not present

## 2015-02-23 DIAGNOSIS — E1149 Type 2 diabetes mellitus with other diabetic neurological complication: Secondary | ICD-10-CM | POA: Diagnosis present

## 2015-02-23 DIAGNOSIS — Z87891 Personal history of nicotine dependence: Secondary | ICD-10-CM | POA: Diagnosis not present

## 2015-02-23 DIAGNOSIS — Z9861 Coronary angioplasty status: Secondary | ICD-10-CM | POA: Insufficient documentation

## 2015-02-23 DIAGNOSIS — Z6834 Body mass index (BMI) 34.0-34.9, adult: Secondary | ICD-10-CM | POA: Insufficient documentation

## 2015-02-23 DIAGNOSIS — I2510 Atherosclerotic heart disease of native coronary artery without angina pectoris: Secondary | ICD-10-CM | POA: Diagnosis present

## 2015-02-23 HISTORY — PX: LEFT HEART CATHETERIZATION WITH CORONARY ANGIOGRAM: SHX5451

## 2015-02-23 LAB — GLUCOSE, CAPILLARY
GLUCOSE-CAPILLARY: 311 mg/dL — AB (ref 70–99)
GLUCOSE-CAPILLARY: 263 mg/dL — AB (ref 70–99)
GLUCOSE-CAPILLARY: 270 mg/dL — AB (ref 70–99)
Glucose-Capillary: 340 mg/dL — ABNORMAL HIGH (ref 70–99)

## 2015-02-23 LAB — CBC
HCT: 43.6 % (ref 39.0–52.0)
HEMOGLOBIN: 14.3 g/dL (ref 13.0–17.0)
MCH: 24.4 pg — AB (ref 26.0–34.0)
MCHC: 32.8 g/dL (ref 30.0–36.0)
MCV: 74.5 fL — ABNORMAL LOW (ref 78.0–100.0)
Platelets: 219 10*3/uL (ref 150–400)
RBC: 5.85 MIL/uL — ABNORMAL HIGH (ref 4.22–5.81)
RDW: 16.1 % — ABNORMAL HIGH (ref 11.5–15.5)
WBC: 11.1 10*3/uL — ABNORMAL HIGH (ref 4.0–10.5)

## 2015-02-23 LAB — BASIC METABOLIC PANEL
ANION GAP: 11 (ref 5–15)
BUN: 10 mg/dL (ref 6–23)
CALCIUM: 8.6 mg/dL (ref 8.4–10.5)
CO2: 27 mmol/L (ref 19–32)
Chloride: 101 mmol/L (ref 96–112)
Creatinine, Ser: 1 mg/dL (ref 0.50–1.35)
GFR calc non Af Amer: 74 mL/min — ABNORMAL LOW (ref >90)
GFR, EST AFRICAN AMERICAN: 86 mL/min — AB (ref >90)
GLUCOSE: 347 mg/dL — AB (ref 70–99)
POTASSIUM: 3.5 mmol/L (ref 3.5–5.1)
Sodium: 139 mmol/L (ref 135–145)

## 2015-02-23 LAB — PROTIME-INR
INR: 0.95 (ref 0.00–1.49)
Prothrombin Time: 12.8 seconds (ref 11.6–15.2)

## 2015-02-23 LAB — POCT ACTIVATED CLOTTING TIME: ACTIVATED CLOTTING TIME: 601 s

## 2015-02-23 SURGERY — LEFT HEART CATHETERIZATION WITH CORONARY ANGIOGRAM
Anesthesia: LOCAL

## 2015-02-23 MED ORDER — SODIUM CHLORIDE 0.9 % IV SOLN
INTRAVENOUS | Status: DC
  Administered 2015-02-23: 10:00:00 via INTRAVENOUS

## 2015-02-23 MED ORDER — LIDOCAINE HCL (PF) 1 % IJ SOLN
INTRAMUSCULAR | Status: AC
  Filled 2015-02-23: qty 30

## 2015-02-23 MED ORDER — CLOPIDOGREL BISULFATE 75 MG PO TABS
75.0000 mg | ORAL_TABLET | Freq: Every day | ORAL | Status: DC
  Administered 2015-02-24: 75 mg via ORAL
  Filled 2015-02-23: qty 1

## 2015-02-23 MED ORDER — INSULIN ASPART 100 UNIT/ML ~~LOC~~ SOLN
48.0000 [IU] | Freq: Three times a day (TID) | SUBCUTANEOUS | Status: DC
  Administered 2015-02-23: 18:00:00 48 [IU] via SUBCUTANEOUS
  Administered 2015-02-24: 24 [IU] via SUBCUTANEOUS
  Administered 2015-02-24: 48 [IU] via SUBCUTANEOUS
  Administered 2015-02-24: 30 [IU] via SUBCUTANEOUS

## 2015-02-23 MED ORDER — ONDANSETRON HCL 4 MG/2ML IJ SOLN: 4.0000 mg | Freq: Four times a day (QID) | INTRAMUSCULAR | Status: DC | PRN

## 2015-02-23 MED ORDER — SODIUM CHLORIDE 0.9 % IJ SOLN: 3.0000 mL | Freq: Two times a day (BID) | INTRAMUSCULAR | Status: DC

## 2015-02-23 MED ORDER — ACETAMINOPHEN 325 MG PO TABS
650.0000 mg | ORAL_TABLET | ORAL | Status: DC | PRN
  Administered 2015-02-24: 650 mg via ORAL
  Filled 2015-02-23: qty 2

## 2015-02-23 MED ORDER — BIVALIRUDIN 250 MG IV SOLR
0.2500 mg/kg/h | INTRAVENOUS | Status: DC
  Filled 2015-02-23: qty 250

## 2015-02-23 MED ORDER — TIZANIDINE HCL 4 MG PO TABS
4.0000 mg | ORAL_TABLET | Freq: Three times a day (TID) | ORAL | Status: DC | PRN
  Administered 2015-02-23: 20:00:00 4 mg via ORAL
  Filled 2015-02-23 (×2): qty 1

## 2015-02-23 MED ORDER — HEPARIN (PORCINE) IN NACL 2-0.9 UNIT/ML-% IJ SOLN
INTRAMUSCULAR | Status: AC
  Filled 2015-02-23: qty 1000

## 2015-02-23 MED ORDER — ASPIRIN EC 81 MG PO TBEC
81.0000 mg | DELAYED_RELEASE_TABLET | Freq: Three times a day (TID) | ORAL | Status: DC
  Administered 2015-02-23 – 2015-02-24 (×4): 81 mg via ORAL
  Filled 2015-02-23 (×4): qty 1

## 2015-02-23 MED ORDER — NITROGLYCERIN 0.4 MG SL SUBL: 0.4000 mg | SUBLINGUAL_TABLET | SUBLINGUAL | Status: DC | PRN

## 2015-02-23 MED ORDER — CARBIDOPA-LEVODOPA 25-100 MG PO TABS
2.0000 | ORAL_TABLET | Freq: Three times a day (TID) | ORAL | Status: DC
  Administered 2015-02-23 – 2015-02-24 (×4): 2 via ORAL
  Filled 2015-02-23 (×5): qty 2

## 2015-02-23 MED ORDER — PREGABALIN 100 MG PO CAPS
150.0000 mg | ORAL_CAPSULE | Freq: Two times a day (BID) | ORAL | Status: DC
  Administered 2015-02-23 – 2015-02-24 (×2): 150 mg via ORAL
  Filled 2015-02-23 (×2): qty 2
  Filled 2015-02-23 (×2): qty 1

## 2015-02-23 MED ORDER — FENTANYL CITRATE 0.05 MG/ML IJ SOLN
INTRAMUSCULAR | Status: AC
  Filled 2015-02-23: qty 2

## 2015-02-23 MED ORDER — LISINOPRIL 20 MG PO TABS
20.0000 mg | ORAL_TABLET | Freq: Two times a day (BID) | ORAL | Status: DC
  Administered 2015-02-23 – 2015-02-24 (×2): 20 mg via ORAL
  Filled 2015-02-23 (×4): qty 1

## 2015-02-23 MED ORDER — CLOPIDOGREL BISULFATE 300 MG PO TABS
ORAL_TABLET | ORAL | Status: AC
Start: 2015-02-23 — End: 2015-02-23
  Filled 2015-02-23: qty 1

## 2015-02-23 MED ORDER — LABETALOL HCL 5 MG/ML IV SOLN
INTRAVENOUS | Status: AC
  Filled 2015-02-23: qty 4

## 2015-02-23 MED ORDER — NITROGLYCERIN 1 MG/10 ML FOR IR/CATH LAB
INTRA_ARTERIAL | Status: AC
  Filled 2015-02-23: qty 10

## 2015-02-23 MED ORDER — CARVEDILOL 12.5 MG PO TABS
25.0000 mg | ORAL_TABLET | Freq: Two times a day (BID) | ORAL | Status: DC
  Administered 2015-02-23 – 2015-02-24 (×3): 25 mg via ORAL
  Filled 2015-02-23 (×4): qty 2

## 2015-02-23 MED ORDER — LABETALOL HCL 5 MG/ML IV SOLN
20.0000 mg | INTRAVENOUS | Status: DC | PRN
Start: 2015-02-23 — End: 2015-02-24
  Administered 2015-02-24: 20 mg via INTRAVENOUS
  Filled 2015-02-23: qty 4

## 2015-02-23 MED ORDER — ROSUVASTATIN CALCIUM 20 MG PO TABS
20.0000 mg | ORAL_TABLET | Freq: Every day | ORAL | Status: DC
  Administered 2015-02-23 – 2015-02-24 (×2): 20 mg via ORAL
  Filled 2015-02-23 (×2): qty 1

## 2015-02-23 MED ORDER — AMLODIPINE BESYLATE 5 MG PO TABS
5.0000 mg | ORAL_TABLET | Freq: Every day | ORAL | Status: DC
  Administered 2015-02-24: 5 mg via ORAL
  Filled 2015-02-23: qty 1

## 2015-02-23 MED ORDER — MIDAZOLAM HCL 2 MG/2ML IJ SOLN
INTRAMUSCULAR | Status: AC
  Filled 2015-02-23: qty 2

## 2015-02-23 MED ORDER — PANTOPRAZOLE SODIUM 40 MG PO TBEC
40.0000 mg | DELAYED_RELEASE_TABLET | Freq: Two times a day (BID) | ORAL | Status: DC
  Administered 2015-02-23 – 2015-02-24 (×2): 40 mg via ORAL
  Filled 2015-02-23 (×3): qty 1

## 2015-02-23 MED ORDER — DULOXETINE HCL 60 MG PO CPEP
60.0000 mg | ORAL_CAPSULE | Freq: Every day | ORAL | Status: DC
  Administered 2015-02-24: 60 mg via ORAL
  Filled 2015-02-23: qty 1

## 2015-02-23 MED ORDER — HYDROCODONE-ACETAMINOPHEN 5-325 MG PO TABS
1.0000 | ORAL_TABLET | ORAL | Status: DC | PRN
  Administered 2015-02-23 – 2015-02-24 (×2): 2 via ORAL
  Filled 2015-02-23 (×2): qty 2

## 2015-02-23 MED ORDER — ASPIRIN 81 MG PO CHEW
81.0000 mg | CHEWABLE_TABLET | ORAL | Status: AC
  Administered 2015-02-23: 81 mg via ORAL

## 2015-02-23 MED ORDER — ASPIRIN 81 MG PO CHEW
CHEWABLE_TABLET | ORAL | Status: AC
  Administered 2015-02-23: 81 mg via ORAL
  Filled 2015-02-23: qty 1

## 2015-02-23 MED ORDER — CYANOCOBALAMIN 1000 MCG/ML IJ SOLN: 1000.0000 ug | Freq: Once | INTRAMUSCULAR | Status: DC

## 2015-02-23 MED ORDER — SODIUM CHLORIDE 0.9 % IV SOLN: INTRAVENOUS | Status: AC

## 2015-02-23 MED ORDER — SODIUM CHLORIDE 0.9 % IJ SOLN: 3.0000 mL | INTRAMUSCULAR | Status: DC | PRN

## 2015-02-23 MED ORDER — HYDRALAZINE HCL 20 MG/ML IJ SOLN
10.0000 mg | Freq: Once | INTRAMUSCULAR | Status: AC
  Administered 2015-02-23: 18:00:00 10 mg via INTRAVENOUS
  Filled 2015-02-23: qty 1

## 2015-02-23 MED ORDER — SODIUM CHLORIDE 0.9 % IV SOLN: 250.0000 mL | INTRAVENOUS | Status: DC | PRN

## 2015-02-23 MED ORDER — ASPIRIN 81 MG PO TABS: 81.0000 mg | ORAL_TABLET | Freq: Three times a day (TID) | ORAL | Status: DC

## 2015-02-23 MED ORDER — BIVALIRUDIN 250 MG IV SOLR
INTRAVENOUS | Status: AC
  Filled 2015-02-23: qty 250

## 2015-02-23 MED ORDER — ATORVASTATIN CALCIUM 40 MG PO TABS
40.0000 mg | ORAL_TABLET | Freq: Every day | ORAL | Status: DC
  Filled 2015-02-23: qty 1

## 2015-02-23 MED ORDER — LISINOPRIL-HYDROCHLOROTHIAZIDE 20-12.5 MG PO TABS: 1.0000 | ORAL_TABLET | Freq: Two times a day (BID) | ORAL | Status: DC

## 2015-02-23 MED ORDER — INSULIN GLARGINE 100 UNIT/ML ~~LOC~~ SOLN
55.0000 [IU] | Freq: Two times a day (BID) | SUBCUTANEOUS | Status: DC
  Administered 2015-02-23: 55 [IU] via SUBCUTANEOUS
  Filled 2015-02-23 (×4): qty 0.55

## 2015-02-23 MED ORDER — HEPARIN SODIUM (PORCINE) 1000 UNIT/ML IJ SOLN
INTRAMUSCULAR | Status: AC
  Filled 2015-02-23: qty 1

## 2015-02-23 MED ORDER — INSULIN GLARGINE 300 UNIT/ML ~~LOC~~ SOPN: 55.0000 [IU] | PEN_INJECTOR | Freq: Two times a day (BID) | SUBCUTANEOUS | Status: DC

## 2015-02-23 MED ORDER — HYDROCHLOROTHIAZIDE 12.5 MG PO CAPS
12.5000 mg | ORAL_CAPSULE | Freq: Two times a day (BID) | ORAL | Status: DC
  Administered 2015-02-23 – 2015-02-24 (×2): 12.5 mg via ORAL
  Filled 2015-02-23 (×4): qty 1

## 2015-02-23 MED ORDER — VERAPAMIL HCL 2.5 MG/ML IV SOLN
INTRAVENOUS | Status: AC
  Filled 2015-02-23: qty 2

## 2015-02-23 NOTE — H&P (View-Only) (Signed)
HPI  This is a 69 year old male who is here today for followup visit on an urgent basis due to chest pain. He has known history of coronary artery disease status post multiple PCI in the past at [facility]. He presented in April of 2013 with chest pain. He underwent cardiac catheterization which showed patent stents. However, there was a 90% stenosis in the proximal first diagonal. There was 40% disease in the mid LAD and moderate disease in the left circumflex. He underwent an angioplasty and drug-eluting stent placement to the diagonal without complications.  he has known history of refractory hypertension. Renal artery duplex ultrasound showed no evidence of renal artery stenosis. He started having substernal chest tightness over the last 2 weeks which has worsened recently. This is similar to his previous angina. This is now happening at rest and sometimes it lasted for 30 minutes. It only responded partially to nitroglycerin on Sunday and he went to the emergency room at [facility]. ECG showed lateral T wave changes. Cardiac enzymes were unremarkable.  Allergies  Allergen Reactions  . Codeine Nausea Only    Derivatives Nausea.  . Tramadol     unknown  . Trazodone And Nefazodone Nausea Only     Current Outpatient Prescriptions on File Prior to Visit  Medication Sig Dispense Refill  . amLODipine (NORVASC) 5 MG tablet Take 1 tablet (5 mg total) by mouth daily. 90 tablet 3  . aspirin 81 MG tablet Take 81 mg by mouth 3 (three) times daily.    . carbidopa-levodopa (SINEMET IR) 25-100 MG per tablet Take 2 tablets by mouth 3 (three) times daily. 270 tablet 2  . carvedilol (COREG) 25 MG tablet TAKE ONE TABLET BY MOUTH TWICE DAILY 180 tablet 1  . clopidogrel (PLAVIX) 75 MG tablet Take 1 tablet (75 mg total) by mouth daily with breakfast. 90 tablet 1  . cyanocobalamin (,VITAMIN B-12,) 1000 MCG/ML injection Inject 1,000 mcg into the muscle once.    . DULoxetine (CYMBALTA) 60 MG capsule TAKE 1  CAPSULE DAILY 90 capsule 3  . glucose blood (FREESTYLE TEST STRIPS) test strip Use to check blood sugar up to three times daily 300 each 1  . HYDROcodone-acetaminophen (NORCO/VICODIN) 5-325 MG per tablet Take 1-2 tablets by mouth every 8 (eight) hours as needed. 90 tablet 0  . insulin aspart (NOVOLOG) 100 UNIT/ML injection Inject 48-50 units 3 times a day with meals as instructed. 140 mL 1  . Insulin Glargine (TOUJEO SOLOSTAR) 300 UNIT/ML SOPN Inject 55 Units into the skin 2 (two) times daily at 8 am and 10 pm. (Patient taking differently: Inject 70 Units into the skin 2 (two) times daily at 8 am and 10 pm. ) 27 pen 1  . lisinopril-hydrochlorothiazide (PRINZIDE,ZESTORETIC) 20-12.5 MG per tablet Take 1 tablet by mouth 2 (two) times daily. 180 tablet 1  . metFORMIN (GLUCOPHAGE) 1000 MG tablet TAKE ONE TABLET BY MOUTH TWICE DAILY 180 tablet 1  . nitroGLYCERIN (NITROSTAT) 0.4 MG SL tablet Place 1 tablet (0.4 mg total) under the tongue every 5 (five) minutes as needed for chest pain. 30 tablet 6  . pantoprazole (PROTONIX) 40 MG tablet Take 1 tablet (40 mg total) by mouth 2 (two) times daily. 180 tablet 3  . pregabalin (LYRICA) 150 MG capsule Take 1 capsule (150 mg total) by mouth 2 (two) times daily. 180 capsule 2  . tiZANidine (ZANAFLEX) 4 MG tablet TAKE ONE TABLET BY MOUTH EVERY 8 HOURS AS NEEDED FOR PAIN 63 tablet 0  No current facility-administered medications on file prior to visit.     Past Medical History  Diagnosis Date  . Shingles   . Diabetes mellitus   . Hyperlipidemia   . Broken leg     left...s/p pins  . Lower leg pain     chronic ,left leg  . Acute angina   . Depression   . CAD (coronary artery disease)     s/p multiple PCI with stent RCA,LAD and obtuse marginal,followed @ [facility] on the accord study.., cath 02/2012 90% D1, s/p drug-eluting stent Dr. Fletcher [name]  . Falls 09/2011  . Dizziness   . Shortness of breath   . Headache(784.0)   . Hypertension     dr Jerilynn Mages Audelia Acton     labauer in  [place]  . Stroke   . Bell's palsy   . Parkinson disease   . Neuropathy      Past Surgical History  Procedure Laterality Date  . Appendectomy    . Cholecystectomy    . Left leg surgery    . Left foot surgery    . Drainage port in left testicle    . Cardiac catheterization  02/2012  . Back surgery    . Anterior cervical decomp/discectomy fusion N/A 04/30/2013    Procedure: ANTERIOR CERVICAL DECOMPRESSION/DISCECTOMY FUSION 2 LEVELS;  Surgeon: Faythe Ghee, MD;  Location: [facility];  Service: Neurosurgery;  Laterality: N/A;  Cervical four-five, Cervical six-seven anterior cervical decompression fusion with trabecular metal cage and plate     Family History  Problem Relation Age of Onset  . Cancer Mother     BREAST  . Heart disease Mother     STENT  . Cancer Father     THROAT  . Heart disease Sister     STENT; HTN  . Heart attack Brother   . Hypertension Brother   . Hypertension Sister      History   Social History  . Marital Status: Married    Spouse Name: N/A  . Number of Children: N/A  . Years of Education: N/A   Occupational History  . Not on file.   Social History Main Topics  . Smoking status: Former Smoker    Quit date: 11/08/1971  . Smokeless tobacco: Never Used  . Alcohol Use: No  . Drug Use: No  . Sexual Activity: Not on file   Other Topics Concern  . Not on file   Social History Narrative      PHYSICAL EXAM   BP 168/90 mmHg  Pulse 58  Ht 6' (1.829 m)  Wt 259 lb 8 oz (117.708 kg)  BMI 35.19 kg/m2 Constitutional: He is oriented to person, place, and time. He appears well-developed and well-nourished. No distress.  HENT: No nasal discharge.  Head: Normocephalic and atraumatic.  Eyes: Pupils are equal and round. Right eye exhibits no discharge. Left eye exhibits no discharge.  Neck: Normal range of motion. Neck supple. No JVD present. No thyromegaly present.  Cardiovascular: Normal rate, regular rhythm, normal heart sounds and.  Exam reveals no gallop and no friction rub. No murmur heard.  Pulmonary/Chest: Effort normal and breath sounds normal. No stridor. No respiratory distress. He has no wheezes. He has no rales. He exhibits no tenderness.  Abdominal: Soft. Bowel sounds are normal. He exhibits no distension. There is no tenderness. There is no rebound and no guarding.  Musculoskeletal: Normal range of motion. He exhibits no edema and no tenderness.  Neurological: He is alert and oriented to  person, place, and time. Coordination normal.  Skin: Skin is warm and dry. No rash noted. He is not diaphoretic. No erythema. No pallor.  Psychiatric: He has a normal mood and affect. His behavior is normal. Judgment and thought content normal.     EKG: Sinus bradycardia with first-degree AV block. Lateral T wave changes suggestive of ischemia.    ASSESSMENT AND PLAN

## 2015-02-23 NOTE — Interval H&P Note (Signed)
History and Physical Interval Note:  02/23/2015 12:44 PM  Steve Phillips  has presented today for surgery, with the diagnosis of unstable angina  The various methods of treatment have been discussed with the patient and family. After consideration of risks, benefits and other options for treatment, the patient has consented to  Procedure(s): LEFT HEART CATHETERIZATION WITH CORONARY ANGIOGRAM (N/A) as a surgical intervention .  The patient's history has been reviewed, patient examined, no change in status, stable for surgery.  I have reviewed the patient's chart and labs.  Questions were answered to the patient's satisfaction.     [name]

## 2015-02-23 NOTE — CV Procedure (Signed)
Cardiac Catheterization Procedure Note  Name: MOXON MESSLER MRN: [id number] DOB: [birth year]  Procedure: Left Heart Cath, Selective Coronary Angiography,  PTCA and stenting of the mid LAD  Indication: unstable angina with known history of CAD  Medications:  Sedation:  1 mg IV Versed, 50 mcg IV Fentanyl  Contrast:  200 ml Omnipaque  Procedural Details: The right wrist was prepped, draped, and anesthetized with 1% lidocaine. Using the modified Seldinger technique, a 5 French Slender sheath was introduced into the right radial artery. 3 mg of verapamil was administered through the sheath, weight-based unfractionated heparin was administered intravenously. A Jackie catheter could not engage the coronary arteries due to tortuosity in the innominate artery. A JL 3.5 catheter was used to engage the left main coronary artery. An AR-1 catheter was used to engage the right coronary artery nonselectively. I could not cross the aortic valve with a pigtail catheter but was able to cross with the guiding catheter and recorded pressures. Catheter exchanges were performed over an exchange length guidewire. There were no immediate procedural complications.  Procedural Findings:  Hemodynamics: AO: 190/84    mmHg LV:  185/8   mmHg LVEDP: 24  mmHg  Coronary angiography: Coronary dominance: Codominant   Left Main:  Normal  Left Anterior Descending (LAD):  Normal in size and moderately calcified. There is a 20% proximal stenosis. In the midsegment, a stent is noted with 60% in-stent restenosis proximally. Just proximal to the stented area, there is a 90% stenosis close to the origin of first diagonal. There is 20% stenosis distal to the stent and 40% disease in the distal LAD with 70% stenosis close to the apex.  1st diagonal (D1):   Large in size with patent stent proximally. There is diffuse 30% disease in the midsegment. This covers second diagonal distribution.  3rd diagonal (D3):  Small in size with  40% ostial stenosis.  Circumflex (LCx):  Normal in size and codominant. A stent is noted in the midsegment with no significant restenosis.  1st obtuse marginal:  Medium in size with high takeoff. There is 80% diffuse disease in the midsegment.  2nd obtuse marginal:  Normal in size with 60% proximal stenosis.  3rd obtuse marginal:  Normal in size with minor irregularities.   AV groove continuation segment: Normal in size with 40% ostial stenosis. This gives 2 PL branches which are free of significant disease.  Right Coronary Artery: Normal in size and codominant. There is diffuse 20% disease proximally. A stent is noted in the midsegment with diffuse 20-30% in-stent restenosis.  Posterior descending artery: Normal in size with minor irregularities.   Left ventriculography:  Was not performed  PCI Note:  Following the diagnostic procedure, the decision was made to proceed with PCI.  Weight-based bivalirudin was given for anticoagulation. Once a therapeutic ACT was achieved, a 6 [place] left 3.5 guide catheter was inserted.  A run through coronary guidewire was used to cross the lesion in the mid LAD.  The lesion was predilated with a 2.5 x 12 balloon. However, there was a tiny rupture in the balloon. I tried to use an angioscope balloon but could not deliver. Thus, I used a 2.5 x 15 mm noncompliant balloon to predilate.  The lesion was then stented with a 2.75 x 23 mm Xience Albine drug-eluting stent.  The stent was postdilated with a 3.0 x 15 noncompliant balloon.  Following PCI, there was 0% residual stenosis and TIMI-3 flow. Final angiography confirmed an excellent result. The  patient tolerated the procedure well. There were no immediate procedural complications. A TR band was used for radial hemostasis. The patient was transferred to the post catheterization recovery area for further monitoring.  PCI Data: Vessel - LAD/Segment - mid Percent Stenosis (pre)  : 90% TIMI-flow : 3 Stent :  2.75 x 23 mm Xience Albine drug-eluting stent Percent Stenosis (post) 0% TIMI-flow (post) 3   Final Conclusions:  1. Known three-vessel coronary artery disease with patent stents in the RCA, left circumflex and first diagonal. Severe in-stent restenosis in the mid LAD extending proximal to the stent. 2. Severely elevated blood pressure and moderately elevated left ventricular end-diastolic pressure. 3. Successful angioplasty and drug-eluting stent placement to the mid LAD.  Recommendations:  The patient overall has diffuse diabetic vessels. Aggressive treatment of risk factors is recommended. Continue long-term dual antiplatelet therapy.  [name] MD, [facility] 02/23/2015, 2:10 PM

## 2015-02-24 ENCOUNTER — Encounter (HOSPITAL_COMMUNITY): Payer: Self-pay | Admitting: *Deleted

## 2015-02-24 DIAGNOSIS — M79609 Pain in unspecified limb: Secondary | ICD-10-CM | POA: Diagnosis not present

## 2015-02-24 DIAGNOSIS — I10 Essential (primary) hypertension: Secondary | ICD-10-CM | POA: Diagnosis not present

## 2015-02-24 DIAGNOSIS — F329 Major depressive disorder, single episode, unspecified: Secondary | ICD-10-CM | POA: Diagnosis not present

## 2015-02-24 DIAGNOSIS — E785 Hyperlipidemia, unspecified: Secondary | ICD-10-CM | POA: Diagnosis not present

## 2015-02-24 DIAGNOSIS — I25110 Atherosclerotic heart disease of native coronary artery with unstable angina pectoris: Secondary | ICD-10-CM | POA: Diagnosis not present

## 2015-02-24 DIAGNOSIS — M79662 Pain in left lower leg: Secondary | ICD-10-CM | POA: Diagnosis present

## 2015-02-24 DIAGNOSIS — E669 Obesity, unspecified: Secondary | ICD-10-CM | POA: Diagnosis not present

## 2015-02-24 DIAGNOSIS — I200 Unstable angina: Secondary | ICD-10-CM | POA: Diagnosis not present

## 2015-02-24 DIAGNOSIS — E119 Type 2 diabetes mellitus without complications: Secondary | ICD-10-CM | POA: Diagnosis not present

## 2015-02-24 LAB — GLUCOSE, CAPILLARY
GLUCOSE-CAPILLARY: 176 mg/dL — AB (ref 70–99)
Glucose-Capillary: 201 mg/dL — ABNORMAL HIGH (ref 70–99)
Glucose-Capillary: 224 mg/dL — ABNORMAL HIGH (ref 70–99)

## 2015-02-24 LAB — CBC
HCT: 42.8 % (ref 39.0–52.0)
HEMOGLOBIN: 13.8 g/dL (ref 13.0–17.0)
MCH: 24.3 pg — ABNORMAL LOW (ref 26.0–34.0)
MCHC: 32.2 g/dL (ref 30.0–36.0)
MCV: 75.4 fL — ABNORMAL LOW (ref 78.0–100.0)
Platelets: 219 10*3/uL (ref 150–400)
RBC: 5.68 MIL/uL (ref 4.22–5.81)
RDW: 16.4 % — AB (ref 11.5–15.5)
WBC: 11.6 10*3/uL — AB (ref 4.0–10.5)

## 2015-02-24 LAB — BASIC METABOLIC PANEL
Anion gap: 9 (ref 5–15)
BUN: 10 mg/dL (ref 6–23)
CO2: 27 mmol/L (ref 19–32)
Calcium: 8.4 mg/dL (ref 8.4–10.5)
Chloride: 103 mmol/L (ref 96–112)
Creatinine, Ser: 1 mg/dL (ref 0.50–1.35)
GFR calc Af Amer: 86 mL/min — ABNORMAL LOW (ref >90)
GFR calc non Af Amer: 74 mL/min — ABNORMAL LOW (ref >90)
GLUCOSE: 199 mg/dL — AB (ref 70–99)
Potassium: 3 mmol/L — ABNORMAL LOW (ref 3.5–5.1)
SODIUM: 139 mmol/L (ref 135–145)

## 2015-02-24 MED ORDER — POTASSIUM CHLORIDE CRYS ER 20 MEQ PO TBCR
40.0000 meq | EXTENDED_RELEASE_TABLET | Freq: Once | ORAL | Status: AC
  Administered 2015-02-24: 40 meq via ORAL
  Filled 2015-02-24: qty 2

## 2015-02-24 MED ORDER — METFORMIN HCL 1000 MG PO TABS: ORAL_TABLET | ORAL | Status: DC

## 2015-02-24 MED ORDER — HYDRALAZINE HCL 25 MG PO TABS
25.0000 mg | ORAL_TABLET | Freq: Two times a day (BID) | ORAL | Status: DC
  Administered 2015-02-24: 25 mg via ORAL
  Filled 2015-02-24 (×2): qty 1

## 2015-02-24 MED ORDER — ROSUVASTATIN CALCIUM 20 MG PO TABS: 20.0000 mg | ORAL_TABLET | Freq: Every day | ORAL | Status: DC

## 2015-02-24 MED ORDER — HYDRALAZINE HCL 25 MG PO TABS: 25.0000 mg | ORAL_TABLET | Freq: Two times a day (BID) | ORAL | Status: DC

## 2015-02-24 MED ORDER — ACETAMINOPHEN 325 MG PO TABS: 650.0000 mg | ORAL_TABLET | ORAL | Status: AC | PRN

## 2015-02-24 MED FILL — Sodium Chloride IV Soln 0.9%: INTRAVENOUS | Qty: 50 | Status: AC

## 2015-02-24 NOTE — Evaluation (Signed)
Physical Therapy Evaluation Patient Details Name: Steve Phillips MRN: [id number] DOB: 02/17/1946 Today's Date: 02/24/2015   History of Present Illness  Patient is a 69 y/o male admitted with unstable angina with hx of CAD s/p left heart cath with coronary angiogram. PMH of PD, HTN, HLD.    Clinical Impression  Patient presents with generalized weakness (hx of PD) which has been occurring for the last couple of months with multiple reported falls. Now s/p cardiac cath resulting in improved endurance and ambulation however pt with increased difficulty standing from low surfaces and bed mobility. Fearful of falling. Education provided on safety techniques for home environment. Encouraged use of RW at all times instead of SPC and use of BSC to assist with standing from toilet. Pt will have 24/7 S from wife. Would benefit from skilled PT and HHPT to improve transfers,gait, balance and mobility so pt can ease burden of care and maximize independence.     Follow Up Recommendations Home health PT;Supervision/Assistance - 24 hour    Equipment Recommendations  3in1 (PT)    Recommendations for Other Services OT consult     Precautions / Restrictions Precautions Precautions: Fall Precaution Comments: hx of PD Restrictions Weight Bearing Restrictions: No      Mobility  Bed Mobility Overal bed mobility: Needs Assistance Bed Mobility: Supine to Sit     Supine to sit: Mod assist     General bed mobility comments: Mod A to elevate trunk to get to seated position. HOB flat, no rails.   Transfers Overall transfer level: Needs assistance Equipment used: Rolling walker (2 wheeled) Transfers: Sit to/from Stand Sit to Stand: Mod assist         General transfer comment: Mod A to rise from EOB with cues for hand placement and anterior translation. Increased difficulty with transition due to weakness.   Ambulation/Gait Ambulation/Gait assistance: Min guard Ambulation Distance (Feet): 200  Feet Assistive device: Rolling walker (2 wheeled) Gait Pattern/deviations: Step-through pattern;Decreased stride length;Trunk flexed   Gait velocity interpretation: Below normal speed for age/gender General Gait Details: Pt with short shuffling steps. Dyspnea present. Vitals stable throughout. Cues for RW management.  Stairs            Wheelchair Mobility    Modified Rankin (Stroke Patients Only)       Balance Overall balance assessment: Needs assistance;History of Falls Sitting-balance support: Feet supported;No upper extremity supported Sitting balance-Leahy Scale: Fair     Standing balance support: During functional activity Standing balance-Leahy Scale: Poor Standing balance comment: Reilent on RW for support.                             Pertinent Vitals/Pain Pain Assessment: No/denies pain    Home Living Family/patient expects to be discharged to:: Private residence Living Arrangements: Spouse/significant other Available Help at Discharge: Family;Available 24 hours/day Type of Home: House Home Access: Ramped entrance     Home Layout: One level Home Equipment: Walker - 2 wheels;Walker - 4 wheels;Cane - single point;Shower seat      Prior Function Level of Independence: Needs assistance   Gait / Transfers Assistance Needed: Pt is a furniture walker at baseline or uses SPC as needed. More recently has needed RW for support. Reports multiple falls.  ADL's / Homemaking Assistance Needed: Assist for ADLs at baseline.  Comments: Reports difficulty standing from surfaces.     Hand Dominance        Extremity/Trunk Assessment  Upper Extremity Assessment: Defer to OT evaluation           Lower Extremity Assessment: Generalized weakness;RLE deficits/detail;LLE deficits/detail (Some stiffness noted BLEs. )         Communication   Communication: No difficulties  Cognition Arousal/Alertness: Awake/alert Behavior During Therapy: WFL for  tasks assessed/performed Overall Cognitive Status: History of cognitive impairments - at baseline                      General Comments General comments (skin integrity, edema, etc.): Family present in room during PT evaluation.    Exercises        Assessment/Plan    PT Assessment Patient needs continued PT services  PT Diagnosis Generalized weakness   PT Problem List Decreased strength;Impaired sensation;Decreased activity tolerance;Decreased balance;Decreased mobility  PT Treatment Interventions Balance training;Gait training;Patient/family education;Functional mobility training;Therapeutic activities;Therapeutic exercise   PT Goals (Current goals can be found in the Care Plan section) Acute Rehab PT Goals Patient Stated Goal: to get back to walking and not being afraid of falling PT Goal Formulation: With patient Time For Goal Achievement: 03/10/15 Potential to Achieve Goals: Fair    Frequency Min 3X/week   Barriers to discharge        Co-evaluation               End of Session Equipment Utilized During Treatment: Gait belt Activity Tolerance: Patient tolerated treatment well Patient left: in bed;with call bell/phone within reach;with family/visitor present (sitting EOB.) Nurse Communication: Mobility status    Functional Assessment Tool Used: Clinical judgment Functional Limitation: Mobility: Walking and moving around Mobility: Walking and Moving Around Current Status [phone]): At least 40 percent but less than 60 percent impaired, limited or restricted Mobility: Walking and Moving Around Goal Status [phone]): At least 20 percent but less than 40 percent impaired, limited or restricted    Time: 8381-8403 PT Time Calculation (min) (ACUTE ONLY): 25 min   Charges:   PT Evaluation $Initial PT Evaluation Tier I: 1 Procedure PT Treatments $Self Care/Home Management: 8-22   PT G Codes:   PT G-Codes **NOT FOR INPATIENT CLASS** Functional Assessment Tool  Used: Clinical judgment Functional Limitation: Mobility: Walking and moving around Mobility: Walking and Moving Around Current Status (F5436): At least 40 percent but less than 60 percent impaired, limited or restricted Mobility: Walking and Moving Around Goal Status [phone]): At least 20 percent but less than 40 percent impaired, limited or restricted    Candy Sledge A 02/24/2015, 4:25 PM Candy Sledge, [place], DPT [phone]

## 2015-02-24 NOTE — Progress Notes (Signed)
CARDIAC REHAB PHASE I   PRE:  Rate/Rhythm: 68 SR    BP: sitting 160/64    SaO2:   MODE:  Ambulation: 300 ft   POST:  Rate/Rhythm: 86 SR    BP: sitting 156/61     SaO2: 95 RA  Pt difficult to get OOB due to weak core/Parkinsons. Slow pace with RW but did ok. Denied CP and sts he feels better. He sts he tries to go to gym and do leg weights to strengthen them. Encouraged him to continue to try to do this and also to walk multiple times a day with his RW. Discussed decreasing carbs to control DM better. "I like to eat". Pt sometimes drinks regular soft drinks, discussed impact of this on his DM. Pt interested in [facility] and will send referral to [place]. [place], [place], ACSM 02/24/2015 9:04 AM

## 2015-02-24 NOTE — Discharge Instructions (Signed)
Coronary Angiogram With Stent, Care After °Refer to this sheet in the next few weeks. These instructions provide you with information on caring for yourself after your procedure. Your health care provider may also give you more specific instructions. Your treatment has been planned according to current medical practices, but problems sometimes occur. Call your health care provider if you have any problems or questions after your procedure.  °WHAT TO EXPECT AFTER THE PROCEDURE  °The insertion site may be tender for a few days after your procedure. °HOME CARE INSTRUCTIONS  °· Take medicines only as directed by your health care provider. Blood thinners may be prescribed after your procedure to improve blood flow through the stent. °· Change any bandages (dressings) as directed by your health care provider.   °· Check your insertion site every day for redness, swelling, or fluid leaking from the insertion.   °· Do not take baths, swim, or use a hot tub until your health care provider approves. You may shower. Pat the insertion area dry. Do not rub the insertion area with a washcloth or towel.   °· Eat a heart-healthy diet. This should include plenty of fresh fruits and vegetables. Meat should be lean cuts. Avoid the following types of food:   °¨ Food that is high in salt.   °¨ Canned or highly processed food.   °¨ Food that is high in saturated fat or sugar.   °¨ Fried food.   °· Make any other lifestyle changes recommended by your health care provider. This may include:   °¨ Not using any tobacco products including cigarettes, chewing tobacco, or electronic cigarettes.  °¨ Managing your weight.   °¨ Getting regular exercise.   °¨ Managing your blood pressure.   °¨ Limiting your alcohol intake.   °¨ Managing other health problems, such as diabetes.   °· If you need an MRI after your heart stent was placed, be sure to tell the health care provider who orders the MRI that you have a heart stent.   °· Keep all follow-up  visits as directed by your health care provider.   °SEEK IMMEDIATE MEDICAL CARE IF:  °· You develop chest pain, shortness of breath, feel faint, or pass out. °· You have bleeding, swelling larger than a walnut, or drainage from the catheter insertion site. °· You develop pain, discoloration, coldness, or severe bruising in the leg or arm that held the catheter. °· You develop bleeding from any other place such as from the bowels. There may be bright red blood in the urine or stools, or it may appear as black, tarry stools. °· You have a fever or chills. °MAKE SURE YOU: °· Understand these instructions. °· Will watch your condition. °· Will get help right away if you are not doing well or get worse. °Document Released: 05/25/2005 Document Revised: 03/22/2014 Document Reviewed: 04/08/2013 °ExitCare® Patient Information ©2015 [employer]. This information is not intended to replace advice given to you by your health care provider. Make sure you discuss any questions you have with your health care provider. ° °

## 2015-02-24 NOTE — Progress Notes (Signed)
Patient needs bridge scrips as electronic scrips were sent to mail order source.  Wife assured me that she would obtain these tomorrow from [place].

## 2015-02-24 NOTE — Progress Notes (Signed)
Left lower extremity venous duplex completed.  Left:  No evidence of DVT, superficial thrombosis, or Baker's cyst.  Right:  Negative for DVT in the common femoral vein.  

## 2015-02-24 NOTE — Evaluation (Addendum)
Occupational Therapy Evaluation Patient Details Name: Steve Phillips MRN: [id number] DOB: [birth year] Today's Date: 02/24/2015    History of Present Illness Patient is a 69 y/o male admitted with unstable angina with hx of CAD s/p left heart cath with coronary angiogram. PMH of PD, HTN, HLD.   Clinical Impression   Pt was assisted for LB dressing prior to admission.  He struggled with shower and toilet transfers and was not thorough with pericare.  Educated pt and family in available DME and AE as detailed in ADL comments and extensively in fall prevention and home safety.  Recommended HHOT, family stating they would give suggestions made today a try and request [facility] if needed. Pt to discharge later today.    Follow Up Recommendations  No OT follow up    Equipment Recommendations  3 in 1 bedside comode (RW--pt's are not sturdy per wife)    Recommendations for Other Services       Precautions / Restrictions Precautions Precautions: Fall Precaution Comments: hx of PD Restrictions Weight Bearing Restrictions: No      Mobility Bed Mobility Overal bed mobility: Needs Assistance Bed Mobility: Supine to Sit     Supine to sit: Min assist;HOB elevated     General bed mobility comments: assist as he could not use R UE due to precautions  Transfers Overall transfer level: Needs assistance Equipment used: Rolling walker (2 wheeled) Transfers: Sit to/from Stand Sit to Stand: Mod assist         General transfer comment: Mod A to rise from EOB with cues for hand placement and anterior translation. Increased difficulty with transition due to weakness.     Balance Overall balance assessment: Needs assistance;History of Falls Sitting-balance support: Feet supported;No upper extremity supported Sitting balance-Leahy Scale: Fair     Standing balance support: During functional activity Standing balance-Leahy Scale: Poor Standing balance comment: Reilent on RW for support.                             ADL Overall ADL's : Needs assistance/impaired Eating/Feeding: Independent;Sitting   Grooming: Wash/dry hands;Standing;Supervision/safety   Upper Body Bathing: Set up;Sitting;With adaptive equipment   Lower Body Bathing: Supervison/ safety;Sit to/from stand;With adaptive equipment Lower Body Bathing Details (indicate cue type and reason): uses long sponge Upper Body Dressing : Sitting;Set up   Lower Body Dressing: Moderate assistance;Sit to/from stand   Toilet Transfer: Min guard;BSC;Ambulation   Toileting- Water quality scientist and Hygiene: Sit to/from stand;Minimal assistance         General ADL Comments: Pt's and family with many questions about ADL and DME.  Instructed in multiple uses of 3 in1, toilet riser, suction grab bars vs grab bar installed in wall, toilet aides, compression hose donner, reacher and sock aide. Educated in set up of 3 in1 in shower and/or over toilet if it fits in their confined bathroom space.  Cautioned pt against pulling up on shower head and shower door.      Vision     Perception     Praxis      Pertinent Vitals/Pain Pain Assessment: No/denies pain     Hand Dominance Right   Extremity/Trunk Assessment Upper Extremity Assessment Upper Extremity Assessment: Overall WFL for tasks assessed   Lower Extremity Assessment Lower Extremity Assessment: Defer to PT evaluation RLE Sensation: history of peripheral neuropathy;decreased light touch (distal to knee) LLE Sensation: history of peripheral neuropathy;decreased light touch (distal to knee)  Communication Communication Communication: No difficulties   Cognition Arousal/Alertness: Awake/alert Behavior During Therapy: WFL for tasks assessed/performed Overall Cognitive Status: History of cognitive impairments - at baseline                     General Comments       Exercises       Shoulder Instructions      Home Living Family/patient  expects to be discharged to:: Private residence Living Arrangements: Spouse/significant other Available Help at Discharge: Family;Available 24 hours/day Type of Home: House Home Access: Ramped entrance     Home Layout: One level     Bathroom Shower/Tub: Walk-in shower;Door   [employer] Toilet: Standard     Home Equipment: Environmental consultant - 2 wheels;Walker - 4 wheels;Cane - single point;Shower seat;Hand held shower head;Adaptive equipment Adaptive Equipment: Long-handled sponge        Prior Functioning/Environment Level of Independence: Needs assistance  Gait / Transfers Assistance Needed: Pt is a furniture walker at baseline or uses [place] as needed. More recently has needed RW for support. Reports multiple falls. ADL's / Homemaking Assistance Needed: assisted for LB dressing   Comments: Reports difficulty standing from surfaces.    OT Diagnosis:     OT Problem List:     OT Treatment/Interventions:      OT Goals(Current goals can be found in the care plan section) Acute Rehab OT Goals Patient Stated Goal: to get back to walking and not being afraid of falling  OT Frequency:     Barriers to D/C:            Co-evaluation              End of Session    Activity Tolerance: Patient tolerated treatment well Patient left: in bed;with call bell/phone within reach;with family/visitor present   Time: 1625-1700 OT Time Calculation (min): 35 min Charges:  OT General Charges $OT Visit: 1 Procedure OT Evaluation $Initial OT Evaluation Tier I: 1 Procedure OT Treatments $Self Care/Home Management : 8-22 mins G-Codes:     Mar 13, 2015 1700  OT G-codes **NOT FOR INPATIENT CLASS**  Functional Assessment Tool Used clinical judgement  Functional Limitation Self care  Self Care Current Status (U4403) CK  Self Care Goal Status (K7425) CK  Self Care Discharge Status (Z5638) CK      Malka So 03/13/2015, 5:11 PM  [phone]

## 2015-02-24 NOTE — Progress Notes (Signed)
    Subjective:  No chest pain or SOB. He is complaining of Rt calf pain since cath.   Objective:  Vital Signs in the last 24 hours: Temp:  [97.4 F (36.3 C)-98.1 F (36.7 C)] 97.4 F (36.3 C) (04/07 0746) Pulse Rate:  [56-81] 65 (04/07 0746) Resp:  [14-23] 21 (04/07 0800) BP: (153-223)/(63-99) 160/64 mmHg (04/07 0800) SpO2:  [93 %-98 %] 96 % (04/07 0746) Weight:  [255 lb 11.7 oz (116 kg)] 255 lb 11.7 oz (116 kg) (04/07 0030)  Intake/Output from previous day:  Intake/Output Summary (Last 24 hours) at 02/24/15 1004 Last data filed at 02/24/15 0900  Gross per 24 hour  Intake 2339.17 ml  Output   2025 ml  Net 314.17 ml    Physical Exam: General appearance: alert, cooperative, no distress and moderately obese Lungs: clear to auscultation bilaterally Heart: regular rate and rhythm Extremities: Rt wrist without hematoma, Rt calf pain, chronic edema   Rate: 65  Rhythm: normal sinus rhythm  Lab Results:  Recent Labs  02/23/15 1015 02/24/15 0511  WBC 11.1* 11.6*  HGB 14.3 13.8  PLT 219 219    Recent Labs  02/23/15 1015 02/24/15 0511  NA 139 139  K 3.5 3.0*  CL 101 103  CO2 27 27  GLUCOSE 347* 199*  BUN 10 10  CREATININE 1.00 1.00   No results for input(s): TROPONINI in the last 72 hours.  Invalid input(s): CK, MB  Recent Labs  02/23/15 1015  INR 0.95    Imaging: Imaging results have been reviewed  Cardiac Studies:  Assessment/Plan:  69 year old male who was [facility] in the [facility] clinic for followup visit on an urgent basis due to chest pain. He has known history of coronary artery disease status post multiple PCI in the past at [facility]. He presented in April of 2013 with chest pain. He underwent cardiac catheterization which showed patent stents. However, there was a 90% stenosis in the proximal first diagonal. There was 40% disease in the mid LAD and moderate disease in the left circumflex. He underwent an angioplasty and drug-eluting stent  placement to the diagonal without complications. he has known history of refractory hypertension. Renal artery duplex ultrasound showed no evidence of renal artery stenosis. The gave a history of substernal chest tightness over the last 2 weeks which has worsened recently. This is similar to his previous angina. This is now happening at rest and sometimes it lasted for 30 minutes. It only responded partially to nitroglycerin on Sunday and he went to the emergency room at [facility]. ECG showed lateral T wave changes. Cardiac enzymes were unremarkable. [name] saw him in the office 02/22/15 and set him up for a cath 4/6.   Principal Problem:   Unstable angina Active Problems:   CAD (coronary artery disease)   Hypertension   DM (diabetes mellitus), type 2 with neurological complications   Pain of left calf-r/o DVT   Hyperlipidemia   Obesity-BMI 35   PLAN: Check LE dopplers. Add Hydralazine for HTN.    Steve Ransom PA-C [facility] [phone] 02/24/2015, 10:04 AM

## 2015-02-24 NOTE — Discharge Summary (Signed)
Patient ID: Steve Phillips,  MRN: [id number], DOB/AGE: May 14, 1946 69 y.o.  Admit date: 02/23/2015 Discharge date: 02/24/2015  Primary Care Provider: [name], MD Primary Cardiologist: [name]  Discharge Diagnoses Principal Problem:   Unstable angina Active Problems:   CAD (coronary artery disease)   Hypertension   DM (diabetes mellitus), type 2 with neurological complications   Pain of left calf-r/o DVT   Hyperlipidemia   Obesity-BMI 35    Procedures:  Mid LAD DES 02/23/15   Hospital Course:  69 year old male who was seen in the [facility] clinic for followup visit on an urgent basis due to chest pain. He has known history of coronary artery disease status post multiple PCI in the past at [facility]. He presented in April of 2013 with chest pain. He underwent cardiac catheterization which showed patent stents. However, there was a 90% stenosis in the proximal first diagonal. There was 40% disease in the mid LAD and moderate disease in the left circumflex. He underwent an angioplasty and drug-eluting stent placement to the diagonal without complications. he has known history of refractory hypertension. Renal artery duplex ultrasound showed no evidence of renal artery stenosis. The gave a history of substernal chest tightness over the last 2 weeks which has worsened recently. This is similar to his previous angina. This is now happening at rest and sometimes it lasted for 30 minutes. It only responded partially to nitroglycerin on Sunday and he went to the emergency room at [facility]. ECG showed lateral T wave changes. Cardiac enzymes were unremarkable. [name] saw him in the office 02/22/15 and set him up for a cath 4/6. Cath revealed patent stents in the RCA,CFX, and Dx1. He had a significant stenosis in the mid LAD that was intervened on and stented with a DES. Post procedure he complained of Lt calf pain and swelling. Venous doppler done was negative DVT. The family requested  evaluation for home health PT and this will be arranged.   Discharge Vitals:  Blood pressure 160/64, pulse 65, temperature 97.4 F (36.3 C), temperature source Oral, resp. rate 21, height 6' (1.829 m), weight 255 lb 11.7 oz (116 kg), SpO2 96 %.    Labs: Results for orders placed or performed during the hospital encounter of 02/23/15 (from the past 24 hour(s))  POCT Activated clotting time     Status: None   Collection Time: 02/23/15  1:28 PM  Result Value Ref Range   Activated Clotting Time 601 seconds  Glucose, capillary     Status: Abnormal   Collection Time: 02/23/15  3:05 PM  Result Value Ref Range   Glucose-Capillary 263 (H) 70 - 99 mg/dL   Comment 1 Notify RN   Glucose, capillary     Status: Abnormal   Collection Time: 02/23/15  6:04 PM  Result Value Ref Range   Glucose-Capillary 340 (H) 70 - 99 mg/dL  Glucose, capillary     Status: Abnormal   Collection Time: 02/23/15  9:45 PM  Result Value Ref Range   Glucose-Capillary 270 (H) 70 - 99 mg/dL   Comment 1 Notify RN    Comment 2 Document in Chart   Basic metabolic panel     Status: Abnormal   Collection Time: 02/24/15  5:11 AM  Result Value Ref Range   Sodium 139 135 - 145 mmol/L   Potassium 3.0 (L) 3.5 - 5.1 mmol/L   Chloride 103 96 - 112 mmol/L   CO2 27 19 - 32 mmol/L   Glucose,  Bld 199 (H) 70 - 99 mg/dL   BUN 10 6 - 23 mg/dL   Creatinine, Ser 1.00 0.50 - 1.35 mg/dL   Calcium 8.4 8.4 - 10.5 mg/dL   GFR calc non Af Amer 74 (L) >90 mL/min   GFR calc Af Amer 86 (L) >90 mL/min   Anion gap 9 5 - 15  CBC     Status: Abnormal   Collection Time: 02/24/15  5:11 AM  Result Value Ref Range   WBC 11.6 (H) 4.0 - 10.5 K/uL   RBC 5.68 4.22 - 5.81 MIL/uL   Hemoglobin 13.8 13.0 - 17.0 g/dL   HCT 42.8 39.0 - 52.0 %   MCV 75.4 (L) 78.0 - 100.0 fL   MCH 24.3 (L) 26.0 - 34.0 pg   MCHC 32.2 30.0 - 36.0 g/dL   RDW 16.4 (H) 11.5 - 15.5 %   Platelets 219 150 - 400 K/uL  Glucose, capillary     Status: Abnormal   Collection Time:  02/24/15  6:55 AM  Result Value Ref Range   Glucose-Capillary 176 (H) 70 - 99 mg/dL   Comment 1 Notify RN    Comment 2 Document in Chart     Disposition:  Follow-up Information    Follow up with [name], MD On 03/07/2015.   Specialty:  Cardiology   Why:  10:15   Contact information:   [place] [address] [phone]       Discharge Medications:    Medication List    TAKE these medications        acetaminophen 325 MG tablet  Commonly known as:  TYLENOL  Take 2 tablets (650 mg total) by mouth every 4 (four) hours as needed for headache or mild pain.     amLODipine 5 MG tablet  Commonly known as:  NORVASC  Take 1 tablet (5 mg total) by mouth daily.     aspirin 81 MG tablet  Take 81 mg by mouth 3 (three) times daily.     carbidopa-levodopa 25-100 MG per tablet  Commonly known as:  SINEMET IR  Take 2 tablets by mouth 3 (three) times daily.     carvedilol 25 MG tablet  Commonly known as:  COREG  TAKE ONE TABLET BY MOUTH TWICE DAILY     clopidogrel 75 MG tablet  Commonly known as:  PLAVIX  Take 1 tablet (75 mg total) by mouth daily with breakfast.     cyanocobalamin 1000 MCG/ML injection  Commonly known as:  (VITAMIN B-12)  Inject 1,000 mcg into the muscle once.     DULoxetine 60 MG capsule  Commonly known as:  CYMBALTA  TAKE 1 CAPSULE DAILY     glucose blood test strip  Commonly known as:  FREESTYLE TEST STRIPS  Use to check blood sugar up to three times daily     hydrALAZINE 25 MG tablet  Commonly known as:  APRESOLINE  Take 1 tablet (25 mg total) by mouth 2 (two) times daily.     HYDROcodone-acetaminophen 5-325 MG per tablet  Commonly known as:  NORCO/VICODIN  Take 1-2 tablets by mouth every 8 (eight) hours as needed.     insulin aspart 100 UNIT/ML injection  Commonly known as:  NOVOLOG  Inject 48-50 units 3 times a day with meals as instructed.     Insulin Glargine 300 UNIT/ML Sopn  Commonly known as:  TOUJEO  SOLOSTAR  Inject 55 Units into the skin 2 (two) times daily at 8 am  and 10 pm.     lisinopril-hydrochlorothiazide 20-12.5 MG per tablet  Commonly known as:  PRINZIDE,ZESTORETIC  Take 1 tablet by mouth 2 (two) times daily.     metFORMIN 1000 MG tablet  Commonly known as:  GLUCOPHAGE  TAKE ONE TABLET BY MOUTH TWICE DAILY  Start taking on:  02/26/2015     nitroGLYCERIN 0.4 MG SL tablet  Commonly known as:  NITROSTAT  Place 1 tablet (0.4 mg total) under the tongue every 5 (five) minutes as needed for chest pain.     pantoprazole 40 MG tablet  Commonly known as:  PROTONIX  Take 1 tablet (40 mg total) by mouth 2 (two) times daily.     pregabalin 150 MG capsule  Commonly known as:  LYRICA  Take 1 capsule (150 mg total) by mouth 2 (two) times daily.     rosuvastatin 20 MG tablet  Commonly known as:  CRESTOR  Take 1 tablet (20 mg total) by mouth daily at 6 PM.     tiZANidine 4 MG tablet  Commonly known as:  ZANAFLEX  TAKE ONE TABLET BY MOUTH EVERY 8 HOURS AS NEEDED FOR PAIN         Duration of Discharge Encounter: Greater than 30 minutes including physician time.  Angelena Form PA-C 02/24/2015 12:16 PM

## 2015-02-25 ENCOUNTER — Telehealth: Payer: Self-pay

## 2015-02-25 ENCOUNTER — Other Ambulatory Visit: Payer: Self-pay | Admitting: *Deleted

## 2015-02-25 MED ORDER — ROSUVASTATIN CALCIUM 20 MG PO TABS: 20.0000 mg | ORAL_TABLET | Freq: Every day | ORAL | Status: DC

## 2015-02-25 MED ORDER — SIMVASTATIN 40 MG PO TABS: 40.0000 mg | ORAL_TABLET | Freq: Every day | ORAL | Status: DC

## 2015-02-25 MED ORDER — HYDRALAZINE HCL 25 MG PO TABS: 25.0000 mg | ORAL_TABLET | Freq: Two times a day (BID) | ORAL | Status: DC

## 2015-02-25 NOTE — Telephone Encounter (Signed)
Switch to Simvastatin 40 mg once daily.

## 2015-02-25 NOTE — Telephone Encounter (Signed)
Pt wife would like a different rx for Crestor, due to no generic and too expensive. she requests Simvastatin. Please call and advise

## 2015-02-25 NOTE — Telephone Encounter (Signed)
Spoke w/ pt's wife.  Advised her of Dr. Tyrell Antonio recommendation.  Advised her that when I try to send in simvastatin rx, there is an allergy alert w/ atorvastatin.  She asks me to remove this from his allergy list, as she is unsure if this caused his leg cramps many years ago.  She states that he is on so many meds that they are becoming hard to afford and she requests that the simvastatin be sent in. Asked her to call back w/ any questions or concerns.

## 2015-02-26 ENCOUNTER — Emergency Department
Admit: 2015-02-26 | Disposition: A | Discharge status: Home / Self Care | Payer: Self-pay | Admitting: Emergency Medicine

## 2015-02-26 DIAGNOSIS — F329 Major depressive disorder, single episode, unspecified: Secondary | ICD-10-CM | POA: Diagnosis not present

## 2015-02-26 DIAGNOSIS — Z95818 Presence of other cardiac implants and grafts: Secondary | ICD-10-CM | POA: Diagnosis not present

## 2015-02-26 DIAGNOSIS — M792 Neuralgia and neuritis, unspecified: Secondary | ICD-10-CM | POA: Diagnosis not present

## 2015-02-26 DIAGNOSIS — Z7902 Long term (current) use of antithrombotics/antiplatelets: Secondary | ICD-10-CM | POA: Diagnosis not present

## 2015-02-26 DIAGNOSIS — G20 Parkinson's disease: Secondary | ICD-10-CM | POA: Diagnosis not present

## 2015-02-26 DIAGNOSIS — Z8673 Personal history of transient ischemic attack (TIA), and cerebral infarction without residual deficits: Secondary | ICD-10-CM | POA: Diagnosis not present

## 2015-02-26 DIAGNOSIS — M79605 Pain in left leg: Secondary | ICD-10-CM | POA: Diagnosis not present

## 2015-02-26 DIAGNOSIS — I10 Essential (primary) hypertension: Secondary | ICD-10-CM | POA: Diagnosis not present

## 2015-02-26 DIAGNOSIS — E119 Type 2 diabetes mellitus without complications: Secondary | ICD-10-CM | POA: Diagnosis not present

## 2015-02-26 DIAGNOSIS — Z79899 Other long term (current) drug therapy: Secondary | ICD-10-CM | POA: Diagnosis not present

## 2015-02-26 DIAGNOSIS — Z87891 Personal history of nicotine dependence: Secondary | ICD-10-CM | POA: Diagnosis not present

## 2015-02-26 DIAGNOSIS — I2510 Atherosclerotic heart disease of native coronary artery without angina pectoris: Secondary | ICD-10-CM | POA: Diagnosis not present

## 2015-02-26 DIAGNOSIS — R296 Repeated falls: Secondary | ICD-10-CM | POA: Diagnosis not present

## 2015-02-26 DIAGNOSIS — G629 Polyneuropathy, unspecified: Secondary | ICD-10-CM | POA: Diagnosis not present

## 2015-02-26 DIAGNOSIS — Z794 Long term (current) use of insulin: Secondary | ICD-10-CM | POA: Diagnosis not present

## 2015-02-26 LAB — CBC WITH DIFFERENTIAL/PLATELET
Basophil #: 0.1 10*3/uL (ref 0.0–0.1)
Basophil %: 1.1 %
Eosinophil #: 0.4 10*3/uL (ref 0.0–0.7)
Eosinophil %: 3 %
HCT: 44.1 % (ref 40.0–52.0)
HGB: 14.2 g/dL (ref 13.0–18.0)
Lymphocyte #: 1.9 10*3/uL (ref 1.0–3.6)
Lymphocyte %: 14.7 %
MCH: 23.9 pg — ABNORMAL LOW (ref 26.0–34.0)
MCHC: 32.3 g/dL (ref 32.0–36.0)
MCV: 74 fL — ABNORMAL LOW (ref 80–100)
MONOS PCT: 6.9 %
Monocyte #: 0.9 x10 3/mm (ref 0.2–1.0)
NEUTROS ABS: 9.7 10*3/uL — AB (ref 1.4–6.5)
Neutrophil %: 74.3 %
Platelet: 245 10*3/uL (ref 150–440)
RBC: 5.96 10*6/uL — ABNORMAL HIGH (ref 4.40–5.90)
RDW: 17.2 % — ABNORMAL HIGH (ref 11.5–14.5)
WBC: 13 10*3/uL — ABNORMAL HIGH (ref 3.8–10.6)

## 2015-02-26 LAB — COMPREHENSIVE METABOLIC PANEL
Albumin: 3.5 g/dL
Alkaline Phosphatase: 63 U/L
Anion Gap: 8 (ref 7–16)
BILIRUBIN TOTAL: 0.6 mg/dL
BUN: 13 mg/dL
CALCIUM: 8.5 mg/dL — AB
CHLORIDE: 102 mmol/L
Co2: 26 mmol/L
Creatinine: 0.94 mg/dL
GLUCOSE: 262 mg/dL — AB
Potassium: 3.4 mmol/L — ABNORMAL LOW
SGOT(AST): 27 U/L
SGPT (ALT): 7 U/L — ABNORMAL LOW
SODIUM: 136 mmol/L
Total Protein: 6.3 g/dL — ABNORMAL LOW

## 2015-02-26 LAB — CK: CK, Total: 95 U/L

## 2015-02-28 DIAGNOSIS — M5416 Radiculopathy, lumbar region: Secondary | ICD-10-CM | POA: Diagnosis not present

## 2015-02-28 DIAGNOSIS — M5136 Other intervertebral disc degeneration, lumbar region: Secondary | ICD-10-CM | POA: Diagnosis not present

## 2015-03-01 ENCOUNTER — Ambulatory Visit
Admit: 2015-03-01 | Disposition: A | Discharge status: Home / Self Care | Payer: Self-pay | Attending: Physical Medicine and Rehabilitation | Admitting: Physical Medicine and Rehabilitation

## 2015-03-01 DIAGNOSIS — M5126 Other intervertebral disc displacement, lumbar region: Secondary | ICD-10-CM | POA: Diagnosis not present

## 2015-03-02 DIAGNOSIS — M5136 Other intervertebral disc degeneration, lumbar region: Secondary | ICD-10-CM | POA: Diagnosis not present

## 2015-03-02 DIAGNOSIS — M5416 Radiculopathy, lumbar region: Secondary | ICD-10-CM | POA: Diagnosis not present

## 2015-03-03 ENCOUNTER — Emergency Department
Admit: 2015-03-03 | Disposition: A | Discharge status: Home / Self Care | Payer: Self-pay | Admitting: Emergency Medicine

## 2015-03-03 DIAGNOSIS — M25562 Pain in left knee: Secondary | ICD-10-CM | POA: Diagnosis not present

## 2015-03-03 DIAGNOSIS — R7309 Other abnormal glucose: Secondary | ICD-10-CM | POA: Diagnosis not present

## 2015-03-03 DIAGNOSIS — M79605 Pain in left leg: Secondary | ICD-10-CM | POA: Diagnosis not present

## 2015-03-03 DIAGNOSIS — I10 Essential (primary) hypertension: Secondary | ICD-10-CM | POA: Diagnosis not present

## 2015-03-03 DIAGNOSIS — R609 Edema, unspecified: Secondary | ICD-10-CM | POA: Diagnosis not present

## 2015-03-03 DIAGNOSIS — M79609 Pain in unspecified limb: Secondary | ICD-10-CM | POA: Diagnosis not present

## 2015-03-03 DIAGNOSIS — M549 Dorsalgia, unspecified: Secondary | ICD-10-CM | POA: Diagnosis not present

## 2015-03-03 DIAGNOSIS — F17200 Nicotine dependence, unspecified, uncomplicated: Secondary | ICD-10-CM | POA: Diagnosis not present

## 2015-03-04 ENCOUNTER — Inpatient Hospital Stay
Admit: 2015-03-04 | Disposition: A | Discharge status: Home / Self Care | Payer: Self-pay | Attending: Internal Medicine | Admitting: Internal Medicine

## 2015-03-04 DIAGNOSIS — K760 Fatty (change of) liver, not elsewhere classified: Secondary | ICD-10-CM | POA: Diagnosis not present

## 2015-03-04 DIAGNOSIS — I700 Atherosclerosis of aorta: Secondary | ICD-10-CM | POA: Diagnosis not present

## 2015-03-04 DIAGNOSIS — Z7902 Long term (current) use of antithrombotics/antiplatelets: Secondary | ICD-10-CM | POA: Diagnosis not present

## 2015-03-04 DIAGNOSIS — I672 Cerebral atherosclerosis: Secondary | ICD-10-CM | POA: Diagnosis not present

## 2015-03-04 DIAGNOSIS — Z794 Long term (current) use of insulin: Secondary | ICD-10-CM | POA: Diagnosis not present

## 2015-03-04 DIAGNOSIS — E1140 Type 2 diabetes mellitus with diabetic neuropathy, unspecified: Secondary | ICD-10-CM | POA: Diagnosis not present

## 2015-03-04 DIAGNOSIS — E1121 Type 2 diabetes mellitus with diabetic nephropathy: Secondary | ICD-10-CM | POA: Diagnosis not present

## 2015-03-04 DIAGNOSIS — G20 Parkinson's disease: Secondary | ICD-10-CM | POA: Diagnosis present

## 2015-03-04 DIAGNOSIS — G319 Degenerative disease of nervous system, unspecified: Secondary | ICD-10-CM | POA: Diagnosis not present

## 2015-03-04 DIAGNOSIS — M5136 Other intervertebral disc degeneration, lumbar region: Secondary | ICD-10-CM | POA: Diagnosis not present

## 2015-03-04 DIAGNOSIS — M545 Low back pain: Secondary | ICD-10-CM | POA: Diagnosis not present

## 2015-03-04 DIAGNOSIS — M5127 Other intervertebral disc displacement, lumbosacral region: Secondary | ICD-10-CM | POA: Diagnosis not present

## 2015-03-04 DIAGNOSIS — R51 Headache: Secondary | ICD-10-CM | POA: Diagnosis present

## 2015-03-04 DIAGNOSIS — M549 Dorsalgia, unspecified: Secondary | ICD-10-CM | POA: Diagnosis not present

## 2015-03-04 DIAGNOSIS — I2510 Atherosclerotic heart disease of native coronary artery without angina pectoris: Secondary | ICD-10-CM | POA: Diagnosis not present

## 2015-03-04 DIAGNOSIS — I6782 Cerebral ischemia: Secondary | ICD-10-CM | POA: Diagnosis not present

## 2015-03-04 DIAGNOSIS — Z9049 Acquired absence of other specified parts of digestive tract: Secondary | ICD-10-CM | POA: Diagnosis not present

## 2015-03-04 DIAGNOSIS — M25562 Pain in left knee: Secondary | ICD-10-CM | POA: Diagnosis not present

## 2015-03-04 DIAGNOSIS — I952 Hypotension due to drugs: Secondary | ICD-10-CM | POA: Diagnosis not present

## 2015-03-04 DIAGNOSIS — Z955 Presence of coronary angioplasty implant and graft: Secondary | ICD-10-CM | POA: Diagnosis not present

## 2015-03-04 DIAGNOSIS — K219 Gastro-esophageal reflux disease without esophagitis: Secondary | ICD-10-CM | POA: Diagnosis not present

## 2015-03-04 DIAGNOSIS — Z7982 Long term (current) use of aspirin: Secondary | ICD-10-CM | POA: Diagnosis not present

## 2015-03-04 DIAGNOSIS — I10 Essential (primary) hypertension: Secondary | ICD-10-CM | POA: Diagnosis not present

## 2015-03-04 DIAGNOSIS — M5416 Radiculopathy, lumbar region: Secondary | ICD-10-CM | POA: Diagnosis not present

## 2015-03-04 DIAGNOSIS — E785 Hyperlipidemia, unspecified: Secondary | ICD-10-CM | POA: Diagnosis not present

## 2015-03-04 DIAGNOSIS — F17200 Nicotine dependence, unspecified, uncomplicated: Secondary | ICD-10-CM | POA: Diagnosis not present

## 2015-03-04 LAB — BASIC METABOLIC PANEL
ANION GAP: 8 (ref 7–16)
BUN: 13 mg/dL
CALCIUM: 9 mg/dL
CO2: 26 mmol/L
Chloride: 103 mmol/L
Creatinine: 0.93 mg/dL
EGFR (African American): >60
EGFR (Non-African Amer.): >60
Glucose: 219 mg/dL — ABNORMAL HIGH
POTASSIUM: 3.5 mmol/L
Sodium: 137 mmol/L

## 2015-03-04 LAB — CK
CK, Total: 68 U/L
CK, Total: 61 U/L

## 2015-03-04 LAB — CK-MB: CK-MB: 2.8 ng/mL

## 2015-03-04 LAB — CBC
HCT: 47.3 % (ref 40.0–52.0)
HGB: 15.2 g/dL (ref 13.0–18.0)
MCH: 23.9 pg — ABNORMAL LOW (ref 26.0–34.0)
MCHC: 32 g/dL (ref 32.0–36.0)
MCV: 75 fL — ABNORMAL LOW (ref 80–100)
Platelet: 263 10*3/uL (ref 150–440)
RBC: 6.33 10*6/uL — ABNORMAL HIGH (ref 4.40–5.90)
RDW: 17.5 % — AB (ref 11.5–14.5)
WBC: 14 10*3/uL — ABNORMAL HIGH (ref 3.8–10.6)

## 2015-03-04 LAB — TROPONIN I
Troponin-I: <0.03 ng/mL
Troponin-I: <0.03 ng/mL
Troponin-I: <0.03 ng/mL

## 2015-03-05 LAB — CBC WITH DIFFERENTIAL/PLATELET
Basophil #: 0.1 10*3/uL (ref 0.0–0.1)
Basophil %: 0.4 %
EOS PCT: 0.1 %
Eosinophil #: 0 10*3/uL (ref 0.0–0.7)
HCT: 43.3 % (ref 40.0–52.0)
HGB: 13.8 g/dL (ref 13.0–18.0)
LYMPHS ABS: 0.8 10*3/uL — AB (ref 1.0–3.6)
Lymphocyte %: 6.4 %
MCH: 24.1 pg — AB (ref 26.0–34.0)
MCHC: 31.9 g/dL — ABNORMAL LOW (ref 32.0–36.0)
MCV: 75 fL — AB (ref 80–100)
Monocyte #: 0.1 x10 3/mm — ABNORMAL LOW (ref 0.2–1.0)
Monocyte %: 1 %
NEUTROS ABS: 11.8 10*3/uL — AB (ref 1.4–6.5)
Neutrophil %: 92.1 %
Platelet: 260 10*3/uL (ref 150–440)
RBC: 5.75 10*6/uL (ref 4.40–5.90)
RDW: 17.5 % — ABNORMAL HIGH (ref 11.5–14.5)
WBC: 12.8 10*3/uL — AB (ref 3.8–10.6)

## 2015-03-05 LAB — BASIC METABOLIC PANEL
Anion Gap: 7 (ref 7–16)
BUN: 16 mg/dL
CREATININE: 1.33 mg/dL — AB
Calcium, Total: 8.3 mg/dL — ABNORMAL LOW
Chloride: 103 mmol/L
Co2: 26 mmol/L
EGFR (African American): >60
EGFR (Non-African Amer.): 54 — ABNORMAL LOW
GLUCOSE: 387 mg/dL — AB
POTASSIUM: 4.3 mmol/L
SODIUM: 136 mmol/L

## 2015-03-05 LAB — CK: CK, Total: 41 U/L — ABNORMAL LOW

## 2015-03-05 LAB — TSH: THYROID STIMULATING HORM: 0.875 u[IU]/mL

## 2015-03-05 LAB — LIPID PANEL
Cholesterol: 94 mg/dL
HDL Cholesterol: 30 mg/dL — ABNORMAL LOW
Ldl Cholesterol, Calc: 50 mg/dL
Triglycerides: 70 mg/dL
VLDL Cholesterol, Calc: 14 mg/dL

## 2015-03-05 LAB — TROPONIN I

## 2015-03-05 LAB — HEMOGLOBIN A1C: Hemoglobin A1C: 8.7 % — ABNORMAL HIGH

## 2015-03-07 ENCOUNTER — Telehealth: Payer: Self-pay | Admitting: *Deleted

## 2015-03-07 ENCOUNTER — Telehealth: Payer: Self-pay | Admitting: Cardiology

## 2015-03-07 ENCOUNTER — Encounter: Payer: Medicare Other | Admitting: Cardiovascular Disease

## 2015-03-07 ENCOUNTER — Telehealth: Payer: Self-pay

## 2015-03-07 NOTE — Telephone Encounter (Signed)
Pt needing cardiac clearance due to pt being on Plavix. Pt has question about being able to hold Plavix in order to have procedure with Dr. Margette Fast. Please advise

## 2015-03-07 NOTE — Telephone Encounter (Signed)
Spoke w/ pt's wife.  She states that pt will need a nerve block or back surgery, but docs will not do anything while pt is on Plavix. Dr. Sharlet Salina' office would like to hold Plavix x 10 days. Pt has been hospitalized several times recently, coded at [facility] yesterday, but d/c'd today.  Reports that pain meds will not touch his pain, as "it's not in the nerves".  She would like to know if Dr. Fletcher [name] will ok pt to hold Plavix for procedure, as she states she was told he would have to always be on it.  She verbalizes frustration on the phone, stating that she is worried about her husband.

## 2015-03-07 NOTE — Telephone Encounter (Signed)
They are calling for they got a RX on April 8th, They have questions on the ones we sent in.  Please call for it has been two weeks and no one has called them.

## 2015-03-07 NOTE — Telephone Encounter (Signed)
Pt wife calling stating that pt is being discharged from hospital, and is letting us know.  It started out that he had leg pain, come to find out. That he has back problems that is causing leg pains. Just wanted [place] to know.  Would like [place] to call them. She just wanted to talk to nurse.

## 2015-03-07 NOTE — Telephone Encounter (Signed)
The patient is being discharged from [facility] today and has scheduled a hospital follow up for Friday (4/22)

## 2015-03-08 MED ORDER — SIMVASTATIN 20 MG PO TABS: 20.0000 mg | ORAL_TABLET | Freq: Every day | ORAL | Status: AC

## 2015-03-08 NOTE — Telephone Encounter (Signed)
New Rx sent for simvastatin 20 mg one tablet daily at bedtime.  Faxed a correction for simvastatin 20 mg as well to [facility].

## 2015-03-08 NOTE — Telephone Encounter (Signed)
Forward call to [facility] office. Per last notation 03/07/15 [facility] contacted  the [facility] office concerning prescription.

## 2015-03-08 NOTE — Telephone Encounter (Signed)
Spoke with Horris Latino (wife) due to a drug interaction with simvastatin 40 mg and amlodipine 5 mg, Dr. Fletcher [name] would like the patient to decrease the simvastatin to 20 mg one tablet at bedtime.

## 2015-03-08 NOTE — Consult Note (Signed)
General Aspect 69 year old gentleman with extensive cardiac history,  chronic ischemic heart disease, multiple PCI's in the past starting about 12 years ago, last in april 2013 with DES stent to teh ostial diagonal vessel,   presenting today with severe chest pain that started at noon. Cardiology was consulted for chest pain in setting of known severe CAD.  he reports chest pain started actutely, was severe and did not resolved with NTG x 3. He did have vague episode of chest pain recently though not this severe. Tightness was substernal and left-sided without radiation. It was associated with significant dyspnea but no diaphoresis. . The patient continued to have chest pain for hours.   In the ER, he required NTG infusion, morphine for sx. His cardiac enzymes have been negative.  EKG changes noted with new anterolateral leads ST and T wave ABN V3 to V6, I and AVL.    Present Illness . SOCIAL HISTORY: No smoking. No alcohol. No drug use. Retired from [employer].  FAMILY HISTORY: Mother died of pneumonia, had breast cancer, diabetes, and hypertension. Father died of throat cancer.   Physical Exam:   GEN well developed, well nourished, no acute distress    HEENT red conjunctivae    NECK supple  No masses    RESP normal resp effort  clear BS    CARD Regular rate and rhythm    ABD denies tenderness  normal BS    LYMPH negative axillae    EXTR positive edema, trace Left > right    SKIN normal to palpation    NEURO motor/sensory function intact    PSYCH alert, A+O to time, place, person, good insight   Review of Systems:   Subjective/Chief Complaint chest pain, now resolved ("2/10")    General: No Complaints    Skin: No Complaints    ENT: No Complaints    Eyes: No Complaints    Neck: No Complaints    Respiratory: No Complaints    Cardiovascular: Chest pain or discomfort    Gastrointestinal: No Complaints    Genitourinary: No Complaints    Vascular: No Complaints     Musculoskeletal: No Complaints    Neurologic: No Complaints    Hematologic: No Complaints    Endocrine: No Complaints    Psychiatric: No Complaints    Review of Systems: All other systems were reviewed and found to be negative    Medications/Allergies Reviewed Medications/Allergies reviewed     CAD:    GERD - Esophageal Reflux:    Diabetes:    Hypertension:    Cardiac stents x 4:    Appendectomy:    left foot reconstruction from break:    Cholecystectomy:   Home Medications: Medication Instructions Status  metformin 1000 mg oral tablet 1 tab(s) orally 2 times a day  Active  Zestoretic 20 mg-25 mg oral tablet 1 tab(s) orally 2 times a day Active  Imdur 60 mg oral tablet, extended release 1 tab(s) orally once a day (at bedtime) Active  amlodipine 10 mg oral tablet 1 tab(s) orally once a day (in the morning) Active  Crestor 10 mg oral tablet 1 tab(s) orally once a day (at bedtime) Active  aspirin 81 mg oral tablet 1 tab(s) orally once a day (in the morning), and 2 tabs orally once a day (at bedtime) Active  carvedilol 25 mg oral tablet 1 tab(s) orally 2 times a day Active  Cymbalta 60 mg oral delayed release capsule 1 cap(s) orally once a day (in the  morning) Active  Lantus 100 units/mL subcutaneous solution 80 unit(s) subcutaneous 2 times a day Active  nitroglycerin 0.4 mg sublingual tablet 1 tab(s) sublingual every 5 minutes up to 3 doses as needed for chest pain. *if no relief call md or go to emergency room* Active  NovoLog 100 units/mL subcutaneous solution sliding scale subcutaneous 3 times a day (with meals) Active  Plavix 75 mg oral tablet 1 tab(s) orally once a day (in the morning). Active  pantoprazole 40 mg oral delayed release tablet 1 tab(s) orally once a day (in the morning) Active  tizanidine 4 mg oral tablet 1 tab(s) orally once a day (at bedtime) Active  Lyrica 75 mg oral capsule 1 cap(s) orally once a day (in the morning) Active   Lab Results:   Hepatic:  16-Nov-13 18:09    Bilirubin, Total 0.7   Alkaline Phosphatase 77   SGPT (ALT) 25   SGOT (AST) 23   Total Protein, Serum 7.2   Albumin, Serum 3.6   Bilirubin, Direct 0.2 (Result(s) reported on 04 Oct 2012 at 07:46PM.)  Routine Chem:  16-Nov-13 18:09    Glucose, Serum  409   BUN 10   Creatinine (comp) 0.91   Sodium, Serum 136   Potassium, Serum 3.7   Chloride, Serum 102   CO2, Serum 27   Calcium (Total), Serum  8.4   Osmolality (calc) 288   eGFR (African American) >60   eGFR (Non-African American) >60 (eGFR values <33m/min/1.73 m2 may be an indication of chronic kidney disease (CKD). Calculated eGFR is useful in patients with stable renal function. The eGFR calculation will not be reliable in acutely ill patients when serum creatinine is changing rapidly. It is not useful in  patients on dialysis. The eGFR calculation may not be applicable to patients at the low and high extremes of body sizes, pregnant women, and vegetarians.)   Anion Gap 7   Lipase 126 (Result(s) reported on 04 Oct 2012 at 07:46PM.)   B-Type Natriuretic Peptide ([facility] 49 (Result(s) reported on 04 Oct 2012 at 06:53PM.)  Cardiac:  16-Nov-13 18:09    Troponin I < 0.02 (0.00-0.05 0.05 ng/mL or less: NEGATIVE  Repeat testing in 3-6 hrs  if clinically indicated. >0.05 ng/mL: POTENTIAL  MYOCARDIAL INJURY. Repeat  testing in 3-6 hrs if  clinically indicated. NOTE: An increase or decrease  of 30% or more on serial  testing suggests a  clinically important change)   CK, Total 101   CPK-MB, Serum 2.3 (Result(s) reported on 04 Oct 2012 at 06:51PM.)  Routine Coag:  16-Nov-13 18:09    Activated PTT (APTT) 28.0 (A HCT value >55% may artifactually increase the APTT. In one study, the increase was an average of 19%. Reference: "Effect on Routine and Special Coagulation Testing Values of Citrate Anticoagulant Adjustment in Patients with High HCT Values." American Journal of Clinical Pathology  2006;126:400-405.)  Routine Hem:  16-Nov-13 18:09    WBC (CBC)  14.4   RBC (CBC) 5.81   Hemoglobin (CBC) 14.6   Hematocrit (CBC) 44.7   Platelet Count (CBC) 234 (Result(s) reported on 04 Oct 2012 at 06:33PM.)   MCV  77   MCH  25.1   MCHC 32.6   RDW  16.0   EKG:   Interpretation ekg shows NSR with rate 89 bpm, ST and T wave ABN in V3 to V6, I and AVL (new from prior EKG)    Codeine: N/V  Vicodin: Headaches  Vital Signs/Nurse's Notes: **Vital Signs.::   00-TMA-26  22:00   Vital Signs Type Admission   Temperature Temperature (F) 98.4   Celsius 36.8   Temperature Source oral   Pulse Pulse 84   Respirations Respirations 28   Systolic BP Systolic BP 818   Diastolic BP (mmHg) Diastolic BP (mmHg) 78   Mean BP 117   Pulse Ox % Pulse Ox % 99   Pulse Ox Activity Level  At rest   Oxygen Delivery 4L; Nasal Cannula   Pulse Ox Heart Rate 107     Impression 69 yo male with h/o CAD, previous stenting, diabetes, HTN, hyperlipidemia presenting with chest pain.  IMPRESSION:  1. Unstable angina with known history of coronary artery disease.  ----PCI with DES to diagonal vessel april 2013 --Continue aspirin 325 mg po daily with plavix start lovenox BID on NTG infusion currently pain free Given severity of sx, new EKG changes, may need to consider cardiac cath to confirm stents are patent. He and his family are very concerned. sx worse than previkous angina  2. Hypertension  on NTG infusion could continue outpt meds   3. Type 2 diabetes.  Continue outpt meds   Electronic Signatures: Ida Rogue (MD)  (Signed [phone] 11:34)  Authored: General Aspect/Present Illness, History and Physical Exam, Review of System, Past Medical History, Home Medications, Labs, EKG , Allergies, Vital Signs/Nurse's Notes, Impression/Plan   Last Updated: 17-Nov-13 11:34 by Ida Rogue (MD)

## 2015-03-08 NOTE — Discharge Summary (Signed)
PATIENT NAME:  Steve Phillips, BURZYNSKI MR#:  [id number] DATE OF BIRTH:  1946-07-20  DATE OF ADMISSION:  10/04/2012 DATE OF DISCHARGE:  10/06/2012  ADMITTING DIAGNOSIS: Unstable angina.  DISCHARGE DIAGNOSES:  1. Unstable angina, negative cardiac enzymes for cardiac injury, negative Myoview stress test. Abnormal EKG with T wave inversions in inferior leads. 2. Malignant hypertension.  3. Diabetes mellitus, insulin dependent. Hemoglobin A1c 10.3, uncontrolled.  4. Obesity.  5. Hyperlipidemia. 6. Coronary artery disease status post five stents placed in the past.  7. Depression. 8. B12 deficiency.  9. Peripheral neuropathy.  10. History of falls and unsteadiness on the feet.  DISCHARGE CONDITION: Stable.   DISCHARGE MEDICATIONS: The patient is to resume his outpatient medications which are:  1. Metformin 1 gram twice daily.  2. Imdur 60 p.o. p.o. at bedtime.  3. Amlodipine 10 mg p.o. daily. 4. Crestor 10 mg p.o. at bedtime.  5. Aspirin 81 mg p.o. daily and two tablets, which would be 162 mg p.o. at bedtime. 6. Carvedilol 25 mg p.o. twice daily. 7. Cymbalta 60 mg p.o. daily. 8. Nitroglycerin 0.4 mg sublingually every five minutes as needed.  9. NovoLog sliding scale three times daily with meals, new dose. 10. Plavix 75 mg p.o. daily, new dose.  11. Pantoprazole 40 mg p.o. daily.  12. Tizanidine 4 mg p.o. at bedtime.  13. Lyrica 75 mg p.o. daily.  14. Lantus insulin 100 units subcutaneously twice daily.  15. Catapres 0.4 mg one tablet every four hours as needed for systolic blood pressure above 878 and diastolic above 676. The patient was advised to watch for orthostatic hypotension and be slow getting up because of concerns of orthostatic hypotension with clonidine (Catapres).  16. Zestril 40 mg p.o. twice daily. This is new dose. The patient was on 20 mg p.o. twice daily dose in the past.  17. Hydrochlorothiazide 50 mg p.o. daily.  NOTE: The patient is not to take Zestoretic 20/25 mg dose  unless recommended by primary care physician.   HOME OXYGEN: None.   DIET: 2 grams salt, low fat, low cholesterol, carbohydrate-controlled diet, regular consistency.   ACTIVITY LIMITATIONS: As tolerated.   DISCHARGE FOLLOWUP: Follow-up with Dr. [name] in two days after discharge.   CONSULTANT: Ida Rogue, MD  RADIOLOGIC STUDIES: Chest portable single view, 10/04/2012, revealed no acute abnormality.  Echocardiogram, 10/06/2012, showed left ventricular systolic function normal. Ejection fraction more than 55%. Transmitral spectral Doppler flow pattern is suggestive of impaired left ventricular relaxation. There is mild concentric left ventricular hypertrophy. Right ventricular systolic function is normal. The left atrium is mildly dilated.   Myoview stress test, 10/06/2012, showed normal study. No evidence of ischemia or infarct. Normal left ventricular systolic function with ejection fraction of 59%.   HISTORY/HOSPITAL COURSE: The patient is a 69 year old Caucasian male with history of hypertension, diabetes mellitus, and multivessel coronary artery disease who presented to the hospital on 10/04/2012. Please refer to Dr. Elwyn Lade admission on 10/04/2012. On arrival to the emergency room, the patient was noted to have new T wave depression in inferior leads. He was also noted to be severely hypertensive with systolic blood pressure around 190s/90s, otherwise, his temperature was normal at 97.8, pulse was 74, and respiration rate was 22. Physical examination was unremarkable.   The patient's lab data, on arrival to the hospital, 10/04/2012, showed glucose of 409, otherwise BMP was unremarkable. The patient's lipase level was normal at 126. Liver enzymes were unremarkable. The patient's cardiac enzymes as well as subsequent two more  sets were within normal limits. White blood cell count was mildly elevated to 14.4, hemoglobin was 14.6, platelet count 234, and MCV was low at 77.  Coagulation panel showed pro time of 13.9, INR was 1.0, and activated PTT was normal at 28.0. The patient's EKG showed normal sinus rhythm at 89 beats per minute, normal axis, and ST depressions as well as T wave inversions were evident in inferior leads, also nonspecific ST-T wave abnormalities were noted in anterolateral leads.   The patient was admitted to the hospital. He was continued on beta blockers, nitroglycerin, and aspirin, as well as heparin, and consultation with Dr. Rockey Situ was obtained. Dr. Rockey Situ saw the patient in consultation, initially on 10/04/2012, and then followed him along on 10/05/2012. Dr. Rockey Situ felt that the patient would benefit from cardiac stress test. He recommended to continue aspirin as well as Plavix and schedule a stress test. The patient was not able to get treadmill study due to leg pain, however, Lexiscan was ordered and that was negative for inducible ischemia.   For hypertension, the patient was advised to continue outpatient medications which were advanced to current levels. The patient was advised to follow up with his primary physician, Dr. [name], for advancement of his blood pressure medications as needed. Because of history of falls we are very reluctant to start him on clonidine. The patient however was advised to take clonidine with significant precautions. His blood pressure is much better controlled than it was on admission, however, it still remains somewhat high at around 846K systolic. On the day of discharge, 10/06/2012, the patient's vital signs showed a temperature of 97.4, pulse 71, respiration rate 20, blood pressure 151/72, and saturation was 95% on room air at rest. It was felt that the patient's chest pain was very likely malignant hypertension related and again the patient was advised to continue to advance medications and follow up with his primary care physician for recommendations.   In regards to diabetes mellitus, the patient's  Hemoglobin A1c was found to be mildly elevated at 10.3. It was felt that the patient's diabetes was very poorly controlled and insulin Lantus was advanced to current levels. It is recommended to follow the patient's Hemoglobin A1c as outpatient and make decisions about medications as needed.   In regards to hyperlipidemia, the patient's lipid panel was also performed. The patient's LDL was found to be 5, total cholesterol was 73, triglycerides were high at 225, and HDL was low at 23. It was felt that the patient's hypertriglyceridemia as well as low HDL level was very likely due to poor diabetes control. The patient's diabetic medications were advanced and the patient's lipid panel should be rechecked in the next few weeks after discharge. The patient was noted to have low potassium levels in the hospital and a level of 3.2 was reported on 10/05/2012. It was supplemented and his potassium level normalized. It is recommended to follow the patient's potassium levels as outpatient since he is having high doses of ACE inhibitors now. The patient is being discharged in stable condition with the above-mentioned medications and follow-up.   TIME SPENT: 40 minutes. ____________________________ Theodoro Grist, MD rv:[id number] D: 10/06/2012 18:15:27 ET T: 10/07/2012 09:51:36 ET JOB#: [id number]  cc: Theodoro Grist, MD, <Dictator> Eduard Clos. Gilford Rile, MD Theodoro Grist MD ELECTRONICALLY SIGNED 10/17/2012 13:13

## 2015-03-08 NOTE — H&P (Signed)
PATIENT NAME:  Steve Phillips, Steve Phillips MR#:  [id number] DATE OF BIRTH:  May 22, 1946  DATE OF ADMISSION:  10/04/2012  REFERRING PHYSICIAN: Dr. Thomasene Lot  PRIMARY CARE PHYSICIAN:  Dr. [name]  CARDIOLOGIST: Dr. [name]    CHIEF COMPLAINT: Chest pain.   HISTORY OF PRESENT ILLNESS: The patient is a pleasant 69 year old Caucasian male with past medical history of hypertension, diabetes mellitus, multivessel coronary artery disease status post drug-eluting stent placement in the diagonal branch in 02/2012 by Dr. Fletcher [name], diabetic foot ulcers, depression, history of headaches, hyperlipidemia, and peripheral neuropathy who presented to [facility] with complaints of chest pain. The patient reports that the chest pain was on the left side of his chest. At times it did radiate to his left arm, but not into his neck. He had associated nausea and vomiting. Chest pain was rather persistent and was similar to the episode of chest pain that he had in 02/2012. However, he reports that this episode is more severe. He was seen in the Emergency Department by Emergency Room staff. The patient was found to have EKG changes and in particular he had T-wave inversions from lead V3 all the way through V6. He also had T-wave inversions in lead I and aVL. There is T-wave flattening in aVF. This is concerning for inferolateral ischemia. The first troponin was negative. Chest pain began subsiding when I first evaluated the patient; however, he has had persistent chest pain for most of today. The case was discussed with Dr. Rockey Situ and we will be admitting the patient to the Critical Care Unit and start him on a heparin and nitroglycerin drip.   PAST MEDICAL HISTORY:  1. Hypertension.  2. Diabetes mellitus.  3. Coronary artery disease, multivessel in nature. The patient had drug-eluting stent placed in 02/2012 in the diagonal branch. The patient has history of five total stent placements.  4. Diabetic foot  ulcer.  5. Depression.  6. Peripheral neuropathy.  7. Hyperlipidemia.    PAST SURGICAL HISTORY:  1. Cholecystectomy.  2. Appendectomy.  3. Back surgery.  4. Percutaneous Achilles tendon lengthening left lower extremity with fifth metatarsal ulceration debridement by Dr. Vickki Muff.   ALLERGIES: Codeine and Vicodin.   HOME MEDICATIONS:  1. Zestoretic 20/25 mg, 1 tablet p.o. daily.  2. Tizanidine 4 mg p.o. daily.  3. Protonix 40 mg daily.  4. Plavix 75 mg daily.  5. Sliding scale insulin. 6. Nitroglycerin 0.4 mg sublingual every five minutes p.r.n.  7. Metformin 1000 mg p.o. b.i.d.  8. Lantus insulin 80 units b.i.d.  9. Imdur 60 mg p.o. daily.  10. Cymbalta 60 mg p.o. daily.  11. Crestor 10 mg p.o. daily.  12. Coreg 25 mg p.o. b.i.d.  13. Aspirin 81 mg 3 tablets p.o. daily.  14. Amlodipine 10 mg daily.   SOCIAL HISTORY: The patient lives in [place]. He is married. He has three adult children. He quit smoking 30 years ago. No alcohol or illicit drug use.   FAMILY HISTORY: Mother died secondary to pneumonia. Father died secondary to throat cancer.   REVIEW OF SYSTEMS: CONSTITUTIONAL: Denies fevers, chills, or weight loss. EYES: Denies diplopia or blurry vision. HEENT: Denies headaches, hearing loss, or epistaxis. CARDIOVASCULAR: As per the history of present illness, having chest pain that has been rather persistent. RESPIRATORY: Denies cough or shortness of breath. GI: Reports nausea and vomiting. Denies diarrhea. GU: Denies frequency, urgency, or dysuria. MUSCULOSKELETAL: Denies joint pain, swelling, or redness. INTEGUMENT: Denies skin rashes or lesions.  NEUROLOGIC:  Has history of peripheral neuropathy. PSYCHIATRIC: Denies depression or bipolar disorder.  ENDOCRINE: Denies polyuria or polydipsia. Has history of diabetes mellitus. HEMATOLOGIC/LYMPHATIC: Denies easy bruisability, bleeding, or swollen lymph nodes. Has B12 deficiency. ALLERGY/IMMUNOLOGIC: Denies seasonal allergies or history  of immunodeficiency.   PHYSICAL EXAMINATION:  VITAL SIGNS: Temperature 97.8, pulse 74, respirations 22, blood pressure 194/91.   GENERAL: Well developed, well-nourished, obese Caucasian male who appears his stated age, currently in no acute distress.   HEENT: Normocephalic, atraumatic. Extraocular movements are intact. Pupils equal, round, reactive to light. No scleral icterus. Conjunctivae are pink. No epistaxis noted. Gross hearing intact. Oral mucosa moist.   NECK: Supple without JVD or lymphadenopathy.   LUNGS: Clear to auscultation bilaterally with normal respiratory effort.   HEART: S1, S2, regular rate and rhythm. No murmurs, rubs, or gallops appreciated.   ABDOMEN: Obese, soft, nontender, nondistended. Bowel sounds positive. No rebound or guarding. No gross organomegaly appreciated.   EXTREMITIES: No clubbing, cyanosis, or edema.   NEUROLOGIC: The patient is alert and oriented to time, person, and place. Strength is five out of five in both upper and lower extremities.   SKIN: Warm and dry. No rashes noted.   GU: No suprapubic tenderness is noted at this time.   MUSCULOSKELETAL: No joint redness, swelling, or tenderness appreciated.   PSYCHIATRIC: The patient has an appropriate affect and appears to have good insight into his current illness.   LABORATORY DATA: Blood glucose is 387, CBC shows WBC 14.4, hemoglobin 14.6, hematocrit 44, platelets 234. Chest x-ray shows no acute chest abnormality. EKG shows T-wave inversions from leads V3 through V6. There are also T-wave inversions in lead I and aVL. T-wave flattening noted in aVF. These findings are concerning for inferolateral ischemia. These findings are new as compared to the EKG from 02/2012. BMP shows sodium 136, potassium 3.7, chloride 102, CO2 27, BUN 10, creatinine 0.91, glucose 409. Troponin less than 0.02. BNP 49. LFTs are normal. Lipase is 126.   IMPRESSION: This is a 69 year old Caucasian male with past medical  history of hypertension, diabetes mellitus, coronary artery disease with drug-eluting stent placed in the diagonal branch in 02/2012 by Dr. Fletcher [name]. However, the patient has five total stents placed, diabetic foot ulcer, depression, hyperlipidemia, obesity, and peripheral neuropathy, and presented to [facility] with chest pain and found to have unstable angina.  1. Unstable angina: The patient has EKG findings that are concerning for unstable angina. He had T-wave inversions in leads V3 through V6 and also had T-wave inversions in leads I and aVL. There is also T-wave flattening in aVF. These findings are new as compared to EKG from 02/2012. I called Dr. Rockey Situ about these findings. We agreed to admit the patient to the Critical Care Unit. We will start the patient on morphine, oxygen, nitroglycerin drip, and heparin drip. We will continue home doses of aspirin and Plavix. We will continue to cycle cardiac enzymes at this point in time. Dr. Rockey Situ will also be following on the case.  2. Malignant hypertension: Blood pressure noted to be quite elevated. We will start the patient on metoprolol 5 mg IV every six hours with hold parameters. We will also continue the patient on home doses of amlodipine, Coreg, and Zestoretic.  3. Hyperlipidemia: We will check fasting lipid profile in the morning and continue the patient on Crestor 10 mg p.o. at bedtime.  4. Diabetes mellitus: We will continue the patient on home dose of Lantus. We will also provide him  sliding scale insulin. Blood sugar is a bit elevated and maybe secondary to the unstable angina. We will continue to monitor blood glucose.  5. Deep vein thrombosis prophylaxis: The patient will be on a heparin drip.  6. CODE STATUS: FULL CODE.  7. Patient identifies his wife as his healthcare power of attorney.   TIME SPENT:  One hour.  ____________________________ Tama High, MD mnl:[id number] D:  10/04/2012 21:00:01 ET           T: 10/05/2012 07:52:19 ET        JOB#: [id number]  cc: Tama High, MD, <Dictator> Eduard Clos. Gilford Rile, MD Tama High MD ELECTRONICALLY SIGNED 11/01/2012 20:35

## 2015-03-09 DIAGNOSIS — M5136 Other intervertebral disc degeneration, lumbar region: Secondary | ICD-10-CM | POA: Diagnosis not present

## 2015-03-09 DIAGNOSIS — M5416 Radiculopathy, lumbar region: Secondary | ICD-10-CM | POA: Diagnosis not present

## 2015-03-09 NOTE — Telephone Encounter (Signed)
Attempted to contact pt. No answer, no voicemail.

## 2015-03-09 NOTE — Telephone Encounter (Signed)
Unfortunately, it is not safe to stop Plavix now due to recent stent. He is due for a follow up with me.

## 2015-03-10 DIAGNOSIS — I10 Essential (primary) hypertension: Secondary | ICD-10-CM | POA: Diagnosis not present

## 2015-03-10 DIAGNOSIS — I2510 Atherosclerotic heart disease of native coronary artery without angina pectoris: Secondary | ICD-10-CM | POA: Diagnosis not present

## 2015-03-10 DIAGNOSIS — E119 Type 2 diabetes mellitus without complications: Secondary | ICD-10-CM | POA: Diagnosis not present

## 2015-03-10 DIAGNOSIS — M79605 Pain in left leg: Secondary | ICD-10-CM | POA: Diagnosis not present

## 2015-03-10 DIAGNOSIS — G629 Polyneuropathy, unspecified: Secondary | ICD-10-CM | POA: Diagnosis not present

## 2015-03-10 DIAGNOSIS — R296 Repeated falls: Secondary | ICD-10-CM | POA: Diagnosis not present

## 2015-03-11 ENCOUNTER — Ambulatory Visit (INDEPENDENT_AMBULATORY_CARE_PROVIDER_SITE_OTHER): Payer: Medicare Other | Admitting: Nurse Practitioner

## 2015-03-11 ENCOUNTER — Encounter: Payer: Self-pay | Admitting: Nurse Practitioner

## 2015-03-11 VITALS — BP 158/70 | HR 60 | Temp 98.4°F | Resp 16 | Ht 73.0 in | Wt 258.4 lb

## 2015-03-11 DIAGNOSIS — M5416 Radiculopathy, lumbar region: Secondary | ICD-10-CM | POA: Diagnosis not present

## 2015-03-11 DIAGNOSIS — I25110 Atherosclerotic heart disease of native coronary artery with unstable angina pectoris: Secondary | ICD-10-CM

## 2015-03-11 DIAGNOSIS — Z09 Encounter for follow-up examination after completed treatment for conditions other than malignant neoplasm: Secondary | ICD-10-CM | POA: Diagnosis not present

## 2015-03-11 MED ORDER — TIZANIDINE HCL 4 MG PO TABS: 4.0000 mg | ORAL_TABLET | Freq: Three times a day (TID) | ORAL | Status: DC | PRN

## 2015-03-11 NOTE — H&P (Signed)
PATIENT NAME:  Steve Phillips, Steve Phillips MR#:  [id number] DATE OF BIRTH:  09/10/1946  DATE OF ADMISSION:  02/25/2013  PRIMARY CARE PHYSICIAN:  Dr. [name].   PRIMARY CARDIOLOGIST:  Dr. Fletcher [name].   CHIEF COMPLAINT:  Hypotension.   HISTORY OF PRESENT ILLNESS:  This is a 69 year old male with a history of coronary artery disease, diabetes, insulin-dependent and hypertension, who presents with the above complaint. Since yesterday, the patient has had nausea, vomiting and diarrhea. He has had several bowel movements since yesterday, although today it has subsided, also the vomiting has subsided. However, this morning, he woke up and he just was not feeling well. He thought his blood sugars were low. He asked to check his blood sugar; it was 263, so the wife decided to check his blood pressure. She did a manual blood pressure; it was 68/60. EMS was then called for further evaluation. Upon arrival by EMS, his blood pressure systolically was also in the 60s. At that time, he was feeling dizzy and lightheaded. He was brought to the ER where he has received normal saline, and his blood pressure systolically has risen to 106. He has no complaints at this time. No chest pain, shortness of breath or dizziness.    The patient has been seeing Dr. Fletcher [name] due to his intentional weight loss and exercise program. He has actually been taken off of some his blood pressure medications.    REVIEW OF SYSTEMS:   CONSTITUTIONAL:  No fever, fatigue or weakness.  EYES:  No blurred or double vision, inflammation.  EAR, NOSE, THROAT:  No ear pain, hearing loss, seasonal allergies, postnasal drip. Positive snoring.  RESPIRATORY:  No cough, hemoptysis, COPD or dyspnea.  CARDIOVASCULAR:  Denies any chest pain, orthopnea, edema, arrhythmia, dyspnea on exertion, palpitations or syncope.  GASTROINTESTINAL:  He had some nausea, vomiting and diarrhea yesterday, which have subsided, as well as some abdominal crampy pain. No hematemesis, melena  or ulcers. Positive GERD. No rectal bleeding.  GENITOURINARY:  No dysuria or hematuria.  ENDOCRINE:  No polyuria or polydipsia.  HEMATOLOGIC AND LYMPHATICS:  No anemia or easy bruising.   SKIN:  No rash or lesions.   MUSCULOSKELETAL:  He has limited activity just basically due to some body habitus, but he is now trying to work on his weight loss.  NEUROLOGIC:  No history of CVA, TIAs. He has bilateral paresthesias of his lower extremities and decreased sensation of his lower extremities from his diabetes.  PSYCHIATRIC:  Positive anxiety and depression.   PAST MEDICAL HISTORY:   1,  Coronary artery disease, status post 5 stents.  2.  Hypertension.  3.  Insulin-dependent diabetes.  4.  Depression and anxiety.   MEDICATIONS:  1.  Vitamin B12 one dose monthly.  2.  Victoza 1.8 mL subcutaneous daily. This was causing him some nausea in the past.  3.  Tizanidine 4 mg t.i.d. p.r.n.  4.  Spironolactone 25 mg daily.  5.  Plavix 75 mg daily.  6.  Pantoprazole 40 mg daily.  7.  NovoLog 15 to 35 units t.i.d.  8.  Nitroglycerin sublingual p.r.n.  9.  Metformin 1000 mg b.i.d.  10.  Lyrica 75 mg b.i.d.  11.  Lantus 80 units b.i.d.  12.  Imdur 60 mg daily.  13.  HCTZ/lisinopril 1 tablet b.i.d. 12.5/20.  14.  Cymbalta 60 mg daily.  15.  Crestor 10 mg daily.  16.  Coreg 25 b.i.d.  17.  Aspirin 81 mg daily.   ALLERGIES: CODEINE,  VICODIN AND TRAZODONE.   PAST SURGICAL HISTORY: 1.  Appendectomy.  2.  Left foot reconstruction.  3.  Cholecystectomy.  4.  Cardiac stents.  5.  Shoulder surgery.   SOCIAL HISTORY: No tobacco, alcohol or drug use. He lives with his wife.   FAMILY HISTORY:  Positive for breast cancer in his mother and his sister, and his father died of throat cancer.   PHYSICAL EXAMINATION: VITAL SIGNS:  Temperature 97.6, pulse of 77, respirations 18, blood pressure 92/64, currently 1106/64. Pulse ox 97% on room air.  GENERAL:  The patient is alert and oriented, not in acute  distress.  HEENT:  Head is atraumatic. Pupils are round and reactive. Sclerae anicteric. Mucous membranes are moist.  Oropharynx is clear.   NECK:  Supple without JVD, carotid bruit or enlarged thyroid.  CARDIOVASCULAR:  Regular rate and rhythm. No murmurs, gallops or rubs. PMI is laterally displaced.   LUNGS:  Clear to auscultation without crackles, rales, rhonchi or wheezing. Normal to percussion.  BACK:  No CVA or vertebral tenderness.  ABDOMEN:  Obese. Bowel sounds are positive. Nontender. Hard to appreciate organomegaly due to body habitus.  EXTREMITIES:  No clubbing, cyanosis or edema.  NEUROLOGIC:  Cranial nerves II through XII are intact. No focal deficits Babinski sign is downgoing. He does have decreased sensation in his lower extremities, actually he cannot even  feel on the left lower extremity.   LABORATORY, RADIOLOGIC AND DIAGNOSTIC DATA: Sodium 136, potassium 3.9, chloride 104, bicarb 23, BUN 22, creatinine 1.38, glucose 287, calcium 7.7, bilirubin 0.5, alk phos 56, ALT 24, AST 16, total protein 5.7, albumin is 3.1. Hemoglobin 14.6, hematocrit 42.7, platelets 202. Troponin less than 0.02. CK 43, CK-MB 1.2. Magnesium 1.0. INR is 1.1. Amylase 22, lipase is 116. Chest x-ray shows no acute cardiopulmonary disease. Lactic acid is 2.4. CT of the abdomen and pelvis without contrast shows no acute abnormality. EKG shows some T wave abnormalities in the lateral leads.   ASSESSMENT AND PLAN:  This is a 69 year old male who presented with hypotension. For further details, please refer to the history and physical.  1.  Systemic inflammatory response syndrome with hypotension, leukocytosis and elevated lactic acid. The patient will be admitted to telemetry. We will continue the IV fluids for his SIRS. It seems to have responded. I suspect that this is related to his nausea, vomiting and diarrhea with a component of dehydration. He did not take his blood pressure medications actually yesterday because  he forgot. If he would have taken them, his blood pressure may have even been lower. We do see some EKG changes with some T wave abnormalities in the lateral leads, which could be from his hypotension, which we will monitor carefully. If blood cultures have not been ordered, I will order them, and UA and urine cultures have been also ordered.  2.  Diarrhea, which has resolved. We will check a stool culture for now.  3.  History of hypertension, now with hypotension. We will hold all blood pressure medications. Due to his exercise program and weight loss, he probably does not need to be discharged on all these medications. If his blood pressure tolerates, I would recommend at least a beta blocker and low-dose ACE, given his comorbidities.  4.  Abnormal EKG with some mild lateral ischemia. We will cycle troponins, place the patient on telemetry, and monitor closely. If needed, we can consult Dr. Fletcher [name]. I suspect that some of this is related to his hypotension. I will  also go ahead and order an EKG for the a.m. to compare.  5.  Hypomagnesemia. We will replete and recheck in the a.m.  6.  Renal insufficiency, likely secondary to his vomiting, diarrhea and dehydration. Hold any nephrotoxic agents, replete with IV fluids and recheck in the a.m.  7.  Diabetes. Decrease the insulin dose. Place him on sliding scale insulin. He is currently on clear liquid diet. Can be on an ADA diet when he tolerates, and we will monitor his blood sugars carefully.  I have held his victoza  as he said this has caused nausea in the past.  I do not think it is related to his acute episode, but we will hold it for now.  8.  Gastroenteritis as the etiology of his nausea, vomiting and diarrhea, which seems to have improved.    TIME SPENT: Approximately 55 minutes.    ____________________________ Donell Beers. Benjie Karvonen, MD spm:dmm D: 02/25/2013 10:20:19 ET T: 02/25/2013 10:41:07 ET JOB#: [id number]  cc: Deverick Pruss P. Benjie Karvonen, MD,  <Dictator> Eduard Clos. Gilford Rile, MD Donell Beers Phylicia Mcgaugh MD ELECTRONICALLY SIGNED 02/25/2013 16:11

## 2015-03-11 NOTE — Discharge Summary (Signed)
PATIENT NAME:  Steve Phillips, Steve Phillips MR#:  [id number] DATE OF BIRTH:  Jun 26, 1946  DATE OF ADMISSION:  02/25/2013 DATE OF DISCHARGE:  02/26/2013  PRESENTING COMPLAINT: Low blood pressure and not feeling well.   DISCHARGE DIAGNOSES: 1.  Systemic antiinflammatory response syndrome secondary to dehydration from acute gastroenteritis.  2.  Hypotension due to dehydration, resolved.  3.  Coronary artery disease status post stent.  4.  Type 2 diabetes.   CODE STATUS: FULL CODE.   DISCHARGE MEDICATIONS: 1.  Metformin 1000 mg b.i.d.  2.  Crestor 10 mg daily at bedtime.  3.  Coreg 25 mg b.i.d.  4.  Cymbalta delayed-release p.o. daily.  5.  Nitroglycerin sublingual as needed.  6.  Plavix 75 mg daily.  7.  Aspirin 81 mg 3 times a day.  8.  Lantus 80 units sub-Q b.i.d.  9.  Lyrica 75 mg b.i.d.  10.  Tizanidine 4 mg 1 tablet 3 times a day as needed.  11.  NovoLog Regular 15 to 35 units sub-Q 3 times a day according to sliding scale. 12.  Spironolactone 25 mg daily at bedtime.  13.  Vitamin B12 intramuscular once a month.  14.  Victoza 18 mg sub-Q 1.8 mL daily.  15.  Pantoprazole 40 mg delayed release b.i.d.  16.  Vicodin 5 mg every 8 hours as needed.  17.  Imdur 30 mg extended release daily at bedtime.  18.  Hydrochlorothiazide/lisinopril 12.5/20 mg 1 tablet at bedtime.   DISCHARGE FOLLOWUP:  With Dr. [name] in 1 to 2 weeks.   LABORATORY AND DIAGNOSTICS:  EKG showed sinus rhythm with first degree AV block.   CBC within normal limits. Basic metabolic panel within normal limits, except glucose of 176. Magnesium was 1.0 on admission; at discharge was 1.7.   Stool culture negative for any pathogenic E. coli or Campylobacter.   Blood culture: No growth in 48 hours.   CT of the abdomen:  Essentially was within normal limits.   BRIEF SUMMARY OF HOSPITAL COURSE:  [name] is a 69 year old Caucasian gentleman with multiple medical problems who comes into the Emergency Room with:  1.  SIRS  with hypotension, leukocytosis, elevated lactic acid,  nausea, vomiting and diarrhea which was suspected from acute gastroenteritis, likely from food poisoning, ate some old lettuce versus viral.  The patient was admitted on the medical floor. His blood pressure medications were held.  Given IV fluids. Blood pressure remained stable thereafter and BP meds were resumed. His was tolerating a regular diet prior to discharge. He did not have anymore diarrhea.  2.  Diarrhea, resolved.  Likely due to acute gastroenteritis.  3.  History of hypertension. Resumed BP meds.  4.  Hypomagnesemia. Received IV vanc and remained stable.  5.  Renal insufficiency/acute renal failure in the setting of dehydration from vomiting and diarrhea, resolved.  6.  Type 2 diabetes with neuropathy. Resumed insulin and rest of home meds.   Hospital stay otherwise remained stable. The patient remained a FULL CODE.  TIME SPENT: 40 minutes.  ____________________________ Hart Rochester Posey Pronto, MD sap:sb D: 02/27/2013 07:53:31 ET T: 02/27/2013 08:46:22 ET JOB#: [id number]  cc: Cane Dubray A. Posey Pronto, MD, <Dictator> Eduard Clos. Gilford Rile, MD Ilda Basset MD ELECTRONICALLY SIGNED 03/09/2013 7:01

## 2015-03-11 NOTE — Progress Notes (Signed)
Pre visit review using our clinic review tool, if applicable. No additional management support is needed unless otherwise documented below in the visit note. 

## 2015-03-11 NOTE — Telephone Encounter (Signed)
Attempted to contact pt. No answer, no voicemail.

## 2015-03-11 NOTE — Progress Notes (Signed)
Subjective:    Patient ID: Steve Phillips, male    DOB: 1946/05/12, 69 y.o.   MRN: [id number]  HPI  Steve Phillips is here for a follow up from d/c at [facility] on 03/07/15.   1)Stent on 4/6, happened on same day as ER visit  No back pain before the procedure  Dr. Sharlet Salina to do the nerve block- Wednesday morning, helped some   May 11th next one  BS- 162-300  Dr. Fletcher [name] on Monday  Home health nurse yesterday- concerned about BP higher yesterday   Pt has Walker rolling   PT at home next week  Neck surgery last year with Dr. Hal Neer. Pt does not want back surgery because he is afraid of the recovery being as bad as with his neck.   Review of Systems  Constitutional: Negative for fever, chills, diaphoresis, activity change and fatigue.  Eyes: Negative for visual disturbance.  Gastrointestinal: Negative for nausea, vomiting and diarrhea.  Genitourinary: Negative for dysuria and difficulty urinating.  Musculoskeletal: Positive for back pain and arthralgias. Negative for myalgias, joint swelling, gait problem, neck pain and neck stiffness.  Skin: Negative for rash.  Neurological: Negative for dizziness, weakness and numbness.       Denies losing bowel/bladder function, saddle anesthesia, or weakness of extremities.   Hematological: Does not bruise/bleed easily.   Past Medical History  Diagnosis Date  . Shingles   . Diabetes mellitus   . Hyperlipidemia   . Broken leg     left...s/p pins  . Lower leg pain     chronic ,left leg  . Acute angina   . Depression   . CAD (coronary artery disease)     s/p multiple PCI with stent RCA,LAD and obtuse marginal,followed @ [facility] on the accord study.., cath 02/2012 90% D1, s/p drug-eluting stent Dr. Fletcher [name]  . Falls 09/2011  . Dizziness   . Shortness of breath   . Headache(784.0)   . Hypertension     dr Jerilynn Mages Audelia Acton     [facility] in [place]  . Stroke   . Bell's palsy   . Parkinson disease   . Neuropathy     History   Social History  . Marital  Status: Married    Spouse Name: N/A  . Number of Children: N/A  . Years of Education: N/A   Occupational History  . Not on file.   Social History Main Topics  . Smoking status: Former Smoker    Quit date: 11/08/1971  . Smokeless tobacco: Never Used  . Alcohol Use: No  . Drug Use: No  . Sexual Activity: Not on file   Other Topics Concern  . Not on file   Social History Narrative    Past Surgical History  Procedure Laterality Date  . Appendectomy    . Cholecystectomy    . Left leg surgery    . Left foot surgery    . Drainage port in left testicle    . Cardiac catheterization  02/2012  . Back surgery    . Anterior cervical decomp/discectomy fusion N/A 04/30/2013    Procedure: ANTERIOR CERVICAL DECOMPRESSION/DISCECTOMY FUSION 2 LEVELS;  Surgeon: Faythe Ghee, MD;  Location: [facility] NEURO ORS;  Service: Neurosurgery;  Laterality: N/A;  Cervical four-five, Cervical six-seven anterior cervical decompression fusion with trabecular metal cage and plate  . Left heart catheterization with coronary angiogram N/A 02/23/2015    Procedure: LEFT HEART CATHETERIZATION WITH CORONARY ANGIOGRAM;  Surgeon: [name], MD;  Location: [facility] CATH  LAB;  Service: Cardiovascular;  Laterality: N/A;    Family History  Problem Relation Age of Onset  . Cancer Mother     BREAST  . Heart disease Mother     STENT  . Cancer Father     THROAT  . Heart disease Sister     STENT; HTN  . Heart attack Brother   . Hypertension Brother   . Hypertension Sister     Allergies  Allergen Reactions  . Codeine Nausea Only    Derivatives Nausea.  . Tramadol     unknown  . Trazodone And Nefazodone Nausea Only    Current Outpatient Prescriptions on File Prior to Visit  Medication Sig Dispense Refill  . acetaminophen (TYLENOL) 325 MG tablet Take 2 tablets (650 mg total) by mouth every 4 (four) hours as needed for headache or mild pain.    Marland Kitchen amLODipine (NORVASC) 5 MG tablet Take 1 tablet (5 mg total) by mouth  daily. 90 tablet 3  . aspirin 81 MG tablet Take 81 mg by mouth 3 (three) times daily.    . carbidopa-levodopa (SINEMET IR) 25-100 MG per tablet Take 2 tablets by mouth 3 (three) times daily. 270 tablet 2  . carvedilol (COREG) 25 MG tablet TAKE ONE TABLET BY MOUTH TWICE DAILY 180 tablet 1  . clopidogrel (PLAVIX) 75 MG tablet Take 1 tablet (75 mg total) by mouth daily with breakfast. 90 tablet 1  . cyanocobalamin (,VITAMIN B-12,) 1000 MCG/ML injection Inject 1,000 mcg into the muscle once.    . DULoxetine (CYMBALTA) 60 MG capsule TAKE 1 CAPSULE DAILY 90 capsule 3  . glucose blood (FREESTYLE TEST STRIPS) test strip Use to check blood sugar up to three times daily 300 each 1  . HYDROcodone-acetaminophen (NORCO/VICODIN) 5-325 MG per tablet Take 1-2 tablets by mouth every 8 (eight) hours as needed. 90 tablet 0  . insulin aspart (NOVOLOG) 100 UNIT/ML injection Inject 48-50 units 3 times a day with meals as instructed. (Patient taking differently: Inject 46-54 units 3 times a day with meals as instructed.) 140 mL 1  . Insulin Glargine (TOUJEO SOLOSTAR) 300 UNIT/ML SOPN Inject 55 Units into the skin 2 (two) times daily at 8 am and 10 pm. (Patient taking differently: Inject 70 Units into the skin 2 (two) times daily at 8 am and 10 pm. ) 27 pen 1  . lisinopril-hydrochlorothiazide (PRINZIDE,ZESTORETIC) 20-12.5 MG per tablet Take 1 tablet by mouth 2 (two) times daily. 180 tablet 1  . metFORMIN (GLUCOPHAGE) 1000 MG tablet TAKE ONE TABLET BY MOUTH TWICE DAILY 180 tablet 1  . nitroGLYCERIN (NITROSTAT) 0.4 MG SL tablet Place 1 tablet (0.4 mg total) under the tongue every 5 (five) minutes as needed for chest pain. 30 tablet 6  . pantoprazole (PROTONIX) 40 MG tablet Take 1 tablet (40 mg total) by mouth 2 (two) times daily. 180 tablet 3  . pregabalin (LYRICA) 150 MG capsule Take 1 capsule (150 mg total) by mouth 2 (two) times daily. 180 capsule 2  . simvastatin (ZOCOR) 20 MG tablet Take 1 tablet (20 mg total) by mouth  at bedtime. 90 tablet 3   No current facility-administered medications on file prior to visit.       Objective:   Physical Exam  Constitutional: He is oriented to person, place, and time. He appears well-developed and well-nourished. No distress.  BP 158/70 mmHg  Pulse 60  Temp(Src) 98.4 F (36.9 C) (Oral)  Resp 16  Ht 6\' 1"  (1.854 m)  Wt 258  lb 6.4 oz (117.209 kg)  BMI 34.10 kg/m2  SpO2 98%   HENT:  Head: Normocephalic and atraumatic.  Right Ear: External ear normal.  Left Ear: External ear normal.  Cardiovascular: Normal rate, regular rhythm and normal heart sounds.  Exam reveals no gallop and no friction rub.   No murmur heard. Pulmonary/Chest: Effort normal and breath sounds normal. No respiratory distress. He has no wheezes. He has no rales. He exhibits no tenderness.  Musculoskeletal: He exhibits tenderness. He exhibits no edema.  Tender lumbar paraspinal muscles to palpation  Neurological: He is alert and oriented to person, place, and time. He displays normal reflexes. No cranial nerve deficit. He exhibits normal muscle tone. Coordination normal.  Iliopsoas 5/5 Bilateral, Tib anterior 5/5 bilateral, EHL 5/5 bilateral, no ankle clonus, intact heel/toe/sequential walking, sensation intact upper and lower extremities. Straight leg raise negative bilaterally.  Skin: Skin is warm and dry. No rash noted. He is not diaphoretic.  Psychiatric: He has a normal mood and affect. His behavior is normal. Judgment and thought content normal.      Assessment & Plan:

## 2015-03-11 NOTE — Patient Instructions (Signed)
Continue to follow up with Dr. Sharlet Salina, Dr. Fletcher [name], and Dr. Hal Neer.   Call us if you need anything.   Stop the tizanidine if it is making you woozy.

## 2015-03-12 NOTE — Discharge Summary (Signed)
PATIENT NAME:  Steve Phillips, Steve Phillips MR#:  [id number] DATE OF BIRTH:  02-24-1946  DATE OF ADMISSION:  06/18/2014 DATE OF DISCHARGE:  06/19/2014  ADMITTING PHYSICIAN: Dr. Verdell Carmine.  DISCHARGING PHYSICIAN: Dr. Gladstone Lighter.  PRIMARY CARE PHYSICIAN: Dr. [name].  PRIMARY NEUROLOGIST: Dr. Jennings Books.   CONSULTATIONS IN THE HOSPITAL: Neurology consultation with Dr. Valora Corporal.   DISCHARGE DIAGNOSES: 1.  Bell's palsy with left facial droop.  2.  Acute cerebrovascular accident with mild to moderate small right frontal infarct with no residual neurological deficits.  3.  Hypertension.  4.  Cervical radiculopathy with weakness in the right arm which is chronic.  5.  Insulin-dependent diabetes mellitus.  6.  Peripheral neuropathy.  7.  Hyperkalemia.  8.  Parkinson disease. 9.  Gastroesophageal reflux disease.  10.  Hyperlipidemia.   DISCHARGE HOME MEDICATIONS:  1.  Metformin 1000 mg p.o. b.i.d.  2.  Crestor 10 mg p.o. at bedtime.  3.  Coreg 25 mg p.o. b.i.d. 4.  Cymbalta 60 mg p.o. daily.  5.  Sublingual nitroglycerin 0.4 mg every 5 minutes as needed for chest pain.  6.  Aspirin 81 mg p.o. 3 times a day.  7.  Tizanidine 4 mg p.o. 3 times a day as needed.  8.  Protonix 40 mg p.o. b.i.d.  9.  Hydrochlorothiazide/lisinopril 12.5/20 mg p.o. b.i.d.  10.  Cyanocobalamin 1000 mcg 1 mL injectable once a month.  11.  Lyrica 75 mg 2 capsules twice a day. 12.  Plavix 75 mg p.o. q. daily. 13.  NovoLog subcutaneous solution 46 units 3 times a day with meals.  14.  Lantus 70 units subcutaneous at bedtime.  15.  Carbidopa/levodopa 25/200 mg 1 p.o. 3 times a day.  16.  Norco 5/325 mg q. 8 hours p.r.n. for pain.  17.  Prednisone 50 mg for 4 more days and then taper 10 mg off every day until tapered off.  18.  Valacyclovir 1 gram b.i.d. for 9 days.  19.  Ocular lubricant ophthalmic ointment 1 application to the left eye 6 times a day for 10 days.  20.  Norvasc 5 mg a daily.   DISCHARGE  DIET: Low-sodium, carbohydrate-controlled diet.   DISCHARGE ACTIVITY: As tolerated.    FOLLOWUP INSTRUCTIONS:  1.  Follow up with neurologist, Dr. Jennings Books in 1-2 weeks. Check with him about a 60-day cardiac event monitor. 2.  Outpatient physical therapy services.  3.  PCP follow-up in 2 weeks.  4.  Check low carb, low salt diet. 5.  Endocrinology follow-up as scheduled.   LABORATORIES AND IMAGING STUDIES PRIOR TO DISCHARGE: Sodium 139, potassium 3.9, chloride 107, bicarbonate 23, BUN 14, creatinine 1.1, glucose 320, calcium of 8.4.   LDL cholesterol 49, HDL 29, total cholesterol 106, triglycerides of 142.   MRA of the brain showing acute non-hemorrhagic 7 mm infarct involving right frontal lobe in the pre-matter cortex; otherwise, stable atrophy and diffuse white matter disease, and remote lacunar infarcts of the basal ganglia. Ultrasound of the carotids bilaterally showing minimal to mild plaque formation noted in both proximal internal carotid arteries consistent with less than 50% stenosis. Echo Doppler showing normal LV ejection fraction, EF of 55%-60%, moderate concentric left ventricular hypertrophy noted. No cardiac source of embolism noted.   WBC 11.9, hemoglobin 13.3, hematocrit 41.0, platelet count 211,000. Troponins remain negative.   CT of the head showing no acute intracranial abnormality, chronic small vessel ischemic changes.   BRIEF HOSPITAL COURSE: Steve Phillips is a 69 year old elderly male  with a past medical history significant for hypertension, diabetes, Parkinson's disease, and prior remote cerebrovascular accidents who presents to actually urgent care with left facial droop and also left arm weakness and numbness, and also had slurred speech. He was sent over to the ER.   1.  Acute cerebrovascular accident. The patient did have Bell's palsy and acute CVA.  His facial droop and cranial nerve VII palsy symptoms are from Bell's palsy.  He was seen by neurologist and Dr.  Valora Corporal started him on a prednisone taper and also Valtrex with improvement in his symptoms today. His MRI did show small acute right frontal cortex infarct. He did have a transient left arm weakness which has resolved, and currently has no new neurological symptoms. He has prior strokes, prior weakness of the right arm from C-spine surgery, but he is more stable at this time.  He is already on aspirin, Plavix, and statin, which were continued.  Carotid Dopplers did not show any significant stenosis.  Echo did not show any source of emboli from the heart, but there is still a concern about embolic stroke. The patient does not have any history of atrial fibrillation.  He might benefit from a 60-day cardiac event monitor, which he will follow up with his neurologist and PCP in the office. He worked with physical therapy, and worked pretty good.  They recommended either home health or outpatient services, and patient wanted to get outpatient physical therapy and wife said they will drive him to outpatient physical therapy services, which he had in the past too.   2.  Diabetes mellitus, uncontrolled, working with the endocrinologist in [place], Dr. Cruzita Lederer.  Currently on Lantus and NovoLog with meals along with metformin which will be continued at the time of discharge, especially since he will be discharged on prednisone. Instructions given about the lower carb diet.   3.  Hypertension. Blood pressure was elevated in the hospital. He is already on Coreg and lisinopril/hydrochlorothiazide.  Norvasc has been added to his blood pressure medications.   4.  His course has been, otherwise, uneventful in the hospital.   DISCHARGE CONDITION: Stable.   DISCHARGE DISPOSITION: Home with outpatient physical therapy services.   TIME SPENT ON DISCHARGE: 40 minutes.    ____________________________ Gladstone Lighter, MD rk:ts D: 06/19/2014 10:35:44 ET T: 06/19/2014 14:17:46 ET JOB#: [id number]  cc: Gladstone Lighter, MD, <Dictator> Gladstone Lighter MD ELECTRONICALLY SIGNED 07/06/2014 13:52

## 2015-03-12 NOTE — Consult Note (Signed)
Referring Physician:  Henreitta Leber   Primary Care Physician:  [facility], [address], [place], [place] [address], [place] [phone]  Reason for Consult: Admit Date: 17-Jun-2014  Chief Complaint: L facial droop  Reason for Consult: CVA   History of Present Illness: History of Present Illness:   69 yo RHD M presents to [facility] secondary to new onset L facial droop when he woke up.  Pt was not a tPA candidate due to unknown time.  Pt denies any new weakness or numbness.  Pt states that he has been having blurred vision and has a mild headache.  No hearing changes or taste changes.  Pt does not feel like he is getting around as good as he previously did with his Parkinsons.  ROS:  General denies complaints   HEENT no complaints   Lungs no complaints   Cardiac no complaints   GI no complaints   GU no complaints   Musculoskeletal no complaints   Extremities no complaints   Skin no complaints   Neuro headache  numbness/tingling   Endocrine no complaints   Psych no complaints   Past Medical/Surgical Hx:  Parkinson's Disease: Vascular  Stent, Cardiac: X 5  CAD:   GERD - Esophageal Reflux:   Diabetes:   Hypertension:   neck surgery: 05/2013  Cardiac stents x 4:   Appendectomy:   left foot reconstruction from break:   Cholecystectomy:   Past Medical/ Surgical Hx:  Past Medical History reviewed by me as above   Past Surgical History reviewed by me as above   Home Medications: Medication Instructions Last Modified Date/Time  metformin 1000 mg oral tablet 1 tab(s) orally 2 times a day  30-Jul-15 19:15  Crestor 10 mg oral tablet 1 tab(s) orally once a day (at bedtime) 30-Jul-15 19:15  carvedilol 25 mg oral tablet 1 tab(s) orally 2 times a day 30-Jul-15 19:15  Cymbalta 60 mg oral delayed release capsule 1 cap(s) orally once a day (in the morning) 30-Jul-15 19:15  nitroglycerin 0.4 mg sublingual tablet 1 tab(s) sublingual every 5  minutes up to 3 doses as needed for chest pain. *if no relief call md or go to emergency room* 30-Jul-15 19:15  aspirin 81 mg oral tablet 1 tab(s) orally 3 times a day 30-Jul-15 19:15  tizanidine 4 mg oral tablet 1 tab(s) orally 3 times a day, As Needed 30-Jul-15 19:15  pantoprazole 40 mg oral delayed release tablet 1 tab(s) orally 2 times a day 30-Jul-15 19:15  hydrochlorothiazide-lisinopril 12.5 mg-20 mg oral tablet 1 tab(s) orally 2 times a day 30-Jul-15 19:15  cyanocobalamin 1000 mcg/mL injectable solution 1 milliliter(s) injectable once a month 30-Jul-15 19:15  Lyrica 75 mg oral capsule 2 cap(s) orally 2 times a day 30-Jul-15 19:15  Plavix 75 mg oral tablet 1 tab(s) orally once a day 30-Jul-15 19:15  NovoLog 100 units/mL subcutaneous solution 46 unit(s) subcutaneous 3 times a day (with meals) 30-Jul-15 19:15  Lantus 100 units/mL subcutaneous solution 70 unit(s) subcutaneous once a day (at bedtime) 30-Jul-15 19:15  carbidopa-levodopa 25 mg-100 mg oral tablet 1 tab(s) orally 3 times a day 30-Jul-15 19:15  Norco 325 mg-5 mg oral tablet 1 tab(s) orally every 8 hours 30-Jul-15 19:15   Allergies:  Codeine: N/V  Vicodin: Headaches  Trazodone: Unknown  Allergies:  Allergies as above   Social/Family History: Employment Status: disabled  Lives With: spouse  Living Arrangements: house  Social History: no tob, no EtOh, no illicits  Family  History: no CAD, no strokes   Vital Signs: **Vital Signs.:   31-Jul-15 05:35  Vital Signs Type Routine  Temperature Temperature (F) 97.8  Celsius 36.5  Temperature Source oral  Pulse Pulse 67  Respirations Respirations 19  Systolic BP Systolic BP 630  Diastolic BP (mmHg) Diastolic BP (mmHg) 76  Mean BP 94  Pulse Ox % Pulse Ox % 94  Pulse Ox Activity Level  At rest  Oxygen Delivery Room Air/ 21 %    11:27  Vital Signs Type OFF UNIT   Physical Exam: General: NAD, obese  HEENT: normocephalic, sclera nonicteric, oropharynx clear  Neck: supple,  no JVD, no bruits  Chest: CTA B, no wheezing, good movement  Cardiac: RRR, no murmurs, no edema, 2+ pulses  Extremities: no C/C/E, FROM   Neurologic Exam: Mental Status: alert and oriented x 3, normal language, follows complex commands, mild dysarthria  Cranial Nerves: PERRLA, EOMI, nl VF, L peripheral CN VII palsy, tongue midline, shoulder shrug equal  Motor Exam: 5/5 B, increase tone L >R, no tremor, mild bradykinesia  Deep Tendon Reflexes: 1+/4 B, downgoing plantars  Sensory Exam: decreased pin and temp in stocking pattern, no neglect  Coordination: FTN and HTS WNL, moderate retropulsion noted   Lab Results: LabObservation:  31-Jul-15 12:12   OBSERVATION Reason for Test  Hepatic:  30-Jul-15 18:04   Bilirubin, Total 0.5  Alkaline Phosphatase 65 (46-116 NOTE: New Reference Range 06/08/14)  SGPT (ALT) 21 (14-63 NOTE: New Reference Range 06/08/14)  SGOT (AST) 22  Total Protein, Serum 6.4  Albumin, Serum  2.9  Routine Chem:  31-Jul-15 04:43   Glucose, Serum 80  BUN 11  Creatinine (comp) 1.19  Sodium, Serum 144  Potassium, Serum  2.8  Chloride, Serum 106  CO2, Serum 29  Calcium (Total), Serum 8.5  Anion Gap 9  Osmolality (calc) 285  eGFR (African American) >60  eGFR (Non-African American) >60 (eGFR values <49m/min/1.73 m2 may be an indication of chronic kidney disease (CKD). Calculated eGFR is useful in patients with stable renal function. The eGFR calculation will not be reliable in acutely ill patients when serum creatinine is changing rapidly. It is not useful in  patients on dialysis. The eGFR calculation may not be applicable to patients at the low and high extremes of body sizes, pregnant women, and vegetarians.)  Cardiac:  30-Jul-15 18:04   Troponin I < 0.02 (0.00-0.05 0.05 ng/mL or less: NEGATIVE  Repeat testing in 3-6 hrs  if clinically indicated. >0.05 ng/mL: POTENTIAL  MYOCARDIAL INJURY. Repeat  testing in 3-6 hrs if  clinically indicated. NOTE: An  increase or decrease  of 30% or more on serial  testing suggests a  clinically important change)  Routine Hem:  31-Jul-15 04:43   WBC (CBC)  11.9  RBC (CBC) 5.66  Hemoglobin (CBC) 13.4  Hematocrit (CBC) 41.0  Platelet Count (CBC) 211  MCV  72  MCH  23.7  MCHC 32.7  RDW  17.4  Neutrophil % 66.8  Lymphocyte % 19.6  Monocyte % 8.7  Eosinophil % 4.1  Basophil % 0.8  Neutrophil #  8.0  Lymphocyte # 2.3  Monocyte # 1.0  Eosinophil # 0.5  Basophil # 0.1 (Result(s) reported on 18 Jun 2014 at 05:40AM.)   Radiology Results: [place]    31-Jul-15 12:18, [place] Bilateral  [place] Doppler Bilateral   REASON FOR EXAM:    CVA.  COMMENTS:       PROCEDURE: [place] - [place]OT[place] DOPPLER BILATERAL  -  Jun 18 2014 12:18PM     CLINICAL DATA:  Cerebrovascular accident.    EXAM:  BILATERAL CAROTID DUPLEX ULTRASOUND    TECHNIQUE:  Pearline Cables scale imaging, color Doppler and duplex ultrasound were  performed of bilateral carotid and vertebral arteries in the neck.    COMPARISON:  None.  FINDINGS:  Criteria: Quantification of carotid stenosis is based on velocity  parameters that correlate the residual internal carotid diameter  with NASCET-based stenosis levels, using the diameter of the distal  internal carotid lumen as the denominator for stenosis measurement.    The following velocity measurements were obtained:    RIGHT    ICA:  92/12 cm/sec    CCA:  794/80 cm/sec    SYSTOLIC ICA/CCA RATIO:  1.65  DIASTOLIC ICA/CCA RATIO:  5.37    ECA:  192 cm/sec    LEFT    ICA:  77/12 cm/sec    CCA:  482/70 cm/sec    SYSTOLIC ICA/CCA RATIO:  7.86    DIASTOLIC ICA/CCA RATIO:  7.54    ECA:  140 cm/sec  RIGHT CAROTID ARTERY: Mild plaque formation is noted in the proximal  right internal carotid artery consistent with less than 50% diameter  stenosis based on ultrasound and Doppler criteria.    RIGHT VERTEBRAL ARTERY:  Antegrade flow is noted.    LEFT CAROTID ARTERY: Minimal  plaque formation is noted in the  proximal left internal carotid artery consistent with less than 50%  diameter stenosis based on ultrasound and Doppler criteria.    LEFT VERTEBRAL ARTERY:  Antegrade flow is noted.     IMPRESSION:  Minimal to mild plaque formation is noted in both proximal internal  carotid arteries consistent with less than 50% diameter stenosis.  Electronically Signed    By: Sabino Dick M.D.    On: 06/18/2014 12:59         Verified By: Marveen Reeks, M.D.,  CT:    30-Jul-15 17:25, CT Head Without Contrast  CT Head Without Contrast   REASON FOR EXAM:    left facial droop  COMMENTS:       PROCEDURE: CT  - CT HEAD WITHOUT CONTRAST  - Jun 17 2014  5:25PM     CLINICAL DATA:  Left-sided facial droop since yesterday.    EXAM:  CT HEAD WITHOUT CONTRAST    TECHNIQUE:  Contiguous axial images were obtained from the base of the skull  through the vertex without intravenous contrast.    COMPARISON:  09/04/2013  FINDINGS:  Sinuses/Soft tissues: Clear paranasal sinuses and mastoid air cells.    Intracranial: Moderate low density in the periventricular white  matter likely related to small vessel disease. Mild cerebral  atrophy, with resultant ventriculomegaly. Advanced left vertebral  atherosclerosis. No mass lesion, hemorrhage, hydrocephalus, acute  infarct, intra-axial, or extra-axial fluid collection.     IMPRESSION:  1.  No acute intracranial abnormality.  2.  Cerebral atrophy and small vessel ischemic change.      Electronically Signed    By: Abigail Miyamoto M.D.    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  Electronic Signatures: SmJamison NeighborMD)  (Signed 31-Jul-15 14:30)  Authored: REFERRING PHYSICIAN, Primary Care Physician, Consult, History of Present Illness, Review of Systems, PAST MEDICAL/SURGICAL HISTORY, HOME MEDICATIONS, ALLERGIES, Social/Family History, NURSING VITAL SIGNS, Physical Exam-, LAB RESULTS, RADIOLOGY RESULTS, Recommendations   Last Updated: 31-Jul-15 14:30 by SmJamison NeighborMD)

## 2015-03-12 NOTE — H&P (Signed)
PATIENT NAME:  Steve Phillips, Steve Phillips MR#:  [id number] DATE OF BIRTH:  [birth year]  DATE OF ADMISSION:  06/17/2014  PRIMARY CARE PHYSICIAN: Dr. [name].   CHIEF COMPLAINT: Left-sided facial droop and weakness.   HISTORY OF PRESENT ILLNESS: This is a 69 year old male who presents to the Emergency Room due to a left-sided facial droop, slurred speech, and weakness that began earlier this morning. The patient says he has been having a watery eye now for about a week or so, and then this morning when he noticed that his left eye was hurting and he also had some slurring of his speech and left-sided facial droop noticed by his family members. He was, therefore, taken to urgent care who then referred him to the ER as his symptoms were suspicious for a stroke. The patient did complain of a headache, but it has improved. He denies any nausea, vomiting, chest pain, shortness of breath or any other associated symptoms presently.   REVIEW OF SYSTEMS:  CONSTITUTIONAL: No documented fever. No weight gain or weight loss.  EYES: No blurry or double vision.  EARS, NOSE, AND THROAT: No tinnitus. No postnasal drip. No redness of the oropharynx.  RESPIRATORY: No cough, no wheeze, no hemoptysis, no dyspnea.  CARDIOVASCULAR: No chest pain, no orthopnea, no palpitations, no syncope.  GASTROINTESTINAL: No nausea, vomiting, diarrhea. No abdominal pain. No melena or hematochezia.  GENITOURINARY: No dysuria, no hematuria.  ENDOCRINE: No polyuria or nocturia, heat or cold intolerance.  HEMATOLOGIC: No anemia, no bruising, no bleeding.  INTEGUMENTARY: No rashes, no lesions.  MUSCULOSKELETAL: No arthritis. No swelling. No gout.  NEUROLOGIC: Positive numbness on the left side in the upper and lower extremities compared to the right. No ataxia. No seizure activity. Positive weakness on the left side. Positive left-sided facial droop.  PSYCHIATRIC: No anxiety, no insomnia, no ADD.   PAST MEDICAL HISTORY: Consistent with  diabetes, diabetic neuropathy, hypertension, Parkinson disease, hyperlipidemia, and GERD.   ALLERGIES: CODEINE, WHICH CAUSES NAUSEA AND VOMITING.   SOCIAL HISTORY: No smoking. No alcohol abuse. No illicit drug abuse. Lives at home with his wife.   FAMILY HISTORY: Mother and father are both deceased. Father died from throat cancer. Mother was diabetic, also had breast cancer. She died from complications of pneumonia.   CURRENT MEDICATIONS: As follows:  1.  Aspirin 81 mg t.i.d.  2.  Carbidopa/levodopa 25/100 one tab t.i.d.  3.  Coreg 25 mg b.i.d.  4.  Crestor 10 mg at bedtime.  5.  Vitamin B12 monthly 1000 mg injectable. 6.  Cymbalta 60 mg daily. 7.  Hydrochlorothiazide /lisinopril 12.5/20 one tab b.i.d. 8.  Lantus 70 units at bedtime. 9.  Lyrica 150 mg b.i.d.  10. Metformin 1000 mg b.i.d.  11. Sublingual nitroglycerin as needed. 12. Norco 5/325 one tab every 8 hours as needed.  13. NovoLog 46 units t.i.d. with meals.  14. Protonix 40 mg daily b.i.d. 15. Plavix 75 mg daily. 16  Tizanidine 4 mg t.i.d. as needed.   PHYSICAL EXAMINATION: Presently is as follows:  VITAL SIGNS:  Temperature is 98.2, pulse 62, respirations 16, blood pressure 193/72, saturations 99% on room air.  GENERAL: He is a pleasant -appearing male but in no apparent distress. HEENT: Atraumatic, normocephalic. Extraocular muscles are intact. Pupils equal and reactive to light. Sclerae anicteric. No conjunctival injection. No oropharyngeal erythema.  NECK: Supple. There is no jugular venous distention. No bruits, no lymphadenopathy, no thyromegaly.  HEART: Regular rate and rhythm. No murmurs, no rubs, no clicks.  LUNGS: Clear to auscultation bilaterally. No rales. No rhonchi, no wheezes.  ABDOMEN: Soft, flat, nontender, nondistended. Has good bowel sounds. No hepatosplenomegaly appreciated.  EXTREMITIES: No evidence of any cyanosis, clubbing. Does have +1 to 2 pitting edema from the knees to the ankles bilaterally, +2  pedal and radial pulses bilaterally.  NEUROLOGICAL: The patient is alert, awake, and oriented x 3. Does have a left-sided facial droop with about 4/5 strength in the left upper and lower extremities. Reflexes are +2 bilaterally Babinski's are downgoing bilaterally. No other focal motor or sensory deficits appreciated bilaterally.  SKIN: Moist and warm with no rashes.  LYMPHATIC: There is no cervical or axillary lymphadenopathy.   LABORATORY DATA: Showed a serum glucose of 69, BUN 9, creatinine 1.5, sodium 142, potassium 3.3, chloride 107, bicarbonate 26. The patient's LFTs are within normal limits. White cell count 12.1 hemoglobin is 13.8, hematocrit 44.5, platelet count 228,000.   IMAGING: The patient did have a CT scan of the head done without contrast, which showed no acute intracranial abnormality, cerebral atrophy, and small vessel ischemic change.   ASSESSMENT AND PLAN: This is a 69 year old male with a history of diabetes, hypertension, history of previous stroke, Parkinson's disease, hyperlipidemia, history of diabetic neuropathy and gastroesophageal reflux disease who presented to the hospital with left-sided facial droop and slurred speech.   1.  Left-sided facial droop and weakness and slurred speech. The patient's symptoms are very suspicious for a cerebrovascular accident, although his CT head is negative. For now, I will admit the patient under observation, continue his aspirin, Plavix and statin. Follow q. 4 hour neurologic checks. Get a neurology consult. Will also get a MRI of his brain, echocardiogram, and carotid duplex.   2.  Diabetes. Blood sugars are stable. Continue with his Lantus and NovoLog with meals.   3.  Diabetic neuropathy. Continue Lyrica.  4.  History of Parkinson. Continue Sinemet.   5.  Gastroesophageal reflux disease. Continue Protonix.   6.  Hyperlipidemia. Continue with the patient's Crestor.   CODE STATUS: THE PATIENT IS A FULL CODE.   TIME SPENT ON  ADMISSION: 50 minutes.     ____________________________ Belia Heman. Verdell Carmine, MD vjs:ts D: 06/17/2014 19:31:14 ET T: 06/17/2014 19:42:32 ET JOB#: [id number]  cc: Belia Heman. Verdell Carmine, MD, <Dictator> Henreitta Leber MD ELECTRONICALLY SIGNED 07/02/2014 14:21

## 2015-03-13 NOTE — Consult Note (Signed)
Brief Consult Note: Diagnosis: unstable angina.   Patient was seen by consultant.   Consult note dictated.   Recommend to proceed with surgery or procedure.   Orders entered.   Discussed with Attending MD.   Comments: Known Hx of CAD s/p multiple PCIs. Recommend cardiac cath today.  Electronic Signatures: [name] (MD)  (Signed 09-Apr-13 08:08)  Authored: Brief Consult Note   Last Updated: 09-Apr-13 08:08 by [name] (MD)

## 2015-03-13 NOTE — Op Note (Signed)
PATIENT NAME:  Steve Phillips, Steve Phillips MR#:  [id number] DATE OF BIRTH:  [birth year]  DATE OF PROCEDURE:  03/14/2012  PREOPERATIVE DIAGNOSES:  1. Equinus left lower leg.  2. Ulceration plantar left fifth metatarsal.  3. Exostosis plantar left fifth metatarsal.   POSTOPERATIVE DIAGNOSES:  1. Equinus left lower leg.  2. Ulceration plantar left fifth metatarsal.  3. Exostosis plantar left fifth metatarsal.   PROCEDURES:  1. Percutaneous tendo Achilles lengthening, left lower leg.  2. Debridement excisional type left fifth metatarsal ulceration.  3. Exostectomy plantar left fifth metatarsal base, left foot.   SURGEON: Ridhaan Dreibelbis A. Vickki Muff, DPM  ANESTHESIA: LMA with local.   HEMOSTASIS: Epinephrine 1:100,000 infiltrated along the incision site. A total of 10 mL of 1% lidocaine with epinephrine was infiltrated.   COMPLICATIONS: None.   SPECIMEN: Bone fifth metatarsal plantar left foot.   ESTIMATED BLOOD LOSS: Less than 25 mL.   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 He has undergone conservative treatment and presents today for surgical intervention. All risks, benefits, alternatives, and complications associated with surgery were discussed with the patient in full and consent has been given.   DESCRIPTION OF PROCEDURE: Patient was brought into the Operating Room, placed on the operating table in the supine position. Laryngeal mask anesthesia was then initiated by anesthesia. The left lower extremity was prepped and draped in usual sterile fashion. A local block was placed around the fifth metatarsal and Achilles tendon region. After sterile prep and drape, attention was directed initially to the posterior aspect of the Achilles tendon at its insertion. At 1 and 3 cm two hemisections percutaneously were performed of the Achilles tendon. Good excursion was noted and lengthening was noted with this. Each  incision was then closed with a 5-0 nylon. Attention was then directed to the lateral fifth metatarsal base where a lateral incision was made longitudinally over the fifth metatarsal. Sharp dissection was carried down to the periosteum as well as the lateral band of the peroneal tendon on the lateral aspect of the fifth metatarsal. An incision was then made creating flaps dorsal and plantar. There was noted to be a large plantar exostosis that was noted on x-ray. Next, with a power saw this was then taken down from lateral to medial. This was then smoothed with a power rasp. Good reduction of prominence was noted on x-ray as well as with palpation. This was then flushed with copious amounts of irrigation and layered closure was performed with a 4-0 Vicryl for the deeper and subcutaneous tissues and a 3-0 nylon for skin. Next, with a 15 blade an excisional debridement was performed of a 2 cm ulceration to the plantar fifth metatarsal. This was taken down to the subcutaneous tissue. All areas were then cleansed appropriately and a large bulky sterile dressing was placed on the patient's left foot and ankle. He tolerated the procedure and anesthesia well and was transported from the Operating Room to the PAC-U with all vital signs stable and neurovascular status intact. I will see him in the outpatient clinic in 5 to 7 days. He is going to remain in the equalizer walker boot. He has crutches and a wheelchair. Will get him pain medicine for home as well.   ____________________________ [name]. Vickki Muff, DPM jaf:cms D: 03/14/2012 08:30:28 ET T: 03/14/2012 11:54:23 ET JOB#: [id number]  cc: Larkin Ina A. Vickki Muff, DPM, <Dictator>  Impact DPM ELECTRONICALLY SIGNED 03/28/2012 10:42

## 2015-03-13 NOTE — Discharge Summary (Signed)
PATIENT NAME:  Steve Phillips, Steve Phillips MR#:  [id number] DATE OF BIRTH:  [birth year]  DATE OF ADMISSION:  02/25/2012 DATE OF DISCHARGE:  02/27/2012  PRIMARY CARE PHYSICIAN: [name], MD   CARDIOLOGIST: Dr. Curly Rim at [place]:  1. Unstable angina requiring a stent with history of coronary artery disease.  2. Hypertension.  3. Diabetes.  4. Hyperlipidemia.  5. Cellulitis with ulcer on the foot.  6. Depression.  7. Headache.   MEDICATIONS ON DISCHARGE:  1. Zofran 4 mg 1 tablet every eight hours as needed for nausea.  2. Lantus 80 units subcutaneous injection twice a day.  3. Plavix 75 mg daily.  4. Lasix 20 mg daily.  5. Coreg 25 mg twice a day.  6. Metformin 1000 mg twice a day, can be restarted as outpatient.  7. NovoLog sliding scale 10 to 15 units breakfast and lunch and up to 35 units with evening meal. 8. Percocet 5/325 one tablet every six hours as needed for pain.  9. Exforge 10/320 one tablet daily.  10. Aspirin 81 mg daily.  11. Cymbalta 60 mg daily.  12. Imdur 30 mg daily.  13. Hydrochlorothiazide/lisinopril 12.5/20 one tablet daily.  14. Nitroglycerin 1 tablet daily.  15. Protonix 40 mg daily.  16. Tizanidine 4 mg at bedtime.  17. Lipitor 10 mg p.o. nightly.  18. Debrox otic solution five drops right ear twice a day for five days.   DIET: Low sodium diet, 1800 ADA diet.   FOLLOW-UP: Can follow-up with either his cardiologist or Dr. Fletcher [name] in two weeks and Dr. [name] in 1 to 2 weeks.   REASON FOR ADMISSION: The patient was admitted 02/25/2012 and discharged 02/27/2012, came in with chest tightness.   HISTORY OF PRESENT ILLNESS: This is a 69 year old man with coronary artery disease, diabetes, hypertension, and ulcer on the foot who came in with chest tightness 6 out of 10 in intensity. He was admitted with unstable angina. The patient was already on aspirin, Plavix, Imdur, and Coreg. I started him on Lovenox. I paged Dr. Fletcher [name] of  Cardiology from [facility] to consult. The patient was brought to the Cardiac Cath Lab on 02/26/2012. The patient had a stent placement to the diagonal artery.   LABORATORY AND RADIOLOGICAL DATA DURING THE HOSPITAL COURSE: EKG showed normal sinus rhythm, flattening of T waves laterally and inferiorly. Troponin negative. Glucose 259, BUN 11, creatinine 0.93, sodium 137, potassium 3.8, chloride 98, CO2 28, calcium 8.9, white blood cell count 11.3, hemoglobin and hematocrit 14.7 and 45.0, platelet count 228. Chest x-ray showed no acute changes. Next two sets of troponins were negative. LDL 65, HDL 24, triglycerides 237.   Cardiac cath showed proximal LAD 20% stenosis, mid LAD 40% stenosis and then later on 20% stenosis, and a third lesion 30% stenosis, DM1 90% stenosis, OM2 50% stenosis, ostial 20% stenosis, mid RCA 30% stenosis. A drug-eluting stent of 90% stenosis in V1 with 0% residual stenosis.   Creatinine upon discharge 0.94. Blood pressure upon discharge 149/67.   HOSPITAL COURSE PER PROBLEM LIST:  1. For the patient's unstable angina and history of coronary artery disease, with the patient's story he was taken to the Cardiac Cath Lab and a stent was placed. The patient is already on good cardiac medications including aspirin, Plavix, Coreg, and Imdur which was continued. Lovenox was added prior to the cardiac catheterization. The patient did not have any symptoms of chest pain after stent placement. The patient is chest pain  free upon discharge. He can either follow-up with his cardiologist or Dr. Fletcher [name] whichever he prefers.  2. Hypertension. Blood pressure variable during the hospital stay, 149/67 upon discharge.  3. For his diabetes, his Lantus was decreased secondary to being n.p.o. for the procedure, was increased back up to his usual dose. His metformin was held during the hospital stay but can be restarted as outpatient.  4. For his hyperlipidemia, cholesterol profile is actually good but  standard of care should be on a cholesterol medication. Will try low dose Lipitor.  5. For his cellulitis with ulcer on the foot, he will follow-up with Podiatry. He has a dressing on that which he can change daily. He will finish up the Keflex that he already has a prescription for.  6. For his depression, he is on Cymbalta.  7. For headache, he is on tizanidine at night.  8. For earwax, he can do the Debrox otic solution.   TIME SPENT ON DISCHARGE: 35 minutes.   ____________________________ Tana Conch. Leslye Peer, MD rjw:drc D: 02/27/2012 15:32:39 ET T: 02/28/2012 12:20:07 ET JOB#: [id number]  cc: Tana Conch. Leslye Peer, MD, <Dictator> Eduard Clos. Gilford Rile, MD Mertie Clause. Fletcher [name], MD Dr. Curly Rim at [facility] MD ELECTRONICALLY SIGNED 02/29/2012 16:50

## 2015-03-13 NOTE — H&P (Signed)
PATIENT NAME:  Steve Phillips, Steve Phillips MR#:  [id number] DATE OF BIRTH:  Feb 22, 1946  DATE OF ADMISSION:  02/25/2012  PRIMARY CARE PHYSICIAN: [name], MD    CARDIOLOGIST: Dr. Curly Rim, [facility]   CHIEF COMPLAINT: Chest tightness.   HISTORY OF PRESENT ILLNESS: This is a 69 year old man with history of coronary artery disease, diabetes, hypertension, and ulcer on the foot. He presents with chest tightness starting this morning while he was out doing some activity in the building, described as 6 out of 10 in intensity, was coming and going and then more constant as the day went on, described as a tightness in the upper chest associated with some shortness of breath and weakness. No diaphoresis. Positive for nausea. In the ER he was given two nitroglycerin and now the pain is down to 3 out of 10 in intensity. In the ER, he does have a negative troponin. He does have some nonspecific T wave abnormalities inferiorly and laterally. As per the family, last cardiac cath was over at [name] within two years and did see small vessel disease, not stent at this time.   PAST MEDICAL HISTORY:  1. Hypertension.  2. Diabetes.  3. Coronary artery disease. 4. Ulcer on the foot. 5. Depression. 6. Headache.   PAST SURGICAL HISTORY:  1. Cholecystectomy.  2. Appendectomy.  3. Fracture on the leg. 4. Fracture on the foot.  5. Back surgery.   ALLERGIES: Codeine.   MEDICATIONS:  1. Keflex 500 mg t.i.d.  2. Lantus 80 units b.i.d.  3. NovoLog Pen between 15 and 25 units q.a.c.  4. Metformin 1000 mg b.i.d.  5. Lisinopril/HCTZ 20/12.5 one tablet b.i.d.  6. Coreg 25 mg b.i.d.  7. Imdur 60 mg daily.  8. Plavix 75 mg daily.  9. Aspirin 81 mg daily.  10. Protonix 40 mg daily.  11. Cymbalta 60 mg daily.  12. Norvasc 10 mg at bedtime.  13. Nitroglycerin p.r.n.  14. Tizanidine 4 mg in the p.m. as needed for headache.   SOCIAL HISTORY: No smoking. No alcohol. No drug use. Retired from [employer].  FAMILY HISTORY:  Mother died of pneumonia, had breast cancer, diabetes, and hypertension. Father died of throat cancer.   REVIEW OF SYSTEMS: CONSTITUTIONAL: No fever, chills, or sweats. Trying to lose weight. No weight gain. Positive for fatigue. EYES: Does have glasses. EARS, NOSE, MOUTH, AND THROAT: Decreased hearing. Positive for runny nose. CARDIOVASCULAR: Positive for chest pain. Positive for palpitations. RESPIRATORY: Positive for shortness of breath. No cough. No sputum. No hemoptysis. GASTROINTESTINAL: Positive for nausea. No vomiting. No abdominal pain. No diarrhea. No constipation. No bright red blood per rectum. No melena. GENITOURINARY: No burning on urination. No hematuria. MUSCULOSKELETAL: No joint pain or muscle pain. INTEGUMENTARY: No rashes or eruptions except for the ulcer on his left foot which does bleed. NEUROLOGIC: No fainting or blackouts. PSYCHIATRIC: On medication for depression. ENDOCRINE: No thyroid problems. HEMATOLOGIC/LYMPHATIC: No anemia. No easy bruising or bleeding.   PHYSICAL EXAMINATION:   VITAL SIGNS: Temperature 95.6, pulse 75, respirations 20, blood pressure 190/83, pulse oximetry 97%.   GENERAL: No respiratory distress.   EYES: Conjunctivae and lids normal. Pupils equal, round, and reactive to light. Extraocular muscles intact. No nystagmus.   EARS, NOSE, MOUTH, AND THROAT: Tympanic membrane no erythema on the left, wax on the right. Throat no erythema. No exudate seen. Lips and gums no lesions.   NECK: No JVD. No bruits. No lymphadenopathy. No thyromegaly. No thyroid nodules palpated.   RESPIRATORY: Lungs clear to  auscultation. No use of accessory muscles to breathe. No rhonchi, rales, or wheeze heard.   CARDIOVASCULAR: S1, S2 normal. No gallops, rubs, or murmurs heard. Carotid upstroke 2+ bilaterally. No bruits. Dorsalis pedis pulses 2+ bilaterally. Trace edema of the lower extremity.   ABDOMEN: Soft, nontender. No organomegaly/splenomegaly. Normoactive bowel sounds. No  masses felt.   LYMPHATIC: No lymph nodes in the neck.   MUSCULOSKELETAL: No clubbing. Trace edema. No cyanosis.   SKIN: Small ulcer around the left foot.   NEUROLOGIC: Cranial nerves II through XII grossly intact. Deep tendon reflexes 1+ bilateral lower extremities.   PSYCHIATRIC: The patient is oriented to person, place, and time.   LABORATORY AND RADIOLOGICAL DATA: Chest x-ray showed no acute changes. White blood cell count 11.3, hemoglobin and hematocrit 14.7 and 45.0, platelet count 228, glucose 259, BUN 11, creatinine 0.93, sodium 137, potassium 3.8, chloride 98, CO2 28, calcium 8.9. Troponin negative. EKG normal sinus rhythm, 80 beats per minute, nonspecific T wave changes, laterally flattening and inferiorly flattening.   ASSESSMENT AND PLAN:  1. Unstable angina. The patient is already on medical management including aspirin. Plavix, Imdur, and Coreg. Will start Lovenox 1 mg/kg sub-Q q.12 hours. Trying to call [facility] for consultation and opinion on testing, whether a stress test or a cardiac cath will be needed. Will try to obtain records from cardiac cath at [facility]. Will get serial cardiac enzymes and put on telemetry.  2. Hypertension, malignant. Will continue usual medications, on quite a few b.i.d. medications which can be given. Continue to monitor on usual meds, add if needed.  3. Diabetes. Will give a lower dose of Lantus this evening if the patient will be n.p.o. after midnight for testing and do sliding scale for now.  4. Ulcer on foot with recent cellulitis. On Keflex. Will continue.  5. Headache. On Zanaflex at night.  6. Depression. On Cymbalta.  TIME SPENT ON ADMISSION: 55 minutes.   CODE STATUS: The patient is a FULL CODE.   ____________________________ Tana Conch. Leslye Peer, MD rjw:drc D: 02/25/2012 17:28:52 ET T: 02/25/2012 17:51:10 ET JOB#: [id number]  cc: Tana Conch. Leslye Peer, MD, <Dictator> Eduard Clos. Gilford Rile, MD Dr. Curly Rim, [place] MD ELECTRONICALLY SIGNED 02/29/2012 16:22

## 2015-03-13 NOTE — Consult Note (Signed)
PATIENT NAME:  Steve Phillips, Steve Phillips MR#:  [id number] DATE OF BIRTH:  02-Feb-1946  DATE OF CONSULTATION:  02/26/2012  REFERRING PHYSICIAN:   CONSULTING PHYSICIAN:  Muhammad A. Fletcher [name], MD  PRIMARY CARE PHYSICIAN: Dr. [name]  CARDIOLOGIST: Dr. Curly Rim at [place]: Unstable angina.   HISTORY OF PRESENT ILLNESS: This is a 69 year old gentleman with extensive cardiac history. He has chronic ischemic heart disease. He has underwent multiple PCI's in the past starting about 12 years ago. No revascularization in the last five years. His most recent cardiac catheterization according to him was done about two years ago at [facility]. At that time he was told that there was small vessel disease. He was treated medically. He has been on medical therapy for chronic angina and his symptoms actually have been well controlled. He presented yesterday with prolonged episode of substernal chest tightness which started as he was trying to do some work outside the house. Tightness was substernal and left-sided without radiation. It was associated with significant dyspnea but no diaphoresis. He felt nauseous but did not have any vomiting. The discomfort was partially relieved with nitroglycerin. The patient continued to have chest pain for hours. His cardiac enzymes have been negative. ECG did not show any acute changes. He was admitted to telemetry. He continued to have low level of chest tightness and had required more nitroglycerin. This is somewhat unusual for him given that his chest pain and angina have actually been controlled over the last two years.   PAST MEDICAL HISTORY:  1. Coronary artery disease as outlined above.  2. Hypertension.  3. Type 2 diabetes.  4. Ulcer on the left foot.  5. Depression.  6. Headache.   PAST SURGICAL HISTORY:  1. Cholecystectomy.  2. Appendectomy.  3. Fracture on the leg.  4. Back surgery.  5. Multiple cardiac catheterizations and stent  placements.   ALLERGIES: Codeine.   HOME MEDICATIONS:  1. Keflex 500 mg 3 times daily.  2. Insulin Lantus 8 units twice daily.  3. NovoLog pen 15 units and 25 units before meals.  4. Metformin 1000 mg twice daily.  5. Lisinopril hydrochlorothiazide 20/12.5 mg twice daily.  6. Coreg 25 mg twice a day.  7. Imdur 60 mg daily.  8. Plavix 75 mg daily.  9. Aspirin 81 mg daily.  10. Protonix 40 mg once daily.  11. Cymbalta 60 mg daily.  12. Norvasc 10 mg daily.  13. Nitrostat sublingual as needed.  14. Tizanidine 4 mg as needed for headache.   SOCIAL HISTORY: Negative for smoking, alcohol or recreational drug use. He is retired from [employer]. He is married and lives with his wife.   FAMILY HISTORY: Negative for premature coronary artery disease.   REVIEW OF SYSTEMS: A 10 point review of systems was performed. It is negative other than what is mentioned in the history of present illness.   PHYSICAL EXAMINATION:  GENERAL: The patient appears to be at his stated age and in no acute distress.   VITAL SIGNS: Temperature 97.4, pulse 67, respiratory rate 20, blood pressure 132/71, oxygen saturation 96% on 2 liters nasal cannula.   HEENT: Normocephalic, atraumatic.   NECK: No jugular venous distention or carotid bruits.   RESPIRATORY: Normal respiratory effort with no use of accessory muscles. Auscultation reveals normal breath sounds.   CARDIOVASCULAR: Normal PMI. Normal S1 and S2 with no gallops or murmurs.   ABDOMEN: Benign, nontender, nondistended.   EXTREMITIES: No clubbing, cyanosis, or edema.  The patient has a wound on the left leg which is wrapped. This was not examined.   SKIN: Warm and dry with no rash.   PSYCH: He is alert, oriented x3 with normal mood and affect.   LABORATORY, DIAGNOSTIC, AND RADIOLOGICAL DATA: His labs showed negative cardiac enzymes. His white cell count was slightly elevated at 11.3 which normalized this morning. Renal function is normal with creatinine of  0.8. ECG showed normal sinus rhythm with nonspecific T wave changes.   IMPRESSION:  1. Unstable angina with known history of coronary artery disease.  2. Hypertension which is well controlled.  3. Type 2 diabetes.  4. Left foot ulcer with known history of diabetic neuropathy.   RECOMMENDATIONS: The patient had a prolonged episode of chest tightness with minimal activities yesterday. This is highly suggestive of unstable angina. There is no evidence of myocardial injury by cardiac enzymes. However, due to his extensive ischemic heart disease and current symptoms, I recommend proceeding with cardiac catheterization and possible coronary intervention. Risks, benefits and alternatives were discussed with the patient. He is still having minimal chest tightness this morning and I do not think proceeding with a stress test at this point would be helpful. I recommend continuing current medications for coronary artery disease including aspirin and Plavix. His blood pressure seems to be well controlled.        He does have an ulcer on the left foot. The patient should be evaluated for possible underlying peripheral arterial disease with an ABI at some point.   ____________________________ Mertie Clause. Fletcher [name], MD maa:cms D: 02/26/2012 08:20:01 ET T: 02/26/2012 10:16:46 ET JOB#: [id number]  cc: Muhammad A. Fletcher [name], MD, <Dictator> Eduard Clos. Gilford Rile, MD Dr. Curly Rim, cardiologist, at [place] SIGNED 03/08/2012 14:06

## 2015-03-14 ENCOUNTER — Ambulatory Visit (INDEPENDENT_AMBULATORY_CARE_PROVIDER_SITE_OTHER): Payer: Medicare Other | Admitting: Cardiovascular Disease

## 2015-03-14 ENCOUNTER — Encounter: Payer: Self-pay | Admitting: Cardiovascular Disease

## 2015-03-14 VITALS — BP 168/82 | HR 62 | Ht 72.0 in | Wt 255.5 lb

## 2015-03-14 DIAGNOSIS — I25110 Atherosclerotic heart disease of native coronary artery with unstable angina pectoris: Secondary | ICD-10-CM | POA: Diagnosis not present

## 2015-03-14 DIAGNOSIS — I10 Essential (primary) hypertension: Secondary | ICD-10-CM

## 2015-03-14 DIAGNOSIS — R0602 Shortness of breath: Secondary | ICD-10-CM

## 2015-03-14 DIAGNOSIS — E785 Hyperlipidemia, unspecified: Secondary | ICD-10-CM

## 2015-03-14 MED ORDER — PREDNISONE 50 MG PO TABS: 50.0000 mg | ORAL_TABLET | Freq: Every day | ORAL | Status: DC

## 2015-03-14 MED ORDER — SPIRONOLACTONE 25 MG PO TABS: 25.0000 mg | ORAL_TABLET | Freq: Every day | ORAL | Status: DC

## 2015-03-14 NOTE — Assessment & Plan Note (Signed)
Continue treatment with simvastatin with a target LDL of less than 70. He could not afford Crestor. The dose cannot be increased given that he is on amlodipine.

## 2015-03-14 NOTE — Patient Instructions (Signed)
Medication Instructions:  Start Aldactone ( 25 mg ) daily sent into [facility]  Labwork: Bmet in one week  Testing/Procedures: None  Follow-Up: 2 months with Dr.Arida  Any Other Special Instructions Will Be Listed Below (If Applicable).

## 2015-03-14 NOTE — Assessment & Plan Note (Signed)
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 Continue dual antiplatelet therapy for at least one year. Unfortunately, Plavix cannot be interrupted safely at the present time for back surgery. I am hoping that he will be able to start cardiac rehabilitation once his back issues are improved. He used to do water aerobics which might be helpful.

## 2015-03-14 NOTE — Progress Notes (Signed)
HPI  This is a 69 year old male who is here today for followup visit regarding coronary artery disease. He has known history of coronary artery disease status post multiple PCI in the past at [facility]. He presented in April of 2013 with chest pain. He underwent cardiac catheterization which showed patent stents. However, there was a 90% stenosis in the proximal first diagonal. There was 40% disease in the mid LAD and moderate disease in the left circumflex. He underwent an angioplasty and drug-eluting stent placement to the diagonal without complications.  he has known history of refractory hypertension. Renal artery duplex ultrasound showed no evidence of renal artery stenosis. He was seen recently for chest pain with minimal activities worrisome for class III angina. I proceeded with cardiac catheterization early this month which showed patent stents in the RCA, left circumflex and first diagonal. There was severe in-stent restenosis in the mid LAD. I performed successful angioplasty and drug-eluting stent placement to the mid LAD. The patient was noted to have diffuse diabetic vessels. Chest pain resolved since then. However, he started having left-sided back pain and leg numbness due to herniated disc which was noted on MRI. This has caused him significant pain and elevation in blood pressure. He was hospitalized for elevated blood pressure. He had an episode of hypotension that required rapid response but no CPR. He had an epidural steroid injection done recently with partial relief.  Allergies  Allergen Reactions  . Codeine Nausea Only    Derivatives Nausea.  . Tramadol     unknown  . Trazodone And Nefazodone Nausea Only     Current Outpatient Prescriptions on File Prior to Visit  Medication Sig Dispense Refill  . acetaminophen (TYLENOL) 325 MG tablet Take 2 tablets (650 mg total) by mouth every 4 (four) hours as needed for headache or mild pain.    Marland Kitchen amLODipine (NORVASC) 5  MG tablet Take 1 tablet (5 mg total) by mouth daily. 90 tablet 3  . aspirin 81 MG tablet Take 81 mg by mouth 3 (three) times daily.    . carbidopa-levodopa (SINEMET IR) 25-100 MG per tablet Take 2 tablets by mouth 3 (three) times daily. 270 tablet 2  . carvedilol (COREG) 25 MG tablet TAKE ONE TABLET BY MOUTH TWICE DAILY 180 tablet 1  . clopidogrel (PLAVIX) 75 MG tablet Take 1 tablet (75 mg total) by mouth daily with breakfast. 90 tablet 1  . cyanocobalamin (,VITAMIN B-12,) 1000 MCG/ML injection Inject 1,000 mcg into the muscle once.    . DULoxetine (CYMBALTA) 60 MG capsule TAKE 1 CAPSULE DAILY 90 capsule 3  . glucose blood (FREESTYLE TEST STRIPS) test strip Use to check blood sugar up to three times daily 300 each 1  . HYDROcodone-acetaminophen (NORCO/VICODIN) 5-325 MG per tablet Take 1-2 tablets by mouth every 8 (eight) hours as needed. 90 tablet 0  . insulin aspart (NOVOLOG) 100 UNIT/ML injection Inject 48-50 units 3 times a day with meals as instructed. (Patient taking differently: Inject 46-54 units 3 times a day with meals as instructed.) 140 mL 1  . Insulin Glargine (TOUJEO SOLOSTAR) 300 UNIT/ML SOPN Inject 55 Units into the skin 2 (two) times daily at 8 am and 10 pm. (Patient taking differently: Inject 70 Units into the skin 2 (two) times daily at 8 am and 10 pm. ) 27 pen 1  . lisinopril-hydrochlorothiazide (PRINZIDE,ZESTORETIC) 20-12.5 MG per tablet Take 1 tablet by mouth 2 (two) times daily. 180 tablet 1  . metFORMIN (GLUCOPHAGE) 1000  MG tablet TAKE ONE TABLET BY MOUTH TWICE DAILY 180 tablet 1  . nitroGLYCERIN (NITROSTAT) 0.4 MG SL tablet Place 1 tablet (0.4 mg total) under the tongue every 5 (five) minutes as needed for chest pain. 30 tablet 6  . pantoprazole (PROTONIX) 40 MG tablet Take 1 tablet (40 mg total) by mouth 2 (two) times daily. 180 tablet 3  . pregabalin (LYRICA) 150 MG capsule Take 1 capsule (150 mg total) by mouth 2 (two) times daily. 180 capsule 2  . simvastatin (ZOCOR) 20 MG  tablet Take 1 tablet (20 mg total) by mouth at bedtime. 90 tablet 3  . tiZANidine (ZANAFLEX) 4 MG tablet Take 1 tablet (4 mg total) by mouth every 8 (eight) hours as needed. for pain 63 tablet 0   No current facility-administered medications on file prior to visit.     Past Medical History  Diagnosis Date  . Shingles   . Diabetes mellitus   . Hyperlipidemia   . Broken leg     left...s/p pins  . Lower leg pain     chronic ,left leg  . Acute angina   . Depression   . CAD (coronary artery disease)     s/p multiple PCI with stent RCA,LAD and obtuse marginal,followed @ [facility] on the accord study.., cath 02/2012 90% D1, s/p drug-eluting stent Dr. Fletcher [name]  . Falls 09/2011  . Dizziness   . Shortness of breath   . Headache(784.0)   . Hypertension     dr Jerilynn Mages Audelia Acton     [facility] in [place]  . Stroke   . Bell's palsy   . Parkinson disease   . Neuropathy      Past Surgical History  Procedure Laterality Date  . Appendectomy    . Cholecystectomy    . Left leg surgery    . Left foot surgery    . Drainage port in left testicle    . Cardiac catheterization  02/2012  . Back surgery    . Anterior cervical decomp/discectomy fusion N/A 04/30/2013    Procedure: ANTERIOR CERVICAL DECOMPRESSION/DISCECTOMY FUSION 2 LEVELS;  Surgeon: Faythe Ghee, MD;  Location: [facility];  Service: Neurosurgery;  Laterality: N/A;  Cervical four-five, Cervical six-seven anterior cervical decompression fusion with trabecular metal cage and plate  . Left heart catheterization with coronary angiogram N/A 02/23/2015    Procedure: LEFT HEART CATHETERIZATION WITH CORONARY ANGIOGRAM;  Surgeon: [name], MD;  Location: [facility];  Service: Cardiovascular;  Laterality: N/A;     Family History  Problem Relation Age of Onset  . Cancer Mother     BREAST  . Heart disease Mother     STENT  . Cancer Father     THROAT  . Heart disease Sister     STENT; HTN  . Heart attack Brother   . Hypertension Brother   .  Hypertension Sister      History   Social History  . Marital Status: Married    Spouse Name: N/A  . Number of Children: N/A  . Years of Education: N/A   Occupational History  . Not on file.   Social History Main Topics  . Smoking status: Former Smoker    Quit date: 11/08/1971  . Smokeless tobacco: Never Used  . Alcohol Use: No  . Drug Use: No  . Sexual Activity: Not on file   Other Topics Concern  . Not on file   Social History Narrative      PHYSICAL EXAM  BP 168/82 mmHg  Pulse 62  Ht 6' (1.829 m)  Wt 255 lb 8 oz (115.894 kg)  BMI 34.64 kg/m2 Constitutional: He is oriented to person, place, and time. He appears well-developed and well-nourished. No distress.  HENT: No nasal discharge.  Head: Normocephalic and atraumatic.  Eyes: Pupils are equal and round. Right eye exhibits no discharge. Left eye exhibits no discharge.  Neck: Normal range of motion. Neck supple. No JVD present. No thyromegaly present.  Cardiovascular: Normal rate, regular rhythm, normal heart sounds and. Exam reveals no gallop and no friction rub. No murmur heard.  Pulmonary/Chest: Effort normal and breath sounds normal. No stridor. No respiratory distress. He has no wheezes. He has no rales. He exhibits no tenderness.  Abdominal: Soft. Bowel sounds are normal. He exhibits no distension. There is no tenderness. There is no rebound and no guarding.  Musculoskeletal: Normal range of motion. He exhibits no edema and no tenderness.  Neurological: He is alert and oriented to person, place, and time. Coordination normal.  Skin: Skin is warm and dry. No rash noted. He is not diaphoretic. No erythema. No pallor.  Psychiatric: He has a normal mood and affect. His behavior is normal. Judgment and thought content normal.     EKG: Sinus  Rhythm  -Nonspecific ST depression  -Nondiagnostic.   ABNORMAL      ASSESSMENT AND PLAN

## 2015-03-14 NOTE — Assessment & Plan Note (Signed)
He has refractory hypertension worsened recently by uncontrolled pain. No evidence of renal artery stenosis. I elected to add spironolactone 25 mg once daily. Check basic metabolic profile in one week.

## 2015-03-14 NOTE — Telephone Encounter (Signed)
Pt sched to be seen today at 3:15.

## 2015-03-15 DIAGNOSIS — I10 Essential (primary) hypertension: Secondary | ICD-10-CM | POA: Diagnosis not present

## 2015-03-15 DIAGNOSIS — I2510 Atherosclerotic heart disease of native coronary artery without angina pectoris: Secondary | ICD-10-CM | POA: Diagnosis not present

## 2015-03-15 DIAGNOSIS — E119 Type 2 diabetes mellitus without complications: Secondary | ICD-10-CM | POA: Diagnosis not present

## 2015-03-15 DIAGNOSIS — G629 Polyneuropathy, unspecified: Secondary | ICD-10-CM | POA: Diagnosis not present

## 2015-03-15 DIAGNOSIS — M79605 Pain in left leg: Secondary | ICD-10-CM | POA: Diagnosis not present

## 2015-03-15 DIAGNOSIS — R296 Repeated falls: Secondary | ICD-10-CM | POA: Diagnosis not present

## 2015-03-18 DIAGNOSIS — I10 Essential (primary) hypertension: Secondary | ICD-10-CM | POA: Diagnosis not present

## 2015-03-18 DIAGNOSIS — Z6834 Body mass index (BMI) 34.0-34.9, adult: Secondary | ICD-10-CM | POA: Diagnosis not present

## 2015-03-18 DIAGNOSIS — M5416 Radiculopathy, lumbar region: Secondary | ICD-10-CM | POA: Diagnosis not present

## 2015-03-20 DIAGNOSIS — Z09 Encounter for follow-up examination after completed treatment for conditions other than malignant neoplasm: Secondary | ICD-10-CM | POA: Insufficient documentation

## 2015-03-20 DIAGNOSIS — M5416 Radiculopathy, lumbar region: Secondary | ICD-10-CM | POA: Insufficient documentation

## 2015-03-20 NOTE — Assessment & Plan Note (Signed)
Pt is complying with all d/c recommendations except for diet. Will continue to see Dr. Fletcher [name] for follow up, Dr. Sharlet Salina for another nerve block on 5/11, PT at home.

## 2015-03-20 NOTE — H&P (Signed)
PATIENT NAME:  Steve Phillips, Steve Phillips MR#:  [id number] DATE OF BIRTH:  1946-05-20  DATE OF ADMISSION:  03/04/2015  ADDENDUM:  MRI of the lumbar spine was done on April 12 as an outpatient, it shows leftward disk protrusion at L3-L4 and mild facet hypertrophy without significant stenosis. Minimal left subarticular foraminal narrowing at L4-L5 is unchanged. Leftward disk protrusion and annular tear L5-S1, potentially contacts the exiting left L5 nerve root.  Ultrasound of the left lower extremity, no evidence of DVT within the left lower extremity.    ____________________________ Nicholes Mango, MD ag:bu D: 03/04/2015 18:07:33 ET T: 03/04/2015 18:43:48 ET JOB#: [id number]  cc: Nicholes Mango, MD, <Dictator> Nicholes Mango MD ELECTRONICALLY SIGNED 03/12/2015 17:49

## 2015-03-20 NOTE — H&P (Signed)
PATIENT NAME:  Steve Phillips, Steve Phillips MR#:  [id number] DATE OF BIRTH:  February 24, 1946  DATE OF ADMISSION:  03/04/2015  PRIMARY CARE PHYSICIAN: Jackolyn Confer, MD   PRIMARY CARDIOLOGY: [name], MD   PRIMARY PAIN MANAGEMENT:  Varney Baas, DO   REFERRING EMERGENCY DEPARTMENT PHYSICIAN:  Joanne Gavel, MD   CHIEF COMPLAINT: Severe back pain and high blood pressure.   HISTORY OF PRESENT ILLNESS:  The patient is a 69 year old Caucasian male with a past medical history of diabetes mellitus, coronary artery disease, and hypertension. Recently had cardiac catheterization done last Wednesday, by Dr. Fletcher [name], and had one coronary artery stent placement done. Following that he was discharged home but since the procedure he started having severe low back pain and left lower extremity pain. The patient was followed up by Dr. Fletcher [name]. Ultrasound was done x 2, and no abnormalities were diagnosed. The patient was still having severe low back pain and left lower extremity pain from last Wednesday, April 6. He was referred to a pain management doctor, at the [facility], and patient was seen by Dr. Sharlet Salina on last Monday and had MRA of lumbar spine done.  Dr. Sharlet Salina, the pain management doctor has scheduled another block today, but unfortunately because his blood pressure was elevated, he was sent over to the ED. His initial blood pressure in the ED was at 226/101. He was given multiple boluses of hydralazine with no significant improvement, as blood pressure was still elevated at 232/102. After trying several doses of hydralazine boluses and p.o. medications, patient is started on Cardene drip.  The patient is responding to Cardene drip but is still in severe excruciating pain in the low back and left lower extremity. He is complaining of dull headache but denies any blurry vision, black spots, or floaters.   CT angiogram of the abdomen and pelvis was performed as recommended by neurosurgery and no aneurysm or  dissection was noticed. Hospitalist team is called to admit the patient.   During my examination, patient is still uncomfortable with the back pain and asking for pain medication. His wife was at bedside. He is having good bowel movements as reported by the patient's wife.   PAST MEDICAL HISTORY: Coronary artery disease, status post stent placement on April 6, by Dr. Fletcher [name]; hypertension, diabetes mellitus, diabetic neuropathy, also Parkinson disease, hyperlipidemia and GERD.   PAST SURGICAL HISTORY: Recent history of cardiac stent placement on April 6.  ALLERGIES: ALLERGIC TO CODEINE, WHICH CAUSES NAUSEA AND VOMITING.   PSYCHOSOCIAL HISTORY: Lives at home with wife; no smoking, alcohol or illicit drug uses.   FAMILY HISTORY: Mother and father deceased; father deceased from throat cancer; mother was diabetic and had breast cancer, she died from complications of pneumonia.   REVIEW OF SYSTEMS: CONSTITUTIONAL: Denies any fever, fatigue, or weakness; complaining of severe low back pain.  EYES: Denies blurry vision, double vision. No black spots or floaters.  EARS, NOSE, AND THROAT: Denies epistaxis or discharge.  RESPIRATORY: Denies cough, COPD.  CARDIOVASCULAR: No chest pain or palpitations.  GASTROINTESTINAL: Has nausea from the pain medication; denies any vomiting, diarrhea, denies any abdominal pain.  GENITOURINARY: No dysuria or hematuria.  ENDOCRINE: Denies polyuria, nocturia. Has diabetes mellitus.  HEMATOLOGIC AND LYMPHATIC: No anemia, easy bruising, bleeding.  INTEGUMENTARY: No acne, rash, lesions.  MUSCULOSKELETAL: Complaining of severe low back pain and left lower extremity pain and started after cardiac catheterization on April 6.  NEUROLOGIC: Denies any vertigo, ataxia.  PSYCHIATRIC: No ADD, OCD.  HOME MEDICATIONS: Aspirin 81 mg p.o. once daily, Plavix 75 mg p.o. once daily, Percocet 325/5 two tablets every 8 hours as needed for pain; amlodipine 5 mg p.o. once daily in a.m.,  carbidopa/levodopa 25/100 two capsules p.o. 3 times a day; Coreg 25 mg p.o. 2 times a day, Crestor 10 mg once daily, cyanocobalamin 1000 mcg/mL injectable solution once a month; duloxetine 60 mg delayed release capsule 1 capsule p.o. once daily; hydrochlorothiazide with lisinopril 12.5/20 one tablet p.o. 2 times a day; Lyrica 150 mg 2 capsules p.o. 2 times a day, metformin 1000 mg 2 times a day, nitroglycerin 0.4 mg sublingually every 5 minutes as needed for chest pain x 3; pantoprazole 40 mg delayed-release tablet 2 times a day, NovoLog  54 units subcutaneously 3 times a day with meals, tizanidine 4 mg 1 tablet p.o. 3 times a day as needed for muscle spasms; Tuojeo SoloSTAR 300 units/mL subcutaneously 2 times a day.   PHYSICAL EXAMINATION: VITAL SIGNS: Temperature 97.7, pulse 81 to 96, respirations 18 to 20, blood pressure 226/101,  232/102,  200/105.  GENERAL APPEARANCE: Not in any acute distress but uncomfortable from low back pain and left lower extremity pain.  HEENT: Normocephalic, atraumatic. Pupils are equally reacting to light and accommodation. No scleral icterus, no conjunctival injection, no sinus tenderness, no postnasal drip; moist mucous membranes.  NECK: Supple. No JVD. No thyromegaly. Range of motion is intact.  LUNGS: Clear to auscultation bilaterally, no accessory muscle use and no anterior chest wall tenderness on palpation.  CARDIAC: S1, S2 normal, regular rate and rhythm, no murmur.  GASTROINTESTINAL: Soft, bowel sounds are positive in all 4 quadrants, obese, nontender, nondistended. No masses felt.  NEUROLOGIC: Awake, alert and oriented x 3, following verbal commands; reflexes are 2+ in left lower extremity, strength could not be examined as patient is in terrible pain, but reflexes are 2+, otherwise neurological examination is intact.   EXTREMITIES: Trace edema is present. No cyanosis. No clubbing.  SKIN: Warm to touch, normal turgor. No rashes. No lesions.  PSYCHIATRIC: Normal  mood and affect.   LABORATORY AND IMAGING STUDIES: CT angiogram of the abdomen and pelvis with and without contrast, normal caliber abdominal aorta. No aneurysm or dissection. Minor aortic and iliac artery atherosclerotic plaque, no significant stenosis, widely patent and branched of the aorta. No acute findings. Chronic findings include changes from the cholecystectomy, fatty infiltrates of the liver, subcentimeter low density renal lesions likely cysts and prostatic hypertrophy. Troponin less than 0.03, CK total 95, WBC 14.0, hemoglobin and hematocrit normal, platelet count 263,000, BMP is normal except his blood glucose at 219.  A 12-lead EKG is ordered which is pending at this time, not done yet.   ASSESSMENT AND PLAN: A 69 year old Caucasian male presenting to the ED with a chief complaint of severe back pain and left lower extremity pain, started from April 6, after cardiac catheterization and stent placement, went to get nerve plexus block today by the   pain management doctor, Dr. Sharlet Salina, but unfortunately patient's blood pressure being very high, he was sent over to the ED.  1.  Malignant hypertension. We will admit him to intensive care unit. The patient is started on Cardene drip. We will continue the same and titrate to a systolic blood pressure 194, diastolic to  90.  As patient's initial systolic blood pressure was at 230, I will aim for systolic blood pressure 174 to 180, cannot drop the blood pressure drastically as it will compromise cerebral perfusion. We will  resume his home medications and titrate on as needed basis. His elevated blood pressure is probably from severe low back pain. The patient is complaining of dull headache, we will go ahead and get CAT scan of the head STAT, to rule out any acute intracranial pathology.  2.  Severe low back pain with left lower extremity pain. Patient had MRI of the lumbar spine done as ordered by pain management doctor. This report is discussed with  the [facility] neurosurgeon, Dr. Kary Kos. He has no recommendations at this time and we will put a consult to pain management, Dr. Sharlet Salina, for nerve plexus block as recommended by him as an outpatient. Continue pain management with Dilaudid and morphine intravenous. We will up titrate the pain medications on as-needed basis. If patient is still with severe pain, if necessary, we might consider patient controlled analgesic to keep him comfortable.  3.  Coronary artery disease, status post stent last Wednesday on April 6 by Dr. Fletcher [name]. Initial troponin is negative, cycle cardiac biomarkers. CT angiogram of the chest ruled out aneurysm and dissection.  4.  Diabetes mellitus with diabetic nephropathy. Resume his home medication and monitor Accu-Cheks closely. We will check hemoglobin A1c.  5.  We will provide him gastrointestinal and deep vein thrombosis prophylaxis.  6.  CODE STATUS:  He is full code. Wife is the medical power of attorney.   Total critical care time spent is 50 minutes.    ____________________________ Nicholes Mango, MD ag:nt D: 03/04/2015 17:58:48 ET T: 03/04/2015 19:20:39 ET JOB#: [id number]  cc: Nicholes Mango, MD, <Dictator> Eduard Clos. Gilford Rile, MD Loletha Grayer Chasnis, DO  Nicholes Mango MD ELECTRONICALLY SIGNED 03/12/2015 17:50

## 2015-03-20 NOTE — Discharge Summary (Signed)
PATIENT NAME:  Steve Phillips, Steve Phillips MR#:  [id number] DATE OF BIRTH:  Mar 15, 1946  DATE OF ADMISSION:  03/04/2015 DATE OF DISCHARGE:  03/07/2015  PRESENTING COMPLAINTS:  Severe back pain with elevated high blood pressure.   DISCHARGE DIAGNOSES:  1. Malignant hypertension in the setting of significant back pain.  2. Lower back pain with radicular symptoms secondary to disk bulge.  3. Type 2 diabetes.  4. Coronary artery disease, status post recent stent placement.   CODE STATUS:  Full code.   DISCHARGE MEDICATIONS: 1. Metformin 1000 mg b.i.d.  2. Crestor 10 mg at bedtime.  3. Carvedilol 25 mg b.i.d.  4. Nitroglycerin 0.4 mg sublingual as needed.  5. Aspirin 81 mg t.i.d.  6. Tizanidine 4 mg 1 tablet 3 times a day.  7. Aspirin 81 mg daily.  8. Tizanidine 1 tablet 3 times a day as needed.  9. Protonix 40 mg b.i.d.  10.  Hydrochlorothiazide/lisinopril 12.5/20 one tablet b.i.d.  11.  Cyanocobalamin 1000 mcg injectable once a month.  12.  Carbidopa/levodopa 25/100 two tablets t.i.d.  13.  Duloxetine 60 mg 1 capsule daily.  14.  Lyrica 150 mg 2 capsules b.i.d.  15.  Hydrocodone/acetaminophen 5/325 two tablets every 8 hours as needed.  16.  NovoLog FlexPen 46-54 units t.i.d. according to your sliding scale.  17.  Plavix 75 mg daily.  18.  Toujeo 55 units subcutaneous b.i.d.  19.  Prednisone taper.  20.  Amlodipine 5 mg 2 tablets p.o. daily.   DISCHARGE SERVICES:  Home health PT and RN.   DISCHARGE INSTRUCTIONS:     1. Follow up with your pain clinic appointment on your schedule.  2. Follow up with Dr. [name].  3. Use LS corset.   4. Home health PT, nurse, and nurse aide.    LABORATORY DATA:  H and H are 13.8 and 43.3.  Creatinine is 1.33.    IMAGING STUDIES:  CT head essentially negative.   BRIEF SUMMARY OF HOSPITAL COURSE:  Steve Phillips is a pleasant 69 year old obese Caucasian gentleman with multiple medical problems, including hypertension, diabetes, coronary artery disease,  comes in with chief complaint of severe back pain with radicular symptoms to the left lower extremity.  He is status post cardiac catheterization and stent placement on 02/23/2015.  He was admitted with:  1. Severe low back pain with radicular symptoms to left lower extremity.  The patient had an MRI of the lumbar spine with multilevel disk protrusion.  He was started on p.o. Percocet p.r.n., morphine, Zanaflex.  LS corset was given while getting out of bed.  P.o. prednisone taper was also started.  Patient felt much better and tolerated physical therapy.  He will follow up with his outpatient pain clinic for possible epidural block.  2. Malignant hypertension in the setting of severe low back pain.  The patient's home medications were all resumed.  He did require IV nicardipine drip initially on admission.  3. Rapid response was called due to severe hypotension, resolved with IV fluid.  Blood pressure was very stable.  4. Type 2 diabetes with diabetic nephropathy, neuropathy.   Resumed his home medicines as before.  5. Coronary artery disease, status post stent placement on Wednesday, April 6, by Dr. Fletcher [name].  Continued his cardiac medications including Plavix and aspirin.   Hospital stay, otherwise remained stable.   The patient remained a full code.   TIME SPENT:  Forty minutes.      ____________________________ Hart Rochester Posey Pronto, MD sap:kc D: 03/09/2015  11:45:53 ET T: 03/09/2015 14:29:39 ET JOB#: [id number]  cc: Nivan Melendrez A. Posey Pronto, MD, <Dictator> Eduard Clos. Gilford Rile, MD Mertie Clause. Fletcher [name], MD  Ilda Basset MD ELECTRONICALLY SIGNED 03/18/2015 14:44

## 2015-03-20 NOTE — Assessment & Plan Note (Signed)
Exam showed no neuro/MSK deficits. Refilled zanaflex and asked pt to follow up with Neurosurgeon for back pain and pain doctor for nerve block.

## 2015-03-21 ENCOUNTER — Other Ambulatory Visit (INDEPENDENT_AMBULATORY_CARE_PROVIDER_SITE_OTHER): Payer: Medicare Other | Admitting: *Deleted

## 2015-03-21 ENCOUNTER — Telehealth: Payer: Self-pay | Admitting: Internal Medicine

## 2015-03-21 ENCOUNTER — Telehealth: Payer: Self-pay | Admitting: *Deleted

## 2015-03-21 DIAGNOSIS — I25110 Atherosclerotic heart disease of native coronary artery with unstable angina pectoris: Secondary | ICD-10-CM

## 2015-03-21 DIAGNOSIS — R0602 Shortness of breath: Secondary | ICD-10-CM

## 2015-03-21 NOTE — Telephone Encounter (Signed)
predniSONE (DELTASONE) 50 MG tablet

## 2015-03-21 NOTE — Telephone Encounter (Signed)
Spoke with pts wife, who states they were at Dr Tyrell Antonio office at the time.  I advised her to have pt request prednisone refill from Dr Fletcher [name].  She verbalized understanding

## 2015-03-21 NOTE — Telephone Encounter (Signed)
I just gave him a one time refill because he was running out. It was not originally prescribed by me. He should contact whoever prescribed it the first time.  Staying on steroid long term is not advised.

## 2015-03-21 NOTE — Telephone Encounter (Signed)
He will need to contact Dr. Fletcher [name] for follow up.

## 2015-03-21 NOTE — Telephone Encounter (Signed)
Review of pts chart, Dr Fletcher [name] filled this medication on 4.25.16.  Please advise refill

## 2015-03-21 NOTE — Telephone Encounter (Signed)
Spoke with pts wife she states Dr Fletcher [name] (Cardiologist) prescribed it on 4.25.16 for back pain.

## 2015-03-21 NOTE — Telephone Encounter (Signed)
This is a high dose of Prednisone. Who initially prescribed this and what was this for?

## 2015-03-21 NOTE — Telephone Encounter (Signed)
Pt requesting refill on Prednisone 50 mg.  Pt has no prednisone left after today. Pt contacted PCP and they would not refill due to them not being the one who prescribed. Please advise.

## 2015-03-22 ENCOUNTER — Telehealth: Payer: Self-pay

## 2015-03-22 DIAGNOSIS — M79605 Pain in left leg: Secondary | ICD-10-CM | POA: Diagnosis not present

## 2015-03-22 DIAGNOSIS — R296 Repeated falls: Secondary | ICD-10-CM | POA: Diagnosis not present

## 2015-03-22 DIAGNOSIS — G629 Polyneuropathy, unspecified: Secondary | ICD-10-CM | POA: Diagnosis not present

## 2015-03-22 DIAGNOSIS — I2510 Atherosclerotic heart disease of native coronary artery without angina pectoris: Secondary | ICD-10-CM | POA: Diagnosis not present

## 2015-03-22 DIAGNOSIS — E119 Type 2 diabetes mellitus without complications: Secondary | ICD-10-CM | POA: Diagnosis not present

## 2015-03-22 DIAGNOSIS — I10 Essential (primary) hypertension: Secondary | ICD-10-CM | POA: Diagnosis not present

## 2015-03-22 LAB — BASIC METABOLIC PANEL
BUN / CREAT RATIO: 23 — AB (ref 10–22)
BUN: 23 mg/dL (ref 8–27)
CALCIUM: 8.6 mg/dL (ref 8.6–10.2)
CO2: 20 mmol/L (ref 18–29)
CREATININE: 1 mg/dL (ref 0.76–1.27)
Chloride: 97 mmol/L (ref 97–108)
GFR, EST AFRICAN AMERICAN: 88 mL/min/{1.73_m2} (ref >59)
GFR, EST NON AFRICAN AMERICAN: 76 mL/min/{1.73_m2} (ref >59)
GLUCOSE: 334 mg/dL — AB (ref 65–99)
POTASSIUM: 4.7 mmol/L (ref 3.5–5.2)
SODIUM: 139 mmol/L (ref 134–144)

## 2015-03-22 NOTE — Telephone Encounter (Signed)
Continue to monitor for now. We added spironolactone last week.

## 2015-03-22 NOTE — Telephone Encounter (Signed)
Pt has been notified and is aware that he should contact original provider.

## 2015-03-22 NOTE — Telephone Encounter (Signed)
Will forward to Dr Arida for review  

## 2015-03-22 NOTE — Telephone Encounter (Signed)
Pt nurse with [facility], states pt was started on new BP last week, BP today is 160/90, states this is his readings each time she checks it. Also asks if he should still be taking Hydrolazine. Please call and advise.

## 2015-03-23 DIAGNOSIS — M5136 Other intervertebral disc degeneration, lumbar region: Secondary | ICD-10-CM | POA: Diagnosis not present

## 2015-03-23 DIAGNOSIS — M5416 Radiculopathy, lumbar region: Secondary | ICD-10-CM | POA: Diagnosis not present

## 2015-03-23 NOTE — Telephone Encounter (Signed)
Continue to monitor BP for another week. If BP remains high, we can add Hydralazine.

## 2015-03-23 NOTE — Telephone Encounter (Signed)
S/w HHRN. Notified to continue monitoring BP x 1 week and to notify office if BP continues to be elevated. RN verbalized understanding and indicated she had no other questions.

## 2015-03-23 NOTE — Telephone Encounter (Signed)
S/w wife regarding lab results and BP/hydralazine, Per Dr. Fletcher [name], continue to monitor BP for the next week; do not take hydralazine at this time. Wife indicated she understood and had no further questions.

## 2015-03-23 NOTE — Telephone Encounter (Signed)
LM for [facility] per instructions per Dr. Fletcher [name]. Left CB number if any questions.

## 2015-03-23 NOTE — Telephone Encounter (Signed)
S/w called back. Aware of spironalactone instructions. Wife indicated hydralazine prescription recieved through mail order pharm. Med not on pt. list. Advised not to take hydralizine until nurse speaks with Arida. Pt concerned pd for med not taking

## 2015-03-24 ENCOUNTER — Ambulatory Visit (INDEPENDENT_AMBULATORY_CARE_PROVIDER_SITE_OTHER): Payer: Medicare Other | Admitting: *Deleted

## 2015-03-24 DIAGNOSIS — E538 Deficiency of other specified B group vitamins: Secondary | ICD-10-CM

## 2015-03-24 MED ORDER — CYANOCOBALAMIN 1000 MCG/ML IJ SOLN
1000.0000 ug | Freq: Once | INTRAMUSCULAR | Status: AC
  Administered 2015-03-24: 1000 ug via INTRAMUSCULAR

## 2015-03-28 ENCOUNTER — Other Ambulatory Visit: Payer: Self-pay

## 2015-03-28 DIAGNOSIS — I10 Essential (primary) hypertension: Secondary | ICD-10-CM | POA: Diagnosis not present

## 2015-03-28 DIAGNOSIS — G629 Polyneuropathy, unspecified: Secondary | ICD-10-CM | POA: Diagnosis not present

## 2015-03-28 DIAGNOSIS — R296 Repeated falls: Secondary | ICD-10-CM | POA: Diagnosis not present

## 2015-03-28 DIAGNOSIS — E119 Type 2 diabetes mellitus without complications: Secondary | ICD-10-CM | POA: Diagnosis not present

## 2015-03-28 DIAGNOSIS — Z9861 Coronary angioplasty status: Principal | ICD-10-CM

## 2015-03-28 DIAGNOSIS — I2510 Atherosclerotic heart disease of native coronary artery without angina pectoris: Secondary | ICD-10-CM | POA: Diagnosis not present

## 2015-03-28 DIAGNOSIS — M79605 Pain in left leg: Secondary | ICD-10-CM | POA: Diagnosis not present

## 2015-03-28 DIAGNOSIS — Z955 Presence of coronary angioplasty implant and graft: Secondary | ICD-10-CM

## 2015-03-30 NOTE — Progress Notes (Unsigned)
   Subjective:    Patient ID: Steve Phillips, male    DOB: 12-Apr-1946, 69 y.o.   MRN: [id number]  HPI    Review of Systems     Objective:   Physical Exam        Assessment & Plan:

## 2015-03-31 ENCOUNTER — Other Ambulatory Visit: Payer: Self-pay | Admitting: Internal Medicine

## 2015-03-31 ENCOUNTER — Ambulatory Visit: Payer: Medicare Other

## 2015-04-04 ENCOUNTER — Telehealth: Payer: Self-pay | Admitting: Internal Medicine

## 2015-04-04 ENCOUNTER — Ambulatory Visit (INDEPENDENT_AMBULATORY_CARE_PROVIDER_SITE_OTHER): Payer: Medicare Other | Admitting: Internal Medicine

## 2015-04-04 ENCOUNTER — Other Ambulatory Visit: Payer: Self-pay | Admitting: *Deleted

## 2015-04-04 ENCOUNTER — Encounter: Payer: Self-pay | Admitting: Internal Medicine

## 2015-04-04 VITALS — BP 113/69 | HR 57 | Temp 97.7°F | Ht 73.0 in | Wt 258.2 lb

## 2015-04-04 DIAGNOSIS — E1140 Type 2 diabetes mellitus with diabetic neuropathy, unspecified: Secondary | ICD-10-CM | POA: Diagnosis not present

## 2015-04-04 DIAGNOSIS — M5416 Radiculopathy, lumbar region: Secondary | ICD-10-CM

## 2015-04-04 DIAGNOSIS — I2510 Atherosclerotic heart disease of native coronary artery without angina pectoris: Secondary | ICD-10-CM

## 2015-04-04 DIAGNOSIS — I10 Essential (primary) hypertension: Secondary | ICD-10-CM | POA: Diagnosis not present

## 2015-04-04 DIAGNOSIS — Z9861 Coronary angioplasty status: Secondary | ICD-10-CM

## 2015-04-04 DIAGNOSIS — Z6834 Body mass index (BMI) 34.0-34.9, adult: Secondary | ICD-10-CM | POA: Diagnosis not present

## 2015-04-04 DIAGNOSIS — I25110 Atherosclerotic heart disease of native coronary artery with unstable angina pectoris: Secondary | ICD-10-CM

## 2015-04-04 DIAGNOSIS — E1149 Type 2 diabetes mellitus with other diabetic neurological complication: Secondary | ICD-10-CM

## 2015-04-04 MED ORDER — PREDNISONE 20 MG PO TABS: 20.0000 mg | ORAL_TABLET | Freq: Every day | ORAL | Status: DC

## 2015-04-04 MED ORDER — PREDNISONE 10 MG PO TABS: 10.0000 mg | ORAL_TABLET | Freq: Every day | ORAL | Status: DC

## 2015-04-04 MED ORDER — INSULIN LISPRO 100 UNIT/ML ~~LOC~~ SOLN: 50.0000 [IU] | Freq: Three times a day (TID) | SUBCUTANEOUS | Status: DC

## 2015-04-04 MED ORDER — INSULIN ASPART 100 UNIT/ML ~~LOC~~ SOLN: SUBCUTANEOUS | Status: DC

## 2015-04-04 NOTE — Assessment & Plan Note (Signed)
Reviewed notes from Dr. Fletcher [name] re stent placement. Plan for 1 year dual anti-platelet therapy with aspirin and plavix.

## 2015-04-04 NOTE — Assessment & Plan Note (Signed)
BG continues to be elevated. Agree with increase in Toujeo to 70units bid. Discussed that BG will continue to be elevated on Prednisone. Follow up with Dr. Cruzita Lederer tomorrow.

## 2015-04-04 NOTE — Telephone Encounter (Signed)
Pt has appt tomorrow would like a call back if possible before 12 today the pts levels have been off and he will be out of his meds today so he does need refills.

## 2015-04-04 NOTE — Assessment & Plan Note (Addendum)
BP Readings from Last 3 Encounters:  04/04/15 113/69  03/14/15 168/82  03/11/15 158/70   BP well controlled. Continue current medications.

## 2015-04-04 NOTE — Assessment & Plan Note (Signed)
Reviewed MRI showing L5 nerve root compression. Follow up as scheduled with Dr. Hal Phillips and with Dr. Andree Phillips next week. Will continue Prednisone, however we discussed the risks and limitations of this medication. Will continue 20mg  daily for now, with plan to taper next week after eval with Dr. Andree Phillips. Question if he might benefit from [place] if he will not be a surgical candidate while on Plavix.

## 2015-04-04 NOTE — Patient Instructions (Addendum)
Continue Prednisone 20mg  daily to help control severe leg pain.  Follow up with pain management as scheduled May 23rd.  Follow up here in 8 weeks.

## 2015-04-04 NOTE — Telephone Encounter (Signed)
Spoke with pt's wife. Pt's blood sugar has been up and down. Pt has been in a lot of pain and in/out of the hospital. Pt to see Dr Cruzita Lederer tomorrow to discuss further medication adjustments with the Novolog and Toujeo.

## 2015-04-04 NOTE — Progress Notes (Signed)
Pre visit review using our clinic review tool, if applicable. No additional management support is needed unless otherwise documented below in the visit note. 

## 2015-04-04 NOTE — Progress Notes (Signed)
Subjective:    Patient ID: Steve Phillips, male    DOB: 1945/12/01, 69 y.o.   MRN: [id number]  HPI  69YO male presents for follow up.  Recently had cardiac cath and drug eluting stent placement, mid LAD. After procedure, had severe pain in left leg. MRI showed a bulging disc L4-5 with L5 nerve root compressoin. Taking Prednisone taper with slight improvement. Also using Norco, however this medication does not help. Unable to have surgical intervention because of Plavix. Went to ED 2x for pain and given Morphine and Dilaudid, with minimal improvement. Pain radiates from lower back down lateral left leg. Stops about at knee. Pain is rated 8/10 at present. Losing feeling in left leg. Has to use a walker. No recent falls. Had ESI x 2 with no improvement. Has appointment scheduled with pain management next week.   DM - BG have been running near 200s. Highest 400. Lowest 180. These BG are while on Prednisone. Compliant with insulin. Has increased to 70units twice daily.  Past medical, surgical, family and social history per today's encounter.  Review of Systems  Constitutional: Positive for fatigue. Negative for fever, chills, activity change, appetite change and unexpected weight change.  Eyes: Negative for visual disturbance.  Respiratory: Negative for cough and shortness of breath.   Cardiovascular: Negative for chest pain, palpitations and leg swelling.  Gastrointestinal: Negative for nausea, vomiting, abdominal pain, diarrhea, constipation and abdominal distention.  Genitourinary: Negative for dysuria, urgency and difficulty urinating.  Musculoskeletal: Positive for myalgias and arthralgias. Negative for gait problem.  Skin: Negative for color change and rash.  Neurological: Positive for weakness and numbness.  Hematological: Negative for adenopathy.  Psychiatric/Behavioral: Positive for dysphoric mood. Negative for sleep disturbance. The patient is not nervous/anxious.          Objective:    BP 113/69 mmHg  Pulse 57  Temp(Src) 97.7 F (36.5 C) (Oral)  Ht 6\' 1"  (1.854 m)  Wt 258 lb 4 oz (117.141 kg)  BMI 34.08 kg/m2  SpO2 97% Physical Exam  Constitutional: He is oriented to person, place, and time. He appears well-developed and well-nourished. No distress.  HENT:  Head: Normocephalic and atraumatic.  Right Ear: External ear normal.  Left Ear: External ear normal.  Nose: Nose normal.  Mouth/Throat: Oropharynx is clear and moist. No oropharyngeal exudate.  Eyes: Conjunctivae and EOM are normal. Pupils are equal, round, and reactive to light. Right eye exhibits no discharge. Left eye exhibits no discharge. No scleral icterus.  Neck: Normal range of motion. Neck supple. No tracheal deviation present. No thyromegaly present.  Cardiovascular: Normal rate, regular rhythm and normal heart sounds.  Exam reveals no gallop and no friction rub.   No murmur heard. Pulmonary/Chest: Effort normal and breath sounds normal. No accessory muscle usage. No tachypnea. No respiratory distress. He has no decreased breath sounds. He has no wheezes. He has no rhonchi. He has no rales. He exhibits no tenderness.  Musculoskeletal: He exhibits no edema.       Lumbar back: He exhibits decreased range of motion, tenderness and pain.  Lymphadenopathy:    He has no cervical adenopathy.  Neurological: He is alert and oriented to person, place, and time. No cranial nerve deficit. Coordination normal.  Skin: Skin is warm and dry. No rash noted. He is not diaphoretic. No erythema. No pallor.  Psychiatric: His speech is normal and behavior is normal. Judgment and thought content normal. His mood appears anxious. He exhibits a depressed mood. He expresses  no suicidal ideation.          Assessment & Plan:   Problem List Items Addressed This Visit      High   CAD (coronary artery disease) (Chronic)    Reviewed notes from Dr. Fletcher [name] re stent placement. Plan for 1 year dual anti-platelet  therapy with aspirin and plavix.      DM (diabetes mellitus), type 2 with neurological complications (Chronic)    BG continues to be elevated. Agree with increase in Toujeo to 70units bid. Discussed that BG will continue to be elevated on Prednisone. Follow up with Dr. Cruzita Lederer tomorrow.      Hypertension (Chronic)    BP Readings from Last 3 Encounters:  04/04/15 113/69  03/14/15 168/82  03/11/15 158/70   BP well controlled. Continue current medications.        Unprioritized   Lumbar radicular pain - Primary    Reviewed MRI showing L5 nerve root compression. Follow up as scheduled with Dr. Hal Neer and with Dr. Andree Elk next week. Will continue Prednisone, however we discussed the risks and limitations of this medication. Will continue 20mg  daily for now, with plan to taper next week after eval with Dr. Andree Elk. Question if he might benefit from [place] if he will not be a surgical candidate while on Plavix.          Return in about 4 weeks (around 05/02/2015) for Recheck.

## 2015-04-05 ENCOUNTER — Other Ambulatory Visit: Payer: Self-pay | Admitting: *Deleted

## 2015-04-05 ENCOUNTER — Ambulatory Visit (INDEPENDENT_AMBULATORY_CARE_PROVIDER_SITE_OTHER): Payer: Medicare Other | Admitting: Internal Medicine

## 2015-04-05 ENCOUNTER — Encounter: Payer: Self-pay | Admitting: Internal Medicine

## 2015-04-05 VITALS — BP 138/82 | HR 83 | Temp 97.6°F | Resp 12 | Wt 255.0 lb

## 2015-04-05 DIAGNOSIS — E1140 Type 2 diabetes mellitus with diabetic neuropathy, unspecified: Secondary | ICD-10-CM | POA: Diagnosis not present

## 2015-04-05 DIAGNOSIS — I2510 Atherosclerotic heart disease of native coronary artery without angina pectoris: Secondary | ICD-10-CM

## 2015-04-05 DIAGNOSIS — Z9861 Coronary angioplasty status: Secondary | ICD-10-CM

## 2015-04-05 DIAGNOSIS — E1149 Type 2 diabetes mellitus with other diabetic neurological complication: Secondary | ICD-10-CM

## 2015-04-05 MED ORDER — INSULIN REGULAR HUMAN (CONC) 500 UNIT/ML ~~LOC~~ SOLN: SUBCUTANEOUS | Status: DC

## 2015-04-05 NOTE — Progress Notes (Signed)
Patient ID: Steve Phillips, male   DOB: 10-15-1946, 69 y.o.   MRN: [id number]  HPI: Steve Phillips is a 69 y.o.-year-old male, returning for f/u for DM2, dx 1990's, insulin-dependent, uncontrolled, with complications (CAD, PN, CKD). Last visit 1.5 mo ago. He is here with his wife who offers part of the hx.  6 weeks ago >> cardiac cath >> after this, L leg hurting >> many steroid courses and 2 spine injections. He may need a myelogram (but this is problematic since he is on Plavix).  He was admitted for HTN 1 mo ago [facility]).  He is on 20 mg Prednisone (switched from 25 yesterday).   Last hemoglobin A1c was: Lab Results  Component Value Date   HGBA1C 9.1* 02/16/2015   HGBA1C 9.7* 11/16/2014   HGBA1C 9.9* 08/18/2014  Was on Prednisone, then had a hip and a shoulder Kenalog injection.  We tried U500 insulin: 10 units before a small meal 15 units before a regular meal 20 units before a large meal or if you have dessert For smaller meal, injects only 0.10 mL (10 units on the syringe) or even 0.05 mL (5 units on the syringe)  He did not like the U500 >> nausea, now taking: - Toujeo 70 units 2x a day  - NovoLog: 50-55 units with a meal - NovoLog Sliding Scale:  - 150- 165: + 1 unit  - 166- 180: + 2 units  - 181- 195: + 3 units  - 196- 210: + 4 units  - 211- 225: + 5 units  - > 225: + 6 units   Pt checks his sugars 2-3x a day and they are - brings log - much higher on steroids: - am: 90-150 >> 127-217 >> 98-150s, but some 200-276 ~ eating at night >> 102-242 >> 69-145, 186 >> 112-313 - 2h after b'fast: 220-312 >> 191-301 >> forgot >> 216-289 >> n/c >> 260-318 >> 119, 183-250 >> 222-298, 416 - before lunch: 200-231 >> 211-227 >> forgot >> 237 >> 165-231 >> 204-244 >> n/c >> 165 - 2h after lunch: n/c  >> 262 >> 302-403 >> 271, 287 >> forgot >> 313 >> 126 >> 251, 356 >> 49x1 (took insulin w/o eating), 73-248 >> n/c - before dinner: 161 >> forgot >> 121, 216-246 >> 58 -185 >> 154 >> 119, 238 >>  214 - 2h after dinner: 197-337 >> forgot >> 199, 235, 284 >> 145-158, 299x1 >> 201-330 >> 109-273 >> 226-361, 499 - bedtime: 256-473 >> 200-301 >> forgot >> 256, 307 >> 54x1, 81-119 >> 167-257 >> 104-298 >>> 145-278 + lows - lowest 53; he has hypoglycemia awareness at 70.  Highest sugar was 297 >> 499.  Pt's meals are - but he eats constantly! - Breakfast: 2 eggs, sausage, toast, coffee - Lunch: sandwich or leftovers - Dinner: meat + veggies + bread, tea or milk - Snacks: pm and late at night  - + mild CKD, last BUN/creatinine:  Lab Results  Component Value Date   BUN 23 03/21/2015   CREATININE 1.00 03/21/2015  On Lisinopril.  - last set of lipids: Lab Results  Component Value Date   CHOL 189 11/16/2014   HDL 38.90* 11/16/2014   LDLCALC 25 05/14/2014   LDLDIRECT 116.0 11/16/2014   TRIG 306.0* 11/16/2014   CHOLHDL 5 11/16/2014  On Crestor. - last eye exam was 01/28/2014 ([name]). No DR.  - + no sensation in legs below knee. He sees his podiatrist q 3 mo (  [name]). Foot exam performed 04/2014.  I reviewed pt's medications, allergies, PMH, social hx, family hx, and changes were documented in the history of present illness. Otherwise, unchanged from my initial visit note.  He had a stroke and Bells' palsy in 05/2014 >> in the hospital >> started on steroids 50 mg x 4 days, then tapering >> sugars higher after this.   ROS: Constitutional: no weight gain/loss, no increased appetite, no fatigue, no subjective hypo/hyperthermia Eyes:no blurry vision, no xerophthalmia ENT: no sore throat, no nodules palpated in throat, no dysphagia/odynophagia, no hoarseness, + hypoacusis Cardiovascular: no CP/no SOB/palpitations/leg swelling Respiratory: no cough/SOB Gastrointestinal: no N/V/D/C Musculoskeletal: + muscle aches/+ joint aches/+ back pain; + left leg pain -severe  Skin: no rashes Neurological: no tremors/+ numbness - up to knees/tingling/dizziness  PE: BP 138/82  mmHg  Pulse 83  Temp(Src) 97.6 F (36.4 C) (Oral)  Resp 12  Wt 255 lb (115.667 kg)  SpO2 96% Wt Readings from Last 3 Encounters:  04/05/15 255 lb (115.667 kg)  04/04/15 258 lb 4 oz (117.141 kg)  03/14/15 255 lb 8 oz (115.894 kg)   Constitutional: overweight, in NAD, walks with walker Eyes: PERRLA, EOMI, no exophthalmos ENT: moist mucous membranes, no thyromegaly, no cervical lymphadenopathy Cardiovascular: RRR, No MRG, + leg swelling bilaterally - pitting Respiratory: CTA B Gastrointestinal: abdomen soft, NT, ND, BS+ Musculoskeletal: no deformities Skin: moist, warm, no rashes  ASSESSMENT: 1. DM2, insulin-dependent, uncontrolled, with complications - CAD, s/p multiple PCI with stent RCA,LAD and obtuse marginal, followed @ [facility] on the accord study.., cath 02/2012 90% D1, s/p drug-eluting stent - Dr. Fletcher [name] - Peripheral neuropathy - CKD, mild  - tried U500 >> nausea He was also in the Accord Study for 8 years.   PLAN:  1. Patient with long-standing, uncontrolled, diabetes, now back on basal- bolus regimen since he had nausea with U500 insulin >> sugars very high in the setting of steroid use. I suggested that he tries again U500 insulin since he is requiring 290 units of insulin per day as of now, with still poor control. He agrees to retry. We we'll try the KwikPen's and I gave him 2 discount cards. I advised him to check sugars frequently after he starts the concentrated insulin tomorrow morning and also check once tomorrow night. I showed him the pen and demonstrated how it works. He and his wife voiced understanding. They have been needles at home. If we cannot use the pens, we can always go to vials. - I suggested to:  Patient Instructions  Please stop Toujeo and Novolog for now and start: U500 insulin 90 units before each meal.  Please call me with your sugars on Friday.  Please schedule an appt with [name] in June to see how you are doing with the U500  regimen.  Please come back for a follow-up appointment in 2 months.   - he will continue Metformin  - given new sugar logs - continue checking sugars at different times of the day - check 3 times a day, rotating checks - up to date with eye exams  - RTC in 2 mo ([place] will be out of the country [place] 3 [place]), but advised him to call me with his sugars at the end of this week and then schedule an appointment with the diabetes educator in 3 weeks.

## 2015-04-05 NOTE — Patient Instructions (Signed)
Please stop Toujeo and Novolog for now and start: U500 insulin 90 units before each meal.  Please call me with your sugars on Friday.  Please schedule an appt with [name] in June to see how you are doing with the U500 regimen.  Please come back for a follow-up appointment in 2 months.

## 2015-04-06 ENCOUNTER — Telehealth: Payer: Self-pay | Admitting: *Deleted

## 2015-04-06 ENCOUNTER — Telehealth: Payer: Self-pay | Admitting: Internal Medicine

## 2015-04-06 DIAGNOSIS — I2510 Atherosclerotic heart disease of native coronary artery without angina pectoris: Secondary | ICD-10-CM | POA: Diagnosis not present

## 2015-04-06 DIAGNOSIS — E119 Type 2 diabetes mellitus without complications: Secondary | ICD-10-CM | POA: Diagnosis not present

## 2015-04-06 DIAGNOSIS — I10 Essential (primary) hypertension: Secondary | ICD-10-CM | POA: Diagnosis not present

## 2015-04-06 DIAGNOSIS — M79605 Pain in left leg: Secondary | ICD-10-CM | POA: Diagnosis not present

## 2015-04-06 DIAGNOSIS — R296 Repeated falls: Secondary | ICD-10-CM | POA: Diagnosis not present

## 2015-04-06 DIAGNOSIS — G629 Polyneuropathy, unspecified: Secondary | ICD-10-CM | POA: Diagnosis not present

## 2015-04-06 NOTE — Telephone Encounter (Signed)
Patient wife has question about insulin prescribed to patient, please advise

## 2015-04-06 NOTE — Telephone Encounter (Signed)
Needs cardiac clearance. Please advise.

## 2015-04-06 NOTE — Telephone Encounter (Signed)
Request for surgical clearance:  1. What type of surgery is being performed? milegram 2.  3. When is this surgery scheduled? Not yet   4. Are there any medications that need to be held prior to surgery and how long? plavix for 5 day  5. Name of physician performing surgery? Not sure  6. What is your office phone and fax number? Fax: [phone] attn: Quillian Quince

## 2015-04-06 NOTE — Telephone Encounter (Signed)
Yes, let's decrease the dose to 60 units 3x a day. Please call again on Friday with sugars. It is great that the U500 is working so well for him!

## 2015-04-06 NOTE — Telephone Encounter (Signed)
Called pt's wife and advised her per Dr Arman Filter message below. She voiced understanding.

## 2015-04-06 NOTE — Telephone Encounter (Signed)
Returned call. Message sent to Dr Cruzita Lederer.

## 2015-04-06 NOTE — Telephone Encounter (Signed)
Pt's wife called. She stated that pt's fasting blood sugar this AM was 223. He did the 90 units of Humulin R. He ate and 2 hrs later his blood sugar was 75 (pt did not feel well). Pt's blood sugar was 134 after lunch (he did not take the 90 units of insulin). At 4 pm his blood sugar was 68 and at 4:25 it was 74. Please advise if pt needs to change his insulin dosage per pt's wife.

## 2015-04-08 NOTE — Telephone Encounter (Signed)
He is at moderate risk from a cardiac standpoint. No need for stress test. Unfortunately though, it's not safe to stop Plavix or aspirin at the present time given that he had a drug coated stent in April. The earliest to hold Plavix would be October but if the surgery is urgent and cannot wait until then, Plavix can be held but he will be at increased risk for stent thrombosis and myocardial infarction.

## 2015-04-08 NOTE — Telephone Encounter (Signed)
Faxed to [place] [phone]

## 2015-04-11 ENCOUNTER — Ambulatory Visit: Payer: Medicare Other | Admitting: Anesthesiology

## 2015-04-12 ENCOUNTER — Telehealth: Payer: Self-pay | Admitting: *Deleted

## 2015-04-12 NOTE — Telephone Encounter (Signed)
Called pt's wife and advised her per Dr Arman Filter message below. She voiced understanding.

## 2015-04-12 NOTE — Telephone Encounter (Signed)
OK, we need even less U500:   30 units before a larger meal  20 units before a regular meal Please call back on Friday or before then if persisting lows.

## 2015-04-12 NOTE — Telephone Encounter (Signed)
Pt's wife called and gave pt's blood sugar.  Thurs 5/19 6 am  39 (90 units of U500) 6:30 am 82 (pt drank some juice) 8:40 am 48 (pt had eaten and taken metformin) 10 am  198 2:55 pm 267 (pt took 40 units of U500) 7:30 pm 193 (metformin at bedtime)  Fri 5/20 5 am  192 (0 insulin) 9 am  202 1:30 am 159 5:45 am 150 8 pm  252 (30 units)  Sat 5/21 9 am  115 10 am  Breakfast and 40 units of insulin 6 pm  45 units of insulin  Sun 5/22 8:30 am 55 (0 insulin) 8:45 am Breakfast and metformin 2:30 pm 217 (lunch and 25 units) 9 pm  207 (2 hours after dinner)  Mon 5/23 5:50 am 48 (drank OJ) 6:50 am 133 (ate breakfast and metformin) 9:10 am Before breakfast 158   After was 84 7:30 pm 348 (was 2 hrs after dinner) took 40 units of insulin  Tues 5/24 5:30 am 131 (0 insulin)

## 2015-04-14 DIAGNOSIS — M5416 Radiculopathy, lumbar region: Secondary | ICD-10-CM | POA: Diagnosis not present

## 2015-04-14 DIAGNOSIS — M5136 Other intervertebral disc degeneration, lumbar region: Secondary | ICD-10-CM | POA: Diagnosis not present

## 2015-04-15 ENCOUNTER — Telehealth: Payer: Self-pay | Admitting: Internal Medicine

## 2015-04-15 DIAGNOSIS — G629 Polyneuropathy, unspecified: Secondary | ICD-10-CM | POA: Diagnosis not present

## 2015-04-15 DIAGNOSIS — I2510 Atherosclerotic heart disease of native coronary artery without angina pectoris: Secondary | ICD-10-CM | POA: Diagnosis not present

## 2015-04-15 DIAGNOSIS — E119 Type 2 diabetes mellitus without complications: Secondary | ICD-10-CM | POA: Diagnosis not present

## 2015-04-15 DIAGNOSIS — M79605 Pain in left leg: Secondary | ICD-10-CM | POA: Diagnosis not present

## 2015-04-15 DIAGNOSIS — R296 Repeated falls: Secondary | ICD-10-CM | POA: Diagnosis not present

## 2015-04-15 DIAGNOSIS — I10 Essential (primary) hypertension: Secondary | ICD-10-CM | POA: Diagnosis not present

## 2015-04-15 NOTE — Telephone Encounter (Signed)
Steve Phillips from [place] stated that Steve Phillips fell on Tuesday.  He has bruising on his arm and an abrasion on his leg.  He denies pain from the fall.

## 2015-04-15 NOTE — Telephone Encounter (Signed)
Home health nurse, isn't requesting anything at this time, just wanted documentation of fall on record.

## 2015-04-19 ENCOUNTER — Other Ambulatory Visit: Payer: Self-pay | Admitting: *Deleted

## 2015-04-19 MED ORDER — PREGABALIN 150 MG PO CAPS: 150.0000 mg | ORAL_CAPSULE | Freq: Two times a day (BID) | ORAL | Status: DC

## 2015-04-19 NOTE — Telephone Encounter (Signed)
Ok refill? 

## 2015-04-21 DIAGNOSIS — M5136 Other intervertebral disc degeneration, lumbar region: Secondary | ICD-10-CM | POA: Diagnosis not present

## 2015-04-21 DIAGNOSIS — M5416 Radiculopathy, lumbar region: Secondary | ICD-10-CM | POA: Diagnosis not present

## 2015-04-22 DIAGNOSIS — I2510 Atherosclerotic heart disease of native coronary artery without angina pectoris: Secondary | ICD-10-CM | POA: Diagnosis not present

## 2015-04-22 DIAGNOSIS — G629 Polyneuropathy, unspecified: Secondary | ICD-10-CM | POA: Diagnosis not present

## 2015-04-22 DIAGNOSIS — I10 Essential (primary) hypertension: Secondary | ICD-10-CM | POA: Diagnosis not present

## 2015-04-22 DIAGNOSIS — R296 Repeated falls: Secondary | ICD-10-CM | POA: Diagnosis not present

## 2015-04-22 DIAGNOSIS — M79605 Pain in left leg: Secondary | ICD-10-CM | POA: Diagnosis not present

## 2015-04-22 DIAGNOSIS — E119 Type 2 diabetes mellitus without complications: Secondary | ICD-10-CM | POA: Diagnosis not present

## 2015-04-25 ENCOUNTER — Telehealth: Payer: Self-pay

## 2015-04-25 ENCOUNTER — Other Ambulatory Visit: Payer: Self-pay

## 2015-04-25 DIAGNOSIS — I10 Essential (primary) hypertension: Secondary | ICD-10-CM

## 2015-04-25 MED ORDER — FUROSEMIDE 20 MG PO TABS: 20.0000 mg | ORAL_TABLET | Freq: Every day | ORAL | Status: DC

## 2015-04-25 NOTE — Telephone Encounter (Signed)
Give Lasix 20 mg once daily for 2 days. Check basic metabolic profile in 2 days. Monitor blood pressure at home. Low-sodium diet.

## 2015-04-25 NOTE — Telephone Encounter (Signed)
S/w patient wife  Steve Phillips who verbalized understanding of recommendations. Will give pt lasix 20mg  once a day for two days. Scheduled labs for Wed., June 8 Will get batteries for BP cuff and monitor BP. Discussed low sodium diet with pt wife.

## 2015-04-25 NOTE — Telephone Encounter (Signed)
S/w wife Horris Latino who states pt went out to dinner last night and thinks he may have overdone it. Stopped at [facility] after dinner who took BP and did EKG. Suggested pt get in touch with cardiologist. Indicates swelling in lower extremities and face. Denies change in diet, states on low sodium diet. States medication compliance. Back pain. Recent steroid injection, fluctuation in blood sugar.  States he has left leg pain. Does not have batteries for home BP cuff therefore not sure of BP today but states he does not feel well.   Recommended pt gets batteries for BP cuff and report BP to office. Will forward to [facility] for recommendations regarding swelling.

## 2015-04-25 NOTE — Telephone Encounter (Signed)
Pt wife called, states pt thinks he has has indigestion, states he has fluid build up in his legs and feet. No SOB, BP last night 194/80. hasn't taken it this morning. Please call.

## 2015-04-27 ENCOUNTER — Encounter: Payer: Medicare Other | Attending: Internal Medicine | Admitting: Nutrition

## 2015-04-27 ENCOUNTER — Other Ambulatory Visit (INDEPENDENT_AMBULATORY_CARE_PROVIDER_SITE_OTHER): Payer: Medicare Other | Admitting: *Deleted

## 2015-04-27 ENCOUNTER — Telehealth: Payer: Self-pay

## 2015-04-27 DIAGNOSIS — Z029 Encounter for administrative examinations, unspecified: Secondary | ICD-10-CM | POA: Diagnosis present

## 2015-04-27 DIAGNOSIS — I10 Essential (primary) hypertension: Secondary | ICD-10-CM | POA: Diagnosis not present

## 2015-04-27 DIAGNOSIS — E1149 Type 2 diabetes mellitus with other diabetic neurological complication: Secondary | ICD-10-CM

## 2015-04-27 NOTE — Telephone Encounter (Signed)
S/w patient's wife Horris Latino who states patient has taken lasix x 2 days. States right leg swelling slightly improved; left leg shows no improvement States patient is SOB with exertion, more so than normal. Recent BP: 6/7  11:00am     204/93 6/7  10:15pm   196/91 6/8 7:10am        163/78 Patient had labs today. Awaiting results. Advised wife to take BP this evening and tomorrow morning then call with results. Pt verbalized understanding with no further questions.

## 2015-04-27 NOTE — Progress Notes (Deleted)
Subjective:     Patient ID: Steve Phillips, male   DOB: 14-Sep-1946, 69 y.o.   MRN: [id number]  HPI   Review of Systems     Objective:   Physical Exam     Assessment:     ***    Plan:     ***

## 2015-04-27 NOTE — Progress Notes (Signed)
Mr. [place] was here with his wife to review his diet and insulin doses. His meals are balanced, but high in fat.   He is taking U500 R insulin:  20u ac meals, except if larger, will take 30u FBS today was 119. He did not bring his meter.  Said that they had been low, but since dropping the dose, to 20u, his blood sugars have not dropped.   Discussed what is a "larger meal"--when eating 2 or more fried things, when eating pizza, pasta, or large amounts of carbs.  We discussed what carbs were, and he reports good understanding of this.   He is drinking small orange juice with medications in the AM, but that is the only juice.  No sodas.   Discussed the idea of lower the fat in his diet--biscuits only 1=2 X/wk,-switching to toast or bun, only one fried thing in a meal--ie when having fried chicken, dont have fried okra, have green beans.  He reported good understanding of this. Also discussed limiting his HS snacking to no more than 200 calories.  Suggestions given for this.   They had no final questions.

## 2015-04-28 ENCOUNTER — Telehealth: Payer: Self-pay

## 2015-04-28 NOTE — Telephone Encounter (Signed)
S/w patient's wife Horris Latino who reports the following BP readings: 6/8  5:15pm 167/74 6/9  7:00am 172/76  States he has taken medications as directed and has taken two lasix as prescribed by Dr. Fletcher [name] with minimal improvement  Awaiting lab results Will forward to MD

## 2015-04-29 ENCOUNTER — Other Ambulatory Visit: Payer: Self-pay

## 2015-04-29 LAB — BASIC METABOLIC PANEL
BUN/Creatinine Ratio: 19 (ref 10–22)
BUN: 17 mg/dL (ref 8–27)
CALCIUM: 9 mg/dL (ref 8.6–10.2)
CO2: 20 mmol/L (ref 18–29)
Chloride: 97 mmol/L (ref 97–108)
Creatinine, Ser: 0.9 mg/dL (ref 0.76–1.27)
GFR calc non Af Amer: 86 mL/min/{1.73_m2} (ref >59)
GFR, EST AFRICAN AMERICAN: 100 mL/min/{1.73_m2} (ref >59)
Glucose: 138 mg/dL — ABNORMAL HIGH (ref 65–99)
POTASSIUM: 4.8 mmol/L (ref 3.5–5.2)
Sodium: 140 mmol/L (ref 134–144)

## 2015-04-29 MED ORDER — SPIRONOLACTONE 25 MG PO TABS: 50.0000 mg | ORAL_TABLET | Freq: Every day | ORAL | Status: DC

## 2015-04-29 NOTE — Telephone Encounter (Signed)
S/w wife Horris Latino and informed per Dr. Fletcher [name], increase spironolactone to 50mg  daily Pt wife states she would like refill in 25mg  tablets in case BP drops low and medication dosage is decreased.  She prefers not to have to cut pills. Confirmed pharmacy. Pt verbalized understanding with no further questions.

## 2015-04-29 NOTE — Telephone Encounter (Signed)
Increase spironolactone to 50 mg once daily

## 2015-05-02 DIAGNOSIS — B351 Tinea unguium: Secondary | ICD-10-CM | POA: Diagnosis not present

## 2015-05-02 DIAGNOSIS — E1142 Type 2 diabetes mellitus with diabetic polyneuropathy: Secondary | ICD-10-CM | POA: Diagnosis not present

## 2015-05-02 LAB — HM DIABETES FOOT EXAM

## 2015-05-04 DIAGNOSIS — M542 Cervicalgia: Secondary | ICD-10-CM | POA: Diagnosis not present

## 2015-05-04 DIAGNOSIS — M545 Low back pain: Secondary | ICD-10-CM | POA: Diagnosis not present

## 2015-05-04 DIAGNOSIS — M6281 Muscle weakness (generalized): Secondary | ICD-10-CM | POA: Diagnosis not present

## 2015-05-04 DIAGNOSIS — R200 Anesthesia of skin: Secondary | ICD-10-CM | POA: Diagnosis not present

## 2015-05-05 ENCOUNTER — Encounter: Payer: Self-pay | Admitting: Cardiovascular Disease

## 2015-05-05 ENCOUNTER — Encounter: Payer: Self-pay | Admitting: Internal Medicine

## 2015-05-05 ENCOUNTER — Emergency Department
Admission: EM | Admit: 2015-05-05 | Discharge: 2015-05-05 | Discharge status: Against Medical Advice | Payer: Medicare Other | Attending: Emergency Medicine | Admitting: Emergency Medicine

## 2015-05-05 ENCOUNTER — Observation Stay (HOSPITAL_COMMUNITY)
Admission: EM | Admit: 2015-05-05 | Discharge: 2015-05-09 | Disposition: A | Discharge status: Home / Self Care | Payer: Medicare Other | Attending: Internal Medicine | Admitting: Internal Medicine

## 2015-05-05 ENCOUNTER — Encounter: Payer: Self-pay | Admitting: Emergency Medicine

## 2015-05-05 ENCOUNTER — Other Ambulatory Visit: Payer: Self-pay

## 2015-05-05 ENCOUNTER — Encounter (HOSPITAL_COMMUNITY): Payer: Self-pay | Admitting: Physical Medicine and Rehabilitation

## 2015-05-05 ENCOUNTER — Emergency Department (HOSPITAL_COMMUNITY): Payer: Medicare Other

## 2015-05-05 DIAGNOSIS — N189 Chronic kidney disease, unspecified: Secondary | ICD-10-CM | POA: Diagnosis not present

## 2015-05-05 DIAGNOSIS — Z7982 Long term (current) use of aspirin: Secondary | ICD-10-CM | POA: Insufficient documentation

## 2015-05-05 DIAGNOSIS — M549 Dorsalgia, unspecified: Secondary | ICD-10-CM | POA: Insufficient documentation

## 2015-05-05 DIAGNOSIS — M545 Low back pain: Secondary | ICD-10-CM | POA: Insufficient documentation

## 2015-05-05 DIAGNOSIS — E669 Obesity, unspecified: Secondary | ICD-10-CM | POA: Insufficient documentation

## 2015-05-05 DIAGNOSIS — E1149 Type 2 diabetes mellitus with other diabetic neurological complication: Secondary | ICD-10-CM | POA: Insufficient documentation

## 2015-05-05 DIAGNOSIS — G8929 Other chronic pain: Secondary | ICD-10-CM | POA: Diagnosis not present

## 2015-05-05 DIAGNOSIS — I10 Essential (primary) hypertension: Secondary | ICD-10-CM | POA: Insufficient documentation

## 2015-05-05 DIAGNOSIS — T82897A Other specified complication of cardiac prosthetic devices, implants and grafts, initial encounter: Secondary | ICD-10-CM | POA: Diagnosis present

## 2015-05-05 DIAGNOSIS — Z885 Allergy status to narcotic agent status: Secondary | ICD-10-CM | POA: Diagnosis not present

## 2015-05-05 DIAGNOSIS — G44229 Chronic tension-type headache, not intractable: Secondary | ICD-10-CM

## 2015-05-05 DIAGNOSIS — Z7952 Long term (current) use of systemic steroids: Secondary | ICD-10-CM | POA: Insufficient documentation

## 2015-05-05 DIAGNOSIS — Z79899 Other long term (current) drug therapy: Secondary | ICD-10-CM | POA: Diagnosis not present

## 2015-05-05 DIAGNOSIS — E785 Hyperlipidemia, unspecified: Secondary | ICD-10-CM | POA: Diagnosis not present

## 2015-05-05 DIAGNOSIS — G510 Bell's palsy: Secondary | ICD-10-CM | POA: Diagnosis not present

## 2015-05-05 DIAGNOSIS — M79605 Pain in left leg: Secondary | ICD-10-CM | POA: Diagnosis not present

## 2015-05-05 DIAGNOSIS — Z981 Arthrodesis status: Secondary | ICD-10-CM | POA: Insufficient documentation

## 2015-05-05 DIAGNOSIS — M5126 Other intervertebral disc displacement, lumbar region: Secondary | ICD-10-CM | POA: Diagnosis not present

## 2015-05-05 DIAGNOSIS — D509 Iron deficiency anemia, unspecified: Secondary | ICD-10-CM

## 2015-05-05 DIAGNOSIS — Z794 Long term (current) use of insulin: Secondary | ICD-10-CM | POA: Diagnosis not present

## 2015-05-05 DIAGNOSIS — L97529 Non-pressure chronic ulcer of other part of left foot with unspecified severity: Secondary | ICD-10-CM

## 2015-05-05 DIAGNOSIS — Z888 Allergy status to other drugs, medicaments and biological substances status: Secondary | ICD-10-CM | POA: Insufficient documentation

## 2015-05-05 DIAGNOSIS — E11621 Type 2 diabetes mellitus with foot ulcer: Secondary | ICD-10-CM

## 2015-05-05 DIAGNOSIS — Z87891 Personal history of nicotine dependence: Secondary | ICD-10-CM | POA: Insufficient documentation

## 2015-05-05 DIAGNOSIS — R531 Weakness: Secondary | ICD-10-CM | POA: Diagnosis not present

## 2015-05-05 DIAGNOSIS — Z6835 Body mass index (BMI) 35.0-35.9, adult: Secondary | ICD-10-CM | POA: Diagnosis not present

## 2015-05-05 DIAGNOSIS — Z91199 Patient's noncompliance with other medical treatment and regimen due to unspecified reason: Secondary | ICD-10-CM

## 2015-05-05 DIAGNOSIS — M6281 Muscle weakness (generalized): Secondary | ICD-10-CM | POA: Insufficient documentation

## 2015-05-05 DIAGNOSIS — E538 Deficiency of other specified B group vitamins: Secondary | ICD-10-CM

## 2015-05-05 DIAGNOSIS — M25552 Pain in left hip: Secondary | ICD-10-CM

## 2015-05-05 DIAGNOSIS — F329 Major depressive disorder, single episode, unspecified: Secondary | ICD-10-CM | POA: Diagnosis not present

## 2015-05-05 DIAGNOSIS — N401 Enlarged prostate with lower urinary tract symptoms: Secondary | ICD-10-CM

## 2015-05-05 DIAGNOSIS — M4806 Spinal stenosis, lumbar region: Secondary | ICD-10-CM | POA: Insufficient documentation

## 2015-05-05 DIAGNOSIS — I129 Hypertensive chronic kidney disease with stage 1 through stage 4 chronic kidney disease, or unspecified chronic kidney disease: Secondary | ICD-10-CM | POA: Insufficient documentation

## 2015-05-05 DIAGNOSIS — R29898 Other symptoms and signs involving the musculoskeletal system: Secondary | ICD-10-CM

## 2015-05-05 DIAGNOSIS — M79604 Pain in right leg: Secondary | ICD-10-CM

## 2015-05-05 DIAGNOSIS — Z1211 Encounter for screening for malignant neoplasm of colon: Secondary | ICD-10-CM

## 2015-05-05 DIAGNOSIS — I6350 Cerebral infarction due to unspecified occlusion or stenosis of unspecified cerebral artery: Secondary | ICD-10-CM

## 2015-05-05 DIAGNOSIS — M25475 Effusion, left foot: Secondary | ICD-10-CM

## 2015-05-05 DIAGNOSIS — T82897S Other specified complication of cardiac prosthetic devices, implants and grafts, sequela: Secondary | ICD-10-CM

## 2015-05-05 DIAGNOSIS — N138 Other obstructive and reflux uropathy: Secondary | ICD-10-CM

## 2015-05-05 DIAGNOSIS — I2510 Atherosclerotic heart disease of native coronary artery without angina pectoris: Secondary | ICD-10-CM | POA: Diagnosis not present

## 2015-05-05 DIAGNOSIS — M5416 Radiculopathy, lumbar region: Secondary | ICD-10-CM

## 2015-05-05 DIAGNOSIS — I200 Unstable angina: Secondary | ICD-10-CM

## 2015-05-05 DIAGNOSIS — M5010 Cervical disc disorder with radiculopathy, unspecified cervical region: Secondary | ICD-10-CM

## 2015-05-05 DIAGNOSIS — Z09 Encounter for follow-up examination after completed treatment for conditions other than malignant neoplasm: Secondary | ICD-10-CM

## 2015-05-05 DIAGNOSIS — M79662 Pain in left lower leg: Secondary | ICD-10-CM

## 2015-05-05 DIAGNOSIS — K5900 Constipation, unspecified: Secondary | ICD-10-CM | POA: Diagnosis not present

## 2015-05-05 DIAGNOSIS — R269 Unspecified abnormalities of gait and mobility: Secondary | ICD-10-CM | POA: Diagnosis not present

## 2015-05-05 DIAGNOSIS — G20 Parkinson's disease: Secondary | ICD-10-CM | POA: Insufficient documentation

## 2015-05-05 DIAGNOSIS — Z9119 Patient's noncompliance with other medical treatment and regimen: Secondary | ICD-10-CM

## 2015-05-05 DIAGNOSIS — M5136 Other intervertebral disc degeneration, lumbar region: Secondary | ICD-10-CM | POA: Insufficient documentation

## 2015-05-05 DIAGNOSIS — Z8673 Personal history of transient ischemic attack (TIA), and cerebral infarction without residual deficits: Secondary | ICD-10-CM | POA: Insufficient documentation

## 2015-05-05 DIAGNOSIS — IMO0002 Reserved for concepts with insufficient information to code with codable children: Secondary | ICD-10-CM

## 2015-05-05 DIAGNOSIS — Z7902 Long term (current) use of antithrombotics/antiplatelets: Secondary | ICD-10-CM | POA: Insufficient documentation

## 2015-05-05 DIAGNOSIS — E119 Type 2 diabetes mellitus without complications: Secondary | ICD-10-CM | POA: Insufficient documentation

## 2015-05-05 DIAGNOSIS — E1140 Type 2 diabetes mellitus with diabetic neuropathy, unspecified: Secondary | ICD-10-CM | POA: Diagnosis not present

## 2015-05-05 DIAGNOSIS — R339 Retention of urine, unspecified: Secondary | ICD-10-CM

## 2015-05-05 DIAGNOSIS — I2583 Coronary atherosclerosis due to lipid rich plaque: Secondary | ICD-10-CM

## 2015-05-05 DIAGNOSIS — L97519 Non-pressure chronic ulcer of other part of right foot with unspecified severity: Secondary | ICD-10-CM

## 2015-05-05 DIAGNOSIS — R972 Elevated prostate specific antigen [PSA]: Secondary | ICD-10-CM

## 2015-05-05 DIAGNOSIS — F32A Depression, unspecified: Secondary | ICD-10-CM

## 2015-05-05 DIAGNOSIS — C44629 Squamous cell carcinoma of skin of left upper limb, including shoulder: Secondary | ICD-10-CM

## 2015-05-05 DIAGNOSIS — M542 Cervicalgia: Secondary | ICD-10-CM

## 2015-05-05 DIAGNOSIS — M47816 Spondylosis without myelopathy or radiculopathy, lumbar region: Secondary | ICD-10-CM | POA: Diagnosis not present

## 2015-05-05 DIAGNOSIS — M25559 Pain in unspecified hip: Secondary | ICD-10-CM

## 2015-05-05 LAB — URINALYSIS, ROUTINE W REFLEX MICROSCOPIC
Bilirubin Urine: NEGATIVE
GLUCOSE, UA: 250 mg/dL — AB
Hgb urine dipstick: NEGATIVE
Ketones, ur: 15 mg/dL — AB
Leukocytes, UA: NEGATIVE
NITRITE: NEGATIVE
Protein, ur: >300 mg/dL — AB
Specific Gravity, Urine: 1.016 (ref 1.005–1.030)
Urobilinogen, UA: 1 mg/dL (ref 0.0–1.0)
pH: 5.5 (ref 5.0–8.0)

## 2015-05-05 LAB — CBC WITH DIFFERENTIAL/PLATELET
Basophils Absolute: 0 10*3/uL (ref 0.0–0.1)
Basophils Relative: 0 % (ref 0–1)
Eosinophils Absolute: 0.3 10*3/uL (ref 0.0–0.7)
Eosinophils Relative: 3 % (ref 0–5)
HCT: 43.4 % (ref 39.0–52.0)
Hemoglobin: 14.4 g/dL (ref 13.0–17.0)
Lymphocytes Relative: 18 % (ref 12–46)
Lymphs Abs: 2 10*3/uL (ref 0.7–4.0)
MCH: 25.4 pg — ABNORMAL LOW (ref 26.0–34.0)
MCHC: 33.2 g/dL (ref 30.0–36.0)
MCV: 76.5 fL — ABNORMAL LOW (ref 78.0–100.0)
Monocytes Absolute: 0.7 10*3/uL (ref 0.1–1.0)
Monocytes Relative: 7 % (ref 3–12)
NEUTROS ABS: 8 10*3/uL — AB (ref 1.7–7.7)
Neutrophils Relative %: 72 % (ref 43–77)
Platelets: 191 10*3/uL (ref 150–400)
RBC: 5.67 MIL/uL (ref 4.22–5.81)
RDW: 17.9 % — AB (ref 11.5–15.5)
WBC: 11 10*3/uL — AB (ref 4.0–10.5)

## 2015-05-05 LAB — BASIC METABOLIC PANEL
ANION GAP: 16 — AB (ref 5–15)
BUN: 13 mg/dL (ref 6–20)
CALCIUM: 8.8 mg/dL — AB (ref 8.9–10.3)
CO2: 21 mmol/L — AB (ref 22–32)
CREATININE: 1.11 mg/dL (ref 0.61–1.24)
Chloride: 101 mmol/L (ref 101–111)
GFR calc Af Amer: >60 mL/min (ref >60)
GFR calc non Af Amer: >60 mL/min (ref >60)
GLUCOSE: 286 mg/dL — AB (ref 65–99)
Potassium: 3.9 mmol/L (ref 3.5–5.1)
Sodium: 138 mmol/L (ref 135–145)

## 2015-05-05 LAB — GLUCOSE, CAPILLARY: Glucose-Capillary: 374 mg/dL — ABNORMAL HIGH (ref 65–99)

## 2015-05-05 LAB — URINE MICROSCOPIC-ADD ON

## 2015-05-05 MED ORDER — LISINOPRIL-HYDROCHLOROTHIAZIDE 20-12.5 MG PO TABS: 1.0000 | ORAL_TABLET | Freq: Two times a day (BID) | ORAL | Status: DC

## 2015-05-05 MED ORDER — GABAPENTIN 300 MG PO CAPS
300.0000 mg | ORAL_CAPSULE | Freq: Every day | ORAL | Status: DC
  Administered 2015-05-05: 300 mg via ORAL
  Filled 2015-05-05: qty 1

## 2015-05-05 MED ORDER — HYDROCODONE-ACETAMINOPHEN 5-325 MG PO TABS
1.0000 | ORAL_TABLET | Freq: Three times a day (TID) | ORAL | Status: DC | PRN
  Administered 2015-05-05 – 2015-05-08 (×3): 2 via ORAL
  Filled 2015-05-05 (×3): qty 2

## 2015-05-05 MED ORDER — ASPIRIN EC 81 MG PO TBEC
81.0000 mg | DELAYED_RELEASE_TABLET | Freq: Three times a day (TID) | ORAL | Status: DC
  Administered 2015-05-05 – 2015-05-09 (×12): 81 mg via ORAL
  Filled 2015-05-05 (×12): qty 1

## 2015-05-05 MED ORDER — SODIUM CHLORIDE 0.9 % IJ SOLN
3.0000 mL | Freq: Two times a day (BID) | INTRAMUSCULAR | Status: DC
  Administered 2015-05-05 – 2015-05-09 (×7): 3 mL via INTRAVENOUS

## 2015-05-05 MED ORDER — HYDROCHLOROTHIAZIDE 12.5 MG PO CAPS
12.5000 mg | ORAL_CAPSULE | Freq: Every day | ORAL | Status: DC
  Administered 2015-05-06 – 2015-05-09 (×4): 12.5 mg via ORAL
  Filled 2015-05-05 (×4): qty 1

## 2015-05-05 MED ORDER — CARVEDILOL 25 MG PO TABS
25.0000 mg | ORAL_TABLET | Freq: Two times a day (BID) | ORAL | Status: DC
  Administered 2015-05-05 – 2015-05-09 (×8): 25 mg via ORAL
  Filled 2015-05-05 (×8): qty 1

## 2015-05-05 MED ORDER — FUROSEMIDE 20 MG PO TABS
20.0000 mg | ORAL_TABLET | Freq: Every day | ORAL | Status: DC
  Administered 2015-05-07: 20 mg via ORAL
  Filled 2015-05-05 (×2): qty 1

## 2015-05-05 MED ORDER — PREGABALIN 75 MG PO CAPS
150.0000 mg | ORAL_CAPSULE | Freq: Two times a day (BID) | ORAL | Status: DC
  Administered 2015-05-05 – 2015-05-07 (×4): 150 mg via ORAL
  Filled 2015-05-05 (×4): qty 2

## 2015-05-05 MED ORDER — ONDANSETRON HCL 4 MG/2ML IJ SOLN
4.0000 mg | Freq: Once | INTRAMUSCULAR | Status: AC
  Administered 2015-05-05: 4 mg via INTRAVENOUS
  Filled 2015-05-05: qty 2

## 2015-05-05 MED ORDER — HYDROMORPHONE HCL 1 MG/ML IJ SOLN
1.0000 mg | INTRAMUSCULAR | Status: DC | PRN
  Administered 2015-05-06 – 2015-05-09 (×11): 1 mg via INTRAVENOUS
  Filled 2015-05-05 (×11): qty 1

## 2015-05-05 MED ORDER — SODIUM CHLORIDE 0.9 % IJ SOLN: 3.0000 mL | INTRAMUSCULAR | Status: DC | PRN

## 2015-05-05 MED ORDER — TIZANIDINE HCL 2 MG PO TABS
4.0000 mg | ORAL_TABLET | Freq: Three times a day (TID) | ORAL | Status: DC | PRN
  Administered 2015-05-05 – 2015-05-06 (×2): 4 mg via ORAL
  Filled 2015-05-05 (×2): qty 2

## 2015-05-05 MED ORDER — SPIRONOLACTONE 50 MG PO TABS
50.0000 mg | ORAL_TABLET | Freq: Every day | ORAL | Status: DC
  Administered 2015-05-06 – 2015-05-07 (×2): 50 mg via ORAL
  Filled 2015-05-05 (×2): qty 1

## 2015-05-05 MED ORDER — CLOPIDOGREL BISULFATE 75 MG PO TABS
75.0000 mg | ORAL_TABLET | Freq: Every day | ORAL | Status: DC
  Filled 2015-05-05: qty 1

## 2015-05-05 MED ORDER — PANTOPRAZOLE SODIUM 40 MG PO TBEC
40.0000 mg | DELAYED_RELEASE_TABLET | Freq: Two times a day (BID) | ORAL | Status: DC
  Administered 2015-05-05 – 2015-05-09 (×8): 40 mg via ORAL
  Filled 2015-05-05 (×7): qty 1

## 2015-05-05 MED ORDER — HYDROMORPHONE HCL 1 MG/ML IJ SOLN
0.5000 mg | Freq: Once | INTRAMUSCULAR | Status: AC
  Administered 2015-05-05: 0.5 mg via INTRAVENOUS
  Filled 2015-05-05: qty 1

## 2015-05-05 MED ORDER — HYDROCHLOROTHIAZIDE 25 MG PO TABS: 25.0000 mg | ORAL_TABLET | Freq: Every day | ORAL | Status: DC

## 2015-05-05 MED ORDER — NITROGLYCERIN 0.4 MG SL SUBL: 0.4000 mg | SUBLINGUAL_TABLET | SUBLINGUAL | Status: DC | PRN

## 2015-05-05 MED ORDER — ACETAMINOPHEN 325 MG PO TABS: 650.0000 mg | ORAL_TABLET | ORAL | Status: DC | PRN

## 2015-05-05 MED ORDER — CYANOCOBALAMIN 1000 MCG/ML IJ SOLN: 1000.0000 ug | INTRAMUSCULAR | Status: DC

## 2015-05-05 MED ORDER — AMLODIPINE BESYLATE 5 MG PO TABS
5.0000 mg | ORAL_TABLET | Freq: Every day | ORAL | Status: DC
  Administered 2015-05-06 – 2015-05-09 (×4): 5 mg via ORAL
  Filled 2015-05-05 (×4): qty 1

## 2015-05-05 MED ORDER — ONDANSETRON HCL 4 MG/2ML IJ SOLN: 4.0000 mg | Freq: Three times a day (TID) | INTRAMUSCULAR | Status: DC | PRN

## 2015-05-05 MED ORDER — SIMVASTATIN 20 MG PO TABS
20.0000 mg | ORAL_TABLET | Freq: Every day | ORAL | Status: DC
  Administered 2015-05-05 – 2015-05-08 (×4): 20 mg via ORAL
  Filled 2015-05-05 (×4): qty 1

## 2015-05-05 MED ORDER — INSULIN ASPART 100 UNIT/ML ~~LOC~~ SOLN
0.0000 [IU] | Freq: Three times a day (TID) | SUBCUTANEOUS | Status: DC
  Administered 2015-05-06: 9 [IU] via SUBCUTANEOUS

## 2015-05-05 MED ORDER — LISINOPRIL 20 MG PO TABS: 20.0000 mg | ORAL_TABLET | Freq: Every day | ORAL | Status: DC

## 2015-05-05 MED ORDER — ONDANSETRON HCL 4 MG PO TABS
4.0000 mg | ORAL_TABLET | Freq: Four times a day (QID) | ORAL | Status: DC | PRN
  Administered 2015-05-09: 4 mg via ORAL
  Filled 2015-05-05: qty 1

## 2015-05-05 MED ORDER — PREDNISONE 20 MG PO TABS
20.0000 mg | ORAL_TABLET | Freq: Every day | ORAL | Status: DC
  Administered 2015-05-07: 20 mg via ORAL
  Filled 2015-05-05 (×2): qty 1

## 2015-05-05 MED ORDER — CARBIDOPA-LEVODOPA 25-100 MG PO TABS
2.0000 | ORAL_TABLET | Freq: Three times a day (TID) | ORAL | Status: DC
Start: 2015-05-05 — End: 2015-05-06
  Administered 2015-05-05 – 2015-05-06 (×2): 2 via ORAL
  Filled 2015-05-05 (×2): qty 2

## 2015-05-05 MED ORDER — LISINOPRIL 20 MG PO TABS
20.0000 mg | ORAL_TABLET | Freq: Every day | ORAL | Status: DC
  Administered 2015-05-06 – 2015-05-09 (×4): 20 mg via ORAL
  Filled 2015-05-05 (×4): qty 1

## 2015-05-05 MED ORDER — SODIUM CHLORIDE 0.9 % IV SOLN: 250.0000 mL | INTRAVENOUS | Status: DC | PRN

## 2015-05-05 MED ORDER — ONDANSETRON HCL 4 MG/2ML IJ SOLN
4.0000 mg | Freq: Four times a day (QID) | INTRAMUSCULAR | Status: DC | PRN
  Administered 2015-05-09: 4 mg via INTRAVENOUS
  Filled 2015-05-05: qty 2

## 2015-05-05 NOTE — ED Provider Notes (Signed)
CSN: [id number]     Arrival date & time 05/05/15  1422 History   First MD Initiated Contact with Patient 05/05/15 1552     Chief Complaint  Patient presents with  . Back Pain     (Consider location/radiation/quality/duration/timing/severity/associated sxs/prior Treatment) HPI Comments: Patient with h/o ACS with stenting most recent cath 02/2015 and is currently on Plavix, history of cervical fusion, DM, HTN -- presents with 2 months of severe lower back pain with left-sided radicular features. Patient has been seen by a PCP, physiatrist [facility], neurosurgeon and has received steroid injections into the back 3, most recently 2 weeks ago. These have provided very little relief. Patient is also on oral Vicodin 5/325 3 times a day. He was on oral prednisone in the past but is not currently taking this due to diabetes. Patient was told that he needed a myelogram but was unable to have this performed because of his Plavix use and it sounds as if his cardiologist is reluctant to take him off the Plavix at the current time. Patient's ability to perform ADLs has decreased significantly recently. Patient is confined to a chair at home and cannot transfer or walk even with a walker. He has fallen. He states that his left leg is very weak. No recent injuries. Patient denies warning symptoms of back pain including: fecal incontinence, urinary retention or overflow incontinence, night sweats, unexplained fevers or weight loss, h/o active cancer, IVDU, recent trauma.    [facility] dated 03/01/2015 demonstrates at L5-S1, there is minimal disc desiccation with mild broad-based disc bulging. There is a left foraminal annular tear. There is mild left foraminal stenosis. There is no right foraminal stenosis. There is no central stenosis. There are moderate facet degenerative changes. At L4-5 there is mild disc desiccation, mild disc bulging, and mild bilateral lateral recess stenosis. There is no significant central  or foraminal stenosis. The remaining levels are unremarkable. He is status post T12 kyphoplasty.   Patient is a 69 y.o. male presenting with back pain. The history is provided by the patient and medical records.  Back Pain Associated symptoms: no abdominal pain, no chest pain, no dysuria, no fever, no headaches, no numbness and no weakness     Past Medical History  Diagnosis Date  . Shingles   . Diabetes mellitus   . Hyperlipidemia   . Broken leg     left...s/p pins  . Lower leg pain     chronic ,left leg  . Acute angina   . Depression   . CAD (coronary artery disease)     s/p multiple PCI with stent RCA,LAD and obtuse marginal,followed @ [facility] on the accord study.., cath 02/2012 90% D1, s/p drug-eluting stent Dr. Fletcher [name]  . Falls 09/2011  . Dizziness   . Shortness of breath   . Headache(784.0)   . Hypertension     dr Jerilynn Mages Aud[facility] [place]     [facility] in [place]  . Stroke   . Bell's palsy   . Parkinson disease   . Neuropathy    Past Surgical History  Procedure Laterality Date  . Appendectomy    . Cholecystectomy    . Left leg surgery    . Left foot surgery    . Drainage port in left testicle    . Cardiac catheterization  02/2012  . Back surgery    . Anterior cervical decomp/discectomy fusion N/A 04/30/2013    Procedure: ANTERIOR CERVICAL DECOMPRESSION/DISCECTOMY FUSION 2 LEVELS;  Surgeon: Faythe Ghee, MD;  Location: [facility];  Service: Neurosurgery;  Laterality: N/A;  Cervical four-five, Cervical six-seven anterior cervical decompression fusion with trabecular metal cage and plate  . Left heart catheterization with coronary angiogram N/A 02/23/2015    Procedure: LEFT HEART CATHETERIZATION WITH CORONARY ANGIOGRAM;  Surgeon: [name], MD;  Location: [place] [facility];  Service: Cardiovascular;  Laterality: N/A;   Family History  Problem Relation Age of Onset  . Cancer Mother     BREAST  . Heart disease Mother     STENT  . Cancer Father     THROAT  . Heart disease Sister      STENT; HTN  . Heart attack Brother   . Hypertension Brother   . Hypertension Sister    History  Substance Use Topics  . Smoking status: Former Smoker    Quit date: 11/08/1971  . Smokeless tobacco: Never Used  . Alcohol Use: No    Review of Systems  Constitutional: Positive for appetite change. Negative for fever and unexpected weight change.  HENT: Negative for rhinorrhea and sore throat.   Eyes: Negative for redness.  Respiratory: Negative for cough.   Cardiovascular: Positive for leg swelling. Negative for chest pain.  Gastrointestinal: Negative for nausea, vomiting, abdominal pain, diarrhea and constipation.       Neg for fecal incontinence  Genitourinary: Negative for dysuria, hematuria, flank pain and difficulty urinating.       Negative for urinary incontinence or retention  Musculoskeletal: Positive for back pain. Negative for myalgias.  Skin: Negative for rash.  Neurological: Negative for weakness, numbness and headaches.       Negative for saddle paresthesias    Allergies  Codeine; Tramadol; and Trazodone and nefazodone  Home Medications   Prior to Admission medications   Medication Sig Start Date End Date Taking? Authorizing Provider  acetaminophen (TYLENOL) 325 MG tablet Take 2 tablets (650 mg total) by mouth every 4 (four) hours as needed for headache or mild pain. 02/24/15   Erlene Quan, PA-C  amLODipine (NORVASC) 5 MG tablet Take 1 tablet (5 mg total) by mouth daily. 02/16/15   Jackolyn Confer, MD  aspirin 81 MG tablet Take 81 mg by mouth 3 (three) times daily.    Historical Provider, MD  carbidopa-levodopa (SINEMET IR) 25-100 MG per tablet Take 2 tablets by mouth 3 (three) times daily. 02/16/15   Jackolyn Confer, MD  carvedilol (COREG) 25 MG tablet TAKE ONE TABLET BY MOUTH TWICE DAILY 03/31/15   Jackolyn Confer, MD  clopidogrel (PLAVIX) 75 MG tablet Take 1 tablet (75 mg total) by mouth daily with breakfast. 02/16/15   Jackolyn Confer, MD  cyanocobalamin  (,VITAMIN B-12,) 1000 MCG/ML injection Inject 1,000 mcg into the muscle once.    Historical Provider, MD  DULoxetine (CYMBALTA) 60 MG capsule TAKE 1 CAPSULE DAILY 02/16/15   Jackolyn Confer, MD  furosemide (LASIX) 20 MG tablet Take 1 tablet (20 mg total) by mouth daily. One tablet daily for two days 04/25/15   [name], MD  glucose blood (FREESTYLE TEST STRIPS) test strip Use to check blood sugar up to three times daily 10/27/14   Jackolyn Confer, MD  HYDROcodone-acetaminophen (NORCO/VICODIN) 5-325 MG per tablet Take 1-2 tablets by mouth every 8 (eight) hours as needed. 02/22/15   Jackolyn Confer, MD  Insulin Glargine (TOUJEO SOLOSTAR) 300 UNIT/ML SOPN Inject 55 Units into the skin 2 (two) times daily at 8 am and 10 pm. Patient taking differently: Inject  70 Units into the skin 2 (two) times daily at 8 am and 10 pm.  01/28/15   Philemon Kingdom, MD  insulin lispro (HUMALOG) 100 UNIT/ML injection Inject 0.5-0.55 mLs (50-55 Units total) into the skin 3 (three) times daily before meals. 04/04/15   Philemon Kingdom, MD  insulin regular human CONCENTRATED (HUMULIN R U-500 KWIKPEN) 500 UNIT/ML SOLN injection Inject 90 units before each of the 3 meals, under skin, 30 min before meals 04/05/15   Philemon Kingdom, MD  lisinopril-hydrochlorothiazide (PRINZIDE,ZESTORETIC) 20-12.5 MG per tablet Take 1 tablet by mouth 2 (two) times daily. 10/27/14   Jackolyn Confer, MD  metFORMIN (GLUCOPHAGE) 1000 MG tablet TAKE ONE TABLET BY MOUTH TWICE DAILY 02/26/15   Erlene Quan, PA-C  nitroGLYCERIN (NITROSTAT) 0.4 MG SL tablet Place 1 tablet (0.4 mg total) under the tongue every 5 (five) minutes as needed for chest pain. 05/25/13   Jackolyn Confer, MD  pantoprazole (PROTONIX) 40 MG tablet Take 1 tablet (40 mg total) by mouth 2 (two) times daily. 10/28/14   Jackolyn Confer, MD  predniSONE (DELTASONE) 20 MG tablet Take 1 tablet (20 mg total) by mouth daily with breakfast. 04/04/15   Jackolyn Confer, MD  pregabalin  (LYRICA) 150 MG capsule Take 1 capsule (150 mg total) by mouth 2 (two) times daily. 04/19/15   Jackolyn Confer, MD  simvastatin (ZOCOR) 20 MG tablet Take 1 tablet (20 mg total) by mouth at bedtime. 03/08/15   [name], MD  spironolactone (ALDACTONE) 25 MG tablet Take 2 tablets (50 mg total) by mouth daily. 04/29/15   [name], MD  tiZANidine (ZANAFLEX) 4 MG tablet Take 1 tablet (4 mg total) by mouth every 8 (eight) hours as needed. for pain 03/11/15   Rubbie Battiest, NP   BP 179/92 mmHg  Pulse 80  Temp(Src) 97.9 F (36.6 C) (Oral)  Resp 18  SpO2 96%   Physical Exam  Constitutional: He appears well-developed and well-nourished.  HENT:  Head: Normocephalic and atraumatic.  Eyes: Conjunctivae are normal. Right eye exhibits no discharge. Left eye exhibits no discharge.  Neck: Normal range of motion. Neck supple.  Cardiovascular: Normal rate, regular rhythm and normal heart sounds.   Pulmonary/Chest: Effort normal and breath sounds normal.  Abdominal: Soft. There is no tenderness. There is no CVA tenderness.  Musculoskeletal: Normal range of motion.       Right hip: Normal.       Left hip: He exhibits decreased strength.       Right knee: He exhibits normal range of motion.       Left knee: He exhibits normal range of motion and no swelling.       Right ankle: He exhibits swelling. He exhibits normal range of motion. No tenderness.       Left ankle: He exhibits swelling. He exhibits normal range of motion. No tenderness.       Cervical back: He exhibits normal range of motion, no tenderness and no bony tenderness.       Thoracic back: He exhibits normal range of motion, no tenderness and no bony tenderness.       Lumbar back: He exhibits tenderness. He exhibits normal range of motion and no bony tenderness.       Back:       Right foot: Normal. There is normal range of motion.       Left foot: Normal. There is normal range of motion.  No step-off noted with  palpation of  spine.   Neurological: He is alert. He has normal reflexes. No sensory deficit. He exhibits normal muscle tone.  Patient with decreased strength in both legs, can hold R leg off bed against gravity for brief period however cannot at all with left side. Decreased strength flexion/extension L ankle.   Skin: Skin is warm and dry.  Psychiatric: He has a normal mood and affect.  Nursing note and vitals reviewed.   ED Course  Procedures (including critical care time) Labs Review Labs Reviewed  CBC WITH DIFFERENTIAL/PLATELET - Abnormal; Notable for the following:    WBC 11.0 (*)    MCV 76.5 (*)    MCH 25.4 (*)    RDW 17.9 (*)    Neutro Abs 8.0 (*)    All other components within normal limits  BASIC METABOLIC PANEL - Abnormal; Notable for the following:    CO2 21 (*)    Glucose, Bld 286 (*)    Calcium 8.8 (*)    Anion gap 16 (*)    All other components within normal limits  URINALYSIS, ROUTINE W REFLEX MICROSCOPIC (NOT AT [facility])    Imaging Review Mr Lumbar Spine Wo Contrast  05/05/2015   CLINICAL DATA:  Worsening left lower extremity weakness.  EXAM: MRI LUMBAR SPINE WITHOUT CONTRAST  TECHNIQUE: Multiplanar, multisequence MR imaging of the lumbar spine was performed. No intravenous contrast was administered.  COMPARISON:  03/01/2015  FINDINGS: Vertebral alignment is within normal limits. Prior vertebral augmentation is again noted at T12. Lumbar vertebral body heights are preserved. No vertebral marrow edema is seen. Vertebral body heights are relatively well preserved. Conus medullaris is normal in signal and terminates at the inferior aspect of L1. Tiny right renal cyst is again noted.  L1-2 and L2-3:  Negative.  L3-4: Minimal disc bulging and mild facet hypertrophy without stenosis, unchanged.  L4-5: Mild disc bulging asymmetric to the left and mild facet hypertrophy result in minimal left lateral recess narrowing, unchanged. No spinal canal or neural foraminal stenosis.  L5-S1: Shallow left  foraminal disc protrusion and mild facet hypertrophy without stenosis, unchanged.  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 No clear neural impingement.   Electronically Signed   By: [place] Bores   On: 05/05/2015 19:13     EKG Interpretation None       4:36 PM Patient seen and examined. Work-up initiated. Discussed with Dr. Wyvonnia Dusky who will see.    Vital signs reviewed and are as follows: BP 179/92 mmHg  Pulse 80  Temp(Src) 97.9 F (36.6 C) (Oral)  Resp 18  SpO2 96%  7:35 PM MRI results reviewed with patient. Will ask hospitalist to see.   Dr. Shanon Brow will see, med/obs.   MDM   Final diagnoses:  Weakness  Intractable back pain   Admit.     Carlisle Cater, PA-C 05/06/15 [place], MD 05/06/15 [phone]

## 2015-05-05 NOTE — ED Notes (Signed)
Pt presents to department for evaluation of lower back pain radiating down L leg. Pt had cardiac cath 02/2015, since procedure pt reports severe lower back pain, intermittently radiates down L leg. Upon arrival pt states 8/10 pain today, reports he was unable to stand up from toilet this morning. Pt is alert and oriented x4.

## 2015-05-05 NOTE — ED Notes (Signed)
Attempted report 

## 2015-05-05 NOTE — ED Notes (Signed)
Pt and family refused to continue assessment and exam after arrival. Apparently, they asked to have pt taken to [facility] in [place], but he was brought here. When pt realized he was here, he asked this nurse to call his wife because she was enroute to [facility]. She said to stop working him up and that he needed to be transported to [facility]. Dr. Corky Downs advised that pt could be worked up here, and that we could transfer him if medically necessary, but pt and his wife insisted on putting him in their vehicle and going to [place]. IV removed and pt loaded into vehicle with family for trip to [facility].

## 2015-05-05 NOTE — ED Provider Notes (Addendum)
[facility] Emergency Department Provider Note  ____________________________________________  Time seen: On arrival  I have reviewed the triage vital signs and the nursing notes.   HISTORY  Chief Complaint Extremity Weakness      HPI Steve Phillips is a 69 y.o. male presents with left leg weakness for approximately 2 months. He reports he had difficulty getting up off the bathroom today. He had no fall. He reports he developed this weakness in his leg after having a cardiac catheterization. He has had multiple spinal injections, and an MRI and in an attempt to figure out the cause. He is able to move his left leg but it is weak, this is not new. While speaking with the patient his wife arrived and said that he needed to go to [facility] and that's where she wanted was to take him originally. She refuses for him to stay here. She states she will take him AMA and drive him to [facility].      Past Medical History  Diagnosis Date  . Shingles   . Diabetes mellitus   . Hyperlipidemia   . Broken leg     left...s/p pins  . Lower leg pain     chronic ,left leg  . Acute angina   . Depression   . CAD (coronary artery disease)     s/p multiple PCI with stent RCA,LAD and obtuse marginal,followed @ [facility] on the accord study.., cath 02/2012 90% D1, s/p drug-eluting stent Dr. Fletcher [name]  . Falls 09/2011  . Dizziness   . Shortness of breath   . Headache(784.0)   . Hypertension     dr Jerilynn Mages Audelia Acton     labauer in [place]  . Stroke   . Bell's palsy   . Parkinson disease   . Neuropathy     Patient Active Problem List   Diagnosis Date Noted  . Hospital discharge follow-up 03/20/2015  . Lumbar radicular pain 03/20/2015  . Pain of left calf-r/o DVT 02/24/2015  . Obesity-BMI 35 02/24/2015  . Unstable angina 02/23/2015  . Right leg pain 11/09/2014  . Squamous cell cancer of skin of left forearm 08/18/2014  . Stroke, embolic, within last 8 weeks 06/28/2014  . Bell's  palsy 06/28/2014  . Cerebral artery occlusion with cerebral infarction 06/28/2014  . Cervical disc disorder with radiculopathy of cervical region 05/14/2014  . Elevated PSA, less than 10 ng/ml 02/04/2014  . Screening for colon cancer 02/04/2014  . Noncompliance with diabetes treatment 10/31/2013  . Benign prostatic hyperplasia with urinary obstruction 09/21/2013  . Abnormal prostate specific antigen 09/21/2013  . Incomplete bladder emptying 09/21/2013  . Headache 08/31/2013  . Osteoarthrosis, unspecified whether generalized or localized, shoulder region 02/23/2013  . Localized, primary osteoarthritis of shoulder region 06/10/2012  . Pain in shoulder 06/10/2012  . Iron deficiency anemia 04/21/2012  . B12 deficiency 04/09/2012  . Gait abnormality 02/07/2012  . Diabetic foot ulcer associated with type 2 diabetes mellitus 12/18/2011  . CAD (coronary artery disease) 07/04/2011  . Hypertension 07/04/2011  . Hyperlipidemia 07/04/2011  . DM (diabetes mellitus), type 2 with neurological complications 16/08/9603  . Depression 07/04/2011    Past Surgical History  Procedure Laterality Date  . Appendectomy    . Cholecystectomy    . Left leg surgery    . Left foot surgery    . Drainage port in left testicle    . Cardiac catheterization  02/2012  . Back surgery    . Anterior cervical decomp/discectomy fusion N/A  04/30/2013    Procedure: ANTERIOR CERVICAL DECOMPRESSION/DISCECTOMY FUSION 2 LEVELS;  Surgeon: Faythe Ghee, MD;  Location: [facility];  Service: Neurosurgery;  Laterality: N/A;  Cervical four-five, Cervical six-seven anterior cervical decompression fusion with trabecular metal cage and plate  . Left heart catheterization with coronary angiogram N/A 02/23/2015    Procedure: LEFT HEART CATHETERIZATION WITH CORONARY ANGIOGRAM;  Surgeon: [name], MD;  Location: [facility];  Service: Cardiovascular;  Laterality: N/A;    Current Outpatient Rx  Name  Route  Sig  Dispense  Refill   . acetaminophen (TYLENOL) 325 MG tablet   Oral   Take 2 tablets (650 mg total) by mouth every 4 (four) hours as needed for headache or mild pain.         Marland Kitchen amLODipine (NORVASC) 5 MG tablet   Oral   Take 1 tablet (5 mg total) by mouth daily.   90 tablet   3   . aspirin 81 MG tablet   Oral   Take 81 mg by mouth 3 (three) times daily.         . carbidopa-levodopa (SINEMET IR) 25-100 MG per tablet   Oral   Take 2 tablets by mouth 3 (three) times daily.   270 tablet   2   . carvedilol (COREG) 25 MG tablet      TAKE ONE TABLET BY MOUTH TWICE DAILY   180 tablet   1   . clopidogrel (PLAVIX) 75 MG tablet   Oral   Take 1 tablet (75 mg total) by mouth daily with breakfast.   90 tablet   1   . cyanocobalamin (,VITAMIN B-12,) 1000 MCG/ML injection   Intramuscular   Inject 1,000 mcg into the muscle once.         . DULoxetine (CYMBALTA) 60 MG capsule      TAKE 1 CAPSULE DAILY   90 capsule   3   . furosemide (LASIX) 20 MG tablet   Oral   Take 1 tablet (20 mg total) by mouth daily. One tablet daily for two days   3 tablet   0   . glucose blood (FREESTYLE TEST STRIPS) test strip      Use to check blood sugar up to three times daily   300 each   1     Dx code 250.00   . HYDROcodone-acetaminophen (NORCO/VICODIN) 5-325 MG per tablet   Oral   Take 1-2 tablets by mouth every 8 (eight) hours as needed.   90 tablet   0   . Insulin Glargine (TOUJEO SOLOSTAR) 300 UNIT/ML SOPN   Subcutaneous   Inject 55 Units into the skin 2 (two) times daily at 8 am and 10 pm. Patient taking differently: Inject 70 Units into the skin 2 (two) times daily at 8 am and 10 pm.    27 pen   1   . insulin lispro (HUMALOG) 100 UNIT/ML injection   Subcutaneous   Inject 0.5-0.55 mLs (50-55 Units total) into the skin 3 (three) times daily before meals.   50 mL   0     Switch from Novolog   . insulin regular human CONCENTRATED (HUMULIN R U-500 KWIKPEN) 500 UNIT/ML SOLN injection       Inject 90 units before each of the 3 meals, under skin, 30 min before meals   54 mL   3   . lisinopril-hydrochlorothiazide (PRINZIDE,ZESTORETIC) 20-12.5 MG per tablet   Oral   Take 1 tablet by mouth 2 (  two) times daily.   180 tablet   1   . metFORMIN (GLUCOPHAGE) 1000 MG tablet      TAKE ONE TABLET BY MOUTH TWICE DAILY   180 tablet   1   . nitroGLYCERIN (NITROSTAT) 0.4 MG SL tablet   Sublingual   Place 1 tablet (0.4 mg total) under the tongue every 5 (five) minutes as needed for chest pain.   30 tablet   6   . pantoprazole (PROTONIX) 40 MG tablet   Oral   Take 1 tablet (40 mg total) by mouth 2 (two) times daily.   180 tablet   3   . predniSONE (DELTASONE) 20 MG tablet   Oral   Take 1 tablet (20 mg total) by mouth daily with breakfast.   30 tablet   0   . pregabalin (LYRICA) 150 MG capsule   Oral   Take 1 capsule (150 mg total) by mouth 2 (two) times daily.   180 capsule   1   . simvastatin (ZOCOR) 20 MG tablet   Oral   Take 1 tablet (20 mg total) by mouth at bedtime.   90 tablet   3   . spironolactone (ALDACTONE) 25 MG tablet   Oral   Take 2 tablets (50 mg total) by mouth daily.   60 tablet   5   . tiZANidine (ZANAFLEX) 4 MG tablet   Oral   Take 1 tablet (4 mg total) by mouth every 8 (eight) hours as needed. for pain   63 tablet   0     Allergies Codeine; Tramadol; and Trazodone and nefazodone  Family History  Problem Relation Age of Onset  . Cancer Mother     BREAST  . Heart disease Mother     STENT  . Cancer Father     THROAT  . Heart disease Sister     STENT; HTN  . Heart attack Brother   . Hypertension Brother   . Hypertension Sister     Social History History  Substance Use Topics  . Smoking status: Former Smoker    Quit date: 11/08/1971  . Smokeless tobacco: Never Used  . Alcohol Use: No    Review of Systems  Constitutional: Negative for fever. Eyes: Negative for visual changes. ENT: Negative for sore  throat Cardiovascular: Negative for chest pain. Respiratory: Negative for shortness of breath. Gastrointestinal: Negative for abdominal pain, vomiting and diarrhea. Genitourinary: Negative for dysuria. Musculoskeletal: Negative for back pain. Skin: Negative for rash. Neurological: Negative for headaches. Left leg weakness   10-point ROS otherwise negative.  ____________________________________________   PHYSICAL EXAM:  VITAL SIGNS: ED Triage Vitals  Enc Vitals Group     BP 05/05/15 1337 192/97 mmHg     Pulse Rate 05/05/15 1337 86     Resp 05/05/15 1337 24     Temp --      Temp src --      SpO2 05/05/15 1337 95 %     Weight --      Height 05/05/15 1337 6' (1.829 m)     Head Cir --      Peak Flow --      Pain Score --      Pain Loc --      Pain Edu? --      Excl. in [place]? --      Constitutional: Alert and oriented. Well appearing and in no distress. Eyes: Conjunctivae are normal. PERRL.  Cardiovascular: Normal rate, regular rhythm. Normal and  symmetric distal pulses are present in all extremities. No murmurs, rubs, or gallops. Respiratory: Normal respiratory effort without tachypnea nor retractions. Breath sounds are clear and equal bilaterally.  Gastrointestinal: Soft and non-tender in all quadrants. No distention. There is no CVA tenderness. Genitourinary: deferred Musculoskeletal: Nontender with normal range of motion in all extremities. No lower extremity tenderness nor edema. He has normal blood flow to the lower extremity. Neurologic:  Normal speech and language. Patient is just able to lift his left leg off of the bed Skin:  Skin is warm, dry and intact. No rash noted. Psychiatric: Mood and affect are normal. Patient exhibits appropriate insight and judgment.  ____________________________________________    LABS (pertinent positives/negatives)  Labs Reviewed - No data to display  ____________________________________________   EKG  ED ECG REPORT I,  Lavonia Drafts, the attending physician, personally viewed and interpreted this ECG.   Date: 05/05/2015  EKG Time: 1:36 PM  Rate: 85  Rhythm: normal sinus rhythm, nonspecific ST and T waves changes  Axis: Normal axis  Intervals:none  ST&T Change: Nonspecific   ____________________________________________    RADIOLOGY  None  ____________________________________________   PROCEDURES  Procedure(s) performed: none  Critical Care performed: none  ____________________________________________   INITIAL IMPRESSION / ASSESSMENT AND PLAN / ED COURSE  Pertinent labs & imaging results that were available during my care of the patient were reviewed by me and considered in my medical decision making (see chart for details).  Wife is adamant that patient will be going to [facility]. Patient agrees with this. During my brief interaction with patient I believe that he is safe to travel by private vehicle given that the symptoms were going on for 2 months. Regardless his wife is taking him no matter what i say. We clarified EMS policy with the EMS director. Patient picked up in [place] and given the option of going to [place], but [facility] would be 45 minutes away so taken to [facility].. This seems appropriate to me.   Patient will be leaving AMA given I have not had time to fully evaluate him to rule out emergency medical conditions. ____________________________________________   FINAL CLINICAL IMPRESSION(S) / ED DIAGNOSES  Final diagnoses:  Weakness of left lower extremity     Lavonia Drafts, MD 05/05/15 [place], MD 05/05/15 1356

## 2015-05-05 NOTE — ED Notes (Signed)
Patient transported to MRI 

## 2015-05-05 NOTE — H&P (Signed)
PCP:   [name], MD   Chief Complaint:  Severe back pain  HPI: 69 yo male h/o recent restenting of his LAD with DES on plavix in April 2016, dm poorly controlled, parkinsons with worsening severe lower back pain for the last week.  He has been having issues with his back since April also.  Has had 3 steroid injections, oral predisone.  Problem has persisted, sometimes gets better to where the pain is tolerable, but the last several days have been unbearable despite his home pain meds.  Denies any fevers.  He has left leg weakness associated with his back pain.  No saddle anesthesia or urinary/bowel incontinence.  He has been walking with a walker, but unable to walk the last day or so due to severity of pain.  Mri today shows no change from previous imaging and shows no impingement of nerve/spine.  He has seen neurosurgery dr Hal Neer who did his cervical fusion years ago who wanted to obtain a myelogram for which patient has to be off his plavix for, however cardiology has recommended not to stop his plavix due to his recent restenting of his LAD.  We are asked to admit pt for intractable back pain.  Review of Systems:  Positive and negative as per HPI otherwise all other systems are negative  Past Medical History: Past Medical History  Diagnosis Date  . Shingles   . Diabetes mellitus   . Hyperlipidemia   . Broken leg     left...s/p pins  . Lower leg pain     chronic ,left leg  . Acute angina   . Depression   . CAD (coronary artery disease)     s/p multiple PCI with stent RCA,LAD and obtuse marginal,followed @ [facility] on the accord study.., cath 02/2012 90% D1, s/p drug-eluting stent Dr. Fletcher [name]  . Falls 09/2011  . Dizziness   . Shortness of breath   . Headache(784.0)   . Hypertension     dr Jerilynn Mages Audelia Acton     labauer in [place]  . Stroke   . Bell's palsy   . Parkinson disease   . Neuropathy    Past Surgical History  Procedure Laterality Date  . Appendectomy    .  Cholecystectomy    . Left leg surgery    . Left foot surgery    . Drainage port in left testicle    . Cardiac catheterization  02/2012  . Back surgery    . Anterior cervical decomp/discectomy fusion N/A 04/30/2013    Procedure: ANTERIOR CERVICAL DECOMPRESSION/DISCECTOMY FUSION 2 LEVELS;  Surgeon: Faythe Ghee, MD;  Location: [facility];  Service: Neurosurgery;  Laterality: N/A;  Cervical four-five, Cervical six-seven anterior cervical decompression fusion with trabecular metal cage and plate  . Left heart catheterization with coronary angiogram N/A 02/23/2015    Procedure: LEFT HEART CATHETERIZATION WITH CORONARY ANGIOGRAM;  Surgeon: [name], MD;  Location: [facility] CATH LAB;  Service: Cardiovascular;  Laterality: N/A;    Medications: Prior to Admission medications   Medication Sig Start Date End Date Taking? Authorizing Provider  acetaminophen (TYLENOL) 325 MG tablet Take 2 tablets (650 mg total) by mouth every 4 (four) hours as needed for headache or mild pain. 02/24/15  Yes Luke K Kilroy, PA-C  amLODipine (NORVASC) 5 MG tablet Take 1 tablet (5 mg total) by mouth daily. Patient taking differently: Take 10 mg by mouth daily.  02/16/15  Yes Jackolyn Confer, MD  aspirin EC 81 MG tablet Take 81 mg  by mouth 3 (three) times daily.   Yes Historical Provider, MD  carbidopa-levodopa (SINEMET IR) 25-100 MG per tablet Take 2 tablets by mouth 3 (three) times daily. Patient taking differently: Take 1 tablet by mouth 3 (three) times daily.  02/16/15  Yes Jackolyn Confer, MD  carvedilol (COREG) 25 MG tablet TAKE ONE TABLET BY MOUTH TWICE DAILY 03/31/15  Yes Jackolyn Confer, MD  clopidogrel (PLAVIX) 75 MG tablet Take 1 tablet (75 mg total) by mouth daily with breakfast. 02/16/15  Yes Jackolyn Confer, MD  cyanocobalamin (,VITAMIN B-12,) 1000 MCG/ML injection Inject 1,000 mcg into the muscle every 30 (thirty) days.    Yes Historical Provider, MD  DULoxetine (CYMBALTA) 60 MG capsule TAKE 1 CAPSULE DAILY  02/16/15  Yes Jackolyn Confer, MD  furosemide (LASIX) 20 MG tablet Take 1 tablet (20 mg total) by mouth daily. One tablet daily for two days 04/25/15  Yes [name], MD  HYDROcodone-acetaminophen (NORCO/VICODIN) 5-325 MG per tablet Take 1-2 tablets by mouth every 8 (eight) hours as needed. Patient taking differently: Take 1-2 tablets by mouth every 8 (eight) hours as needed for moderate pain.  02/22/15  Yes Jackolyn Confer, MD  insulin regular human CONCENTRATED (HUMULIN R U-500 KWIKPEN) 500 UNIT/ML SOLN injection Inject 90 units before each of the 3 meals, under skin, 30 min before meals Patient taking differently: Inject 20-30 Units into the skin 3 (three) times daily with meals. Takes 20 units in am, 20 units at lunch and 30 units in evening IF CBG BOTTOMS OUT, THEN REDUCING EACH DOSE BY 5 UNITS UNTIL CBG IS NORMAL RANGE 04/05/15  Yes Philemon Kingdom, MD  lisinopril-hydrochlorothiazide (PRINZIDE,ZESTORETIC) 20-12.5 MG per tablet Take 1 tablet by mouth 2 (two) times daily. 10/27/14  Yes Jackolyn Confer, MD  metFORMIN (GLUCOPHAGE) 1000 MG tablet TAKE ONE TABLET BY MOUTH TWICE DAILY 02/26/15  Yes Doreene Burke Kilroy, PA-C  pantoprazole (PROTONIX) 40 MG tablet Take 1 tablet (40 mg total) by mouth 2 (two) times daily. 10/28/14  Yes Jackolyn Confer, MD  pregabalin (LYRICA) 150 MG capsule Take 1 capsule (150 mg total) by mouth 2 (two) times daily. 04/19/15  Yes Jackolyn Confer, MD  simvastatin (ZOCOR) 20 MG tablet Take 1 tablet (20 mg total) by mouth at bedtime. 03/08/15  Yes [name], MD  spironolactone (ALDACTONE) 25 MG tablet Take 2 tablets (50 mg total) by mouth daily. Patient taking differently: Take 25 mg by mouth 2 (two) times daily.  04/29/15  Yes [name], MD  tiZANidine (ZANAFLEX) 4 MG tablet Take 1 tablet (4 mg total) by mouth every 8 (eight) hours as needed. for pain 03/11/15  Yes Rubbie Battiest, NP  gabapentin (NEURONTIN) 300 MG capsule Take 300 mg by mouth at bedtime.     Historical Provider, MD  glucose blood (FREESTYLE TEST STRIPS) test strip Use to check blood sugar up to three times daily 10/27/14   Jackolyn Confer, MD  Insulin Glargine (TOUJEO SOLOSTAR) 300 UNIT/ML SOPN Inject 55 Units into the skin 2 (two) times daily at 8 am and 10 pm. Patient taking differently: Inject 70 Units into the skin 2 (two) times daily at 8 am and 10 pm.  01/28/15   Philemon Kingdom, MD  insulin lispro (HUMALOG) 100 UNIT/ML injection Inject 0.5-0.55 mLs (50-55 Units total) into the skin 3 (three) times daily before meals. 04/04/15   Philemon Kingdom, MD  nitroGLYCERIN (NITROSTAT) 0.4 MG SL tablet Place 1 tablet (0.4 mg  total) under the tongue every 5 (five) minutes as needed for chest pain. 05/25/13   Jackolyn Confer, MD  predniSONE (DELTASONE) 20 MG tablet Take 1 tablet (20 mg total) by mouth daily with breakfast. Patient not taking: Reported on 05/05/2015 04/04/15   Jackolyn Confer, MD    Allergies:   Allergies  Allergen Reactions  . Codeine Nausea Only    Derivatives -Nausea.  . Tramadol Other (See Comments)    unknown  . Trazodone And Nefazodone Nausea Only    Social History:  reports that he quit smoking about 43 years ago. He has never used smokeless tobacco. He reports that he does not drink alcohol or use illicit drugs.  Family History: Family History  Problem Relation Age of Onset  . Cancer Mother     BREAST  . Heart disease Mother     STENT  . Cancer Father     THROAT  . Heart disease Sister     STENT; HTN  . Heart attack Brother   . Hypertension Brother   . Hypertension Sister     Physical Exam: Filed Vitals:   05/05/15 1630 05/05/15 1645 05/05/15 1807 05/05/15 1922  BP: 161/65 165/65 179/81 176/77  Pulse: 74 73 75 84  Temp:      TempSrc:      Resp:    18  SpO2: 97% 97% 97% 96%   General appearance: alert, cooperative and no distress Head: Normocephalic, without obvious abnormality, atraumatic Eyes: negative Nose: Nares normal. Septum  midline. Mucosa normal. No drainage or sinus tenderness. Neck: no JVD and supple, symmetrical, trachea midline Lungs: clear to auscultation bilaterally Heart: regular rate and rhythm, S1, S2 normal, no murmur, click, rub or gallop Abdomen: soft, non-tender; bowel sounds normal; no masses,  no organomegaly Extremities: extremities normal, atraumatic, no cyanosis or edema Pulses: 2+ and symmetric Skin: Skin color, texture, turgor normal. No rashes or lesions Neurologic: Grossly normal x 3/5 strenght in lle    Labs on Admission:   Recent Labs  05/05/15 1657  NA 138  K 3.9  CL 101  CO2 21*  GLUCOSE 286*  BUN 13  CREATININE 1.11  CALCIUM 8.8*    Recent Labs  05/05/15 1657  WBC 11.0*  NEUTROABS 8.0*  HGB 14.4  HCT 43.4  MCV 76.5*  PLT 191   Radiological Exams on Admission: Mr Lumbar Spine Wo Contrast  05/05/2015   CLINICAL DATA:  Worsening left lower extremity weakness.  EXAM: MRI LUMBAR SPINE WITHOUT CONTRAST  TECHNIQUE: Multiplanar, multisequence MR imaging of the lumbar spine was performed. No intravenous contrast was administered.  COMPARISON:  03/01/2015  FINDINGS: Vertebral alignment is within normal limits. Prior vertebral augmentation is again noted at T12. Lumbar vertebral body heights are preserved. No vertebral marrow edema is seen. Vertebral body heights are relatively well preserved. Conus medullaris is normal in signal and terminates at the inferior aspect of L1. Tiny right renal cyst is again noted.  L1-2 and L2-3:  Negative.  L3-4: Minimal disc bulging and mild facet hypertrophy without stenosis, unchanged.  L4-5: Mild disc bulging asymmetric to the left and mild facet hypertrophy result in minimal left lateral recess narrowing, unchanged. No spinal canal or neural foraminal stenosis.  L5-S1: Shallow left foraminal disc protrusion and mild facet hypertrophy without stenosis, unchanged.  IMPRESSION: Mild lumbar disc degeneration and facet hypertrophy without interval  change. No clear neural impingement.   Electronically Signed   By: Logan Bores   On: 05/05/2015 19:13  Assessment/Plan  69 yo male with severe lower back pain with mild disc bulging lumbar spine with recent DES stent to LAD in April 16  Principal Problem:   Intractable back pain-  Provide iv dilaudid.  Will need to call his neurosurgeon in the am dr Hal Neer but not sure patient will be able to come off his plavix for any intervention unless emergent.  Not emergent at this time.  On norco chronically at home, consider changing to percocet at discharge.  Iv dilaudid helping him some.  Cont muscle relaxer at night, this helps him a lot.   Active Problems:   CAD (coronary artery disease)   Hypertension   DM (diabetes mellitus), type 2 with neurological complications-  Hold metformin, place on ssi   Gait abnormality   Obesity-BMI 35   Coronary stent occlusion s/p DES restenting of LAD 4/16-  Continue his plavix.  Would not stop his plavix unless approved by cardiology, pt high risk for reoccluding i would suspect.  Pt and family understand this but are at the point where they want his back fixed.  obs on med surg.  Full code.    Mattheo Swindle A 05/05/2015, 8:17 PM

## 2015-05-06 ENCOUNTER — Encounter (HOSPITAL_COMMUNITY): Payer: Self-pay | Admitting: Cardiology

## 2015-05-06 ENCOUNTER — Observation Stay (HOSPITAL_COMMUNITY): Payer: Medicare Other

## 2015-05-06 DIAGNOSIS — M25552 Pain in left hip: Secondary | ICD-10-CM | POA: Diagnosis not present

## 2015-05-06 DIAGNOSIS — I25110 Atherosclerotic heart disease of native coronary artery with unstable angina pectoris: Secondary | ICD-10-CM

## 2015-05-06 DIAGNOSIS — M549 Dorsalgia, unspecified: Secondary | ICD-10-CM | POA: Diagnosis not present

## 2015-05-06 DIAGNOSIS — T82897S Other specified complication of cardiac prosthetic devices, implants and grafts, sequela: Secondary | ICD-10-CM | POA: Diagnosis not present

## 2015-05-06 DIAGNOSIS — I2510 Atherosclerotic heart disease of native coronary artery without angina pectoris: Secondary | ICD-10-CM | POA: Diagnosis not present

## 2015-05-06 DIAGNOSIS — R29898 Other symptoms and signs involving the musculoskeletal system: Secondary | ICD-10-CM | POA: Diagnosis not present

## 2015-05-06 DIAGNOSIS — R269 Unspecified abnormalities of gait and mobility: Secondary | ICD-10-CM

## 2015-05-06 DIAGNOSIS — G8929 Other chronic pain: Secondary | ICD-10-CM | POA: Diagnosis not present

## 2015-05-06 DIAGNOSIS — I150 Renovascular hypertension: Secondary | ICD-10-CM

## 2015-05-06 DIAGNOSIS — E1140 Type 2 diabetes mellitus with diabetic neuropathy, unspecified: Secondary | ICD-10-CM

## 2015-05-06 DIAGNOSIS — M79605 Pain in left leg: Secondary | ICD-10-CM | POA: Diagnosis not present

## 2015-05-06 DIAGNOSIS — I10 Essential (primary) hypertension: Secondary | ICD-10-CM

## 2015-05-06 DIAGNOSIS — R102 Pelvic and perineal pain: Secondary | ICD-10-CM | POA: Diagnosis not present

## 2015-05-06 LAB — BASIC METABOLIC PANEL
Anion gap: 10 (ref 5–15)
BUN: 16 mg/dL (ref 6–20)
CO2: 24 mmol/L (ref 22–32)
Calcium: 8.5 mg/dL — ABNORMAL LOW (ref 8.9–10.3)
Chloride: 101 mmol/L (ref 101–111)
Creatinine, Ser: 1.42 mg/dL — ABNORMAL HIGH (ref 0.61–1.24)
GFR calc Af Amer: 56 mL/min — ABNORMAL LOW (ref >60)
GFR, EST NON AFRICAN AMERICAN: 49 mL/min — AB (ref >60)
GLUCOSE: 451 mg/dL — AB (ref 65–99)
POTASSIUM: 4.7 mmol/L (ref 3.5–5.1)
Sodium: 135 mmol/L (ref 135–145)

## 2015-05-06 LAB — GLUCOSE, CAPILLARY
GLUCOSE-CAPILLARY: 407 mg/dL — AB (ref 65–99)
GLUCOSE-CAPILLARY: 376 mg/dL — AB (ref 65–99)
Glucose-Capillary: 340 mg/dL — ABNORMAL HIGH (ref 65–99)
Glucose-Capillary: 426 mg/dL — ABNORMAL HIGH (ref 65–99)

## 2015-05-06 LAB — CBC
HEMATOCRIT: 38.6 % — AB (ref 39.0–52.0)
Hemoglobin: 12.8 g/dL — ABNORMAL LOW (ref 13.0–17.0)
MCH: 25.4 pg — AB (ref 26.0–34.0)
MCHC: 33.2 g/dL (ref 30.0–36.0)
MCV: 76.6 fL — AB (ref 78.0–100.0)
Platelets: 175 10*3/uL (ref 150–400)
RBC: 5.04 MIL/uL (ref 4.22–5.81)
RDW: 17.9 % — ABNORMAL HIGH (ref 11.5–15.5)
WBC: 9.5 10*3/uL (ref 4.0–10.5)

## 2015-05-06 LAB — PLATELET INHIBITION P2Y12: Platelet Function  P2Y12: 208 [PRU] (ref 194–418)

## 2015-05-06 MED ORDER — HYDRALAZINE HCL 20 MG/ML IJ SOLN
10.0000 mg | Freq: Four times a day (QID) | INTRAMUSCULAR | Status: DC | PRN
  Administered 2015-05-06: 10 mg via INTRAVENOUS
  Filled 2015-05-06: qty 1

## 2015-05-06 MED ORDER — GABAPENTIN 300 MG PO CAPS
300.0000 mg | ORAL_CAPSULE | Freq: Two times a day (BID) | ORAL | Status: DC
  Administered 2015-05-06: 300 mg via ORAL
  Filled 2015-05-06 (×2): qty 1

## 2015-05-06 MED ORDER — INSULIN GLARGINE 100 UNIT/ML ~~LOC~~ SOLN
55.0000 [IU] | Freq: Two times a day (BID) | SUBCUTANEOUS | Status: DC
  Administered 2015-05-06 – 2015-05-08 (×5): 55 [IU] via SUBCUTANEOUS
  Filled 2015-05-06 (×6): qty 0.55

## 2015-05-06 MED ORDER — CARBIDOPA-LEVODOPA 25-100 MG PO TABS
1.0000 | ORAL_TABLET | Freq: Three times a day (TID) | ORAL | Status: DC
  Administered 2015-05-06 – 2015-05-09 (×10): 1 via ORAL
  Filled 2015-05-06 (×10): qty 1

## 2015-05-06 MED ORDER — INSULIN ASPART 100 UNIT/ML ~~LOC~~ SOLN
0.0000 [IU] | Freq: Every day | SUBCUTANEOUS | Status: DC
  Administered 2015-05-06 – 2015-05-07 (×2): 5 [IU] via SUBCUTANEOUS
  Administered 2015-05-08: 4 [IU] via SUBCUTANEOUS

## 2015-05-06 MED ORDER — DIAZEPAM 5 MG/ML IJ SOLN
2.5000 mg | Freq: Once | INTRAMUSCULAR | Status: AC
  Administered 2015-05-07: 2.5 mg via INTRAVENOUS
  Filled 2015-05-06: qty 2

## 2015-05-06 MED ORDER — INSULIN ASPART 100 UNIT/ML ~~LOC~~ SOLN
8.0000 [IU] | Freq: Once | SUBCUTANEOUS | Status: AC
Start: 2015-05-06 — End: 2015-05-06
  Administered 2015-05-06: 8 [IU] via SUBCUTANEOUS

## 2015-05-06 MED ORDER — INSULIN ASPART 100 UNIT/ML ~~LOC~~ SOLN
4.0000 [IU] | Freq: Three times a day (TID) | SUBCUTANEOUS | Status: DC
Start: 2015-05-06 — End: 2015-05-07
  Administered 2015-05-07 (×2): 4 [IU] via SUBCUTANEOUS

## 2015-05-06 MED ORDER — INSULIN ASPART 100 UNIT/ML ~~LOC~~ SOLN
0.0000 [IU] | Freq: Three times a day (TID) | SUBCUTANEOUS | Status: DC
  Administered 2015-05-06 – 2015-05-08 (×5): 20 [IU] via SUBCUTANEOUS
  Administered 2015-05-08: 15 [IU] via SUBCUTANEOUS
  Administered 2015-05-09: 20 [IU] via SUBCUTANEOUS
  Administered 2015-05-09: 11 [IU] via SUBCUTANEOUS
  Administered 2015-05-09: 20 [IU] via SUBCUTANEOUS

## 2015-05-06 NOTE — Consult Note (Signed)
Reason for Consult: Leg pain and weakness. Referring Physician: Dr Rosalie Doctor is an 69 y.o. male.  HPI: The patient is a 69 year old white male who has had a T12 kyphoplasty by Dr. Raliegh Ip left in [place] years ago. Patient has also had a anterior cervical discectomy, fusion and plating by Dr. Hal Neer a few years ago.  The patient states that he can having pain in his back and left leg immediately after getting a heart catheterization in April 2013. Patient has seen Dr. Hal Neer regarding this and was worked up with a lumbar MRI which was fairly unremarkable. There was some discussion about getting a lumbar myelogram/CT scan but this was on hold because the patient is on Plavix.  The patient was admitted for intractable back pain. A repeat lumbar MRI was again unremarkable. A neurosurgical consultation has been requested. Dr. Hal Neer is out of town.  Presently the patient is accompanied by his wife he relates the above information. He tells me his back and leg pain began immediately after undergoing a cardiac catheterization. The catheter was inserted through his wrist, not his groin. The patient has noted some swelling in his left leg and has had 2 lower extremity ultrasounds which were negative for DVTs. He describes pain beginning in his left buttocks and remained on his left leg. The pain is not well localized. He feels his left leg is weak. He does not have radicular symptoms on the right.  Past Medical History  Diagnosis Date  . Shingles   . Diabetes mellitus   . Hyperlipidemia   . Broken leg     left...s/p pins  . Lower leg pain     chronic ,left leg  . Acute angina   . Depression   . CAD (coronary artery disease)     s/p multiple PCI with stent RCA,LAD and obtuse marginal,followed @ [facility] on the accord study.., cath 02/2012 90% D1, s/p drug-eluting stent Dr. Fletcher [name]  . Falls 09/2011  . Dizziness   . Shortness of breath   . Headache(784.0)   . Hypertension     dr Jerilynn Mages Audelia Acton      [facility]auer in [place]  . Stroke   . Bell's palsy   . Parkinson disease   . Neuropathy     Past Surgical History  Procedure Laterality Date  . Appendectomy    . Cholecystectomy    . Left leg surgery    . Left foot surgery    . Drainage port in left testicle    . Cardiac catheterization  02/2012  . Back surgery    . Anterior cervical decomp/discectomy fusion N/A 04/30/2013    Procedure: ANTERIOR CERVICAL DECOMPRESSION/DISCECTOMY FUSION 2 LEVELS;  Surgeon: Faythe Ghee, MD;  Location: [facility];  Service: Neurosurgery;  Laterality: N/A;  Cervical four-five, Cervical six-seven anterior cervical decompression fusion with trabecular metal cage and plate  . Left heart catheterization with coronary angiogram N/A 02/23/2015    Procedure: LEFT HEART CATHETERIZATION WITH CORONARY ANGIOGRAM;  Surgeon: [name], MD;  Location: [facility];  Service: Cardiovascular;  Laterality: N/A;    Family History  Problem Relation Age of Onset  . Cancer Mother     BREAST  . Heart disease Mother     STENT  . Cancer Father     THROAT  . Heart disease Sister     STENT; HTN  . Heart attack Brother   . Hypertension Brother   . Hypertension Sister  Social History:  reports that he quit smoking about 43 years ago. He has never used smokeless tobacco. He reports that he does not drink alcohol or use illicit drugs.  Allergies:  Allergies  Allergen Reactions  . Codeine Nausea Only    Derivatives -Nausea.  . Tramadol Other (See Comments)    unknown  . Trazodone And Nefazodone Nausea Only    Medications:  I have reviewed the patient's current medications. Prior to Admission:  Prescriptions prior to admission  Medication Sig Dispense Refill Last Dose  . acetaminophen (TYLENOL) 325 MG tablet Take 2 tablets (650 mg total) by mouth every 4 (four) hours as needed for headache or mild pain.   Past Week at Unknown time  . amLODipine (NORVASC) 5 MG tablet Take 1 tablet (5 mg total) by mouth  daily. (Patient taking differently: Take 10 mg by mouth daily. ) 90 tablet 3 05/05/2015 at Unknown time  . aspirin EC 81 MG tablet Take 81 mg by mouth 3 (three) times daily.   05/05/2015 at Unknown time  . carbidopa-levodopa (SINEMET IR) 25-100 MG per tablet Take 2 tablets by mouth 3 (three) times daily. (Patient taking differently: Take 1 tablet by mouth 3 (three) times daily. ) 270 tablet 2 05/05/2015 at Unknown time  . carvedilol (COREG) 25 MG tablet TAKE ONE TABLET BY MOUTH TWICE DAILY 180 tablet 1 05/05/2015 at 0800  . clopidogrel (PLAVIX) 75 MG tablet Take 1 tablet (75 mg total) by mouth daily with breakfast. 90 tablet 1 05/05/2015 at Unknown time  . cyanocobalamin (,VITAMIN B-12,) 1000 MCG/ML injection Inject 1,000 mcg into the muscle every 30 (thirty) days.    03/2015  . DULoxetine (CYMBALTA) 60 MG capsule TAKE 1 CAPSULE DAILY 90 capsule 3 05/05/2015 at Unknown time  . furosemide (LASIX) 20 MG tablet Take 1 tablet (20 mg total) by mouth daily. One tablet daily for two days 3 tablet 0 05/05/2015 at Unknown time  . HYDROcodone-acetaminophen (NORCO/VICODIN) 5-325 MG per tablet Take 1-2 tablets by mouth every 8 (eight) hours as needed. (Patient taking differently: Take 1-2 tablets by mouth every 8 (eight) hours as needed for moderate pain. ) 90 tablet 0 05/05/2015 at Unknown time  . insulin regular human CONCENTRATED (HUMULIN R U-500 KWIKPEN) 500 UNIT/ML SOLN injection Inject 90 units before each of the 3 meals, under skin, 30 min before meals (Patient taking differently: Inject 20-30 Units into the skin 3 (three) times daily with meals. Takes 20 units in am, 20 units at lunch and 30 units in evening IF CBG BOTTOMS OUT, THEN REDUCING EACH DOSE BY 5 UNITS UNTIL CBG IS NORMAL RANGE) 54 mL 3 05/05/2015 at am  . lisinopril-hydrochlorothiazide (PRINZIDE,ZESTORETIC) 20-12.5 MG per tablet Take 1 tablet by mouth 2 (two) times daily. 180 tablet 1 05/05/2015 at Unknown time  . metFORMIN (GLUCOPHAGE) 1000 MG tablet TAKE  ONE TABLET BY MOUTH TWICE DAILY 180 tablet 1 05/05/2015 at Unknown time  . pantoprazole (PROTONIX) 40 MG tablet Take 1 tablet (40 mg total) by mouth 2 (two) times daily. 180 tablet 3 05/05/2015 at Unknown time  . pregabalin (LYRICA) 150 MG capsule Take 1 capsule (150 mg total) by mouth 2 (two) times daily. 180 capsule 1 05/05/2015 at am  . simvastatin (ZOCOR) 20 MG tablet Take 1 tablet (20 mg total) by mouth at bedtime. 90 tablet 3 05/04/2015 at Unknown time  . spironolactone (ALDACTONE) 25 MG tablet Take 2 tablets (50 mg total) by mouth daily. (Patient taking differently: Take 25 mg by  mouth 2 (two) times daily. ) 60 tablet 5 05/05/2015 at Unknown time  . tiZANidine (ZANAFLEX) 4 MG tablet Take 1 tablet (4 mg total) by mouth every 8 (eight) hours as needed. for pain 63 tablet 0 05/04/2015 at Unknown time  . gabapentin (NEURONTIN) 300 MG capsule Take 300 mg by mouth at bedtime.   not started  . glucose blood (FREESTYLE TEST STRIPS) test strip Use to check blood sugar up to three times daily 300 each 1 Taking  . Insulin Glargine (TOUJEO SOLOSTAR) 300 UNIT/ML SOPN Inject 55 Units into the skin 2 (two) times daily at 8 am and 10 pm. (Patient taking differently: Inject 70 Units into the skin 2 (two) times daily at 8 am and 10 pm. ) 27 pen 1 Taking  . insulin lispro (HUMALOG) 100 UNIT/ML injection Inject 0.5-0.55 mLs (50-55 Units total) into the skin 3 (three) times daily before meals. 50 mL 0 Taking  . nitroGLYCERIN (NITROSTAT) 0.4 MG SL tablet Place 1 tablet (0.4 mg total) under the tongue every 5 (five) minutes as needed for chest pain. 30 tablet 6 not used  . predniSONE (DELTASONE) 20 MG tablet Take 1 tablet (20 mg total) by mouth daily with breakfast. (Patient not taking: Reported on 05/05/2015) 30 tablet 0 Completed Course at past week   Scheduled: . amLODipine  5 mg Oral Daily  . aspirin EC  81 mg Oral TID  . carbidopa-levodopa  1 tablet Oral TID  . carvedilol  25 mg Oral BID  . furosemide  20 mg Oral  Daily  . gabapentin  300 mg Oral BID  . hydrochlorothiazide  12.5 mg Oral Daily   And  . lisinopril  20 mg Oral Daily  . insulin aspart  0-20 Units Subcutaneous TID WC  . insulin aspart  0-5 Units Subcutaneous QHS  . insulin aspart  4 Units Subcutaneous TID WC  . insulin glargine  55 Units Subcutaneous BID  . pantoprazole  40 mg Oral BID  . predniSONE  20 mg Oral Q breakfast  . pregabalin  150 mg Oral BID  . simvastatin  20 mg Oral QHS  . sodium chloride  3 mL Intravenous Q12H  . spironolactone  50 mg Oral Daily   Continuous:  YOV:ZCHYIF chloride, acetaminophen, HYDROcodone-acetaminophen, HYDROmorphone (DILAUDID) injection, nitroGLYCERIN, ondansetron **OR** ondansetron (ZOFRAN) IV, sodium chloride, tiZANidine Anti-infectives    None       Results for orders placed or performed during the hospital encounter of 05/05/15 (from the past 48 hour(s))  CBC with Differential/Platelet     Status: Abnormal   Collection Time: 05/05/15  4:57 PM  Result Value Ref Range   WBC 11.0 (H) 4.0 - 10.5 K/uL   RBC 5.67 4.22 - 5.81 MIL/uL   Hemoglobin 14.4 13.0 - 17.0 g/dL   HCT 43.4 39.0 - 52.0 %   MCV 76.5 (L) 78.0 - 100.0 fL   MCH 25.4 (L) 26.0 - 34.0 pg   MCHC 33.2 30.0 - 36.0 g/dL   RDW 17.9 (H) 11.5 - 15.5 %   Platelets 191 150 - 400 K/uL   Neutrophils Relative % 72 43 - 77 %   Neutro Abs 8.0 (H) 1.7 - 7.7 K/uL   Lymphocytes Relative 18 12 - 46 %   Lymphs Abs 2.0 0.7 - 4.0 K/uL   Monocytes Relative 7 3 - 12 %   Monocytes Absolute 0.7 0.1 - 1.0 K/uL   Eosinophils Relative 3 0 - 5 %   Eosinophils Absolute 0.3 0.0 -  0.7 K/uL   Basophils Relative 0 0 - 1 %   Basophils Absolute 0.0 0.0 - 0.1 K/uL  Basic metabolic panel     Status: Abnormal   Collection Time: 05/05/15  4:57 PM  Result Value Ref Range   Sodium 138 135 - 145 mmol/L   Potassium 3.9 3.5 - 5.1 mmol/L   Chloride 101 101 - 111 mmol/L   CO2 21 (L) 22 - 32 mmol/L   Glucose, Bld 286 (H) 65 - 99 mg/dL   BUN 13 6 - 20 mg/dL    Creatinine, Ser 1.11 0.61 - 1.24 mg/dL   Calcium 8.8 (L) 8.9 - 10.3 mg/dL   GFR calc non Af Amer >60 >60 mL/min   GFR calc Af Amer >60 >60 mL/min    Comment: (NOTE) The eGFR has been calculated using the CKD EPI equation. This calculation has not been validated in all clinical situations. eGFR's persistently <60 mL/min signify possible Chronic Kidney Disease.    Anion gap 16 (H) 5 - 15  Urinalysis, Routine w reflex microscopic (not at [facility])     Status: Abnormal   Collection Time: 05/05/15  7:20 PM  Result Value Ref Range   Color, Urine AMBER (A) YELLOW    Comment: BIOCHEMICALS MAY BE AFFECTED BY COLOR   APPearance CLEAR CLEAR   Specific Gravity, Urine 1.016 1.005 - 1.030   pH 5.5 5.0 - 8.0   Glucose, UA 250 (A) NEGATIVE mg/dL   Hgb urine dipstick NEGATIVE NEGATIVE   Bilirubin Urine NEGATIVE NEGATIVE   Ketones, ur 15 (A) NEGATIVE mg/dL   Protein, ur >300 (A) NEGATIVE mg/dL   Urobilinogen, UA 1.0 0.0 - 1.0 mg/dL   Nitrite NEGATIVE NEGATIVE   Leukocytes, UA NEGATIVE NEGATIVE  Urine microscopic-add on     Status: Abnormal   Collection Time: 05/05/15  7:20 PM  Result Value Ref Range   Squamous Epithelial / LPF FEW (A) RARE   WBC, UA 0-2 <3 WBC/hpf   Casts HYALINE CASTS (A) NEGATIVE  Glucose, capillary     Status: Abnormal   Collection Time: 05/05/15 10:57 PM  Result Value Ref Range   Glucose-Capillary 374 (H) 65 - 99 mg/dL   Comment 1 Notify RN   Basic metabolic panel     Status: Abnormal   Collection Time: 05/06/15  3:18 AM  Result Value Ref Range   Sodium 135 135 - 145 mmol/L   Potassium 4.7 3.5 - 5.1 mmol/L    Comment: DELTA CHECK NOTED NO VISIBLE HEMOLYSIS    Chloride 101 101 - 111 mmol/L   CO2 24 22 - 32 mmol/L   Glucose, Bld 451 (H) 65 - 99 mg/dL   BUN 16 6 - 20 mg/dL   Creatinine, Ser 1.42 (H) 0.61 - 1.24 mg/dL   Calcium 8.5 (L) 8.9 - 10.3 mg/dL   GFR calc non Af Amer 49 (L) >60 mL/min   GFR calc Af Amer 56 (L) >60 mL/min    Comment: (NOTE) The eGFR has been  calculated using the CKD EPI equation. This calculation has not been validated in all clinical situations. eGFR's persistently <60 mL/min signify possible Chronic Kidney Disease.    Anion gap 10 5 - 15  CBC     Status: Abnormal   Collection Time: 05/06/15  3:18 AM  Result Value Ref Range   WBC 9.5 4.0 - 10.5 K/uL   RBC 5.04 4.22 - 5.81 MIL/uL   Hemoglobin 12.8 (L) 13.0 - 17.0 g/dL   HCT 38.6 (  L) 39.0 - 52.0 %   MCV 76.6 (L) 78.0 - 100.0 fL   MCH 25.4 (L) 26.0 - 34.0 pg   MCHC 33.2 30.0 - 36.0 g/dL   RDW 17.9 (H) 11.5 - 15.5 %   Platelets 175 150 - 400 K/uL  Glucose, capillary     Status: Abnormal   Collection Time: 05/06/15  7:45 AM  Result Value Ref Range   Glucose-Capillary 340 (H) 65 - 99 mg/dL  Glucose, capillary     Status: Abnormal   Collection Time: 05/06/15 11:56 AM  Result Value Ref Range   Glucose-Capillary 407 (H) 65 - 99 mg/dL  Platelet inhibition p2y12     Status: None   Collection Time: 05/06/15  1:05 PM  Result Value Ref Range   Platelet Function  P2Y12 208 194 - 418 PRU    Comment:        The literature has shown a direct correlation of PRU values over 230 with higher risks of thrombotic events.  Lower PRU values are associated with platelet inhibition.     Mr Lumbar Spine Wo Contrast  05/05/2015   CLINICAL DATA:  Worsening left lower extremity weakness.  EXAM: MRI LUMBAR SPINE WITHOUT CONTRAST  TECHNIQUE: Multiplanar, multisequence MR imaging of the lumbar spine was performed. No intravenous contrast was administered.  COMPARISON:  03/01/2015  FINDINGS: Vertebral alignment is within normal limits. Prior vertebral augmentation is again noted at T12. Lumbar vertebral body heights are preserved. No vertebral marrow edema is seen. Vertebral body heights are relatively well preserved. Conus medullaris is normal in signal and terminates at the inferior aspect of L1. Tiny right renal cyst is again noted.  L1-2 and L2-3:  Negative.  L3-4: Minimal disc bulging and  mild facet hypertrophy without stenosis, unchanged.  L4-5: Mild disc bulging asymmetric to the left and mild facet hypertrophy result in minimal left lateral recess narrowing, unchanged. No spinal canal or neural foraminal stenosis.  L5-S1: Shallow left foraminal disc protrusion and mild facet hypertrophy without stenosis, unchanged.  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 No clear neural impingement.   Electronically Signed   By: Logan Bores   On: 05/05/2015 19:13    ROS Blood pressure 148/70, pulse 72, temperature 98 F (36.7 C), temperature source Oral, resp. rate 18, height 6' (1.829 m), weight 117.935 kg (260 lb), SpO2 98 %. Physical Exam  General: An alert and pleasant obese 69 year old white male who complains of left leg pain.  HEENT: Normocephalic, atraumatic  Neck: The patient's right cervical incision is well-healed. There is no masses or deformities. He has limited range of motion.  Thorax: Symmetric  Abdomen: Obese and soft  Extremities the patient has left greater than right pedal edema. I cannot feel his pulses well and his left lower extremity.  Back exam: Fabers testing is positive on the left, straight leg raise testing is negative on the left.  Neurologic exam the patient is alert and oriented 3. Upper extremity strength is grossly normal. Strength in his right lower extremity is grossly normal. It is difficult to accurately assess the strength in his left lower extremity as gives way diffusely in easily. He has decreased  light touch sensation diffusely in his left lower extremity.  I have reviewed the patient's lumbar MRI performed yesterday [facility]. He has had a T12 kyphoplasty which looks good. He has some facet arthropathy at L5-S1. I don't see any significant neural compression, ruptured disc, stenosis, etc.  I.e. I see no explanation for his left leg pain on the lumbar MRI.  Assessment/Plan: Left  leg pain, weakness, swelling, etc.: I discussed the situation with the patient and his wife. I have told them I see no explanation for his left leg pain on his lumbar MRI. Perhaps he has a thoracic or cervical lesion using his pain and weakness, however I think this is unlikely. I would recommend we work him up further with a cervical and thoracic MRI to be complete. I am also going to work him up with a hip x-ray to rule out significant hip pathology given his positive Faber's test. I also recommend that he be worked up with a left lower extremity ultrasound to rule out DVT. If the MRIs and ultrasound are unremarkable perhaps we should have the vascular surgeon see him to rule out vascular claudication.  Graylen Noboa D 05/06/2015, 3:12 PM

## 2015-05-06 NOTE — Progress Notes (Signed)
Inpatient Diabetes Program Recommendations  AACE/ADA: New Consensus Statement on Inpatient Glycemic Control (2013)  Target Ranges:  Prepandial:   less than 140 mg/dL      Peak postprandial:   less than 180 mg/dL (1-2 hours)      Critically ill patients:  140 - 180 mg/dL   Consult received for uncontrolled diabetes mellitus.  Dr. Cruzita Lederer is his endocrinologist and he was last seen by her in May 2016.  Agree with current orders at this time.   Thank you  Raoul Pitch BSN, RN,CDE Inpatient Diabetes Coordinator [phone] (team pager)

## 2015-05-06 NOTE — Progress Notes (Signed)
[facility]                                                                              Patient Demographics  Steve Phillips, is a 69 y.o. male, DOB - 05/15/1946, CBS:[id number]  Admit date - 05/05/2015   Admitting Physician Phillips Grout, MD  Outpatient Primary MD for the patient is [name], MD  LOS -    Chief Complaint  Patient presents with  . Back Pain       Brief HPI   Patient is a 69 year old male with CAD, recent stenting of LAD with DES in April 2016, poorly controlled diabetes medicines, Parkinson's, presented with severe lower back pain for the last 1 week. Patient reported chronic back pain since April of this year. Patient had 3 steroidal injections, oral prednisone. Patient noted that the pain continued to get worse and has been unbearable for last several days despite the home pain medications. He denied any fevers, states that pain radiates to left leg and feels weaker associated with back pain. No saddle anesthesia or urinary or bowel incontinence. Patient also reported that he has been unable to walk in the last 2 days due to severity of pain. Patient had seen neurosurgery who did the cervical fusion surgery years ago who wanted to obtain a myelogram for which patient had to be off the Plavix however cardiology had recommended not to stop the Plavix due to recent stenting of the LAD. Patient was admitted for intractable back pain.   Assessment & Plan    Principal Problem:   Intractable back pain - Patient underwent MRI of the lumbar spine which showed mild lumbar disc degeneration and facet hypertrophy without interval change, no clear neural impingement - Continue Neurontin, Lyrica, pain control with IV Dilaudid, Norco, prednisone - Called neurosurgery consult, patient and his wife insistent on having neurosurgery for his back pain. Patient's wife feels that she is unable to take care of him at home with the current situation and his pain. I  called cardiology consult regarding the Plavix as patient recently had a LAD stent.  Active Problems:   CAD (coronary artery disease) - Continue aspirin, Plavix, amlodipine, lisinopril, HCTZ - Await neurosurgery recommendations, Dr. Arnoldo Morale to see patient today    Hypertension - Currently stable, continue amlodipine, Coreg, Lasix, lisinopril, HCTZ    DM (diabetes mellitus), type 2 with neurological complications: Uncontrolled - Obtain hemoglobin A1c, placed on Lantus, sliding scale insulin, meal coverage     Obesity-BMI 35 - Patient was counseled on diet and weight control   Family communication Discussed in detail in detail with the patient, all imaging results, lab results explained to the patieand his wife at the bedside   Disposition Plan:  not medically ready  TimeSpent in minutes 25 minutes  Procedures  MRI lumbar spine   Consults   Neurosurgery Cardiology  DVT Prophylaxis   SCD's  Medications  Scheduled Meds: . amLODipine  5 mg Oral Daily  . aspirin EC  81 mg Oral TID  . carbidopa-levodopa  1 tablet Oral TID  . carvedilol  25 mg Oral BID  . furosemide  20 mg  Oral Daily  . gabapentin  300 mg Oral BID  . hydrochlorothiazide  12.5 mg Oral Daily   And  . lisinopril  20 mg Oral Daily  . insulin aspart  0-20 Units Subcutaneous TID WC  . insulin aspart  0-5 Units Subcutaneous QHS  . insulin aspart  4 Units Subcutaneous TID WC  . insulin glargine  55 Units Subcutaneous BID  . pantoprazole  40 mg Oral BID  . predniSONE  20 mg Oral Q breakfast  . pregabalin  150 mg Oral BID  . simvastatin  20 mg Oral QHS  . sodium chloride  3 mL Intravenous Q12H  . spironolactone  50 mg Oral Daily   Continuous Infusions:  PRN Meds:.sodium chloride, acetaminophen, HYDROcodone-acetaminophen, HYDROmorphone (DILAUDID) injection, nitroGLYCERIN, ondansetron **OR** ondansetron (ZOFRAN) IV, sodium chloride, tiZANidine   Antibiotics   Anti-infectives    None        Subjective:    Steve Phillips was seen and examined today.  complaint of intractable pain in his left leg and back pain, frustrated with the whole situation, wants surgery to be done. Patient denies dizziness, chest pain, shortness of breath, abdominal pain, N/V/D/C, new weakness, numbess, tingling. No acute events overnight.    Objective:   Blood pressure 120/69, pulse 70, temperature 97.9 F (36.6 C), temperature source Oral, resp. rate 18, height 6' (1.829 m), weight 117.935 kg (260 lb), SpO2 94 %.  Wt Readings from Last 3 Encounters:  05/06/15 117.935 kg (260 lb)  04/05/15 115.667 kg (255 lb)  04/04/15 117.141 kg (258 lb 4 oz)     Intake/Output Summary (Last 24 hours) at 05/06/15 1354 Last data filed at 05/06/15 1336  Gross per 24 hour  Intake    360 ml  Output    200 ml  Net    160 ml    Exam  General: Alert and oriented x 3, NAD  HEENT:  PERRLA, EOMI, Anicteric Sclera, mucous membranes moist.   Neck: Supple, no JVD, no masses  CVS: S1 S2 auscultated, no rubs, murmurs or gallops. Regular rate and rhythm.  Respiratory: CTAB  Abdomen: Soft, nontender, nondistended, + bowel sounds  Ext: no cyanosis clubbing or edema  Neuro: AAOx3, Cr N's II- XII. Strength 5/5 upper and lower extremities bilaterally, able to lift up the left leg but complaining of pain   Skin: No rashes  Psych: Normal affect and demeanor, alert and oriented x3    Data Review   Micro Results No results found for this or any previous visit (from the past 240 hour(s)).  Radiology Reports Mr Lumbar Spine Wo Contrast  05/05/2015   CLINICAL DATA:  Worsening left lower extremity weakness.  EXAM: MRI LUMBAR SPINE WITHOUT CONTRAST  TECHNIQUE: Multiplanar, multisequence MR imaging of the lumbar spine was performed. No intravenous contrast was administered.  COMPARISON:  03/01/2015  FINDINGS: Vertebral alignment is within normal limits. Prior vertebral augmentation is again noted at T12. Lumbar vertebral body heights are  preserved. No vertebral marrow edema is seen. Vertebral body heights are relatively well preserved. Conus medullaris is normal in signal and terminates at the inferior aspect of L1. Tiny right renal cyst is again noted.  L1-2 and L2-3:  Negative.  L3-4: Minimal disc bulging and mild facet hypertrophy without stenosis, unchanged.  L4-5: Mild disc bulging asymmetric to the left and mild facet hypertrophy result in minimal left lateral recess narrowing, unchanged. No spinal canal or neural foraminal stenosis.  L5-S1: Shallow left foraminal disc protrusion and mild facet hypertrophy  without stenosis, unchanged.  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 No clear neural impingement.   Electronically Signed   By: Logan Bores   On: 05/05/2015 19:13    CBC  Recent Labs Lab 05/05/15 1657 05/06/15 0318  WBC 11.0* 9.5  HGB 14.4 12.8*  HCT 43.4 38.6*  PLT 191 175  MCV 76.5* 76.6*  MCH 25.4* 25.4*  MCHC 33.2 33.2  RDW 17.9* 17.9*  LYMPHSABS 2.0  --   MONOABS 0.7  --   EOSABS 0.3  --   BASOSABS 0.0  --     Chemistries   Recent Labs Lab 05/05/15 1657 05/06/15 0318  NA 138 135  K 3.9 4.7  CL 101 101  CO2 21* 24  GLUCOSE 286* 451*  BUN 13 16  CREATININE 1.11 1.42*  CALCIUM 8.8* 8.5*   ------------------------------------------------------------------------------------------------------------------ estimated creatinine clearance is 64.2 mL/min (by C-G formula based on Cr of 1.42). ------------------------------------------------------------------------------------------------------------------ No results for input(s): HGBA1C in the last 72 hours. ------------------------------------------------------------------------------------------------------------------ No results for input(s): CHOL, HDL, LDLCALC, TRIG, CHOLHDL, LDLDIRECT in the last 72  hours. ------------------------------------------------------------------------------------------------------------------ No results for input(s): TSH, T4TOTAL, T3FREE, THYROIDAB in the last 72 hours.  Invalid input(s): FREET3 ------------------------------------------------------------------------------------------------------------------ No results for input(s): VITAMINB12, FOLATE, FERRITIN, TIBC, IRON, RETICCTPCT in the last 72 hours.  Coagulation profile No results for input(s): INR, PROTIME in the last 168 hours.  No results for input(s): DDIMER in the last 72 hours.  Cardiac Enzymes No results for input(s): CKMB, TROPONINI, MYOGLOBIN in the last 168 hours.  Invalid input(s): CK ------------------------------------------------------------------------------------------------------------------ Invalid input(s): [place]  05/05/15 2257 05/06/15 0745 05/06/15 1156  GLUCAP 374* 340* 407*     Heidee Audi M.D. [facility] 05/06/2015, 1:54 PM  Pager: [phone]   Between 7am to 7pm - call Pager - [phone]  After 7pm go to [email or web address] - password [email or web address]  Call night coverage person covering after 7pm

## 2015-05-06 NOTE — Progress Notes (Signed)
Dr. Tana Coast informed of cbg of 407 at 46.

## 2015-05-06 NOTE — Consult Note (Addendum)
Reason for Consult: cardiac clearance   Referring Physician: Dr. Shanon Brow   PCP:  [name], MD  Primary Cardiologist:Dr. Clayton Bosserman is an 69 y.o. male.    Chief Complaint: severe back pain.    HPI: 69 year old male with CAD noted below, dm poorly controlled, parkinsons now with worsening severe lower back pain for the last week. he has had total of 3 steroid injections and oral prednisone.  Now with severe back pain.  Over last day or so unable to walk even with walker.   He was admitted yesterday and Mri showed no change from previous imaging and showed no impingement of nerve/spine. He has seen neurosurgery Dr Hal Neer who did his cervical fusion years ago who wanted to obtain a myelogram for which patient has to be off his plavix.  We have been asked to see if any safe solutions.      He has known history of coronary artery disease status post multiple PCI in the past at [facility]. He presented in April of 2013 with chest pain. He underwent cardiac catheterization which showed patent stents. However, there was a 90% stenosis in the proximal first diagonal. There was 40% disease in the mid LAD and moderate disease in the left circumflex. He underwent an angioplasty and drug-eluting stent placement to the diagonal without complications. he has known history of refractory hypertension. Renal artery duplex ultrasound showed no evidence of renal artery stenosis. He was seen in March and April  for chest pain with minimal activities worrisome for class III angina. Dr. Fletcher [name] proceeded with cardiac catheterization 02/23/15 which showed patent stents in the RCA, left circumflex and first diagonal. There was severe in-stent restenosis in the mid LAD. He performed successful angioplasty and drug-eluting stent placement to the mid LAD. The patient was noted to have diffuse diabetic vessels. Chest pain resolved since then. Last echo 06/18/14 with EF 55-60% moderate concentric  LVH.   However, he started having left-sided back pain and leg numbness due to herniated disc which was noted on MRI. This has caused him significant pain and elevation in blood pressure. He was hospitalized for elevated blood pressure. He had an episode of hypotension that required rapid response but no CPR. He had an epidural steroid injection done recently with partial relief. On last visit with Dr. Fletcher [name] 03/14/15 it was noted "Unfortunately, Plavix cannot be interrupted safely at the present time for back surgery"  Now with severe back pain pt could not stand to void today due to pain. The inability to move well and the severe pain is taking a toll on pt psychologically as well.   EKG SR chronic ST-T wave changes, no changes from April 2016.  No chest pain since stent was placed.  Some SOB with exertion but this is with his back pain as well.  Mild lower ext edema.    Past Medical History  Diagnosis Date  . Shingles   . Diabetes mellitus   . Hyperlipidemia   . Broken leg     left...s/p pins  . Lower leg pain     chronic ,left leg  . Acute angina   . Depression   . CAD (coronary artery disease)     s/p multiple PCI with stent RCA,LAD and obtuse marginal,followed @ [facility] on the accord study.., cath 02/2012 90% D1, s/p drug-eluting stent Dr. Fletcher [name]  . Falls 09/2011  . Dizziness   . Shortness of breath   .  Headache(784.0)   . Hypertension     dr Jerilynn Mages Audelia Acton     [facility] in [place]  . Stroke   . Bell's palsy   . Parkinson disease   . Neuropathy     Past Surgical History  Procedure Laterality Date  . Appendectomy    . Cholecystectomy    . Left leg surgery    . Left foot surgery    . Drainage port in left testicle    . Cardiac catheterization  02/2012  . Back surgery    . Anterior cervical decomp/discectomy fusion N/A 04/30/2013    Procedure: ANTERIOR CERVICAL DECOMPRESSION/DISCECTOMY FUSION 2 LEVELS;  Surgeon: Faythe Ghee, MD;  Location: [facility];  Service: Neurosurgery;   Laterality: N/A;  Cervical four-five, Cervical six-seven anterior cervical decompression fusion with trabecular metal cage and plate  . Left heart catheterization with coronary angiogram N/A 02/23/2015    Procedure: LEFT HEART CATHETERIZATION WITH CORONARY ANGIOGRAM;  Surgeon: [name], MD;  Location: [facility];  Service: Cardiovascular;  Laterality: N/A;    Family History  Problem Relation Age of Onset  . Cancer Mother     BREAST  . Heart disease Mother     STENT  . Cancer Father     THROAT  . Heart disease Sister     STENT; HTN  . Heart attack Brother   . Hypertension Brother   . Hypertension Sister    Social History:  reports that he quit smoking about 43 years ago. He has never used smokeless tobacco. He reports that he does not drink alcohol or use illicit drugs.  Allergies:  Allergies  Allergen Reactions  . Codeine Nausea Only    Derivatives -Nausea.  . Tramadol Other (See Comments)    unknown  . Trazodone And Nefazodone Nausea Only    OUTPATIENT MEDICATIONS: No current facility-administered medications on file prior to encounter.   Current Outpatient Prescriptions on File Prior to Encounter  Medication Sig Dispense Refill  . acetaminophen (TYLENOL) 325 MG tablet Take 2 tablets (650 mg total) by mouth every 4 (four) hours as needed for headache or mild pain.    Marland Kitchen amLODipine (NORVASC) 5 MG tablet Take 1 tablet (5 mg total) by mouth daily. (Patient taking differently: Take 10 mg by mouth daily. ) 90 tablet 3  . carbidopa-levodopa (SINEMET IR) 25-100 MG per tablet Take 2 tablets by mouth 3 (three) times daily. (Patient taking differently: Take 1 tablet by mouth 3 (three) times daily. ) 270 tablet 2  . carvedilol (COREG) 25 MG tablet TAKE ONE TABLET BY MOUTH TWICE DAILY 180 tablet 1  . clopidogrel (PLAVIX) 75 MG tablet Take 1 tablet (75 mg total) by mouth daily with breakfast. 90 tablet 1  . cyanocobalamin (,VITAMIN B-12,) 1000 MCG/ML injection Inject 1,000 mcg into  the muscle every 30 (thirty) days.     . DULoxetine (CYMBALTA) 60 MG capsule TAKE 1 CAPSULE DAILY 90 capsule 3  . furosemide (LASIX) 20 MG tablet Take 1 tablet (20 mg total) by mouth daily. One tablet daily for two days 3 tablet 0  . HYDROcodone-acetaminophen (NORCO/VICODIN) 5-325 MG per tablet Take 1-2 tablets by mouth every 8 (eight) hours as needed. (Patient taking differently: Take 1-2 tablets by mouth every 8 (eight) hours as needed for moderate pain. ) 90 tablet 0  . insulin regular human CONCENTRATED (HUMULIN R U-500 KWIKPEN) 500 UNIT/ML SOLN injection Inject 90 units before each of the 3 meals, under skin, 30 min before meals (  Patient taking differently: Inject 20-30 Units into the skin 3 (three) times daily with meals. Takes 20 units in am, 20 units at lunch and 30 units in evening IF CBG BOTTOMS OUT, THEN REDUCING EACH DOSE BY 5 UNITS UNTIL CBG IS NORMAL RANGE) 54 mL 3  . lisinopril-hydrochlorothiazide (PRINZIDE,ZESTORETIC) 20-12.5 MG per tablet Take 1 tablet by mouth 2 (two) times daily. 180 tablet 1  . metFORMIN (GLUCOPHAGE) 1000 MG tablet TAKE ONE TABLET BY MOUTH TWICE DAILY 180 tablet 1  . pantoprazole (PROTONIX) 40 MG tablet Take 1 tablet (40 mg total) by mouth 2 (two) times daily. 180 tablet 3  . pregabalin (LYRICA) 150 MG capsule Take 1 capsule (150 mg total) by mouth 2 (two) times daily. 180 capsule 1  . simvastatin (ZOCOR) 20 MG tablet Take 1 tablet (20 mg total) by mouth at bedtime. 90 tablet 3  . spironolactone (ALDACTONE) 25 MG tablet Take 2 tablets (50 mg total) by mouth daily. (Patient taking differently: Take 25 mg by mouth 2 (two) times daily. ) 60 tablet 5  . tiZANidine (ZANAFLEX) 4 MG tablet Take 1 tablet (4 mg total) by mouth every 8 (eight) hours as needed. for pain 63 tablet 0  . glucose blood (FREESTYLE TEST STRIPS) test strip Use to check blood sugar up to three times daily 300 each 1  . Insulin Glargine (TOUJEO SOLOSTAR) 300 UNIT/ML SOPN Inject 55 Units into the skin 2  (two) times daily at 8 am and 10 pm. (Patient taking differently: Inject 70 Units into the skin 2 (two) times daily at 8 am and 10 pm. ) 27 pen 1  . insulin lispro (HUMALOG) 100 UNIT/ML injection Inject 0.5-0.55 mLs (50-55 Units total) into the skin 3 (three) times daily before meals. 50 mL 0  . nitroGLYCERIN (NITROSTAT) 0.4 MG SL tablet Place 1 tablet (0.4 mg total) under the tongue every 5 (five) minutes as needed for chest pain. 30 tablet 6  . predniSONE (DELTASONE) 20 MG tablet Take 1 tablet (20 mg total) by mouth daily with breakfast. (Patient not taking: Reported on 05/05/2015) 30 tablet 0     Results for orders placed or performed during the hospital encounter of 05/05/15 (from the past 48 hour(s))  CBC with Differential/Platelet     Status: Abnormal   Collection Time: 05/05/15  4:57 PM  Result Value Ref Range   WBC 11.0 (H) 4.0 - 10.5 K/uL   RBC 5.67 4.22 - 5.81 MIL/uL   Hemoglobin 14.4 13.0 - 17.0 g/dL   HCT 43.4 39.0 - 52.0 %   MCV 76.5 (L) 78.0 - 100.0 fL   MCH 25.4 (L) 26.0 - 34.0 pg   MCHC 33.2 30.0 - 36.0 g/dL   RDW 17.9 (H) 11.5 - 15.5 %   Platelets 191 150 - 400 K/uL   Neutrophils Relative % 72 43 - 77 %   Neutro Abs 8.0 (H) 1.7 - 7.7 K/uL   Lymphocytes Relative 18 12 - 46 %   Lymphs Abs 2.0 0.7 - 4.0 K/uL   Monocytes Relative 7 3 - 12 %   Monocytes Absolute 0.7 0.1 - 1.0 K/uL   Eosinophils Relative 3 0 - 5 %   Eosinophils Absolute 0.3 0.0 - 0.7 K/uL   Basophils Relative 0 0 - 1 %   Basophils Absolute 0.0 0.0 - 0.1 K/uL  Basic metabolic panel     Status: Abnormal   Collection Time: 05/05/15  4:57 PM  Result Value Ref Range   Sodium 138 135 -  145 mmol/L   Potassium 3.9 3.5 - 5.1 mmol/L   Chloride 101 101 - 111 mmol/L   CO2 21 (L) 22 - 32 mmol/L   Glucose, Bld 286 (H) 65 - 99 mg/dL   BUN 13 6 - 20 mg/dL   Creatinine, Ser 1.11 0.61 - 1.24 mg/dL   Calcium 8.8 (L) 8.9 - 10.3 mg/dL   GFR calc non Af Amer >60 >60 mL/min   GFR calc Af Amer >60 >60 mL/min    Comment:  (NOTE) The eGFR has been calculated using the CKD EPI equation. This calculation has not been validated in all clinical situations. eGFR's persistently <60 mL/min signify possible Chronic Kidney Disease.    Anion gap 16 (H) 5 - 15  Urinalysis, Routine w reflex microscopic (not at [facility])     Status: Abnormal   Collection Time: 05/05/15  7:20 PM  Result Value Ref Range   Color, Urine AMBER (A) YELLOW    Comment: BIOCHEMICALS MAY BE AFFECTED BY COLOR   APPearance CLEAR CLEAR   Specific Gravity, Urine 1.016 1.005 - 1.030   pH 5.5 5.0 - 8.0   Glucose, UA 250 (A) NEGATIVE mg/dL   Hgb urine dipstick NEGATIVE NEGATIVE   Bilirubin Urine NEGATIVE NEGATIVE   Ketones, ur 15 (A) NEGATIVE mg/dL   Protein, ur >300 (A) NEGATIVE mg/dL   Urobilinogen, UA 1.0 0.0 - 1.0 mg/dL   Nitrite NEGATIVE NEGATIVE   Leukocytes, UA NEGATIVE NEGATIVE  Urine microscopic-add on     Status: Abnormal   Collection Time: 05/05/15  7:20 PM  Result Value Ref Range   Squamous Epithelial / LPF FEW (A) RARE   WBC, UA 0-2 <3 WBC/hpf   Casts HYALINE CASTS (A) NEGATIVE  Glucose, capillary     Status: Abnormal   Collection Time: 05/05/15 10:57 PM  Result Value Ref Range   Glucose-Capillary 374 (H) 65 - 99 mg/dL   Comment 1 Notify RN   Basic metabolic panel     Status: Abnormal   Collection Time: 05/06/15  3:18 AM  Result Value Ref Range   Sodium 135 135 - 145 mmol/L   Potassium 4.7 3.5 - 5.1 mmol/L    Comment: DELTA CHECK NOTED NO VISIBLE HEMOLYSIS    Chloride 101 101 - 111 mmol/L   CO2 24 22 - 32 mmol/L   Glucose, Bld 451 (H) 65 - 99 mg/dL   BUN 16 6 - 20 mg/dL   Creatinine, Ser 1.42 (H) 0.61 - 1.24 mg/dL   Calcium 8.5 (L) 8.9 - 10.3 mg/dL   GFR calc non Af Amer 49 (L) >60 mL/min   GFR calc Af Amer 56 (L) >60 mL/min    Comment: (NOTE) The eGFR has been calculated using the CKD EPI equation. This calculation has not been validated in all clinical situations. eGFR's persistently <60 mL/min signify possible  Chronic Kidney Disease.    Anion gap 10 5 - 15  CBC     Status: Abnormal   Collection Time: 05/06/15  3:18 AM  Result Value Ref Range   WBC 9.5 4.0 - 10.5 K/uL   RBC 5.04 4.22 - 5.81 MIL/uL   Hemoglobin 12.8 (L) 13.0 - 17.0 g/dL   HCT 38.6 (L) 39.0 - 52.0 %   MCV 76.6 (L) 78.0 - 100.0 fL   MCH 25.4 (L) 26.0 - 34.0 pg   MCHC 33.2 30.0 - 36.0 g/dL   RDW 17.9 (H) 11.5 - 15.5 %   Platelets 175 150 - 400 K/uL  Glucose, capillary     Status: Abnormal   Collection Time: 05/06/15  7:45 AM  Result Value Ref Range   Glucose-Capillary 340 (H) 65 - 99 mg/dL   Mr Lumbar Spine Wo Contrast  05/05/2015   CLINICAL DATA:  Worsening left lower extremity weakness.  EXAM: MRI LUMBAR SPINE WITHOUT CONTRAST  TECHNIQUE: Multiplanar, multisequence MR imaging of the lumbar spine was performed. No intravenous contrast was administered.  COMPARISON:  03/01/2015  FINDINGS: Vertebral alignment is within normal limits. Prior vertebral augmentation is again noted at T12. Lumbar vertebral body heights are preserved. No vertebral marrow edema is seen. Vertebral body heights are relatively well preserved. Conus medullaris is normal in signal and terminates at the inferior aspect of L1. Tiny right renal cyst is again noted.  L1-2 and L2-3:  Negative.  L3-4: Minimal disc bulging and mild facet hypertrophy without stenosis, unchanged.  L4-5: Mild disc bulging asymmetric to the left and mild facet hypertrophy result in minimal left lateral recess narrowing, unchanged. No spinal canal or neural foraminal stenosis.  L5-S1: Shallow left foraminal disc protrusion and mild facet hypertrophy without stenosis, unchanged.  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 No clear neural impingement.   Electronically Signed   By: Logan Bores   On: 05/05/2015 19:13    ROS: General:no colds or fevers, + weight loss ? Pt has not lost the weight inaccurate reading for 05/05/15 Skin:no rashes or  ulcers HEENT:no blurred vision, no congestion CV:see HPI PUL:see HPI GI:no diarrhea constipation or melena, no indigestion GU:no hematuria, no dysuria MS:no joint pain, no claudication Neuro:no syncope, occ lightheadedness may be his pain meds Endo:+ diabetes-poorly controlled at times, no thyroid disease   Blood pressure 120/69, pulse 70, temperature 97.9 F (36.6 C), temperature source Oral, resp. rate 18, height 6' (1.829 m), weight 161 lb (73.029 kg), SpO2 94 %.  Wt Readings from Last 3 Encounters:  05/05/15 161 lb (73.029 kg)  04/05/15 255 lb (115.667 kg)  04/04/15 258 lb 4 oz (117.141 kg)    PE: General:Pleasant affect but stressed, NAD Skin:Warm and dry, brisk capillary refill HEENT:normocephalic, sclera clear, mucus membranes moist Neck:supple, no JVD, no bruits  Heart:S1S2 RRR without murmur, gallup, rub or click Lungs:clear, ant.  without rales, rhonchi, or wheezes ZOX:WRUEA, soft, non tender, + BS, do not palpate liver spleen or masses Ext:tr lower ext edema, 2+ pedal pulses, 2+ radial pulses Neuro:alert and oriented X 3, MAE, follows commands, + facial symmetry No tele   Assessment/Plan Principal Problem:   Intractable back pain with 3 recent spinal injections and oral steroids and continues with severe pain.  Unable to stand this AM to void.  Pt and wife are very stressed pt does not feel he can endure much more pain.  His lt leg is weak and he cannot do cardiac rehab due to this.    Active Problems:   Coronary stent occlusion s/p DES restenting of LAD 4/16 on Plavix/asa needs dual antiplatelet therapy for one year. On BB statin, Dr. Irish Lack has seen- option would be stop Plavix and check p2Y12 for platelet inhibition and once level >208 begin cangrelor IV - then stopcangrelor before procedure - post procedure depending on result reload on plavix 300 mg and resume 75 mg daily or resume cangrelor if need for surgery.   Has not had plavix today will stop and order  p2Y12 daily.  Will follow.   CAD (coronary artery disease)   Hypertension- controlled  BB CCB  DM (diabetes mellitus), type 2 with neurological complications   Gait abnormality   Obesity-BMI 35   Parkinson's   Hyperlipidemia on zocor    [facility] R  Nurse Practitioner Certified [place] Pager [phone] or after 5pm or weekends call [phone] 05/06/2015, 11:36 AM     I have examined the patient and reviewed assessment and plan and discussed with patient.  Agree with above as stated.  Plan as noted above.  Will have to check with pharmacy to see if IV Cangrelor can be used for several days duration.  It has a lower bleeding risk than IV tiroban, which would be the other option.  Explained to the patient that he would be at risk for the time that he is not on any antiplatelet agent, for MI, stent thrombosis.  Unfortunately, there is no option that protects his stent continuously, while allowing myelogram or surgery.  Will have to discuss with neurosurgery as to when antiplatelet therapy could be restarted after back procedure completed.  IV Cangrelor is essentially an IV form of Plavix.  It is typically used in the cath lab but this may be a situation where it could be used outside of the cath lab.  Check daily P2 Y 12 assay to see when the Plavix has become subtherapeutic. He missed the dose of Plavix today. Will check P2 Y 12 in the morning. I suspect it will be a couple of days before the Plavix wears off enough that he requires IV medicine. We'll follow along closely.  Await to see which procedure neurosurgery would recommend and what the total time off of anti-platelet therapy needed would be.  Kehlani Vancamp S.

## 2015-05-07 ENCOUNTER — Encounter (HOSPITAL_COMMUNITY): Payer: Medicare Other

## 2015-05-07 ENCOUNTER — Observation Stay (HOSPITAL_COMMUNITY): Payer: Medicare Other

## 2015-05-07 DIAGNOSIS — R600 Localized edema: Secondary | ICD-10-CM

## 2015-05-07 DIAGNOSIS — I2510 Atherosclerotic heart disease of native coronary artery without angina pectoris: Secondary | ICD-10-CM | POA: Diagnosis not present

## 2015-05-07 DIAGNOSIS — M25559 Pain in unspecified hip: Secondary | ICD-10-CM | POA: Insufficient documentation

## 2015-05-07 DIAGNOSIS — M5125 Other intervertebral disc displacement, thoracolumbar region: Secondary | ICD-10-CM | POA: Diagnosis not present

## 2015-05-07 DIAGNOSIS — M25552 Pain in left hip: Secondary | ICD-10-CM

## 2015-05-07 DIAGNOSIS — M542 Cervicalgia: Secondary | ICD-10-CM | POA: Insufficient documentation

## 2015-05-07 DIAGNOSIS — M5416 Radiculopathy, lumbar region: Secondary | ICD-10-CM | POA: Diagnosis not present

## 2015-05-07 DIAGNOSIS — T82897S Other specified complication of cardiac prosthetic devices, implants and grafts, sequela: Secondary | ICD-10-CM | POA: Diagnosis not present

## 2015-05-07 DIAGNOSIS — N183 Chronic kidney disease, stage 3 (moderate): Secondary | ICD-10-CM | POA: Diagnosis not present

## 2015-05-07 DIAGNOSIS — M549 Dorsalgia, unspecified: Secondary | ICD-10-CM | POA: Diagnosis not present

## 2015-05-07 DIAGNOSIS — G8929 Other chronic pain: Secondary | ICD-10-CM | POA: Diagnosis not present

## 2015-05-07 DIAGNOSIS — M47814 Spondylosis without myelopathy or radiculopathy, thoracic region: Secondary | ICD-10-CM | POA: Diagnosis not present

## 2015-05-07 DIAGNOSIS — R29898 Other symptoms and signs involving the musculoskeletal system: Secondary | ICD-10-CM | POA: Diagnosis not present

## 2015-05-07 DIAGNOSIS — M5124 Other intervertebral disc displacement, thoracic region: Secondary | ICD-10-CM | POA: Diagnosis not present

## 2015-05-07 DIAGNOSIS — M25452 Effusion, left hip: Secondary | ICD-10-CM | POA: Diagnosis not present

## 2015-05-07 LAB — GLUCOSE, CAPILLARY
GLUCOSE-CAPILLARY: 384 mg/dL — AB (ref 65–99)
Glucose-Capillary: 348 mg/dL — ABNORMAL HIGH (ref 65–99)
Glucose-Capillary: 368 mg/dL — ABNORMAL HIGH (ref 65–99)
Glucose-Capillary: 371 mg/dL — ABNORMAL HIGH (ref 65–99)
Glucose-Capillary: 407 mg/dL — ABNORMAL HIGH (ref 65–99)
Glucose-Capillary: 420 mg/dL — ABNORMAL HIGH (ref 65–99)

## 2015-05-07 LAB — HEMOGLOBIN A1C
Hgb A1c MFr Bld: 9.1 % — ABNORMAL HIGH (ref 4.8–5.6)
Mean Plasma Glucose: 214 mg/dL

## 2015-05-07 LAB — PLATELET INHIBITION P2Y12: Platelet Function  P2Y12: 214 [PRU] (ref 194–418)

## 2015-05-07 MED ORDER — CLOPIDOGREL BISULFATE 75 MG PO TABS
600.0000 mg | ORAL_TABLET | Freq: Once | ORAL | Status: AC
  Administered 2015-05-07: 600 mg via ORAL
  Filled 2015-05-07: qty 8

## 2015-05-07 MED ORDER — FUROSEMIDE 10 MG/ML IJ SOLN: 40.0000 mg | Freq: Once | INTRAMUSCULAR | Status: DC

## 2015-05-07 MED ORDER — PREGABALIN 75 MG PO CAPS
150.0000 mg | ORAL_CAPSULE | ORAL | Status: DC
  Administered 2015-05-08 – 2015-05-09 (×2): 150 mg via ORAL
  Filled 2015-05-07 (×2): qty 2

## 2015-05-07 MED ORDER — GABAPENTIN 300 MG PO CAPS
300.0000 mg | ORAL_CAPSULE | Freq: Every day | ORAL | Status: DC
  Administered 2015-05-07 – 2015-05-08 (×2): 300 mg via ORAL
  Filled 2015-05-07 (×2): qty 1

## 2015-05-07 MED ORDER — INSULIN ASPART 100 UNIT/ML ~~LOC~~ SOLN
6.0000 [IU] | Freq: Three times a day (TID) | SUBCUTANEOUS | Status: DC
  Administered 2015-05-07 – 2015-05-08 (×3): 6 [IU] via SUBCUTANEOUS

## 2015-05-07 MED ORDER — METHOCARBAMOL 1000 MG/10ML IJ SOLN: 500.0000 mg | Freq: Four times a day (QID) | INTRAVENOUS | Status: DC

## 2015-05-07 MED ORDER — DIAZEPAM 2 MG PO TABS
2.0000 mg | ORAL_TABLET | Freq: Once | ORAL | Status: AC
  Administered 2015-05-07: 2 mg via ORAL

## 2015-05-07 MED ORDER — FUROSEMIDE 10 MG/ML IJ SOLN
20.0000 mg | Freq: Once | INTRAMUSCULAR | Status: AC
  Administered 2015-05-07: 20 mg via INTRAVENOUS

## 2015-05-07 MED ORDER — DIAZEPAM 2 MG PO TABS: 2.0000 mg | ORAL_TABLET | Freq: Every day | ORAL | Status: DC

## 2015-05-07 MED ORDER — METHOCARBAMOL 500 MG PO TABS
500.0000 mg | ORAL_TABLET | Freq: Four times a day (QID) | ORAL | Status: DC
  Administered 2015-05-07: 500 mg via ORAL
  Filled 2015-05-07 (×2): qty 1

## 2015-05-07 MED ORDER — FUROSEMIDE 10 MG/ML IJ SOLN
INTRAMUSCULAR | Status: AC
  Filled 2015-05-07: qty 4

## 2015-05-07 MED ORDER — DIAZEPAM 5 MG PO TABS
ORAL_TABLET | ORAL | Status: AC
  Filled 2015-05-07: qty 1

## 2015-05-07 NOTE — Progress Notes (Signed)
Patient ID: Steve Phillips, male   DOB: 28-Dec-1945, 69 y.o.   MRN: [id number] Subjective:  The patient is somnolent and easily falls asleep. When awoken he is alert and pleasant. He continues to complain of left leg pain.  Objective: Vital signs in last 24 hours: Temp:  [97.9 F (36.6 C)-99.5 F (37.5 C)] 97.9 F (36.6 C) (06/18 0509) Pulse Rate:  [72-94] 74 (06/18 0509) Resp:  [12-19] 12 (06/18 0509) BP: (120-185)/(51-76) 120/51 mmHg (06/18 0509) SpO2:  [93 %-98 %] 93 % (06/18 0509) Weight:  [117.935 kg (260 lb)-122.29 kg (269 lb 9.6 oz)] 122.29 kg (269 lb 9.6 oz) (06/18 0600)  Intake/Output from previous day: 06/17 0701 - 06/18 0700 In: 720 [P.O.:720] Out: 1340 [Urine:1340] Intake/Output this shift:    Physical exam the patient is somnolent but easily arousable. He is moving his lower extremities well.  I reviewed the patient's thoracic MRI performed yesterday. It demonstrates some epidural lipomatosis but I don't see any significant neural compression to explain his symptoms.  I have also reviewed the patient's cervical MRI performed yesterday. It demonstrates he has had a anterior cervical cystectomy, fusion, and plating. He has some mild narrowing. Again I don't see any clear explanation of his left leg pain.  I've also reviewed the patient's pelvis x-ray. His hips appear fairly normal.  Lab Results:  Recent Labs  05/05/15 1657 05/06/15 0318  WBC 11.0* 9.5  HGB 14.4 12.8*  HCT 43.4 38.6*  PLT 191 175   BMET  Recent Labs  05/05/15 1657 05/06/15 0318  NA 138 135  K 3.9 4.7  CL 101 101  CO2 21* 24  GLUCOSE 286* 451*  BUN 13 16  CREATININE 1.11 1.42*  CALCIUM 8.8* 8.5*    Studies/Results: Dg Pelvis 1-2 Views  05/06/2015   CLINICAL DATA:  Left hip and pelvic pain for 3 weeks without known injury. Initial encounter.  EXAM: PELVIS - 1-2 VIEW  COMPARISON:  None.  FINDINGS: There is no evidence of pelvic fracture or diastasis. No pelvic bone lesions are seen. Hip  and sacroiliac joints appear normal.  IMPRESSION: Normal pelvis.   Electronically Signed   By: Marijo Conception, M.Phillips.   On: 05/06/2015 16:59   Mr Cervical Spine Wo Contrast  05/07/2015   CLINICAL DATA:  Initial evaluation for persistent left leg pain, weakness, swelling.  EXAM: MRI CERVICAL AND LUMBAR SPINE WITHOUT CONTRAST  TECHNIQUE: Multiplanar and multiecho pulse sequences of the cervical spine, to include the craniocervical junction and cervicothoracic junction, and lumbar spine, were obtained without intravenous contrast.  COMPARISON:  None.  FINDINGS: MRI CERVICAL SPINE FINDINGS  Patchy T2 hyperdensity within the partially visualized pons noted, which may be related chronic small vessel ischemic disease. Brainstem and cervical spinal cord are mildly atrophic in appearance. Mild cerebellar atrophy. Craniocervical junction widely patent.  Mild straightening with slight reversal of the normal cervical lordosis with apex at C5. No listhesis. Patient is status post ACDF at C4-5 and C6-7. Hardware appears well positioned. Signal intensity within the vertebral body bone marrow is normal. No focal osseous lesion. No marrow edema.  Signal intensity within the cervical spinal cord is normal. No focal intra medullary lesions.  Paraspinous soft tissues are unremarkable. Normal intravascular flow voids present within the vertebral arteries bilaterally. No prevertebral edema.  C2-3: Broad-based posterior disc bulge mildly effaces the ventral thecal sac with resultant mild canal narrowing. Foramina remain widely patent.  C3-4: Mild diffuse degenerative disc osteophyte with mild bilateral uncovertebral hypertrophy.  A superimposed central/right paracentral shallow disc protrusion indents the ventral thecal sac and contacts the cervical spinal cord. There is resultant moderate canal stenosis. Cervical spinal cord is mildly flattened without cord signal changes. There is mild left foraminal narrowing without significant right  foraminal stenosis.  C4-5: Status post ACDF. Moderate canal stenosis. Mild left-sided uncovertebral hypertrophy with resultant mild to moderate left foraminal narrowing. No significant right foraminal stenosis.  C5-6: Mild diffuse degenerative disc osteophyte with right-sided facet arthrosis. Posterior disc osteophyte partially effaces the ventral thecal sac and results in moderate canal stenosis. There is moderate right foraminal narrowing without significant left foraminal stenosis.  C6-7: Status post ACDF. Moderate canal narrowing. Uncovertebral and facet hypertrophy present bilaterally, left greater than right. There is resultant moderate bilateral foraminal narrowing, left greater than right.  C7-T1: Mild right-sided uncovertebral hypertrophy with resultant mild right foraminal narrowing. Left neural foramen remains widely patent. Minimal posterior disc osteophyte without significant canal stenosis.  MRI THORACIC SPINE FINDINGS  Vertebral bodies are normally aligned with preservation of the normal thoracic kyphosis.  Sequelae of prior T12 kyphoplasty present. There is mild chronic anterior height loss at the superior endplate at this level.  Otherwise, vertebral body heights error fairly well-maintained. There are scattered chronic degenerative endplate Schmorl's nodes throughout the mid and lower thoracic spine, most prevalent about the T6-7 intervertebral disc space an the inferior endplate of T8 and T9. Fatty degenerative endplate changes present at the superior endplate of T8. Diffuse disc desiccation present.  Bone marrow signal intensity within normal limits. No focal osseous lesion. No marrow edema.  Signal intensity within the thoracic spinal cord is within normal limits. No focal intra medullary lesion. There is prominent epidural lipomatosis within the dorsal epidural space extending from T3-4 through T9-10. Thoracic spinal cord is displaced anteriorly at these levels.  Paraspinous soft tissues are  within normal limits. Mild atelectatic changes noted within the visualized lungs.  At T4-5, there is a small left paracentral/foraminal disc protrusion partially effacing the left ventral thecal sac and flattening the left hemi cord without cord signal changes (series 15, image 14). No significant canal or foraminal narrowing.  At T6-7, there is a tiny right paracentral disc protrusion minimally indenting the ventral thecal sac without significant stenosis (series 15, image 20).  At T7-8, a small right paracentral disc protrusion flattens the right ventral thecal sac with mild flattening of the right hemi cord. No cord signal changes or significant stenosis.  At T8-9, tiny left paracentral disc protrusion without significant stenosis (series 16, image 26).  At T9-10, there is prominent bilateral facet arthrosis without significant stenosis. Minimal disc bulge at this level.  At T10-11, mild bilateral facet arthrosis without significant stenosis.  IMPRESSION: MRI CERVICAL SPINE IMPRESSION:  1. Status post ACDF at C4-5 and P2-9 without complication. 2. 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 Mild superimposed degenerative spondylolysis as above. No cord signal changes identified. 3. Small vessel type changes within the partially visualized pons.  MRI THORACIC SPINE IMPRESSION:  1. Mild multilevel degenerative spondylolysis as detailed above without significant canal or foraminal stenosis. 2. Sequelae of prior vertebral augmentation within the T12 vertebral body. 3. Prominent epidural lipomatosis within the dorsal epidural space extending from T3-4 through T9-10.   Electronically Signed   By: Jeannine Boga M.Phillips.   On: 05/07/2015 03:13   Mr Thoracic Spine Wo Contrast  05/07/2015   CLINICAL DATA:  Initial evaluation  for persistent left leg pain, weakness, swelling.  EXAM: MRI CERVICAL AND LUMBAR SPINE WITHOUT CONTRAST   TECHNIQUE: Multiplanar and multiecho pulse sequences of the cervical spine, to include the craniocervical junction and cervicothoracic junction, and lumbar spine, were obtained without intravenous contrast.  COMPARISON:  None.  FINDINGS: MRI CERVICAL SPINE FINDINGS  Patchy T2 hyperdensity within the partially visualized pons noted, which may be related chronic small vessel ischemic disease. Brainstem and cervical spinal cord are mildly atrophic in appearance. Mild cerebellar atrophy. Craniocervical junction widely patent.  Mild straightening with slight reversal of the normal cervical lordosis with apex at C5. No listhesis. Patient is status post ACDF at C4-5 and C6-7. Hardware appears well positioned. Signal intensity within the vertebral body bone marrow is normal. No focal osseous lesion. No marrow edema.  Signal intensity within the cervical spinal cord is normal. No focal intra medullary lesions.  Paraspinous soft tissues are unremarkable. Normal intravascular flow voids present within the vertebral arteries bilaterally. No prevertebral edema.  C2-3: Broad-based posterior disc bulge mildly effaces the ventral thecal sac with resultant mild canal narrowing. Foramina remain widely patent.  C3-4: Mild diffuse degenerative disc osteophyte with mild bilateral uncovertebral hypertrophy. A superimposed central/right paracentral shallow disc protrusion indents the ventral thecal sac and contacts the cervical spinal cord. There is resultant moderate canal stenosis. Cervical spinal cord is mildly flattened without cord signal changes. There is mild left foraminal narrowing without significant right foraminal stenosis.  C4-5: Status post ACDF. Moderate canal stenosis. Mild left-sided uncovertebral hypertrophy with resultant mild to moderate left foraminal narrowing. No significant right foraminal stenosis.  C5-6: Mild diffuse degenerative disc osteophyte with right-sided facet arthrosis. Posterior disc osteophyte  partially effaces the ventral thecal sac and results in moderate canal stenosis. There is moderate right foraminal narrowing without significant left foraminal stenosis.  C6-7: Status post ACDF. Moderate canal narrowing. Uncovertebral and facet hypertrophy present bilaterally, left greater than right. There is resultant moderate bilateral foraminal narrowing, left greater than right.  C7-T1: Mild right-sided uncovertebral hypertrophy with resultant mild right foraminal narrowing. Left neural foramen remains widely patent. Minimal posterior disc osteophyte without significant canal stenosis.  MRI THORACIC SPINE FINDINGS  Vertebral bodies are normally aligned with preservation of the normal thoracic kyphosis.  Sequelae of prior T12 kyphoplasty present. There is mild chronic anterior height loss at the superior endplate at this level.  Otherwise, vertebral body heights error fairly well-maintained. There are scattered chronic degenerative endplate Schmorl's nodes throughout the mid and lower thoracic spine, most prevalent about the T6-7 intervertebral disc space an the inferior endplate of T8 and T9. Fatty degenerative endplate changes present at the superior endplate of T8. Diffuse disc desiccation present.  Bone marrow signal intensity within normal limits. No focal osseous lesion. No marrow edema.  Signal intensity within the thoracic spinal cord is within normal limits. No focal intra medullary lesion. There is prominent epidural lipomatosis within the dorsal epidural space extending from T3-4 through T9-10. Thoracic spinal cord is displaced anteriorly at these levels.  Paraspinous soft tissues are within normal limits. Mild atelectatic changes noted within the visualized lungs.  At T4-5, there is a small left paracentral/foraminal disc protrusion partially effacing the left ventral thecal sac and flattening the left hemi cord without cord signal changes (series 15, image 14). No significant canal or foraminal  narrowing.  At T6-7, there is a tiny right paracentral disc protrusion minimally indenting the ventral thecal sac without significant stenosis (series 15, image 20).  At T7-8, a small  right paracentral disc protrusion flattens the right ventral thecal sac with mild flattening of the right hemi cord. No cord signal changes or significant stenosis.  At T8-9, tiny left paracentral disc protrusion without significant stenosis (series 16, image 26).  At T9-10, there is prominent bilateral facet arthrosis without significant stenosis. Minimal disc bulge at this level.  At T10-11, mild bilateral facet arthrosis without significant stenosis.  IMPRESSION: MRI CERVICAL SPINE IMPRESSION:  1. Status post ACDF at C4-5 and S1-7 without complication. 2. 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 Mild superimposed degenerative spondylolysis as above. No cord signal changes identified. 3. Small vessel type changes within the partially visualized pons.  MRI THORACIC SPINE IMPRESSION:  1. Mild multilevel degenerative spondylolysis as detailed above without significant canal or foraminal stenosis. 2. Sequelae of prior vertebral augmentation within the T12 vertebral body. 3. Prominent epidural lipomatosis within the dorsal epidural space extending from T3-4 through T9-10.   Electronically Signed   By: Jeannine Boga M.Phillips.   On: 05/07/2015 03:13   Mr Lumbar Spine Wo Contrast  05/05/2015   CLINICAL DATA:  Worsening left lower extremity weakness.  EXAM: MRI LUMBAR SPINE WITHOUT CONTRAST  TECHNIQUE: Multiplanar, multisequence MR imaging of the lumbar spine was performed. No intravenous contrast was administered.  COMPARISON:  03/01/2015  FINDINGS: Vertebral alignment is within normal limits. Prior vertebral augmentation is again noted at T12. Lumbar vertebral body heights are preserved. No vertebral marrow edema is seen. Vertebral body  heights are relatively well preserved. Conus medullaris is normal in signal and terminates at the inferior aspect of L1. Tiny right renal cyst is again noted.  L1-2 and L2-3:  Negative.  L3-4: Minimal disc bulging and mild facet hypertrophy without stenosis, unchanged.  L4-5: Mild disc bulging asymmetric to the left and mild facet hypertrophy result in minimal left lateral recess narrowing, unchanged. No spinal canal or neural foraminal stenosis.  L5-S1: Shallow left foraminal disc protrusion and mild facet hypertrophy without stenosis, unchanged.  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 No clear neural impingement.   Electronically Signed   By: Logan Bores   On: 05/05/2015 19:13    Assessment/Plan: Left leg pain: The patient's lower extremity ultrasound is pending. His MRIs do not show any clear explanation for his left leg pain. My only suggestion is that we get a hip MRI to rule out occult hip pathology as a cause of the symptoms. I discussed the situation with the patient and his wife.      Steve Phillips 05/07/2015, 9:23 AM

## 2015-05-07 NOTE — Progress Notes (Addendum)
SUBJECTIVE:  Still with leg pain.  Had MRI earlier today.  Dr. Arnoldo Morale saw him as well.   OBJECTIVE:   Vitals:   Filed Vitals:   05/07/15 0041 05/07/15 0226 05/07/15 0509 05/07/15 0600  BP: 172/76 156/58 120/51   Pulse:   74   Temp:   97.9 F (36.6 C)   TempSrc:   Oral   Resp:   12   Height:      Weight:    269 lb 9.6 oz (122.29 kg)  SpO2:   93%    I&O's:   Intake/Output Summary (Last 24 hours) at 05/07/15 1415 Last data filed at 05/07/15 0607  Gross per 24 hour  Intake    360 ml  Output   1140 ml  Net   -780 ml        PHYSICAL EXAM General: Well developed, well nourished, in no acute distress. drowsy Head:   Normal cephalic and atramatic  Lungs:  No wheezing Heart:  RRR  No JVD.   Abdomen: abdomen soft and non-tender Msk:  Back normal,  Normal strength and tone for age. Extremities:  Bilateral LE edema.   Neuro: Alert and oriented. Psych:  Normal affect, responds appropriately Skin: No rash   LABS: Basic Metabolic Panel:  Recent Labs  05/05/15 1657 05/06/15 0318  NA 138 135  K 3.9 4.7  CL 101 101  CO2 21* 24  GLUCOSE 286* 451*  BUN 13 16  CREATININE 1.11 1.42*  CALCIUM 8.8* 8.5*   Liver Function Tests: No results for input(s): AST, ALT, ALKPHOS, BILITOT, PROT, ALBUMIN in the last 72 hours. No results for input(s): LIPASE, AMYLASE in the last 72 hours. CBC:  Recent Labs  05/05/15 1657 05/06/15 0318  WBC 11.0* 9.5  NEUTROABS 8.0*  --   HGB 14.4 12.8*  HCT 43.4 38.6*  MCV 76.5* 76.6*  PLT 191 175   Cardiac Enzymes: No results for input(s): CKTOTAL, CKMB, CKMBINDEX, TROPONINI in the last 72 hours. BNP: Invalid input(s): POCBNP D-Dimer: No results for input(s): DDIMER in the last 72 hours. Hemoglobin A1C:  Recent Labs  05/06/15 1305  HGBA1C 9.1*   Fasting Lipid Panel: No results for input(s): CHOL, HDL, LDLCALC, TRIG, CHOLHDL, LDLDIRECT in the last 72 hours. Thyroid Function Tests: No results for input(s): TSH, T4TOTAL, T3FREE,  THYROIDAB in the last 72 hours.  Invalid input(s): FREET3 Anemia Panel: No results for input(s): VITAMINB12, FOLATE, FERRITIN, TIBC, IRON, RETICCTPCT in the last 72 hours. Coag Panel:   Lab Results  Component Value Date   INR 0.95 02/23/2015   INR 0.9 02/20/2015   INR 1.0 01/04/2014    RADIOLOGY: Dg Pelvis 1-2 Views  05/06/2015   CLINICAL DATA:  Left hip and pelvic pain for 3 weeks without known injury. Initial encounter.  EXAM: PELVIS - 1-2 VIEW  COMPARISON:  None.  FINDINGS: There is no evidence of pelvic fracture or diastasis. No pelvic bone lesions are seen. Hip and sacroiliac joints appear normal.  IMPRESSION: Normal pelvis.   Electronically Signed   By: Marijo Conception, M.D.   On: 05/06/2015 16:59   Mr Cervical Spine Wo Contrast  05/07/2015   CLINICAL DATA:  Initial evaluation for persistent left leg pain, weakness, swelling.  EXAM: MRI CERVICAL AND LUMBAR SPINE WITHOUT CONTRAST  TECHNIQUE: Multiplanar and multiecho pulse sequences of the cervical spine, to include the craniocervical junction and cervicothoracic junction, and lumbar spine, were obtained without intravenous contrast.  COMPARISON:  None.  FINDINGS: MRI CERVICAL  SPINE FINDINGS  Patchy T2 hyperdensity within the partially visualized pons noted, which may be related chronic small vessel ischemic disease. Brainstem and cervical spinal cord are mildly atrophic in appearance. Mild cerebellar atrophy. Craniocervical junction widely patent.  Mild straightening with slight reversal of the normal cervical lordosis with apex at C5. No listhesis. Patient is status post ACDF at C4-5 and C6-7. Hardware appears well positioned. Signal intensity within the vertebral body bone marrow is normal. No focal osseous lesion. No marrow edema.  Signal intensity within the cervical spinal cord is normal. No focal intra medullary lesions.  Paraspinous soft tissues are unremarkable. Normal intravascular flow voids present within the vertebral arteries  bilaterally. No prevertebral edema.  C2-3: Broad-based posterior disc bulge mildly effaces the ventral thecal sac with resultant mild canal narrowing. Foramina remain widely patent.  C3-4: Mild diffuse degenerative disc osteophyte with mild bilateral uncovertebral hypertrophy. A superimposed central/right paracentral shallow disc protrusion indents the ventral thecal sac and contacts the cervical spinal cord. There is resultant moderate canal stenosis. Cervical spinal cord is mildly flattened without cord signal changes. There is mild left foraminal narrowing without significant right foraminal stenosis.  C4-5: Status post ACDF. Moderate canal stenosis. Mild left-sided uncovertebral hypertrophy with resultant mild to moderate left foraminal narrowing. No significant right foraminal stenosis.  C5-6: Mild diffuse degenerative disc osteophyte with right-sided facet arthrosis. Posterior disc osteophyte partially effaces the ventral thecal sac and results in moderate canal stenosis. There is moderate right foraminal narrowing without significant left foraminal stenosis.  C6-7: Status post ACDF. Moderate canal narrowing. Uncovertebral and facet hypertrophy present bilaterally, left greater than right. There is resultant moderate bilateral foraminal narrowing, left greater than right.  C7-T1: Mild right-sided uncovertebral hypertrophy with resultant mild right foraminal narrowing. Left neural foramen remains widely patent. Minimal posterior disc osteophyte without significant canal stenosis.  MRI THORACIC SPINE FINDINGS  Vertebral bodies are normally aligned with preservation of the normal thoracic kyphosis.  Sequelae of prior T12 kyphoplasty present. There is mild chronic anterior height loss at the superior endplate at this level.  Otherwise, vertebral body heights error fairly well-maintained. There are scattered chronic degenerative endplate Schmorl's nodes throughout the mid and lower thoracic spine, most prevalent  about the T6-7 intervertebral disc space an the inferior endplate of T8 and T9. Fatty degenerative endplate changes present at the superior endplate of T8. Diffuse disc desiccation present.  Bone marrow signal intensity within normal limits. No focal osseous lesion. No marrow edema.  Signal intensity within the thoracic spinal cord is within normal limits. No focal intra medullary lesion. There is prominent epidural lipomatosis within the dorsal epidural space extending from T3-4 through T9-10. Thoracic spinal cord is displaced anteriorly at these levels.  Paraspinous soft tissues are within normal limits. Mild atelectatic changes noted within the visualized lungs.  At T4-5, there is a small left paracentral/foraminal disc protrusion partially effacing the left ventral thecal sac and flattening the left hemi cord without cord signal changes (series 15, image 14). No significant canal or foraminal narrowing.  At T6-7, there is a tiny right paracentral disc protrusion minimally indenting the ventral thecal sac without significant stenosis (series 15, image 20).  At T7-8, a small right paracentral disc protrusion flattens the right ventral thecal sac with mild flattening of the right hemi cord. No cord signal changes or significant stenosis.  At T8-9, tiny left paracentral disc protrusion without significant stenosis (series 16, image 26).  At T9-10, there is prominent bilateral facet arthrosis without significant stenosis.  Minimal disc bulge at this level.  At T10-11, mild bilateral facet arthrosis without significant stenosis.  IMPRESSION: MRI CERVICAL SPINE IMPRESSION:  1. Status post ACDF at C4-5 and Y4-0 without complication. 2. 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 Mild superimposed degenerative spondylolysis as above. No cord signal changes identified. 3. Small vessel type changes within the partially visualized  pons.  MRI THORACIC SPINE IMPRESSION:  1. Mild multilevel degenerative spondylolysis as detailed above without significant canal or foraminal stenosis. 2. Sequelae of prior vertebral augmentation within the T12 vertebral body. 3. Prominent epidural lipomatosis within the dorsal epidural space extending from T3-4 through T9-10.   Electronically Signed   By: Jeannine Boga M.D.   On: 05/07/2015 03:13   Mr Thoracic Spine Wo Contrast  05/07/2015   CLINICAL DATA:  Initial evaluation for persistent left leg pain, weakness, swelling.  EXAM: MRI CERVICAL AND LUMBAR SPINE WITHOUT CONTRAST  TECHNIQUE: Multiplanar and multiecho pulse sequences of the cervical spine, to include the craniocervical junction and cervicothoracic junction, and lumbar spine, were obtained without intravenous contrast.  COMPARISON:  None.  FINDINGS: MRI CERVICAL SPINE FINDINGS  Patchy T2 hyperdensity within the partially visualized pons noted, which may be related chronic small vessel ischemic disease. Brainstem and cervical spinal cord are mildly atrophic in appearance. Mild cerebellar atrophy. Craniocervical junction widely patent.  Mild straightening with slight reversal of the normal cervical lordosis with apex at C5. No listhesis. Patient is status post ACDF at C4-5 and C6-7. Hardware appears well positioned. Signal intensity within the vertebral body bone marrow is normal. No focal osseous lesion. No marrow edema.  Signal intensity within the cervical spinal cord is normal. No focal intra medullary lesions.  Paraspinous soft tissues are unremarkable. Normal intravascular flow voids present within the vertebral arteries bilaterally. No prevertebral edema.  C2-3: Broad-based posterior disc bulge mildly effaces the ventral thecal sac with resultant mild canal narrowing. Foramina remain widely patent.  C3-4: Mild diffuse degenerative disc osteophyte with mild bilateral uncovertebral hypertrophy. A superimposed central/right paracentral  shallow disc protrusion indents the ventral thecal sac and contacts the cervical spinal cord. There is resultant moderate canal stenosis. Cervical spinal cord is mildly flattened without cord signal changes. There is mild left foraminal narrowing without significant right foraminal stenosis.  C4-5: Status post ACDF. Moderate canal stenosis. Mild left-sided uncovertebral hypertrophy with resultant mild to moderate left foraminal narrowing. No significant right foraminal stenosis.  C5-6: Mild diffuse degenerative disc osteophyte with right-sided facet arthrosis. Posterior disc osteophyte partially effaces the ventral thecal sac and results in moderate canal stenosis. There is moderate right foraminal narrowing without significant left foraminal stenosis.  C6-7: Status post ACDF. Moderate canal narrowing. Uncovertebral and facet hypertrophy present bilaterally, left greater than right. There is resultant moderate bilateral foraminal narrowing, left greater than right.  C7-T1: Mild right-sided uncovertebral hypertrophy with resultant mild right foraminal narrowing. Left neural foramen remains widely patent. Minimal posterior disc osteophyte without significant canal stenosis.  MRI THORACIC SPINE FINDINGS  Vertebral bodies are normally aligned with preservation of the normal thoracic kyphosis.  Sequelae of prior T12 kyphoplasty present. There is mild chronic anterior height loss at the superior endplate at this level.  Otherwise, vertebral body heights error fairly well-maintained. There are scattered chronic degenerative endplate Schmorl's nodes throughout the mid and lower thoracic spine, most prevalent about the T6-7 intervertebral disc space an the inferior endplate of T8 and T9. Fatty degenerative endplate  changes present at the superior endplate of T8. Diffuse disc desiccation present.  Bone marrow signal intensity within normal limits. No focal osseous lesion. No marrow edema.  Signal intensity within the thoracic  spinal cord is within normal limits. No focal intra medullary lesion. There is prominent epidural lipomatosis within the dorsal epidural space extending from T3-4 through T9-10. Thoracic spinal cord is displaced anteriorly at these levels.  Paraspinous soft tissues are within normal limits. Mild atelectatic changes noted within the visualized lungs.  At T4-5, there is a small left paracentral/foraminal disc protrusion partially effacing the left ventral thecal sac and flattening the left hemi cord without cord signal changes (series 15, image 14). No significant canal or foraminal narrowing.  At T6-7, there is a tiny right paracentral disc protrusion minimally indenting the ventral thecal sac without significant stenosis (series 15, image 20).  At T7-8, a small right paracentral disc protrusion flattens the right ventral thecal sac with mild flattening of the right hemi cord. No cord signal changes or significant stenosis.  At T8-9, tiny left paracentral disc protrusion without significant stenosis (series 16, image 26).  At T9-10, there is prominent bilateral facet arthrosis without significant stenosis. Minimal disc bulge at this level.  At T10-11, mild bilateral facet arthrosis without significant stenosis.  IMPRESSION: MRI CERVICAL SPINE IMPRESSION:  1. Status post ACDF at C4-5 and P6-1 without complication. 2. 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 Mild superimposed degenerative spondylolysis as above. No cord signal changes identified. 3. Small vessel type changes within the partially visualized pons.  MRI THORACIC SPINE IMPRESSION:  1. Mild multilevel degenerative spondylolysis as detailed above without significant canal or foraminal stenosis. 2. Sequelae of prior vertebral augmentation within the T12 vertebral body. 3. Prominent epidural lipomatosis within the dorsal epidural space extending from T3-4 through  T9-10.   Electronically Signed   By: Jeannine Boga M.D.   On: 05/07/2015 03:13   Mr Lumbar Spine Wo Contrast  05/05/2015   CLINICAL DATA:  Worsening left lower extremity weakness.  EXAM: MRI LUMBAR SPINE WITHOUT CONTRAST  TECHNIQUE: Multiplanar, multisequence MR imaging of the lumbar spine was performed. No intravenous contrast was administered.  COMPARISON:  03/01/2015  FINDINGS: Vertebral alignment is within normal limits. Prior vertebral augmentation is again noted at T12. Lumbar vertebral body heights are preserved. No vertebral marrow edema is seen. Vertebral body heights are relatively well preserved. Conus medullaris is normal in signal and terminates at the inferior aspect of L1. Tiny right renal cyst is again noted.  L1-2 and L2-3:  Negative.  L3-4: Minimal disc bulging and mild facet hypertrophy without stenosis, unchanged.  L4-5: Mild disc bulging asymmetric to the left and mild facet hypertrophy result in minimal left lateral recess narrowing, unchanged. No spinal canal or neural foraminal stenosis.  L5-S1: Shallow left foraminal disc protrusion and mild facet hypertrophy without stenosis, unchanged.  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 No clear neural impingement.   Electronically Signed   By: Logan Bores   On: 05/05/2015 19:13   Mr Hip Left Wo Contrast  05/07/2015   CLINICAL DATA:  Low back pain for 2-3 months which has worsened over the past week. Left leg weakness. No known injury. Initial encounter.  EXAM: MR OF THE LEFT HIP WITHOUT CONTRAST  TECHNIQUE: Multiplanar, multisequence MR imaging was performed. No intravenous contrast was administered.  COMPARISON:  CT abdomen and pelvis 03/04/2015.  FINDINGS: Bones: Marrow signal is normal in the femoral heads bilaterally without fracture or avascular necrosis. The pelvis and visualized femurs also demonstrate normal signal.  Articular cartilage and labrum  Articular cartilage:   Unremarkable.  Labrum:  Intact.  Joint or bursal effusion  Joint effusion:  None.  Bursae:  Unremarkable.  Muscles and tendons  Muscles and tendons:  Intact and unremarkable in appearance.  Other findings  Miscellaneous: The prostate gland is enlarged. Fat containing inguinal hernias are seen, larger on the left.  IMPRESSION: Normal-appearing pelvis and hips. No finding to explain the patient's symptoms.  Prostatomegaly.   Electronically Signed   By: Inge Rise M.D.   On: 05/07/2015 13:49      ASSESSMENT: Steve Phillips:    1) I spoke at length with Dr. Arnoldo Morale today. No invasive procedures are planned. Given his recent drug-eluting stent, I discussed the possibility of putting him back on Plavix. Dr. Arnoldo Morale was in agreement that this would be reasonable as no invasive neurosurgical procedures are planned. I have written for 600 mg of Plavix to be given at this time since his platelet inhibition test already shows that his Plavix is subtherapeutic. PRU value was 214 today which is above the 208 cut off.  I explained the rationale to the family. The wife, son and minister were present. They asked about if hip surgery would be required, then what to do. He had a hip MRI today. If something is seen on the MRI that require surgery, would have to stop Plavix again. After 2 missed doses, his Plavix was no longer therapeutic.   Due to the risk of stent thrombosis, I personally spoke to the nurse about the importance of getting him his Plavix as soon as possible.  2) lower extremity swelling: He had been treated in the past with Lasix. He did receive Lasix 20 mg today. This low dose does not seem to be helping him. Of note, I personally reviewed his lower extremity Dopplers which showed no DVT back in April 2016. We'll give a small dose of IV Lasix today as the swelling seems significantly worse.  3) chronic kidney disease: His creatinine has been as somewhat high at times. It's 1.4 today.  Likely, the 20 mg of  oral Lasix would not be very effective. However, I don't want to dehydrate him any further. We'll just try Lasix 20 mg IV 1. See where her creatinine is tomorrow. Normal LV function by echocardiogram in 2015.  Hold spironolactone until [facility] tomorrow.  Restart if Cr trending down.  Will check platelet inhibition test tomorrow. I expect the number should be less than 208. It would no longer need to be checked after that time.   Jettie Booze, MD  05/07/2015  2:15 PM

## 2015-05-07 NOTE — Progress Notes (Signed)
PT Cancellation Note  Patient Details Name: Steve Phillips MRN: [id number] DOB: [birth year]   Cancelled Treatment:    Reason Eval/Treat Not Completed: Patient not medically ready. Noted dopplers to r/o DVT pending. Noted neurosurgery recommended MRI hip (? To be done). Will follow and proceed as appropriate.   Donald Memoli 05/07/2015, 12:20 PM Pager [phone]

## 2015-05-07 NOTE — Progress Notes (Signed)
[facility]                                                                              Patient Demographics  Steve Phillips, is a 69 y.o. male, DOB - [birth year], CHE:[id number]  Admit date - 05/05/2015   Admitting Physician Phillips Grout, MD  Outpatient Primary MD for the patient is [name], MD  LOS -    Chief Complaint  Patient presents with  . Back Pain       Brief HPI   Patient is a 69 year old male with CAD, recent stenting of LAD with DES in April 2016, poorly controlled diabetes medicines, Parkinson's, presented with severe lower back pain for the last 1 week. Patient reported chronic back pain since April of this year. Patient had 3 steroidal injections, oral prednisone. Patient noted that the pain continued to get worse and has been unbearable for last several days despite the home pain medications. He denied any fevers, states that pain radiates to left leg and feels weaker associated with back pain. No saddle anesthesia or urinary or bowel incontinence. Patient also reported that he has been unable to walk in the last 2 days due to severity of pain. Patient had seen neurosurgery who did the cervical fusion surgery years ago who wanted to obtain a myelogram for which patient had to be off the Plavix however cardiology had recommended not to stop the Plavix due to recent stenting of the LAD. Patient was admitted for intractable back pain.   Assessment & Plan    Principal Problem:   Intractable back pain and left leg pain - Patient underwent MRI of the lumbar spine which showed mild lumbar disc degeneration and facet hypertrophy without interval change, no clear neural impingement - Continue Neurontin, Lyrica, pain control with IV Dilaudid, Norco, prednisone - MRI C spine, T spine and hip all unremarkable - Doppler US LE pending, ABI ordered to r/o arterial insufficiency - appreciate neuro surgery follow-up, will also add robaxin, start PT  - dc  prednisone   Active Problems:   CAD (coronary artery disease) - Continue aspirin, Plavix, amlodipine, lisinopril, HCTZ - Appreciate cardiology recommendations     Hypertension - Currently stable, continue amlodipine, Coreg, Lasix, lisinopril, HCTZ    DM (diabetes mellitus), type 2 with neurological complications: Uncontrolled due to prednisone - hemoglobin A1c 9.1, placed on Lantus, sliding scale insulin, meal coverage - DC prednisone      Obesity-BMI 35 - Patient was counseled on diet and weight control  Code status: Full code  Family communication Discussed in detail in detail with the patient, all imaging results, lab results explained to the patient and his wife at the bedside, son on the phone    Disposition Plan:  not medically ready  TimeSpent in minutes 25 minutes  Procedures  MRI lumbar spine   Consults   Neurosurgery Cardiology  DVT Prophylaxis   SCD's  Medications  Scheduled Meds: . amLODipine  5 mg Oral Daily  . aspirin EC  81 mg Oral TID  . carbidopa-levodopa  1 tablet Oral TID  . carvedilol  25 mg Oral BID  . gabapentin  300 mg Oral QHS  . hydrochlorothiazide  12.5 mg Oral Daily   And  . lisinopril  20 mg Oral Daily  . insulin aspart  0-20 Units Subcutaneous TID WC  . insulin aspart  0-5 Units Subcutaneous QHS  . insulin aspart  4 Units Subcutaneous TID WC  . insulin glargine  55 Units Subcutaneous BID  . methocarbamol (ROBAXIN)  IV  500 mg Intravenous Q6H  . pantoprazole  40 mg Oral BID  . predniSONE  20 mg Oral Q breakfast  . [START ON 05/08/2015] pregabalin  150 mg Oral BH-q7a  . simvastatin  20 mg Oral QHS  . sodium chloride  3 mL Intravenous Q12H   Continuous Infusions:  PRN Meds:.sodium chloride, acetaminophen, hydrALAZINE, HYDROcodone-acetaminophen, HYDROmorphone (DILAUDID) injection, nitroGLYCERIN, ondansetron **OR** ondansetron (ZOFRAN) IV, sodium chloride   Antibiotics   Anti-infectives    None        Subjective:   Steve Phillips was seen and examined today. C/o left leg pain, wife also overwhelmed about whole situation, pain controlled. Patient denies dizziness, chest pain, shortness of breath, abdominal pain, N/V/D/C. No acute events overnight.    Objective:   Blood pressure 120/51, pulse 74, temperature 97.9 F (36.6 C), temperature source Oral, resp. rate 12, height 6' (1.829 m), weight 122.29 kg (269 lb 9.6 oz), SpO2 93 %.  Wt Readings from Last 3 Encounters:  05/07/15 122.29 kg (269 lb 9.6 oz)  04/05/15 115.667 kg (255 lb)  04/04/15 117.141 kg (258 lb 4 oz)     Intake/Output Summary (Last 24 hours) at 05/07/15 1517 Last data filed at 05/07/15 0932  Gross per 24 hour  Intake    360 ml  Output   1140 ml  Net   -780 ml    Exam  General: Alert and oriented x 3, NAD  HEENT:  PERRLA, EOMI   Neck: Supple, no JVD  CVS: S1 S2 clear, RRR   Respiratory: CTAB  Abdomen: Soft, nontender, nondistended, + bowel sounds  Ext: no cyanosis clubbing or edema  Neuro: no new deficits  Skin: No rashes  Psych: Normal affect and demeanor, alert and oriented x3    Data Review   Micro Results No results found for this or any previous visit (from the past 240 hour(s)).  Radiology Reports Dg Pelvis 1-2 Views  05/06/2015   CLINICAL DATA:  Left hip and pelvic pain for 3 weeks without known injury. Initial encounter.  EXAM: PELVIS - 1-2 VIEW  COMPARISON:  None.  FINDINGS: There is no evidence of pelvic fracture or diastasis. No pelvic bone lesions are seen. Hip and sacroiliac joints appear normal.  IMPRESSION: Normal pelvis.   Electronically Signed   By: Marijo Conception, M.D.   On: 05/06/2015 16:59   Mr Cervical Spine Wo Contrast  05/07/2015   CLINICAL DATA:  Initial evaluation for persistent left leg pain, weakness, swelling.  EXAM: MRI CERVICAL AND LUMBAR SPINE WITHOUT CONTRAST  TECHNIQUE: Multiplanar and multiecho pulse sequences of the cervical spine, to include the craniocervical junction and  cervicothoracic junction, and lumbar spine, were obtained without intravenous contrast.  COMPARISON:  None.  FINDINGS: MRI CERVICAL SPINE FINDINGS  Patchy T2 hyperdensity within the partially visualized pons noted, which may be related chronic small vessel ischemic disease. Brainstem and cervical spinal cord are mildly atrophic in appearance. Mild cerebellar atrophy. Craniocervical junction widely patent.  Mild straightening with slight reversal of the normal cervical lordosis with apex at C5. No listhesis. Patient is status post ACDF  at C4-5 and C6-7. Hardware appears well positioned. Signal intensity within the vertebral body bone marrow is normal. No focal osseous lesion. No marrow edema.  Signal intensity within the cervical spinal cord is normal. No focal intra medullary lesions.  Paraspinous soft tissues are unremarkable. Normal intravascular flow voids present within the vertebral arteries bilaterally. No prevertebral edema.  C2-3: Broad-based posterior disc bulge mildly effaces the ventral thecal sac with resultant mild canal narrowing. Foramina remain widely patent.  C3-4: Mild diffuse degenerative disc osteophyte with mild bilateral uncovertebral hypertrophy. A superimposed central/right paracentral shallow disc protrusion indents the ventral thecal sac and contacts the cervical spinal cord. There is resultant moderate canal stenosis. Cervical spinal cord is mildly flattened without cord signal changes. There is mild left foraminal narrowing without significant right foraminal stenosis.  C4-5: Status post ACDF. Moderate canal stenosis. Mild left-sided uncovertebral hypertrophy with resultant mild to moderate left foraminal narrowing. No significant right foraminal stenosis.  C5-6: Mild diffuse degenerative disc osteophyte with right-sided facet arthrosis. Posterior disc osteophyte partially effaces the ventral thecal sac and results in moderate canal stenosis. There is moderate right foraminal narrowing  without significant left foraminal stenosis.  C6-7: Status post ACDF. Moderate canal narrowing. Uncovertebral and facet hypertrophy present bilaterally, left greater than right. There is resultant moderate bilateral foraminal narrowing, left greater than right.  C7-T1: Mild right-sided uncovertebral hypertrophy with resultant mild right foraminal narrowing. Left neural foramen remains widely patent. Minimal posterior disc osteophyte without significant canal stenosis.  MRI THORACIC SPINE FINDINGS  Vertebral bodies are normally aligned with preservation of the normal thoracic kyphosis.  Sequelae of prior T12 kyphoplasty present. There is mild chronic anterior height loss at the superior endplate at this level.  Otherwise, vertebral body heights error fairly well-maintained. There are scattered chronic degenerative endplate Schmorl's nodes throughout the mid and lower thoracic spine, most prevalent about the T6-7 intervertebral disc space an the inferior endplate of T8 and T9. Fatty degenerative endplate changes present at the superior endplate of T8. Diffuse disc desiccation present.  Bone marrow signal intensity within normal limits. No focal osseous lesion. No marrow edema.  Signal intensity within the thoracic spinal cord is within normal limits. No focal intra medullary lesion. There is prominent epidural lipomatosis within the dorsal epidural space extending from T3-4 through T9-10. Thoracic spinal cord is displaced anteriorly at these levels.  Paraspinous soft tissues are within normal limits. Mild atelectatic changes noted within the visualized lungs.  At T4-5, there is a small left paracentral/foraminal disc protrusion partially effacing the left ventral thecal sac and flattening the left hemi cord without cord signal changes (series 15, image 14). No significant canal or foraminal narrowing.  At T6-7, there is a tiny right paracentral disc protrusion minimally indenting the ventral thecal sac without  significant stenosis (series 15, image 20).  At T7-8, a small right paracentral disc protrusion flattens the right ventral thecal sac with mild flattening of the right hemi cord. No cord signal changes or significant stenosis.  At T8-9, tiny left paracentral disc protrusion without significant stenosis (series 16, image 26).  At T9-10, there is prominent bilateral facet arthrosis without significant stenosis. Minimal disc bulge at this level.  At T10-11, mild bilateral facet arthrosis without significant stenosis.  IMPRESSION: MRI CERVICAL SPINE IMPRESSION:  1. Status post ACDF at C4-5 and V5-6 without complication. 2. 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 Mild superimposed degenerative spondylolysis as above.  No cord signal changes identified. 3. Small vessel type changes within the partially visualized pons.  MRI THORACIC SPINE IMPRESSION:  1. Mild multilevel degenerative spondylolysis as detailed above without significant canal or foraminal stenosis. 2. Sequelae of prior vertebral augmentation within the T12 vertebral body. 3. Prominent epidural lipomatosis within the dorsal epidural space extending from T3-4 through T9-10.   Electronically Signed   By: Jeannine Boga M.D.   On: 05/07/2015 03:13   Mr Thoracic Spine Wo Contrast  05/07/2015   CLINICAL DATA:  Initial evaluation for persistent left leg pain, weakness, swelling.  EXAM: MRI CERVICAL AND LUMBAR SPINE WITHOUT CONTRAST  TECHNIQUE: Multiplanar and multiecho pulse sequences of the cervical spine, to include the craniocervical junction and cervicothoracic junction, and lumbar spine, were obtained without intravenous contrast.  COMPARISON:  None.  FINDINGS: MRI CERVICAL SPINE FINDINGS  Patchy T2 hyperdensity within the partially visualized pons noted, which may be related chronic small vessel ischemic disease. Brainstem and cervical spinal cord are  mildly atrophic in appearance. Mild cerebellar atrophy. Craniocervical junction widely patent.  Mild straightening with slight reversal of the normal cervical lordosis with apex at C5. No listhesis. Patient is status post ACDF at C4-5 and C6-7. Hardware appears well positioned. Signal intensity within the vertebral body bone marrow is normal. No focal osseous lesion. No marrow edema.  Signal intensity within the cervical spinal cord is normal. No focal intra medullary lesions.  Paraspinous soft tissues are unremarkable. Normal intravascular flow voids present within the vertebral arteries bilaterally. No prevertebral edema.  C2-3: Broad-based posterior disc bulge mildly effaces the ventral thecal sac with resultant mild canal narrowing. Foramina remain widely patent.  C3-4: Mild diffuse degenerative disc osteophyte with mild bilateral uncovertebral hypertrophy. A superimposed central/right paracentral shallow disc protrusion indents the ventral thecal sac and contacts the cervical spinal cord. There is resultant moderate canal stenosis. Cervical spinal cord is mildly flattened without cord signal changes. There is mild left foraminal narrowing without significant right foraminal stenosis.  C4-5: Status post ACDF. Moderate canal stenosis. Mild left-sided uncovertebral hypertrophy with resultant mild to moderate left foraminal narrowing. No significant right foraminal stenosis.  C5-6: Mild diffuse degenerative disc osteophyte with right-sided facet arthrosis. Posterior disc osteophyte partially effaces the ventral thecal sac and results in moderate canal stenosis. There is moderate right foraminal narrowing without significant left foraminal stenosis.  C6-7: Status post ACDF. Moderate canal narrowing. Uncovertebral and facet hypertrophy present bilaterally, left greater than right. There is resultant moderate bilateral foraminal narrowing, left greater than right.  C7-T1: Mild right-sided uncovertebral hypertrophy  with resultant mild right foraminal narrowing. Left neural foramen remains widely patent. Minimal posterior disc osteophyte without significant canal stenosis.  MRI THORACIC SPINE FINDINGS  Vertebral bodies are normally aligned with preservation of the normal thoracic kyphosis.  Sequelae of prior T12 kyphoplasty present. There is mild chronic anterior height loss at the superior endplate at this level.  Otherwise, vertebral body heights error fairly well-maintained. There are scattered chronic degenerative endplate Schmorl's nodes throughout the mid and lower thoracic spine, most prevalent about the T6-7 intervertebral disc space an the inferior endplate of T8 and T9. Fatty degenerative endplate changes present at the superior endplate of T8. Diffuse disc desiccation present.  Bone marrow signal intensity within normal limits. No focal osseous lesion. No marrow edema.  Signal intensity within the thoracic spinal cord is within normal limits. No focal intra medullary lesion. There is prominent epidural lipomatosis within the dorsal epidural space extending from T3-4 through T9-10. Thoracic  spinal cord is displaced anteriorly at these levels.  Paraspinous soft tissues are within normal limits. Mild atelectatic changes noted within the visualized lungs.  At T4-5, there is a small left paracentral/foraminal disc protrusion partially effacing the left ventral thecal sac and flattening the left hemi cord without cord signal changes (series 15, image 14). No significant canal or foraminal narrowing.  At T6-7, there is a tiny right paracentral disc protrusion minimally indenting the ventral thecal sac without significant stenosis (series 15, image 20).  At T7-8, a small right paracentral disc protrusion flattens the right ventral thecal sac with mild flattening of the right hemi cord. No cord signal changes or significant stenosis.  At T8-9, tiny left paracentral disc protrusion without significant stenosis (series 16,  image 26).  At T9-10, there is prominent bilateral facet arthrosis without significant stenosis. Minimal disc bulge at this level.  At T10-11, mild bilateral facet arthrosis without significant stenosis.  IMPRESSION: MRI CERVICAL SPINE IMPRESSION:  1. Status post ACDF at C4-5 and J5-0 without complication. 2. 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 Mild superimposed degenerative spondylolysis as above. No cord signal changes identified. 3. Small vessel type changes within the partially visualized pons.  MRI THORACIC SPINE IMPRESSION:  1. Mild multilevel degenerative spondylolysis as detailed above without significant canal or foraminal stenosis. 2. Sequelae of prior vertebral augmentation within the T12 vertebral body. 3. Prominent epidural lipomatosis within the dorsal epidural space extending from T3-4 through T9-10.   Electronically Signed   By: Jeannine Boga M.D.   On: 05/07/2015 03:13   Mr Lumbar Spine Wo Contrast  05/05/2015   CLINICAL DATA:  Worsening left lower extremity weakness.  EXAM: MRI LUMBAR SPINE WITHOUT CONTRAST  TECHNIQUE: Multiplanar, multisequence MR imaging of the lumbar spine was performed. No intravenous contrast was administered.  COMPARISON:  03/01/2015  FINDINGS: Vertebral alignment is within normal limits. Prior vertebral augmentation is again noted at T12. Lumbar vertebral body heights are preserved. No vertebral marrow edema is seen. Vertebral body heights are relatively well preserved. Conus medullaris is normal in signal and terminates at the inferior aspect of L1. Tiny right renal cyst is again noted.  L1-2 and L2-3:  Negative.  L3-4: Minimal disc bulging and mild facet hypertrophy without stenosis, unchanged.  L4-5: Mild disc bulging asymmetric to the left and mild facet hypertrophy result in minimal left lateral recess narrowing, unchanged. No spinal canal or neural foraminal  stenosis.  L5-S1: Shallow left foraminal disc protrusion and mild facet hypertrophy without stenosis, unchanged.  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 No clear neural impingement.   Electronically Signed   By: Logan Bores   On: 05/05/2015 19:13   Mr Hip Left Wo Contrast  05/07/2015   CLINICAL DATA:  Low back pain for 2-3 months which has worsened over the past week. Left leg weakness. No known injury. Initial encounter.  EXAM: MR OF THE LEFT HIP WITHOUT CONTRAST  TECHNIQUE: Multiplanar, multisequence MR imaging was performed. No intravenous contrast was administered.  COMPARISON:  CT abdomen and pelvis 03/04/2015.  FINDINGS: Bones: Marrow signal is normal in the femoral heads bilaterally without fracture or avascular necrosis. The pelvis and visualized femurs also demonstrate normal signal.  Articular cartilage and labrum  Articular cartilage:  Unremarkable.  Labrum:  Intact.  Joint or bursal effusion  Joint effusion:  None.  Bursae:  Unremarkable.  Muscles and tendons  Muscles  and tendons:  Intact and unremarkable in appearance.  Other findings  Miscellaneous: The prostate gland is enlarged. Fat containing inguinal hernias are seen, larger on the left.  IMPRESSION: Normal-appearing pelvis and hips. No finding to explain the patient's symptoms.  Prostatomegaly.   Electronically Signed   By: Inge Rise M.D.   On: 05/07/2015 13:49    CBC  Recent Labs Lab 05/05/15 1657 05/06/15 0318  WBC 11.0* 9.5  HGB 14.4 12.8*  HCT 43.4 38.6*  PLT 191 175  MCV 76.5* 76.6*  MCH 25.4* 25.4*  MCHC 33.2 33.2  RDW 17.9* 17.9*  LYMPHSABS 2.0  --   MONOABS 0.7  --   EOSABS 0.3  --   BASOSABS 0.0  --     Chemistries   Recent Labs Lab 05/05/15 1657 05/06/15 0318  NA 138 135  K 3.9 4.7  CL 101 101  CO2 21* 24  GLUCOSE 286* 451*  BUN 13 16  CREATININE 1.11 1.42*  CALCIUM 8.8* 8.5*    ------------------------------------------------------------------------------------------------------------------ estimated creatinine clearance is 65.4 mL/min (by C-G formula based on Cr of 1.42). ------------------------------------------------------------------------------------------------------------------  Recent Labs  05/06/15 1305  HGBA1C 9.1*   ------------------------------------------------------------------------------------------------------------------ No results for input(s): CHOL, HDL, LDLCALC, TRIG, CHOLHDL, LDLDIRECT in the last 72 hours. ------------------------------------------------------------------------------------------------------------------ No results for input(s): TSH, T4TOTAL, T3FREE, THYROIDAB in the last 72 hours.  Invalid input(s): FREET3 ------------------------------------------------------------------------------------------------------------------ No results for input(s): VITAMINB12, FOLATE, FERRITIN, TIBC, IRON, RETICCTPCT in the last 72 hours.  Coagulation profile No results for input(s): INR, PROTIME in the last 168 hours.  No results for input(s): DDIMER in the last 72 hours.  Cardiac Enzymes No results for input(s): CKMB, TROPONINI, MYOGLOBIN in the last 168 hours.  Invalid input(s): CK ------------------------------------------------------------------------------------------------------------------ Invalid input(s): [place]  05/06/15 1641 05/06/15 2151 05/07/15 0047 05/07/15 0518 05/07/15 0744 05/07/15 1359  GLUCAP 376* 426* 348* 368* 62* 371*     Tonnia Bardin M.D. [facility] 05/07/2015, 3:17 PM  Pager: [phone]   Between 7am to 7pm - call Pager - [phone]  After 7pm go to [email or web address] - password [email or web address]  Call night coverage person covering after 7pm

## 2015-05-08 ENCOUNTER — Observation Stay (HOSPITAL_COMMUNITY): Payer: Medicare Other

## 2015-05-08 DIAGNOSIS — M79609 Pain in unspecified limb: Secondary | ICD-10-CM

## 2015-05-08 DIAGNOSIS — M79605 Pain in left leg: Secondary | ICD-10-CM

## 2015-05-08 DIAGNOSIS — M549 Dorsalgia, unspecified: Secondary | ICD-10-CM | POA: Diagnosis not present

## 2015-05-08 DIAGNOSIS — G8929 Other chronic pain: Secondary | ICD-10-CM | POA: Diagnosis not present

## 2015-05-08 DIAGNOSIS — M542 Cervicalgia: Secondary | ICD-10-CM | POA: Diagnosis not present

## 2015-05-08 DIAGNOSIS — I2510 Atherosclerotic heart disease of native coronary artery without angina pectoris: Secondary | ICD-10-CM | POA: Diagnosis not present

## 2015-05-08 DIAGNOSIS — T82897S Other specified complication of cardiac prosthetic devices, implants and grafts, sequela: Secondary | ICD-10-CM | POA: Diagnosis not present

## 2015-05-08 LAB — BASIC METABOLIC PANEL
ANION GAP: 9 (ref 5–15)
BUN: 27 mg/dL — ABNORMAL HIGH (ref 6–20)
CHLORIDE: 100 mmol/L — AB (ref 101–111)
CO2: 28 mmol/L (ref 22–32)
Calcium: 9.1 mg/dL (ref 8.9–10.3)
Creatinine, Ser: 1.52 mg/dL — ABNORMAL HIGH (ref 0.61–1.24)
GFR calc non Af Amer: 45 mL/min — ABNORMAL LOW (ref >60)
GFR, EST AFRICAN AMERICAN: 52 mL/min — AB (ref >60)
GLUCOSE: 339 mg/dL — AB (ref 65–99)
Potassium: 4.2 mmol/L (ref 3.5–5.1)
SODIUM: 137 mmol/L (ref 135–145)

## 2015-05-08 LAB — GLUCOSE, CAPILLARY
GLUCOSE-CAPILLARY: 364 mg/dL — AB (ref 65–99)
Glucose-Capillary: 311 mg/dL — ABNORMAL HIGH (ref 65–99)
Glucose-Capillary: 425 mg/dL — ABNORMAL HIGH (ref 65–99)
Glucose-Capillary: 345 mg/dL — ABNORMAL HIGH (ref 65–99)

## 2015-05-08 LAB — CBC
HCT: 40.2 % (ref 39.0–52.0)
Hemoglobin: 13.8 g/dL (ref 13.0–17.0)
MCH: 25.7 pg — AB (ref 26.0–34.0)
MCHC: 34.3 g/dL (ref 30.0–36.0)
MCV: 75 fL — ABNORMAL LOW (ref 78.0–100.0)
PLATELETS: 185 10*3/uL (ref 150–400)
RBC: 5.36 MIL/uL (ref 4.22–5.81)
RDW: 17.9 % — ABNORMAL HIGH (ref 11.5–15.5)
WBC: 11.6 10*3/uL — ABNORMAL HIGH (ref 4.0–10.5)

## 2015-05-08 LAB — PLATELET INHIBITION P2Y12: Platelet Function  P2Y12: 185 [PRU] — ABNORMAL LOW (ref 194–418)

## 2015-05-08 MED ORDER — HYDROCODONE-ACETAMINOPHEN 5-325 MG PO TABS
1.0000 | ORAL_TABLET | ORAL | Status: DC | PRN
  Administered 2015-05-09: 1 via ORAL
  Filled 2015-05-08 (×2): qty 1

## 2015-05-08 MED ORDER — DOCUSATE SODIUM 100 MG PO CAPS
100.0000 mg | ORAL_CAPSULE | Freq: Two times a day (BID) | ORAL | Status: DC
  Administered 2015-05-08 – 2015-05-09 (×3): 100 mg via ORAL
  Filled 2015-05-08 (×3): qty 1

## 2015-05-08 MED ORDER — FENTANYL 25 MCG/HR TD PT72
25.0000 ug | MEDICATED_PATCH | TRANSDERMAL | Status: DC
  Administered 2015-05-08: 25 ug via TRANSDERMAL
  Filled 2015-05-08: qty 1

## 2015-05-08 MED ORDER — POLYETHYLENE GLYCOL 3350 17 G PO PACK
17.0000 g | PACK | Freq: Every day | ORAL | Status: DC
  Administered 2015-05-08 – 2015-05-09 (×2): 17 g via ORAL
  Filled 2015-05-08 (×2): qty 1

## 2015-05-08 MED ORDER — INSULIN ASPART 100 UNIT/ML ~~LOC~~ SOLN
20.0000 [IU] | Freq: Once | SUBCUTANEOUS | Status: AC
  Administered 2015-05-08: 20 [IU] via SUBCUTANEOUS

## 2015-05-08 MED ORDER — INSULIN ASPART 100 UNIT/ML ~~LOC~~ SOLN
10.0000 [IU] | Freq: Three times a day (TID) | SUBCUTANEOUS | Status: DC
  Administered 2015-05-08 – 2015-05-09 (×2): 10 [IU] via SUBCUTANEOUS

## 2015-05-08 MED ORDER — INSULIN GLARGINE 100 UNIT/ML ~~LOC~~ SOLN
60.0000 [IU] | Freq: Two times a day (BID) | SUBCUTANEOUS | Status: DC
  Administered 2015-05-08: 60 [IU] via SUBCUTANEOUS
  Filled 2015-05-08 (×3): qty 0.6

## 2015-05-08 MED ORDER — INSULIN GLARGINE 100 UNIT/ML ~~LOC~~ SOLN
58.0000 [IU] | Freq: Two times a day (BID) | SUBCUTANEOUS | Status: DC
  Filled 2015-05-08: qty 0.58

## 2015-05-08 MED ORDER — CLOPIDOGREL BISULFATE 75 MG PO TABS
75.0000 mg | ORAL_TABLET | Freq: Every day | ORAL | Status: DC
  Administered 2015-05-08 – 2015-05-09 (×2): 75 mg via ORAL
  Filled 2015-05-08 (×2): qty 1

## 2015-05-08 NOTE — Progress Notes (Signed)
[facility]                                                                              Patient Demographics  Steve Steve, is a 69 y.o. male, DOB - February 25, 1946, QMV:[id number]  Admit date - 05/05/2015   Admitting Physician Steve Grout, MD  Outpatient Primary MD for the patient is Steve Deter, MD  LOS -    Chief Complaint  Patient presents with  . Back Pain       Brief HPI   Patient is a 69 year old male with CAD, recent stenting of LAD with DES in April 2016, poorly controlled diabetes medicines, Parkinson's, presented with severe lower back pain for the last 1 week. Patient reported chronic back pain since April of this year. Patient had 3 steroidal injections, oral prednisone. Patient noted that the pain continued to get worse and has been unbearable for last several days despite the home pain medications. He denied any fevers, states that pain radiates to left leg and feels weaker associated with back pain. No saddle anesthesia or urinary or bowel incontinence. Patient also reported that he has been unable to walk in the last 2 days due to severity of pain. Patient had seen neurosurgery who did the cervical fusion surgery years ago who wanted to obtain a myelogram for which patient had to be off the Plavix however cardiology had recommended not to stop the Plavix due to recent stenting of the LAD. Patient was admitted for intractable back pain.   Assessment & Plan    Principal Problem:   Intractable back pain and left leg pain - Patient underwent MRI of the lumbar spine which showed mild lumbar disc degeneration and facet hypertrophy without interval change, no clear neural impingement - Continue Neurontin, Lyrica, pain control with IV Dilaudid, Norco - MRI C spine, T spine and hip all unremarkable - Doppler US LE area for DVT, ABI pending, ABI 0.96 on the left and 0.97 on the right, not enough to show arterial insufficiency as the cause of his intractable  left leg pain. I discussed with vascular surgery, Dr. Scot Phillips who will also evaluate. - dc prednisone and Robaxin, per wife became confused last night - I recommended patient to get up with physical therapy today, increase ambulation   Active Problems:   CAD (coronary artery disease) - Continue aspirin, Plavix, amlodipine, lisinopril, HCTZ - Restart Plavix has no plans of surgery    Hypertension - Currently stable, continue amlodipine, Coreg, Lasix, lisinopril, HCTZ    DM (diabetes mellitus), type 2 with neurological complications: Uncontrolled due to prednisone - hemoglobin A1c 9.1, placed on Lantus, sliding scale insulin, meal coverage - Increased meal coverage to 10 TID, lantus to 58units      Obesity-BMI 35 - Patient was counseled on diet and weight control  Constipation Add Colace and MiraLAX  Code status: Full code  Family communication Discussed in detail in detail with the patient, all imaging results, lab results explained to the patient and his wife at the bedside.   Disposition Plan: Patient and family, overwhelmed with situation, do not feel that he will be able to manage at  home. Recommended to start physical therapy and will be able to ascertain the functional status.  TimeSpent in minutes 25 minutes  Procedures  MRI lumbar spine   Consults   Neurosurgery Cardiology  DVT Prophylaxis   SCD's  Medications  Scheduled Meds: . amLODipine  5 mg Oral Daily  . aspirin EC  81 mg Oral TID  . carbidopa-levodopa  1 tablet Oral TID  . carvedilol  25 mg Oral BID  . clopidogrel  75 mg Oral Daily  . gabapentin  300 mg Oral QHS  . hydrochlorothiazide  12.5 mg Oral Daily   And  . lisinopril  20 mg Oral Daily  . insulin aspart  0-20 Units Subcutaneous TID WC  . insulin aspart  0-5 Units Subcutaneous QHS  . insulin aspart  6 Units Subcutaneous TID WC  . insulin glargine  55 Units Subcutaneous BID  . methocarbamol  500 mg Oral QID  . pantoprazole  40 mg Oral BID  .  pregabalin  150 mg Oral BH-q7a  . simvastatin  20 mg Oral QHS  . sodium chloride  3 mL Intravenous Q12H   Continuous Infusions:  PRN Meds:.sodium chloride, acetaminophen, hydrALAZINE, HYDROcodone-acetaminophen, HYDROmorphone (DILAUDID) injection, nitroGLYCERIN, ondansetron **OR** ondansetron (ZOFRAN) IV, sodium chloride   Antibiotics   Anti-infectives    None        Subjective:   Steve Steve was seen and examined today. Continues to complain of left leg pain intermittently especially on movement. Controlled with the pain medications. Wife at the bedside frustrated over every imaging unremarkable or negative. Patient denies dizziness, chest pain, shortness of breath, abdominal pain, N/V/D/C. No acute events overnight.    Objective:   Blood pressure 156/74, pulse 70, temperature 97.8 F (36.6 C), temperature source Oral, resp. rate 22, height 6' (1.829 m), weight 122.29 kg (269 lb 9.6 oz), SpO2 100 %.  Wt Readings from Last 3 Encounters:  05/07/15 122.29 kg (269 lb 9.6 oz)  04/05/15 115.667 kg (255 lb)  04/04/15 117.141 kg (258 lb 4 oz)     Intake/Output Summary (Last 24 hours) at 05/08/15 1252 Last data filed at 05/08/15 0400  Gross per 24 hour  Intake    480 ml  Output   2000 ml  Net  -1520 ml    Exam  General: Alert and oriented x 3, NAD  HEENT:  PERRLA, EOMI   Neck: Supple, no JVD  CVS: S1 S2 clear, RRR   Respiratory: CTAB  Abdomen: Soft, nontender, nondistended, + bowel sounds  Ext: no cyanosis clubbing or edema  Neuro: no new deficits  Skin: No rashes  Psych: Normal affect and demeanor, alert and oriented x3    Data Review   Micro Results No results found for this or any previous visit (from the past 240 hour(s)).  Radiology Reports Dg Pelvis 1-2 Views  05/06/2015   CLINICAL DATA:  Left hip and pelvic pain for 3 weeks without known injury. Initial encounter.  EXAM: PELVIS - 1-2 VIEW  COMPARISON:  None.  FINDINGS: There is no evidence of pelvic  fracture or diastasis. No pelvic bone lesions are seen. Hip and sacroiliac joints appear normal.  IMPRESSION: Normal pelvis.   Electronically Signed   By: Marijo Conception, M.D.   On: 05/06/2015 16:59   Mr Cervical Spine Wo Contrast  05/07/2015   CLINICAL DATA:  Initial evaluation for persistent left leg pain, weakness, swelling.  EXAM: MRI CERVICAL AND LUMBAR SPINE WITHOUT CONTRAST  TECHNIQUE: Multiplanar and multiecho pulse sequences  of the cervical spine, to include the craniocervical junction and cervicothoracic junction, and lumbar spine, were obtained without intravenous contrast.  COMPARISON:  None.  FINDINGS: MRI CERVICAL SPINE FINDINGS  Patchy T2 hyperdensity within the partially visualized pons noted, which may be related chronic small vessel ischemic disease. Brainstem and cervical spinal cord are mildly atrophic in appearance. Mild cerebellar atrophy. Craniocervical junction widely patent.  Mild straightening with slight reversal of the normal cervical lordosis with apex at C5. No listhesis. Patient is status post ACDF at C4-5 and C6-7. Hardware appears well positioned. Signal intensity within the vertebral body bone marrow is normal. No focal osseous lesion. No marrow edema.  Signal intensity within the cervical spinal cord is normal. No focal intra medullary lesions.  Paraspinous soft tissues are unremarkable. Normal intravascular flow voids present within the vertebral arteries bilaterally. No prevertebral edema.  C2-3: Broad-based posterior disc bulge mildly effaces the ventral thecal sac with resultant mild canal narrowing. Foramina remain widely patent.  C3-4: Mild diffuse degenerative disc osteophyte with mild bilateral uncovertebral hypertrophy. A superimposed central/right paracentral shallow disc protrusion indents the ventral thecal sac and contacts the cervical spinal cord. There is resultant moderate canal stenosis. Cervical spinal cord is mildly flattened without cord signal changes.  There is mild left foraminal narrowing without significant right foraminal stenosis.  C4-5: Status post ACDF. Moderate canal stenosis. Mild left-sided uncovertebral hypertrophy with resultant mild to moderate left foraminal narrowing. No significant right foraminal stenosis.  C5-6: Mild diffuse degenerative disc osteophyte with right-sided facet arthrosis. Posterior disc osteophyte partially effaces the ventral thecal sac and results in moderate canal stenosis. There is moderate right foraminal narrowing without significant left foraminal stenosis.  C6-7: Status post ACDF. Moderate canal narrowing. Uncovertebral and facet hypertrophy present bilaterally, left greater than right. There is resultant moderate bilateral foraminal narrowing, left greater than right.  C7-T1: Mild right-sided uncovertebral hypertrophy with resultant mild right foraminal narrowing. Left neural foramen remains widely patent. Minimal posterior disc osteophyte without significant canal stenosis.  MRI THORACIC SPINE FINDINGS  Vertebral bodies are normally aligned with preservation of the normal thoracic kyphosis.  Sequelae of prior T12 kyphoplasty present. There is mild chronic anterior height loss at the superior endplate at this level.  Otherwise, vertebral body heights error fairly well-maintained. There are scattered chronic degenerative endplate Schmorl's nodes throughout the mid and lower thoracic spine, most prevalent about the T6-7 intervertebral disc space an the inferior endplate of T8 and T9. Fatty degenerative endplate changes present at the superior endplate of T8. Diffuse disc desiccation present.  Bone marrow signal intensity within normal limits. No focal osseous lesion. No marrow edema.  Signal intensity within the thoracic spinal cord is within normal limits. No focal intra medullary lesion. There is prominent epidural lipomatosis within the dorsal epidural space extending from T3-4 through T9-10. Thoracic spinal cord is  displaced anteriorly at these levels.  Paraspinous soft tissues are within normal limits. Mild atelectatic changes noted within the visualized lungs.  At T4-5, there is a small left paracentral/foraminal disc protrusion partially effacing the left ventral thecal sac and flattening the left hemi cord without cord signal changes (series 15, image 14). No significant canal or foraminal narrowing.  At T6-7, there is a tiny right paracentral disc protrusion minimally indenting the ventral thecal sac without significant stenosis (series 15, image 20).  At T7-8, a small right paracentral disc protrusion flattens the right ventral thecal sac with mild flattening of the right hemi cord. No cord signal changes or significant  stenosis.  At T8-9, tiny left paracentral disc protrusion without significant stenosis (series 16, image 26).  At T9-10, there is prominent bilateral facet arthrosis without significant stenosis. Minimal disc bulge at this level.  At T10-11, mild bilateral facet arthrosis without significant stenosis.  IMPRESSION: MRI CERVICAL SPINE IMPRESSION:  1. Status post ACDF at C4-5 and O7-5 without complication. 2. 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 Mild superimposed degenerative spondylolysis as above. No cord signal changes identified. 3. Small vessel type changes within the partially visualized pons.  MRI THORACIC SPINE IMPRESSION:  1. Mild multilevel degenerative spondylolysis as detailed above without significant canal or foraminal stenosis. 2. Sequelae of prior vertebral augmentation within the T12 vertebral body. 3. Prominent epidural lipomatosis within the dorsal epidural space extending from T3-4 through T9-10.   Electronically Signed   By: Jeannine Boga M.D.   On: 05/07/2015 03:13   Mr Thoracic Spine Wo Contrast  05/07/2015   CLINICAL DATA:  Initial evaluation for persistent left leg pain,  weakness, swelling.  EXAM: MRI CERVICAL AND LUMBAR SPINE WITHOUT CONTRAST  TECHNIQUE: Multiplanar and multiecho pulse sequences of the cervical spine, to include the craniocervical junction and cervicothoracic junction, and lumbar spine, were obtained without intravenous contrast.  COMPARISON:  None.  FINDINGS: MRI CERVICAL SPINE FINDINGS  Patchy T2 hyperdensity within the partially visualized pons noted, which may be related chronic small vessel ischemic disease. Brainstem and cervical spinal cord are mildly atrophic in appearance. Mild cerebellar atrophy. Craniocervical junction widely patent.  Mild straightening with slight reversal of the normal cervical lordosis with apex at C5. No listhesis. Patient is status post ACDF at C4-5 and C6-7. Hardware appears well positioned. Signal intensity within the vertebral body bone marrow is normal. No focal osseous lesion. No marrow edema.  Signal intensity within the cervical spinal cord is normal. No focal intra medullary lesions.  Paraspinous soft tissues are unremarkable. Normal intravascular flow voids present within the vertebral arteries bilaterally. No prevertebral edema.  C2-3: Broad-based posterior disc bulge mildly effaces the ventral thecal sac with resultant mild canal narrowing. Foramina remain widely patent.  C3-4: Mild diffuse degenerative disc osteophyte with mild bilateral uncovertebral hypertrophy. A superimposed central/right paracentral shallow disc protrusion indents the ventral thecal sac and contacts the cervical spinal cord. There is resultant moderate canal stenosis. Cervical spinal cord is mildly flattened without cord signal changes. There is mild left foraminal narrowing without significant right foraminal stenosis.  C4-5: Status post ACDF. Moderate canal stenosis. Mild left-sided uncovertebral hypertrophy with resultant mild to moderate left foraminal narrowing. No significant right foraminal stenosis.  C5-6: Mild diffuse degenerative disc  osteophyte with right-sided facet arthrosis. Posterior disc osteophyte partially effaces the ventral thecal sac and results in moderate canal stenosis. There is moderate right foraminal narrowing without significant left foraminal stenosis.  C6-7: Status post ACDF. Moderate canal narrowing. Uncovertebral and facet hypertrophy present bilaterally, left greater than right. There is resultant moderate bilateral foraminal narrowing, left greater than right.  C7-T1: Mild right-sided uncovertebral hypertrophy with resultant mild right foraminal narrowing. Left neural foramen remains widely patent. Minimal posterior disc osteophyte without significant canal stenosis.  MRI THORACIC SPINE FINDINGS  Vertebral bodies are normally aligned with preservation of the normal thoracic kyphosis.  Sequelae of prior T12 kyphoplasty present. There is mild chronic anterior height loss at the superior endplate at this level.  Otherwise, vertebral body heights error fairly well-maintained. There are scattered chronic degenerative endplate  Schmorl's nodes throughout the mid and lower thoracic spine, most prevalent about the T6-7 intervertebral disc space an the inferior endplate of T8 and T9. Fatty degenerative endplate changes present at the superior endplate of T8. Diffuse disc desiccation present.  Bone marrow signal intensity within normal limits. No focal osseous lesion. No marrow edema.  Signal intensity within the thoracic spinal cord is within normal limits. No focal intra medullary lesion. There is prominent epidural lipomatosis within the dorsal epidural space extending from T3-4 through T9-10. Thoracic spinal cord is displaced anteriorly at these levels.  Paraspinous soft tissues are within normal limits. Mild atelectatic changes noted within the visualized lungs.  At T4-5, there is a small left paracentral/foraminal disc protrusion partially effacing the left ventral thecal sac and flattening the left hemi cord without cord  signal changes (series 15, image 14). No significant canal or foraminal narrowing.  At T6-7, there is a tiny right paracentral disc protrusion minimally indenting the ventral thecal sac without significant stenosis (series 15, image 20).  At T7-8, a small right paracentral disc protrusion flattens the right ventral thecal sac with mild flattening of the right hemi cord. No cord signal changes or significant stenosis.  At T8-9, tiny left paracentral disc protrusion without significant stenosis (series 16, image 26).  At T9-10, there is prominent bilateral facet arthrosis without significant stenosis. Minimal disc bulge at this level.  At T10-11, mild bilateral facet arthrosis without significant stenosis.  IMPRESSION: MRI CERVICAL SPINE IMPRESSION:  1. Status post ACDF at C4-5 and J0-9 without complication. 2. 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 Mild superimposed degenerative spondylolysis as above. No cord signal changes identified. 3. Small vessel type changes within the partially visualized pons.  MRI THORACIC SPINE IMPRESSION:  1. Mild multilevel degenerative spondylolysis as detailed above without significant canal or foraminal stenosis. 2. Sequelae of prior vertebral augmentation within the T12 vertebral body. 3. Prominent epidural lipomatosis within the dorsal epidural space extending from T3-4 through T9-10.   Electronically Signed   By: Jeannine Boga M.D.   On: 05/07/2015 03:13   Mr Lumbar Spine Wo Contrast  05/05/2015   CLINICAL DATA:  Worsening left lower extremity weakness.  EXAM: MRI LUMBAR SPINE WITHOUT CONTRAST  TECHNIQUE: Multiplanar, multisequence MR imaging of the lumbar spine was performed. No intravenous contrast was administered.  COMPARISON:  03/01/2015  FINDINGS: Vertebral alignment is within normal limits. Prior vertebral augmentation is again noted at T12. Lumbar vertebral body  heights are preserved. No vertebral marrow edema is seen. Vertebral body heights are relatively well preserved. Conus medullaris is normal in signal and terminates at the inferior aspect of L1. Tiny right renal cyst is again noted.  L1-2 and L2-3:  Negative.  L3-4: Minimal disc bulging and mild facet hypertrophy without stenosis, unchanged.  L4-5: Mild disc bulging asymmetric to the left and mild facet hypertrophy result in minimal left lateral recess narrowing, unchanged. No spinal canal or neural foraminal stenosis.  L5-S1: Shallow left foraminal disc protrusion and mild facet hypertrophy without stenosis, unchanged.  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 No clear neural impingement.   Electronically Signed   By: Logan Bores   On: 05/05/2015 19:13   Mr Hip Left Wo Contrast  05/07/2015   CLINICAL DATA:  Low back pain for 2-3 months which has worsened over the past week. Left leg weakness. No known injury. Initial encounter.  EXAM:  MR OF THE LEFT HIP WITHOUT CONTRAST  TECHNIQUE: Multiplanar, multisequence MR imaging was performed. No intravenous contrast was administered.  COMPARISON:  CT abdomen and pelvis 03/04/2015.  FINDINGS: Bones: Marrow signal is normal in the femoral heads bilaterally without fracture or avascular necrosis. The pelvis and visualized femurs also demonstrate normal signal.  Articular cartilage and labrum  Articular cartilage:  Unremarkable.  Labrum:  Intact.  Joint or bursal effusion  Joint effusion:  None.  Bursae:  Unremarkable.  Muscles and tendons  Muscles and tendons:  Intact and unremarkable in appearance.  Other findings  Miscellaneous: The prostate gland is enlarged. Fat containing inguinal hernias are seen, larger on the left.  IMPRESSION: Normal-appearing pelvis and hips. No finding to explain the patient's symptoms.  Prostatomegaly.   Electronically Signed   By: Inge Rise M.D.   On: 05/07/2015 13:49    CBC  Recent  Labs Lab 05/05/15 1657 05/06/15 0318 05/08/15 0515  WBC 11.0* 9.5 11.6*  HGB 14.4 12.8* 13.8  HCT 43.4 38.6* 40.2  PLT 191 175 185  MCV 76.5* 76.6* 75.0*  MCH 25.4* 25.4* 25.7*  MCHC 33.2 33.2 34.3  RDW 17.9* 17.9* 17.9*  LYMPHSABS 2.0  --   --   MONOABS 0.7  --   --   EOSABS 0.3  --   --   BASOSABS 0.0  --   --     Chemistries   Recent Labs Lab 05/05/15 1657 05/06/15 0318 05/08/15 0515  NA 138 135 137  K 3.9 4.7 4.2  CL 101 101 100*  CO2 21* 24 28  GLUCOSE 286* 451* 339*  BUN 13 16 27*  CREATININE 1.11 1.42* 1.52*  CALCIUM 8.8* 8.5* 9.1   ------------------------------------------------------------------------------------------------------------------ estimated creatinine clearance is 61.1 mL/min (by C-G formula based on Cr of 1.52). ------------------------------------------------------------------------------------------------------------------  Recent Labs  05/06/15 1305  HGBA1C 9.1*   ------------------------------------------------------------------------------------------------------------------ No results for input(s): CHOL, HDL, LDLCALC, TRIG, CHOLHDL, LDLDIRECT in the last 72 hours. ------------------------------------------------------------------------------------------------------------------ No results for input(s): TSH, T4TOTAL, T3FREE, THYROIDAB in the last 72 hours.  Invalid input(s): FREET3 ------------------------------------------------------------------------------------------------------------------ No results for input(s): VITAMINB12, FOLATE, FERRITIN, TIBC, IRON, RETICCTPCT in the last 72 hours.  Coagulation profile No results for input(s): INR, PROTIME in the last 168 hours.  No results for input(s): DDIMER in the last 72 hours.  Cardiac Enzymes No results for input(s): CKMB, TROPONINI, MYOGLOBIN in the last 168 hours.  Invalid input(s):  CK ------------------------------------------------------------------------------------------------------------------ Invalid input(s): [place]  05/07/15 0744 05/07/15 1359 05/07/15 1659 05/07/15 2124 05/08/15 0728 05/08/15 1159  GLUCAP 420* 371* 407* 384* 311* 364*     Taheera Thomann M.D. [facility] 05/08/2015, 12:52 PM  Pager: [phone]   Between 7am to 7pm - call Pager - [phone]  After 7pm go to [email or web address] - password [email or web address]  Call night coverage person covering after 7pm

## 2015-05-08 NOTE — Progress Notes (Signed)
Patient ID: Steve Phillips, male   DOB: 12/12/1945, 69 y.o.   MRN: [id number]    Patient Name: Steve Phillips Date of Encounter: 05/08/2015     Principal Problem:   Intractable back pain Active Problems:   CAD (coronary artery disease)   Hypertension   DM (diabetes mellitus), type 2 with neurological complications   Gait abnormality   Obesity-BMI 35   Coronary stent occlusion s/p DES restenting of LAD 4/16   Cervicalgia   Hip pain    SUBJECTIVE  Back pain improved.  CURRENT MEDS . amLODipine  5 mg Oral Daily  . aspirin EC  81 mg Oral TID  . carbidopa-levodopa  1 tablet Oral TID  . carvedilol  25 mg Oral BID  . clopidogrel  75 mg Oral Daily  . gabapentin  300 mg Oral QHS  . hydrochlorothiazide  12.5 mg Oral Daily   And  . lisinopril  20 mg Oral Daily  . insulin aspart  0-20 Units Subcutaneous TID WC  . insulin aspart  0-5 Units Subcutaneous QHS  . insulin aspart  6 Units Subcutaneous TID WC  . insulin glargine  55 Units Subcutaneous BID  . methocarbamol  500 mg Oral QID  . pantoprazole  40 mg Oral BID  . pregabalin  150 mg Oral BH-q7a  . simvastatin  20 mg Oral QHS  . sodium chloride  3 mL Intravenous Q12H    OBJECTIVE  Filed Vitals:   05/07/15 0600 05/07/15 1400 05/07/15 2121 05/08/15 0552  BP:  137/65 151/65 186/69  Pulse:  78 77 63  Temp:  99.7 F (37.6 C) 98.6 F (37 C) 97.8 F (36.6 C)  TempSrc:  Oral Axillary Oral  Resp:  18 20 22   Height:      Weight: 269 lb 9.6 oz (122.29 kg)     SpO2:  95% 95% 100%    Intake/Output Summary (Last 24 hours) at 05/08/15 1015 Last data filed at 05/08/15 0400  Gross per 24 hour  Intake    480 ml  Output   2000 ml  Net  -1520 ml   Filed Weights   05/05/15 2117 05/06/15 1300 05/07/15 0600  Weight: 161 lb (73.029 kg) 260 lb (117.935 kg) 269 lb 9.6 oz (122.29 kg)    PHYSICAL EXAM  General: Pleasant, obese,NAD. Neuro: Alert and oriented X 3. Moves all extremities spontaneously. Psych: Normal affect. HEENT:   Normal  Neck: Supple without bruits, JVD 7 cm. Lungs:  Resp regular and unlabored, CTA. Heart: RRR no s3, s4, or murmurs. Abdomen: Soft, non-tender, non-distended, BS + x 4.  Extremities: No clubbing, cyanosis or edema. DP/PT/Radials 2+ and equal bilaterally.  Accessory Clinical Findings  CBC  Recent Labs  05/05/15 1657 05/06/15 0318 05/08/15 0515  WBC 11.0* 9.5 11.6*  NEUTROABS 8.0*  --   --   HGB 14.4 12.8* 13.8  HCT 43.4 38.6* 40.2  MCV 76.5* 76.6* 75.0*  PLT 191 175 301   Basic Metabolic Panel  Recent Labs  05/06/15 0318 05/08/15 0515  NA 135 137  K 4.7 4.2  CL 101 100*  CO2 24 28  GLUCOSE 451* 339*  BUN 16 27*  CREATININE 1.42* 1.52*  CALCIUM 8.5* 9.1   Liver Function Tests No results for input(s): AST, ALT, ALKPHOS, BILITOT, PROT, ALBUMIN in the last 72 hours. No results for input(s): LIPASE, AMYLASE in the last 72 hours. Cardiac Enzymes No results for input(s): CKTOTAL, CKMB, CKMBINDEX, TROPONINI in the last 72 hours. BNP  Invalid input(s): POCBNP D-Dimer No results for input(s): DDIMER in the last 72 hours. Hemoglobin A1C  Recent Labs  05/06/15 1305  HGBA1C 9.1*   Fasting Lipid Panel No results for input(s): CHOL, HDL, LDLCALC, TRIG, CHOLHDL, LDLDIRECT in the last 72 hours. Thyroid Function Tests No results for input(s): TSH, T4TOTAL, T3FREE, THYROIDAB in the last 72 hours.  Invalid input(s): FREET3    Radiology/Studies  Dg Pelvis 1-2 Views  05/06/2015   CLINICAL DATA:  Left hip and pelvic pain for 3 weeks without known injury. Initial encounter.  EXAM: PELVIS - 1-2 VIEW  COMPARISON:  None.  FINDINGS: There is no evidence of pelvic fracture or diastasis. No pelvic bone lesions are seen. Hip and sacroiliac joints appear normal.  IMPRESSION: Normal pelvis.   Electronically Signed   By: Marijo Conception, M.D.   On: 05/06/2015 16:59   Mr Cervical Spine Wo Contrast  05/07/2015   CLINICAL DATA:  Initial evaluation for persistent left leg pain,  weakness, swelling.  EXAM: MRI CERVICAL AND LUMBAR SPINE WITHOUT CONTRAST  TECHNIQUE: Multiplanar and multiecho pulse sequences of the cervical spine, to include the craniocervical junction and cervicothoracic junction, and lumbar spine, were obtained without intravenous contrast.  COMPARISON:  None.  FINDINGS: MRI CERVICAL SPINE FINDINGS  Patchy T2 hyperdensity within the partially visualized pons noted, which may be related chronic small vessel ischemic disease. Brainstem and cervical spinal cord are mildly atrophic in appearance. Mild cerebellar atrophy. Craniocervical junction widely patent.  Mild straightening with slight reversal of the normal cervical lordosis with apex at C5. No listhesis. Patient is status post ACDF at C4-5 and C6-7. Hardware appears well positioned. Signal intensity within the vertebral body bone marrow is normal. No focal osseous lesion. No marrow edema.  Signal intensity within the cervical spinal cord is normal. No focal intra medullary lesions.  Paraspinous soft tissues are unremarkable. Normal intravascular flow voids present within the vertebral arteries bilaterally. No prevertebral edema.  C2-3: Broad-based posterior disc bulge mildly effaces the ventral thecal sac with resultant mild canal narrowing. Foramina remain widely patent.  C3-4: Mild diffuse degenerative disc osteophyte with mild bilateral uncovertebral hypertrophy. A superimposed central/right paracentral shallow disc protrusion indents the ventral thecal sac and contacts the cervical spinal cord. There is resultant moderate canal stenosis. Cervical spinal cord is mildly flattened without cord signal changes. There is mild left foraminal narrowing without significant right foraminal stenosis.  C4-5: Status post ACDF. Moderate canal stenosis. Mild left-sided uncovertebral hypertrophy with resultant mild to moderate left foraminal narrowing. No significant right foraminal stenosis.  C5-6: Mild diffuse degenerative disc  osteophyte with right-sided facet arthrosis. Posterior disc osteophyte partially effaces the ventral thecal sac and results in moderate canal stenosis. There is moderate right foraminal narrowing without significant left foraminal stenosis.  C6-7: Status post ACDF. Moderate canal narrowing. Uncovertebral and facet hypertrophy present bilaterally, left greater than right. There is resultant moderate bilateral foraminal narrowing, left greater than right.  C7-T1: Mild right-sided uncovertebral hypertrophy with resultant mild right foraminal narrowing. Left neural foramen remains widely patent. Minimal posterior disc osteophyte without significant canal stenosis.  MRI THORACIC SPINE FINDINGS  Vertebral bodies are normally aligned with preservation of the normal thoracic kyphosis.  Sequelae of prior T12 kyphoplasty present. There is mild chronic anterior height loss at the superior endplate at this level.  Otherwise, vertebral body heights error fairly well-maintained. There are scattered chronic degenerative endplate Schmorl's nodes throughout the mid and lower thoracic spine, most prevalent about the T6-7 intervertebral disc  space an the inferior endplate of T8 and T9. Fatty degenerative endplate changes present at the superior endplate of T8. Diffuse disc desiccation present.  Bone marrow signal intensity within normal limits. No focal osseous lesion. No marrow edema.  Signal intensity within the thoracic spinal cord is within normal limits. No focal intra medullary lesion. There is prominent epidural lipomatosis within the dorsal epidural space extending from T3-4 through T9-10. Thoracic spinal cord is displaced anteriorly at these levels.  Paraspinous soft tissues are within normal limits. Mild atelectatic changes noted within the visualized lungs.  At T4-5, there is a small left paracentral/foraminal disc protrusion partially effacing the left ventral thecal sac and flattening the left hemi cord without cord  signal changes (series 15, image 14). No significant canal or foraminal narrowing.  At T6-7, there is a tiny right paracentral disc protrusion minimally indenting the ventral thecal sac without significant stenosis (series 15, image 20).  At T7-8, a small right paracentral disc protrusion flattens the right ventral thecal sac with mild flattening of the right hemi cord. No cord signal changes or significant stenosis.  At T8-9, tiny left paracentral disc protrusion without significant stenosis (series 16, image 26).  At T9-10, there is prominent bilateral facet arthrosis without significant stenosis. Minimal disc bulge at this level.  At T10-11, mild bilateral facet arthrosis without significant stenosis.  IMPRESSION: MRI CERVICAL SPINE IMPRESSION:  1. Status post ACDF at C4-5 and V0-3 without complication. 2. 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 Mild superimposed degenerative spondylolysis as above. No cord signal changes identified. 3. Small vessel type changes within the partially visualized pons.  MRI THORACIC SPINE IMPRESSION:  1. Mild multilevel degenerative spondylolysis as detailed above without significant canal or foraminal stenosis. 2. Sequelae of prior vertebral augmentation within the T12 vertebral body. 3. Prominent epidural lipomatosis within the dorsal epidural space extending from T3-4 through T9-10.   Electronically Signed   By: Jeannine Boga M.D.   On: 05/07/2015 03:13   Mr Thoracic Spine Wo Contrast  05/07/2015   CLINICAL DATA:  Initial evaluation for persistent left leg pain, weakness, swelling.  EXAM: MRI CERVICAL AND LUMBAR SPINE WITHOUT CONTRAST  TECHNIQUE: Multiplanar and multiecho pulse sequences of the cervical spine, to include the craniocervical junction and cervicothoracic junction, and lumbar spine, were obtained without intravenous contrast.  COMPARISON:  None.  FINDINGS: MRI  CERVICAL SPINE FINDINGS  Patchy T2 hyperdensity within the partially visualized pons noted, which may be related chronic small vessel ischemic disease. Brainstem and cervical spinal cord are mildly atrophic in appearance. Mild cerebellar atrophy. Craniocervical junction widely patent.  Mild straightening with slight reversal of the normal cervical lordosis with apex at C5. No listhesis. Patient is status post ACDF at C4-5 and C6-7. Hardware appears well positioned. Signal intensity within the vertebral body bone marrow is normal. No focal osseous lesion. No marrow edema.  Signal intensity within the cervical spinal cord is normal. No focal intra medullary lesions.  Paraspinous soft tissues are unremarkable. Normal intravascular flow voids present within the vertebral arteries bilaterally. No prevertebral edema.  C2-3: Broad-based posterior disc bulge mildly effaces the ventral thecal sac with resultant mild canal narrowing. Foramina remain widely patent.  C3-4: Mild diffuse degenerative disc osteophyte with mild bilateral uncovertebral hypertrophy. A superimposed central/right paracentral shallow disc protrusion indents the ventral thecal sac and contacts the cervical spinal cord. There is resultant moderate canal stenosis. Cervical spinal cord is  mildly flattened without cord signal changes. There is mild left foraminal narrowing without significant right foraminal stenosis.  C4-5: Status post ACDF. Moderate canal stenosis. Mild left-sided uncovertebral hypertrophy with resultant mild to moderate left foraminal narrowing. No significant right foraminal stenosis.  C5-6: Mild diffuse degenerative disc osteophyte with right-sided facet arthrosis. Posterior disc osteophyte partially effaces the ventral thecal sac and results in moderate canal stenosis. There is moderate right foraminal narrowing without significant left foraminal stenosis.  C6-7: Status post ACDF. Moderate canal narrowing. Uncovertebral and facet  hypertrophy present bilaterally, left greater than right. There is resultant moderate bilateral foraminal narrowing, left greater than right.  C7-T1: Mild right-sided uncovertebral hypertrophy with resultant mild right foraminal narrowing. Left neural foramen remains widely patent. Minimal posterior disc osteophyte without significant canal stenosis.  MRI THORACIC SPINE FINDINGS  Vertebral bodies are normally aligned with preservation of the normal thoracic kyphosis.  Sequelae of prior T12 kyphoplasty present. There is mild chronic anterior height loss at the superior endplate at this level.  Otherwise, vertebral body heights error fairly well-maintained. There are scattered chronic degenerative endplate Schmorl's nodes throughout the mid and lower thoracic spine, most prevalent about the T6-7 intervertebral disc space an the inferior endplate of T8 and T9. Fatty degenerative endplate changes present at the superior endplate of T8. Diffuse disc desiccation present.  Bone marrow signal intensity within normal limits. No focal osseous lesion. No marrow edema.  Signal intensity within the thoracic spinal cord is within normal limits. No focal intra medullary lesion. There is prominent epidural lipomatosis within the dorsal epidural space extending from T3-4 through T9-10. Thoracic spinal cord is displaced anteriorly at these levels.  Paraspinous soft tissues are within normal limits. Mild atelectatic changes noted within the visualized lungs.  At T4-5, there is a small left paracentral/foraminal disc protrusion partially effacing the left ventral thecal sac and flattening the left hemi cord without cord signal changes (series 15, image 14). No significant canal or foraminal narrowing.  At T6-7, there is a tiny right paracentral disc protrusion minimally indenting the ventral thecal sac without significant stenosis (series 15, image 20).  At T7-8, a small right paracentral disc protrusion flattens the right ventral  thecal sac with mild flattening of the right hemi cord. No cord signal changes or significant stenosis.  At T8-9, tiny left paracentral disc protrusion without significant stenosis (series 16, image 26).  At T9-10, there is prominent bilateral facet arthrosis without significant stenosis. Minimal disc bulge at this level.  At T10-11, mild bilateral facet arthrosis without significant stenosis.  IMPRESSION: MRI CERVICAL SPINE IMPRESSION:  1. Status post ACDF at C4-5 and R5-1 without complication. 2. 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 Mild superimposed degenerative spondylolysis as above. No cord signal changes identified. 3. Small vessel type changes within the partially visualized pons.  MRI THORACIC SPINE IMPRESSION:  1. Mild multilevel degenerative spondylolysis as detailed above without significant canal or foraminal stenosis. 2. Sequelae of prior vertebral augmentation within the T12 vertebral body. 3. Prominent epidural lipomatosis within the dorsal epidural space extending from T3-4 through T9-10.   Electronically Signed   By: Jeannine Boga M.D.   On: 05/07/2015 03:13   Mr Lumbar Spine Wo Contrast  05/05/2015   CLINICAL DATA:  Worsening left lower extremity weakness.  EXAM: MRI LUMBAR SPINE WITHOUT CONTRAST  TECHNIQUE: Multiplanar, multisequence MR imaging of the lumbar spine was performed. No intravenous contrast was administered.  COMPARISON:  03/01/2015  FINDINGS: Vertebral alignment is within normal limits. Prior vertebral augmentation is again noted at T12. Lumbar vertebral body heights are preserved. No vertebral marrow edema is seen. Vertebral body heights are relatively well preserved. Conus medullaris is normal in signal and terminates at the inferior aspect of L1. Tiny right renal cyst is again noted.  L1-2 and L2-3:  Negative.  L3-4: Minimal disc bulging and mild facet hypertrophy without  stenosis, unchanged.  L4-5: Mild disc bulging asymmetric to the left and mild facet hypertrophy result in minimal left lateral recess narrowing, unchanged. No spinal canal or neural foraminal stenosis.  L5-S1: Shallow left foraminal disc protrusion and mild facet hypertrophy without stenosis, unchanged.  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 No clear neural impingement.   Electronically Signed   By: Logan Bores   On: 05/05/2015 19:13   Mr Hip Left Wo Contrast  05/07/2015   CLINICAL DATA:  Low back pain for 2-3 months which has worsened over the past week. Left leg weakness. No known injury. Initial encounter.  EXAM: MR OF THE LEFT HIP WITHOUT CONTRAST  TECHNIQUE: Multiplanar, multisequence MR imaging was performed. No intravenous contrast was administered.  COMPARISON:  CT abdomen and pelvis 03/04/2015.  FINDINGS: Bones: Marrow signal is normal in the femoral heads bilaterally without fracture or avascular necrosis. The pelvis and visualized femurs also demonstrate normal signal.  Articular cartilage and labrum  Articular cartilage:  Unremarkable.  Labrum:  Intact.  Joint or bursal effusion  Joint effusion:  None.  Bursae:  Unremarkable.  Muscles and tendons  Muscles and tendons:  Intact and unremarkable in appearance.  Other findings  Miscellaneous: The prostate gland is enlarged. Fat containing inguinal hernias are seen, larger on the left.  IMPRESSION: Normal-appearing pelvis and hips. No finding to explain the patient's symptoms.  Prostatomegaly.   Electronically Signed   By: Inge Rise M.D.   On: 05/07/2015 13:49    ASSESSMENT AND PLAN  1.CAD, s/p recentl PCI/Stent 2. 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 He can continue plavix. 3. Back surgery - results of MR and consultation noted. No plans for surgery at this time. Will sign off, call for questions.  Ehab Humber,M.D.  05/08/2015 10:15  AM

## 2015-05-08 NOTE — Progress Notes (Signed)
Called by case manager that patient is observation status and doesnot qualify for inpatient status. Discussed multiple times with patient, wife, nursing staff, charge RN. Unfortunately, pain is still not well controlled, 8/10 currently and patient does not qualify for neuro surgery. Patient's wife is tearful and very overwhelmed, she feels that she is not able to lift him or help pt with ADL's. She feels that they will be right back in the emergency room and current plan is unsafe discharge. PT evaluation was done today and recommended SNF or 24/7 assistance, needs 2+assist.  - Added fenanyl patch, inc norco - Will discuss with SW/CM tomorrow am.  - Pt's wife wants Dr Hal Neer to see him, will call in am - BS uncontrolled, inc lantus to 60units BID    RAI,RIPUDEEP M.D. [facility] 05/08/2015, 6:38 PM  Pager: [phone]

## 2015-05-08 NOTE — Progress Notes (Signed)
VASCULAR LAB PRELIMINARY  PRELIMINARY  PRELIMINARY  PRELIMINARY  Left lower extremity venous Doppler completed.    Preliminary report:  There is no DVT or SVT noted in the left lower extremity.   Pardeep Pautz, RVT 05/08/2015, 9:18 AM

## 2015-05-08 NOTE — Progress Notes (Signed)
VASCULAR LAB PRELIMINARY  ARTERIAL  ABI completed:ABIs are within normal limits.    RIGHT    LEFT    PRESSURE WAVEFORM  PRESSURE WAVEFORM  BRACHIAL 184 T BRACHIAL 180 T  DP   DP    AT 179  B AT 176 B  PT 129  B PT 141 B  PER   PER    GREAT TOE  NA GREAT TOE  NA    RIGHT LEFT  ABI 0.97 0.96     Amandine Covino, RVT 05/08/2015, 9:11 AM
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
## 2015-05-08 NOTE — Care Management Note (Signed)
Case Management Note  Patient Details  Name: Steve Phillips MRN: [id number] Date of Birth: June 24, 1946  Subjective/Objective:                   Intractable Back pain Action/Plan: Discharge planning  Expected Discharge Date:                 Expected Discharge Plan:  [place]  In-House Referral:     Discharge planning Services  CM Consult  Post Acute Care Choice:  Home Health Choice offered to:     DME Arranged:    DME Agency:     HH Arranged:  PT, OT, RN [place] Agency:  [facility]  Status of Service:  In process, will continue to follow  Medicare Important Message Given:    Date Medicare IM Given:    Medicare IM give by:    Date Additional Medicare IM Given:    Additional Medicare Important Message give by:     If discussed at [place] of Stay Meetings, dates discussed:    Additional Comments: CM met with pt and family and gave pt ABN.  Pt verbalizes understanding but declines to sign the ABN.  Receipt of the ABN witnessed and signed by 2 CMs.  MD and RN notified of [place].  Pt declines to go home. HH arranged with [facility] for HHRN/PT/OT.  Request for face to face and [place] orders made to MD.  Pt received copy of ABN, copy of ABN placed in chart and copy in CM file.  No other CM needs were communicated.   Dellie Catholic, RN 05/08/2015, 4:52 PM

## 2015-05-08 NOTE — Evaluation (Signed)
Physical Therapy Evaluation Patient Details Name: Steve Phillips MRN: [id number] DOB: 26-Apr-1946 Today's Date: 05/08/2015   History of Present Illness  Patient is a 69 y/o male admitted with decline in mobility and intractable Lt hip/back pain of unknown origin.   Clinical Impression  Patient's wife reports patient's decline began after cardiac catheterization 02/23/15 and that his mobility has markedly declined last week, requiring assistance. Patient's pain did not incr with mobility today although he fatigued quickly and oxygen saturation decr to 90% on RA with walk to room door.  Recommend +2 assist for gait tasks for safety as he quickly fatigued.  Patient's wife with limited ability to assist upon discharge home.  Therapy will continue to follow to assist with discharge planning and follow up recommendations. Patient may benefit from further skilled PT to improve mobility, reduce fall risk and incr functional independence.    Follow Up Recommendations SNF;Supervision/Assistance - 24 hour    Equipment Recommendations  None recommended by PT    Recommendations for Other Services       Precautions / Restrictions Precautions Precautions: Fall Precaution Comments: fall in bathroom at home last week Restrictions Weight Bearing Restrictions: No      Mobility  Bed Mobility Overal bed mobility: Needs Assistance Bed Mobility: Sidelying to Sit (with rail, bed flat)   Sidelying to sit: Min assist       General bed mobility comments: difficulty with using arms to push into sit  Transfers Overall transfer level: Needs assistance Equipment used: Rolling walker (2 wheeled) Transfers: Sit to/from Stand Sit to Stand: Mod assist         General transfer comment: mod cues for hand placement  Ambulation/Gait Ambulation/Gait assistance: Mod assist Ambulation Distance (Feet): 8 Feet Assistive device: Rolling walker (2 wheeled) Gait Pattern/deviations: Step-to pattern;Shuffle;Trunk  flexed;Wide base of support (poor left ankle stability)     General Gait Details: fatigued quickly, heavy reliance on external support, SOB with exertion (post exercise HR 88 bpm, 90% oxygen saturation)  Stairs            Wheelchair Mobility    Modified Rankin (Stroke Patients Only)       Balance Overall balance assessment: Needs assistance;History of Falls Sitting-balance support: Feet supported;Bilateral upper extremity supported Sitting balance-Leahy Scale: Fair     Standing balance support: Bilateral upper extremity supported Standing balance-Leahy Scale: Poor Standing balance comment: heavy reliance on RW for standing balance                             Pertinent Vitals/Pain Pain Assessment: 0-10 Pain Score: 5  Pain Location: left hip Pain Descriptors / Indicators: Sore;Aching;Guarding Pain Intervention(s): Limited activity within patient's tolerance;Monitored during session    Home Living Family/patient expects to be discharged to:: Private residence Living Arrangements: Spouse/significant other Available Help at Discharge: Family;Available 24 hours/day Type of Home: House Home Access: Ramped entrance     Home Layout: One level Home Equipment: Walker - 2 wheels;Cane - single point;Shower seat;Toilet riser;Grab bars - toilet;Wheelchair - manual (borrowed w/c from friend)      Prior Function Level of Independence: Needs assistance   Gait / Transfers Assistance Needed: began using RW more consistently within past week.   ADL's / Homemaking Assistance Needed: assist with shower transfer for safety        Hand Dominance   Dominant Hand: Right    Extremity/Trunk Assessment   Upper Extremity Assessment: Generalized weakness  Lower Extremity Assessment: Generalized weakness (swelling in bil legs and feet)      Cervical / Trunk Assessment: Kyphotic  Communication   Communication: No difficulties  Cognition  Arousal/Alertness: Awake/alert Behavior During Therapy: WFL for tasks assessed/performed Overall Cognitive Status: Within Functional Limits for tasks assessed                      General Comments General comments (skin integrity, edema, etc.): swelling bil feet    Exercises General Exercises - Lower Extremity Ankle Circles/Pumps: AROM;Both;10 reps;Supine Quad Sets: AROM;10 reps;Both;Supine Gluteal Sets: AROM;Both;10 reps;Supine Hip ABduction/ADduction: AROM;Both;10 reps;Supine (isometric)      Assessment/Plan    PT Assessment Patient needs continued PT services  PT Diagnosis Difficulty walking;Abnormality of gait;Generalized weakness;Acute pain   PT Problem List Decreased strength;Decreased activity tolerance;Decreased balance;Decreased mobility;Decreased knowledge of use of DME;Decreased safety awareness;Cardiopulmonary status limiting activity;Pain  PT Treatment Interventions DME instruction;Gait training;Functional mobility training;Therapeutic activities;Therapeutic exercise;Balance training;Neuromuscular re-education;Patient/family education;Wheelchair mobility training   PT Goals (Current goals can be found in the Care Plan section) Acute Rehab PT Goals Patient Stated Goal: get stronger PT Goal Formulation: With patient/family Time For Goal Achievement: 05/19/15 Potential to Achieve Goals: Good    Frequency Min 3X/week   Barriers to discharge Decreased caregiver support wife has shoulder injury so cannot lift patient    Co-evaluation               End of Session Equipment Utilized During Treatment: Gait belt Activity Tolerance: Patient limited by fatigue;Patient limited by pain Patient left: in chair;with call bell/phone within reach;with family/visitor present (nurse case manager in room) Nurse Communication: Mobility status    Functional Assessment Tool Used: transfers, gait Functional Limitation: Mobility: Walking and moving around Mobility:  Walking and Moving Around Current Status [phone]): At least 60 percent but less than 80 percent impaired, limited or restricted Mobility: Walking and Moving Around Goal Status [phone]): At least 60 percent but less than 80 percent impaired, limited or restricted Mobility: Walking and Moving Around Discharge Status [phone]): At least 60 percent but less than 80 percent impaired, limited or restricted    Time: 1550-1630 PT Time Calculation (min) (ACUTE ONLY): 40 min   Charges:   PT Evaluation $Initial PT Evaluation Tier I: 1 Procedure PT Treatments $Therapeutic Exercise: 8-22 mins   PT G Codes:   PT G-Codes **NOT FOR INPATIENT CLASS** Functional Assessment Tool Used: transfers, gait Functional Limitation: Mobility: Walking and moving around Mobility: Walking and Moving Around Current Status ([id number]): At least 60 percent but less than 80 percent impaired, limited or restricted Mobility: Walking and Moving Around Goal Status [phone]): At least 60 percent but less than 80 percent impaired, limited or restricted Mobility: Walking and Moving Around Discharge Status [phone]): At least 60 percent but less than 80 percent impaired, limited or restricted   Malka So, [place]  [place] 05/08/2015, 4:49 PM

## 2015-05-09 ENCOUNTER — Encounter: Payer: Self-pay | Admitting: Cardiovascular Disease

## 2015-05-09 DIAGNOSIS — M542 Cervicalgia: Secondary | ICD-10-CM | POA: Diagnosis not present

## 2015-05-09 DIAGNOSIS — T82897S Other specified complication of cardiac prosthetic devices, implants and grafts, sequela: Secondary | ICD-10-CM | POA: Diagnosis not present

## 2015-05-09 DIAGNOSIS — M79606 Pain in leg, unspecified: Secondary | ICD-10-CM | POA: Diagnosis not present

## 2015-05-09 DIAGNOSIS — I2510 Atherosclerotic heart disease of native coronary artery without angina pectoris: Secondary | ICD-10-CM | POA: Diagnosis not present

## 2015-05-09 DIAGNOSIS — M549 Dorsalgia, unspecified: Secondary | ICD-10-CM | POA: Diagnosis not present

## 2015-05-09 DIAGNOSIS — G8929 Other chronic pain: Secondary | ICD-10-CM | POA: Diagnosis not present

## 2015-05-09 LAB — GLUCOSE, CAPILLARY
GLUCOSE-CAPILLARY: 365 mg/dL — AB (ref 65–99)
GLUCOSE-CAPILLARY: 266 mg/dL — AB (ref 65–99)
Glucose-Capillary: 385 mg/dL — ABNORMAL HIGH (ref 65–99)

## 2015-05-09 LAB — BASIC METABOLIC PANEL
Anion gap: 10 (ref 5–15)
BUN: 24 mg/dL — AB (ref 6–20)
CALCIUM: 8.4 mg/dL — AB (ref 8.9–10.3)
CHLORIDE: 96 mmol/L — AB (ref 101–111)
CO2: 27 mmol/L (ref 22–32)
CREATININE: 1.32 mg/dL — AB (ref 0.61–1.24)
GFR calc Af Amer: >60 mL/min (ref >60)
GFR, EST NON AFRICAN AMERICAN: 53 mL/min — AB (ref >60)
Glucose, Bld: 415 mg/dL — ABNORMAL HIGH (ref 65–99)
Potassium: 4.4 mmol/L (ref 3.5–5.1)
Sodium: 133 mmol/L — ABNORMAL LOW (ref 135–145)

## 2015-05-09 LAB — CBC
HEMATOCRIT: 40.6 % (ref 39.0–52.0)
Hemoglobin: 13.8 g/dL (ref 13.0–17.0)
MCH: 25.7 pg — AB (ref 26.0–34.0)
MCHC: 34 g/dL (ref 30.0–36.0)
MCV: 75.7 fL — ABNORMAL LOW (ref 78.0–100.0)
Platelets: 190 10*3/uL (ref 150–400)
RBC: 5.36 MIL/uL (ref 4.22–5.81)
RDW: 18.3 % — ABNORMAL HIGH (ref 11.5–15.5)
WBC: 8.2 10*3/uL (ref 4.0–10.5)

## 2015-05-09 MED ORDER — POLYETHYLENE GLYCOL 3350 17 G PO PACK: 17.0000 g | PACK | Freq: Every day | ORAL | Status: DC

## 2015-05-09 MED ORDER — INSULIN REGULAR HUMAN (CONC) 500 UNIT/ML ~~LOC~~ SOPN: 20.0000 [IU] | PEN_INJECTOR | Freq: Two times a day (BID) | SUBCUTANEOUS | Status: DC

## 2015-05-09 MED ORDER — INSULIN GLARGINE 100 UNIT/ML ~~LOC~~ SOLN: 65.0000 [IU] | Freq: Two times a day (BID) | SUBCUTANEOUS | Status: DC

## 2015-05-09 MED ORDER — HYDROCODONE-ACETAMINOPHEN 5-325 MG PO TABS: 1.0000 | ORAL_TABLET | Freq: Four times a day (QID) | ORAL | Status: DC | PRN

## 2015-05-09 MED ORDER — OXYCODONE HCL 5 MG PO TABS
5.0000 mg | ORAL_TABLET | Freq: Four times a day (QID) | ORAL | Status: DC | PRN
  Administered 2015-05-09: 10 mg via ORAL
  Filled 2015-05-09: qty 2

## 2015-05-09 MED ORDER — INSULIN ASPART 100 UNIT/ML ~~LOC~~ SOLN: 15.0000 [IU] | Freq: Three times a day (TID) | SUBCUTANEOUS | Status: DC

## 2015-05-09 MED ORDER — DICLOFENAC SODIUM 1 % TD GEL
2.0000 g | Freq: Four times a day (QID) | TRANSDERMAL | Status: DC
  Administered 2015-05-09 (×2): 2 g via TOPICAL
  Filled 2015-05-09: qty 100

## 2015-05-09 MED ORDER — INSULIN ASPART 100 UNIT/ML ~~LOC~~ SOLN
15.0000 [IU] | Freq: Three times a day (TID) | SUBCUTANEOUS | Status: DC
  Administered 2015-05-09: 15 [IU] via SUBCUTANEOUS

## 2015-05-09 MED ORDER — FENTANYL 25 MCG/HR TD PT72: 25.0000 ug | MEDICATED_PATCH | TRANSDERMAL | Status: DC

## 2015-05-09 MED ORDER — FLEET ENEMA 7-19 GM/118ML RE ENEM
1.0000 | ENEMA | Freq: Once | RECTAL | Status: AC
  Administered 2015-05-09: 1 via RECTAL
  Filled 2015-05-09: qty 1

## 2015-05-09 MED ORDER — PROMETHAZINE HCL 12.5 MG PO TABS: 12.5000 mg | ORAL_TABLET | Freq: Four times a day (QID) | ORAL | Status: DC | PRN

## 2015-05-09 MED ORDER — DICLOFENAC SODIUM 1 % TD GEL: 2.0000 g | Freq: Four times a day (QID) | TRANSDERMAL | Status: DC

## 2015-05-09 MED ORDER — INSULIN REGULAR HUMAN (CONC) 500 UNIT/ML ~~LOC~~ SOPN: 22.0000 [IU] | PEN_INJECTOR | Freq: Every day | SUBCUTANEOUS | Status: DC

## 2015-05-09 MED ORDER — PREGABALIN 150 MG PO CAPS: 150.0000 mg | ORAL_CAPSULE | ORAL | Status: DC

## 2015-05-09 MED ORDER — SENNOSIDES-DOCUSATE SODIUM 8.6-50 MG PO TABS: 2.0000 | ORAL_TABLET | Freq: Every day | ORAL | Status: DC

## 2015-05-09 MED ORDER — BISACODYL 10 MG RE SUPP
10.0000 mg | Freq: Once | RECTAL | Status: AC
Start: 2015-05-09 — End: 2015-05-09
  Administered 2015-05-09: 10 mg via RECTAL
  Filled 2015-05-09: qty 1

## 2015-05-09 MED ORDER — OXYCODONE HCL 5 MG PO TABS: 5.0000 mg | ORAL_TABLET | Freq: Four times a day (QID) | ORAL | Status: DC | PRN

## 2015-05-09 MED ORDER — SENNA 8.6 MG PO TABS: 1.0000 | ORAL_TABLET | Freq: Every day | ORAL | Status: DC

## 2015-05-09 MED ORDER — INSULIN REGULAR HUMAN (CONC) 500 UNIT/ML ~~LOC~~ SOPN: 30.0000 [IU] | PEN_INJECTOR | Freq: Every day | SUBCUTANEOUS | Status: DC

## 2015-05-09 MED ORDER — LIDOCAINE 5 % EX PTCH: 1.0000 | MEDICATED_PATCH | CUTANEOUS | Status: DC

## 2015-05-09 MED ORDER — INSULIN GLARGINE 100 UNIT/ML ~~LOC~~ SOLN
65.0000 [IU] | Freq: Two times a day (BID) | SUBCUTANEOUS | Status: DC
  Administered 2015-05-09: 65 [IU] via SUBCUTANEOUS
  Filled 2015-05-09 (×2): qty 0.65

## 2015-05-09 MED ORDER — DULOXETINE HCL 60 MG PO CPEP: 60.0000 mg | ORAL_CAPSULE | Freq: Every day | ORAL | Status: DC

## 2015-05-09 MED ORDER — INSULIN ASPART 100 UNIT/ML ~~LOC~~ SOLN: 0.0000 [IU] | Freq: Three times a day (TID) | SUBCUTANEOUS | Status: DC

## 2015-05-09 MED ORDER — DOCUSATE SODIUM 100 MG PO CAPS: 100.0000 mg | ORAL_CAPSULE | Freq: Two times a day (BID) | ORAL | Status: DC

## 2015-05-09 MED ORDER — SENNA 8.6 MG PO TABS
1.0000 | ORAL_TABLET | Freq: Every day | ORAL | Status: DC
  Administered 2015-05-09: 8.6 mg via ORAL
  Filled 2015-05-09: qty 1

## 2015-05-09 NOTE — Care Management Note (Addendum)
Case Management Note  Patient Details  Name: Steve Phillips MRN: [id number] Date of Birth: 1946-04-11  Subjective/Objective:                    Action/Plan:  Spoke with patient and wife regarding equipment . Both stated they do not want any equipment for home at this time .  HHSW added Advanced aware  Expected Discharge Date:      05-09-15            Expected Discharge Plan:  [place]  In-House Referral:     Discharge planning Services  CM Consult  Post Acute Care Choice:  Home Health Choice offered to:  Patient  DME Arranged:    DME Agency:     HH Arranged:  PT, OT, RN, Nurse's Aide, SW HH Agency:  [facility]  Status of Service:  Completed, signed off  Medicare Important Message Given:    Date Medicare IM Given:    Medicare IM give by:    Date Additional Medicare IM Given:    Additional Medicare Important Message give by:     If discussed at [place] of Stay Meetings, dates discussed:    Additional Comments:  Marilu Favre, RN 05/09/2015, 2:49 PM

## 2015-05-09 NOTE — Progress Notes (Signed)
Inpatient Diabetes Program Recommendations  AACE/ADA: New Consensus Statement on Inpatient Glycemic Control (2013)  Target Ranges:  Prepandial:   less than 140 mg/dL      Peak postprandial:   less than 180 mg/dL (1-2 hours)      Critically ill patients:  140 - 180 mg/dL   Reason for Visit: Consultation regarding diabetes out of control  Diabetes history: Type 2 diabetes Outpatient Diabetes medications: Most recently U500 Insulin 20 units with breakfast and lunch and 30 units with supper Current orders for Inpatient glycemic control: Lantus 65 units BID (increased this morning), Novolog meal coverage 15 units tid, Resistant Novolog correction tid, Novolog HS scale  Results for [name], [name] (MRN [id number]) as of 05/09/2015 12:19  Ref. Range 05/08/2015 07:28 05/08/2015 11:59 05/08/2015 17:15 05/08/2015 22:13 05/09/2015 07:49 05/09/2015 11:40  Glucose-Capillary Latest Ref Range: 65-99 mg/dL 311 (H) 364 (H) 425 (H) 345 (H) 365 (H) 385 (H)    Note:  Met with patient and wife at bedside.  Wife states she is the one who keeps up with his meds-- patient doesn't even know names of meds he takes.  Wife states his A1C's have been much worse than 9.1.  He has been as high as 14 in the past.  Recently put back on U500 after large doses of Troujeo and Novolog were ineffective.  About 2 weeks ago met with Steve Reader, RN, CDE with his endocrinologist Dr. Cruzita Lederer.  U500 dosage was titrated to dosages listed about.   Started on Lantus and Novolg here in hospital.  Despite increases in Lantus and Novolog dosages, CBG's are out of control.  Wife has U500 Kwikpen and some pen-needles at bedside. Social Worker in process of trying to find SNF placement for patient.   Recommend the following:  Stop current Lantus and Novolog meal coverage orders.  Patient just received Novolog 35 units as meal and correction, so beginning at supper, give 75% of home U500 dose-- 22 units at supper tonight only  Increase U500  tomorrow to home dosages of 20 units at breakfast, 20 units at lunch, and 30 units at supper   Discussed with Dr. Tana Coast and orders received.  Thank you.  Steve Don S. Marcelline Mates, RN, CNS, CDE Inpatient Diabetes Program, team pager [phone] (8am to 5pm)

## 2015-05-09 NOTE — Progress Notes (Signed)
Physical Therapy Treatment Patient Details Name: Steve Phillips MRN: [id number] DOB: 01-17-1946 Today's Date: 05/09/2015    History of Present Illness Patient is a 69 y/o male admitted with decline in mobility and intractable Lt hip/back pain of unknown origin.     PT Comments    Pt mobility very limited by back and L leg pain.  Pt only able to ambulate 6' with RW and 2 person A.  Lengthy discussion with wife about D/C planning as wife is unable to care for pt at this level of mobility and note pt remains Observation status.  Wife expresses frustration about present situation and not knowing what is causing pt's pain and deficits.  Strongly feel pt is not safe for return to home and will need 24hr care and further rehab.  Will continue to follow.    Follow Up Recommendations  SNF     Equipment Recommendations  None recommended by PT    Recommendations for Other Services       Precautions / Restrictions Precautions Precautions: Fall Restrictions Weight Bearing Restrictions: No    Mobility  Bed Mobility Overal bed mobility: Needs Assistance Bed Mobility: Rolling;Sidelying to Sit Rolling: Min assist Sidelying to sit: Min assist       General bed mobility comments: cues for log roll technique and A with bringing trunk up to sitting.    Transfers Overall transfer level: Needs assistance Equipment used: Rolling walker (2 wheeled) Transfers: Sit to/from Stand Sit to Stand: Mod assist;+2 physical assistance         General transfer comment: cues for UE use and positioning of LEs.  pt indicates very painful with coming to stand.    Ambulation/Gait Ambulation/Gait assistance: Min assist;+2 safety/equipment Ambulation Distance (Feet): 6 Feet Assistive device: Rolling walker (2 wheeled) Gait Pattern/deviations: Step-through pattern;Decreased stride length;Trunk flexed     General Gait Details: pt fatigues very quickly and indicates stabbing sensation in L thigh.  pt needed  close chair follow.  pt with heavy reliance on RW.   Stairs            Wheelchair Mobility    Modified Rankin (Stroke Patients Only)       Balance Overall balance assessment: Needs assistance;History of Falls Sitting-balance support: No upper extremity supported;Feet supported Sitting balance-Leahy Scale: Fair     Standing balance support: Bilateral upper extremity supported;During functional activity Standing balance-Leahy Scale: Poor                      Cognition Arousal/Alertness: Awake/alert Behavior During Therapy: WFL for tasks assessed/performed Overall Cognitive Status: Within Functional Limits for tasks assessed                      Exercises      General Comments        Pertinent Vitals/Pain Pain Assessment: 0-10 Pain Score: 7  Pain Location: Backa nd L LE Pain Descriptors / Indicators: Grimacing;Stabbing Pain Intervention(s): Monitored during session;Repositioned;Patient requesting pain meds-RN notified    Home Living                      Prior Function            PT Goals (current goals can now be found in the care plan section) Acute Rehab PT Goals Patient Stated Goal: get stronger PT Goal Formulation: With patient/family Time For Goal Achievement: 05/19/15 Potential to Achieve Goals: Good Progress towards PT goals: Progressing toward goals  Frequency  Min 3X/week    PT Plan Current plan remains appropriate    Co-evaluation             End of Session Equipment Utilized During Treatment: Gait belt Activity Tolerance: Patient limited by fatigue;Patient limited by pain Patient left: in chair;with call bell/phone within reach;with family/visitor present     Time: 6387-5643 PT Time Calculation (min) (ACUTE ONLY): 35 min  Charges:  $Gait Training: 8-22 mins $Therapeutic Activity: 8-22 mins                    G CodesCatarina Hartshorn, [place] 05/09/2015, 10:49 AM

## 2015-05-09 NOTE — Telephone Encounter (Signed)
This encounter was created in error - please disregard.

## 2015-05-09 NOTE — Clinical Social Work Note (Signed)
Clinical Social Work Assessment  Patient Details  Name: Steve Phillips MRN: [id number] Date of Birth: 05-02-1946  Date of referral:  05/08/15               Reason for consult:  Facility Placement, Insurance Barriers                Permission sought to share information with:  Family Supports Permission granted to share information::  Yes, Verbal Permission Granted  Name::     Dauntae Derusha patient's wife and Detron Carras (patient's son)  Agency::  SNF facilities admissions  Relationship::     Contact Information:     Housing/Transportation Living arrangements for the past 2 months:  Single Family Home Source of Information:  Patient, Spouse Patient Interpreter Needed:  None Criminal Activity/Legal Involvement Pertinent to Current Situation/Hospitalization:  No - Comment as needed Significant Relationships:  Adult Children, Spouse Lives with:  Spouse Do you feel safe going back to the place where you live?  Yes (Patient would like to go to SNF for rehab, but did not have a qualifying stay, so they are agreeing to home health.) Need for family participation in patient care:  Yes (Comment) (Patient had a stroke and needs help with some decisions)  Care giving concerns: Patient's wife is concerned that patient is too weak to return home, and his back pain has not been resolved.  Patient did not have qualifying stay, and family can not pay privately for SNF placement, so patient is going to go home with home health.   Social Worker assessment / plan:  Patient is a 69 year old male who is alert and oriented x4 and lives with his wife.  Patient has a difficult time participating in therapy due to his pain in his lower back.  Patient, wife, physician, and PT feel patient needs to go to SNF for short term rehab, but is unable to because he did not have a Medicare 3 day qualifying stay.  Patient and his wife were upset because they would have to pay privately at a SNF, but unfortunately not able to  afford the cost.  Patient's wife was frustrated, that he did not meet inpatient status, and she expressed that she has her own medical conditions that she is trying to take care of.  Patient and his wife were explained that Medicare guidelines are determined based on how much needs a patient has at hospital.  CSW spent time with patient and his wife talking about other options of home health or paying privately.  CSW explained to patient's wife that there are other insurance polices that do not require qualifying stays but explained that Medicare has different guidelines that need to be followed.  CSW explained to patient's wife that she should look into seeing if patient would be eligible for Medicaid, CSW gave contact information for [place], and encouraged her to contact DSS to discuss Medicaid eligibility criteria.  CSW spoke with patient and his wife about the bed offers he did receive, and told them what the facility said the private pay cost would be.  Patient's wife expressed that they can not afford to pay privately, and will have to take patient home with home health.  CSW spoke to patient's son Harrie Jeans who had questions about why SNF would not be paid for, and CSW explained to him the situation and the reasons why they would have to pay privately.  Patient's son expressed that his parents can not  afford to pay and he can not help them pay for SNF privately.  Patient's son expressed frustration with the system as well, but understands that there is not much else that can be done.  Patient will be discharging home with home health, PT, OT, SW, and RN.     Employment status:  Retired Health visitor, Managed Care PT Recommendations:  [place] / Referral to community resources:  [place], Other (Comment Required) (Other health insurance plans and Medicaid DSS worker contat information given.)  Patient/Family's Response to care:  Patient's family is frustrated but understands that he is not able to go to SNF and will have home health PT.  Patient/Family's Understanding of and Emotional Response to Diagnosis, Current Treatment, and Prognosis:  Patient's family expressed their frustration with the system and not being able to go to SNF, and patient's medical condition is making it more difficult for him to participate in PT.  Emotional Assessment Appearance:  Appears stated age Attitude/Demeanor/Rapport:    Affect (typically observed):  Accepting, Pleasant, Stable, Calm Orientation:  Oriented to Self, Oriented to Place, Oriented to  Time, Oriented to Situation Alcohol / Substance use:  Not Applicable Psych involvement (Current and /or in the community):  No (Comment)  Discharge Needs  Concerns to be addressed:  Lack of Support, Home Safety Concerns, Adjustment to Illness, Financial / Insurance Concerns Readmission within the last 30 days:  No Current discharge risk:  Other (Patient lives with his wife, but she needs some help to take car of him until he gets stronger and family can not pay privately for SNF) Barriers to Discharge:  Other (Patient did not have qualifying stay for Medicare to cover SNF placement)   Ross Ludwig, LCSWA 05/09/2015, 5:18 PM

## 2015-05-09 NOTE — Progress Notes (Signed)
[facility]                                                                              Patient Demographics  Steve Phillips, is a 69 y.o. male, DOB - September 17, 1946, PPI:[id number]  Admit date - 05/05/2015   Admitting Physician Phillips Grout, MD  Outpatient Primary MD for the patient is [name], MD  LOS -    Chief Complaint  Patient presents with  . Back Pain       Brief HPI   Patient is a 69 year old male with CAD, recent stenting of LAD with DES in April 2016, poorly controlled diabetes medicines, Parkinson's, presented with severe lower back pain for the last 1 week. Patient reported chronic back pain since April of this year. Patient had 3 steroidal injections, oral prednisone. Patient noted that the pain continued to get worse and has been unbearable for last several days despite the home pain medications. He denied any fevers, states that pain radiates to left leg and feels weaker associated with back pain. No saddle anesthesia or urinary or bowel incontinence. Patient also reported that he has been unable to walk in the last 2 days due to severity of pain. Patient had seen neurosurgery who did the cervical fusion surgery years ago who wanted to obtain a myelogram for which patient had to be off the Plavix however cardiology had recommended not to stop the Plavix due to recent stenting of the LAD. Patient was admitted for intractable back pain.   Assessment & Plan    Principal Problem:   Intractable back pain and left leg pain- unclear etiology - Patient underwent MRI of the lumbar spine which showed mild lumbar disc degeneration and facet hypertrophy without interval change, no clear neural impingement - Continue Neurontin, Lyrica, patient is currently weaning off lyrica  - pain control with IV Dilaudid, Norco, fentanyl patch added yesterday. - Palliative medicine consult obtained for pain management assistance  - MRI C spine, T spine and hip all  unremarkable - Doppler US LE area for DVT, ABI pending, ABI 0.96 on the left and 0.97 on the right, not enough to show arterial insufficiency as the cause of his intractable left leg pain. Patient was evaluated by vascular surgery, Dr. Scot Dock, no acute vascular issues causing the back pain and leg pain - PT eval recommended skilled nursing facility - Patient and wife requesting Dr. Hal Neer to evaluate patient however Dr Hal Neer is currently off   Active Problems:   CAD (coronary artery disease) - Continue aspirin, Plavix, amlodipine, lisinopril, HCTZ, restarted Plavix as no plans of surgery at this time - Restart Plavix has no plans of surgery    Hypertension - Currently stable, continue amlodipine, Coreg, Lasix, lisinopril, HCTZ    DM (diabetes mellitus), type 2 with neurological complications: Uncontrolled due to prednisone - hemoglobin A1c 9.1, appreciate diabetic coordinator recommendations - Continue resistant sliding scale, cont U500 pen       Obesity-BMI 35 - Patient was counseled on diet and weight control  Constipation Add Colace and MiraLAX, Senokot, enema today  Code status: Full code  Family communication Discussed in detail in detail  with the patient, all imaging results, lab results explained to the patient and his wife at the bedside.   Disposition Plan: Awaiting plan from social work, trying to arrange skilled nursing facility, palliative medicine recommendations regarding pain management  TimeSpent in minutes 25 minutes  Procedures  MRI lumbar spine   Consults   Neurosurgery Cardiology  DVT Prophylaxis   SCD's  Medications  Scheduled Meds: . amLODipine  5 mg Oral Daily  . aspirin EC  81 mg Oral TID  . carbidopa-levodopa  1 tablet Oral TID  . carvedilol  25 mg Oral BID  . clopidogrel  75 mg Oral Daily  . diclofenac sodium  2 g Topical QID  . fentaNYL  25 mcg Transdermal Q72H  . gabapentin  300 mg Oral QHS  . hydrochlorothiazide  12.5 mg Oral Daily    And  . lisinopril  20 mg Oral Daily  . insulin aspart  0-20 Units Subcutaneous TID WC  . insulin aspart  0-5 Units Subcutaneous QHS  . pantoprazole  40 mg Oral BID  . polyethylene glycol  17 g Oral Daily  . pregabalin  150 mg Oral BH-q7a  . senna-docusate  2 tablet Oral QHS  . simvastatin  20 mg Oral QHS  . sodium chloride  3 mL Intravenous Q12H  . [START ON 05/10/2015] Insulin Regular Human (Conc)  20 Units Subcutaneous BID WC  . [START ON 05/10/2015] Insulin Regular Human (Conc)  30 Units Subcutaneous Q supper  . Insulin Regular Human (Conc)  22 Units Subcutaneous Q supper   Continuous Infusions:  PRN Meds:.sodium chloride, acetaminophen, hydrALAZINE, HYDROcodone-acetaminophen, HYDROmorphone (DILAUDID) injection, nitroGLYCERIN, ondansetron **OR** ondansetron (ZOFRAN) IV, sodium chloride   Antibiotics   Anti-infectives    None        Subjective:   Steve Phillips was seen and examined today. C/o left leg pain and intermittently however somewhat better after starting physical therapy. Wife continues to be overwhelmed and frustrated and unable to take care of him at home. Comparing of nausea and constipation. No bowel movement yet. Patient denies dizziness, chest pain, shortness of breath. No abdominal pain.  No acute events overnight.    Objective:   Blood pressure 161/75, pulse 72, temperature 97.8 F (36.6 C), temperature source Oral, resp. rate 16, height 6' (1.829 m), weight 118.797 kg (261 lb 14.4 oz), SpO2 97 %.  Wt Readings from Last 3 Encounters:  05/09/15 118.797 kg (261 lb 14.4 oz)  04/05/15 115.667 kg (255 lb)  04/04/15 117.141 kg (258 lb 4 oz)     Intake/Output Summary (Last 24 hours) at 05/09/15 1308 Last data filed at 05/09/15 0600  Gross per 24 hour  Intake    480 ml  Output   1250 ml  Net   -770 ml    Exam  General: Alert and oriented x 3, NAD  HEENT:  PERRLA, EOMI   Neck: Supple, no JVD  CVS: S1 S2 clear, RRR   Respiratory: CTAB  Abdomen:  Soft, nontender, nondistended, + bowel sounds  Ext: no cyanosis clubbing or edema  Neuro: no new deficits  Skin: No rashes  Psych: Normal affect and demeanor, alert and oriented x3    Data Review   Micro Results No results found for this or any previous visit (from the past 240 hour(s)).  Radiology Reports Dg Pelvis 1-2 Views  05/06/2015   CLINICAL DATA:  Left hip and pelvic pain for 3 weeks without known injury. Initial encounter.  EXAM: PELVIS - 1-2 VIEW  COMPARISON:  None.  FINDINGS: There is no evidence of pelvic fracture or diastasis. No pelvic bone lesions are seen. Hip and sacroiliac joints appear normal.  IMPRESSION: Normal pelvis.   Electronically Signed   By: Marijo Conception, M.D.   On: 05/06/2015 16:59   Mr Cervical Spine Wo Contrast  05/07/2015   CLINICAL DATA:  Initial evaluation for persistent left leg pain, weakness, swelling.  EXAM: MRI CERVICAL AND LUMBAR SPINE WITHOUT CONTRAST  TECHNIQUE: Multiplanar and multiecho pulse sequences of the cervical spine, to include the craniocervical junction and cervicothoracic junction, and lumbar spine, were obtained without intravenous contrast.  COMPARISON:  None.  FINDINGS: MRI CERVICAL SPINE FINDINGS  Patchy T2 hyperdensity within the partially visualized pons noted, which may be related chronic small vessel ischemic disease. Brainstem and cervical spinal cord are mildly atrophic in appearance. Mild cerebellar atrophy. Craniocervical junction widely patent.  Mild straightening with slight reversal of the normal cervical lordosis with apex at C5. No listhesis. Patient is status post ACDF at C4-5 and C6-7. Hardware appears well positioned. Signal intensity within the vertebral body bone marrow is normal. No focal osseous lesion. No marrow edema.  Signal intensity within the cervical spinal cord is normal. No focal intra medullary lesions.  Paraspinous soft tissues are unremarkable. Normal intravascular flow voids present within the vertebral  arteries bilaterally. No prevertebral edema.  C2-3: Broad-based posterior disc bulge mildly effaces the ventral thecal sac with resultant mild canal narrowing. Foramina remain widely patent.  C3-4: Mild diffuse degenerative disc osteophyte with mild bilateral uncovertebral hypertrophy. A superimposed central/right paracentral shallow disc protrusion indents the ventral thecal sac and contacts the cervical spinal cord. There is resultant moderate canal stenosis. Cervical spinal cord is mildly flattened without cord signal changes. There is mild left foraminal narrowing without significant right foraminal stenosis.  C4-5: Status post ACDF. Moderate canal stenosis. Mild left-sided uncovertebral hypertrophy with resultant mild to moderate left foraminal narrowing. No significant right foraminal stenosis.  C5-6: Mild diffuse degenerative disc osteophyte with right-sided facet arthrosis. Posterior disc osteophyte partially effaces the ventral thecal sac and results in moderate canal stenosis. There is moderate right foraminal narrowing without significant left foraminal stenosis.  C6-7: Status post ACDF. Moderate canal narrowing. Uncovertebral and facet hypertrophy present bilaterally, left greater than right. There is resultant moderate bilateral foraminal narrowing, left greater than right.  C7-T1: Mild right-sided uncovertebral hypertrophy with resultant mild right foraminal narrowing. Left neural foramen remains widely patent. Minimal posterior disc osteophyte without significant canal stenosis.  MRI THORACIC SPINE FINDINGS  Vertebral bodies are normally aligned with preservation of the normal thoracic kyphosis.  Sequelae of prior T12 kyphoplasty present. There is mild chronic anterior height loss at the superior endplate at this level.  Otherwise, vertebral body heights error fairly well-maintained. There are scattered chronic degenerative endplate Schmorl's nodes throughout the mid and lower thoracic spine, most  prevalent about the T6-7 intervertebral disc space an the inferior endplate of T8 and T9. Fatty degenerative endplate changes present at the superior endplate of T8. Diffuse disc desiccation present.  Bone marrow signal intensity within normal limits. No focal osseous lesion. No marrow edema.  Signal intensity within the thoracic spinal cord is within normal limits. No focal intra medullary lesion. There is prominent epidural lipomatosis within the dorsal epidural space extending from T3-4 through T9-10. Thoracic spinal cord is displaced anteriorly at these levels.  Paraspinous soft tissues are within normal limits. Mild atelectatic changes noted within the visualized lungs.  At T4-5, there is  a small left paracentral/foraminal disc protrusion partially effacing the left ventral thecal sac and flattening the left hemi cord without cord signal changes (series 15, image 14). No significant canal or foraminal narrowing.  At T6-7, there is a tiny right paracentral disc protrusion minimally indenting the ventral thecal sac without significant stenosis (series 15, image 20).  At T7-8, a small right paracentral disc protrusion flattens the right ventral thecal sac with mild flattening of the right hemi cord. No cord signal changes or significant stenosis.  At T8-9, tiny left paracentral disc protrusion without significant stenosis (series 16, image 26).  At T9-10, there is prominent bilateral facet arthrosis without significant stenosis. Minimal disc bulge at this level.  At T10-11, mild bilateral facet arthrosis without significant stenosis.  IMPRESSION: MRI CERVICAL SPINE IMPRESSION:  1. Status post ACDF at C4-5 and D7-4 without complication. 2. 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 Mild superimposed degenerative spondylolysis as above. No cord signal changes identified. 3. Small vessel type changes within the partially  visualized pons.  MRI THORACIC SPINE IMPRESSION:  1. Mild multilevel degenerative spondylolysis as detailed above without significant canal or foraminal stenosis. 2. Sequelae of prior vertebral augmentation within the T12 vertebral body. 3. Prominent epidural lipomatosis within the dorsal epidural space extending from T3-4 through T9-10.   Electronically Signed   By: Jeannine Boga M.D.   On: 05/07/2015 03:13   Mr Thoracic Spine Wo Contrast  05/07/2015   CLINICAL DATA:  Initial evaluation for persistent left leg pain, weakness, swelling.  EXAM: MRI CERVICAL AND LUMBAR SPINE WITHOUT CONTRAST  TECHNIQUE: Multiplanar and multiecho pulse sequences of the cervical spine, to include the craniocervical junction and cervicothoracic junction, and lumbar spine, were obtained without intravenous contrast.  COMPARISON:  None.  FINDINGS: MRI CERVICAL SPINE FINDINGS  Patchy T2 hyperdensity within the partially visualized pons noted, which may be related chronic small vessel ischemic disease. Brainstem and cervical spinal cord are mildly atrophic in appearance. Mild cerebellar atrophy. Craniocervical junction widely patent.  Mild straightening with slight reversal of the normal cervical lordosis with apex at C5. No listhesis. Patient is status post ACDF at C4-5 and C6-7. Hardware appears well positioned. Signal intensity within the vertebral body bone marrow is normal. No focal osseous lesion. No marrow edema.  Signal intensity within the cervical spinal cord is normal. No focal intra medullary lesions.  Paraspinous soft tissues are unremarkable. Normal intravascular flow voids present within the vertebral arteries bilaterally. No prevertebral edema.  C2-3: Broad-based posterior disc bulge mildly effaces the ventral thecal sac with resultant mild canal narrowing. Foramina remain widely patent.  C3-4: Mild diffuse degenerative disc osteophyte with mild bilateral uncovertebral hypertrophy. A superimposed central/right  paracentral shallow disc protrusion indents the ventral thecal sac and contacts the cervical spinal cord. There is resultant moderate canal stenosis. Cervical spinal cord is mildly flattened without cord signal changes. There is mild left foraminal narrowing without significant right foraminal stenosis.  C4-5: Status post ACDF. Moderate canal stenosis. Mild left-sided uncovertebral hypertrophy with resultant mild to moderate left foraminal narrowing. No significant right foraminal stenosis.  C5-6: Mild diffuse degenerative disc osteophyte with right-sided facet arthrosis. Posterior disc osteophyte partially effaces the ventral thecal sac and results in moderate canal stenosis. There is moderate right foraminal narrowing without significant left foraminal stenosis.  C6-7: Status post ACDF. Moderate canal narrowing. Uncovertebral and facet hypertrophy present bilaterally, left greater than right. There is resultant moderate  bilateral foraminal narrowing, left greater than right.  C7-T1: Mild right-sided uncovertebral hypertrophy with resultant mild right foraminal narrowing. Left neural foramen remains widely patent. Minimal posterior disc osteophyte without significant canal stenosis.  MRI THORACIC SPINE FINDINGS  Vertebral bodies are normally aligned with preservation of the normal thoracic kyphosis.  Sequelae of prior T12 kyphoplasty present. There is mild chronic anterior height loss at the superior endplate at this level.  Otherwise, vertebral body heights error fairly well-maintained. There are scattered chronic degenerative endplate Schmorl's nodes throughout the mid and lower thoracic spine, most prevalent about the T6-7 intervertebral disc space an the inferior endplate of T8 and T9. Fatty degenerative endplate changes present at the superior endplate of T8. Diffuse disc desiccation present.  Bone marrow signal intensity within normal limits. No focal osseous lesion. No marrow edema.  Signal intensity within  the thoracic spinal cord is within normal limits. No focal intra medullary lesion. There is prominent epidural lipomatosis within the dorsal epidural space extending from T3-4 through T9-10. Thoracic spinal cord is displaced anteriorly at these levels.  Paraspinous soft tissues are within normal limits. Mild atelectatic changes noted within the visualized lungs.  At T4-5, there is a small left paracentral/foraminal disc protrusion partially effacing the left ventral thecal sac and flattening the left hemi cord without cord signal changes (series 15, image 14). No significant canal or foraminal narrowing.  At T6-7, there is a tiny right paracentral disc protrusion minimally indenting the ventral thecal sac without significant stenosis (series 15, image 20).  At T7-8, a small right paracentral disc protrusion flattens the right ventral thecal sac with mild flattening of the right hemi cord. No cord signal changes or significant stenosis.  At T8-9, tiny left paracentral disc protrusion without significant stenosis (series 16, image 26).  At T9-10, there is prominent bilateral facet arthrosis without significant stenosis. Minimal disc bulge at this level.  At T10-11, mild bilateral facet arthrosis without significant stenosis.  IMPRESSION: MRI CERVICAL SPINE IMPRESSION:  1. Status post ACDF at C4-5 and T0-1 without complication. 2. 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 Mild superimposed degenerative spondylolysis as above. No cord signal changes identified. 3. Small vessel type changes within the partially visualized pons.  MRI THORACIC SPINE IMPRESSION:  1. Mild multilevel degenerative spondylolysis as detailed above without significant canal or foraminal stenosis. 2. Sequelae of prior vertebral augmentation within the T12 vertebral body. 3. Prominent epidural lipomatosis within the dorsal epidural space extending from  T3-4 through T9-10.   Electronically Signed   By: Jeannine Boga M.D.   On: 05/07/2015 03:13   Mr Lumbar Spine Wo Contrast  05/05/2015   CLINICAL DATA:  Worsening left lower extremity weakness.  EXAM: MRI LUMBAR SPINE WITHOUT CONTRAST  TECHNIQUE: Multiplanar, multisequence MR imaging of the lumbar spine was performed. No intravenous contrast was administered.  COMPARISON:  03/01/2015  FINDINGS: Vertebral alignment is within normal limits. Prior vertebral augmentation is again noted at T12. Lumbar vertebral body heights are preserved. No vertebral marrow edema is seen. Vertebral body heights are relatively well preserved. Conus medullaris is normal in signal and terminates at the inferior aspect of L1. Tiny right renal cyst is again noted.  L1-2 and L2-3:  Negative.  L3-4: Minimal disc bulging and mild facet hypertrophy without stenosis, unchanged.  L4-5: Mild disc bulging asymmetric to the left and mild facet hypertrophy result in minimal left lateral recess narrowing, unchanged. No spinal canal  or neural foraminal stenosis.  L5-S1: Shallow left foraminal disc protrusion and mild facet hypertrophy without stenosis, unchanged.  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 No clear neural impingement.   Electronically Signed   By: Logan Bores   On: 05/05/2015 19:13   Mr Hip Left Wo Contrast  05/07/2015   CLINICAL DATA:  Low back pain for 2-3 months which has worsened over the past week. Left leg weakness. No known injury. Initial encounter.  EXAM: MR OF THE LEFT HIP WITHOUT CONTRAST  TECHNIQUE: Multiplanar, multisequence MR imaging was performed. No intravenous contrast was administered.  COMPARISON:  CT abdomen and pelvis 03/04/2015.  FINDINGS: Bones: Marrow signal is normal in the femoral heads bilaterally without fracture or avascular necrosis. The pelvis and visualized femurs also demonstrate normal signal.  Articular cartilage and labrum  Articular  cartilage:  Unremarkable.  Labrum:  Intact.  Joint or bursal effusion  Joint effusion:  None.  Bursae:  Unremarkable.  Muscles and tendons  Muscles and tendons:  Intact and unremarkable in appearance.  Other findings  Miscellaneous: The prostate gland is enlarged. Fat containing inguinal hernias are seen, larger on the left.  IMPRESSION: Normal-appearing pelvis and hips. No finding to explain the patient's symptoms.  Prostatomegaly.   Electronically Signed   By: Inge Rise M.D.   On: 05/07/2015 13:49    CBC  Recent Labs Lab 05/05/15 1657 05/06/15 0318 05/08/15 0515 05/09/15 0550  WBC 11.0* 9.5 11.6* 8.2  HGB 14.4 12.8* 13.8 13.8  HCT 43.4 38.6* 40.2 40.6  PLT 191 175 185 190  MCV 76.5* 76.6* 75.0* 75.7*  MCH 25.4* 25.4* 25.7* 25.7*  MCHC 33.2 33.2 34.3 34.0  RDW 17.9* 17.9* 17.9* 18.3*  LYMPHSABS 2.0  --   --   --   MONOABS 0.7  --   --   --   EOSABS 0.3  --   --   --   BASOSABS 0.0  --   --   --     Chemistries   Recent Labs Lab 05/05/15 1657 05/06/15 0318 05/08/15 0515 05/09/15 0550  NA 138 135 137 133*  K 3.9 4.7 4.2 4.4  CL 101 101 100* 96*  CO2 21* 24 28 27   GLUCOSE 286* 451* 339* 415*  BUN 13 16 27* 24*  CREATININE 1.11 1.42* 1.52* 1.32*  CALCIUM 8.8* 8.5* 9.1 8.4*   ------------------------------------------------------------------------------------------------------------------ estimated creatinine clearance is 69.3 mL/min (by C-G formula based on Cr of 1.32). ------------------------------------------------------------------------------------------------------------------ No results for input(s): HGBA1C in the last 72 hours. ------------------------------------------------------------------------------------------------------------------ No results for input(s): CHOL, HDL, LDLCALC, TRIG, CHOLHDL, LDLDIRECT in the last 72 hours. ------------------------------------------------------------------------------------------------------------------ No results  for input(s): TSH, T4TOTAL, T3FREE, THYROIDAB in the last 72 hours.  Invalid input(s): FREET3 ------------------------------------------------------------------------------------------------------------------ No results for input(s): VITAMINB12, FOLATE, FERRITIN, TIBC, IRON, RETICCTPCT in the last 72 hours.  Coagulation profile No results for input(s): INR, PROTIME in the last 168 hours.  No results for input(s): DDIMER in the last 72 hours.  Cardiac Enzymes No results for input(s): CKMB, TROPONINI, MYOGLOBIN in the last 168 hours.  Invalid input(s): CK ------------------------------------------------------------------------------------------------------------------ Invalid input(s): [place]  05/08/15 0728 05/08/15 1159 05/08/15 1715 05/08/15 2213 05/09/15 0749 05/09/15 1140  GLUCAP 311* 364* 425* 345* 365* 385*     Geniva Lohnes M.D. [facility] 05/09/2015, 1:08 PM  Pager: [phone]   Between 7am to 7pm - call Pager - [phone]  After 7pm go to [email or web address] - password [email or web address]  Call night  coverage person covering after 7pm

## 2015-05-09 NOTE — Progress Notes (Signed)
Pt having nausea and has vomited this morning. Pt given PRN IV medication to control symptoms.

## 2015-05-09 NOTE — Progress Notes (Signed)
Duplicate note

## 2015-05-09 NOTE — Discharge Summary (Signed)
Physician Discharge Summary   Patient ID: Steve Phillips MRN: [id number] DOB/AGE: Jul 25, 1946 69 y.o.  Admit date: 05/05/2015 Discharge date: 05/09/2015  Primary Care Physician:  [name], MD  Discharge Diagnoses:    . Intractable back pain and left leg pain  . CAD (coronary artery disease) . Hypertension . 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 DM (diabetes mellitus), type 2 with neurological complications . Obesity-BMI 35  Consults:   Neurosurgery- Dr Arnoldo Morale Cardiology Vascular surgery- Dr Scot Dock Palliative medicine for pain management      Recommendations for Outpatient Follow-up:  Patient was recommended to follow-up with Dr Hal Neer and pain management asap   Follow BMET, lasix was placed on hold as cr was worse to 1.5 on 6/19, now improving 1.3 on 6/20  TESTS THAT NEED FOLLOW-UP BMET   DIET: carb modified diet     Allergies:   Allergies  Allergen Reactions  . Codeine Nausea Only    Derivatives -Nausea.  . Tramadol Other (See Comments)    unknown  . Trazodone And Nefazodone Nausea Only     Discharge Medications:   Medication List    STOP taking these medications        furosemide 20 MG tablet  Commonly known as:  LASIX     glucose blood test strip  Commonly known as:  FREESTYLE TEST STRIPS     HYDROcodone-acetaminophen 5-325 MG per tablet  Commonly known as:  NORCO/VICODIN     Insulin Glargine 300 UNIT/ML Sopn  Commonly known as:  TOUJEO SOLOSTAR     insulin lispro 100 UNIT/ML injection  Commonly known as:  HUMALOG     insulin regular human CONCENTRATED 500 UNIT/ML injection  Commonly known as:  HUMULIN R U-500 KWIKPEN     predniSONE 20 MG tablet  Commonly known as:  DELTASONE     spironolactone 25 MG tablet  Commonly known as:  ALDACTONE     tiZANidine 4 MG tablet  Commonly known as:  ZANAFLEX      TAKE these medications        acetaminophen 325 MG tablet  Commonly known as:  TYLENOL  Take 2 tablets  (650 mg total) by mouth every 4 (four) hours as needed for headache or mild pain.     amLODipine 5 MG tablet  Commonly known as:  NORVASC  Take 1 tablet (5 mg total) by mouth daily.     aspirin EC 81 MG tablet  Take 81 mg by mouth 3 (three) times daily.     carbidopa-levodopa 25-100 MG per tablet  Commonly known as:  SINEMET IR  Take 2 tablets by mouth 3 (three) times daily.     carvedilol 25 MG tablet  Commonly known as:  COREG  TAKE ONE TABLET BY MOUTH TWICE DAILY     clopidogrel 75 MG tablet  Commonly known as:  PLAVIX  Take 1 tablet (75 mg total) by mouth daily with breakfast.     cyanocobalamin 1000 MCG/ML injection  Commonly known as:  (VITAMIN B-12)  Inject 1,000 mcg into the muscle every 30 (thirty) days.     diclofenac sodium 1 % Gel  Commonly known as:  VOLTAREN  Apply 2 g topically 4 (four) times daily.     docusate sodium 100 MG capsule  Commonly known as:  COLACE  Take 1 capsule (100 mg total) by mouth 2 (two) times daily.     DULoxetine 60 MG capsule  Commonly known as:  CYMBALTA  Take  1 capsule (60 mg total) by mouth daily. TAKE 1 CAPSULE DAILY     fentaNYL 25 MCG/HR patch  Commonly known as:  DURAGESIC - dosed mcg/hr  Place 1 patch (25 mcg total) onto the skin every 3 (three) days.     gabapentin 300 MG capsule  Commonly known as:  NEURONTIN  Take 300 mg by mouth at bedtime.     insulin aspart 100 UNIT/ML injection  Commonly known as:  novoLOG  Inject 0-20 Units into the skin 3 (three) times daily with meals. Sliding scale insulin correction factor:CBG < 70: Drink juice; CBG 70-120: 0 units ; CBG 121-150: 3 units; CBG 151-200: 4 units; CBG 201-250: 7 units; CBG 251-300: 11 units; CBG 301-350: 15 units ; CBG 351-400: 20 units ; CBG > 400: please call your physician     lisinopril-hydrochlorothiazide 20-12.5 MG per tablet  Commonly known as:  PRINZIDE,ZESTORETIC  Take 1 tablet by mouth 2 (two) times daily.     metFORMIN 1000 MG tablet  Commonly  known as:  GLUCOPHAGE  TAKE ONE TABLET BY MOUTH TWICE DAILY     nitroGLYCERIN 0.4 MG SL tablet  Commonly known as:  NITROSTAT  Place 1 tablet (0.4 mg total) under the tongue every 5 (five) minutes as needed for chest pain.     oxyCODONE 5 MG immediate release tablet  Commonly known as:  Oxy IR/ROXICODONE  Take 1-2 tablets (5-10 mg total) by mouth every 6 (six) hours as needed for severe pain or breakthrough pain.     pantoprazole 40 MG tablet  Commonly known as:  PROTONIX  Take 1 tablet (40 mg total) by mouth 2 (two) times daily.     polyethylene glycol packet  Commonly known as:  MIRALAX / GLYCOLAX  Take 17 g by mouth daily.     pregabalin 150 MG capsule  Commonly known as:  LYRICA  Take 1 capsule (150 mg total) by mouth every morning.     promethazine 12.5 MG tablet  Commonly known as:  PHENERGAN  Take 1 tablet (12.5 mg total) by mouth every 6 (six) hours as needed for nausea or vomiting.     senna 8.6 MG Tabs tablet  Commonly known as:  SENOKOT  Take 1 tablet (8.6 mg total) by mouth daily.     simvastatin 20 MG tablet  Commonly known as:  ZOCOR  Take 1 tablet (20 mg total) by mouth at bedtime.         Brief H and P: For complete details please refer to admission H and P, but in brief Patient is a 69 year old male with CAD, recent stenting of LAD with DES in April 2016, poorly controlled diabetes medicines, Parkinson's, presented with severe lower back pain for the last 1 week. Patient reported chronic back pain since April of this year. Patient had 3 steroidal injections, oral prednisone. Patient noted that the pain continued to get worse and has been unbearable for last several days despite the home pain medications. He denied any fevers, states that pain radiates to left leg and feels weaker associated with back pain. No saddle anesthesia or urinary or bowel incontinence. Patient also reported that he has been unable to walk in the last 2 days due to severity of pain.  Patient had seen neurosurgery who did the cervical fusion surgery years ago who wanted to obtain a myelogram for which patient had to be off the Plavix however cardiology had recommended not to stop the Plavix due to recent stenting  of the LAD. Patient was admitted for intractable back pain.  Hospital Course:  Intractable back pain and left leg pain- unclear etiology - Patient underwent MRI of the lumbar spine which showed mild lumbar disc degeneration and facet hypertrophy without interval change, no clear neural impingement. Neuro surgery was consulted and patient was seen by Dr Arnoldo Morale who did not feel that patient needs any neurosurgery, aptient has some facet arthropathy at L5-S1 but no neural compression, ruptured disc or stenosis.   MRI C spine, T spine and hip all unremarkable - Doppler US LE area for DVT, ABI 0.96 on the left and 0.97 on the right, not enough to show arterial insufficiency as the cause of his intractable left leg pain. Patient was evaluated by vascular surgery, Dr. Scot Dock, no acute vascular issues causing the back pain and leg pain Patient was placed on fentanyl patch and pain control. He is weaning off Lyrica, will be on neurontin 300mg  BID after 1 week.  PT eval recommended skilled nursing facility unfortunately, patient did not meet inpatient criteria and family unable to afford SNF otherwise. Home health PT, OT, HHA, RN social work were arranged.  Palliative medicine was consulted for pain management assistance.   CAD (coronary artery disease) - Continue aspirin, Plavix, amlodipine, lisinopril, HCTZ, restarted Plavix as no plans of surgery at this time. Patient was followed by cardiology.    Hypertension - Currently stable, continue amlodipine, Coreg, lisinopril, HCTZ. Lasix and aldactone currently on hold as Cr was elevated to 1.5 on 6/19, now improving.   DM (diabetes mellitus), type 2 with neurological complications: Uncontrolled due to prednisone - hemoglobin  A1c 9.1, appreciate diabetic coordinator recommendations - Continue resistant sliding scale, cont U500 penas per endocrinology outpatient recommendations.     Obesity-BMI 35 - Patient was counseled on diet and weight control  Constipation Add Colace and MiraLAX, Senokot, dulcolax suppository. Enema as needed.   Day of Discharge BP 167/81 mmHg  Pulse 87  Temp(Src) 97.6 F (36.4 C) (Oral)  Resp 16  Ht 6' (1.829 m)  Wt 118.797 kg (261 lb 14.4 oz)  BMI 35.51 kg/m2  SpO2 97%  Physical Exam: General: Alert and awake oriented x3 not in any acute distress. HEENT: anicteric sclera, pupils reactive to light and accommodation CVS: S1-S2 clear no murmur rubs or gallops Chest: clear to auscultation bilaterally, no wheezing rales or rhonchi Abdomen: soft nontender, nondistended, normal bowel sounds Extremities: no cyanosis, clubbing or edema noted bilaterally Neuro: Cranial nerves II-XII intact, no focal neurological deficits   The results of significant diagnostics from this hospitalization (including imaging, microbiology, ancillary and laboratory) are listed below for reference.    LAB RESULTS: Basic Metabolic Panel:  Recent Labs Lab 05/08/15 0515 05/09/15 0550  NA 137 133*  K 4.2 4.4  CL 100* 96*  CO2 28 27  GLUCOSE 339* 415*  BUN 27* 24*  CREATININE 1.52* 1.32*  CALCIUM 9.1 8.4*   Liver Function Tests: No results for input(s): AST, ALT, ALKPHOS, BILITOT, PROT, ALBUMIN in the last 168 hours. No results for input(s): LIPASE, AMYLASE in the last 168 hours. No results for input(s): AMMONIA in the last 168 hours. CBC:  Recent Labs Lab 05/05/15 1657  05/08/15 0515 05/09/15 0550  WBC 11.0*  < > 11.6* 8.2  NEUTROABS 8.0*  --   --   --   HGB 14.4  < > 13.8 13.8  HCT 43.4  < > 40.2 40.6  MCV 76.5*  < > 75.0* 75.7*  PLT 191  < >  185 190  < > = values in this interval not displayed. Cardiac Enzymes: No results for input(s): CKTOTAL, CKMB, CKMBINDEX, TROPONINI in the  last 168 hours. BNP: Invalid input(s): POCBNP CBG:  Recent Labs Lab 05/09/15 0749 05/09/15 1140  GLUCAP 365* 385*    Significant Diagnostic Studies:  Dg Pelvis 1-2 Views  05/06/2015   CLINICAL DATA:  Left hip and pelvic pain for 3 weeks without known injury. Initial encounter.  EXAM: PELVIS - 1-2 VIEW  COMPARISON:  None.  FINDINGS: There is no evidence of pelvic fracture or diastasis. No pelvic bone lesions are seen. Hip and sacroiliac joints appear normal.  IMPRESSION: Normal pelvis.   Electronically Signed   By: Marijo Conception, M.D.   On: 05/06/2015 16:59   Mr Lumbar Spine Wo Contrast  05/05/2015   CLINICAL DATA:  Worsening left lower extremity weakness.  EXAM: MRI LUMBAR SPINE WITHOUT CONTRAST  TECHNIQUE: Multiplanar, multisequence MR imaging of the lumbar spine was performed. No intravenous contrast was administered.  COMPARISON:  03/01/2015  FINDINGS: Vertebral alignment is within normal limits. Prior vertebral augmentation is again noted at T12. Lumbar vertebral body heights are preserved. No vertebral marrow edema is seen. Vertebral body heights are relatively well preserved. Conus medullaris is normal in signal and terminates at the inferior aspect of L1. Tiny right renal cyst is again noted.  L1-2 and L2-3:  Negative.  L3-4: Minimal disc bulging and mild facet hypertrophy without stenosis, unchanged.  L4-5: Mild disc bulging asymmetric to the left and mild facet hypertrophy result in minimal left lateral recess narrowing, unchanged. No spinal canal or neural foraminal stenosis.  L5-S1: Shallow left foraminal disc protrusion and mild facet hypertrophy without stenosis, unchanged.  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 No clear neural impingement.   Electronically Signed   By: Logan Bores   On: 05/05/2015 19:13    2D ECHO:   Disposition and Follow-up:     Discharge Instructions    Diet Carb Modified    Complete by:  As directed       Discharge instructions    Complete by:  As directed   Pain medications will need to be titrated up, so please see your pain medication MD asap.  It is VERY IMPORTANT that you follow up with a PCP on a regular basis.  Check your blood glucoses before each meal and at bedtime and maintain a log of your readings.  Bring this log with you when you follow up with your PCP so that he or she can adjust your insulin at your follow up visit.     Increase activity slowly    Complete by:  As directed             DISPOSITION: home   DISCHARGE FOLLOW-UP Follow-up Information    Follow up with Faythe Ghee, MD. Schedule an appointment as soon as possible for a visit in 1 week.   Specialty:  Neurosurgery   Why:  for hospital follow-up   Contact information:   [address] [place] [address] [place] [address] [phone]       Follow up with [name], MD. Schedule an appointment as soon as possible for a visit in 10 days.   Specialty:  Internal Medicine   Why:  for hospital follow-up   Contact information:   [address] [place] [address] [phone]        Time spent on Discharge: 35 mins  SignedEstill Cotta M.D. [facility] 05/09/2015, 2:44 PM Pager: [phone]

## 2015-05-09 NOTE — Consult Note (Signed)
Consultation Note Date: 05/09/2015   Patient Name: Steve Phillips  DOB: [birth year]  MRN: [id number]  Age / Sex: 69 y.o., male   PCP: [name], MD Referring Physician: Mendel Corning, MD  Reason for Consultation: Pain control  Palliative Care Assessment and Plan Summary of Established Goals of Care and Medical Treatment Preferences   Clinical Assessment/Narrative: Mr. Fehl and his wife are seeking help with managing lower back, hip and leg pain which started after a cardiac cath (right arm) April 2016. Mr. Eschbach has had falls and decreased ADLs since this time.  They have been seeing Neurosurgery, Dr. Hal Neer, as outpatient. There was discussion of myelogram, per patient.   Mr. Kreuzer has also been seeing interventional pain management Dr. Phyllis Ginger for epidural injections, but can not have further treatments until October.  He also sees Neurology for his Parkinsons, and is changing from Lyrica to gabapentin over the next few weeks. He states his pain has been debilitating over the last 2-3 weeks, mostly a dull ache, but recently started to be "sharp and stabbing" in his left hip/thigh.  He states Dilaudid 1mg  IV has helped for about one hour, and he has been started on Duragesic 25 mcg.  He is to be discharged to SNF as his wife feels that she can not care for him at home.  They have been encouraged to follow up with Neurosurgery as soon as possible, as he is unavailable until 6/21.  Recommendations for pain control as below.    Contacts/Participants in Discussion: Primary Decision Maker:  Mr. Turnage is able to make his own decisions, but relies on his wife for support.  HCPOA: no    Code Status/Advance Care Planning:  Full Code   Symptom Management:   Pain:  Currently receiving Fentanyl 63mcg patch, but having breakthrough pain. Recommend continue Fentanyl patch at 25 mcg, Oxycodone 5mg  PO Q 6 hours PRN.  Voltaren gel QID.   Constipation:  To have suppository and enema today,  scheduled Mira lax, changed to Senna S 2 tabs QHS.    Psycho-social/Spiritual:   Support System:  Mr. Morino and his wife state they have a limited support system, son in law and grandson help with yard work.    Prognosis: Unable to determine  Discharge Planning:  SNF for help with ALD's, and PT.  Goal to return to home.        Chief Complaint: Severe back pain.  History of Present Illness: 69 yo male h/o recent restenting of his LAD with DES on plavix in April 2016, dm poorly controlled, parkinsons with worsening severe lower back pain for the last week. He has been having issues with his back since April also. Has had 3 steroid injections, oral predisone. Problem has persisted, sometimes gets better to where the pain is tolerable, but the last several days have been unbearable despite his home pain meds. Denies any fevers. He has left leg weakness associated with his back pain. No saddle anesthesia or urinary/bowel incontinence. He has been walking with a walker, but unable to walk the last day or so due to severity of pain. MRI today shows no change from previous imaging and shows no impingement of nerve/spine. He has seen neurosurgery Dr. Hal Neer who did his cervical fusion years ago who wanted to obtain a myelogram for which patient has to be off his plavix for, however cardiology has recommended not to stop his plavix due to his recent restenting of his LAD. We are asked to  admit pt for intractable back pain.  Primary Diagnoses  Present on Admission:  . Intractable back pain . CAD (coronary artery disease) . Hypertension . 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 DM (diabetes mellitus), type 2 with neurological complications . Obesity-BMI 35   I have reviewed the medical record, interviewed the patient and family, and examined the patient. The following aspects are pertinent.  Past Medical History  Diagnosis Date  . Shingles   . Diabetes mellitus   .  Hyperlipidemia   . Broken leg     left...s/p pins  . Lower leg pain     chronic ,left leg  . Acute angina   . Depression   . CAD (coronary artery disease)     s/p multiple PCI with stent RCA,LAD and obtuse marginal,followed @ [facility] on the accord study.., cath 02/2012 90% D1, s/p drug-eluting stent Dr. Fletcher [name]  . Falls 09/2011  . Dizziness   . Shortness of breath   . Headache(784.0)   . Hypertension     dr Jerilynn Mages Aud[facility]     [facility] in [place]  . Stroke   . Bell's palsy   . Parkinson disease   . Neuropathy    History   Social History  . Marital Status: Married    Spouse Name: N/A  . Number of Children: N/A  . Years of Education: N/A   Social History Main Topics  . Smoking status: Former Smoker    Quit date: 11/08/1971  . Smokeless tobacco: Never Used  . Alcohol Use: No  . Drug Use: No  . Sexual Activity: Not on file   Other Topics Concern  . None   Social History Narrative   Family History  Problem Relation Age of Onset  . Cancer Mother     BREAST  . Heart disease Mother     STENT  . Cancer Father     THROAT  . Heart disease Sister     STENT; HTN  . Heart attack Brother   . Hypertension Brother   . Hypertension Sister    Scheduled Meds: . amLODipine  5 mg Oral Daily  . aspirin EC  81 mg Oral TID  . carbidopa-levodopa  1 tablet Oral TID  . carvedilol  25 mg Oral BID  . clopidogrel  75 mg Oral Daily  . diclofenac sodium  2 g Topical QID  . fentaNYL  25 mcg Transdermal Q72H  . gabapentin  300 mg Oral QHS  . hydrochlorothiazide  12.5 mg Oral Daily   And  . lisinopril  20 mg Oral Daily  . insulin aspart  0-20 Units Subcutaneous TID WC  . insulin aspart  0-5 Units Subcutaneous QHS  . pantoprazole  40 mg Oral BID  . polyethylene glycol  17 g Oral Daily  . pregabalin  150 mg Oral BH-q7a  . senna-docusate  2 tablet Oral QHS  . simvastatin  20 mg Oral QHS  . sodium chloride  3 mL Intravenous Q12H  . [START ON 05/10/2015] Insulin Regular Human (Conc)  20 Units  Subcutaneous BID WC  . [START ON 05/10/2015] Insulin Regular Human (Conc)  30 Units Subcutaneous Q supper  . Insulin Regular Human (Conc)  22 Units Subcutaneous Q supper   Continuous Infusions:  PRN Meds:.sodium chloride, acetaminophen, hydrALAZINE, HYDROcodone-acetaminophen, HYDROmorphone (DILAUDID) injection, nitroGLYCERIN, ondansetron **OR** ondansetron (ZOFRAN) IV, sodium chloride Medications Prior to Admission:  Prior to Admission medications   Medication Sig Start Date End Date Taking? Authorizing Provider  acetaminophen (TYLENOL) 325 MG tablet Take 2 tablets (650 mg total) by mouth every 4 (four) hours as needed for headache or mild pain. 02/24/15  Yes Luke K Kilroy, PA-C  amLODipine (NORVASC) 5 MG tablet Take 1 tablet (5 mg total) by mouth daily. Patient taking differently: Take 10 mg by mouth daily.  02/16/15  Yes Jackolyn Confer, MD  aspirin EC 81 MG tablet Take 81 mg by mouth 3 (three) times daily.   Yes Historical Provider, MD  carbidopa-levodopa (SINEMET IR) 25-100 MG per tablet Take 2 tablets by mouth 3 (three) times daily. Patient taking differently: Take 1 tablet by mouth 3 (three) times daily.  02/16/15  Yes Jackolyn Confer, MD  carvedilol (COREG) 25 MG tablet TAKE ONE TABLET BY MOUTH TWICE DAILY 03/31/15  Yes Jackolyn Confer, MD  clopidogrel (PLAVIX) 75 MG tablet Take 1 tablet (75 mg total) by mouth daily with breakfast. 02/16/15  Yes Jackolyn Confer, MD  cyanocobalamin (,VITAMIN B-12,) 1000 MCG/ML injection Inject 1,000 mcg into the muscle every 30 (thirty) days.    Yes Historical Provider, MD  furosemide (LASIX) 20 MG tablet Take 1 tablet (20 mg total) by mouth daily. One tablet daily for two days 04/25/15  Yes [name], MD  insulin regular human CONCENTRATED (HUMULIN R U-500 KWIKPEN) 500 UNIT/ML SOLN injection Inject 90 units before each of the 3 meals, under skin, 30 min before meals Patient taking differently: Inject 20-30 Units into the skin 3 (three) times daily  with meals. Takes 20 units in am, 20 units at lunch and 30 units in evening IF CBG BOTTOMS OUT, THEN REDUCING EACH DOSE BY 5 UNITS UNTIL CBG IS NORMAL RANGE 04/05/15  Yes Philemon Kingdom, MD  lisinopril-hydrochlorothiazide (PRINZIDE,ZESTORETIC) 20-12.5 MG per tablet Take 1 tablet by mouth 2 (two) times daily. 10/27/14  Yes Jackolyn Confer, MD  metFORMIN (GLUCOPHAGE) 1000 MG tablet TAKE ONE TABLET BY MOUTH TWICE DAILY 02/26/15  Yes Doreene Burke Kilroy, PA-C  pantoprazole (PROTONIX) 40 MG tablet Take 1 tablet (40 mg total) by mouth 2 (two) times daily. 10/28/14  Yes Jackolyn Confer, MD  simvastatin (ZOCOR) 20 MG tablet Take 1 tablet (20 mg total) by mouth at bedtime. 03/08/15  Yes [name], MD  spironolactone (ALDACTONE) 25 MG tablet Take 2 tablets (50 mg total) by mouth daily. Patient taking differently: Take 25 mg by mouth 2 (two) times daily.  04/29/15  Yes [name], MD  tiZANidine (ZANAFLEX) 4 MG tablet Take 1 tablet (4 mg total) by mouth every 8 (eight) hours as needed. for pain 03/11/15  Yes Rubbie Battiest, NP  docusate sodium (COLACE) 100 MG capsule Take 1 capsule (100 mg total) by mouth 2 (two) times daily. 05/09/15   Ripudeep Krystal Eaton, MD  DULoxetine (CYMBALTA) 60 MG capsule Take 1 capsule (60 mg total) by mouth daily. TAKE 1 CAPSULE DAILY 05/09/15   Ripudeep Krystal Eaton, MD  fentaNYL (DURAGESIC - DOSED MCG/HR) 25 MCG/HR patch Place 1 patch (25 mcg total) onto the skin every 3 (three) days. 05/09/15   Ripudeep Krystal Eaton, MD  gabapentin (NEURONTIN) 300 MG capsule Take 300 mg by mouth at bedtime.    Historical Provider, MD  glucose blood (FREESTYLE TEST STRIPS) test strip Use to check blood sugar up to three times daily 10/27/14   Jackolyn Confer, MD  HYDROcodone-acetaminophen (NORCO/VICODIN) 5-325 MG per tablet Take 1-2 tablets by mouth every 6 (six) hours as needed. 05/09/15   Ripudeep K  Rai, MD  insulin aspart (NOVOLOG) 100 UNIT/ML injection Inject 15 Units into the skin 3 (three) times daily with  meals. 05/09/15   Ripudeep Krystal Eaton, MD  insulin aspart (NOVOLOG) 100 UNIT/ML injection Inject 0-20 Units into the skin 3 (three) times daily with meals. Sliding scale insulin correction factor:CBG < 70: Drink juice; CBG 70-120: 0 units ; CBG 121-150: 3 units; CBG 151-200: 4 units; CBG 201-250: 7 units; CBG 251-300: 11 units; CBG 301-350: 15 units ; CBG 351-400: 20 units ; CBG > 400: please call your physician 05/09/15   Ripudeep Krystal Eaton, MD  insulin glargine (LANTUS) 100 UNIT/ML injection Inject 0.65 mLs (65 Units total) into the skin 2 (two) times daily. 05/09/15   Ripudeep Krystal Eaton, MD  Insulin Glargine (TOUJEO SOLOSTAR) 300 UNIT/ML SOPN Inject 55 Units into the skin 2 (two) times daily at 8 am and 10 pm. Patient taking differently: Inject 70 Units into the skin 2 (two) times daily at 8 am and 10 pm.  01/28/15   Philemon Kingdom, MD  insulin lispro (HUMALOG) 100 UNIT/ML injection Inject 0.5-0.55 mLs (50-55 Units total) into the skin 3 (three) times daily before meals. 04/04/15   Philemon Kingdom, MD  nitroGLYCERIN (NITROSTAT) 0.4 MG SL tablet Place 1 tablet (0.4 mg total) under the tongue every 5 (five) minutes as needed for chest pain. 05/25/13   Jackolyn Confer, MD  polyethylene glycol (MIRALAX / GLYCOLAX) packet Take 17 g by mouth daily. 05/09/15   Ripudeep Krystal Eaton, MD  predniSONE (DELTASONE) 20 MG tablet Take 1 tablet (20 mg total) by mouth daily with breakfast. Patient not taking: Reported on 05/05/2015 04/04/15   Jackolyn Confer, MD  pregabalin (LYRICA) 150 MG capsule Take 1 capsule (150 mg total) by mouth every morning. 05/09/15   Ripudeep Krystal Eaton, MD  senna (SENOKOT) 8.6 MG TABS tablet Take 1 tablet (8.6 mg total) by mouth daily. 05/09/15   Ripudeep Krystal Eaton, MD   Allergies  Allergen Reactions  . Codeine Nausea Only    Derivatives -Nausea.  . Tramadol Other (See Comments)    unknown  . Trazodone And Nefazodone Nausea Only   CBC:    Component Value Date/Time   WBC 8.2 05/09/2015 0550   WBC 12.8*  03/05/2015 0116   HGB 13.8 05/09/2015 0550   HGB 13.8 03/05/2015 0116   HCT 40.6 05/09/2015 0550   HCT 43.3 03/05/2015 0116   PLT 190 05/09/2015 0550   PLT 260 03/05/2015 0116   MCV 75.7* 05/09/2015 0550   MCV 75* 03/05/2015 0116   NEUTROABS 8.0* 05/05/2015 1657   NEUTROABS 11.8* 03/05/2015 0116   LYMPHSABS 2.0 05/05/2015 1657   LYMPHSABS 0.8* 03/05/2015 0116   MONOABS 0.7 05/05/2015 1657   MONOABS 0.1* 03/05/2015 0116   EOSABS 0.3 05/05/2015 1657   EOSABS 0.0 03/05/2015 0116   BASOSABS 0.0 05/05/2015 1657   BASOSABS 0.1 03/05/2015 0116   Comprehensive Metabolic Panel:    Component Value Date/Time   NA 133* 05/09/2015 0550   NA 140 04/27/2015 0837   NA 136 03/05/2015 0116   K 4.4 05/09/2015 0550   K 4.3 03/05/2015 0116   CL 96* 05/09/2015 0550   CL 103 03/05/2015 0116   CO2 27 05/09/2015 0550   CO2 26 03/05/2015 0116   BUN 24* 05/09/2015 0550   BUN 17 04/27/2015 0837   BUN 16 03/05/2015 0116   CREATININE 1.32* 05/09/2015 0550   CREATININE 1.33* 03/05/2015 0116   CREATININE 0.89 05/14/2014 1625  CREATININE 0.7 05/18/2011 1246   GLUCOSE 415* 05/09/2015 0550   GLUCOSE 138* 04/27/2015 0837   GLUCOSE 387* 03/05/2015 0116   CALCIUM 8.4* 05/09/2015 0550   CALCIUM 8.3* 03/05/2015 0116   AST 27 02/26/2015 1606   AST 19 02/16/2015 1435   ALT 7* 02/26/2015 1606   ALT 7 02/16/2015 1435   ALKPHOS 63 02/26/2015 1606   ALKPHOS 67 02/16/2015 1435   BILITOT 0.4 02/16/2015 1435   PROT 6.3* 02/26/2015 1606   PROT 6.3 02/16/2015 1435   ALBUMIN 3.5 02/26/2015 1606   ALBUMIN 3.5 02/16/2015 1435    Physical Exam: Vital Signs: BP 161/75 mmHg  Pulse 72  Temp(Src) 97.8 F (36.6 C) (Oral)  Resp 16  Ht 6' (1.829 m)  Wt 118.797 kg (261 lb 14.4 oz)  BMI 35.51 kg/m2  SpO2 97% SpO2: SpO2: 97 % O2 Device: O2 Device: Not Delivered O2 Flow Rate:   Intake/output summary:  Intake/Output Summary (Last 24 hours) at 05/09/15 1313 Last data filed at 05/09/15 0600  Gross per 24 hour    Intake    480 ml  Output   1250 ml  Net   -770 ml   LBM: Last BM Date: 05/05/15 Baseline Weight: Weight: 73.029 kg (161 lb) Most recent weight: Weight: 118.797 kg (261 lb 14.4 oz)  Exam Findings:  Gen: Obese, no acute distress Resp: even resp,  Abd: soft distended  Musc: MAE X 4, weakness left leg          Palliative Performance Scale: 50% at best                Additional Data Reviewed: Recent Labs     05/08/15  0515  05/09/15  0550  WBC  11.6*  8.2  HGB  13.8  13.8  PLT  185  190  NA  137  133*  BUN  27*  24*  CREATININE  1.52*  1.32*     Time In: 1100 Time Out: 1200 Time Total: 60 minutes Greater than 50%  of this time was spent counseling and coordinating care related to the above assessment and plan.  Discussion with SW.  Thank you for consulting the PMT.   Signed by: Drue Novel, NP  Drue Novel, NP  05/09/2015, 1:13 PM  Please contact Palliative Medicine Team phone at [phone] for questions and concerns.

## 2015-05-09 NOTE — Progress Notes (Signed)
Steve Phillips to be D/C'd Home per MD order.  Discussed with the patient and all questions fully answered.  VSS, Skin clean, dry and intact without evidence of skin break down, no evidence of skin tears noted but pt does have dry flaky skin with come ecchymosis. IV catheter discontinued intact. Site without signs and symptoms of complications. Dressing and pressure applied.  An After Visit Summary was printed and given to the patient. Patient received prescription. Pt's wife shared that she will be talking with the pharmacist about the cost of the prescriptions but that she will plan on getting all medication filled at this time.   D/c education completed with patient/family including follow up instructions, medication list, d/c activities limitations if indicated, with other d/c instructions as indicated by MD - patient and pt's wife able to verbalize understanding, all questions fully answered.   Patient instructed to return to ED, call 911, or call MD for any changes in condition.   Patient will be discharged home via non-emergent ambulance due to patient having trouble with mobility and issues being able to get in and out of car and in and out of house. Pt will be getting home health physical therapy to regain his strength.   Norva Karvonen 05/09/2015 4:08 PM

## 2015-05-09 NOTE — Clinical Social Work Note (Signed)
Patient needs non emergency ambulance transport due to medical necessity per physician, CSW contacted [facility] to schedule EMS transportation.  CSW to sign off, patient discharging home with home health via ambulance transportation, medical necessity form completed and MD order included with discharge paperwork.  Jones Broom. Jennings, MSW, [place] 05/09/2015 5:43 PM

## 2015-05-10 ENCOUNTER — Other Ambulatory Visit: Payer: Self-pay | Admitting: Neurology

## 2015-05-10 DIAGNOSIS — G20 Parkinson's disease: Secondary | ICD-10-CM | POA: Diagnosis not present

## 2015-05-10 DIAGNOSIS — Z6835 Body mass index (BMI) 35.0-35.9, adult: Secondary | ICD-10-CM | POA: Diagnosis not present

## 2015-05-10 DIAGNOSIS — M542 Cervicalgia: Secondary | ICD-10-CM

## 2015-05-10 DIAGNOSIS — I10 Essential (primary) hypertension: Secondary | ICD-10-CM | POA: Diagnosis not present

## 2015-05-10 DIAGNOSIS — M5136 Other intervertebral disc degeneration, lumbar region: Secondary | ICD-10-CM | POA: Diagnosis not present

## 2015-05-10 DIAGNOSIS — E1149 Type 2 diabetes mellitus with other diabetic neurological complication: Secondary | ICD-10-CM | POA: Diagnosis not present

## 2015-05-10 DIAGNOSIS — E669 Obesity, unspecified: Secondary | ICD-10-CM | POA: Diagnosis not present

## 2015-05-10 DIAGNOSIS — M6281 Muscle weakness (generalized): Secondary | ICD-10-CM

## 2015-05-10 DIAGNOSIS — G629 Polyneuropathy, unspecified: Secondary | ICD-10-CM | POA: Diagnosis not present

## 2015-05-10 DIAGNOSIS — R200 Anesthesia of skin: Secondary | ICD-10-CM

## 2015-05-10 DIAGNOSIS — M545 Low back pain, unspecified: Secondary | ICD-10-CM

## 2015-05-10 DIAGNOSIS — I2510 Atherosclerotic heart disease of native coronary artery without angina pectoris: Secondary | ICD-10-CM | POA: Diagnosis not present

## 2015-05-10 DIAGNOSIS — Z95818 Presence of other cardiac implants and grafts: Secondary | ICD-10-CM | POA: Diagnosis not present

## 2015-05-11 ENCOUNTER — Encounter: Payer: Self-pay | Admitting: Internal Medicine

## 2015-05-11 ENCOUNTER — Telehealth: Payer: Self-pay | Admitting: *Deleted

## 2015-05-11 DIAGNOSIS — Z6834 Body mass index (BMI) 34.0-34.9, adult: Secondary | ICD-10-CM | POA: Diagnosis not present

## 2015-05-11 DIAGNOSIS — M5416 Radiculopathy, lumbar region: Secondary | ICD-10-CM | POA: Diagnosis not present

## 2015-05-11 NOTE — Telephone Encounter (Signed)
Transition Care Management Follow-up Telephone Call  How have you been since you were released from the hospital? Per pt's wife "not doing well, cant walk on his own, in a lot of pain"    Do you understand why you were in the hospital? YES   Do you understand the discharge instrcutions? YES  Items Reviewed:  WILL REVIEW DURING OV TOMORROW, SPOUSE WAS IN A HURRY TO GET OFF THE PHONE  Medications reviewed: NO  Allergies reviewed: NO  Dietary changes reviewed: NO  Referrals reviewed: NO   Functional Questionnaire:   Activities of Daily Living (ADLs):   He states they are independent in the following:  States they require assistance with the following:    Any transportation issues/concerns?: NO   Any patient concerns? NO   Confirmed importance and date/time of follow-up visits scheduled: YES - Tomorrow 05/12/15 @ 4:00   Confirmed with patient if condition begins to worsen call PCP or go to the ER.  Patient was given the Call-a-Nurse line [phone]: NO

## 2015-05-12 ENCOUNTER — Encounter: Payer: Self-pay | Admitting: Internal Medicine

## 2015-05-12 ENCOUNTER — Encounter: Payer: Self-pay | Admitting: Cardiovascular Disease

## 2015-05-12 ENCOUNTER — Ambulatory Visit (INDEPENDENT_AMBULATORY_CARE_PROVIDER_SITE_OTHER): Payer: Medicare Other | Admitting: Cardiovascular Disease

## 2015-05-12 ENCOUNTER — Ambulatory Visit (INDEPENDENT_AMBULATORY_CARE_PROVIDER_SITE_OTHER): Payer: Medicare Other | Admitting: Internal Medicine

## 2015-05-12 VITALS — BP 138/80 | HR 68 | Ht 72.0 in | Wt 260.0 lb

## 2015-05-12 VITALS — BP 138/60 | HR 71 | Temp 97.7°F | Resp 18 | Ht 72.0 in | Wt 260.0 lb

## 2015-05-12 DIAGNOSIS — E785 Hyperlipidemia, unspecified: Secondary | ICD-10-CM | POA: Diagnosis not present

## 2015-05-12 DIAGNOSIS — R159 Full incontinence of feces: Secondary | ICD-10-CM | POA: Insufficient documentation

## 2015-05-12 DIAGNOSIS — I200 Unstable angina: Secondary | ICD-10-CM

## 2015-05-12 DIAGNOSIS — R269 Unspecified abnormalities of gait and mobility: Secondary | ICD-10-CM

## 2015-05-12 DIAGNOSIS — G894 Chronic pain syndrome: Secondary | ICD-10-CM | POA: Insufficient documentation

## 2015-05-12 DIAGNOSIS — G629 Polyneuropathy, unspecified: Secondary | ICD-10-CM | POA: Diagnosis not present

## 2015-05-12 DIAGNOSIS — I10 Essential (primary) hypertension: Secondary | ICD-10-CM | POA: Diagnosis not present

## 2015-05-12 DIAGNOSIS — I2583 Coronary atherosclerosis due to lipid rich plaque: Secondary | ICD-10-CM

## 2015-05-12 DIAGNOSIS — I2510 Atherosclerotic heart disease of native coronary artery without angina pectoris: Secondary | ICD-10-CM

## 2015-05-12 DIAGNOSIS — M545 Low back pain: Secondary | ICD-10-CM | POA: Diagnosis not present

## 2015-05-12 DIAGNOSIS — M5136 Other intervertebral disc degeneration, lumbar region: Secondary | ICD-10-CM | POA: Diagnosis not present

## 2015-05-12 DIAGNOSIS — E1149 Type 2 diabetes mellitus with other diabetic neurological complication: Secondary | ICD-10-CM | POA: Diagnosis not present

## 2015-05-12 DIAGNOSIS — G20 Parkinson's disease: Secondary | ICD-10-CM | POA: Diagnosis not present

## 2015-05-12 NOTE — Assessment & Plan Note (Signed)
No recurrent angina. Continue medical therapy. The biggest issue has been debilitating back pain with left leg weakness which started in April and has been getting worse since then. The exact etiology has not been clear with unremarkable MRI. If a myelogram is considered necessary to diagnose the patient's condition, Plavix can be stopped 5-7 days before the procedure and resumed few days after once deemed safe from a bleeding standpoint. Obviously, this is not an ideal situation given that he had a drug-eluting stent placement in April. Nonetheless, it has been more than 3 months and there is reasonable safety data with newer generation drug-eluting stents in terms of interruption of dual antiplatelets therapy 3-6 months after stent implantation. Aspirin 81 mg once daily should be continued. If aspirin also needs to be stopped, then the patient will require hospital admission and possibly bridging with IV Cangrelor.  I think no matter what we do, there will be a small risk of stent thrombosis. I explained this to the patient and his wife.  I will forward this to Dr. Hal Neer.

## 2015-05-12 NOTE — Assessment & Plan Note (Signed)
Blood pressure is controlled on current medications. His blood pressure goes up when he is under significant pain.

## 2015-05-12 NOTE — Assessment & Plan Note (Signed)
Pt is unable to stand because of weakness and pain in his left leg. He is requiring maximal assist at home from family members to transfer to bed and commode. He is at extremely high falls risk. In my opinion, he is unsafe at home and will need a skilled nursing facility for 24/7 assistance.

## 2015-05-12 NOTE — Progress Notes (Signed)
Subjective:    Patient ID: Steve Phillips, male    DOB: 05-26-1946, 69 y.o.   MRN: [id number]  HPI  68YO male presents for hospital follow up.  ADMITTED: 05/05/2015 DISCHARGED: 05/09/2015  DIAGNOSIS: Intractable Leg Pain, CAD, HTN  Last week, had severe left leg pain and was unable to stand. Called EMS. Observation at [facility].   Started on Fentanyl patch for pain control. He reports little improvement with this. Stopped the patch on arrival home because of nausea. Transitioning off Lyrica and starting Neurontin under direction of neurology. Using Hydrocodone for severe pain. Pain continues to be severe, rated at 8/10, even at rest. Radiates down from low back to left foot. Described as constant tooth ache. With Hydrocodone, little or no improvement.  Over last two days has developed bowel incontinence. Given left leg weakness, he is unable to lift himself to use commode, and family, including his wife, have been trying to lift him to clean him.  Follow up with Dr. Hal Neer pending. Seen by Dr. Fletcher [name] who cleared him to hold Plavix for Myelogram.  Past medical, surgical, family and social history per today's encounter.  Review of Systems  Constitutional: Positive for fatigue. Negative for fever, chills, activity change, appetite change and unexpected weight change.  Eyes: Negative for visual disturbance.  Respiratory: Negative for cough and shortness of breath.   Cardiovascular: Negative for chest pain, palpitations and leg swelling.  Gastrointestinal: Negative for nausea, vomiting, abdominal pain, diarrhea, constipation and abdominal distention.  Genitourinary: Negative for dysuria, urgency and difficulty urinating.  Musculoskeletal: Positive for myalgias, back pain, arthralgias and gait problem.  Skin: Positive for wound (left lower leg). Negative for color change and rash.  Neurological: Positive for weakness.  Hematological: Negative for adenopathy.    Psychiatric/Behavioral: Positive for sleep disturbance and dysphoric mood. Negative for suicidal ideas. The patient is not nervous/anxious.        Objective:    BP 138/60 mmHg  Pulse 71  Temp(Src) 97.7 F (36.5 C)  Resp 18  Ht 6' (1.829 m)  Wt 260 lb (117.935 kg)  BMI 35.25 kg/m2  SpO2 97% Physical Exam  Constitutional: He is oriented to person, place, and time. He appears well-developed and well-nourished. No distress.  HENT:  Head: Normocephalic and atraumatic.  Right Ear: External ear normal.  Left Ear: External ear normal.  Nose: Nose normal.  Mouth/Throat: Oropharynx is clear and moist. No oropharyngeal exudate.  Eyes: Conjunctivae and EOM are normal. Pupils are equal, round, and reactive to light. Right eye exhibits no discharge. Left eye exhibits no discharge. No scleral icterus.  Neck: Normal range of motion. Neck supple. No tracheal deviation present. No thyromegaly present.  Cardiovascular: Normal rate, regular rhythm and normal heart sounds.  Exam reveals no gallop and no friction rub.   No murmur heard. Pulmonary/Chest: Effort normal and breath sounds normal. No accessory muscle usage. No tachypnea. No respiratory distress. He has no decreased breath sounds. He has no wheezes. He has no rhonchi. He has no rales. He exhibits no tenderness.  Musculoskeletal: Normal range of motion. He exhibits no edema.  Lymphadenopathy:    He has no cervical adenopathy.  Neurological: He is alert and oriented to person, place, and time. A sensory deficit is present. No cranial nerve deficit. He exhibits abnormal muscle tone. Coordination and gait abnormal.  Left foot flex/ext 4/5, left lower leg flex ext 4/5  Skin: Skin is warm and dry. No rash noted. He is not diaphoretic. No erythema.  No pallor.     Psychiatric: He has a normal mood and affect. His behavior is normal. Judgment and thought content normal.          Assessment & Plan:  Over 57min of which >50% spent in  face-to-face contact with patient discussing plan of care  Problem List Items Addressed This Visit      High   Gait abnormality    Pt is unable to stand because of weakness and pain in his left leg. He is requiring maximal assist at home from family members to transfer to bed and commode. He is at extremely high falls risk. In my opinion, he is unsafe at home and will need a skilled nursing facility for 24/7 assistance.        Unprioritized   Bowel incontinence    New symptom of bowel incontinence in setting of severe back pain. Recommended ED evaluation. May need repeat MRI lumbar spine to assess for cord compression.      Chronic pain syndrome - Primary    Severe low back and left leg pain x 3 months. Suspect lumbar radiculopathy given severe left leg pain and weakness. New symptom of bowel incontinence today concerning for cord compression, however no new sensory findings. Pain is poorly control with oral narcotics, lyrica, neurontin, and cymbalta. Pt was unable to tolerate Fentanyl. Recommend ED evaluation and admission for pain control and further evaluation by neurosurgery with possible myelogram.           Return in about 4 weeks (around 06/09/2015) for Recheck.

## 2015-05-12 NOTE — Assessment & Plan Note (Signed)
Lab Results  Component Value Date   CHOL 189 11/16/2014   HDL 38.90* 11/16/2014   LDLCALC 25 05/14/2014   LDLDIRECT 116.0 11/16/2014   TRIG 306.0* 11/16/2014   CHOLHDL 5 11/16/2014   Continue treatment with simvastatin. This cannot be increased given that he is on amlodipine. We should strongly consider switching him to atorvastatin or rosuvastatin.

## 2015-05-12 NOTE — Progress Notes (Signed)
Pre visit review using our clinic review tool, if applicable. No additional management support is needed unless otherwise documented below in the visit note. 

## 2015-05-12 NOTE — Progress Notes (Signed)
HPI  This is a 69 year old male who is here today for followup visit regarding coronary artery disease. He has known history of coronary artery disease status post multiple PCI in the past at [facility]. He presented in April of 2013 with chest pain. He underwent cardiac catheterization which showed patent stents. However, there was a 90% stenosis in the proximal first diagonal. There was 40% disease in the mid LAD and moderate disease in the left circumflex. He underwent an angioplasty and drug-eluting stent placement to the diagonal without complications.  he has known history of refractory hypertension. Renal artery duplex ultrasound showed no evidence of renal artery stenosis. He was seen recently for chest pain with minimal activities worrisome for class III angina. I proceeded with cardiac catheterization in April which showed patent stents in the RCA, left circumflex and first diagonal. There was severe in-stent restenosis in the mid LAD. I performed successful angioplasty and drug-eluting stent placement to the mid LAD. The patient was noted to have diffuse diabetic vessels.  He has suffered from severe back pain since April which required multiple hospital admissions most recently last week at [facility]. MRI of the spine, hip and pelvis showed no significant abnormalities to explain his symptoms. He was seen by neurosurgery. A myelogram was initially considered but was not performed. The patient continues to feel very limited and describes leg weakness especially on the left side with inability to walk. He is using a wheelchair.   Allergies  Allergen Reactions  . Codeine Nausea Only    Derivatives -Nausea.  . Tramadol Other (See Comments)    unknown  . Trazodone And Nefazodone Nausea Only     Current Outpatient Prescriptions on File Prior to Visit  Medication Sig Dispense Refill  . acetaminophen (TYLENOL) 325 MG tablet Take 2 tablets (650 mg total) by mouth every 4 (four) hours  as needed for headache or mild pain.    Marland Kitchen amLODipine (NORVASC) 5 MG tablet Take 1 tablet (5 mg total) by mouth daily. (Patient taking differently: Take 10 mg by mouth daily. ) 90 tablet 3  . aspirin EC 81 MG tablet Take 81 mg by mouth 3 (three) times daily.    . carbidopa-levodopa (SINEMET IR) 25-100 MG per tablet Take 2 tablets by mouth 3 (three) times daily. (Patient taking differently: Take 1 tablet by mouth 3 (three) times daily. ) 270 tablet 2  . carvedilol (COREG) 25 MG tablet TAKE ONE TABLET BY MOUTH TWICE DAILY 180 tablet 1  . clopidogrel (PLAVIX) 75 MG tablet Take 1 tablet (75 mg total) by mouth daily with breakfast. 90 tablet 1  . cyanocobalamin (,VITAMIN B-12,) 1000 MCG/ML injection Inject 1,000 mcg into the muscle every 30 (thirty) days.     . diclofenac sodium (VOLTAREN) 1 % GEL Apply 2 g topically 4 (four) times daily. 2 Tube 3  . docusate sodium (COLACE) 100 MG capsule Take 1 capsule (100 mg total) by mouth 2 (two) times daily. 60 capsule 0  . DULoxetine (CYMBALTA) 60 MG capsule Take 1 capsule (60 mg total) by mouth daily. TAKE 1 CAPSULE DAILY 30 capsule 1  . fentaNYL (DURAGESIC - DOSED MCG/HR) 25 MCG/HR patch Place 1 patch (25 mcg total) onto the skin every 3 (three) days. 10 patch 0  . gabapentin (NEURONTIN) 300 MG capsule Take 300 mg by mouth at bedtime.    Marland Kitchen lisinopril-hydrochlorothiazide (PRINZIDE,ZESTORETIC) 20-12.5 MG per tablet Take 1 tablet by mouth 2 (two) times daily. 180 tablet 1  .  metFORMIN (GLUCOPHAGE) 1000 MG tablet TAKE ONE TABLET BY MOUTH TWICE DAILY 180 tablet 1  . nitroGLYCERIN (NITROSTAT) 0.4 MG SL tablet Place 1 tablet (0.4 mg total) under the tongue every 5 (five) minutes as needed for chest pain. 30 tablet 6  . pantoprazole (PROTONIX) 40 MG tablet Take 1 tablet (40 mg total) by mouth 2 (two) times daily. 180 tablet 3  . polyethylene glycol (MIRALAX / GLYCOLAX) packet Take 17 g by mouth daily. 30 each 0  . pregabalin (LYRICA) 150 MG capsule Take 1 capsule (150  mg total) by mouth every morning. 7 capsule 0  . promethazine (PHENERGAN) 12.5 MG tablet Take 1 tablet (12.5 mg total) by mouth every 6 (six) hours as needed for nausea or vomiting. 30 tablet 0  . senna (SENOKOT) 8.6 MG TABS tablet Take 1 tablet (8.6 mg total) by mouth daily. 120 each 0  . simvastatin (ZOCOR) 20 MG tablet Take 1 tablet (20 mg total) by mouth at bedtime. 90 tablet 3   No current facility-administered medications on file prior to visit.     Past Medical History  Diagnosis Date  . Shingles   . Diabetes mellitus   . Hyperlipidemia   . Broken leg     left...s/p pins  . Lower leg pain     chronic ,left leg  . Acute angina   . Depression   . CAD (coronary artery disease)     s/p multiple PCI with stent RCA,LAD and obtuse marginal,followed @ [facility] on the accord study.., cath 02/2012 90% D1, s/p drug-eluting stent Dr. Fletcher [name]  . Falls 09/2011  . Dizziness   . Shortness of breath   . Headache(784.0)   . Hypertension     dr Jerilynn Mages Audelia Acton     [facility] in [place]  . Stroke   . Bell's palsy   . Parkinson disease   . Neuropathy      Past Surgical History  Procedure Laterality Date  . Appendectomy    . Cholecystectomy    . Left leg surgery    . Left foot surgery    . Drainage port in left testicle    . Cardiac catheterization  02/2012  . Back surgery    . Anterior cervical decomp/discectomy fusion N/A 04/30/2013    Procedure: ANTERIOR CERVICAL DECOMPRESSION/DISCECTOMY FUSION 2 LEVELS;  Surgeon: Faythe Ghee, MD;  Location: [facility];  Service: Neurosurgery;  Laterality: N/A;  Cervical four-five, Cervical six-seven anterior cervical decompression fusion with trabecular metal cage and plate  . Left heart catheterization with coronary angiogram N/A 02/23/2015    Procedure: LEFT HEART CATHETERIZATION WITH CORONARY ANGIOGRAM;  Surgeon: [name], MD;  Location: [facility];  Service: Cardiovascular;  Laterality: N/A;     Family History  Problem Relation Age of Onset    . Cancer Mother     BREAST  . Heart disease Mother     STENT  . Cancer Father     THROAT  . Heart disease Sister     STENT; HTN  . Heart attack Brother   . Hypertension Brother   . Hypertension Sister      History   Social History  . Marital Status: Married    Spouse Name: N/A  . Number of Children: N/A  . Years of Education: N/A   Occupational History  . Not on file.   Social History Main Topics  . Smoking status: Former Smoker    Quit date: 11/08/1971  . Smokeless tobacco: Never  Used  . Alcohol Use: No  . Drug Use: No  . Sexual Activity: Not on file   Other Topics Concern  . Not on file   Social History Narrative      PHYSICAL EXAM   BP 138/80 mmHg  Pulse 68  Ht 6' (1.829 m)  Wt 260 lb (117.935 kg)  BMI 35.25 kg/m2 Constitutional: He is oriented to person, place, and time. He appears well-developed and well-nourished. No distress.  HENT: No nasal discharge.  Head: Normocephalic and atraumatic.  Eyes: Pupils are equal and round. Right eye exhibits no discharge. Left eye exhibits no discharge.  Neck: Normal range of motion. Neck supple. No JVD present. No thyromegaly present.  Cardiovascular: Normal rate, regular rhythm, normal heart sounds and. Exam reveals no gallop and no friction rub. No murmur heard.  Pulmonary/Chest: Effort normal and breath sounds normal. No stridor. No respiratory distress. He has no wheezes. He has no rales. He exhibits no tenderness.  Abdominal: Soft. Bowel sounds are normal. He exhibits no distension. There is no tenderness. There is no rebound and no guarding.  Musculoskeletal: Normal range of motion. He exhibits no edema and no tenderness.  Neurological: He is alert and oriented to person, place, and time. Coordination normal.  Skin: Skin is warm and dry. No rash noted. He is not diaphoretic. No erythema. No pallor.  Psychiatric: He has a normal mood and affect. His behavior is normal. Judgment and thought content normal.      EKG: Sinus  Rhythm  Low voltage in limb leads.   -  Nonspecific T-abnormality.   ABNORMAL       ASSESSMENT AND PLAN

## 2015-05-12 NOTE — Patient Instructions (Signed)
Given intractable pain despite outpatient treatment with oral narcotics, and persistent weakness left leg with high falls risk, recommended ED evaluation.

## 2015-05-12 NOTE — Assessment & Plan Note (Signed)
Severe low back and left leg pain x 3 months. Suspect lumbar radiculopathy given severe left leg pain and weakness. New symptom of bowel incontinence today concerning for cord compression, however no new sensory findings. Pain is poorly control with oral narcotics, lyrica, neurontin, and cymbalta. Pt was unable to tolerate Fentanyl. Recommend ED evaluation and admission for pain control and further evaluation by neurosurgery with possible myelogram.

## 2015-05-12 NOTE — Patient Instructions (Signed)
Medication Instructions: Continue same medications.   Labwork: None.   Procedures/Testing: None.   Follow-Up: 3 months with Dr. Tahliyah Anagnos.   Any Additional Special Instructions Will Be Listed Below (If Applicable).   

## 2015-05-12 NOTE — Assessment & Plan Note (Signed)
New symptom of bowel incontinence in setting of severe back pain. Recommended ED evaluation. May need repeat MRI lumbar spine to assess for cord compression.

## 2015-05-13 ENCOUNTER — Encounter: Payer: Self-pay | Admitting: Internal Medicine

## 2015-05-13 ENCOUNTER — Encounter (HOSPITAL_COMMUNITY): Payer: Self-pay | Admitting: Emergency Medicine

## 2015-05-13 ENCOUNTER — Emergency Department (HOSPITAL_COMMUNITY): Payer: Medicare Other

## 2015-05-13 ENCOUNTER — Inpatient Hospital Stay (HOSPITAL_COMMUNITY)
Admission: EM | Admit: 2015-05-13 | Discharge: 2015-05-19 | DRG: 552 | Disposition: A | Discharge status: Skilled Nursing Facility | Payer: Medicare Other | Attending: Internal Medicine | Admitting: Internal Medicine

## 2015-05-13 DIAGNOSIS — R29898 Other symptoms and signs involving the musculoskeletal system: Secondary | ICD-10-CM | POA: Diagnosis not present

## 2015-05-13 DIAGNOSIS — G629 Polyneuropathy, unspecified: Secondary | ICD-10-CM | POA: Diagnosis present

## 2015-05-13 DIAGNOSIS — T380X5A Adverse effect of glucocorticoids and synthetic analogues, initial encounter: Secondary | ICD-10-CM | POA: Diagnosis present

## 2015-05-13 DIAGNOSIS — E1165 Type 2 diabetes mellitus with hyperglycemia: Secondary | ICD-10-CM | POA: Diagnosis present

## 2015-05-13 DIAGNOSIS — Z87891 Personal history of nicotine dependence: Secondary | ICD-10-CM

## 2015-05-13 DIAGNOSIS — M4716 Other spondylosis with myelopathy, lumbar region: Secondary | ICD-10-CM | POA: Diagnosis not present

## 2015-05-13 DIAGNOSIS — R159 Full incontinence of feces: Secondary | ICD-10-CM | POA: Diagnosis present

## 2015-05-13 DIAGNOSIS — T82897A Other specified complication of cardiac prosthetic devices, implants and grafts, initial encounter: Secondary | ICD-10-CM | POA: Diagnosis present

## 2015-05-13 DIAGNOSIS — M5136 Other intervertebral disc degeneration, lumbar region: Secondary | ICD-10-CM | POA: Diagnosis not present

## 2015-05-13 DIAGNOSIS — E11649 Type 2 diabetes mellitus with hypoglycemia without coma: Secondary | ICD-10-CM | POA: Diagnosis present

## 2015-05-13 DIAGNOSIS — E1149 Type 2 diabetes mellitus with other diabetic neurological complication: Secondary | ICD-10-CM | POA: Diagnosis present

## 2015-05-13 DIAGNOSIS — R531 Weakness: Secondary | ICD-10-CM | POA: Diagnosis not present

## 2015-05-13 DIAGNOSIS — R32 Unspecified urinary incontinence: Secondary | ICD-10-CM | POA: Diagnosis not present

## 2015-05-13 DIAGNOSIS — M5410 Radiculopathy, site unspecified: Secondary | ICD-10-CM | POA: Diagnosis not present

## 2015-05-13 DIAGNOSIS — M1990 Unspecified osteoarthritis, unspecified site: Secondary | ICD-10-CM | POA: Diagnosis present

## 2015-05-13 DIAGNOSIS — N138 Other obstructive and reflux uropathy: Secondary | ICD-10-CM | POA: Diagnosis present

## 2015-05-13 DIAGNOSIS — M4806 Spinal stenosis, lumbar region: Secondary | ICD-10-CM | POA: Diagnosis not present

## 2015-05-13 DIAGNOSIS — N401 Enlarged prostate with lower urinary tract symptoms: Secondary | ICD-10-CM | POA: Diagnosis present

## 2015-05-13 DIAGNOSIS — F0150 Vascular dementia without behavioral disturbance: Secondary | ICD-10-CM | POA: Diagnosis present

## 2015-05-13 DIAGNOSIS — M5127 Other intervertebral disc displacement, lumbosacral region: Secondary | ICD-10-CM | POA: Diagnosis present

## 2015-05-13 DIAGNOSIS — M5416 Radiculopathy, lumbar region: Secondary | ICD-10-CM | POA: Diagnosis present

## 2015-05-13 DIAGNOSIS — I8291 Chronic embolism and thrombosis of unspecified vein: Secondary | ICD-10-CM | POA: Diagnosis present

## 2015-05-13 DIAGNOSIS — G9520 Unspecified cord compression: Secondary | ICD-10-CM | POA: Diagnosis present

## 2015-05-13 DIAGNOSIS — E669 Obesity, unspecified: Secondary | ICD-10-CM | POA: Diagnosis present

## 2015-05-13 DIAGNOSIS — E785 Hyperlipidemia, unspecified: Secondary | ICD-10-CM | POA: Diagnosis present

## 2015-05-13 DIAGNOSIS — Z7902 Long term (current) use of antithrombotics/antiplatelets: Secondary | ICD-10-CM | POA: Diagnosis not present

## 2015-05-13 DIAGNOSIS — Z955 Presence of coronary angioplasty implant and graft: Secondary | ICD-10-CM

## 2015-05-13 DIAGNOSIS — Z7982 Long term (current) use of aspirin: Secondary | ICD-10-CM | POA: Diagnosis not present

## 2015-05-13 DIAGNOSIS — M47816 Spondylosis without myelopathy or radiculopathy, lumbar region: Secondary | ICD-10-CM | POA: Diagnosis not present

## 2015-05-13 DIAGNOSIS — R200 Anesthesia of skin: Secondary | ICD-10-CM | POA: Diagnosis not present

## 2015-05-13 DIAGNOSIS — I2510 Atherosclerotic heart disease of native coronary artery without angina pectoris: Secondary | ICD-10-CM | POA: Diagnosis not present

## 2015-05-13 DIAGNOSIS — E1142 Type 2 diabetes mellitus with diabetic polyneuropathy: Secondary | ICD-10-CM | POA: Diagnosis present

## 2015-05-13 DIAGNOSIS — I10 Essential (primary) hypertension: Secondary | ICD-10-CM | POA: Diagnosis not present

## 2015-05-13 DIAGNOSIS — M5126 Other intervertebral disc displacement, lumbar region: Secondary | ICD-10-CM | POA: Diagnosis not present

## 2015-05-13 DIAGNOSIS — Z6835 Body mass index (BMI) 35.0-35.9, adult: Secondary | ICD-10-CM | POA: Diagnosis not present

## 2015-05-13 DIAGNOSIS — R972 Elevated prostate specific antigen [PSA]: Secondary | ICD-10-CM | POA: Diagnosis present

## 2015-05-13 DIAGNOSIS — Z8249 Family history of ischemic heart disease and other diseases of the circulatory system: Secondary | ICD-10-CM

## 2015-05-13 DIAGNOSIS — Z8673 Personal history of transient ischemic attack (TIA), and cerebral infarction without residual deficits: Secondary | ICD-10-CM | POA: Diagnosis not present

## 2015-05-13 DIAGNOSIS — G3183 Dementia with Lewy bodies: Secondary | ICD-10-CM | POA: Diagnosis present

## 2015-05-13 DIAGNOSIS — M5442 Lumbago with sciatica, left side: Secondary | ICD-10-CM

## 2015-05-13 DIAGNOSIS — E1140 Type 2 diabetes mellitus with diabetic neuropathy, unspecified: Secondary | ICD-10-CM | POA: Diagnosis not present

## 2015-05-13 DIAGNOSIS — J342 Deviated nasal septum: Secondary | ICD-10-CM | POA: Diagnosis not present

## 2015-05-13 DIAGNOSIS — R262 Difficulty in walking, not elsewhere classified: Secondary | ICD-10-CM | POA: Diagnosis present

## 2015-05-13 DIAGNOSIS — M549 Dorsalgia, unspecified: Secondary | ICD-10-CM | POA: Diagnosis not present

## 2015-05-13 DIAGNOSIS — C44629 Squamous cell carcinoma of skin of left upper limb, including shoulder: Secondary | ICD-10-CM | POA: Diagnosis present

## 2015-05-13 DIAGNOSIS — Z515 Encounter for palliative care: Secondary | ICD-10-CM | POA: Insufficient documentation

## 2015-05-13 HISTORY — DX: Other chronic pain: G89.29

## 2015-05-13 HISTORY — DX: Repeated falls: R29.6

## 2015-05-13 HISTORY — DX: Vascular parkinsonism: G21.4

## 2015-05-13 HISTORY — DX: Type 2 diabetes mellitus without complications: E11.9

## 2015-05-13 HISTORY — DX: Type 2 diabetes mellitus with diabetic polyneuropathy: E11.42

## 2015-05-13 HISTORY — DX: Pain in left leg: M79.605

## 2015-05-13 HISTORY — DX: Gastro-esophageal reflux disease without esophagitis: K21.9

## 2015-05-13 HISTORY — DX: Low back pain, unspecified: M54.50

## 2015-05-13 HISTORY — DX: Low back pain: M54.5

## 2015-05-13 HISTORY — DX: Squamous cell carcinoma of skin of unspecified upper limb, including shoulder: C44.621

## 2015-05-13 LAB — URINALYSIS, ROUTINE W REFLEX MICROSCOPIC
BILIRUBIN URINE: NEGATIVE
Glucose, UA: 250 mg/dL — AB
Hgb urine dipstick: NEGATIVE
Ketones, ur: NEGATIVE mg/dL
Leukocytes, UA: NEGATIVE
NITRITE: NEGATIVE
PROTEIN: 100 mg/dL — AB
SPECIFIC GRAVITY, URINE: 1.018 (ref 1.005–1.030)
UROBILINOGEN UA: 1 mg/dL (ref 0.0–1.0)
pH: 5 (ref 5.0–8.0)

## 2015-05-13 LAB — BASIC METABOLIC PANEL
Anion gap: 10 (ref 5–15)
BUN: 17 mg/dL (ref 6–20)
CALCIUM: 9.2 mg/dL (ref 8.9–10.3)
CHLORIDE: 102 mmol/L (ref 101–111)
CO2: 25 mmol/L (ref 22–32)
CREATININE: 1.06 mg/dL (ref 0.61–1.24)
GFR calc Af Amer: >60 mL/min (ref >60)
Glucose, Bld: 237 mg/dL — ABNORMAL HIGH (ref 65–99)
Potassium: 4.5 mmol/L (ref 3.5–5.1)
SODIUM: 137 mmol/L (ref 135–145)

## 2015-05-13 LAB — PROTIME-INR
INR: 1.04 (ref 0.00–1.49)
Prothrombin Time: 13.8 seconds (ref 11.6–15.2)

## 2015-05-13 LAB — CBC WITH DIFFERENTIAL/PLATELET
BASOS ABS: 0.1 10*3/uL (ref 0.0–0.1)
BASOS PCT: 1 % (ref 0–1)
EOS ABS: 0.5 10*3/uL (ref 0.0–0.7)
Eosinophils Relative: 6 % — ABNORMAL HIGH (ref 0–5)
HCT: 40.4 % (ref 39.0–52.0)
Hemoglobin: 13.8 g/dL (ref 13.0–17.0)
Lymphocytes Relative: 21 % (ref 12–46)
Lymphs Abs: 1.8 10*3/uL (ref 0.7–4.0)
MCH: 26 pg (ref 26.0–34.0)
MCHC: 34.2 g/dL (ref 30.0–36.0)
MCV: 76.1 fL — ABNORMAL LOW (ref 78.0–100.0)
MONO ABS: 0.7 10*3/uL (ref 0.1–1.0)
MONOS PCT: 8 % (ref 3–12)
NEUTROS ABS: 5.4 10*3/uL (ref 1.7–7.7)
Neutrophils Relative %: 64 % (ref 43–77)
Platelets: 253 10*3/uL (ref 150–400)
RBC: 5.31 MIL/uL (ref 4.22–5.81)
RDW: 18.4 % — AB (ref 11.5–15.5)
WBC: 8.3 10*3/uL (ref 4.0–10.5)

## 2015-05-13 LAB — GLUCOSE, CAPILLARY
Glucose-Capillary: 221 mg/dL — ABNORMAL HIGH (ref 65–99)
Glucose-Capillary: 338 mg/dL — ABNORMAL HIGH (ref 65–99)
Glucose-Capillary: 346 mg/dL — ABNORMAL HIGH (ref 65–99)
Glucose-Capillary: 347 mg/dL — ABNORMAL HIGH (ref 65–99)

## 2015-05-13 LAB — URINE MICROSCOPIC-ADD ON

## 2015-05-13 MED ORDER — LISINOPRIL-HYDROCHLOROTHIAZIDE 20-12.5 MG PO TABS: 1.0000 | ORAL_TABLET | Freq: Two times a day (BID) | ORAL | Status: DC

## 2015-05-13 MED ORDER — DICLOFENAC SODIUM 1 % TD GEL
2.0000 g | Freq: Four times a day (QID) | TRANSDERMAL | Status: DC
  Administered 2015-05-13 – 2015-05-19 (×21): 2 g via TOPICAL
  Filled 2015-05-13: qty 100

## 2015-05-13 MED ORDER — AMLODIPINE BESYLATE 5 MG PO TABS
5.0000 mg | ORAL_TABLET | Freq: Every day | ORAL | Status: DC
  Administered 2015-05-14: 5 mg via ORAL
  Filled 2015-05-13 (×2): qty 1

## 2015-05-13 MED ORDER — HYDROCHLOROTHIAZIDE 12.5 MG PO CAPS
12.5000 mg | ORAL_CAPSULE | Freq: Every day | ORAL | Status: DC
  Administered 2015-05-14 – 2015-05-19 (×6): 12.5 mg via ORAL
  Filled 2015-05-13 (×7): qty 1

## 2015-05-13 MED ORDER — DULOXETINE HCL 60 MG PO CPEP
60.0000 mg | ORAL_CAPSULE | Freq: Every day | ORAL | Status: DC
  Administered 2015-05-14 – 2015-05-19 (×6): 60 mg via ORAL
  Filled 2015-05-13 (×6): qty 1

## 2015-05-13 MED ORDER — DEXAMETHASONE SODIUM PHOSPHATE 4 MG/ML IJ SOLN
8.0000 mg | Freq: Four times a day (QID) | INTRAMUSCULAR | Status: DC
  Administered 2015-05-13 – 2015-05-14 (×2): 8 mg via INTRAVENOUS
  Administered 2015-05-14: 4 mg via INTRAVENOUS
  Filled 2015-05-13 (×7): qty 2

## 2015-05-13 MED ORDER — INSULIN REGULAR HUMAN (CONC) 500 UNIT/ML ~~LOC~~ SOPN
150.0000 [IU] | PEN_INJECTOR | Freq: Every day | SUBCUTANEOUS | Status: DC
  Administered 2015-05-13: 150 [IU] via SUBCUTANEOUS

## 2015-05-13 MED ORDER — ONDANSETRON 4 MG PO TBDP
4.0000 mg | ORAL_TABLET | Freq: Once | ORAL | Status: AC
  Administered 2015-05-13: 4 mg via ORAL
  Filled 2015-05-13: qty 1

## 2015-05-13 MED ORDER — HEPARIN SODIUM (PORCINE) 5000 UNIT/ML IJ SOLN
5000.0000 [IU] | Freq: Three times a day (TID) | INTRAMUSCULAR | Status: DC
  Administered 2015-05-13 – 2015-05-19 (×16): 5000 [IU] via SUBCUTANEOUS
  Filled 2015-05-13 (×21): qty 1

## 2015-05-13 MED ORDER — INSULIN REGULAR HUMAN (CONC) 500 UNIT/ML ~~LOC~~ SOLN: 20.0000 [IU] | Freq: Two times a day (BID) | SUBCUTANEOUS | Status: DC

## 2015-05-13 MED ORDER — ONDANSETRON 4 MG PO TBDP
4.0000 mg | ORAL_TABLET | Freq: Once | ORAL | Status: DC
  Filled 2015-05-13: qty 1

## 2015-05-13 MED ORDER — HYDROMORPHONE HCL 1 MG/ML IJ SOLN
1.0000 mg | INTRAMUSCULAR | Status: DC | PRN
  Administered 2015-05-13 – 2015-05-17 (×14): 1 mg via INTRAVENOUS
  Filled 2015-05-13 (×14): qty 1

## 2015-05-13 MED ORDER — HYDROMORPHONE HCL 1 MG/ML IJ SOLN
1.0000 mg | Freq: Once | INTRAMUSCULAR | Status: AC
  Administered 2015-05-13: 1 mg via INTRAVENOUS
  Filled 2015-05-13: qty 1

## 2015-05-13 MED ORDER — INSULIN REGULAR HUMAN (CONC) 500 UNIT/ML ~~LOC~~ SOPN
20.0000 [IU] | PEN_INJECTOR | Freq: Two times a day (BID) | SUBCUTANEOUS | Status: DC
  Administered 2015-05-14: 20 [IU] via SUBCUTANEOUS

## 2015-05-13 MED ORDER — PANTOPRAZOLE SODIUM 40 MG PO TBEC
40.0000 mg | DELAYED_RELEASE_TABLET | Freq: Two times a day (BID) | ORAL | Status: DC
  Administered 2015-05-13 – 2015-05-19 (×12): 40 mg via ORAL
  Filled 2015-05-13 (×12): qty 1

## 2015-05-13 MED ORDER — PROMETHAZINE HCL 25 MG PO TABS
12.5000 mg | ORAL_TABLET | Freq: Four times a day (QID) | ORAL | Status: DC | PRN
  Administered 2015-05-16: 12.5 mg via ORAL
  Filled 2015-05-13: qty 1

## 2015-05-13 MED ORDER — LISINOPRIL 20 MG PO TABS
20.0000 mg | ORAL_TABLET | Freq: Every day | ORAL | Status: DC
  Administered 2015-05-14 – 2015-05-19 (×6): 20 mg via ORAL
  Filled 2015-05-13 (×6): qty 1

## 2015-05-13 MED ORDER — GABAPENTIN 300 MG PO CAPS
300.0000 mg | ORAL_CAPSULE | Freq: Every day | ORAL | Status: DC
  Administered 2015-05-13 – 2015-05-14 (×2): 300 mg via ORAL
  Filled 2015-05-13 (×3): qty 1

## 2015-05-13 MED ORDER — INSULIN REGULAR HUMAN (CONC) 500 UNIT/ML ~~LOC~~ SOPN: 30.0000 [IU] | PEN_INJECTOR | Freq: Every day | SUBCUTANEOUS | Status: DC

## 2015-05-13 MED ORDER — INSULIN REGULAR HUMAN (CONC) 500 UNIT/ML ~~LOC~~ SOPN: 100.0000 [IU] | PEN_INJECTOR | Freq: Two times a day (BID) | SUBCUTANEOUS | Status: DC

## 2015-05-13 MED ORDER — LORAZEPAM 2 MG/ML IJ SOLN
1.0000 mg | Freq: Once | INTRAMUSCULAR | Status: AC
  Administered 2015-05-13: 1 mg via INTRAVENOUS
  Filled 2015-05-13: qty 1

## 2015-05-13 MED ORDER — CARVEDILOL 25 MG PO TABS
25.0000 mg | ORAL_TABLET | Freq: Two times a day (BID) | ORAL | Status: DC
  Administered 2015-05-13 – 2015-05-19 (×12): 25 mg via ORAL
  Filled 2015-05-13 (×14): qty 1

## 2015-05-13 MED ORDER — POLYETHYLENE GLYCOL 3350 17 G PO PACK
17.0000 g | PACK | Freq: Every day | ORAL | Status: DC
  Administered 2015-05-14 – 2015-05-18 (×5): 17 g via ORAL
  Filled 2015-05-13 (×7): qty 1

## 2015-05-13 MED ORDER — CARBIDOPA-LEVODOPA 25-100 MG PO TABS
2.0000 | ORAL_TABLET | Freq: Three times a day (TID) | ORAL | Status: DC
  Administered 2015-05-13: 2 via ORAL
  Filled 2015-05-13 (×4): qty 2

## 2015-05-13 MED ORDER — SENNA 8.6 MG PO TABS
1.0000 | ORAL_TABLET | Freq: Every day | ORAL | Status: DC
  Administered 2015-05-14 – 2015-05-19 (×6): 8.6 mg via ORAL
  Filled 2015-05-13 (×6): qty 1

## 2015-05-13 MED ORDER — INSULIN ASPART 100 UNIT/ML ~~LOC~~ SOLN
5.0000 [IU] | Freq: Once | SUBCUTANEOUS | Status: AC
  Administered 2015-05-14: 5 [IU] via SUBCUTANEOUS

## 2015-05-13 MED ORDER — SIMVASTATIN 20 MG PO TABS
20.0000 mg | ORAL_TABLET | Freq: Every day | ORAL | Status: DC
  Filled 2015-05-13: qty 1

## 2015-05-13 MED ORDER — LORAZEPAM 2 MG/ML IJ SOLN: 1.0000 mg | Freq: Once | INTRAMUSCULAR | Status: DC

## 2015-05-13 MED ORDER — INSULIN ASPART 100 UNIT/ML ~~LOC~~ SOLN
4.0000 [IU] | Freq: Once | SUBCUTANEOUS | Status: AC
  Administered 2015-05-13: 4 [IU] via SUBCUTANEOUS

## 2015-05-13 NOTE — Progress Notes (Signed)
MEDICATION RELATED CONSULT NOTE - INITIAL   Pharmacy Consult for Cangrelor Indication: New DES with possible need for surgery  Allergies  Allergen Reactions  . Codeine Nausea Only    Derivatives -Nausea.  . Fentanyl     nausea  . Tramadol Other (See Comments)    unknown  . Trazodone And Nefazodone Nausea Only    Patient Measurements:   Adjusted Body Weight:   Vital Signs: Temp: 98 F (36.7 C) (06/24 1824) Temp Source: Oral (06/24 1824) BP: 179/68 mmHg (06/24 1824) Pulse Rate: 79 (06/24 1824) Intake/Output from previous day:   Intake/Output from this shift:    Labs:  Recent Labs  05/13/15 1051  WBC 8.3  HGB 13.8  HCT 40.4  PLT 253  CREATININE 1.06   Estimated Creatinine Clearance: 85.9 mL/min (by C-G formula based on Cr of 1.06).   Microbiology: No results found for this or any previous visit (from the past 720 hour(s)).  Medical History: Past Medical History  Diagnosis Date  . Shingles   . Diabetes mellitus   . Hyperlipidemia   . Broken leg     left...s/p pins  . Lower leg pain     chronic ,left leg  . Acute angina   . Depression   . CAD (coronary artery disease)     s/p multiple PCI with stent RCA,LAD and obtuse marginal,followed @ [facility] on the accord study.., cath 02/2012 90% D1, s/p drug-eluting stent Dr. Fletcher [name]  . Falls 09/2011  . Dizziness   . Shortness of breath   . Headache(784.0)   . Hypertension     dr Jerilynn Mages Audelia Acton     [facility] in [place]  . Stroke   . Bell's palsy   . Parkinson disease   . Neuropathy   . Low back pain   . Left leg pain     Medications:  Scheduled:  . [START ON 05/14/2015] amLODipine  5 mg Oral Daily  . carbidopa-levodopa  2 tablet Oral TID  . carvedilol  25 mg Oral BID  . dexamethasone  8 mg Intravenous 4 times per day  . diclofenac sodium  2 g Topical QID  . [START ON 05/14/2015] DULoxetine  60 mg Oral Daily  . gabapentin  300 mg Oral QHS  . heparin  5,000 Units Subcutaneous 3 times per day  . [START ON  05/14/2015] lisinopril  20 mg Oral Daily   And  . [START ON 05/14/2015] hydrochlorothiazide  12.5 mg Oral Daily  . LORazepam  1 mg Intravenous Once  . ondansetron  4 mg Oral Once  . pantoprazole  40 mg Oral BID  . polyethylene glycol  17 g Oral Daily  . [START ON 05/14/2015] senna  1 tablet Oral Daily  . [START ON 05/14/2015] simvastatin  20 mg Oral QHS    Assessment: 69yo male with worsening severe low back pain and fecal incontinence due to cord compression, to be evaluated by Neurosurgery and in possible need for procedure.   Pt also with CAD and is on Plavix at home for new DES.  He will need Cangrelor to bridge, pharmacy to dose when p2Y12 > 208.  His last dose of Plavix was 6/24 AM.  Goal of Therapy:  Bridge antiplatelet agent while off Plavix.  Plan:  F/U p2Y12 in AM  Gracy Bruins, PharmD [facility]

## 2015-05-13 NOTE — ED Notes (Signed)
Attempted to ambulate the patient.  He was unable to lift his feet to ambulate.  Patient returned to bed.

## 2015-05-13 NOTE — Progress Notes (Signed)
Checked pt's HS CBG and it was 347. 30 units of Humolog U-500 (patient home med) supper coverage was already given at 2047. Paged MD Rogue Bussing and he ordered 4 units novoLOG and to recheck CBG again tonight. Explained to the patient and the patient's wife Deuntae Kocsis) about the pt's CBG and the 4 units of coverage ordered by the MD and they consented.  Arshan Jabs, RN

## 2015-05-13 NOTE — Consult Note (Signed)
Reason for Consult:    Referring Physician: Dr.  Verlon Au  PCP:  [name], MD  Primary Cardiologist:Dr. Sergei Delo is an 69 y.o. male.    Chief Complaint: leg, back pain   HPI: 69 year old male with CAD noted below, dm poorly controlled, parkinsons now with worsening severe lower back pain and admitted 05/05/15 for severe pain. He has had total of 3 steroid injections and oral prednisone. Now with continued severe back pain. has had problems walkling.  unable to walk even with walker. Thought was to obtain a myelogram for which patient had to be off his plavix on last admit. We saw and held plavix with plan to begin IV cangrelor once P2Y12 level >208.but no spinal compressions on MRI so mylogram not done- Plavix re- loaded and pt was discharged.    He has known history of coronary artery disease status post multiple PCI in the past at [facility]. He presented in April of 2013 with chest pain. He underwent cardiac catheterization which showed patent stents. However, there was a 90% stenosis in the proximal first diagonal. There was 40% disease in the mid LAD and moderate disease in the left circumflex. He underwent an angioplasty and drug-eluting stent placement to the diagonal without complications. he has known history of refractory hypertension. Renal artery duplex ultrasound showed no evidence of renal artery stenosis. He was seen in March and April for chest pain with minimal activities worrisome for class III angina. Dr. Fletcher [name] proceeded with cardiac catheterization 02/23/15 which showed patent stents in the RCA, left circumflex and first diagonal. There was severe in-stent restenosis in the mid LAD. He performed successful angioplasty and drug-eluting stent placement to the mid LAD. The patient was noted to have diffuse diabetic vessels. Chest pain resolved since then. Last echo 06/18/14 with EF 55-60% moderate concentric LVH.   However, he started  having left-sided back pain and leg numbness due to herniated disc which was noted on MRI. This has caused him significant pain and elevation in blood pressure. He was hospitalized for elevated blood pressure. He had an episode of hypotension that required rapid response but no CPR. He had an epidural steroid injection done recently with partial relief. On last visit with Dr. Fletcher [name] 03/14/15 it was noted "Unfortunately, Plavix cannot be interrupted safely at the present time for back surgery" Now with severe back pain pt could not stand to void today due to pain. The inability to move well and the severe pain is taking a toll on pt psychologically as well.    Readmitted today for continued problems walking and inability to controlling stools.   MRI lumbar spine with cord compression.  We are asked to see to help with plavix.   No chest pain. Saw Dr Fletcher [name] yesterday   Past Medical History  Diagnosis Date  . Shingles   . Diabetes mellitus   . Hyperlipidemia   . Broken leg     left...s/p pins  . Lower leg pain     chronic ,left leg  . Acute angina   . Depression   . CAD (coronary artery disease)     s/p multiple PCI with stent RCA,LAD and obtuse marginal,followed @ [facility] on the accord study.., cath 02/2012 90% D1, s/p drug-eluting stent Dr. Fletcher [name]  . Falls 09/2011  . Dizziness   . Shortness of breath   . Headache(784.0)   . Hypertension     dr Jerilynn Mages Audelia [place]on  [facility]auer in [place]  . Stroke   . Bell's palsy   . Parkinson disease   . Neuropathy   . Low back pain   . Left leg pain     Past Surgical History  Procedure Laterality Date  . Appendectomy    . Cholecystectomy    . Left leg surgery    . Left foot surgery    . Drainage port in left testicle    . Cardiac catheterization  02/2012  . Back surgery    . Anterior cervical decomp/discectomy fusion N/A 04/30/2013    Procedure: ANTERIOR CERVICAL DECOMPRESSION/DISCECTOMY FUSION 2 LEVELS;  Surgeon: Faythe Ghee, MD;  Location: [facility];  Service: Neurosurgery;  Laterality: N/A;  Cervical four-five, Cervical six-seven anterior cervical decompression fusion with trabecular metal cage and plate  . Left heart catheterization with coronary angiogram N/A 02/23/2015    Procedure: LEFT HEART CATHETERIZATION WITH CORONARY ANGIOGRAM;  Surgeon: [name], MD;  Location: [place] CATH LAB;  Service: Cardiovascular;  Laterality: N/A;    Family History  Problem Relation Age of Onset  . Cancer Mother     BREAST  . Heart disease Mother     STENT  . Cancer Father     THROAT  . Heart disease Sister     STENT; HTN  . Heart attack Brother   . Hypertension Brother   . Hypertension Sister    Social History:  reports that he quit smoking about 43 years ago. He has never used smokeless tobacco. He reports that he does not drink alcohol or use illicit drugs.  Allergies:  Allergies  Allergen Reactions  . Codeine Nausea Only    Derivatives -Nausea.  . Fentanyl     nausea  . Tramadol Other (See Comments)    unknown  . Trazodone And Nefazodone Nausea Only    OUTPAIENT MEDICATIONS: No current facility-administered medications on file prior to encounter.   Current Outpatient Prescriptions on File Prior to Encounter  Medication Sig Dispense Refill  . acetaminophen (TYLENOL) 325 MG tablet Take 2 tablets (650 mg total) by mouth every 4 (four) hours as needed for headache or mild pain.    Marland Kitchen amLODipine (NORVASC) 5 MG tablet Take 1 tablet (5 mg total) by mouth daily. 90 tablet 3  . aspirin EC 81 MG tablet Take 81 mg by mouth 3 (three) times daily.    . carbidopa-levodopa (SINEMET IR) 25-100 MG per tablet Take 2 tablets by mouth 3 (three) times daily. (Patient taking differently: Take 1 tablet by mouth 3 (three) times daily. ) 270 tablet 2  . carvedilol (COREG) 25 MG tablet TAKE ONE TABLET BY MOUTH TWICE DAILY 180 tablet 1  . clopidogrel (PLAVIX) 75 MG tablet Take 1 tablet (75 mg total) by mouth daily with breakfast. 90 tablet 1  .  cyanocobalamin (,VITAMIN B-12,) 1000 MCG/ML injection Inject 1,000 mcg into the muscle every 30 (thirty) days.     . diclofenac sodium (VOLTAREN) 1 % GEL Apply 2 g topically 4 (four) times daily. 2 Tube 3  . docusate sodium (COLACE) 100 MG capsule Take 1 capsule (100 mg total) by mouth 2 (two) times daily. (Patient taking differently: Take 100 mg by mouth daily as needed for mild constipation or moderate constipation. ) 60 capsule 0  . DULoxetine (CYMBALTA) 60 MG capsule Take 1 capsule (60 mg total) by mouth daily. TAKE 1 CAPSULE DAILY 30 capsule 1  . gabapentin (NEURONTIN) 300 MG capsule Take 300 mg by mouth at  bedtime.    . insulin regular human CONCENTRATED (HUMULIN R) 500 UNIT/ML injection Inject 20-30 Units into the skin 3 (three) times daily with meals.    Marland Kitchen lisinopril-hydrochlorothiazide (PRINZIDE,ZESTORETIC) 20-12.5 MG per tablet Take 1 tablet by mouth 2 (two) times daily. 180 tablet 1  . metFORMIN (GLUCOPHAGE) 1000 MG tablet TAKE ONE TABLET BY MOUTH TWICE DAILY 180 tablet 1  . nitroGLYCERIN (NITROSTAT) 0.4 MG SL tablet Place 1 tablet (0.4 mg total) under the tongue every 5 (five) minutes as needed for chest pain. 30 tablet 6  . pantoprazole (PROTONIX) 40 MG tablet Take 1 tablet (40 mg total) by mouth 2 (two) times daily. 180 tablet 3  . polyethylene glycol (MIRALAX / GLYCOLAX) packet Take 17 g by mouth daily. 30 each 0  . pregabalin (LYRICA) 150 MG capsule Take 1 capsule (150 mg total) by mouth every morning. 7 capsule 0  . promethazine (PHENERGAN) 12.5 MG tablet Take 1 tablet (12.5 mg total) by mouth every 6 (six) hours as needed for nausea or vomiting. 30 tablet 0  . senna (SENOKOT) 8.6 MG TABS tablet Take 1 tablet (8.6 mg total) by mouth daily. 120 each 0  . simvastatin (ZOCOR) 20 MG tablet Take 1 tablet (20 mg total) by mouth at bedtime. 90 tablet 3     Results for orders placed or performed during the hospital encounter of 05/13/15 (from the past 48 hour(s))  CBC with Differential      Status: Abnormal   Collection Time: 05/13/15 10:51 AM  Result Value Ref Range   WBC 8.3 4.0 - 10.5 K/uL   RBC 5.31 4.22 - 5.81 MIL/uL   Hemoglobin 13.8 13.0 - 17.0 g/dL   HCT 40.4 39.0 - 52.0 %   MCV 76.1 (L) 78.0 - 100.0 fL   MCH 26.0 26.0 - 34.0 pg   MCHC 34.2 30.0 - 36.0 g/dL   RDW 18.4 (H) 11.5 - 15.5 %   Platelets 253 150 - 400 K/uL   Neutrophils Relative % 64 43 - 77 %   Neutro Abs 5.4 1.7 - 7.7 K/uL   Lymphocytes Relative 21 12 - 46 %   Lymphs Abs 1.8 0.7 - 4.0 K/uL   Monocytes Relative 8 3 - 12 %   Monocytes Absolute 0.7 0.1 - 1.0 K/uL   Eosinophils Relative 6 (H) 0 - 5 %   Eosinophils Absolute 0.5 0.0 - 0.7 K/uL   Basophils Relative 1 0 - 1 %   Basophils Absolute 0.1 0.0 - 0.1 K/uL  Basic metabolic panel     Status: Abnormal   Collection Time: 05/13/15 10:51 AM  Result Value Ref Range   Sodium 137 135 - 145 mmol/L   Potassium 4.5 3.5 - 5.1 mmol/L   Chloride 102 101 - 111 mmol/L   CO2 25 22 - 32 mmol/L   Glucose, Bld 237 (H) 65 - 99 mg/dL   BUN 17 6 - 20 mg/dL   Creatinine, Ser 1.06 0.61 - 1.24 mg/dL   Calcium 9.2 8.9 - 10.3 mg/dL   GFR calc non Af Amer >60 >60 mL/min   GFR calc Af Amer >60 >60 mL/min    Comment: (NOTE) The eGFR has been calculated using the CKD EPI equation. This calculation has not been validated in all clinical situations. eGFR's persistently <60 mL/min signify possible Chronic Kidney Disease.    Anion gap 10 5 - 15  Urinalysis, Routine w reflex microscopic (not at [facility])     Status: Abnormal   Collection  Time: 05/13/15 11:44 AM  Result Value Ref Range   Color, Urine YELLOW YELLOW   APPearance CLEAR CLEAR   Specific Gravity, Urine 1.018 1.005 - 1.030   pH 5.0 5.0 - 8.0   Glucose, UA 250 (A) NEGATIVE mg/dL   Hgb urine dipstick NEGATIVE NEGATIVE   Bilirubin Urine NEGATIVE NEGATIVE   Ketones, ur NEGATIVE NEGATIVE mg/dL   Protein, ur 100 (A) NEGATIVE mg/dL   Urobilinogen, UA 1.0 0.0 - 1.0 mg/dL   Nitrite NEGATIVE NEGATIVE   Leukocytes,  UA NEGATIVE NEGATIVE  Urine microscopic-add on     Status: Abnormal   Collection Time: 05/13/15 11:44 AM  Result Value Ref Range   WBC, UA 0-2 <3 WBC/hpf   RBC / HPF 0-2 <3 RBC/hpf   Casts HYALINE CASTS (A) NEGATIVE   Ct Head Wo Contrast  05/13/2015   CLINICAL DATA:  Left-sided numbness and tingling for 2 months.  EXAM: CT HEAD WITHOUT CONTRAST  TECHNIQUE: Contiguous axial images were obtained from the base of the skull through the vertex without intravenous contrast.  COMPARISON:  March 04, 2015  FINDINGS: Moderate diffuse atrophy is stable. There is no intracranial mass, hemorrhage, extra-axial fluid collection, or midline shift. There is small vessel disease in the centra semiovale bilaterally, moderate and stable. No new gray-white compartment lesion is identified. No acute infarct apparent. Bony calvarium appears intact. The mastoid air cells are clear. There is rightward deviation of the nasal septum, stable.  IMPRESSION: Stable atrophy with periventricular small vessel disease. No intracranial mass, hemorrhage, or acute appearing infarct. Stable nasal septal deviation.   Electronically Signed   By: Lowella Grip III M.D.   On: 05/13/2015 11:16   Mr Lumbar Spine Wo Contrast  05/13/2015   CLINICAL DATA:  Low back pain. Unable to walk due to pain. Pain extends down the left leg. Onset of symptoms 02/23/2015. Incontinence.  EXAM: MRI LUMBAR SPINE WITHOUT CONTRAST  TECHNIQUE: Multiplanar, multisequence MR imaging of the lumbar spine was performed. No intravenous contrast was administered.  COMPARISON:  05/05/2015  FINDINGS: Vertebral alignment is unchanged and within normal limits. Chronic T12 compression fracture is again noted status post vertebral augmentation. Lumbar vertebral body heights are preserved. Mild-to-moderate disc desiccation is present throughout the lumbar spine. No vertebral marrow edema is seen. Conus medullaris is normal in signal and terminates at the inferior aspect of L1.  Paraspinal soft tissues are unremarkable aside from edema in the subcutaneous tissues of the lower back.  L1-2 and L2-3:  Negative.  L3-4: Minimal disc bulging and facet hypertrophy without stenosis, unchanged.  L4-5: Mild disc bulging asymmetric to the left and mild facet hypertrophy result in mild left lateral recess narrowing with slight posterior deflection of the left L5 nerve root, unchanged. No spinal canal or neural foraminal stenosis. Tiny left subarticular annular fissure is again seen.  L5-S1: Shallow left foraminal disc protrusion and mild facet hypertrophy without stenosis, unchanged.  IMPRESSION: Mild lumbar disc and facet degeneration without interval change. Mild left lateral recess stenosis at L4-5 could irritate the left L5 nerve root.   Electronically Signed   By: Logan Bores   On: 05/13/2015 14:50    ROS: General:no colds or fevers, no weight changes Skin:no rashes or ulcers HEENT:no blurred vision, no congestion CV:see HPI PUL:see HPI- mild SOB due to difficulty walking GI:no diarrhea constipation or melena, no indigestion, incontinent of stool- last day or so GU:no hematuria, no dysuria MS:no joint pain, no claudication Neuro:no syncope, occ lightheadedness may be due  to pain meds Endo:+ diabetes poorly controlled at times, no thyroid disease   Blood pressure 150/72, pulse 71, temperature 97.7 F (36.5 C), temperature source Oral, resp. rate 18, SpO2 96 %.  Wt Readings from Last 3 Encounters:  05/12/15 260 lb (117.935 kg)  05/12/15 260 lb (117.935 kg)  05/09/15 261 lb 14.4 oz (118.797 kg)    PE: General:Pleasant affect, NAD Skin:Warm and dry, brisk capillary refill HEENT:normocephalic, sclera clear, mucus membranes moist Neck:supple, no JVD, no bruits  Heart:S1S2 RRR without murmur, gallup, rub or click Lungs:clear, ant.  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 CAD with recent coronary stent occlusion s/p DES restenting of LAD 4/16 on Plavix/asa needs dual antiplatelet therapy for one year. On BB statin, on previous hospitalization for this pain - option would be stop Plavix and check p2Y12 for platelet inhibition and once level >208 begin cangrelor IV - then stopcangrelor before procedure - post procedure depending on result reload on plavix 300 mg and resume 75 mg daily or resume cangrelor if need for surgery.   He took the plavix this AM.  EKG yesterday without changes.    [place]  Nurse Practitioner Certified [place] Pager [phone] or after 5pm or weekends call [phone] 05/13/2015, 5:42 PM    Patient seen and examined with Cecilie Kicks, NP. We discussed all aspects of the encounter. I agree with the assessment and plan as stated above. Stable from cardiac standpoint now with progressive neurogenic symptoms concerning for cord compression. Agree with plan laid out for transition off Plavix. If needs surgery sooner then he would have to go understanding he is at higher risk for bleeding and can transfuse platelets to try and minimize risk. Pharmacy aware of plan.   Blaire Hodsdon,MD 6:33 PM

## 2015-05-13 NOTE — ED Notes (Signed)
Wife stated, H's been having left leg, back pain and weakness on the left leg since  April 6

## 2015-05-13 NOTE — H&P (Addendum)
[facility] History and Physical  Steve Phillips SAY:[id number] DOB: 03/03/1946 DOA: 05/13/2015  Referring physician: Posey Pronto, PA PCP: [name], MD  Specialists: Cardiology, NS  Chief Complaint: Acute LBP  HPI:  69 y/o ? CAD 02/2012--he is s/p stent LAD + DES fro in-stent re-stenosis 02/2015, DM ty 2, Parkinson's-s/p multiple 3 epidural spinal injections The patient is followed as well by neurology at [facility] and saw Dr. Manuella Ghazi there from a vascular dementia standpoint and his Lyrica and other medications were downward adjusted and changed at that time He also has an elevated PSA that is being closely watched by urology as well He has had acdf c4-->7 [phone] by Dr. Vertell Limber He presented initially to [facility] and had an extensive evalluation on the admission 6/16-->05/09/15 with [facility] seeing him as well as Dr. Arnoldo Morale of [facility] seeing him as well and no intervention was felt needed as there weas no neural cord compression findigns nor acute active pathology  He was unable to be discharged to skilled nursing facility as he only met observation criteria and and actually saw his cardiologist Dr. Fletcher [name] yesterday 6/23-he was having difficulty ambulating and it appears that for the past week or greater as inability to control his stools His wife says that yesterday he did not really notice a sensation of needing to pass stools and was just passing them without knowledge of passing them Patient has been wearing depends underwear for the past week in an effort to forestall any accidents He has had some episodes of nausea and vomiting which seemed to happen with opiate medications He has been trying to get around with the help of a walker however his wife is finding it increasingly difficult to help him ambulate and move around. He states that he is unable to bear weight on the left lower extremity He does carry a history of significant peripheral neuropathy as well - Chest pain, - fever,  and then chills, - blurred vision, - double vision, - unilateral weakness, - melanoma, - hematemesis    Past Medical History  Diagnosis Date  . Shingles   . Diabetes mellitus   . Hyperlipidemia   . Broken leg     left...s/p pins  . Lower leg pain     chronic ,left leg  . Acute angina   . Depression   . CAD (coronary artery disease)     s/p multiple PCI with stent RCA,LAD and obtuse marginal,followed @ [facility] on the accord study.., cath 02/2012 90% D1, s/p drug-eluting stent Dr. Fletcher [name]  . Falls 09/2011  . Dizziness   . Shortness of breath   . Headache(784.0)   . Hypertension     dr Jerilynn Mages Audelia Acton     [facility] in [place]  . Stroke   . Bell's palsy   . Parkinson disease   . Neuropathy   . Low back pain   . Left leg pain    Past Surgical History  Procedure Laterality Date  . Appendectomy    . Cholecystectomy    . Left leg surgery    . Left foot surgery    . Drainage port in left testicle    . Cardiac catheterization  02/2012  . Back surgery    . Anterior cervical decomp/discectomy fusion N/A 04/30/2013    Procedure: ANTERIOR CERVICAL DECOMPRESSION/DISCECTOMY FUSION 2 LEVELS;  Surgeon: Faythe Ghee, MD;  Location: [facility];  Service: Neurosurgery;  Laterality: N/A;  Cervical four-five, Cervical six-seven anterior cervical decompression fusion with trabecular  metal cage and plate  . Left heart catheterization with coronary angiogram N/A 02/23/2015    Procedure: LEFT HEART CATHETERIZATION WITH CORONARY ANGIOGRAM;  Surgeon: [name], MD;  Location: [facility] CATH LAB;  Service: Cardiovascular;  Laterality: N/A;   Social History:  History   Social History Narrative   Used to work with [employer] until the 1990s and then became disabled and progressively worse   Currently lives with his wife for 2 years   States he wishes to be a full code    Allergies  Allergen Reactions  . Codeine Nausea Only    Derivatives -Nausea.  . Fentanyl     nausea  . Tramadol Other (See  Comments)    unknown  . Trazodone And Nefazodone Nausea Only    Family History  Problem Relation Age of Onset  . Cancer Mother     BREAST  . Heart disease Mother     STENT  . Cancer Father     THROAT  . Heart disease Sister     STENT; HTN  . Heart attack Brother   . Hypertension Brother   . Hypertension Sister     Prior to Admission medications   Medication Sig Start Date End Date Taking? Authorizing Provider  acetaminophen (TYLENOL) 325 MG tablet Take 2 tablets (650 mg total) by mouth every 4 (four) hours as needed for headache or mild pain. 02/24/15  Yes Luke K Kilroy, PA-C  amLODipine (NORVASC) 5 MG tablet Take 1 tablet (5 mg total) by mouth daily. 02/16/15  Yes Jackolyn Confer, MD  aspirin EC 81 MG tablet Take 81 mg by mouth 3 (three) times daily.   Yes Historical Provider, MD  carbidopa-levodopa (SINEMET IR) 25-100 MG per tablet Take 2 tablets by mouth 3 (three) times daily. Patient taking differently: Take 1 tablet by mouth 3 (three) times daily.  02/16/15  Yes Jackolyn Confer, MD  carvedilol (COREG) 25 MG tablet TAKE ONE TABLET BY MOUTH TWICE DAILY 03/31/15  Yes Jackolyn Confer, MD  clopidogrel (PLAVIX) 75 MG tablet Take 1 tablet (75 mg total) by mouth daily with breakfast. 02/16/15  Yes Jackolyn Confer, MD  cyanocobalamin (,VITAMIN B-12,) 1000 MCG/ML injection Inject 1,000 mcg into the muscle every 30 (thirty) days.    Yes Historical Provider, MD  diclofenac sodium (VOLTAREN) 1 % GEL Apply 2 g topically 4 (four) times daily. 05/09/15  Yes Ripudeep Krystal Eaton, MD  docusate sodium (COLACE) 100 MG capsule Take 1 capsule (100 mg total) by mouth 2 (two) times daily. Patient taking differently: Take 100 mg by mouth daily as needed for mild constipation or moderate constipation.  05/09/15  Yes Ripudeep Krystal Eaton, MD  DULoxetine (CYMBALTA) 60 MG capsule Take 1 capsule (60 mg total) by mouth daily. TAKE 1 CAPSULE DAILY 05/09/15  Yes Ripudeep Krystal Eaton, MD  gabapentin (NEURONTIN) 300 MG capsule  Take 300 mg by mouth at bedtime.   Yes Historical Provider, MD  HYDROcodone-acetaminophen (NORCO/VICODIN) 5-325 MG per tablet Take 1 tablet by mouth every 6 (six) hours as needed for moderate pain.   Yes Historical Provider, MD  insulin regular human CONCENTRATED (HUMULIN R) 500 UNIT/ML injection Inject 20-30 Units into the skin 3 (three) times daily with meals.   Yes Historical Provider, MD  lisinopril-hydrochlorothiazide (PRINZIDE,ZESTORETIC) 20-12.5 MG per tablet Take 1 tablet by mouth 2 (two) times daily. 10/27/14  Yes Jackolyn Confer, MD  metFORMIN (GLUCOPHAGE) 1000 MG tablet TAKE ONE TABLET BY MOUTH TWICE DAILY  02/26/15  Yes Luke K Kilroy, PA-C  nitroGLYCERIN (NITROSTAT) 0.4 MG SL tablet Place 1 tablet (0.4 mg total) under the tongue every 5 (five) minutes as needed for chest pain. 05/25/13  Yes Jackolyn Confer, MD  pantoprazole (PROTONIX) 40 MG tablet Take 1 tablet (40 mg total) by mouth 2 (two) times daily. 10/28/14  Yes Jackolyn Confer, MD  polyethylene glycol (MIRALAX / GLYCOLAX) packet Take 17 g by mouth daily. 05/09/15  Yes Ripudeep Krystal Eaton, MD  pregabalin (LYRICA) 150 MG capsule Take 1 capsule (150 mg total) by mouth every morning. 05/09/15  Yes Ripudeep Krystal Eaton, MD  promethazine (PHENERGAN) 12.5 MG tablet Take 1 tablet (12.5 mg total) by mouth every 6 (six) hours as needed for nausea or vomiting. 05/09/15  Yes Ripudeep Krystal Eaton, MD  senna (SENOKOT) 8.6 MG TABS tablet Take 1 tablet (8.6 mg total) by mouth daily. 05/09/15  Yes Ripudeep Krystal Eaton, MD  simvastatin (ZOCOR) 20 MG tablet Take 1 tablet (20 mg total) by mouth at bedtime. 03/08/15  Yes [name], MD   Physical Exam: Filed Vitals:   05/13/15 0856  BP: 150/72  Pulse: 71  Temp: 97.7 F (36.5 C)  TempSrc: Oral  Resp: 18  SpO2: 96%    EOMI NCAT obese thick neck Mallampati 4 No JVD no bruit scleral injection bilaterally S1-S2 murmur 2/6 right upper sternal edge regular rate rhythm Chest clinically clear Skin on his back has  multiple separate keratoses  Abdomen is soft nontender nondistended no rebound with right upper quadrant scar Cremasteric reflexes positive, anal wink was not performed Power is 5/5 in the left hip for dorsi and plantar flexion are 5/5 although notably weaker than the right side Sensation to cold touch is bilaterally equal in all lower extremity dermatomes   Labs on Admission:  Basic Metabolic Panel:  Recent Labs Lab 05/08/15 0515 05/09/15 0550 05/13/15 1051  NA 137 133* 137  K 4.2 4.4 4.5  CL 100* 96* 102  CO2 28 27 25   GLUCOSE 339* 415* 237*  BUN 27* 24* 17  CREATININE 1.52* 1.32* 1.06  CALCIUM 9.1 8.4* 9.2   Liver Function Tests: No results for input(s): AST, ALT, ALKPHOS, BILITOT, PROT, ALBUMIN in the last 168 hours. No results for input(s): LIPASE, AMYLASE in the last 168 hours. No results for input(s): AMMONIA in the last 168 hours. CBC:  Recent Labs Lab 05/08/15 0515 05/09/15 0550 05/13/15 1051  WBC 11.6* 8.2 8.3  NEUTROABS  --   --  5.4  HGB 13.8 13.8 13.8  HCT 40.2 40.6 40.4  MCV 75.0* 75.7* 76.1*  PLT 185 190 253   Cardiac Enzymes: No results for input(s): CKTOTAL, CKMB, CKMBINDEX, TROPONINI in the last 168 hours.  BNP (last 3 results) No results for input(s): BNP in the last 8760 hours.  ProBNP (last 3 results) No results for input(s): PROBNP in the last 8760 hours.  CBG:  Recent Labs Lab 05/08/15 1715 05/08/15 2213 05/09/15 0749 05/09/15 1140 05/09/15 1711  GLUCAP 425* 345* 365* 385* 266*    Radiological Exams on Admission: Ct Head Wo Contrast  05/13/2015   CLINICAL DATA:  Left-sided numbness and tingling for 2 months.  EXAM: CT HEAD WITHOUT CONTRAST  TECHNIQUE: Contiguous axial images were obtained from the base of the skull through the vertex without intravenous contrast.  COMPARISON:  March 04, 2015  FINDINGS: Moderate diffuse atrophy is stable. There is no intracranial mass, hemorrhage, extra-axial fluid collection, or midline shift.  There  is small vessel disease in the centra semiovale bilaterally, moderate and stable. No new gray-white compartment lesion is identified. No acute infarct apparent. Bony calvarium appears intact. The mastoid air cells are clear. There is rightward deviation of the nasal septum, stable.  IMPRESSION: Stable atrophy with periventricular small vessel disease. No intracranial mass, hemorrhage, or acute appearing infarct. Stable nasal septal deviation.   Electronically Signed   By: Lowella Grip III M.D.   On: 05/13/2015 11:16   Mr Lumbar Spine Wo Contrast  05/13/2015   CLINICAL DATA:  Low back pain. Unable to walk due to pain. Pain extends down the left leg. Onset of symptoms 02/23/2015. Incontinence.  EXAM: MRI LUMBAR SPINE WITHOUT CONTRAST  TECHNIQUE: Multiplanar, multisequence MR imaging of the lumbar spine was performed. No intravenous contrast was administered.  COMPARISON:  05/05/2015  FINDINGS: Vertebral alignment is unchanged and within normal limits. Chronic T12 compression fracture is again noted status post vertebral augmentation. Lumbar vertebral body heights are preserved. Mild-to-moderate disc desiccation is present throughout the lumbar spine. No vertebral marrow edema is seen. Conus medullaris is normal in signal and terminates at the inferior aspect of L1. Paraspinal soft tissues are unremarkable aside from edema in the subcutaneous tissues of the lower back.  L1-2 and L2-3:  Negative.  L3-4: Minimal disc bulging and facet hypertrophy without stenosis, unchanged.  L4-5: Mild disc bulging asymmetric to the left and mild facet hypertrophy result in mild left lateral recess narrowing with slight posterior deflection of the left L5 nerve root, unchanged. No spinal canal or neural foraminal stenosis. Tiny left subarticular annular fissure is again seen.  L5-S1: Shallow left foraminal disc protrusion and mild facet hypertrophy without stenosis, unchanged.  IMPRESSION: Mild lumbar disc and facet  degeneration without interval change. Mild left lateral recess stenosis at L4-5 could irritate the left L5 nerve root.   Electronically Signed   By: Logan Bores   On: 05/13/2015 14:50    EKG: Independently reviewed. Normal sinus rhythm  Assessment/Plan Principal Problem:   Lumbar cord compression-patient will be started on Decadron 4 mg every 6 hourly and neurosurgery already is aware of the patient. I have consulted cardiology as there is some concern regarding keeping him off of Plavix. It appears that last admission he was kept off of Plavix for 2 days to get provocative CT myelogram and he was then restarted on it as they felt that it was too risky given his recent DES to the re-stent stenosis. Patient might need further advance therapies such as IV cangrelor as per cardiology who will help [place] coordinate with him and may need to continue on aspirin as per their instructions. I had a long discussion with the wife and we will attempt care coordinate with neurosurgery If neurosurgery feels that it is appropriate to discontinue the IV Decardron they may do so however at this point given the fact that he is somewhat incontinent and has no sensation to pass stool, I would continue them  Active Problems:   CAD (coronary artery disease)-continue Coreg 25 twice a day, Plavix on hold, continue aspirin 81 mg as per cardiology when they see him [it appears he was placed on 81 mg 3 times a day?] HTN-continue amlodipine 5 daily, continue lisinopril/HCTZ bid as per home instructions. This may need to be adjusted moving forward    Hyperlipidemia -continue Zocor 20 mg daily at bedtime, one would consider high intensity statin given renal stent stenosis and I would consider Crestor moving forward as a  has better evidence  DM (diabetes mellitus), type 2 with neurological complications-I've discontinued patient's Lyrica based on notes from neurologist at [facility] patient will instead continue   Benign  prostatic hyperplasia with urinary obstruction-he will need follow-up with his urologist as an outpatient   Vascular dementia? 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

## 2015-05-13 NOTE — ED Notes (Addendum)
Patient returns to Ed with C/O low back pain.  Patient states that he is unable to walk due to the pain. States that the pain shoots down his left leg. Onset February 23, 2015. Patient has a fentenyl patch for pain but it is off now due to C/O nausea.

## 2015-05-13 NOTE — ED Provider Notes (Signed)
CSN: [id number]     Arrival date & time 05/13/15  5277 History   None    Chief Complaint  Patient presents with  . Leg Pain  . Back Pain  . Weakness     (Consider location/radiation/quality/duration/timing/severity/associated sxs/prior Treatment) Patient is a 69 y.o. male presenting with leg pain, back pain, and weakness. The history is provided by the patient. No language interpreter was used.  Leg Pain Associated symptoms: back pain   Associated symptoms: no fever   Back Pain Associated symptoms: leg pain and weakness   Associated symptoms: no fever   Weakness Associated symptoms include weakness. Pertinent negatives include no chills or fever.   Steve. Phillips is a 69 year old male with a history of diabetes, hyperlipidemia, CAD with stent placement, hypertension, stroke, Parkinson disease, chronic left lower leg pain presents with 2 months of severe low back pain and left-sided leg pain. He was seen by his PCP and neurosurgery and has been receiving steroid injections into the back for the last 3 months and the most recent one being 2 weeks ago. He was on oral prednisone but has recently stopped that due to his diabetes. He states that he is confined to a chair home and cannot walk with a walker. He was admitted on 05/05/2015 for weakness and inability to ambulate. Several tests were run with no findings of cord impingement. The patient states he saw his neurosurgeon on Wednesday he wanted to do a myelogram but could not coordinate this with cardiology at their appointment yesterday due to the patient being on Plavix. No recent injuries. Patient states he has been incontinent of bowel in the last 2 days. He is currently taking hydrocodone, fentanyl patch, Lyrica, Neurontin, Cymbalta. He denies any history of cancer. He denies any fever, chills, abdominal pain. His wife states that cardiology recommended that the patient be admitted in order to run the test. MRI of lumbar spine dated 05/05/2015  L3-4: Minimal disc bulging and mild facet hypertrophy without stenosis, unchanged. L4-5: Mild disc bulging asymmetric to the left and mild facet hypertrophy result in minimal left lateral recess narrowing, unchanged. No spinal canal or neural foraminal stenosis. L5-S1: Shallow left foraminal disc protrusion and mild facet hypertrophy without stenosis, unchanged. Neurosurgery: Dr. Arnoldo Morale Cardiology: Annia Belt, MD  Past Medical History  Diagnosis Date  . Shingles   . Diabetes mellitus   . Hyperlipidemia   . Broken leg     left...s/p pins  . Lower leg pain     chronic ,left leg  . Acute angina   . Depression   . CAD (coronary artery disease)     s/p multiple PCI with stent RCA,LAD and obtuse marginal,followed @ [facility] on the accord study.., cath 02/2012 90% D1, s/p drug-eluting stent Dr. Fletcher [name]  . Falls 09/2011  . Dizziness   . Shortness of breath   . Headache(784.0)   . Hypertension     dr Jerilynn Mages Audelia Acton     [facility] in [place]  . Stroke   . Bell's palsy   . Parkinson disease   . Neuropathy   . Low back pain   . Left leg pain    Past Surgical History  Procedure Laterality Date  . Appendectomy    . Cholecystectomy    . Left leg surgery    . Left foot surgery    . Drainage port in left testicle    . Cardiac catheterization  02/2012  . Back surgery    . Anterior cervical decomp/discectomy fusion  N/A 04/30/2013    Procedure: ANTERIOR CERVICAL DECOMPRESSION/DISCECTOMY FUSION 2 LEVELS;  Surgeon: Faythe Ghee, MD;  Location: [facility];  Service: Neurosurgery;  Laterality: N/A;  Cervical four-five, Cervical six-seven anterior cervical decompression fusion with trabecular metal cage and plate  . Left heart catheterization with coronary angiogram N/A 02/23/2015    Procedure: LEFT HEART CATHETERIZATION WITH CORONARY ANGIOGRAM;  Surgeon: [name], MD;  Location: [facility];  Service: Cardiovascular;  Laterality: N/A;   Family History  Problem Relation Age of Onset  . Cancer  Mother     BREAST  . Heart disease Mother     STENT  . Cancer Father     THROAT  . Heart disease Sister     STENT; HTN  . Heart attack Brother   . Hypertension Brother   . Hypertension Sister    History  Substance Use Topics  . Smoking status: Former Smoker    Quit date: 11/08/1971  . Smokeless tobacco: Never Used  . Alcohol Use: No    Review of Systems  Constitutional: Negative for fever and chills.  Musculoskeletal: Positive for back pain.  Neurological: Positive for weakness.  All other systems reviewed and are negative.     Allergies  Codeine; Fentanyl; Tramadol; and Trazodone and nefazodone  Home Medications   Prior to Admission medications   Medication Sig Start Date End Date Taking? Authorizing Provider  acetaminophen (TYLENOL) 325 MG tablet Take 2 tablets (650 mg total) by mouth every 4 (four) hours as needed for headache or mild pain. 02/24/15  Yes Luke K Kilroy, PA-C  amLODipine (NORVASC) 5 MG tablet Take 1 tablet (5 mg total) by mouth daily. 02/16/15  Yes Jackolyn Confer, MD  aspirin EC 81 MG tablet Take 81 mg by mouth 3 (three) times daily.   Yes Historical Provider, MD  carbidopa-levodopa (SINEMET IR) 25-100 MG per tablet Take 2 tablets by mouth 3 (three) times daily. Patient taking differently: Take 1 tablet by mouth 3 (three) times daily.  02/16/15  Yes Jackolyn Confer, MD  carvedilol (COREG) 25 MG tablet TAKE ONE TABLET BY MOUTH TWICE DAILY 03/31/15  Yes Jackolyn Confer, MD  clopidogrel (PLAVIX) 75 MG tablet Take 1 tablet (75 mg total) by mouth daily with breakfast. 02/16/15  Yes Jackolyn Confer, MD  cyanocobalamin (,VITAMIN B-12,) 1000 MCG/ML injection Inject 1,000 mcg into the muscle every 30 (thirty) days.    Yes Historical Provider, MD  diclofenac sodium (VOLTAREN) 1 % GEL Apply 2 g topically 4 (four) times daily. 05/09/15  Yes Ripudeep Krystal Eaton, MD  docusate sodium (COLACE) 100 MG capsule Take 1 capsule (100 mg total) by mouth 2 (two) times  daily. Patient taking differently: Take 100 mg by mouth daily as needed for mild constipation or moderate constipation.  05/09/15  Yes Ripudeep Krystal Eaton, MD  DULoxetine (CYMBALTA) 60 MG capsule Take 1 capsule (60 mg total) by mouth daily. TAKE 1 CAPSULE DAILY 05/09/15  Yes Ripudeep Krystal Eaton, MD  gabapentin (NEURONTIN) 300 MG capsule Take 300 mg by mouth at bedtime.   Yes Historical Provider, MD  HYDROcodone-acetaminophen (NORCO/VICODIN) 5-325 MG per tablet Take 1 tablet by mouth every 6 (six) hours as needed for moderate pain.   Yes Historical Provider, MD  insulin regular human CONCENTRATED (HUMULIN R) 500 UNIT/ML injection Inject 20-30 Units into the skin 3 (three) times daily with meals.   Yes Historical Provider, MD  lisinopril-hydrochlorothiazide (PRINZIDE,ZESTORETIC) 20-12.5 MG per tablet Take 1 tablet by  mouth 2 (two) times daily. 10/27/14  Yes Jackolyn Confer, MD  metFORMIN (GLUCOPHAGE) 1000 MG tablet TAKE ONE TABLET BY MOUTH TWICE DAILY 02/26/15  Yes Erlene Quan, PA-C  nitroGLYCERIN (NITROSTAT) 0.4 MG SL tablet Place 1 tablet (0.4 mg total) under the tongue every 5 (five) minutes as needed for chest pain. 05/25/13  Yes Jackolyn Confer, MD  pantoprazole (PROTONIX) 40 MG tablet Take 1 tablet (40 mg total) by mouth 2 (two) times daily. 10/28/14  Yes Jackolyn Confer, MD  polyethylene glycol (MIRALAX / GLYCOLAX) packet Take 17 g by mouth daily. 05/09/15  Yes Ripudeep Krystal Eaton, MD  pregabalin (LYRICA) 150 MG capsule Take 1 capsule (150 mg total) by mouth every morning. 05/09/15  Yes Ripudeep Krystal Eaton, MD  promethazine (PHENERGAN) 12.5 MG tablet Take 1 tablet (12.5 mg total) by mouth every 6 (six) hours as needed for nausea or vomiting. 05/09/15  Yes Ripudeep Krystal Eaton, MD  senna (SENOKOT) 8.6 MG TABS tablet Take 1 tablet (8.6 mg total) by mouth daily. 05/09/15  Yes Ripudeep Krystal Eaton, MD  simvastatin (ZOCOR) 20 MG tablet Take 1 tablet (20 mg total) by mouth at bedtime. 03/08/15  Yes [name], MD   BP 150/72 mmHg   Pulse 71  Temp(Src) 97.7 F (36.5 C) (Oral)  Resp 18  SpO2 96% Physical Exam  Constitutional: He is oriented to person, place, and time. He appears well-developed and well-nourished.  HENT:  Head: Normocephalic and atraumatic.  Eyes: Conjunctivae are normal.  Neck: Normal range of motion. Neck supple.  Cardiovascular: Normal rate, regular rhythm and normal heart sounds.   Pulmonary/Chest: Effort normal and breath sounds normal. No respiratory distress. He has no wheezes.  Abdominal: Soft. He exhibits no distension. There is no tenderness.  Genitourinary:  Rectal exam: Chaperone present. Nonthrombosed external hemorrhoid at the 12:00 position. Weak squeeze pressure and normal contraction.  Musculoskeletal:  Unable to straight leg raise with the lower left extremity. Weakness with dorsi and plantar flexion.  No midline cervical, thoracic, lumbar tenderness. Hips and pelvis are stable. No foot drop.  Neurological: He is alert and oriented to person, place, and time.  Reflex Scores:      Patellar reflexes are 0 on the right side and 0 on the left side. Skin: Skin is warm and dry.  Nursing note and vitals reviewed.   ED Course  Procedures (including critical care time) Labs Review Labs Reviewed  CBC WITH DIFFERENTIAL/PLATELET - Abnormal; Notable for the following:    MCV 76.1 (*)    RDW 18.4 (*)    Eosinophils Relative 6 (*)    All other components within normal limits  BASIC METABOLIC PANEL - Abnormal; Notable for the following:    Glucose, Bld 237 (*)    All other components within normal limits  URINALYSIS, ROUTINE W REFLEX MICROSCOPIC (NOT AT [facility]) - Abnormal; Notable for the following:    Glucose, UA 250 (*)    Protein, ur 100 (*)    All other components within normal limits  URINE MICROSCOPIC-ADD ON - Abnormal; Notable for the following:    Casts HYALINE CASTS (*)    All other components within normal limits  PROTIME-INR    Imaging Review Ct Head Wo  Contrast  05/13/2015   CLINICAL DATA:  Left-sided numbness and tingling for 2 months.  EXAM: CT HEAD WITHOUT CONTRAST  TECHNIQUE: Contiguous axial images were obtained from the base of the skull through the vertex without intravenous contrast.  COMPARISON:  March 04, 2015  FINDINGS: Moderate diffuse atrophy is stable. There is no intracranial mass, hemorrhage, extra-axial fluid collection, or midline shift. There is small vessel disease in the centra semiovale bilaterally, moderate and stable. No new gray-white compartment lesion is identified. No acute infarct apparent. Bony calvarium appears intact. The mastoid air cells are clear. There is rightward deviation of the nasal septum, stable.  IMPRESSION: Stable atrophy with periventricular small vessel disease. No intracranial mass, hemorrhage, or acute appearing infarct. Stable nasal septal deviation.   Electronically Signed   By: Lowella Grip III M.D.   On: 05/13/2015 11:16   Steve Lumbar Spine Wo Contrast  05/13/2015   CLINICAL DATA:  Low back pain. Unable to walk due to pain. Pain extends down the left leg. Onset of symptoms 02/23/2015. Incontinence.  EXAM: MRI LUMBAR SPINE WITHOUT CONTRAST  TECHNIQUE: Multiplanar, multisequence Steve imaging of the lumbar spine was performed. No intravenous contrast was administered.  COMPARISON:  05/05/2015  FINDINGS: Vertebral alignment is unchanged and within normal limits. Chronic T12 compression fracture is again noted status post vertebral augmentation. Lumbar vertebral body heights are preserved. Mild-to-moderate disc desiccation is present throughout the lumbar spine. No vertebral marrow edema is seen. Conus medullaris is normal in signal and terminates at the inferior aspect of L1. Paraspinal soft tissues are unremarkable aside from edema in the subcutaneous tissues of the lower back.  L1-2 and L2-3:  Negative.  L3-4: Minimal disc bulging and facet hypertrophy without stenosis, unchanged.  L4-5: Mild disc bulging  asymmetric to the left and mild facet hypertrophy result in mild left lateral recess narrowing with slight posterior deflection of the left L5 nerve root, unchanged. No spinal canal or neural foraminal stenosis. Tiny left subarticular annular fissure is again seen.  L5-S1: Shallow left foraminal disc protrusion and mild facet hypertrophy without stenosis, unchanged.  IMPRESSION: Mild lumbar disc and facet degeneration without interval change. Mild left lateral recess stenosis at L4-5 could irritate the left L5 nerve root.   Electronically Signed   By: Logan Bores   On: 05/13/2015 14:50     EKG Interpretation None      MDM   Final diagnoses:  Incontinence  Weakness of left lower extremity  Left-sided low back pain with left-sided sciatica  Patient complains of left lower extremity weakness, back pain, and bowel incontinence. Weak squeeze pressure on rectal exam. Tried to ambulate patient but was not successful.   MRI lumbar spine from 05/05/15 shows Mild lumbar disc degeneration and facet hypertrophy without interval change. No clear neural impingement. Patient had MRI of the C-spine and T-spine the same day which showed no foraminal narrowing. He also had Steve of the left hip and x-ray of the pelvis that was negative. Per cardiology note on 05/12/15 a myelogram is considered necessary to diagnose the patient's condition and that the Plavix should be stopped 5-7 days before the procedure. Patient had drug-eluting stent placed in April but he feels that the patient would require hospital admission and possible bridging with IV Cangreglor. He explained to the patient that there would be a small risk of stent thrombosis. Cardiology forwarded his recommendations to Dr. Hal Neer. Steve lumbar spine from today is comparable to MRI on 05/05/2015. CT head is negative for mass, hemorrhage, acute infarct. I spoke to Dr. Thurnell Garbe who agrees that the patient needs admission for weakness, inability to ambulate, back  pain, and bowel incontinence. Patient states they tried to have the myelogram done outpatient and they took the patient off  plavix for 2 days and had to put him back on the plavix.  I spoke to Dr. Vertell Limber with neurosurgery who stated that the patient taken off Plavix in order to have a myelogram done next week by Dr. Arnoldo Morale.  He did not recommend steroids. I spoke to Dr. Verlon Au who will admit the patient to medsurg.      Ottie Glazier, PA-C 05/13/15 [place], DO 05/16/15 2215

## 2015-05-14 DIAGNOSIS — I10 Essential (primary) hypertension: Secondary | ICD-10-CM

## 2015-05-14 DIAGNOSIS — E785 Hyperlipidemia, unspecified: Secondary | ICD-10-CM

## 2015-05-14 LAB — GLUCOSE, CAPILLARY
GLUCOSE-CAPILLARY: 343 mg/dL — AB (ref 65–99)
GLUCOSE-CAPILLARY: 300 mg/dL — AB (ref 65–99)
Glucose-Capillary: 310 mg/dL — ABNORMAL HIGH (ref 65–99)
Glucose-Capillary: 479 mg/dL — ABNORMAL HIGH (ref 65–99)
Glucose-Capillary: 547 mg/dL — ABNORMAL HIGH (ref 65–99)
Glucose-Capillary: 476 mg/dL — ABNORMAL HIGH (ref 65–99)
Glucose-Capillary: 485 mg/dL — ABNORMAL HIGH (ref 65–99)

## 2015-05-14 LAB — BASIC METABOLIC PANEL
Anion gap: 11 (ref 5–15)
BUN: 21 mg/dL — ABNORMAL HIGH (ref 6–20)
CO2: 23 mmol/L (ref 22–32)
CREATININE: 1.45 mg/dL — AB (ref 0.61–1.24)
Calcium: 8.9 mg/dL (ref 8.9–10.3)
Chloride: 98 mmol/L — ABNORMAL LOW (ref 101–111)
GFR calc Af Amer: 55 mL/min — ABNORMAL LOW (ref >60)
GFR, EST NON AFRICAN AMERICAN: 47 mL/min — AB (ref >60)
Glucose, Bld: 514 mg/dL — ABNORMAL HIGH (ref 65–99)
Potassium: 4.6 mmol/L (ref 3.5–5.1)
SODIUM: 132 mmol/L — AB (ref 135–145)

## 2015-05-14 LAB — COMPREHENSIVE METABOLIC PANEL
ALBUMIN: 3.4 g/dL — AB (ref 3.5–5.0)
ALT: 6 U/L — AB (ref 17–63)
AST: 21 U/L (ref 15–41)
Alkaline Phosphatase: 55 U/L (ref 38–126)
Anion gap: 9 (ref 5–15)
BILIRUBIN TOTAL: 0.6 mg/dL (ref 0.3–1.2)
BUN: 16 mg/dL (ref 6–20)
CHLORIDE: 100 mmol/L — AB (ref 101–111)
CO2: 25 mmol/L (ref 22–32)
CREATININE: 1.13 mg/dL (ref 0.61–1.24)
Calcium: 9 mg/dL (ref 8.9–10.3)
GFR calc non Af Amer: >60 mL/min (ref >60)
Glucose, Bld: 336 mg/dL — ABNORMAL HIGH (ref 65–99)
POTASSIUM: 4.5 mmol/L (ref 3.5–5.1)
Sodium: 134 mmol/L — ABNORMAL LOW (ref 135–145)
Total Protein: 6.1 g/dL — ABNORMAL LOW (ref 6.5–8.1)

## 2015-05-14 LAB — PROTIME-INR
INR: 1.01 (ref 0.00–1.49)
Prothrombin Time: 13.5 seconds (ref 11.6–15.2)

## 2015-05-14 LAB — CBC
HEMATOCRIT: 41.4 % (ref 39.0–52.0)
Hemoglobin: 14.2 g/dL (ref 13.0–17.0)
MCH: 26.3 pg (ref 26.0–34.0)
MCHC: 34.3 g/dL (ref 30.0–36.0)
MCV: 76.8 fL — ABNORMAL LOW (ref 78.0–100.0)
PLATELETS: 244 10*3/uL (ref 150–400)
RBC: 5.39 MIL/uL (ref 4.22–5.81)
RDW: 18.2 % — ABNORMAL HIGH (ref 11.5–15.5)
WBC: 7.1 10*3/uL (ref 4.0–10.5)

## 2015-05-14 LAB — PLATELET INHIBITION P2Y12: Platelet Function  P2Y12: 157 [PRU] — ABNORMAL LOW (ref 194–418)

## 2015-05-14 MED ORDER — INSULIN ASPART 100 UNIT/ML ~~LOC~~ SOLN
20.0000 [IU] | Freq: Three times a day (TID) | SUBCUTANEOUS | Status: DC
  Administered 2015-05-14: 20 [IU] via SUBCUTANEOUS

## 2015-05-14 MED ORDER — INSULIN ASPART 100 UNIT/ML ~~LOC~~ SOLN
5.0000 [IU] | Freq: Once | SUBCUTANEOUS | Status: AC
  Administered 2015-05-14: 5 [IU] via SUBCUTANEOUS

## 2015-05-14 MED ORDER — INSULIN ASPART 100 UNIT/ML ~~LOC~~ SOLN
30.0000 [IU] | Freq: Three times a day (TID) | SUBCUTANEOUS | Status: DC
  Administered 2015-05-14: 30 [IU] via SUBCUTANEOUS
  Administered 2015-05-15: 8 [IU] via SUBCUTANEOUS
  Administered 2015-05-15: 30 [IU] via SUBCUTANEOUS

## 2015-05-14 MED ORDER — ATORVASTATIN CALCIUM 80 MG PO TABS
80.0000 mg | ORAL_TABLET | Freq: Every day | ORAL | Status: DC
  Administered 2015-05-14 – 2015-05-18 (×5): 80 mg via ORAL
  Filled 2015-05-14 (×6): qty 1

## 2015-05-14 MED ORDER — DEXAMETHASONE 4 MG PO TABS
4.0000 mg | ORAL_TABLET | Freq: Two times a day (BID) | ORAL | Status: DC
  Administered 2015-05-14: 4 mg via ORAL
  Filled 2015-05-14 (×4): qty 1

## 2015-05-14 MED ORDER — CARBIDOPA-LEVODOPA 25-100 MG PO TABS
1.0000 | ORAL_TABLET | Freq: Three times a day (TID) | ORAL | Status: DC
  Administered 2015-05-14 (×3): 1 via ORAL
  Filled 2015-05-14 (×5): qty 1

## 2015-05-14 MED ORDER — INSULIN ASPART 100 UNIT/ML ~~LOC~~ SOLN
15.0000 [IU] | Freq: Once | SUBCUTANEOUS | Status: AC
  Administered 2015-05-14: 15 [IU] via SUBCUTANEOUS

## 2015-05-14 MED ORDER — INSULIN ASPART 100 UNIT/ML ~~LOC~~ SOLN
8.0000 [IU] | Freq: Once | SUBCUTANEOUS | Status: AC
  Administered 2015-05-14: 8 [IU] via SUBCUTANEOUS

## 2015-05-14 MED ORDER — INSULIN REGULAR HUMAN (CONC) 500 UNIT/ML ~~LOC~~ SOPN: 20.0000 [IU] | PEN_INJECTOR | Freq: Three times a day (TID) | SUBCUTANEOUS | Status: DC

## 2015-05-14 MED ORDER — INSULIN GLARGINE 100 UNIT/ML ~~LOC~~ SOLN
10.0000 [IU] | Freq: Every day | SUBCUTANEOUS | Status: DC
  Administered 2015-05-14 – 2015-05-15 (×2): 10 [IU] via SUBCUTANEOUS
  Filled 2015-05-14 (×4): qty 0.1

## 2015-05-14 MED ORDER — ATORVASTATIN CALCIUM 80 MG PO TABS
80.0000 mg | ORAL_TABLET | Freq: Every day | ORAL | Status: DC
  Filled 2015-05-14: qty 1

## 2015-05-14 NOTE — Progress Notes (Signed)
SUBJECTIVE:  Denies chest pain or SOB  OBJECTIVE:   Vitals:   Filed Vitals:   05/13/15 0856 05/13/15 1824 05/13/15 2115 05/14/15 0434  BP: 150/72 179/68 154/72 155/73  Pulse: 71 79 88 78  Temp: 97.7 F (36.5 C) 98 F (36.7 C) 97.9 F (36.6 C) 97.3 F (36.3 C)  TempSrc: Oral Oral Oral Oral  Resp: 18 18 18 18   SpO2: 96% 98% 94% 94%   I&O's:   Intake/Output Summary (Last 24 hours) at 05/14/15 1006 Last data filed at 05/14/15 0900  Gross per 24 hour  Intake      0 ml  Output   1725 ml  Net  -1725 ml   TELEMETRY: Reviewed telemetry pt in NSR:     PHYSICAL EXAM General: Well developed, well nourished, in no acute distress Head: Eyes PERRLA, No xanthomas.   Normal cephalic and atramatic  Lungs:   Clear bilaterally to auscultation and percussion. Heart:   HRRR S1 S2 Pulses are 2+ & equal. Abdomen: Bowel sounds are positive, abdomen soft and non-tender without masses Extremities:   No clubbing, cyanosis or edema.  DP +1 Neuro: Alert and oriented X 3. Psych:  Good affect, responds appropriately   LABS: Basic Metabolic Panel:  Recent Labs  05/13/15 1051 05/14/15 0500  NA 137 134*  K 4.5 4.5  CL 102 100*  CO2 25 25  GLUCOSE 237* 336*  BUN 17 16  CREATININE 1.06 1.13  CALCIUM 9.2 9.0   Liver Function Tests:  Recent Labs  05/14/15 0500  AST 21  ALT 6*  ALKPHOS 55  BILITOT 0.6  PROT 6.1*  ALBUMIN 3.4*   No results for input(s): LIPASE, AMYLASE in the last 72 hours. CBC:  Recent Labs  05/13/15 1051 05/14/15 0500  WBC 8.3 7.1  NEUTROABS 5.4  --   HGB 13.8 14.2  HCT 40.4 41.4  MCV 76.1* 76.8*  PLT 253 244   Cardiac Enzymes: No results for input(s): CKTOTAL, CKMB, CKMBINDEX, TROPONINI in the last 72 hours. BNP: Invalid input(s): POCBNP D-Dimer: No results for input(s): DDIMER in the last 72 hours. Hemoglobin A1C: No results for input(s): HGBA1C in the last 72 hours. Fasting Lipid Panel: No results for input(s): CHOL, HDL, LDLCALC, TRIG,  CHOLHDL, LDLDIRECT in the last 72 hours. Thyroid Function Tests: No results for input(s): TSH, T4TOTAL, T3FREE, THYROIDAB in the last 72 hours.  Invalid input(s): FREET3 Anemia Panel: No results for input(s): VITAMINB12, FOLATE, FERRITIN, TIBC, IRON, RETICCTPCT in the last 72 hours. Coag Panel:   Lab Results  Component Value Date   INR 1.01 05/14/2015   INR 1.04 05/13/2015   INR 0.95 02/23/2015    RADIOLOGY: Dg Pelvis 1-2 Views  05/06/2015   CLINICAL DATA:  Left hip and pelvic pain for 3 weeks without known injury. Initial encounter.  EXAM: PELVIS - 1-2 VIEW  COMPARISON:  None.  FINDINGS: There is no evidence of pelvic fracture or diastasis. No pelvic bone lesions are seen. Hip and sacroiliac joints appear normal.  IMPRESSION: Normal pelvis.   Electronically Signed   By: Marijo Conception, M.D.   On: 05/06/2015 16:59   Ct Head Wo Contrast  05/13/2015   CLINICAL DATA:  Left-sided numbness and tingling for 2 months.  EXAM: CT HEAD WITHOUT CONTRAST  TECHNIQUE: Contiguous axial images were obtained from the base of the skull through the vertex without intravenous contrast.  COMPARISON:  March 04, 2015  FINDINGS: Moderate diffuse atrophy is stable. There is no intracranial  mass, hemorrhage, extra-axial fluid collection, or midline shift. There is small vessel disease in the centra semiovale bilaterally, moderate and stable. No new gray-white compartment lesion is identified. No acute infarct apparent. Bony calvarium appears intact. The mastoid air cells are clear. There is rightward deviation of the nasal septum, stable.  IMPRESSION: Stable atrophy with periventricular small vessel disease. No intracranial mass, hemorrhage, or acute appearing infarct. Stable nasal septal deviation.   Electronically Signed   By: Lowella Grip III M.D.   On: 05/13/2015 11:16   Mr Cervical Spine Wo Contrast  05/07/2015   CLINICAL DATA:  Initial evaluation for persistent left leg pain, weakness, swelling.  EXAM: MRI  CERVICAL AND LUMBAR SPINE WITHOUT CONTRAST  TECHNIQUE: Multiplanar and multiecho pulse sequences of the cervical spine, to include the craniocervical junction and cervicothoracic junction, and lumbar spine, were obtained without intravenous contrast.  COMPARISON:  None.  FINDINGS: MRI CERVICAL SPINE FINDINGS  Patchy T2 hyperdensity within the partially visualized pons noted, which may be related chronic small vessel ischemic disease. Brainstem and cervical spinal cord are mildly atrophic in appearance. Mild cerebellar atrophy. Craniocervical junction widely patent.  Mild straightening with slight reversal of the normal cervical lordosis with apex at C5. No listhesis. Patient is status post ACDF at C4-5 and C6-7. Hardware appears well positioned. Signal intensity within the vertebral body bone marrow is normal. No focal osseous lesion. No marrow edema.  Signal intensity within the cervical spinal cord is normal. No focal intra medullary lesions.  Paraspinous soft tissues are unremarkable. Normal intravascular flow voids present within the vertebral arteries bilaterally. No prevertebral edema.  C2-3: Broad-based posterior disc bulge mildly effaces the ventral thecal sac with resultant mild canal narrowing. Foramina remain widely patent.  C3-4: Mild diffuse degenerative disc osteophyte with mild bilateral uncovertebral hypertrophy. A superimposed central/right paracentral shallow disc protrusion indents the ventral thecal sac and contacts the cervical spinal cord. There is resultant moderate canal stenosis. Cervical spinal cord is mildly flattened without cord signal changes. There is mild left foraminal narrowing without significant right foraminal stenosis.  C4-5: Status post ACDF. Moderate canal stenosis. Mild left-sided uncovertebral hypertrophy with resultant mild to moderate left foraminal narrowing. No significant right foraminal stenosis.  C5-6: Mild diffuse degenerative disc osteophyte with right-sided facet  arthrosis. Posterior disc osteophyte partially effaces the ventral thecal sac and results in moderate canal stenosis. There is moderate right foraminal narrowing without significant left foraminal stenosis.  C6-7: Status post ACDF. Moderate canal narrowing. Uncovertebral and facet hypertrophy present bilaterally, left greater than right. There is resultant moderate bilateral foraminal narrowing, left greater than right.  C7-T1: Mild right-sided uncovertebral hypertrophy with resultant mild right foraminal narrowing. Left neural foramen remains widely patent. Minimal posterior disc osteophyte without significant canal stenosis.  MRI THORACIC SPINE FINDINGS  Vertebral bodies are normally aligned with preservation of the normal thoracic kyphosis.  Sequelae of prior T12 kyphoplasty present. There is mild chronic anterior height loss at the superior endplate at this level.  Otherwise, vertebral body heights error fairly well-maintained. There are scattered chronic degenerative endplate Schmorl's nodes throughout the mid and lower thoracic spine, most prevalent about the T6-7 intervertebral disc space an the inferior endplate of T8 and T9. Fatty degenerative endplate changes present at the superior endplate of T8. Diffuse disc desiccation present.  Bone marrow signal intensity within normal limits. No focal osseous lesion. No marrow edema.  Signal intensity within the thoracic spinal cord is within normal limits. No focal intra medullary lesion. There is prominent  epidural lipomatosis within the dorsal epidural space extending from T3-4 through T9-10. Thoracic spinal cord is displaced anteriorly at these levels.  Paraspinous soft tissues are within normal limits. Mild atelectatic changes noted within the visualized lungs.  At T4-5, there is a small left paracentral/foraminal disc protrusion partially effacing the left ventral thecal sac and flattening the left hemi cord without cord signal changes (series 15, image 14).  No significant canal or foraminal narrowing.  At T6-7, there is a tiny right paracentral disc protrusion minimally indenting the ventral thecal sac without significant stenosis (series 15, image 20).  At T7-8, a small right paracentral disc protrusion flattens the right ventral thecal sac with mild flattening of the right hemi cord. No cord signal changes or significant stenosis.  At T8-9, tiny left paracentral disc protrusion without significant stenosis (series 16, image 26).  At T9-10, there is prominent bilateral facet arthrosis without significant stenosis. Minimal disc bulge at this level.  At T10-11, mild bilateral facet arthrosis without significant stenosis.  IMPRESSION: MRI CERVICAL SPINE IMPRESSION:  1. Status post ACDF at C4-5 and V7-8 without complication. 2. 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 Mild superimposed degenerative spondylolysis as above. No cord signal changes identified. 3. Small vessel type changes within the partially visualized pons.  MRI THORACIC SPINE IMPRESSION:  1. Mild multilevel degenerative spondylolysis as detailed above without significant canal or foraminal stenosis. 2. Sequelae of prior vertebral augmentation within the T12 vertebral body. 3. Prominent epidural lipomatosis within the dorsal epidural space extending from T3-4 through T9-10.   Electronically Signed   By: Jeannine Boga M.D.   On: 05/07/2015 03:13   Mr Thoracic Spine Wo Contrast  05/07/2015   CLINICAL DATA:  Initial evaluation for persistent left leg pain, weakness, swelling.  EXAM: MRI CERVICAL AND LUMBAR SPINE WITHOUT CONTRAST  TECHNIQUE: Multiplanar and multiecho pulse sequences of the cervical spine, to include the craniocervical junction and cervicothoracic junction, and lumbar spine, were obtained without intravenous contrast.  COMPARISON:  None.  FINDINGS: MRI CERVICAL SPINE FINDINGS  Patchy T2  hyperdensity within the partially visualized pons noted, which may be related chronic small vessel ischemic disease. Brainstem and cervical spinal cord are mildly atrophic in appearance. Mild cerebellar atrophy. Craniocervical junction widely patent.  Mild straightening with slight reversal of the normal cervical lordosis with apex at C5. No listhesis. Patient is status post ACDF at C4-5 and C6-7. Hardware appears well positioned. Signal intensity within the vertebral body bone marrow is normal. No focal osseous lesion. No marrow edema.  Signal intensity within the cervical spinal cord is normal. No focal intra medullary lesions.  Paraspinous soft tissues are unremarkable. Normal intravascular flow voids present within the vertebral arteries bilaterally. No prevertebral edema.  C2-3: Broad-based posterior disc bulge mildly effaces the ventral thecal sac with resultant mild canal narrowing. Foramina remain widely patent.  C3-4: Mild diffuse degenerative disc osteophyte with mild bilateral uncovertebral hypertrophy. A superimposed central/right paracentral shallow disc protrusion indents the ventral thecal sac and contacts the cervical spinal cord. There is resultant moderate canal stenosis. Cervical spinal cord is mildly flattened without cord signal changes. There is mild left foraminal narrowing without significant right foraminal stenosis.  C4-5: Status post ACDF. Moderate canal stenosis. Mild left-sided uncovertebral hypertrophy with resultant mild to moderate left foraminal narrowing. No significant right foraminal stenosis.  C5-6: Mild diffuse degenerative disc osteophyte with right-sided facet arthrosis. Posterior disc osteophyte partially effaces the  ventral thecal sac and results in moderate canal stenosis. There is moderate right foraminal narrowing without significant left foraminal stenosis.  C6-7: Status post ACDF. Moderate canal narrowing. Uncovertebral and facet hypertrophy present bilaterally, left  greater than right. There is resultant moderate bilateral foraminal narrowing, left greater than right.  C7-T1: Mild right-sided uncovertebral hypertrophy with resultant mild right foraminal narrowing. Left neural foramen remains widely patent. Minimal posterior disc osteophyte without significant canal stenosis.  MRI THORACIC SPINE FINDINGS  Vertebral bodies are normally aligned with preservation of the normal thoracic kyphosis.  Sequelae of prior T12 kyphoplasty present. There is mild chronic anterior height loss at the superior endplate at this level.  Otherwise, vertebral body heights error fairly well-maintained. There are scattered chronic degenerative endplate Schmorl's nodes throughout the mid and lower thoracic spine, most prevalent about the T6-7 intervertebral disc space an the inferior endplate of T8 and T9. Fatty degenerative endplate changes present at the superior endplate of T8. Diffuse disc desiccation present.  Bone marrow signal intensity within normal limits. No focal osseous lesion. No marrow edema.  Signal intensity within the thoracic spinal cord is within normal limits. No focal intra medullary lesion. There is prominent epidural lipomatosis within the dorsal epidural space extending from T3-4 through T9-10. Thoracic spinal cord is displaced anteriorly at these levels.  Paraspinous soft tissues are within normal limits. Mild atelectatic changes noted within the visualized lungs.  At T4-5, there is a small left paracentral/foraminal disc protrusion partially effacing the left ventral thecal sac and flattening the left hemi cord without cord signal changes (series 15, image 14). No significant canal or foraminal narrowing.  At T6-7, there is a tiny right paracentral disc protrusion minimally indenting the ventral thecal sac without significant stenosis (series 15, image 20).  At T7-8, a small right paracentral disc protrusion flattens the right ventral thecal sac with mild flattening of the  right hemi cord. No cord signal changes or significant stenosis.  At T8-9, tiny left paracentral disc protrusion without significant stenosis (series 16, image 26).  At T9-10, there is prominent bilateral facet arthrosis without significant stenosis. Minimal disc bulge at this level.  At T10-11, mild bilateral facet arthrosis without significant stenosis.  IMPRESSION: MRI CERVICAL SPINE IMPRESSION:  1. Status post ACDF at C4-5 and H0-8 without complication. 2. 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 Mild superimposed degenerative spondylolysis as above. No cord signal changes identified. 3. Small vessel type changes within the partially visualized pons.  MRI THORACIC SPINE IMPRESSION:  1. Mild multilevel degenerative spondylolysis as detailed above without significant canal or foraminal stenosis. 2. Sequelae of prior vertebral augmentation within the T12 vertebral body. 3. Prominent epidural lipomatosis within the dorsal epidural space extending from T3-4 through T9-10.   Electronically Signed   By: Jeannine Boga M.D.   On: 05/07/2015 03:13   Mr Lumbar Spine Wo Contrast  05/13/2015   CLINICAL DATA:  Low back pain. Unable to walk due to pain. Pain extends down the left leg. Onset of symptoms 02/23/2015. Incontinence.  EXAM: MRI LUMBAR SPINE WITHOUT CONTRAST  TECHNIQUE: Multiplanar, multisequence MR imaging of the lumbar spine was performed. No intravenous contrast was administered.  COMPARISON:  05/05/2015  FINDINGS: Vertebral alignment is unchanged and within normal limits. Chronic T12 compression fracture is again noted status post vertebral augmentation. Lumbar vertebral body heights are preserved. Mild-to-moderate disc desiccation is present throughout the lumbar spine. No vertebral marrow edema is  seen. Conus medullaris is normal in signal and terminates at the inferior aspect of L1. Paraspinal soft tissues  are unremarkable aside from edema in the subcutaneous tissues of the lower back.  L1-2 and L2-3:  Negative.  L3-4: Minimal disc bulging and facet hypertrophy without stenosis, unchanged.  L4-5: Mild disc bulging asymmetric to the left and mild facet hypertrophy result in mild left lateral recess narrowing with slight posterior deflection of the left L5 nerve root, unchanged. No spinal canal or neural foraminal stenosis. Tiny left subarticular annular fissure is again seen.  L5-S1: Shallow left foraminal disc protrusion and mild facet hypertrophy without stenosis, unchanged.  IMPRESSION: Mild lumbar disc and facet degeneration without interval change. Mild left lateral recess stenosis at L4-5 could irritate the left L5 nerve root.   Electronically Signed   By: Logan Bores   On: 05/13/2015 14:50   Mr Lumbar Spine Wo Contrast  05/05/2015   CLINICAL DATA:  Worsening left lower extremity weakness.  EXAM: MRI LUMBAR SPINE WITHOUT CONTRAST  TECHNIQUE: Multiplanar, multisequence MR imaging of the lumbar spine was performed. No intravenous contrast was administered.  COMPARISON:  03/01/2015  FINDINGS: Vertebral alignment is within normal limits. Prior vertebral augmentation is again noted at T12. Lumbar vertebral body heights are preserved. No vertebral marrow edema is seen. Vertebral body heights are relatively well preserved. Conus medullaris is normal in signal and terminates at the inferior aspect of L1. Tiny right renal cyst is again noted.  L1-2 and L2-3:  Negative.  L3-4: Minimal disc bulging and mild facet hypertrophy without stenosis, unchanged.  L4-5: Mild disc bulging asymmetric to the left and mild facet hypertrophy result in minimal left lateral recess narrowing, unchanged. No spinal canal or neural foraminal stenosis.  L5-S1: Shallow left foraminal disc protrusion and mild facet hypertrophy without stenosis, unchanged.  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  No clear neural impingement.   Electronically Signed   By: Logan Bores   On: 05/05/2015 19:13   Mr Hip Left Wo Contrast  05/07/2015   CLINICAL DATA:  Low back pain for 2-3 months which has worsened over the past week. Left leg weakness. No known injury. Initial encounter.  EXAM: MR OF THE LEFT HIP WITHOUT CONTRAST  TECHNIQUE: Multiplanar, multisequence MR imaging was performed. No intravenous contrast was administered.  COMPARISON:  CT abdomen and pelvis 03/04/2015.  FINDINGS: Bones: Marrow signal is normal in the femoral heads bilaterally without fracture or avascular necrosis. The pelvis and visualized femurs also demonstrate normal signal.  Articular cartilage and labrum  Articular cartilage:  Unremarkable.  Labrum:  Intact.  Joint or bursal effusion  Joint effusion:  None.  Bursae:  Unremarkable.  Muscles and tendons  Muscles and tendons:  Intact and unremarkable in appearance.  Other findings  Miscellaneous: The prostate gland is enlarged. Fat containing inguinal hernias are seen, larger on the left.  IMPRESSION: Normal-appearing pelvis and hips. No finding to explain the patient's symptoms.  Prostatomegaly.   Electronically Signed   By: Inge Rise M.D.   On: 05/07/2015 13:49   Assessment/Plan Principal Problem:  Lumbar cord compression - on Decadron.   Neurosurgery to see on Monday. 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 CAD with recent coronary stent occlusion s/p DES restenting of LAD 4/16 on Plavix/asa needs dual antiplatelet therapy for one year.  Plavix has been stopped and will monitor p2Y12 for platelet inhibition daily (157 today) and once level >208  begin cangrelor IV - then stop cangrelor before procedure - post procedure depending on result reload on plavix 300 mg and resume 75 mg daily or resume cangrelor if need for surgery. Continue BB/statin 2.  HTN - continue ACE I/diuretic/amlodipine and BB.  May need to uptitrate ACE I or amlodipine for better BP control. Per [facility].  3.  Dyslipidemia - continue statin     Sueanne Margarita, MD  05/14/2015  10:06 AM

## 2015-05-14 NOTE — Progress Notes (Addendum)
MD paged to clarify CBG orders. Told not to check q2 hours and to follow ACHS as ordered. CBG 485 Orders given to administer 8 U of novolog and to recheck.   Raliegh Ip RN

## 2015-05-14 NOTE — Progress Notes (Signed)
CBG rechecked. CBG 338. Paged and notified MD Rogue Bussing and 5 units of novoLOg was ordered. 5 units given and wiill recheck CBG. Pacey Willadsen, RN

## 2015-05-14 NOTE — Progress Notes (Signed)
CBG rechecked. CBG 343. Paged and notified MD Rogue Bussing and 5 units of novoLOg was ordered. 5 units given and will recheck CBG. Mikia Delaluz, RN

## 2015-05-14 NOTE — Progress Notes (Signed)
Steve Phillips:[id number] DOB: [birth year] DOA: 05/13/2015 PCP: Steve Deter, MD  Brief narrative: 69 y/o ? CAD 02/2012--he is s/p stent LAD + DES fro in-stent re-stenosis 02/2015, DM ty 2, Parkinson's-s/p multiple 3 epidural spinal injections He presented initially to [facility] and had an extensive evalluation on the admission 6/16-->05/09/15 with [facility] seeing him as well as Dr. Arnoldo Phillips of [facility] seeing him as well and no intervention was felt needed as there weas no neural cord compression findigns nor acute active pathology Represented to the emergency room 6/24 with severe lower extremity pain after just seen his cardiologist 6/23 with difficulty ambulating and incontinence of stool  Past medical history-As per Problem list Chart reviewed as below-   Consultants:  Neurosurgery consulted by emergency room  Cardiology  Procedures:  None  Antibiotics:  None   Subjective  Doing somewhat better today. Once to sit in the chair Steve Phillips at the bedside No chest pain Fever No chills Shortness of breath and overall pain seems improved Steve Phillips brings in home instructions for U500 dosing   Objective    Interim History:   Telemetry: Sinus   Objective: Filed Vitals:   05/13/15 0856 05/13/15 1824 05/13/15 2115 05/14/15 0434  BP: 150/72 179/68 154/72 155/73  Pulse: 71 79 88 78  Temp: 97.7 F (36.5 C) 98 F (36.7 C) 97.9 F (36.6 C) 97.3 F (36.3 C)  TempSrc: Oral Oral Oral Oral  Resp: 18 18 18 18   SpO2: 96% 98% 94% 94%    Intake/Output Summary (Last 24 hours) at 05/14/15 0959 Last data filed at 05/14/15 0900  Gross per 24 hour  Intake      0 ml  Output   1725 ml  Net  -1725 ml    Exam:  General: EOMI NCAT pleasant and in less distress Cardiovascular: S1-S2 no murmur rub or gallop Respiratory: Clinically clear no added sound Abdomen: Soft nontender nondistended no rebound or guarding Skin trace lower extremity edema, scab on front of left calf Neuro power  5/5 throughout, objectively power in the left lower extremity seems improved compared to prior  Data Reviewed: Basic Metabolic Panel:  Recent Labs Lab 05/08/15 0515 05/09/15 0550 05/13/15 1051 05/14/15 0500  NA 137 133* 137 134*  K 4.2 4.4 4.5 4.5  CL 100* 96* 102 100*  CO2 28 27 25 25   GLUCOSE 339* 415* 237* 336*  BUN 27* 24* 17 16  CREATININE 1.52* 1.32* 1.06 1.13  CALCIUM 9.1 8.4* 9.2 9.0   Liver Function Tests:  Recent Labs Lab 05/14/15 0500  AST 21  ALT 6*  ALKPHOS 55  BILITOT 0.6  PROT 6.1*  ALBUMIN 3.4*   No results for input(s): LIPASE, AMYLASE in the last 168 hours. No results for input(s): AMMONIA in the last 168 hours. CBC:  Recent Labs Lab 05/08/15 0515 05/09/15 0550 05/13/15 1051 05/14/15 0500  WBC 11.6* 8.2 8.3 7.1  NEUTROABS  --   --  5.4  --   HGB 13.8 13.8 13.8 14.2  HCT 40.2 40.6 40.4 41.4  MCV 75.0* 75.7* 76.1* 76.8*  PLT 185 190 253 244   Cardiac Enzymes: No results for input(s): CKTOTAL, CKMB, CKMBINDEX, TROPONINI in the last 168 hours. BNP: Invalid input(s): POCBNP CBG:  Recent Labs Lab 05/13/15 2339 05/13/15 2341 05/14/15 0206 05/14/15 0431 05/14/15 0606  GLUCAP 346* 338* 343* 300* 310*    No results found for this or any previous visit (from the past 240 hour(s)).   Studies:  All Imaging reviewed and is as per above notation   Scheduled Meds: . amLODipine  5 mg Oral Daily  . carbidopa-levodopa  2 tablet Oral TID  . carvedilol  25 mg Oral BID  . dexamethasone  4 mg Oral Q12H  . diclofenac sodium  2 g Topical QID  . DULoxetine  60 mg Oral Daily  . gabapentin  300 mg Oral QHS  . heparin  5,000 Units Subcutaneous 3 times per day  . lisinopril  20 mg Oral Daily   And  . hydrochlorothiazide  12.5 mg Oral Daily  . insulin aspart  20 Units Subcutaneous TID AC  . LORazepam  1 mg Intravenous Once  . ondansetron  4 mg Oral Once  . pantoprazole  40 mg Oral BID  . polyethylene glycol  17 g Oral Daily  .  senna  1 tablet Oral Daily  . simvastatin  20 mg Oral QHS   Continuous Infusions:    Assessment/Plan:   Principal Problem:   Lumbar cord compression-doing somewhat better today. Wanting to sit in the chair. Transition to Decadron 4 mg every 12 We will await input from neurosurgeon Dr. Arnoldo Phillips Appreciate cardiology input-IV cangrelor to start at their discretion-P2 Y 12 is currently 157 so no current indication at this stage Plavix was last taken 6/24 a.m. Continue medications for neuropathy including gabapentin 300 daily at bedtime, Cymbalta 60 daily  We will uptitrate this in the next 1-2 days We will continue Dilaudid 1 mg every 3 when necessary  Active Problems:   CAD (coronary artery disease)-patient with recent in-stent thrombosis status post DES placement 02/2015- appreciate cardiology guidance regarding Plavix/aspirin usage. Continue Coreg 25 twice a day., Continue lisinopril/HCTZ twice a day, continue amlodipine 5 daily    Hypertension-see above--pressures are not well controlled and we may need to up titrate meds if needed   Hyperlipidemia-I will change Zocor 22 high intensity dosing Lipitoriven recent stent thrombosis   DM (diabetes mellitus), type 2 with neurological complications-he sees Steve Phillips of endocrinology who usually has this patient on U500 20 units 3 times a day--I have updated his medication list accordingly. Patient's blood sugars are uncontrolled because of IV Decadron that has been given in terms of concern for acute cord compression.   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

## 2015-05-14 NOTE — Progress Notes (Signed)
CBG rechecked. CBG 310. MD Rogue Bussing was notified and made aware.  Cashmere Harmes, RN

## 2015-05-14 NOTE — Progress Notes (Signed)
CBG rechecked. CBG 300. Paged and notified MD Rogue Bussing and 5 units of novoLOg was ordered. 5 units given and will recheck CBG. Moody Robben, RN

## 2015-05-15 LAB — GLUCOSE, CAPILLARY
GLUCOSE-CAPILLARY: 391 mg/dL — AB (ref 65–99)
GLUCOSE-CAPILLARY: 430 mg/dL — AB (ref 65–99)
GLUCOSE-CAPILLARY: 449 mg/dL — AB (ref 65–99)
Glucose-Capillary: 378 mg/dL — ABNORMAL HIGH (ref 65–99)
Glucose-Capillary: 399 mg/dL — ABNORMAL HIGH (ref 65–99)

## 2015-05-15 LAB — PLATELET INHIBITION P2Y12: Platelet Function  P2Y12: 186 [PRU] — ABNORMAL LOW (ref 194–418)

## 2015-05-15 MED ORDER — CARBIDOPA-LEVODOPA 25-100 MG PO TABS
1.0000 | ORAL_TABLET | Freq: Two times a day (BID) | ORAL | Status: DC
  Administered 2015-05-15 – 2015-05-19 (×9): 1 via ORAL
  Filled 2015-05-15 (×10): qty 1

## 2015-05-15 MED ORDER — GABAPENTIN 300 MG PO CAPS
300.0000 mg | ORAL_CAPSULE | Freq: Two times a day (BID) | ORAL | Status: DC
  Administered 2015-05-15 – 2015-05-19 (×9): 300 mg via ORAL
  Filled 2015-05-15 (×11): qty 1

## 2015-05-15 MED ORDER — INSULIN ASPART 100 UNIT/ML ~~LOC~~ SOLN
8.0000 [IU] | Freq: Once | SUBCUTANEOUS | Status: AC
  Administered 2015-05-15: 8 [IU] via SUBCUTANEOUS

## 2015-05-15 MED ORDER — AMLODIPINE BESYLATE 10 MG PO TABS
10.0000 mg | ORAL_TABLET | Freq: Every day | ORAL | Status: DC
  Administered 2015-05-15 – 2015-05-19 (×5): 10 mg via ORAL
  Filled 2015-05-15 (×6): qty 1

## 2015-05-15 MED ORDER — OXYCODONE HCL 5 MG PO TABS
5.0000 mg | ORAL_TABLET | ORAL | Status: DC | PRN
  Administered 2015-05-15 – 2015-05-19 (×6): 5 mg via ORAL
  Filled 2015-05-15 (×6): qty 1

## 2015-05-15 MED ORDER — OXYCODONE-ACETAMINOPHEN 5-325 MG PO TABS
1.0000 | ORAL_TABLET | ORAL | Status: DC | PRN
  Administered 2015-05-15 – 2015-05-19 (×5): 1 via ORAL
  Filled 2015-05-15 (×5): qty 1

## 2015-05-15 MED ORDER — INSULIN ASPART 100 UNIT/ML ~~LOC~~ SOLN
38.0000 [IU] | Freq: Three times a day (TID) | SUBCUTANEOUS | Status: DC
  Administered 2015-05-15 – 2015-05-19 (×11): 38 [IU] via SUBCUTANEOUS

## 2015-05-15 NOTE — Progress Notes (Signed)
SUBJECTIVE:  No compliaints  OBJECTIVE:   Vitals:   Filed Vitals:   05/14/15 1347 05/14/15 2117 05/14/15 2309 05/15/15 0604  BP: 182/85 157/79 166/74 159/67  Pulse: 108 88  76  Temp:  97.4 F (36.3 C)  97.6 F (36.4 C)  TempSrc:  Oral  Oral  Resp: 18 16  18   SpO2: 95% 98%  95%   I&O's:   Intake/Output Summary (Last 24 hours) at 05/15/15 6967 Last data filed at 05/15/15 0800  Gross per 24 hour  Intake      0 ml  Output   1075 ml  Net  -1075 ml   TELEMETRY: Reviewed telemetry pt in NSR:     PHYSICAL EXAM General: Well developed, well nourished, in no acute distress Head: Eyes PERRLA, No xanthomas.   Normal cephalic and atramatic  Lungs:   Clear bilaterally to auscultation and percussion. Heart:   HRRR S1 S2 Pulses are 2+ & equal. Abdomen: Bowel sounds are positive, abdomen soft and non-tender without masses  Extremities:   No clubbing, cyanosis or edema.  DP +1 Neuro: Alert and oriented X 3. Psych:  Good affect, responds appropriately   LABS: Basic Metabolic Panel:  Recent Labs  05/14/15 0500 05/14/15 1440  NA 134* 132*  K 4.5 4.6  CL 100* 98*  CO2 25 23  GLUCOSE 336* 514*  BUN 16 21*  CREATININE 1.13 1.45*  CALCIUM 9.0 8.9   Liver Function Tests:  Recent Labs  05/14/15 0500  AST 21  ALT 6*  ALKPHOS 55  BILITOT 0.6  PROT 6.1*  ALBUMIN 3.4*   No results for input(s): LIPASE, AMYLASE in the last 72 hours. CBC:  Recent Labs  05/13/15 1051 05/14/15 0500  WBC 8.3 7.1  NEUTROABS 5.4  --   HGB 13.8 14.2  HCT 40.4 41.4  MCV 76.1* 76.8*  PLT 253 244   Cardiac Enzymes: No results for input(s): CKTOTAL, CKMB, CKMBINDEX, TROPONINI in the last 72 hours. BNP: Invalid input(s): POCBNP D-Dimer: No results for input(s): DDIMER in the last 72 hours. Hemoglobin A1C: No results for input(s): HGBA1C in the last 72 hours. Fasting Lipid Panel: No results for input(s): CHOL, HDL, LDLCALC, TRIG, CHOLHDL, LDLDIRECT in the last 72 hours. Thyroid  Function Tests: No results for input(s): TSH, T4TOTAL, T3FREE, THYROIDAB in the last 72 hours.  Invalid input(s): FREET3 Anemia Panel: No results for input(s): VITAMINB12, FOLATE, FERRITIN, TIBC, IRON, RETICCTPCT in the last 72 hours. Coag Panel:   Lab Results  Component Value Date   INR 1.01 05/14/2015   INR 1.04 05/13/2015   INR 0.95 02/23/2015    RADIOLOGY: Dg Pelvis 1-2 Views  05/06/2015   CLINICAL DATA:  Left hip and pelvic pain for 3 weeks without known injury. Initial encounter.  EXAM: PELVIS - 1-2 VIEW  COMPARISON:  None.  FINDINGS: There is no evidence of pelvic fracture or diastasis. No pelvic bone lesions are seen. Hip and sacroiliac joints appear normal.  IMPRESSION: Normal pelvis.   Electronically Signed   By: Marijo Conception, M.D.   On: 05/06/2015 16:59   Ct Head Wo Contrast  05/13/2015   CLINICAL DATA:  Left-sided numbness and tingling for 2 months.  EXAM: CT HEAD WITHOUT CONTRAST  TECHNIQUE: Contiguous axial images were obtained from the base of the skull through the vertex without intravenous contrast.  COMPARISON:  March 04, 2015  FINDINGS: Moderate diffuse atrophy is stable. There is no intracranial mass, hemorrhage, extra-axial fluid collection, or midline shift.  There is small vessel disease in the centra semiovale bilaterally, moderate and stable. No new gray-white compartment lesion is identified. No acute infarct apparent. Bony calvarium appears intact. The mastoid air cells are clear. There is rightward deviation of the nasal septum, stable.  IMPRESSION: Stable atrophy with periventricular small vessel disease. No intracranial mass, hemorrhage, or acute appearing infarct. Stable nasal septal deviation.   Electronically Signed   By: Lowella Grip III M.D.   On: 05/13/2015 11:16   Mr Cervical Spine Wo Contrast  05/07/2015   CLINICAL DATA:  Initial evaluation for persistent left leg pain, weakness, swelling.  EXAM: MRI CERVICAL AND LUMBAR SPINE WITHOUT CONTRAST   TECHNIQUE: Multiplanar and multiecho pulse sequences of the cervical spine, to include the craniocervical junction and cervicothoracic junction, and lumbar spine, were obtained without intravenous contrast.  COMPARISON:  None.  FINDINGS: MRI CERVICAL SPINE FINDINGS  Patchy T2 hyperdensity within the partially visualized pons noted, which may be related chronic small vessel ischemic disease. Brainstem and cervical spinal cord are mildly atrophic in appearance. Mild cerebellar atrophy. Craniocervical junction widely patent.  Mild straightening with slight reversal of the normal cervical lordosis with apex at C5. No listhesis. Patient is status post ACDF at C4-5 and C6-7. Hardware appears well positioned. Signal intensity within the vertebral body bone marrow is normal. No focal osseous lesion. No marrow edema.  Signal intensity within the cervical spinal cord is normal. No focal intra medullary lesions.  Paraspinous soft tissues are unremarkable. Normal intravascular flow voids present within the vertebral arteries bilaterally. No prevertebral edema.  C2-3: Broad-based posterior disc bulge mildly effaces the ventral thecal sac with resultant mild canal narrowing. Foramina remain widely patent.  C3-4: Mild diffuse degenerative disc osteophyte with mild bilateral uncovertebral hypertrophy. A superimposed central/right paracentral shallow disc protrusion indents the ventral thecal sac and contacts the cervical spinal cord. There is resultant moderate canal stenosis. Cervical spinal cord is mildly flattened without cord signal changes. There is mild left foraminal narrowing without significant right foraminal stenosis.  C4-5: Status post ACDF. Moderate canal stenosis. Mild left-sided uncovertebral hypertrophy with resultant mild to moderate left foraminal narrowing. No significant right foraminal stenosis.  C5-6: Mild diffuse degenerative disc osteophyte with right-sided facet arthrosis. Posterior disc osteophyte  partially effaces the ventral thecal sac and results in moderate canal stenosis. There is moderate right foraminal narrowing without significant left foraminal stenosis.  C6-7: Status post ACDF. Moderate canal narrowing. Uncovertebral and facet hypertrophy present bilaterally, left greater than right. There is resultant moderate bilateral foraminal narrowing, left greater than right.  C7-T1: Mild right-sided uncovertebral hypertrophy with resultant mild right foraminal narrowing. Left neural foramen remains widely patent. Minimal posterior disc osteophyte without significant canal stenosis.  MRI THORACIC SPINE FINDINGS  Vertebral bodies are normally aligned with preservation of the normal thoracic kyphosis.  Sequelae of prior T12 kyphoplasty present. There is mild chronic anterior height loss at the superior endplate at this level.  Otherwise, vertebral body heights error fairly well-maintained. There are scattered chronic degenerative endplate Schmorl's nodes throughout the mid and lower thoracic spine, most prevalent about the T6-7 intervertebral disc space an the inferior endplate of T8 and T9. Fatty degenerative endplate changes present at the superior endplate of T8. Diffuse disc desiccation present.  Bone marrow signal intensity within normal limits. No focal osseous lesion. No marrow edema.  Signal intensity within the thoracic spinal cord is within normal limits. No focal intra medullary lesion. There is prominent epidural lipomatosis within the dorsal epidural space extending  from T3-4 through T9-10. Thoracic spinal cord is displaced anteriorly at these levels.  Paraspinous soft tissues are within normal limits. Mild atelectatic changes noted within the visualized lungs.  At T4-5, there is a small left paracentral/foraminal disc protrusion partially effacing the left ventral thecal sac and flattening the left hemi cord without cord signal changes (series 15, image 14). No significant canal or foraminal  narrowing.  At T6-7, there is a tiny right paracentral disc protrusion minimally indenting the ventral thecal sac without significant stenosis (series 15, image 20).  At T7-8, a small right paracentral disc protrusion flattens the right ventral thecal sac with mild flattening of the right hemi cord. No cord signal changes or significant stenosis.  At T8-9, tiny left paracentral disc protrusion without significant stenosis (series 16, image 26).  At T9-10, there is prominent bilateral facet arthrosis without significant stenosis. Minimal disc bulge at this level.  At T10-11, mild bilateral facet arthrosis without significant stenosis.  IMPRESSION: MRI CERVICAL SPINE IMPRESSION:  1. Status post ACDF at C4-5 and J4-7 without complication. 2. 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 Mild superimposed degenerative spondylolysis as above. No cord signal changes identified. 3. Small vessel type changes within the partially visualized pons.  MRI THORACIC SPINE IMPRESSION:  1. Mild multilevel degenerative spondylolysis as detailed above without significant canal or foraminal stenosis. 2. Sequelae of prior vertebral augmentation within the T12 vertebral body. 3. Prominent epidural lipomatosis within the dorsal epidural space extending from T3-4 through T9-10.   Electronically Signed   By: Jeannine Boga M.D.   On: 05/07/2015 03:13   Mr Thoracic Spine Wo Contrast  05/07/2015   CLINICAL DATA:  Initial evaluation for persistent left leg pain, weakness, swelling.  EXAM: MRI CERVICAL AND LUMBAR SPINE WITHOUT CONTRAST  TECHNIQUE: Multiplanar and multiecho pulse sequences of the cervical spine, to include the craniocervical junction and cervicothoracic junction, and lumbar spine, were obtained without intravenous contrast.  COMPARISON:  None.  FINDINGS: MRI CERVICAL SPINE FINDINGS  Patchy T2 hyperdensity within the partially  visualized pons noted, which may be related chronic small vessel ischemic disease. Brainstem and cervical spinal cord are mildly atrophic in appearance. Mild cerebellar atrophy. Craniocervical junction widely patent.  Mild straightening with slight reversal of the normal cervical lordosis with apex at C5. No listhesis. Patient is status post ACDF at C4-5 and C6-7. Hardware appears well positioned. Signal intensity within the vertebral body bone marrow is normal. No focal osseous lesion. No marrow edema.  Signal intensity within the cervical spinal cord is normal. No focal intra medullary lesions.  Paraspinous soft tissues are unremarkable. Normal intravascular flow voids present within the vertebral arteries bilaterally. No prevertebral edema.  C2-3: Broad-based posterior disc bulge mildly effaces the ventral thecal sac with resultant mild canal narrowing. Foramina remain widely patent.  C3-4: Mild diffuse degenerative disc osteophyte with mild bilateral uncovertebral hypertrophy. A superimposed central/right paracentral shallow disc protrusion indents the ventral thecal sac and contacts the cervical spinal cord. There is resultant moderate canal stenosis. Cervical spinal cord is mildly flattened without cord signal changes. There is mild left foraminal narrowing without significant right foraminal stenosis.  C4-5: Status post ACDF. Moderate canal stenosis. Mild left-sided uncovertebral hypertrophy with resultant mild to moderate left foraminal narrowing. No significant right foraminal stenosis.  C5-6: Mild diffuse degenerative disc osteophyte with right-sided facet arthrosis. Posterior disc osteophyte partially effaces the ventral thecal sac and results in moderate canal  stenosis. There is moderate right foraminal narrowing without significant left foraminal stenosis.  C6-7: Status post ACDF. Moderate canal narrowing. Uncovertebral and facet hypertrophy present bilaterally, left greater than right. There is  resultant moderate bilateral foraminal narrowing, left greater than right.  C7-T1: Mild right-sided uncovertebral hypertrophy with resultant mild right foraminal narrowing. Left neural foramen remains widely patent. Minimal posterior disc osteophyte without significant canal stenosis.  MRI THORACIC SPINE FINDINGS  Vertebral bodies are normally aligned with preservation of the normal thoracic kyphosis.  Sequelae of prior T12 kyphoplasty present. There is mild chronic anterior height loss at the superior endplate at this level.  Otherwise, vertebral body heights error fairly well-maintained. There are scattered chronic degenerative endplate Schmorl's nodes throughout the mid and lower thoracic spine, most prevalent about the T6-7 intervertebral disc space an the inferior endplate of T8 and T9. Fatty degenerative endplate changes present at the superior endplate of T8. Diffuse disc desiccation present.  Bone marrow signal intensity within normal limits. No focal osseous lesion. No marrow edema.  Signal intensity within the thoracic spinal cord is within normal limits. No focal intra medullary lesion. There is prominent epidural lipomatosis within the dorsal epidural space extending from T3-4 through T9-10. Thoracic spinal cord is displaced anteriorly at these levels.  Paraspinous soft tissues are within normal limits. Mild atelectatic changes noted within the visualized lungs.  At T4-5, there is a small left paracentral/foraminal disc protrusion partially effacing the left ventral thecal sac and flattening the left hemi cord without cord signal changes (series 15, image 14). No significant canal or foraminal narrowing.  At T6-7, there is a tiny right paracentral disc protrusion minimally indenting the ventral thecal sac without significant stenosis (series 15, image 20).  At T7-8, a small right paracentral disc protrusion flattens the right ventral thecal sac with mild flattening of the right hemi cord. No cord signal  changes or significant stenosis.  At T8-9, tiny left paracentral disc protrusion without significant stenosis (series 16, image 26).  At T9-10, there is prominent bilateral facet arthrosis without significant stenosis. Minimal disc bulge at this level.  At T10-11, mild bilateral facet arthrosis without significant stenosis.  IMPRESSION: MRI CERVICAL SPINE IMPRESSION:  1. Status post ACDF at C4-5 and N0-2 without complication. 2. 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 Mild superimposed degenerative spondylolysis as above. No cord signal changes identified. 3. Small vessel type changes within the partially visualized pons.  MRI THORACIC SPINE IMPRESSION:  1. Mild multilevel degenerative spondylolysis as detailed above without significant canal or foraminal stenosis. 2. Sequelae of prior vertebral augmentation within the T12 vertebral body. 3. Prominent epidural lipomatosis within the dorsal epidural space extending from T3-4 through T9-10.   Electronically Signed   By: Jeannine Boga M.D.   On: 05/07/2015 03:13   Mr Lumbar Spine Wo Contrast  05/13/2015   CLINICAL DATA:  Low back pain. Unable to walk due to pain. Pain extends down the left leg. Onset of symptoms 02/23/2015. Incontinence.  EXAM: MRI LUMBAR SPINE WITHOUT CONTRAST  TECHNIQUE: Multiplanar, multisequence MR imaging of the lumbar spine was performed. No intravenous contrast was administered.  COMPARISON:  05/05/2015  FINDINGS: Vertebral alignment is unchanged and within normal limits. Chronic T12 compression fracture is again noted status post vertebral augmentation. Lumbar vertebral body heights are preserved. Mild-to-moderate disc desiccation is present throughout the lumbar spine. No vertebral marrow edema is seen. Conus medullaris is normal in signal and  terminates at the inferior aspect of L1. Paraspinal soft tissues are unremarkable aside from  edema in the subcutaneous tissues of the lower back.  L1-2 and L2-3:  Negative.  L3-4: Minimal disc bulging and facet hypertrophy without stenosis, unchanged.  L4-5: Mild disc bulging asymmetric to the left and mild facet hypertrophy result in mild left lateral recess narrowing with slight posterior deflection of the left L5 nerve root, unchanged. No spinal canal or neural foraminal stenosis. Tiny left subarticular annular fissure is again seen.  L5-S1: Shallow left foraminal disc protrusion and mild facet hypertrophy without stenosis, unchanged.  IMPRESSION: Mild lumbar disc and facet degeneration without interval change. Mild left lateral recess stenosis at L4-5 could irritate the left L5 nerve root.   Electronically Signed   By: Logan Bores   On: 05/13/2015 14:50   Mr Lumbar Spine Wo Contrast  05/05/2015   CLINICAL DATA:  Worsening left lower extremity weakness.  EXAM: MRI LUMBAR SPINE WITHOUT CONTRAST  TECHNIQUE: Multiplanar, multisequence MR imaging of the lumbar spine was performed. No intravenous contrast was administered.  COMPARISON:  03/01/2015  FINDINGS: Vertebral alignment is within normal limits. Prior vertebral augmentation is again noted at T12. Lumbar vertebral body heights are preserved. No vertebral marrow edema is seen. Vertebral body heights are relatively well preserved. Conus medullaris is normal in signal and terminates at the inferior aspect of L1. Tiny right renal cyst is again noted.  L1-2 and L2-3:  Negative.  L3-4: Minimal disc bulging and mild facet hypertrophy without stenosis, unchanged.  L4-5: Mild disc bulging asymmetric to the left and mild facet hypertrophy result in minimal left lateral recess narrowing, unchanged. No spinal canal or neural foraminal stenosis.  L5-S1: Shallow left foraminal disc protrusion and mild facet hypertrophy without stenosis, unchanged.  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 No clear neural  impingement.   Electronically Signed   By: Logan Bores   On: 05/05/2015 19:13   Mr Hip Left Wo Contrast  05/07/2015   CLINICAL DATA:  Low back pain for 2-3 months which has worsened over the past week. Left leg weakness. No known injury. Initial encounter.  EXAM: MR OF THE LEFT HIP WITHOUT CONTRAST  TECHNIQUE: Multiplanar, multisequence MR imaging was performed. No intravenous contrast was administered.  COMPARISON:  CT abdomen and pelvis 03/04/2015.  FINDINGS: Bones: Marrow signal is normal in the femoral heads bilaterally without fracture or avascular necrosis. The pelvis and visualized femurs also demonstrate normal signal.  Articular cartilage and labrum  Articular cartilage:  Unremarkable.  Labrum:  Intact.  Joint or bursal effusion  Joint effusion:  None.  Bursae:  Unremarkable.  Muscles and tendons  Muscles and tendons:  Intact and unremarkable in appearance.  Other findings  Miscellaneous: The prostate gland is enlarged. Fat containing inguinal hernias are seen, larger on the left.  IMPRESSION: Normal-appearing pelvis and hips. No finding to explain the patient's symptoms.  Prostatomegaly.   Electronically Signed   By: Inge Rise M.D.   On: 05/07/2015 13:49    Assessment/Plan Principal Problem:  Lumbar cord compression - on Decadron. Neurosurgery to see on Monday. 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 CAD with recent coronary  stent occlusion s/p DES restenting of LAD 4/16 on Plavix/asa needs dual antiplatelet therapy for one year. Plavix has been stopped and will monitor p2Y12 for platelet inhibition daily (186 today) and once level >208 begin cangrelor  IV - then stop cangrelor before procedure - post procedure depending on result reload on plavix 300 mg and resume 75 mg daily or resume cangrelor if need for surgery. Continue BB/statin 2. HTN -  Remains elevated - continue ACE I/diuretic/amlodipine and BB. Increase amlodipine to 10mg  daily for better BP control 3. Dyslipidemia - continue statin   Sueanne Margarita, MD  05/15/2015  9:27 AM

## 2015-05-15 NOTE — Progress Notes (Signed)
MD paged regarding BS [id number] and to clarify whether pt should be receiving U500 insulin pen or novolog 100. Orders given to continue to give novolog 100. Will continue to monitor.  Raliegh Ip RN

## 2015-05-15 NOTE — Progress Notes (Addendum)
MD paged regarding [id number] CBG. Orders given to administer 5 U novolog and recheck. Will continue to monitor.  Raliegh Ip RN

## 2015-05-15 NOTE — Progress Notes (Signed)
[id number] nsg Patient encouraged to ambulate but he said his legs just won't do nothing further claims that he has not walked since admission. He was able to get up on the chair this morning and that's all he can do as of now.

## 2015-05-15 NOTE — Progress Notes (Signed)
Steve Phillips ZOX:[id number] DOB: 1946/11/01 DOA: 05/13/2015 PCP: [name], MD  Brief narrative: 69 y/o ? CAD 02/2012--he is s/p stent LAD + DES from in-stent re-stenosis 02/2015, DM ty 2, Parkinson's-s/p multiple 3 epidural spinal injections He presented initially to [facility] and had an extensive evalluation on the admission 6/16-->05/09/15 with [facility] seeing him as well as Dr. Arnoldo Morale of [facility] seeing him as well and no intervention was felt needed as there weas no neural cord compression findigns nor acute active pathology Represented to the emergency room 6/24 with severe lower extremity pain after just seen his cardiologist 6/23 with difficulty ambulating and incontinence of stool  Past medical history-As per Problem list Chart reviewed as below-   Consultants:  Neurosurgery consulted by emergency room  Cardiology  Procedures:  None  Antibiotics:  None   Subjective   Doing fair No issues LBP but not severe -is 6/10 currently No cp   Objective    Interim History:   Telemetry: Sinus   Objective: Filed Vitals:   05/14/15 1347 05/14/15 2117 05/14/15 2309 05/15/15 0604  BP: 182/85 157/79 166/74 159/67  Pulse: 108 88  76  Temp:  97.4 F (36.3 C)  97.6 F (36.4 C)  TempSrc:  Oral  Oral  Resp: 18 16  18   SpO2: 95% 98%  95%    Intake/Output Summary (Last 24 hours) at 05/15/15 0852 Last data filed at 05/15/15 0800  Gross per 24 hour  Intake      0 ml  Output   1575 ml  Net  -1575 ml    Exam:  General: EOMI NCAT pleasant and in less distress Cardiovascular: S1-S2 no murmur rub or gallop Respiratory: Clinically clear no added sound Abdomen: Soft nontender nondistended no rebound or guarding Skin: trace lower extremity edema, scab on front of left calf Neuro: power 5/5 throughout, objectively power in the left lower extremity seems improved compared to prior but the patient still has limitations to straight leg raise  Data Reviewed: Basic  Metabolic Panel:  Recent Labs Lab 05/09/15 0550 05/13/15 1051 05/14/15 0500 05/14/15 1440  NA 133* 137 134* 132*  K 4.4 4.5 4.5 4.6  CL 96* 102 100* 98*  CO2 27 25 25 23   GLUCOSE 415* 237* 336* 514*  BUN 24* 17 16 21*  CREATININE 1.32* 1.06 1.13 1.45*  CALCIUM 8.4* 9.2 9.0 8.9   Liver Function Tests:  Recent Labs Lab 05/14/15 0500  AST 21  ALT 6*  ALKPHOS 55  BILITOT 0.6  PROT 6.1*  ALBUMIN 3.4*   No results for input(s): LIPASE, AMYLASE in the last 168 hours. No results for input(s): AMMONIA in the last 168 hours. CBC:  Recent Labs Lab 05/09/15 0550 05/13/15 1051 05/14/15 0500  WBC 8.2 8.3 7.1  NEUTROABS  --  5.4  --   HGB 13.8 13.8 14.2  HCT 40.6 40.4 41.4  MCV 75.7* 76.1* 76.8*  PLT 190 253 244   Cardiac Enzymes: No results for input(s): CKTOTAL, CKMB, CKMBINDEX, TROPONINI in the last 168 hours. BNP: Invalid input(s): POCBNP CBG:  Recent Labs Lab 05/14/15 1331 05/14/15 1637 05/14/15 2116 05/15/15 0221 05/15/15 0602  GLUCAP 547* 476* 485* 378* 399*    No results found for this or any previous visit (from the past 240 hour(s)).   Studies:              All Imaging reviewed and is as per above notation   Scheduled Meds: . amLODipine  5 mg  Oral Daily  . atorvastatin  80 mg Oral q1800  . carbidopa-levodopa  1 tablet Oral TID  . carvedilol  25 mg Oral BID  . diclofenac sodium  2 g Topical QID  . DULoxetine  60 mg Oral Daily  . gabapentin  300 mg Oral QHS  . heparin  5,000 Units Subcutaneous 3 times per day  . lisinopril  20 mg Oral Daily   And  . hydrochlorothiazide  12.5 mg Oral Daily  . insulin aspart  38 Units Subcutaneous TID AC  . insulin glargine  10 Units Subcutaneous Daily  . LORazepam  1 mg Intravenous Once  . ondansetron  4 mg Oral Once  . pantoprazole  40 mg Oral BID  . polyethylene glycol  17 g Oral Daily  . senna  1 tablet Oral Daily   Continuous Infusions:    Assessment/Plan:   Principal Problem:   Lumbar cord  compression -doing somewhat better today. Wanting to sit in the chair. Transitioned off of Decadron 05/15/15 We will await input from neurosurgeon Dr. Webb Laws discussed the patient's case with Dr. Vertell Limber on 6/25 and he indicated that the earliest any type of provocative discography can be done with Plavix is 5 days from the last dose which would be 05/17/15 Appreciate cardiology input-IV cangrelor to start at their discretion-P2 Y 12 is currently 157 so no current indication at this stage Plavix was last taken 6/24 a.m. Continue medications for neuropathy including gabapentin 300 daily at bedtime-->twice a day, Cymbalta 60 daily   Added Percocet 5/325 on 6/26 We will continue Dilaudid 1 mg every 3 when necessary  Active Problems:   CAD (coronary artery disease)-patient with recent in-stent thrombosis status post DES placement 02/2015- appreciate cardiology guidance regarding Plavix/aspirin usage. Continue Coreg 25 twice a day., Continue lisinopril/HCTZ twice a day, continue amlodipine 5 daily-->10 daily 6/26 given uncontrolled blood pressure    Hypertension-see above--   Hyperlipidemia-I will change Zocor 22 high intensity dosing Lipitoriven recent stent thrombosis   DM (diabetes mellitus), type 2 with neurological complications-he sees Dr. Cruzita Lederer of endocrinology who usually has this patient on U500 20 units 3 times a day----> because of persisting blood sugars being uncontrolled over the past 48 hours, increased dose on 6/26-38 units 3 times a day - uncontrolled because of IV Decadron that has been given in terms of concern for acute cord compression.   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

## 2015-05-15 NOTE — Progress Notes (Signed)
05/15/15 1812 nsg Texted page MD re: CBG [id number].

## 2015-05-15 NOTE — Progress Notes (Signed)
05/15/15 1749 nsg RN gave Humulin R U-500 to pharmacy for safe keeping. Advised patient to get it on discharge day.

## 2015-05-15 NOTE — Progress Notes (Signed)
05/15/15 1833 nsg Humulin R 500 insulin gave back to patient per pharmacist advised. Per pharmacy they don't stock it downstairs and it has been discontinued by MD.

## 2015-05-16 ENCOUNTER — Ambulatory Visit: Payer: Medicare Other | Admitting: Internal Medicine

## 2015-05-16 ENCOUNTER — Encounter (HOSPITAL_COMMUNITY): Payer: Self-pay | Admitting: General Practice

## 2015-05-16 DIAGNOSIS — G9520 Unspecified cord compression: Secondary | ICD-10-CM

## 2015-05-16 DIAGNOSIS — R29898 Other symptoms and signs involving the musculoskeletal system: Secondary | ICD-10-CM

## 2015-05-16 LAB — CBC WITH DIFFERENTIAL/PLATELET
Basophils Absolute: 0 10*3/uL (ref 0.0–0.1)
Basophils Relative: 0 % (ref 0–1)
EOS PCT: 0 % (ref 0–5)
Eosinophils Absolute: 0 10*3/uL (ref 0.0–0.7)
HCT: 39.3 % (ref 39.0–52.0)
HEMOGLOBIN: 13.4 g/dL (ref 13.0–17.0)
LYMPHS PCT: 12 % (ref 12–46)
Lymphs Abs: 1.8 10*3/uL (ref 0.7–4.0)
MCH: 26.3 pg (ref 26.0–34.0)
MCHC: 34.1 g/dL (ref 30.0–36.0)
MCV: 77.1 fL — AB (ref 78.0–100.0)
MONO ABS: 1.1 10*3/uL — AB (ref 0.1–1.0)
Monocytes Relative: 7 % (ref 3–12)
Neutro Abs: 12.5 10*3/uL — ABNORMAL HIGH (ref 1.7–7.7)
Neutrophils Relative %: 81 % — ABNORMAL HIGH (ref 43–77)
Platelets: 262 10*3/uL (ref 150–400)
RBC: 5.1 MIL/uL (ref 4.22–5.81)
RDW: 18.3 % — ABNORMAL HIGH (ref 11.5–15.5)
WBC: 15.4 10*3/uL — ABNORMAL HIGH (ref 4.0–10.5)

## 2015-05-16 LAB — PLATELET INHIBITION P2Y12: PLATELET FUNCTION P2Y12: 192 [PRU] — AB (ref 194–418)

## 2015-05-16 LAB — COMPREHENSIVE METABOLIC PANEL
ALT: 7 U/L — AB (ref 17–63)
AST: 16 U/L (ref 15–41)
Albumin: 3.3 g/dL — ABNORMAL LOW (ref 3.5–5.0)
Alkaline Phosphatase: 53 U/L (ref 38–126)
Anion gap: 8 (ref 5–15)
BUN: 25 mg/dL — ABNORMAL HIGH (ref 6–20)
CO2: 29 mmol/L (ref 22–32)
Calcium: 8.9 mg/dL (ref 8.9–10.3)
Chloride: 100 mmol/L — ABNORMAL LOW (ref 101–111)
Creatinine, Ser: 1.14 mg/dL (ref 0.61–1.24)
GLUCOSE: 307 mg/dL — AB (ref 65–99)
Potassium: 4.6 mmol/L (ref 3.5–5.1)
SODIUM: 137 mmol/L (ref 135–145)
Total Bilirubin: 0.4 mg/dL (ref 0.3–1.2)
Total Protein: 5.7 g/dL — ABNORMAL LOW (ref 6.5–8.1)

## 2015-05-16 LAB — GLUCOSE, CAPILLARY
GLUCOSE-CAPILLARY: 295 mg/dL — AB (ref 65–99)
GLUCOSE-CAPILLARY: 255 mg/dL — AB (ref 65–99)
Glucose-Capillary: 301 mg/dL — ABNORMAL HIGH (ref 65–99)
Glucose-Capillary: 303 mg/dL — ABNORMAL HIGH (ref 65–99)
Glucose-Capillary: 210 mg/dL — ABNORMAL HIGH (ref 65–99)

## 2015-05-16 MED ORDER — INSULIN GLARGINE 100 UNIT/ML ~~LOC~~ SOLN
20.0000 [IU] | Freq: Every day | SUBCUTANEOUS | Status: DC
  Administered 2015-05-16 – 2015-05-18 (×3): 20 [IU] via SUBCUTANEOUS
  Filled 2015-05-16 (×5): qty 0.2

## 2015-05-16 NOTE — Progress Notes (Signed)
Patient Profile: 69 y/o male with h/o CAD s/p recent coronary stent occlusion. S/p DES resenting of LAD 4/16, on ASA + Plavix. Also with Parkinson's Disease and admitted for severe LBP with progressive neurogenic symptoms concerning for cord compression. Awaiting neurosurgical consult for possible surgery.   Subjective: Still with some back and leg pain. Denies CP and dyspnea.    Objective: Vital signs in last 24 hours: Temp:  [97.5 F (36.4 C)-98.2 F (36.8 C)] 97.5 F (36.4 C) (06/27 0447) Pulse Rate:  [63-75] 63 (06/27 0447) Resp:  [17-20] 18 (06/27 0447) BP: (156-159)/(60-67) 156/67 mmHg (06/27 0447) SpO2:  [97 %-98 %] 97 % (06/27 0447) Last BM Date: 05/12/15  Intake/Output from previous day: 06/26 0701 - 06/27 0700 In: -  Out: 1300 [Urine:1300] Intake/Output this shift:    Medications Current Facility-Administered Medications  Medication Dose Route Frequency Provider Last Rate Last Dose  . amLODipine (NORVASC) tablet 10 mg  10 mg Oral Daily Nita Sells, MD   10 mg at 05/15/15 1109  . atorvastatin (LIPITOR) tablet 80 mg  80 mg Oral q1800 Nita Sells, MD   80 mg at 05/15/15 1725  . carbidopa-levodopa (SINEMET IR) 25-100 MG per tablet immediate release 1 tablet  1 tablet Oral BID Nita Sells, MD   1 tablet at 05/15/15 2202  . carvedilol (COREG) tablet 25 mg  25 mg Oral BID Nita Sells, MD   25 mg at 05/15/15 2202  . diclofenac sodium (VOLTAREN) 1 % transdermal gel 2 g  2 g Topical QID Nita Sells, MD   2 g at 05/15/15 2202  . DULoxetine (CYMBALTA) DR capsule 60 mg  60 mg Oral Daily Nita Sells, MD   60 mg at 05/15/15 1112  . gabapentin (NEURONTIN) capsule 300 mg  300 mg Oral BID Nita Sells, MD   300 mg at 05/15/15 2336  . heparin injection 5,000 Units  5,000 Units Subcutaneous 3 times per day Nita Sells, MD   5,000 Units at 05/16/15 8546  . lisinopril (PRINIVIL,ZESTRIL) tablet 20 mg  20 mg Oral Daily  Wynell Balloon, RPH   20 mg at 05/15/15 1114   And  . hydrochlorothiazide (MICROZIDE) capsule 12.5 mg  12.5 mg Oral Daily Wynell Balloon, RPH   12.5 mg at 05/15/15 1114  . HYDROmorphone (DILAUDID) injection 1 mg  1 mg Intravenous Q3H PRN Nita Sells, MD   1 mg at 05/15/15 2215  . insulin aspart (novoLOG) injection 38 Units  38 Units Subcutaneous TID AC Nita Sells, MD   38 Units at 05/16/15 2703  . insulin glargine (LANTUS) injection 10 Units  10 Units Subcutaneous Daily Nita Sells, MD   10 Units at 05/15/15 1113  . LORazepam (ATIVAN) injection 1 mg  1 mg Intravenous Once Nita Sells, MD      . ondansetron (ZOFRAN-ODT) disintegrating tablet 4 mg  4 mg Oral Once Nita Sells, MD      . oxyCODONE-acetaminophen (PERCOCET/ROXICET) 5-325 MG per tablet 1 tablet  1 tablet Oral Q4H PRN Nita Sells, MD   1 tablet at 05/15/15 2008   And  . oxyCODONE (Oxy IR/ROXICODONE) immediate release tablet 5 mg  5 mg Oral Q4H PRN Nita Sells, MD   5 mg at 05/15/15 2008  . pantoprazole (PROTONIX) EC tablet 40 mg  40 mg Oral BID Nita Sells, MD   40 mg at 05/15/15 2202  . polyethylene glycol (MIRALAX / GLYCOLAX) packet 17 g  17 g Oral Daily Nita Sells, MD  17 g at 05/15/15 1115  . promethazine (PHENERGAN) tablet 12.5 mg  12.5 mg Oral Q6H PRN Nita Sells, MD      . senna (SENOKOT) tablet 8.6 mg  1 tablet Oral Daily Nita Sells, MD   8.6 mg at 05/15/15 1115    PE: General appearance: alert, cooperative and no distress Neck: no carotid bruit and no JVD Lungs: clear to auscultation bilaterally Heart: regular rate and rhythm, S1, S2 normal, no murmur, click, rub or gallop Extremities: no LEE Pulses: 2+ and symmetric Skin: warm and dry Neurologic: Grossly normal  Lab Results:   Recent Labs  05/13/15 1051 05/14/15 0500 05/16/15 0400  WBC 8.3 7.1 15.4*  HGB 13.8 14.2 13.4  HCT 40.4 41.4 39.3  PLT 253 244 262    BMET  Recent Labs  05/14/15 0500 05/14/15 1440 05/16/15 0400  NA 134* 132* 137  K 4.5 4.6 4.6  CL 100* 98* 100*  CO2 25 23 29   GLUCOSE 336* 514* 307*  BUN 16 21* 25*  CREATININE 1.13 1.45* 1.14  CALCIUM 9.0 8.9 8.9   PT/INR  Recent Labs  05/13/15 1839 05/14/15 0500  LABPROT 13.8 13.5  INR 1.04 1.01     Assessment/Plan  Principal Problem:   Lumbar cord compression Active Problems:   CAD (coronary artery disease)   Hypertension   Hyperlipidemia   DM (diabetes mellitus), type 2 with neurological complications   Benign prostatic hyperplasia with urinary obstruction   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 CAD: s/p recent restenting of LAD 2/2 stent occlusion 4/16. Now with plans for possible surgery for potential lumbar cord compression. Plavix is on hold. P2Y12 level today is 192. Once level is >208, begin cangrelor IV - then stop cangrelor before procedure. Post procedure, depending on result, reload on plavix 300 mg and resume 75 mg daily. Continue BB and statin.   2. HTN:  Moderately elevated. Amlodipine increased yesterday to 10 mg. Continue ACE-I/diuretic and BB.   3. HLD: continue statin therapy.    LOS: 3 days    Brittainy M. Ladoris Gene 05/16/2015 7:15 AM  Personally seen and examined. Agree with above. Reviewed Dr. Fletcher [name] note Left leg weakness Recent DES. He and wife understand risk of thrombosis.  IV cangrelor (onset within minutes). Will be out of system in 2 hours after stopping prior to surgery  Candee Furbish, MD

## 2015-05-16 NOTE — Progress Notes (Signed)
Pt c/o nausea; Pt given 12.5mg  PO Phenergan at this time; will cont. To monitor.

## 2015-05-16 NOTE — Care Management Note (Addendum)
Case Management Note  Patient Details  Name: Steve Phillips MRN: [id number] Date of Birth: 22-Sep-1946  Subjective/Objective:     Pt admited with progressive weakness ?cord compression- awaiting neuro consult               Action/Plan: PTA pt lived at home with spouse- was active with [facility] for [facility] services- RN/PT/OT/aide- will need resumption orders if returns home- NCM to follow for d/c plans and needs  Expected Discharge Date:                  Expected Discharge Plan:  [place]  In-House Referral:     Discharge planning Services  CM Consult  Post Acute Care Choice:  Resumption of Svcs/PTA Provider Choice offered to:     DME Arranged:    DME Agency:     HH Arranged:    [place]:  [place]  Status of Service:  In process, will continue to follow  Medicare Important Message Given:  Yes-second notification given Date Medicare IM Given:    Medicare IM give by:    Date Additional Medicare IM Given:    Additional Medicare Important Message give by:     If discussed at [place] of Stay Meetings, dates discussed:    Additional Comments:  Dawayne Patricia, RN 05/16/2015, 2:22 PM

## 2015-05-16 NOTE — Clinical Social Work Note (Signed)
CSW received consult for patient wanting to go to SNF.  CSW spoke to patient to discuss SNF placement once therapy has seen him.  CSW spoke to patient who stated he will be having a MRI done tomorrow, and CSW will continue to follow patient throughout discharge planning and will prepare information for SNF placement.  CSW asked physician if he could get some PT and OT orders which will be needed for SNF placement.  Formal assessment to follow.   Jones Broom. [place], MSW, [place] 05/16/2015 4:59 PM

## 2015-05-16 NOTE — Care Management (Signed)
Important Message  Patient Details  Name: Steve Phillips MRN: [id number] Date of Birth: 1946/09/30   Medicare Important Message Given:  Yes-second notification given    Nathen May 05/16/2015, 1:10 PM

## 2015-05-16 NOTE — Progress Notes (Addendum)
Inpatient Diabetes Program Recommendations  AACE/ADA: New Consensus Statement on Inpatient Glycemic Control (2013)  Target Ranges:  Prepandial:   less than 140 mg/dL      Peak postprandial:   less than 180 mg/dL (1-2 hours)      Critically ill patients:  140 - 180 mg/dL   Pt takes U-500 at home which has been the only effective insulin to control his blood sugars. On 05/09/15 pt was ordered his U-500 dose as is at home-Wife can bring in the pen to use and store in the pharmacy. Recommend starting U-500 ac supper tonight in addition to correction required. Then discontinue the correction and use the U-500 only using home doses of 20 units ac breakfast, 20 units ac lunch and 30 units ac supper.  Addendum: Have spoken at length with patient's wife. Glucose elevations are of concern to her and again she stresses that pt needs the U-500 insulin while here. Would recommend using 1/2 home doses of U-500 which would be 10 units breakfast and lunch and 15 units at supper. Could even reduce to 1/3 home dose if hypoglycemia is a concern. Patient's U-500 is still stored in the pharmacy and the pen needles are with his wife. Please consider as glucose is extremely high. Thank you Rosita Kea, RN, MSN, CDE  Diabetes Inpatient Program Office: [phone] Pager: [phone] 8:00 am to 5:00 pm    Thank you Rosita Kea, RN, MSN, CDE  Diabetes Inpatient Program Office: [phone] Pager: [phone] 8:00 am to 5:00 pm

## 2015-05-16 NOTE — Progress Notes (Signed)
Utilization review completed.  

## 2015-05-16 NOTE — Progress Notes (Signed)
ATSUSHI YOM NLG:[id number] DOB: 13-Jul-1946 DOA: 05/13/2015 PCP: Rica Mast, MD  Brief narrative: 69 y/o ? CAD 02/2012--he is s/p stent LAD + DES from in-stent re-stenosis 02/2015, DM ty 2, Parkinson's-s/p multiple 3 epidural spinal injections He presented initially to [facility] and had an extensive evalluation on the admission 6/16-->05/09/15 with VVS seeing him as well as Dr. Arnoldo Morale of NS seeing him as well and no intervention was felt needed as there weas no neural cord compression findigns nor acute active pathology Represented to the emergency room 6/24 with severe lower extremity pain after just seen his cardiologist 6/23 with difficulty ambulating and incontinence of stool. Patient was seen by Cardiology and it was eelcted to d/c plavix which was done on 6/24 His Py212 was measured and consideraiton was given for use of IV Cengrelor for anti-plt activity as he had a recent stent NS was consulted as well   Past medical history-As per Problem list Chart reviewed as below-   Consultants:  Neurosurgery consulted by emergency room  Cardiology  Procedures:  None  Antibiotics:  None   Subjective   slight n and abd discomfort today Felt a little flushed aas well  No CP   Objective    Interim History:   Telemetry: Sinus   Objective: Filed Vitals:   05/15/15 1959 05/16/15 0447 05/16/15 1033 05/16/15 1435  BP: 158/60 156/67 156/58 129/58  Pulse: 71 63  54  Temp: 98.2 F (36.8 C) 97.5 F (36.4 C)  97.5 F (36.4 C)  TempSrc: Oral Oral  Oral  Resp: 17 18  18   SpO2: 98% 97%  97%    Intake/Output Summary (Last 24 hours) at 05/16/15 1604 Last data filed at 05/15/15 2205  Gross per 24 hour  Intake      0 ml  Output    900 ml  Net   -900 ml    Exam:  General: EOMI NCAT  Cardiovascular: S1-S2 no murmur rub or gallop Respiratory: Clinically clear no added sound Abdomen: Soft nontender non-distended.  No rebound Skin: trace lower  extremity edema, scab on front of left calf Neuro: power 5/5 throughout, objectively power in the left lower extremity seems improved compared to prior   Data Reviewed: Basic Metabolic Panel:  Recent Labs Lab 05/13/15 1051 05/14/15 0500 05/14/15 1440 05/16/15 0400  NA 137 134* 132* 137  K 4.5 4.5 4.6 4.6  CL 102 100* 98* 100*  CO2 25 25 23 29   GLUCOSE 237* 336* 514* 307*  BUN 17 16 21* 25*  CREATININE 1.06 1.13 1.45* 1.14  CALCIUM 9.2 9.0 8.9 8.9   Liver Function Tests:  Recent Labs Lab 05/14/15 0500 05/16/15 0400  AST 21 16  ALT 6* 7*  ALKPHOS 55 53  BILITOT 0.6 0.4  PROT 6.1* 5.7*  ALBUMIN 3.4* 3.3*   No results for input(s): LIPASE, AMYLASE in the last 168 hours. No results for input(s): AMMONIA in the last 168 hours. CBC:  Recent Labs Lab 05/13/15 1051 05/14/15 0500 05/16/15 0400  WBC 8.3 7.1 15.4*  NEUTROABS 5.4  --  12.5*  HGB 13.8 14.2 13.4  HCT 40.4 41.4 39.3  MCV 76.1* 76.8* 77.1*  PLT 253 244 262   Cardiac Enzymes: No results for input(s): CKTOTAL, CKMB, CKMBINDEX, TROPONINI in the last 168 hours. BNP: Invalid input(s): POCBNP CBG:  Recent Labs Lab 05/15/15 1621 05/15/15 2109 05/16/15 0213 05/16/15 0602 05/16/15 1128  GLUCAP 430* 449* 301* 303* 295*    No results found  for this or any previous visit (from the past 240 hour(s)).   Studies:              All Imaging reviewed and is as per above notation   Scheduled Meds: . amLODipine  10 mg Oral Daily  . atorvastatin  80 mg Oral q1800  . carbidopa-levodopa  1 tablet Oral BID  . carvedilol  25 mg Oral BID  . diclofenac sodium  2 g Topical QID  . DULoxetine  60 mg Oral Daily  . gabapentin  300 mg Oral BID  . heparin  5,000 Units Subcutaneous 3 times per day  . lisinopril  20 mg Oral Daily   And  . hydrochlorothiazide  12.5 mg Oral Daily  . insulin aspart  38 Units Subcutaneous TID AC  . insulin glargine  20 Units Subcutaneous Daily  . LORazepam  1 mg Intravenous Once  .  ondansetron  4 mg Oral Once  . pantoprazole  40 mg Oral BID  . polyethylene glycol  17 g Oral Daily  . senna  1 tablet Oral Daily   Continuous Infusions:    Assessment/Plan:   Principal Problem:   Lumbar cord compression -doing somewhat better today. Wanting to sit in the chair. Transitioned off of Decadron 05/15/15 as symtpoms not entirely consistent with cord compression We will await input from neurosurgeon Dr. Samuella Cota discussed the patient's case with Dr. Vertell Limber on 6/25 and he indicated that the earliest any type of provocative discography can be done with Plavix is 5 days from the last dose which would be 05/17/15 Appreciate cardiology input-IV cangrelor to start if P2Y12  above 208 so no current indication at this stage Plavix was last taken 6/24 a.m. Continue medications for neuropathy including gabapentin 300 daily at bedtime-->twice a day, Cymbalta 60 daily   Added Percocet 5/325 on 6/26 We will continue Dilaudid 1 mg every 3 when necessary  Active Problems:   CAD (coronary artery disease)-patient with recent in-stent thrombosis status post DES placement 02/2015- appreciate cardiology guidance regarding Plavix/aspirin usage. Continue Coreg 25 twice a day., Continue lisinopril/HCTZ twice a day, continue amlodipine 5 daily-->10 daily 6/26 given uncontrolled blood pressure Improved to some extent    Hypertension-see above   Hyperlipidemia-I will change Zocor 2 high intensity dosing Lipitor given recent stent thrombosis   DM (diabetes mellitus), type 2 with neurological complications-he sees Dr. Cruzita Lederer of endocrinology who usually has this patient on U500 20 units 3 times a day----> n6/26-38 units 3 times a day - uncontrolled because of IV Decadron that has been given in terms of concern for acute cord compression. 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

## 2015-05-16 NOTE — Progress Notes (Signed)
Patient ID: Steve CREVELING, male   DOB: 1946-08-26, 69 y.o.   MRN: [id number] Contacted by Dr. Alinda Dooms regarding patient's and family's desire to talk to neurosurgery in regards to upcoming discography. I will pass toward on to Dr. Arnoldo Morale did his in-hospital consultation last week to discuss the situation with the patient and family.

## 2015-05-17 ENCOUNTER — Inpatient Hospital Stay (HOSPITAL_COMMUNITY): Payer: Medicare Other

## 2015-05-17 LAB — GLUCOSE, CAPILLARY
GLUCOSE-CAPILLARY: 66 mg/dL (ref 65–99)
Glucose-Capillary: 212 mg/dL — ABNORMAL HIGH (ref 65–99)
Glucose-Capillary: 254 mg/dL — ABNORMAL HIGH (ref 65–99)
Glucose-Capillary: 134 mg/dL — ABNORMAL HIGH (ref 65–99)
Glucose-Capillary: 90 mg/dL (ref 65–99)

## 2015-05-17 LAB — PLATELET INHIBITION P2Y12: Platelet Function  P2Y12: 194 [PRU] (ref 194–418)

## 2015-05-17 MED ORDER — LIDOCAINE HCL (PF) 1 % IJ SOLN
INTRAMUSCULAR | Status: AC
  Filled 2015-05-17: qty 5

## 2015-05-17 MED ORDER — INSULIN ASPART 100 UNIT/ML ~~LOC~~ SOLN
18.0000 [IU] | Freq: Once | SUBCUTANEOUS | Status: AC
  Administered 2015-05-17: 18 [IU] via SUBCUTANEOUS

## 2015-05-17 MED ORDER — IOHEXOL 180 MG/ML  SOLN
20.0000 mL | Freq: Once | INTRAMUSCULAR | Status: AC | PRN
  Administered 2015-05-17: 9 mL via INTRATHECAL

## 2015-05-17 MED ORDER — CLOPIDOGREL BISULFATE 75 MG PO TABS
75.0000 mg | ORAL_TABLET | Freq: Every day | ORAL | Status: DC
Start: 2015-05-18 — End: 2015-05-19
  Administered 2015-05-18 – 2015-05-19 (×2): 75 mg via ORAL
  Filled 2015-05-17 (×2): qty 1

## 2015-05-17 MED ORDER — SORBITOL 70 % SOLN
40.0000 mL | Freq: Every day | Status: DC | PRN
Start: 2015-05-17 — End: 2015-05-19
  Filled 2015-05-17: qty 60

## 2015-05-17 MED ORDER — CLOPIDOGREL BISULFATE 300 MG PO TABS
300.0000 mg | ORAL_TABLET | Freq: Once | ORAL | Status: AC
Start: 2015-05-17 — End: 2015-05-17
  Administered 2015-05-17: 300 mg via ORAL
  Filled 2015-05-17: qty 1

## 2015-05-17 NOTE — Progress Notes (Signed)
Patient Profile: 69 y/o male with h/o CAD s/p recent coronary stent occlusion. S/p DES resenting of LAD 4/16, on ASA + Plavix. Also with Parkinson's Disease and admitted for severe LBP with progressive neurogenic symptoms concerning for cord compression. Neurosurgical consult noted.   Subjective: Working with PT.    Objective: Vital signs in last 24 hours: Temp:  [97.5 F (36.4 C)-98 F (36.7 C)] 97.8 F (36.6 C) (06/28 0456) Pulse Rate:  [51-59] 52 (06/28 0456) Resp:  [18] 18 (06/28 0456) BP: (123-154)/(58-67) 154/67 mmHg (06/28 1014) SpO2:  [97 %] 97 % (06/28 0456) Last BM Date: 05/12/15  Intake/Output from previous day: 06/27 0701 - 06/28 0700 In: 720 [P.O.:720] Out: 950 [Urine:950] Intake/Output this shift: Total I/O In: 240 [P.O.:240] Out: 475 [Urine:475]  Medications Current Facility-Administered Medications  Medication Dose Route Frequency Provider Last Rate Last Dose  . amLODipine (NORVASC) tablet 10 mg  10 mg Oral Daily Nita Sells, MD   10 mg at 05/17/15 1014  . atorvastatin (LIPITOR) tablet 80 mg  80 mg Oral q1800 Nita Sells, MD   80 mg at 05/16/15 1731  . carbidopa-levodopa (SINEMET IR) 25-100 MG per tablet immediate release 1 tablet  1 tablet Oral BID Nita Sells, MD   1 tablet at 05/17/15 1015  . carvedilol (COREG) tablet 25 mg  25 mg Oral BID Nita Sells, MD   25 mg at 05/17/15 1016  . diclofenac sodium (VOLTAREN) 1 % transdermal gel 2 g  2 g Topical QID Nita Sells, MD   2 g at 05/17/15 1016  . DULoxetine (CYMBALTA) DR capsule 60 mg  60 mg Oral Daily Nita Sells, MD   60 mg at 05/17/15 1016  . gabapentin (NEURONTIN) capsule 300 mg  300 mg Oral BID Nita Sells, MD   300 mg at 05/17/15 1015  . heparin injection 5,000 Units  5,000 Units Subcutaneous 3 times per day Nita Sells, MD   5,000 Units at 05/17/15 0539  . lisinopril (PRINIVIL,ZESTRIL) tablet 20 mg  20 mg Oral Daily Wynell Balloon, RPH    20 mg at 05/17/15 1015   And  . hydrochlorothiazide (MICROZIDE) capsule 12.5 mg  12.5 mg Oral Daily Wynell Balloon, RPH   12.5 mg at 05/17/15 1016  . HYDROmorphone (DILAUDID) injection 1 mg  1 mg Intravenous Q3H PRN Nita Sells, MD   1 mg at 05/17/15 0655  . insulin aspart (novoLOG) injection 38 Units  38 Units Subcutaneous TID AC Nita Sells, MD   38 Units at 05/17/15 0726  . insulin glargine (LANTUS) injection 20 Units  20 Units Subcutaneous Daily Nita Sells, MD   20 Units at 05/17/15 1016  . LORazepam (ATIVAN) injection 1 mg  1 mg Intravenous Once Nita Sells, MD      . ondansetron (ZOFRAN-ODT) disintegrating tablet 4 mg  4 mg Oral Once Nita Sells, MD      . oxyCODONE-acetaminophen (PERCOCET/ROXICET) 5-325 MG per tablet 1 tablet  1 tablet Oral Q4H PRN Nita Sells, MD   1 tablet at 05/15/15 2008   And  . oxyCODONE (Oxy IR/ROXICODONE) immediate release tablet 5 mg  5 mg Oral Q4H PRN Nita Sells, MD   5 mg at 05/16/15 0811  . pantoprazole (PROTONIX) EC tablet 40 mg  40 mg Oral BID Nita Sells, MD   40 mg at 05/17/15 1016  . polyethylene glycol (MIRALAX / GLYCOLAX) packet 17 g  17 g Oral Daily Nita Sells, MD   17 g at 05/17/15 1017  .  promethazine (PHENERGAN) tablet 12.5 mg  12.5 mg Oral Q6H PRN Nita Sells, MD   12.5 mg at 05/16/15 1337  . senna (SENOKOT) tablet 8.6 mg  1 tablet Oral Daily Nita Sells, MD   8.6 mg at 05/17/15 1016  . sorbitol 70 % solution 40 mL  40 mL Oral Daily PRN Nita Sells, MD        PE: General appearance: alert, cooperative and no distress Neck: no carotid bruit and no JVD Lungs: clear to auscultation bilaterally Heart: regular rate and rhythm, S1, S2 normal, no murmur, click, rub or gallop Extremities: no LEE Pulses: 2+ and symmetric Skin: warm and dry Neurologic: Grossly normal  Lab Results:   Recent Labs  05/16/15 0400  WBC 15.4*  HGB 13.4  HCT 39.3    PLT 262   BMET  Recent Labs  05/14/15 1440 05/16/15 0400  NA 132* 137  K 4.6 4.6  CL 98* 100*  CO2 23 29  GLUCOSE 514* 307*  BUN 21* 25*  CREATININE 1.45* 1.14  CALCIUM 8.9 8.9   PT/INR No results for input(s): LABPROT, INR in the last 72 hours.   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 CAD: s/p recent restenting of LAD 2/2 stent occlusion 4/16. Now with plans for possible surgery for potential lumbar cord compression. Plavix is on hold since 6/24. P2Y12 level today is 194 (from 192). Once level is >208, begin cangrelor IV - then stop cangrelor before procedure. Post procedure, depending on result, reload on plavix 300 mg and resume 75 mg daily. Continue BB and statin.   2. HTN:  Moderately elevated. Amlodipine increased yesterday to 10 mg. Continue ACE-I/diuretic and BB.   3. HLD: continue statin therapy.   Reviewed Dr. Fletcher [name] note Left leg weakness Recent DES. He and wife understand risk of thrombosis.  IV cangrelor (onset within minutes). Will be out of system in 2 hours after stopping prior to surgery  Candee Furbish, MD

## 2015-05-17 NOTE — Progress Notes (Signed)
Rehab Admissions Coordinator Note:  Patient was screened by Sariya Trickey L for appropriateness for an Inpatient Acute Rehab Consult.  At this time, we are recommending Inpatient Rehab consult. A clear plan from neurosx will also need to be determined.  Adalin Vanderploeg L 05/17/2015, 2:06 PM  I can be reached at [phone].

## 2015-05-17 NOTE — Procedures (Signed)
Per request of Dr. Toniann Fail, lumbar myelogram was arranged.  Patient on Cymbalta however, after discussion with Dr. Toniann Fail, it was felt that the patient needed to have myelogram today and get back on blood thinners.  Theoretical increase  risk of seizures.  This was reviewed with the patient who agreed to proceed.  L3-4 LP performed with single pass 22 g spinal needle.  Clear CSF noted.  9 cc of Omnipaque 180 instilled.  Myelogram performed without difficulty.  CT to follow.  Reviewed post procedure instructions.

## 2015-05-17 NOTE — Progress Notes (Signed)
Pt stable during shift; no seizure activity witnessed or reported; pt sleeping comfortably in bed with call light within reach and wife at bedside. Back bandaid dsg remains clean, dry and intact; no stain, hematoma, drainage or active bleeding noted; Reported off to incoming RN. Francis Gaines Kaysey Berndt RN.

## 2015-05-17 NOTE — Progress Notes (Signed)
Pt back to room from procedure; pt alert and verbally responsive; VSS, telemetry reapplied and verified; family remains at bedside; per order pt to be on bedrest for 4hours and pt educated and voices understanding for the bedrest. Myelogram site dsg remains clean dry and intact; no active bleeding, hematoma or drainage noted; Dr. Jeannine Kitten called to inform RN to closely monitor for seizure activity; will closely monitor pt. Francis Gaines Mazzy Santarelli RN.

## 2015-05-17 NOTE — Progress Notes (Signed)
Patient ID: Steve Phillips, male   DOB: [birth year], 69 y.o.   MRN: [id number] i have reviewed the films and also read the radiologists report. There just is not any evidence of left sided nerve root compression to explain his severe difficulty. I guess that he should just try some PT and possibly get a rehab MD consult, but there is no evidence of a neurosurgical answer to his pain. He can start back on his plavix at any time as no surgery is imminent or indicated.

## 2015-05-17 NOTE — Progress Notes (Signed)
Steve Phillips ZJQ:[id number] DOB: 01-Jun-1946 DOA: 05/13/2015 PCP: Rica Mast, MD  Brief narrative: 69 y/o ? CAD 02/2012--he is s/p stent LAD + DES from in-stent re-stenosis 02/2015, DM ty 2, Parkinson's-s/p multiple 3 epidural spinal injections He presented initially to [facility] and had an extensive evalluation on the admission 6/16-->05/09/15 with [facility] seeing him as well as Dr. Arnoldo Morale of [facility] seeing him as well and no intervention was felt needed as there weas no neural cord compression findigns nor acute active pathology Represented to the emergency room 6/24 with severe lower extremity pain after just seen his cardiologist 6/23 with difficulty ambulating and incontinence of stool. Patient was seen by Cardiology and it was eelcted to d/c plavix which was done on 6/24 His Py212 was measured and consideraiton was given for use of IV Cengrelor for anti-plt activity as he had a recent stent NS was consulted as well and is to see the patient to coordinate work-up of his severe LBP  Past medical history-As per Problem list Chart reviewed as below-   Consultants:  Neurosurgery consulted by emergency room  Cardiology  Procedures:  None  Antibiotics:  None   Subjective   Better no n/v no cp Controlling stool No blurred or double vision   Objective    Interim History:   Telemetry: Sinus   Objective: Filed Vitals:   05/16/15 1435 05/16/15 2104 05/16/15 2314 05/17/15 0456  BP: 129/58 123/64 140/66 125/60  Pulse: 54 51 59 52  Temp: 97.5 F (36.4 C) 98 F (36.7 C)  97.8 F (36.6 C)  TempSrc: Oral Oral  Oral  Resp: 18 18  18   SpO2: 97% 97%  97%    Intake/Output Summary (Last 24 hours) at 05/17/15 1011 Last data filed at 05/17/15 0800  Gross per 24 hour  Intake    720 ml  Output   1425 ml  Net   -705 ml    Exam:  General: EOMI NCAT  Cardiovascular: S1-S2 no murmur rub or gallop Respiratory: Clinically clear no added sound Abdomen: Soft  nontender non-distended.  No rebound Skin: trace lower extremity edema, scab on front of left calf Neuro: power 5/5 throughout, objectively power in the left lower extremity seems improved compared to prior   Data Reviewed: Basic Metabolic Panel:  Recent Labs Lab 05/13/15 1051 05/14/15 0500 05/14/15 1440 05/16/15 0400  NA 137 134* 132* 137  K 4.5 4.5 4.6 4.6  CL 102 100* 98* 100*  CO2 25 25 23 29   GLUCOSE 237* 336* 514* 307*  BUN 17 16 21* 25*  CREATININE 1.06 1.13 1.45* 1.14  CALCIUM 9.2 9.0 8.9 8.9   Liver Function Tests:  Recent Labs Lab 05/14/15 0500 05/16/15 0400  AST 21 16  ALT 6* 7*  ALKPHOS 55 53  BILITOT 0.6 0.4  PROT 6.1* 5.7*  ALBUMIN 3.4* 3.3*   No results for input(s): LIPASE, AMYLASE in the last 168 hours. No results for input(s): AMMONIA in the last 168 hours. CBC:  Recent Labs Lab 05/13/15 1051 05/14/15 0500 05/16/15 0400  WBC 8.3 7.1 15.4*  NEUTROABS 5.4  --  12.5*  HGB 13.8 14.2 13.4  HCT 40.4 41.4 39.3  MCV 76.1* 76.8* 77.1*  PLT 253 244 262   Cardiac Enzymes: No results for input(s): CKTOTAL, CKMB, CKMBINDEX, TROPONINI in the last 168 hours. BNP: Invalid input(s): POCBNP CBG:  Recent Labs Lab 05/16/15 0602 05/16/15 1128 05/16/15 1640 05/16/15 2114 05/17/15 0604  GLUCAP 303* 295* 255* 210* 212*  No results found for this or any previous visit (from the past 240 hour(s)).   Studies:              All Imaging reviewed and is as per above notation   Scheduled Meds: . amLODipine  10 mg Oral Daily  . atorvastatin  80 mg Oral q1800  . carbidopa-levodopa  1 tablet Oral BID  . carvedilol  25 mg Oral BID  . diclofenac sodium  2 g Topical QID  . DULoxetine  60 mg Oral Daily  . gabapentin  300 mg Oral BID  . heparin  5,000 Units Subcutaneous 3 times per day  . lisinopril  20 mg Oral Daily   And  . hydrochlorothiazide  12.5 mg Oral Daily  . insulin aspart  38 Units Subcutaneous TID AC  . insulin glargine  20 Units  Subcutaneous Daily  . LORazepam  1 mg Intravenous Once  . ondansetron  4 mg Oral Once  . pantoprazole  40 mg Oral BID  . polyethylene glycol  17 g Oral Daily  . senna  1 tablet Oral Daily   Continuous Infusions:    Assessment/Plan:   Principal Problem:   Lumbar cord compression -doing somewhat better today. Wanting to sit in the chair. Transitioned off of Decadron 05/15/15 as symtpoms not entirely consistent with cord compression We will await input from neurosurgeon Dr. Samuella Cota discussed the patient's case with Dr. Vertell Limber on 6/25 and he indicated that the earliest any type of provocative discography can be done with Plavix is 5 days from the last dose which would be 05/17/15 Appreciate cardiology input-IV cangrelor to start if P2Y12  above 208 so no current indication at this stage Plavix was last taken 6/24 a.m. plt count 194 05/17/15 Continue medications for neuropathy including gabapentin 300 daily at bedtime-->twice a day, Cymbalta 60 daily   Added Percocet 5/325 on 6/26 We will continue Dilaudid 1 mg every 3 when necessary  Active Problems:   CAD (coronary artery disease)-patient with recent in-stent thrombosis status post DES placement 02/2015- appreciate cardiology guidance regarding Plavix/aspirin usage. Continue Coreg 25 twice a day., Continue lisinopril/HCTZ twice a day, continue amlodipine 5 daily-->10 daily 6/26 given uncontrolled blood pressure Improved to some extent    Hypertension-see above   Hyperlipidemia-I will change Zocor 2 high intensity dosing Lipitor given recent stent thrombosis   DM (diabetes mellitus), type 2 with neurological complications-he sees Dr. Cruzita Lederer of endocrinology who usually has this patient on U500 20 units 3 times a day----> n6/26-38 units 3 times a day - uncontrolled because of IV Decadron that has been given in terms of concern for acute cord compression. 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

## 2015-05-17 NOTE — Progress Notes (Signed)
OT Cancellation Note  Patient Details Name: Steve Phillips MRN: [id number] DOB: 1946-02-03   Cancelled Treatment:    Reason Eval/Treat Not Completed: Other (comment) Pt just had myelogram performed and LP L3-L4. Unsure if pt on bedrest/spoke with nurse. Will plan to evaluate tomorrow.   Benito Mccreedy OTR/L [phone] 05/17/2015, 2:47 PM

## 2015-05-17 NOTE — Evaluation (Signed)
Physical Therapy Evaluation Patient Details Name: Steve Phillips MRN: [id number] DOB: 07/08/1946 Today's Date: 05/17/2015   History of Present Illness  Pt is a 69 y/o male with a PMH significant for Parkinson's Disease, CAD s/p recent coronary stent occlusion. Pt is also s/p DES resenting of LAD 4/16. Pt presents with severe LBP and progressive symptoms concerning for cord compression.   Clinical Impression  Pt admitted with above diagnosis. Pt currently with functional limitations due to the deficits listed below (see PT Problem List). At the time of PT eval pt was able to perform transfers and minimal ambulation with assist for balance, support and general safety. Recommended use of RW all the time. Pt and wife report several falls PTA and state that ambulation and pain continues to get progressively worse with time. Pt has been experiencing incontinence as well. Pt will benefit from skilled PT to increase their independence and safety with mobility to allow discharge to the venue listed below. Recommending a CIR consult to maximize independence and decrease burden of care prior to return home. Pt is motivated to participate in therapy.     Follow Up Recommendations CIR;Supervision/Assistance - 24 hour    Equipment Recommendations  None recommended by PT    Recommendations for Other Services Rehab consult     Precautions / Restrictions Precautions Precautions: Fall Precaution Comments: Wife reports several falls at home PTA Restrictions Weight Bearing Restrictions: No      Mobility  Bed Mobility Overal bed mobility: Needs Assistance Bed Mobility: Rolling;Sidelying to Sit Rolling: Min guard Sidelying to sit: Min assist       General bed mobility comments: VC's for log roll technique. Minimal assist at the shoulder for transition to full sitting position.   Transfers Overall transfer level: Needs assistance Equipment used: Rolling walker (2 wheeled) Transfers: Sit to/from  Stand Sit to Stand: Min assist;From elevated surface         General transfer comment: Pt required assist to stand from an elevated bed height. If bed was at lowest setting, it is likely that pt would have required extensive assist to achieve standing.   Ambulation/Gait Ambulation/Gait assistance: Min assist Ambulation Distance (Feet): 5 Feet Assistive device: Rolling walker (2 wheeled) Gait Pattern/deviations: Step-to pattern;Decreased stride length;Shuffle;Narrow base of support Gait velocity: Decreased Gait velocity interpretation: Below normal speed for age/gender General Gait Details: Steadying assist required for safety. Pt was able to ambulate a short distance from bed to chair. Increased time required to stand prior to initiating gait training as BLE's appeared unsteady, and pt reports increased pain.   Stairs            Wheelchair Mobility    Modified Rankin (Stroke Patients Only)       Balance Overall balance assessment: Needs assistance;History of Falls Sitting-balance support: Feet supported;No upper extremity supported Sitting balance-Leahy Scale: Fair     Standing balance support: Bilateral upper extremity supported;During functional activity Standing balance-Leahy Scale: Poor                               Pertinent Vitals/Pain Pain Assessment: 0-10 Pain Score: 7  Pain Location: LLE hip to feet laterally Pain Descriptors / Indicators: Aching Pain Intervention(s): Limited activity within patient's tolerance;Monitored during session;Repositioned    Home Living Family/patient expects to be discharged to:: Skilled nursing facility Living Arrangements: Spouse/significant other Available Help at Discharge: Family;Available 24 hours/day Type of Home: House Home Access: Ramped entrance  Home Layout: One level Home Equipment: Walker - 2 wheels;Cane - single point;Shower seat;Toilet riser;Grab bars - toilet;Wheelchair - manual      Prior  Function Level of Independence: Needs assistance   Gait / Transfers Assistance Needed: Using the RW all the time  ADL's / Homemaking Assistance Needed: Wife assisting with LB dressing and back/LB washing        Hand Dominance   Dominant Hand: Right    Extremity/Trunk Assessment   Upper Extremity Assessment: Defer to OT evaluation           Lower Extremity Assessment: RLE deficits/detail;LLE deficits/detail RLE Deficits / Details: Peripheral neuropathy whole foot and distal 2/3 of lower leg. Pt with no sensation to painful stimulus on R foot. Pt reports increased sensation laterally and posteriorly however overall decreased from normal. Grossly, pt reports sensation in RLE>LLE. LLE Deficits / Details: Peripheral neuropathy whole foot and distal 2/3 of lower leg. Pt with no sensation to painful stimulus on L foot. Pt reports increased sensation laterally and posteriorly however overall decreased from normal. Grossly, pt reports sensation in RLE>LLE.  Cervical / Trunk Assessment: Kyphotic  Communication   Communication: No difficulties  Cognition Arousal/Alertness: Awake/alert Behavior During Therapy: WFL for tasks assessed/performed Overall Cognitive Status: Within Functional Limits for tasks assessed                      General Comments      Exercises        Assessment/Plan    PT Assessment Patient needs continued PT services  PT Diagnosis Difficulty walking;Abnormality of gait;Generalized weakness;Acute pain   PT Problem List Decreased strength;Decreased range of motion;Decreased activity tolerance;Decreased balance;Decreased mobility;Decreased knowledge of use of DME;Decreased safety awareness;Decreased knowledge of precautions;Pain  PT Treatment Interventions DME instruction;Gait training;Functional mobility training;Therapeutic activities;Therapeutic exercise;Balance training;Neuromuscular re-education;Patient/family education;Wheelchair mobility training    PT Goals (Current goals can be found in the Care Plan section) Acute Rehab PT Goals Patient Stated Goal: get stronger PT Goal Formulation: With patient/family Time For Goal Achievement: 05/31/15 Potential to Achieve Goals: Good    Frequency Min 3X/week   Barriers to discharge Decreased caregiver support Wife has shoulder injury so cannot lift patient    Co-evaluation               End of Session Equipment Utilized During Treatment: Gait belt Activity Tolerance: Patient limited by fatigue;No increased pain Patient left: in chair;with call bell/phone within reach;with nursing/sitter in room Nurse Communication: Mobility status         Time: 1100-1138 PT Time Calculation (min) (ACUTE ONLY): 38 min   Charges:   PT Evaluation $Initial PT Evaluation Tier I: 1 Procedure PT Treatments $Gait Training: 8-22 mins $Therapeutic Activity: 8-22 mins   PT G Codes:        Rolinda Roan 28-May-2015, 12:09 PM   Rolinda Roan, PT, DPT Acute Rehabilitation Services Pager: [phone]

## 2015-05-17 NOTE — Progress Notes (Signed)
Pt back to unit from CT. Francis Gaines Stanton Kissoon RN.

## 2015-05-17 NOTE — Progress Notes (Signed)
Pt telemetry discontinue per order; Pt transported off unit to CT for a procedure. Francis Gaines Davide Risdon RN.

## 2015-05-18 ENCOUNTER — Other Ambulatory Visit: Payer: Medicare Other

## 2015-05-18 ENCOUNTER — Ambulatory Visit: Payer: Medicare Other

## 2015-05-18 DIAGNOSIS — E1140 Type 2 diabetes mellitus with diabetic neuropathy, unspecified: Secondary | ICD-10-CM

## 2015-05-18 DIAGNOSIS — M545 Low back pain: Secondary | ICD-10-CM

## 2015-05-18 DIAGNOSIS — M5410 Radiculopathy, site unspecified: Secondary | ICD-10-CM

## 2015-05-18 LAB — COMPREHENSIVE METABOLIC PANEL
ALBUMIN: 3.2 g/dL — AB (ref 3.5–5.0)
ALK PHOS: 71 U/L (ref 38–126)
ALT: 6 U/L — AB (ref 17–63)
AST: 30 U/L (ref 15–41)
Anion gap: 9 (ref 5–15)
BUN: 27 mg/dL — ABNORMAL HIGH (ref 6–20)
CO2: 26 mmol/L (ref 22–32)
Calcium: 8.6 mg/dL — ABNORMAL LOW (ref 8.9–10.3)
Chloride: 101 mmol/L (ref 101–111)
Creatinine, Ser: 1.25 mg/dL — ABNORMAL HIGH (ref 0.61–1.24)
GFR calc Af Amer: >60 mL/min (ref >60)
GFR calc non Af Amer: 57 mL/min — ABNORMAL LOW (ref >60)
GLUCOSE: 194 mg/dL — AB (ref 65–99)
POTASSIUM: 4.3 mmol/L (ref 3.5–5.1)
SODIUM: 136 mmol/L (ref 135–145)
TOTAL PROTEIN: 5.6 g/dL — AB (ref 6.5–8.1)
Total Bilirubin: 0.8 mg/dL (ref 0.3–1.2)

## 2015-05-18 LAB — CBC
HCT: 41 % (ref 39.0–52.0)
Hemoglobin: 14 g/dL (ref 13.0–17.0)
MCH: 26.5 pg (ref 26.0–34.0)
MCHC: 34.1 g/dL (ref 30.0–36.0)
MCV: 77.7 fL — ABNORMAL LOW (ref 78.0–100.0)
Platelets: 224 10*3/uL (ref 150–400)
RBC: 5.28 MIL/uL (ref 4.22–5.81)
RDW: 18.7 % — AB (ref 11.5–15.5)
WBC: 12.1 10*3/uL — ABNORMAL HIGH (ref 4.0–10.5)

## 2015-05-18 LAB — GLUCOSE, CAPILLARY
GLUCOSE-CAPILLARY: 190 mg/dL — AB (ref 65–99)
GLUCOSE-CAPILLARY: 400 mg/dL — AB (ref 65–99)
Glucose-Capillary: 289 mg/dL — ABNORMAL HIGH (ref 65–99)
Glucose-Capillary: 261 mg/dL — ABNORMAL HIGH (ref 65–99)

## 2015-05-18 LAB — PLATELET INHIBITION P2Y12: Platelet Function  P2Y12: 114 [PRU] — ABNORMAL LOW (ref 194–418)

## 2015-05-18 MED ORDER — INSULIN ASPART 100 UNIT/ML ~~LOC~~ SOLN
0.0000 [IU] | Freq: Three times a day (TID) | SUBCUTANEOUS | Status: DC
  Administered 2015-05-18: 8 [IU] via SUBCUTANEOUS
  Administered 2015-05-19: 15 [IU] via SUBCUTANEOUS
  Administered 2015-05-19: 8 [IU] via SUBCUTANEOUS

## 2015-05-18 MED ORDER — INSULIN ASPART 100 UNIT/ML ~~LOC~~ SOLN
0.0000 [IU] | Freq: Every day | SUBCUTANEOUS | Status: DC
  Administered 2015-05-18: 3 [IU] via SUBCUTANEOUS

## 2015-05-18 NOTE — Progress Notes (Signed)
[facility] PROGRESS NOTE  Steve Phillips QPR:[id number] DOB: 1946-01-14 DOA: 05/13/2015 PCP: Rica Mast, MD  Assessment/Plan: 69 y/omale with PMH of HTN, CAD s/p DES stent (02/2015), parkinson's disease, presented with back pain, radiating to the R leg and difficulty with ambulation   1. Back pains. DJD. Initially thought possible lumbar cord compression. But imaging is neg.  MRI: Mild lumbar disc and facet degeneration without interval change.Mild left lateral recess stenosis at L4-5 could irritate the left L5 nerve root.  CT myelogram: No evidence of lumbar nerve root compression -Patient is improving, he received steroids IV. Patent was evaluated by neurosurgery who recommended PT/rehab. Pain control.no neurosurgical intervention at this time . We will contuile CIR  2. CAD s/p DES stent (02/2015). Plavix was on hold few few days. We have restarted plavix. No acute chest pains. Cont BB 3. 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 Episodes of hypo/hyperglycemia. Discontinued steroids. Cont insulin regimen. Close monitor  -d/w patient, his wife. Patient reports was taking U 500 of 20-30 units TID with meals. Currently he is on aspart+lantus. Glucose is uncontrolled. D/w pharmacy who kindly agreed to clarify the dose, administration with patient. We will resume insulin U 500 after the dose clarification. Cont close monitor  4. HTN. Resume BP meds. Titrate as needed    Code Status: full Family Communication: d/w patient, his wife  (indicate person spoken with, relationship, and if by phone, the number) Disposition Plan: CIR   Consultants:  Neurosurgery   Cardiology   Procedures:  none  Antibiotics:  none (indicate start date, and stop date if known)  HPI/Subjective: alert  Objective: Filed Vitals:   05/18/15 0527  BP: 143/64  Pulse: 66  Temp: 98.1 F (36.7 C)  Resp: 18    Intake/Output Summary (Last 24 hours) at 05/18/15 1326 Last data filed at 05/18/15 0900  Gross per 24 hour  Intake    360 ml  Output   1100 ml  Net   -740 ml   There were no vitals filed for this visit.  Exam:   General:  No distress   Cardiovascular: s1,s2 rrr  Respiratory: CTA BL  Abdomen: soft, nt,n d  Musculoskeletal: mild pedal edema. Chronic per patient    Data Reviewed: Basic Metabolic Panel:  Recent Labs Lab 05/13/15 1051 05/14/15 0500 05/14/15 1440 05/16/15 0400 05/18/15 0422  NA 137 134* 132* 137 136  K 4.5 4.5 4.6 4.6 4.3  CL 102 100* 98* 100* 101  CO2 25 25 23 29 26   GLUCOSE 237* 336* 514* 307* 194*  BUN 17 16 21* 25* 27*  CREATININE 1.06 1.13 1.45* 1.14 1.25*  CALCIUM 9.2 9.0 8.9 8.9 8.6*   Liver Function Tests:  Recent Labs Lab 05/14/15 0500 05/16/15 0400 05/18/15 0422  AST 21 16 30   ALT 6* 7* 6*  ALKPHOS 55 53 71  BILITOT 0.6 0.4 0.8  PROT 6.1* 5.7* 5.6*  ALBUMIN 3.4* 3.3* 3.2*   No results for input(s): LIPASE, AMYLASE in the last 168 hours. No results for input(s): AMMONIA in the last 168 hours. CBC:  Recent Labs Lab 05/13/15 1051 05/14/15 0500 05/16/15 0400 05/18/15 0422  WBC 8.3 7.1 15.4* 12.1*  NEUTROABS 5.4  --  12.5*  --   HGB 13.8 14.2 13.4 14.0  HCT 40.4 41.4 39.3 41.0  MCV 76.1* 76.8* 77.1* 77.7*  PLT 253 244 262 224   Cardiac Enzymes: No results for input(s): CKTOTAL, CKMB, CKMBINDEX, TROPONINI in the last 168 hours. BNP (last 3 results)  No results for input(s): BNP in the last 8760 hours.  ProBNP (last 3 results) No results for input(s): PROBNP in the last 8760 hours.  CBG:  Recent Labs Lab 05/17/15 1618 05/17/15 2048 05/17/15 2136 05/18/15 0526 05/18/15 1121  GLUCAP 134* 66 90 190* 400*    No results found for this or any previous visit (from the past 240 hour(s)).   Studies: Ct Lumbar Spine W Contrast  05/17/2015   CLINICAL DATA:  69 year old male with left lower extremity radiculopathy. Subsequent encounter.  EXAM: LUMBAR MYELOGRAM  FLUOROSCOPY TIME:  2004.21 micro Gy cm2   PROCEDURE: Per request of Dr. Hal Neer, who felt it was necessary to perform lumbar myelogram today rather than hold Cymbalta for 48 hours. That the risk of holding blood thinner outweighed the small theoretical risk of seizures. This was discussed with the patient who agreed to proceed. After the procedure, Prisilla, the patient's nurse was notified of the potential theoretical risk of seizures and to contact Dr. Hal Neer is seizure was noted.  After thorough discussion of risks and benefits of the procedure including bleeding, infection, injury to nerves, blood vessels, adjacent structures as well as headache and CSF leak, written and oral informed consent was obtained. Consent was obtained by Dr. Genia Del. Time out form was completed.  Patient was positioned prone on the fluoroscopy table. Local anesthesia was provided with 1% lidocaine without epinephrine after prepped and draped in the usual sterile fashion. Puncture was performed at L3-4 using a 22-gauge spinal needle via (left Paramedian) approach. Using a single pass through the dura, the needle was placed within the thecal sac, with return of clear CSF. 9 cc of Omnipaque-180 was injected into the thecal sac, with normal opacification of the nerve roots and cauda equina consistent with free flow within the subarachnoid space.  I personally performed the lumbar puncture and administered the intrathecal contrast. I also personally supervised acquisition of the myelogram images.  TECHNIQUE: Contiguous axial images were obtained through the Lumbar spine after the intrathecal infusion of infusion. Coronal and sagittal reconstructions were obtained of the axial image sets.  COMPARISON:  05/13/2015 MR.  FINDINGS: LUMBAR MYELOGRAM FINDINGS:  No evidence of lumbar nerve root compression.  No abnormal motion occurs between flexion and extension.  Atherosclerotic type changes of the abdominal aorta.  CT LUMBAR MYELOGRAM FINDINGS:  Last fully open disk space is  labeled L5-S1. Present examination incorporates from T11-12 disc space through the lower sacrum.  Conus L1-2 level.  No abnormal vessels from the conus.  Calcified plaque abdominal aorta and iliac arteries incompletely assessed.  Remote mild T12 superior plate compression fracture treated with cement augmentation from right pedicle approach. Cement extends towards the inferior aspect of the disc.  T11-12:  Minimal bulge.  T12-L1:  Negative.  L1-2:  Negative.  L2-3: Minimal osteophyte left lateral position. No spinal stenosis, foraminal narrowing or nerve root compression.  L3-4: Mild bulge greatest left lateral position without nerve root compression. Mild facet joint degenerative changes.  L4-5: Bulge with osteophyte greatest left lateral position. No compression of the exiting nerve root. Mild facet joint degenerative changes.  L5-S1: Facet joint degenerative changes. Minimal bulge. Prominent epidural fat.  Mild bilateral sacroiliac joint degenerative changes.  IMPRESSION: LUMBAR MYELOGRAM IMPRESSION:  No evidence of lumbar nerve root compression.  No abnormal motion occurs between flexion and extension.  CT LUMBAR MYELOGRAM IMPRESSION:  Degenerative changes as detailed above without evidence of significant spinal stenosis, lateral recess stenosis, foraminal narrowing or nerve  root compression.   Electronically Signed   By: Genia Del M.D.   On: 05/17/2015 15:27   Dg Myelography Lumbar Inj Lumbosacral  05/17/2015   CLINICAL DATA:  69 year old male with left lower extremity radiculopathy. Subsequent encounter.  EXAM: LUMBAR MYELOGRAM  FLUOROSCOPY TIME:  2004.21 micro Gy cm2  PROCEDURE: Per request of Dr. Hal Neer, who felt it was necessary to perform lumbar myelogram today rather than hold Cymbalta for 48 hours. That the risk of holding blood thinner outweighed the small theoretical risk of seizures. This was discussed with the patient who agreed to proceed. After the procedure, Prisilla, the patient's  nurse was notified of the potential theoretical risk of seizures and to contact Dr. Hal Neer is seizure was noted.  After thorough discussion of risks and benefits of the procedure including bleeding, infection, injury to nerves, blood vessels, adjacent structures as well as headache and CSF leak, written and oral informed consent was obtained. Consent was obtained by Dr. Genia Del. Time out form was completed.  Patient was positioned prone on the fluoroscopy table. Local anesthesia was provided with 1% lidocaine without epinephrine after prepped and draped in the usual sterile fashion. Puncture was performed at L3-4 using a 22-gauge spinal needle via (left Paramedian) approach. Using a single pass through the dura, the needle was placed within the thecal sac, with return of clear CSF. 9 cc of Omnipaque-180 was injected into the thecal sac, with normal opacification of the nerve roots and cauda equina consistent with free flow within the subarachnoid space.  I personally performed the lumbar puncture and administered the intrathecal contrast. I also personally supervised acquisition of the myelogram images.  TECHNIQUE: Contiguous axial images were obtained through the Lumbar spine after the intrathecal infusion of infusion. Coronal and sagittal reconstructions were obtained of the axial image sets.  COMPARISON:  05/13/2015 MR.  FINDINGS: LUMBAR MYELOGRAM FINDINGS:  No evidence of lumbar nerve root compression.  No abnormal motion occurs between flexion and extension.  Atherosclerotic type changes of the abdominal aorta.  CT LUMBAR MYELOGRAM FINDINGS:  Last fully open disk space is labeled L5-S1. Present examination incorporates from T11-12 disc space through the lower sacrum.  Conus L1-2 level.  No abnormal vessels from the conus.  Calcified plaque abdominal aorta and iliac arteries incompletely assessed.  Remote mild T12 superior plate compression fracture treated with cement augmentation from right pedicle  approach. Cement extends towards the inferior aspect of the disc.  T11-12:  Minimal bulge.  T12-L1:  Negative.  L1-2:  Negative.  L2-3: Minimal osteophyte left lateral position. No spinal stenosis, foraminal narrowing or nerve root compression.  L3-4: Mild bulge greatest left lateral position without nerve root compression. Mild facet joint degenerative changes.  L4-5: Bulge with osteophyte greatest left lateral position. No compression of the exiting nerve root. Mild facet joint degenerative changes.  L5-S1: Facet joint degenerative changes. Minimal bulge. Prominent epidural fat.  Mild bilateral sacroiliac joint degenerative changes.  IMPRESSION: LUMBAR MYELOGRAM IMPRESSION:  No evidence of lumbar nerve root compression.  No abnormal motion occurs between flexion and extension.  CT LUMBAR MYELOGRAM IMPRESSION:  Degenerative changes as detailed above without evidence of significant spinal stenosis, lateral recess stenosis, foraminal narrowing or nerve root compression.   Electronically Signed   By: Genia Del M.D.   On: 05/17/2015 15:27    Scheduled Meds: . amLODipine  10 mg Oral Daily  . atorvastatin  80 mg Oral q1800  . carbidopa-levodopa  1 tablet Oral BID  .  carvedilol  25 mg Oral BID  . clopidogrel  75 mg Oral Daily  . diclofenac sodium  2 g Topical QID  . DULoxetine  60 mg Oral Daily  . gabapentin  300 mg Oral BID  . heparin  5,000 Units Subcutaneous 3 times per day  . lisinopril  20 mg Oral Daily   And  . hydrochlorothiazide  12.5 mg Oral Daily  . insulin aspart  38 Units Subcutaneous TID AC  . insulin glargine  20 Units Subcutaneous Daily  . LORazepam  1 mg Intravenous Once  . ondansetron  4 mg Oral Once  . pantoprazole  40 mg Oral BID  . polyethylene glycol  17 g Oral Daily  . 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 If 7PM-7AM, please contact night-coverage at [email or web address], password [facility] 05/18/2015, 1:26 PM  LOS: 5 days

## 2015-05-18 NOTE — Clinical Social Work Note (Signed)
Clinical Social Work Assessment  Patient Details  Name: Steve Phillips MRN: [id number] Date of Birth: [birth year]  Date of referral:  05/16/15               Reason for consult:  Facility Placement                Permission sought to share information with:  Family Supports Permission granted to share information::  Yes, Verbal Permission Granted  Name::     Sadiel Mota patient's wife and Nahuel Wilbert (patient's son)  Agency::  SNF facilities admissions  Relationship::     Contact Information:     Housing/Transportation Living arrangements for the past 2 months:  Single Family Home Source of Information:  Patient Patient Interpreter Needed:  None Criminal Activity/Legal Involvement Pertinent to Current Situation/Hospitalization:  No - Comment as needed Significant Relationships:  Adult Children, Spouse Lives with:  Spouse Do you feel safe going back to the place where you live?  No (Patient feels like he needs some short term rehab first) Need for family participation in patient care:  Yes (Comment) (Patient requests his family to be involved with decision making)  Care giving concerns:  Patient lives with his wife, but she feels he needs some therapy in order to return back home.   Social Worker assessment / plan:  Patient is a 69 year old male who lives with his wife and is alert and oriented x4.  Patient expressed that he is hopeful the physician will be able to determine what is causing his pain.  Patient stated he is in agreement to going to SNF for short term rehab.  Patient expressed that he did not have any other questions, he is just hoping to continue to get better.  Patient's wife was not at bedside at time of assessment.   Employment status:  Retired Forensic scientist:  Medicare PT Recommendations:  [place], [place] / Referral to community resources:     Patient/Family's Response to care:  Patient and family are agreeable to  going to SNF if patient is not accepted into inpatient rehab.  Patient/Family's Understanding of and Emotional Response to Diagnosis, Current Treatment, and Prognosis:  Patient and family are aware of patient's diagnosis, they just want the physicians to figure out what is causing all of his pain.  Emotional Assessment Appearance:  Appears stated age Attitude/Demeanor/Rapport:    Affect (typically observed):  Accepting, Calm, Stable, Pleasant Orientation:  Oriented to Self, Oriented to Place, Oriented to  Time, Oriented to Situation Alcohol / Substance use:  Not Applicable Psych involvement (Current and /or in the community):  No (Comment)  Discharge Needs  Concerns to be addressed:  No discharge needs identified Readmission within the last 30 days:  Yes (June 20th 2016, patient discharged to home with home health.) Current discharge risk:  Other (Patient lives with his wife, but needs some assistance and strengthening in order to return home safely.) Barriers to Discharge:  No Barriers Identified   Anell Barr 05/18/2015, 10:48 AM

## 2015-05-18 NOTE — Progress Notes (Signed)
Inpatient Diabetes Program Recommendations  AACE/ADA: New Consensus Statement on Inpatient Glycemic Control (2013)  Target Ranges:  Prepandial:   less than 140 mg/dL      Peak postprandial:   less than 180 mg/dL (1-2 hours)      Critically ill patients:  140 - 180 mg/dL   Results for Steve Phillips, Steve Phillips (MRN [id number]) as of 05/18/2015 09:28  Ref. Range 05/16/2015 21:14 05/17/2015 06:04 05/17/2015 11:13 05/17/2015 16:18 05/17/2015 20:48 05/17/2015 21:36 05/18/2015 05:26  Glucose-Capillary Latest Ref Range: 65-99 mg/dL 210 (H) 212 (H) 254 (H) 134 (H) 66 90 190 (H)    Diabetes history: DM2 Outpatient Diabetes medications: Humulin R U500 20-30 units TID with meals, Metformin 1000 mg BID Current orders for Inpatient glycemic control: Latnus 20 units daily, Novolog 38 units TID with meals  Inpatient Diabetes Program Recommendations Correction (SSI): Please consider ordering Novolog correction scale ACHS (in addition to Novolog meal coverage).   Thanks, Barnie Alderman, RN, MSN, CCRN, CDE Diabetes Coordinator Inpatient Diabetes Program [phone] (Team Pager from [place] to [place]) [phone] ([place] office) [phone] [facility] office) [phone] [facility] office)

## 2015-05-18 NOTE — Progress Notes (Signed)
Patient Profile: 70 y/o male with h/o CAD s/p recent coronary stent occlusion. S/p DES resenting of LAD 4/16, on ASA + Plavix. Also with Parkinson's Disease and admitted for severe LBP with progressive neurogenic symptoms. Neurosurgery consulted and no evidence of cord compression on myelogram.    Subjective: Still with some back and leg pain. Denies CP and dyspnea.    Objective: Vital signs in last 24 hours: Temp:  [98.1 F (36.7 C)-98.3 F (36.8 C)] 98.1 F (36.7 C) (06/29 0527) Pulse Rate:  [54-66] 66 (06/29 0527) Resp:  [17-18] 18 (06/29 0527) BP: (118-143)/(51-65) 143/64 mmHg (06/29 0527) SpO2:  [97 %] 97 % (06/29 0527) Last BM Date: 05/17/15  Intake/Output from previous day: 06/28 0701 - 06/29 0700 In: 360 [P.O.:360] Out: 1575 [Urine:1575] Intake/Output this shift: Total I/O In: 240 [P.O.:240] Out: -   Medications Current Facility-Administered Medications  Medication Dose Route Frequency Provider Last Rate Last Dose  . amLODipine (NORVASC) tablet 10 mg  10 mg Oral Daily Nita Sells, MD   10 mg at 05/18/15 1127  . atorvastatin (LIPITOR) tablet 80 mg  80 mg Oral q1800 Nita Sells, MD   80 mg at 05/17/15 1718  . carbidopa-levodopa (SINEMET IR) 25-100 MG per tablet immediate release 1 tablet  1 tablet Oral BID Nita Sells, MD   1 tablet at 05/18/15 1127  . carvedilol (COREG) tablet 25 mg  25 mg Oral BID Nita Sells, MD   25 mg at 05/18/15 1126  . clopidogrel (PLAVIX) tablet 75 mg  75 mg Oral Daily Karie Chimera, MD   75 mg at 05/18/15 1127  . diclofenac sodium (VOLTAREN) 1 % transdermal gel 2 g  2 g Topical QID Nita Sells, MD   2 g at 05/18/15 1127  . DULoxetine (CYMBALTA) DR capsule 60 mg  60 mg Oral Daily Nita Sells, MD   60 mg at 05/18/15 1126  . gabapentin (NEURONTIN) capsule 300 mg  300 mg Oral BID Nita Sells, MD   300 mg at 05/18/15 1126  . heparin injection 5,000 Units  5,000 Units Subcutaneous 3 times  per day Nita Sells, MD   5,000 Units at 05/18/15 2536  . lisinopril (PRINIVIL,ZESTRIL) tablet 20 mg  20 mg Oral Daily Wynell Balloon, RPH   20 mg at 05/18/15 1125   And  . hydrochlorothiazide (MICROZIDE) capsule 12.5 mg  12.5 mg Oral Daily Wynell Balloon, RPH   12.5 mg at 05/18/15 1125  . HYDROmorphone (DILAUDID) injection 1 mg  1 mg Intravenous Q3H PRN Nita Sells, MD   1 mg at 05/17/15 2024  . insulin aspart (novoLOG) injection 38 Units  38 Units Subcutaneous TID AC Nita Sells, MD   38 Units at 05/18/15 1212  . insulin glargine (LANTUS) injection 20 Units  20 Units Subcutaneous Daily Nita Sells, MD   20 Units at 05/18/15 1128  . LORazepam (ATIVAN) injection 1 mg  1 mg Intravenous Once Nita Sells, MD      . ondansetron (ZOFRAN-ODT) disintegrating tablet 4 mg  4 mg Oral Once Nita Sells, MD      . oxyCODONE-acetaminophen (PERCOCET/ROXICET) 5-325 MG per tablet 1 tablet  1 tablet Oral Q4H PRN Nita Sells, MD   1 tablet at 05/18/15 6440   And  . oxyCODONE (Oxy IR/ROXICODONE) immediate release tablet 5 mg  5 mg Oral Q4H PRN Nita Sells, MD   5 mg at 05/18/15 3474  . pantoprazole (PROTONIX) EC tablet 40 mg  40 mg Oral BID Jai-Gurmukh  Samtani, MD   40 mg at 05/18/15 1126  . polyethylene glycol (MIRALAX / GLYCOLAX) packet 17 g  17 g Oral Daily Nita Sells, MD   17 g at 05/18/15 1127  . promethazine (PHENERGAN) tablet 12.5 mg  12.5 mg Oral Q6H PRN Nita Sells, MD   12.5 mg at 05/16/15 1337  . senna (SENOKOT) tablet 8.6 mg  1 tablet Oral Daily Nita Sells, MD   8.6 mg at 05/18/15 1125  . sorbitol 70 % solution 40 mL  40 mL Oral Daily PRN Nita Sells, MD        PE: General appearance: alert, cooperative and no distress Neck: no carotid bruit and no JVD Lungs: clear to auscultation bilaterally Heart: regular rate and rhythm, S1, S2 normal, no murmur, click, rub or gallop Extremities: no LEE Pulses:  2+ and symmetric Skin: warm and dry Neurologic: Grossly normal  Lab Results:   Recent Labs  05/16/15 0400 05/18/15 0422  WBC 15.4* 12.1*  HGB 13.4 14.0  HCT 39.3 41.0  PLT 262 224   BMET  Recent Labs  05/16/15 0400 05/18/15 0422  NA 137 136  K 4.6 4.3  CL 100* 101  CO2 29 26  GLUCOSE 307* 194*  BUN 25* 27*  CREATININE 1.14 1.25*  CALCIUM 8.9 8.6*   PT/INR No results for input(s): LABPROT, INR in the last 72 hours.   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 CAD: s/p recent restenting of LAD 2/2 stent occlusion 4/16. Now NO PLAN for surgery. NO lumbar cord compression. Plavix has been restarted. reloaded on plavix 300 mg and resume 75 mg daily. Continue BB and statin.   2. HTN:  Moderately elevated. Amlodipine increased yesterday to 10 mg. Continue ACE-I/diuretic and BB.   3. HLD: continue statin therapy.    LOS: 5 days   Will sign off.   Candee Furbish, MD

## 2015-05-18 NOTE — Evaluation (Signed)
Occupational Therapy Evaluation Patient Details Name: Steve Phillips MRN: [id number] DOB: 10-19-1946 Today's Date: 05/18/2015    History of Present Illness Pt is a 69 y.o. male with a PMH significant for Parkinson's Disease, CAD s/p recent coronary stent occlusion. Pt is also s/p DES resenting of LAD 4/16. Pt presents with severe LBP and progressive symptoms concerning for cord compression.    Clinical Impression   Pt admitted with above. Pt requiring assist with ADLs, PTA. Feel pt will benefit from acute OT to increase independence prior to d/c. Recommending CIR for rehab.    Follow Up Recommendations  CIR    Equipment Recommendations  Other (comment) (defer to next venue)    Recommendations for Other Services       Precautions / Restrictions Precautions Precautions: Fall Precaution Comments: Wife reports several falls at home PTA Restrictions Weight Bearing Restrictions: No      Mobility Bed Mobility               General bed mobility comments: not assessed  Transfers Overall transfer level: Needs assistance   Transfers: Sit to/from Stand Sit to Stand: Min assist         General transfer comment: assist to boost.     Balance  History of falls; Min guard for ambulation with RW.                                          ADL Overall ADL's : Needs assistance/impaired                 Upper Body Dressing : Set up;Sitting   Lower Body Dressing: Minimal assistance;With adaptive equipment;Sit to/from stand   Toilet Transfer: Min guard;Ambulation;RW;Minimal assistance (chair; assist to boost for sit to stand)           Functional mobility during ADLs: Min guard;Rolling walker General ADL Comments: Educated on AE/cost/where they could purchase and pt practiced. Educated on LB dressing technique. Explained benefit of moving/walking. Briefly discussed CIR. Educated on energy conservation.     Vision     Perception     Praxis       Pertinent Vitals/Pain Pain Assessment: 0-10 Pain Score: 6  Pain Location: left leg Pain Descriptors / Indicators: Constant;Aching Pain Intervention(s): Monitored during session;Repositioned     Hand Dominance Right   Extremity/Trunk Assessment Upper Extremity Assessment Upper Extremity Assessment: Generalized weakness   Lower Extremity Assessment Lower Extremity Assessment: Defer to PT evaluation       Communication Communication Communication: No difficulties   Cognition Arousal/Alertness: Awake/alert Behavior During Therapy: WFL for tasks assessed/performed Overall Cognitive Status: Within Functional Limits for tasks assessed                     General Comments       Exercises       Shoulder Instructions      Home Living Family/patient expects to be discharged to:: Inpatient rehab Living Arrangements: Spouse/significant other Available Help at Discharge: Family;Available 24 hours/day Type of Home: House Home Access: Ramped entrance     Home Layout: One level               Home Equipment: Walker - 2 wheels;Cane - single point;Shower seat;Toilet riser;Grab bars - toilet;Wheelchair - manual          Prior Functioning/Environment Level of Independence: Needs assistance  ADL's / Homemaking Assistance Needed: assistance with bathing/dressing        OT Diagnosis: Generalized weakness;Acute pain   OT Problem List: Decreased strength;Decreased range of motion;Pain;Increased edema;Decreased knowledge of precautions;Decreased knowledge of use of DME or AE;Decreased activity tolerance   OT Treatment/Interventions: Self-care/ADL training;Therapeutic exercise;Energy conservation;DME and/or AE instruction;Therapeutic activities;Patient/family education;Balance training    OT Goals(Current goals can be found in the care plan section) Acute Rehab OT Goals Patient Stated Goal: go to rehab; to walk OT Goal Formulation: With patient Time For  Goal Achievement: 05/25/15 Potential to Achieve Goals: Good ADL Goals Pt Will Perform Lower Body Bathing: with set-up;with supervision;with adaptive equipment;sit to/from stand Pt Will Perform Lower Body Dressing: with set-up;with supervision;sit to/from stand;with adaptive equipment Pt Will Transfer to Toilet: with supervision;ambulating Pt Will Perform Toileting - Clothing Manipulation and hygiene: with supervision;sit to/from stand  OT Frequency: Min 2X/week   Barriers to D/C:            Co-evaluation              End of Session Equipment Utilized During Treatment: Gait belt;Rolling walker;Other (comment) (AE)  Activity Tolerance: Patient tolerated treatment well Patient left: in chair;with family/visitor present;with call bell/phone within reach   Time: 1005-1022 OT Time Calculation (min): 17 min Charges:  OT General Charges $OT Visit: 1 Procedure OT Evaluation $Initial OT Evaluation Tier I: 1 Procedure G-CodesBenito Mccreedy OTR/L [phone] 05/18/2015, 12:15 PM

## 2015-05-18 NOTE — Consult Note (Signed)
Physical Medicine and Rehabilitation Consult Reason for Consult: Debilitation/Parkinson's disease/multi-medical Referring Physician: [facility]   HPI: Steve Phillips is a 69 y.o. right handed male with history of CAD with stenting April 2016, diabetes mellitus with peripheral neuropathy, Parkinson's disease followed at [facility] by Dr. Manuella Ghazi, chronic back pain with history of epidural spinal injections. Patient lives with his wife used a walker prior to admission and sedentary. Presented 05/13/2015 with progressive intractable low back pain as well as history of ACDF. Recent admission 05/05/2015 05/09/2015 for intractable back pain with conservative care per neurosurgery Dr. Arnoldo Morale and was discharged to home. CT lumbar spine and myelogram showed no evidence of lumbar nerve root compression. No abnormal motion occurs between flexion and extension. No evidence of significant spinal stenosis, lateral recess stenosis. Patient remains on Plavix for history of CAD. Sinemet ongoing for Parkinson's disease. Subcutaneous heparin for DVT prophylaxis. Hospital course pain management for back pain with conservative care and again films reviewed by neurosurgery. Physical therapy evaluation completed 05/17/2015 with recommendations of physical medicine rehabilitation consult.   Review of Systems  Constitutional: Negative for fever and chills.  Eyes: Negative for blurred vision and double vision.  Respiratory: Positive for shortness of breath.   Gastrointestinal: Positive for constipation.       GERD  Genitourinary: Positive for frequency.  Musculoskeletal: Positive for myalgias, back pain, joint pain, falls and neck pain.  Skin: Negative for rash.  Neurological: Positive for dizziness, weakness and headaches.  Psychiatric/Behavioral: Positive for depression.   Past Medical History  Diagnosis Date  . Shingles   . Hyperlipidemia   . Lower leg pain     chronic ,left leg  . Acute angina   .  Depression   . CAD (coronary artery disease)     s/p multiple PCI with stent RCA,LAD and obtuse marginal,followed @ [facility] on the accord study.., cath 02/2012 90% D1, s/p drug-eluting stent Dr. Fletcher [name]  . Shortness of breath   . Hypertension     dr Jerilynn Mages Audelia Acton     [facility] in [place]  . Bell's palsy   . Left leg pain   . 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 Type II diabetes mellitus   . GERD (gastroesophageal reflux disease)   . Headache(784.0)     "sometimes daily; sometimes weekly" (05/16/2015)  . Stroke 2015    denies residual on 05/16/2015  . Chronic lower back pain     "for 3 months now" (05/16/2015)  . Diabetic peripheral neuropathy     "feet"  . Falls frequently     "in the last month or so" (05/16/2015)  . Vascular parkinsonism dx'd 10/2014   Past Surgical History  Procedure Laterality Date  . Appendectomy    . Tibia fracture surgery Left ~ 2002  . Foot fracture surgery Left ~ 2002    "Jone's fracture"  . Testicle surgery Left 2000's    "put drainage tube in for infection"  . Back surgery    . Anterior cervical decomp/discectomy fusion N/A 04/30/2013    Procedure: ANTERIOR CERVICAL DECOMPRESSION/DISCECTOMY FUSION 2 LEVELS;  Surgeon: Faythe Ghee, MD;  Location: [facility];  Service: Neurosurgery;  Laterality: N/A;  Cervical four-five, Cervical six-seven anterior cervical decompression fusion with trabecular metal cage and plate  . Left heart catheterization with coronary angiogram N/A 02/23/2015    Procedure: LEFT HEART CATHETERIZATION WITH CORONARY ANGIOGRAM;  Surgeon: [name], MD;  Location: [facility] CATH LAB;  Service: Cardiovascular;  Laterality: N/A;  . Cholecystectomy open  1980's  . Cardiac catheterization  02/2012  . Coronary angioplasty with stent placement  ~ 2000-02/2015    "he has a total of 6 stents" (05/16/2015  . Fracture surgery    . Squamous cell carcinoma excision Left 2014    "arm"   Family History  Problem Relation Age of Onset  . Cancer  Mother     BREAST  . Heart disease Mother     STENT  . Cancer Father     THROAT  . Heart disease Sister     STENT; HTN  . Heart attack Brother   . Hypertension Brother   . Hypertension Sister    Social History:  reports that he quit smoking about 43 years ago. His smoking use included Cigarettes. He has a 1.2 pack-year smoking history. He has never used smokeless tobacco. He reports that he does not drink alcohol or use illicit drugs. Allergies:  Allergies  Allergen Reactions  . Codeine Nausea Only    Derivatives -Nausea.  . Fentanyl     nausea  . Tramadol Other (See Comments)    unknown  . Trazodone And Nefazodone Nausea Only   Medications Prior to Admission  Medication Sig Dispense Refill  . acetaminophen (TYLENOL) 325 MG tablet Take 2 tablets (650 mg total) by mouth every 4 (four) hours as needed for headache or mild pain.    Marland Kitchen amLODipine (NORVASC) 5 MG tablet Take 1 tablet (5 mg total) by mouth daily. 90 tablet 3  . aspirin EC 81 MG tablet Take 81 mg by mouth 3 (three) times daily.    . carbidopa-levodopa (SINEMET IR) 25-100 MG per tablet Take 2 tablets by mouth 3 (three) times daily. (Patient taking differently: Take 1 tablet by mouth 3 (three) times daily. ) 270 tablet 2  . carvedilol (COREG) 25 MG tablet TAKE ONE TABLET BY MOUTH TWICE DAILY 180 tablet 1  . clopidogrel (PLAVIX) 75 MG tablet Take 1 tablet (75 mg total) by mouth daily with breakfast. 90 tablet 1  . cyanocobalamin (,VITAMIN B-12,) 1000 MCG/ML injection Inject 1,000 mcg into the muscle every 30 (thirty) days.     . diclofenac sodium (VOLTAREN) 1 % GEL Apply 2 g topically 4 (four) times daily. 2 Tube 3  . docusate sodium (COLACE) 100 MG capsule Take 1 capsule (100 mg total) by mouth 2 (two) times daily. (Patient taking differently: Take 100 mg by mouth daily as needed for mild constipation or moderate constipation. ) 60 capsule 0  . DULoxetine (CYMBALTA) 60 MG capsule Take 1 capsule (60 mg total) by mouth daily.  TAKE 1 CAPSULE DAILY 30 capsule 1  . gabapentin (NEURONTIN) 300 MG capsule Take 300 mg by mouth at bedtime.    Marland Kitchen HYDROcodone-acetaminophen (NORCO/VICODIN) 5-325 MG per tablet Take 1 tablet by mouth every 6 (six) hours as needed for moderate pain.    Marland Kitchen insulin regular human CONCENTRATED (HUMULIN R) 500 UNIT/ML injection Inject 20-30 Units into the skin 3 (three) times daily with meals.    Marland Kitchen lisinopril-hydrochlorothiazide (PRINZIDE,ZESTORETIC) 20-12.5 MG per tablet Take 1 tablet by mouth 2 (two) times daily. 180 tablet 1  . metFORMIN (GLUCOPHAGE) 1000 MG tablet TAKE ONE TABLET BY MOUTH TWICE DAILY 180 tablet 1  . nitroGLYCERIN (NITROSTAT) 0.4 MG SL tablet Place 1 tablet (0.4 mg total) under the tongue every 5 (five) minutes as needed for chest pain. 30 tablet 6  . pantoprazole (PROTONIX) 40 MG tablet Take 1  tablet (40 mg total) by mouth 2 (two) times daily. 180 tablet 3  . polyethylene glycol (MIRALAX / GLYCOLAX) packet Take 17 g by mouth daily. 30 each 0  . pregabalin (LYRICA) 150 MG capsule Take 1 capsule (150 mg total) by mouth every morning. 7 capsule 0  . promethazine (PHENERGAN) 12.5 MG tablet Take 1 tablet (12.5 mg total) by mouth every 6 (six) hours as needed for nausea or vomiting. 30 tablet 0  . senna (SENOKOT) 8.6 MG TABS tablet Take 1 tablet (8.6 mg total) by mouth daily. 120 each 0  . simvastatin (ZOCOR) 20 MG tablet Take 1 tablet (20 mg total) by mouth at bedtime. 90 tablet 3    Home: Home Living Family/patient expects to be discharged to:: Inpatient rehab Living Arrangements: Spouse/significant other Available Help at Discharge: Family, Available 24 hours/day Type of Home: House Home Access: Ramped entrance Home Layout: One level Home Equipment: Environmental consultant - 2 wheels, [employer] - single point, [employer], Toilet riser, Grab bars - toilet, Wheelchair - manual  Functional History: Prior Function Level of Independence: Needs assistance Gait / Transfers Assistance Needed: Using the RW all  the time ADL's / Homemaking Assistance Needed: assistance with bathing/dressing Functional Status:  Mobility: Bed Mobility Overal bed mobility: Needs Assistance Bed Mobility: Rolling, Sidelying to Sit Rolling: Min guard Sidelying to sit: Min assist General bed mobility comments: not assessed Transfers Overall transfer level: Needs assistance Equipment used: Rolling walker (2 wheeled) Transfers: Sit to/from Stand Sit to Stand: Min assist General transfer comment: assist to boost.  Ambulation/Gait Ambulation/Gait assistance: Min assist Ambulation Distance (Feet): 40 Feet Assistive device: Rolling walker (2 wheeled) General Gait Details: In room only. Pt required 2 seated rest breaks and multiple standing rest breaks to achieve full 40 feet. Grossly, pt required steadying assist for safety. Pt appears stiff in LE's with possible pes planus bilaterally which appears to be effecting foot flexibility during gait cycle. Gait Pattern/deviations: Step-to pattern, Decreased stride length, Trunk flexed, Wide base of support Gait velocity: Decreased Gait velocity interpretation: Below normal speed for age/gender    ADL: ADL Overall ADL's : Needs assistance/impaired Upper Body Dressing : Set up, Sitting Lower Body Dressing: Minimal assistance, With adaptive equipment, Sit to/from stand Toilet Transfer: Min guard, Ambulation, RW, Minimal assistance (chair; assist to boost for sit to stand) Functional mobility during ADLs: Min guard, Rolling walker General ADL Comments: Educated on AE/cost/where they could purchase and pt practiced. Educated on LB dressing technique. Explained benefit of moving/walking. Briefly discussed CIR. Educated on energy conservation.  Cognition: Cognition Overall Cognitive Status: Within Functional Limits for tasks assessed Orientation Level: Oriented X4 Cognition Arousal/Alertness: Awake/alert Behavior During Therapy: WFL for tasks assessed/performed Overall  Cognitive Status: Within Functional Limits for tasks assessed  Blood pressure 143/64, pulse 66, temperature 98.1 F (36.7 C), temperature source Oral, resp. rate 18, SpO2 97 %. Physical Exam  Constitutional: He is oriented to person, place, and time.  69 year old right-handed obese male  HENT:  Head: Normocephalic.  Eyes: EOM are normal.  Neck: Normal range of motion. Neck supple. No thyromegaly present.  Cardiovascular: Normal rate and regular rhythm.   Respiratory: Effort normal and breath sounds normal. No respiratory distress.  GI: Soft. Bowel sounds are normal. He exhibits no distension.  Neurological: He is alert and oriented to person, place, and time.  Speech is fluent. Follows commands. Good awareness of deficits  Skin: Skin is warm and dry.    Results for orders placed or performed during the hospital  encounter of 05/13/15 (from the past 24 hour(s))  Glucose, capillary     Status: Abnormal   Collection Time: 05/17/15  4:18 PM  Result Value Ref Range   Glucose-Capillary 134 (H) 65 - 99 mg/dL  Glucose, capillary     Status: None   Collection Time: 05/17/15  8:48 PM  Result Value Ref Range   Glucose-Capillary 66 65 - 99 mg/dL   Comment 1 Notify RN    Comment 2 Document in Chart   Glucose, capillary     Status: None   Collection Time: 05/17/15  9:36 PM  Result Value Ref Range   Glucose-Capillary 90 65 - 99 mg/dL   Comment 1 Notify RN    Comment 2 Document in Chart   Platelet inhibition p2y12     Status: Abnormal   Collection Time: 05/18/15  4:22 AM  Result Value Ref Range   Platelet Function  P2Y12 114 (L) 194 - 418 PRU  Comprehensive metabolic panel     Status: Abnormal   Collection Time: 05/18/15  4:22 AM  Result Value Ref Range   Sodium 136 135 - 145 mmol/L   Potassium 4.3 3.5 - 5.1 mmol/L   Chloride 101 101 - 111 mmol/L   CO2 26 22 - 32 mmol/L   Glucose, Bld 194 (H) 65 - 99 mg/dL   BUN 27 (H) 6 - 20 mg/dL   Creatinine, Ser 1.25 (H) 0.61 - 1.24 mg/dL    Calcium 8.6 (L) 8.9 - 10.3 mg/dL   Total Protein 5.6 (L) 6.5 - 8.1 g/dL   Albumin 3.2 (L) 3.5 - 5.0 g/dL   AST 30 15 - 41 U/L   ALT 6 (L) 17 - 63 U/L   Alkaline Phosphatase 71 38 - 126 U/L   Total Bilirubin 0.8 0.3 - 1.2 mg/dL   GFR calc non Af Amer 57 (L) >60 mL/min   GFR calc Af Amer >60 >60 mL/min   Anion gap 9 5 - 15  CBC     Status: Abnormal   Collection Time: 05/18/15  4:22 AM  Result Value Ref Range   WBC 12.1 (H) 4.0 - 10.5 K/uL   RBC 5.28 4.22 - 5.81 MIL/uL   Hemoglobin 14.0 13.0 - 17.0 g/dL   HCT 41.0 39.0 - 52.0 %   MCV 77.7 (L) 78.0 - 100.0 fL   MCH 26.5 26.0 - 34.0 pg   MCHC 34.1 30.0 - 36.0 g/dL   RDW 18.7 (H) 11.5 - 15.5 %   Platelets 224 150 - 400 K/uL  Glucose, capillary     Status: Abnormal   Collection Time: 05/18/15  5:26 AM  Result Value Ref Range   Glucose-Capillary 190 (H) 65 - 99 mg/dL   Comment 1 Notify RN    Comment 2 Document in Chart   Glucose, capillary     Status: Abnormal   Collection Time: 05/18/15 11:21 AM  Result Value Ref Range   Glucose-Capillary 400 (H) 65 - 99 mg/dL   Comment 1 Notify RN    Comment 2 Document in Chart    Ct Lumbar Spine W Contrast  05/17/2015   CLINICAL DATA:  69 year old male with left lower extremity radiculopathy. Subsequent encounter.  EXAM: LUMBAR MYELOGRAM  FLUOROSCOPY TIME:  2004.21 micro Gy cm2  PROCEDURE: Per request of Dr. Hal Neer, who felt it was necessary to perform lumbar myelogram today rather than hold Cymbalta for 48 hours. That the risk of holding blood thinner outweighed the small theoretical risk  of seizures. This was discussed with the patient who agreed to proceed. After the procedure, Prisilla, the patient's nurse was notified of the potential theoretical risk of seizures and to contact Dr. Hal Neer is seizure was noted.  After thorough discussion of risks and benefits of the procedure including bleeding, infection, injury to nerves, blood vessels, adjacent structures as well as headache and CSF leak,  written and oral informed consent was obtained. Consent was obtained by Dr. Genia Del. Time out form was completed.  Patient was positioned prone on the fluoroscopy table. Local anesthesia was provided with 1% lidocaine without epinephrine after prepped and draped in the usual sterile fashion. Puncture was performed at L3-4 using a 22-gauge spinal needle via (left Paramedian) approach. Using a single pass through the dura, the needle was placed within the thecal sac, with return of clear CSF. 9 cc of Omnipaque-180 was injected into the thecal sac, with normal opacification of the nerve roots and cauda equina consistent with free flow within the subarachnoid space.  I personally performed the lumbar puncture and administered the intrathecal contrast. I also personally supervised acquisition of the myelogram images.  TECHNIQUE: Contiguous axial images were obtained through the Lumbar spine after the intrathecal infusion of infusion. Coronal and sagittal reconstructions were obtained of the axial image sets.  COMPARISON:  05/13/2015 MR.  FINDINGS: LUMBAR MYELOGRAM FINDINGS:  No evidence of lumbar nerve root compression.  No abnormal motion occurs between flexion and extension.  Atherosclerotic type changes of the abdominal aorta.  CT LUMBAR MYELOGRAM FINDINGS:  Last fully open disk space is labeled L5-S1. Present examination incorporates from T11-12 disc space through the lower sacrum.  Conus L1-2 level.  No abnormal vessels from the conus.  Calcified plaque abdominal aorta and iliac arteries incompletely assessed.  Remote mild T12 superior plate compression fracture treated with cement augmentation from right pedicle approach. Cement extends towards the inferior aspect of the disc.  T11-12:  Minimal bulge.  T12-L1:  Negative.  L1-2:  Negative.  L2-3: Minimal osteophyte left lateral position. No spinal stenosis, foraminal narrowing or nerve root compression.  L3-4: Mild bulge greatest left lateral position without  nerve root compression. Mild facet joint degenerative changes.  L4-5: Bulge with osteophyte greatest left lateral position. No compression of the exiting nerve root. Mild facet joint degenerative changes.  L5-S1: Facet joint degenerative changes. Minimal bulge. Prominent epidural fat.  Mild bilateral sacroiliac joint degenerative changes.  IMPRESSION: LUMBAR MYELOGRAM IMPRESSION:  No evidence of lumbar nerve root compression.  No abnormal motion occurs between flexion and extension.  CT LUMBAR MYELOGRAM IMPRESSION:  Degenerative changes as detailed above without evidence of significant spinal stenosis, lateral recess stenosis, foraminal narrowing or nerve root compression.   Electronically Signed   By: Genia Del M.D.   On: 05/17/2015 15:27   Dg Myelography Lumbar Inj Lumbosacral  05/17/2015   CLINICAL DATA:  69 year old male with left lower extremity radiculopathy. Subsequent encounter.  EXAM: LUMBAR MYELOGRAM  FLUOROSCOPY TIME:  2004.21 micro Gy cm2  PROCEDURE: Per request of Dr. Hal Neer, who felt it was necessary to perform lumbar myelogram today rather than hold Cymbalta for 48 hours. That the risk of holding blood thinner outweighed the small theoretical risk of seizures. This was discussed with the patient who agreed to proceed. After the procedure, Prisilla, the patient's nurse was notified of the potential theoretical risk of seizures and to contact Dr. Hal Neer is seizure was noted.  After thorough discussion of risks and benefits of the procedure  including bleeding, infection, injury to nerves, blood vessels, adjacent structures as well as headache and CSF leak, written and oral informed consent was obtained. Consent was obtained by Dr. Genia Del. Time out form was completed.  Patient was positioned prone on the fluoroscopy table. Local anesthesia was provided with 1% lidocaine without epinephrine after prepped and draped in the usual sterile fashion. Puncture was performed at L3-4 using a  22-gauge spinal needle via (left Paramedian) approach. Using a single pass through the dura, the needle was placed within the thecal sac, with return of clear CSF. 9 cc of Omnipaque-180 was injected into the thecal sac, with normal opacification of the nerve roots and cauda equina consistent with free flow within the subarachnoid space.  I personally performed the lumbar puncture and administered the intrathecal contrast. I also personally supervised acquisition of the myelogram images.  TECHNIQUE: Contiguous axial images were obtained through the Lumbar spine after the intrathecal infusion of infusion. Coronal and sagittal reconstructions were obtained of the axial image sets.  COMPARISON:  05/13/2015 MR.  FINDINGS: LUMBAR MYELOGRAM FINDINGS:  No evidence of lumbar nerve root compression.  No abnormal motion occurs between flexion and extension.  Atherosclerotic type changes of the abdominal aorta.  CT LUMBAR MYELOGRAM FINDINGS:  Last fully open disk space is labeled L5-S1. Present examination incorporates from T11-12 disc space through the lower sacrum.  Conus L1-2 level.  No abnormal vessels from the conus.  Calcified plaque abdominal aorta and iliac arteries incompletely assessed.  Remote mild T12 superior plate compression fracture treated with cement augmentation from right pedicle approach. Cement extends towards the inferior aspect of the disc.  T11-12:  Minimal bulge.  T12-L1:  Negative.  L1-2:  Negative.  L2-3: Minimal osteophyte left lateral position. No spinal stenosis, foraminal narrowing or nerve root compression.  L3-4: Mild bulge greatest left lateral position without nerve root compression. Mild facet joint degenerative changes.  L4-5: Bulge with osteophyte greatest left lateral position. No compression of the exiting nerve root. Mild facet joint degenerative changes.  L5-S1: Facet joint degenerative changes. Minimal bulge. Prominent epidural fat.  Mild bilateral sacroiliac joint degenerative  changes.  IMPRESSION: LUMBAR MYELOGRAM IMPRESSION:  No evidence of lumbar nerve root compression.  No abnormal motion occurs between flexion and extension.  CT LUMBAR MYELOGRAM IMPRESSION:  Degenerative changes as detailed above without evidence of significant spinal stenosis, lateral recess stenosis, foraminal narrowing or nerve root compression.   Electronically Signed   By: Genia Del M.D.   On: 05/17/2015 15:27    Assessment/Plan: Diagnosis: intractable back pain, hx of PD 1. Does the need for close, 24 hr/day medical supervision in concert with the patient's rehab needs make it unreasonable for this patient to be served in a less intensive setting? No 2. Co-Morbidities requiring supervision/potential complications:   3. Due to bowel management and skin/wound care, does the patient require 24 hr/day rehab nursing? No 4. Does the patient require coordinated care of a physician, rehab nurse, PT (1-2 hrs/day, 5 days/week) and OT (1-2 hrs/day, 5 days/week) to address physical and functional deficits in the context of the above medical diagnosis(es)? No Addressing deficits in the following areas: endurance, locomotion and strength 5. Can the patient actively participate in an intensive therapy program of at least 3 hrs of therapy per day at least 5 days per week? No 6. The potential for patient to make measurable gains while on inpatient rehab is fair 7. Anticipated functional outcomes upon discharge from inpatient rehab are n/a  with PT, n/a with OT, n/a with SLP. 8. Estimated rehab length of stay to reach the above functional goals is:   9. Does the patient have adequate social supports and living environment to accommodate these discharge functional goals? N/A 10. Anticipated D/C setting: Other 11. Anticipated post D/C treatments: N/A 12. Overall Rehab/Functional Prognosis: fair  RECOMMENDATIONS: This patient's condition is appropriate for continued rehabilitative care in the following  setting: SNF or HH therapies  Patient has agreed to participate in recommended program. N/A Note that insurance prior authorization may be required for reimbursement for recommended care.  Comment:  No medical necessity for inpatient rehab stay  Meredith Staggers, MD, [facility] 05/18/2015     05/18/2015

## 2015-05-18 NOTE — Progress Notes (Signed)
Physical Therapy Treatment Patient Details Name: Steve Phillips MRN: [id number] DOB: Jul 27, 1946 Today's Date: 05/18/2015    History of Present Illness Pt is a 69 y/o male with a PMH significant for Parkinson's Disease, CAD s/p recent coronary stent occlusion. Pt is also s/p DES resenting of LAD 4/16. Pt presents with severe LBP and progressive symptoms concerning for cord compression.     PT Comments    Pt progressing towards physical therapy goals. Was able to perform transfers with mod assist from the chair (boosted with pillows) and min assist for safety with ambulation. Pt motivated for distance, however pain was increased to a reported 10/10 after gait training, and therapeutic exercise was deferred. Continue to feel that CIR would be a great option for this pt to improve functional independence. Will continue to follow.   Follow Up Recommendations  CIR;Supervision/Assistance - 24 hour     Equipment Recommendations  None recommended by PT    Recommendations for Other Services Rehab consult     Precautions / Restrictions Precautions Precautions: Fall Precaution Comments: Wife reports several falls at home PTA Restrictions Weight Bearing Restrictions: No    Mobility  Bed Mobility               General bed mobility comments: Pt sitting up in recliner upon PT arrival  Transfers Overall transfer level: Needs assistance Equipment used: Rolling walker (2 wheeled) Transfers: Sit to/from Stand Sit to Stand: Mod assist;From elevated surface         General transfer comment: Pt required assist to stand from the chair boosted up with a pillow. Increased time for full stand.   Ambulation/Gait Ambulation/Gait assistance: Min assist Ambulation Distance (Feet): 40 Feet Assistive device: Rolling walker (2 wheeled) Gait Pattern/deviations: Step-to pattern;Decreased stride length;Trunk flexed;Wide base of support Gait velocity: Decreased Gait velocity interpretation: Below  normal speed for age/gender General Gait Details: In room only. Pt required 2 seated rest breaks and multiple standing rest breaks to achieve full 40 feet. Grossly, pt required steadying assist for safety. Pt appears stiff in LE's with possible pes planus bilaterally which appears to be effecting foot flexibility during gait cycle.   Stairs            Wheelchair Mobility    Modified Rankin (Stroke Patients Only)       Balance Overall balance assessment: Needs assistance Sitting-balance support: Feet supported;No upper extremity supported Sitting balance-Leahy Scale: Fair     Standing balance support: Bilateral upper extremity supported;During functional activity Standing balance-Leahy Scale: Poor                      Cognition Arousal/Alertness: Awake/alert Behavior During Therapy: WFL for tasks assessed/performed Overall Cognitive Status: Within Functional Limits for tasks assessed                      Exercises      General Comments        Pertinent Vitals/Pain Pain Assessment: 0-10 Pain Score: 10-Worst pain ever Pain Location: 5 at rest, 7 after first bout of walking, 10 after second bout of walking Pain Descriptors / Indicators: Aching Pain Intervention(s): Limited activity within patient's tolerance;Monitored during session;Repositioned    Home Living                      Prior Function            PT Goals (current goals can now be found in the care  plan section) Acute Rehab PT Goals Patient Stated Goal: Go to rehab PT Goal Formulation: With patient/family Time For Goal Achievement: 05/31/15 Potential to Achieve Goals: Good Progress towards PT goals: Progressing toward goals    Frequency  Min 3X/week    PT Plan Current plan remains appropriate    Co-evaluation             End of Session Equipment Utilized During Treatment: Gait belt Activity Tolerance: Patient limited by fatigue;Patient limited by pain Patient  left: in chair;with call bell/phone within reach;with family/visitor present     Time: 0828-0852 PT Time Calculation (min) (ACUTE ONLY): 24 min  Charges:  $Gait Training: 8-22 mins $Therapeutic Activity: 8-22 mins                    G Codes:      Rolinda Roan 05/23/15, 9:02 AM   Rolinda Roan, PT, DPT Acute Rehabilitation Services Pager: [phone]

## 2015-05-19 DIAGNOSIS — M5136 Other intervertebral disc degeneration, lumbar region: Secondary | ICD-10-CM | POA: Diagnosis not present

## 2015-05-19 DIAGNOSIS — R609 Edema, unspecified: Secondary | ICD-10-CM | POA: Diagnosis not present

## 2015-05-19 DIAGNOSIS — I2510 Atherosclerotic heart disease of native coronary artery without angina pectoris: Secondary | ICD-10-CM | POA: Diagnosis not present

## 2015-05-19 DIAGNOSIS — D519 Vitamin B12 deficiency anemia, unspecified: Secondary | ICD-10-CM | POA: Diagnosis not present

## 2015-05-19 DIAGNOSIS — Z79899 Other long term (current) drug therapy: Secondary | ICD-10-CM | POA: Diagnosis not present

## 2015-05-19 DIAGNOSIS — R29898 Other symptoms and signs involving the musculoskeletal system: Secondary | ICD-10-CM | POA: Diagnosis not present

## 2015-05-19 DIAGNOSIS — M5410 Radiculopathy, site unspecified: Secondary | ICD-10-CM | POA: Diagnosis not present

## 2015-05-19 DIAGNOSIS — R5383 Other fatigue: Secondary | ICD-10-CM | POA: Diagnosis not present

## 2015-05-19 DIAGNOSIS — G629 Polyneuropathy, unspecified: Secondary | ICD-10-CM | POA: Diagnosis not present

## 2015-05-19 DIAGNOSIS — E860 Dehydration: Secondary | ICD-10-CM | POA: Diagnosis not present

## 2015-05-19 DIAGNOSIS — G20 Parkinson's disease: Secondary | ICD-10-CM | POA: Diagnosis not present

## 2015-05-19 DIAGNOSIS — M7541 Impingement syndrome of right shoulder: Secondary | ICD-10-CM | POA: Diagnosis not present

## 2015-05-19 DIAGNOSIS — E1140 Type 2 diabetes mellitus with diabetic neuropathy, unspecified: Secondary | ICD-10-CM | POA: Diagnosis not present

## 2015-05-19 DIAGNOSIS — Z7982 Long term (current) use of aspirin: Secondary | ICD-10-CM | POA: Diagnosis not present

## 2015-05-19 DIAGNOSIS — M545 Low back pain: Secondary | ICD-10-CM | POA: Diagnosis not present

## 2015-05-19 DIAGNOSIS — E785 Hyperlipidemia, unspecified: Secondary | ICD-10-CM | POA: Diagnosis not present

## 2015-05-19 DIAGNOSIS — M6281 Muscle weakness (generalized): Secondary | ICD-10-CM | POA: Diagnosis not present

## 2015-05-19 DIAGNOSIS — E119 Type 2 diabetes mellitus without complications: Secondary | ICD-10-CM | POA: Diagnosis not present

## 2015-05-19 DIAGNOSIS — Z7902 Long term (current) use of antithrombotics/antiplatelets: Secondary | ICD-10-CM | POA: Diagnosis not present

## 2015-05-19 DIAGNOSIS — M5442 Lumbago with sciatica, left side: Secondary | ICD-10-CM | POA: Diagnosis not present

## 2015-05-19 DIAGNOSIS — R531 Weakness: Secondary | ICD-10-CM | POA: Diagnosis not present

## 2015-05-19 DIAGNOSIS — Z87891 Personal history of nicotine dependence: Secondary | ICD-10-CM | POA: Diagnosis not present

## 2015-05-19 DIAGNOSIS — Z955 Presence of coronary angioplasty implant and graft: Secondary | ICD-10-CM | POA: Diagnosis not present

## 2015-05-19 DIAGNOSIS — Z87442 Personal history of urinary calculi: Secondary | ICD-10-CM | POA: Diagnosis not present

## 2015-05-19 DIAGNOSIS — M259 Joint disorder, unspecified: Secondary | ICD-10-CM | POA: Diagnosis not present

## 2015-05-19 DIAGNOSIS — R262 Difficulty in walking, not elsewhere classified: Secondary | ICD-10-CM | POA: Diagnosis not present

## 2015-05-19 DIAGNOSIS — E782 Mixed hyperlipidemia: Secondary | ICD-10-CM | POA: Diagnosis not present

## 2015-05-19 DIAGNOSIS — E1165 Type 2 diabetes mellitus with hyperglycemia: Secondary | ICD-10-CM | POA: Diagnosis not present

## 2015-05-19 DIAGNOSIS — Z515 Encounter for palliative care: Secondary | ICD-10-CM

## 2015-05-19 DIAGNOSIS — I200 Unstable angina: Secondary | ICD-10-CM | POA: Diagnosis not present

## 2015-05-19 DIAGNOSIS — M549 Dorsalgia, unspecified: Secondary | ICD-10-CM | POA: Diagnosis not present

## 2015-05-19 DIAGNOSIS — R112 Nausea with vomiting, unspecified: Secondary | ICD-10-CM | POA: Diagnosis not present

## 2015-05-19 DIAGNOSIS — M4716 Other spondylosis with myelopathy, lumbar region: Secondary | ICD-10-CM | POA: Diagnosis not present

## 2015-05-19 DIAGNOSIS — D509 Iron deficiency anemia, unspecified: Secondary | ICD-10-CM | POA: Diagnosis not present

## 2015-05-19 DIAGNOSIS — I10 Essential (primary) hypertension: Secondary | ICD-10-CM | POA: Diagnosis not present

## 2015-05-19 DIAGNOSIS — Z794 Long term (current) use of insulin: Secondary | ICD-10-CM | POA: Diagnosis not present

## 2015-05-19 DIAGNOSIS — R509 Fever, unspecified: Secondary | ICD-10-CM | POA: Diagnosis not present

## 2015-05-19 DIAGNOSIS — K219 Gastro-esophageal reflux disease without esophagitis: Secondary | ICD-10-CM | POA: Diagnosis not present

## 2015-05-19 DIAGNOSIS — M5416 Radiculopathy, lumbar region: Secondary | ICD-10-CM | POA: Diagnosis not present

## 2015-05-19 LAB — GLUCOSE, CAPILLARY
GLUCOSE-CAPILLARY: 267 mg/dL — AB (ref 65–99)
Glucose-Capillary: 143 mg/dL — ABNORMAL HIGH (ref 65–99)

## 2015-05-19 MED ORDER — AMLODIPINE BESYLATE 10 MG PO TABS: 10.0000 mg | ORAL_TABLET | Freq: Every day | ORAL | Status: DC

## 2015-05-19 MED ORDER — INSULIN ASPART 100 UNIT/ML ~~LOC~~ SOLN: 0.0000 [IU] | Freq: Three times a day (TID) | SUBCUTANEOUS | Status: DC

## 2015-05-19 MED ORDER — INSULIN GLARGINE 100 UNIT/ML ~~LOC~~ SOLN
30.0000 [IU] | Freq: Every day | SUBCUTANEOUS | Status: DC
Start: 2015-05-19 — End: 2015-06-09

## 2015-05-19 MED ORDER — INSULIN ASPART 100 UNIT/ML ~~LOC~~ SOLN: 38.0000 [IU] | Freq: Three times a day (TID) | SUBCUTANEOUS | Status: DC

## 2015-05-19 MED ORDER — INSULIN GLARGINE 100 UNIT/ML ~~LOC~~ SOLN
30.0000 [IU] | Freq: Every day | SUBCUTANEOUS | Status: DC
  Administered 2015-05-19: 30 [IU] via SUBCUTANEOUS
  Filled 2015-05-19: qty 0.3

## 2015-05-19 MED ORDER — OXYCODONE HCL 5 MG PO TABS: 5.0000 mg | ORAL_TABLET | ORAL | Status: DC | PRN

## 2015-05-19 MED ORDER — ASPIRIN EC 81 MG PO TBEC: 81.0000 mg | DELAYED_RELEASE_TABLET | Freq: Every day | ORAL | Status: AC

## 2015-05-19 NOTE — Clinical Social Work Placement (Signed)
   CLINICAL SOCIAL WORK PLACEMENT  NOTE  Date:  05/19/2015  Patient Details  Name: Steve Phillips MRN: [id number] Date of Birth: [birth year]  Clinical Social Work is seeking post-discharge placement for this patient at the [facility] level of care (*CSW will initial, date and re-position this form in  chart as items are completed):  Yes   Patient/family provided with [facility]'s list of facilities offering this level of care within the geographic area requested by the patient (or if unable, by the patient's family).  Yes   Patient/family informed of their freedom to choose among providers that offer the needed level of care, that participate in Medicare, Medicaid or managed care program needed by the patient, have an available bed and are willing to accept the patient.  Yes   Patient/family informed of [facility]'s ownership interest in [facility] and [facility], as well as of the fact that they are under no obligation to receive care at these facilities.  PASRR submitted to EDS on 05/19/15     PASRR number received on       Existing PASRR number confirmed on 05/19/15     FL2 transmitted to all facilities in geographic area requested by pt/family on 05/18/15     FL2 transmitted to all facilities within larger geographic area on       Patient informed that his/her managed care company has contracts with or will negotiate with certain facilities, including the following:        Yes   Patient/family informed of bed offers received.  Patient chooses bed at [facility]     Physician recommends and patient chooses bed at      Patient to be transferred to [facility] on 05/19/15.  Patient to be transferred to facility by [facility]     Patient family notified on 05/19/15 of transfer.  Name of family member notified:  Kingjames Coury patient's wife     PHYSICIAN       Additional Comment:     _______________________________________________ Ross Ludwig, LCSWA 05/19/2015, 1:36 PM

## 2015-05-19 NOTE — Clinical Social Work Note (Signed)
Patient to be d/c'ed today to  [facility].  Patient and family agreeable to plans will transport via ems RN to call report.  Evette Cristal, MSW, [place]

## 2015-05-19 NOTE — Discharge Summary (Addendum)
Physician Discharge Summary  RICH PAPROCKI IWO:[id number] DOB: Mar 30, 1946 DOA: 05/13/2015  PCP: Rica Mast, MD  Admit date: 05/13/2015 Discharge date: 05/19/2015  Time spent: >35 minutes  Recommendations for Outpatient Follow-up:  SNF F/u with neurosurgery in 2-3 weeks to reevaluate as needed  F/u with PCP in 3-7 days after discharge  F/u with endocrinology 1-2 week as needed   Discharge Diagnoses:  Principal Problem:   Lumbar cord compression Active Problems:   CAD (coronary artery disease)   Hypertension   Hyperlipidemia   DM (diabetes mellitus), type 2 with neurological complications   Benign prostatic hyperplasia with urinary obstruction   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 DM  There were no vitals filed for this visit.  History of present illness:  69 y/omale with PMH of HTN, CAD s/p DES stent (02/2015), parkinson's disease, presented with back pain, radiating to the R leg and difficulty with ambulation, back pains. Patient reported chronic back pain since April of this year. Patient had 3 steroidal injections, oral prednisone. Patient noted that the pain continued to get worse and has been unbearable for last several days despite the home pain medications.  -He presented initially to [facility] and had an extensive evalluation on the admission 6/16-->05/09/15 with [facility] seeing him as well as Dr. Arnoldo Morale of [facility] seeing him as well and no intervention was felt needed as there weas no neural cord compression findigns nor acute active pathology. He was unable to be discharged to skilled nursing facility as he only met observation criteria and and actually saw his cardiologist Dr. Fletcher [name] yesterday 6/23-he was having difficulty ambulating   Hospital Course:  1. Back pains and  L leg pain. DJD. Initially thought possible lumbar cord compression. But imaging is neg. MRI: Mild lumbar disc and facet degeneration without interval change.Mild left lateral recess stenosis at L4-5 could irritate the left L5 nerve root.  CT myelogram: No evidence of lumbar nerve root compression -Patient is improving, he received steroids IV. Patent was evaluated by neurosurgery who recommended PT/rehab and pain control but no neurosurgical intervention at this time. Cont PT. Needs SNF. D/w patient. Needs f/u with neurosurgery. They would likely to get second opinion as outpatient   2. CAD s/p DES stent (02/2015). Plavix was on hold few few days due to spinal myelogram plans for possible surgery. We have restarted plavix post CT myelogram. No surgery is planned. No acute chest pains. Cont BB 3. 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 Episodes of hypo/hyperglycemia. Discontinued steroids. Patient reports taking U 500 of 20-30 units TID with meals with episodes of hypoglycemia. But had significant hypoglycemic episodes  -Currently he is on aspart at 38 Units TID+lantus (increased to 30 Units today)+ISS. We will hold metformin due to renal dysfunction.  -Glucose is improving off steroids. cont close monitoring at SNF, adjust insulin regimen as needed.  Recommended to f/u with endocrinologist as outpatient  4. HTN. BP is stable on current regimen   Patient want to f/u with Dr. Christella Scheuermann as outpatient for second opinion    Procedures:  Imaging studies as below  (i.e. Studies not automatically included, echos, thoracentesis, etc; not x-rays)  Consultations:  Neurosurgery    Cardiology   Discharge Exam: Filed Vitals:   05/19/15 0505  BP: 151/67  Pulse: 70  Temp: 97.6 F (36.4 C)  Resp: 18    General: alert, oriented no distress  Cardiovascular: s1,s2 rrr Respiratory: CTA BL  Discharge Instructions  Discharge Instructions    Diet - low sodium heart healthy    Complete by:  As  directed      Diet Carb Modified    Complete by:  As directed      Discharge instructions    Complete by:  As directed   Please follow up with neurosurgery in 1-2 week     Increase activity slowly    Complete by:  As directed             Medication List    STOP taking these medications        HUMULIN R 500 UNIT/ML injection  Generic drug:  insulin regular human CONCENTRATED     HYDROcodone-acetaminophen 5-325 MG per tablet  Commonly known as:  NORCO/VICODIN     metFORMIN 1000 MG tablet  Commonly known as:  GLUCOPHAGE     pregabalin 150 MG capsule  Commonly known as:  LYRICA      TAKE these medications        acetaminophen 325 MG tablet  Commonly known as:  TYLENOL  Take 2 tablets (650 mg total) by mouth every 4 (four) hours as needed for headache or mild pain.     amLODipine 10 MG tablet  Commonly known as:  NORVASC  Take 1 tablet (10 mg total) by mouth daily.     aspirin EC 81 MG tablet  Take 1 tablet (81 mg total) by mouth daily.     carbidopa-levodopa 25-100 MG per tablet  Commonly known as:  SINEMET IR  Take 2 tablets by mouth 3 (three) times daily.     carvedilol 25 MG tablet  Commonly known as:  COREG  TAKE ONE TABLET BY MOUTH TWICE DAILY     clopidogrel 75 MG tablet  Commonly known as:  PLAVIX  Take 1 tablet (75 mg total) by mouth daily with breakfast.     cyanocobalamin 1000 MCG/ML injection  Commonly known as:  (VITAMIN B-12)  Inject 1,000 mcg into the muscle every 30 (thirty) days.     diclofenac sodium 1 % Gel  Commonly known as:  VOLTAREN  Apply 2 g topically 4 (four) times daily.     docusate sodium 100 MG capsule  Commonly known as:  COLACE  Take 1 capsule (100 mg total) by mouth 2 (two) times daily.     DULoxetine 60 MG capsule  Commonly known as:  CYMBALTA  Take 1 capsule (60 mg total) by mouth daily. TAKE 1 CAPSULE DAILY     gabapentin 300 MG capsule  Commonly known as:  NEURONTIN  Take 300 mg by mouth at bedtime.     insulin  aspart 100 UNIT/ML injection  Commonly known as:  novoLOG  Inject 38 Units into the skin 3 (three) times daily before meals.     insulin aspart 100 UNIT/ML injection  Commonly known as:  novoLOG  Inject 0-15 Units into the skin 3 (three) times daily with meals.     insulin glargine 100 UNIT/ML injection  Commonly known as:  LANTUS  Inject 0.3 mLs (30 Units total) into the skin daily.     lisinopril-hydrochlorothiazide 20-12.5 MG per tablet  Commonly known as:  PRINZIDE,ZESTORETIC  Take 1 tablet by mouth 2 (two) times daily.     nitroGLYCERIN 0.4 MG SL tablet  Commonly known  as:  NITROSTAT  Place 1 tablet (0.4 mg total) under the tongue every 5 (five) minutes as needed for chest pain.     oxyCODONE 5 MG immediate release tablet  Commonly known as:  Oxy IR/ROXICODONE  Take 1 tablet (5 mg total) by mouth every 4 (four) hours as needed for moderate pain.     pantoprazole 40 MG tablet  Commonly known as:  PROTONIX  Take 1 tablet (40 mg total) by mouth 2 (two) times daily.     polyethylene glycol packet  Commonly known as:  MIRALAX / GLYCOLAX  Take 17 g by mouth daily.     promethazine 12.5 MG tablet  Commonly known as:  PHENERGAN  Take 1 tablet (12.5 mg total) by mouth every 6 (six) hours as needed for nausea or vomiting.     senna 8.6 MG Tabs tablet  Commonly known as:  SENOKOT  Take 1 tablet (8.6 mg total) by mouth daily.     simvastatin 20 MG tablet  Commonly known as:  ZOCOR  Take 1 tablet (20 mg total) by mouth at bedtime.       Allergies  Allergen Reactions  . Codeine Nausea Only    Derivatives -Nausea.  . Fentanyl     nausea  . Tramadol Other (See Comments)    unknown  . Trazodone And Nefazodone Nausea Only   Follow-up Information    Follow up with Ophelia Charter, MD In 2 weeks.   Specialty:  Neurosurgery   Contact information:   [address] [place] [address] [phone]       Follow up with Rica Mast, MD.    Specialty:  Internal Medicine   Contact information:   [address] [place] [address] [phone]       Follow up with Jenne Campus, MD In 2 weeks.   Specialty:  Neurosurgery   Contact information:   [place] [address] [phone]        The results of significant diagnostics from this hospitalization (including imaging, microbiology, ancillary and laboratory) are listed below for reference.    Significant Diagnostic Studies: Dg Pelvis 1-2 Views  05/06/2015   CLINICAL DATA:  Left hip and pelvic pain for 3 weeks without known injury. Initial encounter.  EXAM: PELVIS - 1-2 VIEW  COMPARISON:  None.  FINDINGS: There is no evidence of pelvic fracture or diastasis. No pelvic bone lesions are seen. Hip and sacroiliac joints appear normal.  IMPRESSION: Normal pelvis.   Electronically Signed   By: Marijo Conception, M.D.   On: 05/06/2015 16:59   Ct Head Wo Contrast  05/13/2015   CLINICAL DATA:  Left-sided numbness and tingling for 2 months.  EXAM: CT HEAD WITHOUT CONTRAST  TECHNIQUE: Contiguous axial images were obtained from the base of the skull through the vertex without intravenous contrast.  COMPARISON:  March 04, 2015  FINDINGS: Moderate diffuse atrophy is stable. There is no intracranial mass, hemorrhage, extra-axial fluid collection, or midline shift. There is small vessel disease in the centra semiovale bilaterally, moderate and stable. No new gray-white compartment lesion is identified. No acute infarct apparent. Bony calvarium appears intact. The mastoid air cells are clear. There is rightward deviation of the nasal septum, stable.  IMPRESSION: Stable atrophy with periventricular small vessel disease. No intracranial mass, hemorrhage, or acute appearing infarct. Stable nasal septal deviation.   Electronically Signed   By: Lowella Grip III M.D.   On: 05/13/2015 11:16  Ct Lumbar Spine W Contrast  05/17/2015   CLINICAL DATA:  69  year old male with left lower extremity radiculopathy. Subsequent encounter.  EXAM: LUMBAR MYELOGRAM  FLUOROSCOPY TIME:  2004.21 micro Gy cm2  PROCEDURE: Per request of Dr. Hal Neer, who felt it was necessary to perform lumbar myelogram today rather than hold Cymbalta for 48 hours. That the risk of holding blood thinner outweighed the small theoretical risk of seizures. This was discussed with the patient who agreed to proceed. After the procedure, Prisilla, the patient's nurse was notified of the potential theoretical risk of seizures and to contact Dr. Hal Neer is seizure was noted.  After thorough discussion of risks and benefits of the procedure including bleeding, infection, injury to nerves, blood vessels, adjacent structures as well as headache and CSF leak, written and oral informed consent was obtained. Consent was obtained by Dr. Genia Del. Time out form was completed.  Patient was positioned prone on the fluoroscopy table. Local anesthesia was provided with 1% lidocaine without epinephrine after prepped and draped in the usual sterile fashion. Puncture was performed at L3-4 using a 22-gauge spinal needle via (left Paramedian) approach. Using a single pass through the dura, the needle was placed within the thecal sac, with return of clear CSF. 9 cc of Omnipaque-180 was injected into the thecal sac, with normal opacification of the nerve roots and cauda equina consistent with free flow within the subarachnoid space.  I personally performed the lumbar puncture and administered the intrathecal contrast. I also personally supervised acquisition of the myelogram images.  TECHNIQUE: Contiguous axial images were obtained through the Lumbar spine after the intrathecal infusion of infusion. Coronal and sagittal reconstructions were obtained of the axial image sets.  COMPARISON:  05/13/2015 MR.  FINDINGS: LUMBAR MYELOGRAM FINDINGS:  No evidence of lumbar nerve root compression.  No abnormal motion occurs between  flexion and extension.  Atherosclerotic type changes of the abdominal aorta.  CT LUMBAR MYELOGRAM FINDINGS:  Last fully open disk space is labeled L5-S1. Present examination incorporates from T11-12 disc space through the lower sacrum.  Conus L1-2 level.  No abnormal vessels from the conus.  Calcified plaque abdominal aorta and iliac arteries incompletely assessed.  Remote mild T12 superior plate compression fracture treated with cement augmentation from right pedicle approach. Cement extends towards the inferior aspect of the disc.  T11-12:  Minimal bulge.  T12-L1:  Negative.  L1-2:  Negative.  L2-3: Minimal osteophyte left lateral position. No spinal stenosis, foraminal narrowing or nerve root compression.  L3-4: Mild bulge greatest left lateral position without nerve root compression. Mild facet joint degenerative changes.  L4-5: Bulge with osteophyte greatest left lateral position. No compression of the exiting nerve root. Mild facet joint degenerative changes.  L5-S1: Facet joint degenerative changes. Minimal bulge. Prominent epidural fat.  Mild bilateral sacroiliac joint degenerative changes.  IMPRESSION: LUMBAR MYELOGRAM IMPRESSION:  No evidence of lumbar nerve root compression.  No abnormal motion occurs between flexion and extension.  CT LUMBAR MYELOGRAM IMPRESSION:  Degenerative changes as detailed above without evidence of significant spinal stenosis, lateral recess stenosis, foraminal narrowing or nerve root compression.   Electronically Signed   By: Genia Del M.D.   On: 05/17/2015 15:27   Mr Cervical Spine Wo Contrast  05/07/2015   CLINICAL DATA:  Initial evaluation for persistent left leg pain, weakness, swelling.  EXAM: MRI CERVICAL AND LUMBAR SPINE WITHOUT CONTRAST  TECHNIQUE: Multiplanar and multiecho pulse sequences of the cervical spine, to include the craniocervical junction and cervicothoracic  junction, and lumbar spine, were obtained without intravenous contrast.  COMPARISON:  None.   FINDINGS: MRI CERVICAL SPINE FINDINGS  Patchy T2 hyperdensity within the partially visualized pons noted, which may be related chronic small vessel ischemic disease. Brainstem and cervical spinal cord are mildly atrophic in appearance. Mild cerebellar atrophy. Craniocervical junction widely patent.  Mild straightening with slight reversal of the normal cervical lordosis with apex at C5. No listhesis. Patient is status post ACDF at C4-5 and C6-7. Hardware appears well positioned. Signal intensity within the vertebral body bone marrow is normal. No focal osseous lesion. No marrow edema.  Signal intensity within the cervical spinal cord is normal. No focal intra medullary lesions.  Paraspinous soft tissues are unremarkable. Normal intravascular flow voids present within the vertebral arteries bilaterally. No prevertebral edema.  C2-3: Broad-based posterior disc bulge mildly effaces the ventral thecal sac with resultant mild canal narrowing. Foramina remain widely patent.  C3-4: Mild diffuse degenerative disc osteophyte with mild bilateral uncovertebral hypertrophy. A superimposed central/right paracentral shallow disc protrusion indents the ventral thecal sac and contacts the cervical spinal cord. There is resultant moderate canal stenosis. Cervical spinal cord is mildly flattened without cord signal changes. There is mild left foraminal narrowing without significant right foraminal stenosis.  C4-5: Status post ACDF. Moderate canal stenosis. Mild left-sided uncovertebral hypertrophy with resultant mild to moderate left foraminal narrowing. No significant right foraminal stenosis.  C5-6: Mild diffuse degenerative disc osteophyte with right-sided facet arthrosis. Posterior disc osteophyte partially effaces the ventral thecal sac and results in moderate canal stenosis. There is moderate right foraminal narrowing without significant left foraminal stenosis.  C6-7: Status post ACDF. Moderate canal narrowing. Uncovertebral  and facet hypertrophy present bilaterally, left greater than right. There is resultant moderate bilateral foraminal narrowing, left greater than right.  C7-T1: Mild right-sided uncovertebral hypertrophy with resultant mild right foraminal narrowing. Left neural foramen remains widely patent. Minimal posterior disc osteophyte without significant canal stenosis.  MRI THORACIC SPINE FINDINGS  Vertebral bodies are normally aligned with preservation of the normal thoracic kyphosis.  Sequelae of prior T12 kyphoplasty present. There is mild chronic anterior height loss at the superior endplate at this level.  Otherwise, vertebral body heights error fairly well-maintained. There are scattered chronic degenerative endplate Schmorl's nodes throughout the mid and lower thoracic spine, most prevalent about the T6-7 intervertebral disc space an the inferior endplate of T8 and T9. Fatty degenerative endplate changes present at the superior endplate of T8. Diffuse disc desiccation present.  Bone marrow signal intensity within normal limits. No focal osseous lesion. No marrow edema.  Signal intensity within the thoracic spinal cord is within normal limits. No focal intra medullary lesion. There is prominent epidural lipomatosis within the dorsal epidural space extending from T3-4 through T9-10. Thoracic spinal cord is displaced anteriorly at these levels.  Paraspinous soft tissues are within normal limits. Mild atelectatic changes noted within the visualized lungs.  At T4-5, there is a small left paracentral/foraminal disc protrusion partially effacing the left ventral thecal sac and flattening the left hemi cord without cord signal changes (series 15, image 14). No significant canal or foraminal narrowing.  At T6-7, there is a tiny right paracentral disc protrusion minimally indenting the ventral thecal sac without significant stenosis (series 15, image 20).  At T7-8, a small right paracentral disc protrusion flattens the right  ventral thecal sac with mild flattening of the right hemi cord. No cord signal changes or significant stenosis.  At T8-9, tiny left paracentral disc protrusion without  significant stenosis (series 16, image 26).  At T9-10, there is prominent bilateral facet arthrosis without significant stenosis. Minimal disc bulge at this level.  At T10-11, mild bilateral facet arthrosis without significant stenosis.  IMPRESSION: MRI CERVICAL SPINE IMPRESSION:  1. Status post ACDF at C4-5 and S9-3 without complication. 2. 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 Mild superimposed degenerative spondylolysis as above. No cord signal changes identified. 3. Small vessel type changes within the partially visualized pons.  MRI THORACIC SPINE IMPRESSION:  1. Mild multilevel degenerative spondylolysis as detailed above without significant canal or foraminal stenosis. 2. Sequelae of prior vertebral augmentation within the T12 vertebral body. 3. Prominent epidural lipomatosis within the dorsal epidural space extending from T3-4 through T9-10.   Electronically Signed   By: Jeannine Boga M.D.   On: 05/07/2015 03:13   Mr Thoracic Spine Wo Contrast  05/07/2015   CLINICAL DATA:  Initial evaluation for persistent left leg pain, weakness, swelling.  EXAM: MRI CERVICAL AND LUMBAR SPINE WITHOUT CONTRAST  TECHNIQUE: Multiplanar and multiecho pulse sequences of the cervical spine, to include the craniocervical junction and cervicothoracic junction, and lumbar spine, were obtained without intravenous contrast.  COMPARISON:  None.  FINDINGS: MRI CERVICAL SPINE FINDINGS  Patchy T2 hyperdensity within the partially visualized pons noted, which may be related chronic small vessel ischemic disease. Brainstem and cervical spinal cord are mildly atrophic in appearance. Mild cerebellar atrophy. Craniocervical junction widely patent.  Mild straightening with  slight reversal of the normal cervical lordosis with apex at C5. No listhesis. Patient is status post ACDF at C4-5 and C6-7. Hardware appears well positioned. Signal intensity within the vertebral body bone marrow is normal. No focal osseous lesion. No marrow edema.  Signal intensity within the cervical spinal cord is normal. No focal intra medullary lesions.  Paraspinous soft tissues are unremarkable. Normal intravascular flow voids present within the vertebral arteries bilaterally. No prevertebral edema.  C2-3: Broad-based posterior disc bulge mildly effaces the ventral thecal sac with resultant mild canal narrowing. Foramina remain widely patent.  C3-4: Mild diffuse degenerative disc osteophyte with mild bilateral uncovertebral hypertrophy. A superimposed central/right paracentral shallow disc protrusion indents the ventral thecal sac and contacts the cervical spinal cord. There is resultant moderate canal stenosis. Cervical spinal cord is mildly flattened without cord signal changes. There is mild left foraminal narrowing without significant right foraminal stenosis.  C4-5: Status post ACDF. Moderate canal stenosis. Mild left-sided uncovertebral hypertrophy with resultant mild to moderate left foraminal narrowing. No significant right foraminal stenosis.  C5-6: Mild diffuse degenerative disc osteophyte with right-sided facet arthrosis. Posterior disc osteophyte partially effaces the ventral thecal sac and results in moderate canal stenosis. There is moderate right foraminal narrowing without significant left foraminal stenosis.  C6-7: Status post ACDF. Moderate canal narrowing. Uncovertebral and facet hypertrophy present bilaterally, left greater than right. There is resultant moderate bilateral foraminal narrowing, left greater than right.  C7-T1: Mild right-sided uncovertebral hypertrophy with resultant mild right foraminal narrowing. Left neural foramen remains widely patent. Minimal posterior disc osteophyte  without significant canal stenosis.  MRI THORACIC SPINE FINDINGS  Vertebral bodies are normally aligned with preservation of the normal thoracic kyphosis.  Sequelae of prior T12 kyphoplasty present. There is mild chronic anterior height loss at the superior endplate at this level.  Otherwise, vertebral body heights error fairly well-maintained. There are scattered chronic degenerative endplate Schmorl's nodes throughout the mid and lower thoracic spine, most  prevalent about the T6-7 intervertebral disc space an the inferior endplate of T8 and T9. Fatty degenerative endplate changes present at the superior endplate of T8. Diffuse disc desiccation present.  Bone marrow signal intensity within normal limits. No focal osseous lesion. No marrow edema.  Signal intensity within the thoracic spinal cord is within normal limits. No focal intra medullary lesion. There is prominent epidural lipomatosis within the dorsal epidural space extending from T3-4 through T9-10. Thoracic spinal cord is displaced anteriorly at these levels.  Paraspinous soft tissues are within normal limits. Mild atelectatic changes noted within the visualized lungs.  At T4-5, there is a small left paracentral/foraminal disc protrusion partially effacing the left ventral thecal sac and flattening the left hemi cord without cord signal changes (series 15, image 14). No significant canal or foraminal narrowing.  At T6-7, there is a tiny right paracentral disc protrusion minimally indenting the ventral thecal sac without significant stenosis (series 15, image 20).  At T7-8, a small right paracentral disc protrusion flattens the right ventral thecal sac with mild flattening of the right hemi cord. No cord signal changes or significant stenosis.  At T8-9, tiny left paracentral disc protrusion without significant stenosis (series 16, image 26).  At T9-10, there is prominent bilateral facet arthrosis without significant stenosis. Minimal disc bulge at this  level.  At T10-11, mild bilateral facet arthrosis without significant stenosis.  IMPRESSION: MRI CERVICAL SPINE IMPRESSION:  1. Status post ACDF at C4-5 and W5-8 without complication. 2. 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 Mild superimposed degenerative spondylolysis as above. No cord signal changes identified. 3. Small vessel type changes within the partially visualized pons.  MRI THORACIC SPINE IMPRESSION:  1. Mild multilevel degenerative spondylolysis as detailed above without significant canal or foraminal stenosis. 2. Sequelae of prior vertebral augmentation within the T12 vertebral body. 3. Prominent epidural lipomatosis within the dorsal epidural space extending from T3-4 through T9-10.   Electronically Signed   By: Jeannine Boga M.D.   On: 05/07/2015 03:13   Mr Lumbar Spine Wo Contrast  05/13/2015   CLINICAL DATA:  Low back pain. Unable to walk due to pain. Pain extends down the left leg. Onset of symptoms 02/23/2015. Incontinence.  EXAM: MRI LUMBAR SPINE WITHOUT CONTRAST  TECHNIQUE: Multiplanar, multisequence MR imaging of the lumbar spine was performed. No intravenous contrast was administered.  COMPARISON:  05/05/2015  FINDINGS: Vertebral alignment is unchanged and within normal limits. Chronic T12 compression fracture is again noted status post vertebral augmentation. Lumbar vertebral body heights are preserved. Mild-to-moderate disc desiccation is present throughout the lumbar spine. No vertebral marrow edema is seen. Conus medullaris is normal in signal and terminates at the inferior aspect of L1. Paraspinal soft tissues are unremarkable aside from edema in the subcutaneous tissues of the lower back.  L1-2 and L2-3:  Negative.  L3-4: Minimal disc bulging and facet hypertrophy without stenosis, unchanged.  L4-5: Mild disc bulging asymmetric to the left and mild facet hypertrophy result in mild  left lateral recess narrowing with slight posterior deflection of the left L5 nerve root, unchanged. No spinal canal or neural foraminal stenosis. Tiny left subarticular annular fissure is again seen.  L5-S1: Shallow left foraminal disc protrusion and mild facet hypertrophy without stenosis, unchanged.  IMPRESSION: Mild lumbar disc and facet degeneration without interval change. Mild left lateral recess stenosis at L4-5 could irritate the left L5 nerve root.   Electronically Signed  By: Logan Bores   On: 05/13/2015 14:50   Mr Lumbar Spine Wo Contrast  05/05/2015   CLINICAL DATA:  Worsening left lower extremity weakness.  EXAM: MRI LUMBAR SPINE WITHOUT CONTRAST  TECHNIQUE: Multiplanar, multisequence MR imaging of the lumbar spine was performed. No intravenous contrast was administered.  COMPARISON:  03/01/2015  FINDINGS: Vertebral alignment is within normal limits. Prior vertebral augmentation is again noted at T12. Lumbar vertebral body heights are preserved. No vertebral marrow edema is seen. Vertebral body heights are relatively well preserved. Conus medullaris is normal in signal and terminates at the inferior aspect of L1. Tiny right renal cyst is again noted.  L1-2 and L2-3:  Negative.  L3-4: Minimal disc bulging and mild facet hypertrophy without stenosis, unchanged.  L4-5: Mild disc bulging asymmetric to the left and mild facet hypertrophy result in minimal left lateral recess narrowing, unchanged. No spinal canal or neural foraminal stenosis.  L5-S1: Shallow left foraminal disc protrusion and mild facet hypertrophy without stenosis, unchanged.  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 No clear neural impingement.   Electronically Signed   By: Logan Bores   On: 05/05/2015 19:13   Mr Hip Left Wo Contrast  05/07/2015   CLINICAL DATA:  Low back pain for 2-3 months which has worsened over the past week. Left leg weakness. No known injury. Initial encounter.   EXAM: MR OF THE LEFT HIP WITHOUT CONTRAST  TECHNIQUE: Multiplanar, multisequence MR imaging was performed. No intravenous contrast was administered.  COMPARISON:  CT abdomen and pelvis 03/04/2015.  FINDINGS: Bones: Marrow signal is normal in the femoral heads bilaterally without fracture or avascular necrosis. The pelvis and visualized femurs also demonstrate normal signal.  Articular cartilage and labrum  Articular cartilage:  Unremarkable.  Labrum:  Intact.  Joint or bursal effusion  Joint effusion:  None.  Bursae:  Unremarkable.  Muscles and tendons  Muscles and tendons:  Intact and unremarkable in appearance.  Other findings  Miscellaneous: The prostate gland is enlarged. Fat containing inguinal hernias are seen, larger on the left.  IMPRESSION: Normal-appearing pelvis and hips. No finding to explain the patient's symptoms.  Prostatomegaly.   Electronically Signed   By: Inge Rise M.D.   On: 05/07/2015 13:49   Dg Myelography Lumbar Inj Lumbosacral  05/17/2015   CLINICAL DATA:  69 year old male with left lower extremity radiculopathy. Subsequent encounter.  EXAM: LUMBAR MYELOGRAM  FLUOROSCOPY TIME:  2004.21 micro Gy cm2  PROCEDURE: Per request of Dr. Hal Neer, who felt it was necessary to perform lumbar myelogram today rather than hold Cymbalta for 48 hours. That the risk of holding blood thinner outweighed the small theoretical risk of seizures. This was discussed with the patient who agreed to proceed. After the procedure, Prisilla, the patient's nurse was notified of the potential theoretical risk of seizures and to contact Dr. Hal Neer is seizure was noted.  After thorough discussion of risks and benefits of the procedure including bleeding, infection, injury to nerves, blood vessels, adjacent structures as well as headache and CSF leak, written and oral informed consent was obtained. Consent was obtained by Dr. Genia Del. Time out form was completed.  Patient was positioned prone on the  fluoroscopy table. Local anesthesia was provided with 1% lidocaine without epinephrine after prepped and draped in the usual sterile fashion. Puncture was performed at L3-4 using a 22-gauge spinal needle via (left Paramedian) approach. Using a single pass through the dura, the needle was placed within the  thecal sac, with return of clear CSF. 9 cc of Omnipaque-180 was injected into the thecal sac, with normal opacification of the nerve roots and cauda equina consistent with free flow within the subarachnoid space.  I personally performed the lumbar puncture and administered the intrathecal contrast. I also personally supervised acquisition of the myelogram images.  TECHNIQUE: Contiguous axial images were obtained through the Lumbar spine after the intrathecal infusion of infusion. Coronal and sagittal reconstructions were obtained of the axial image sets.  COMPARISON:  05/13/2015 MR.  FINDINGS: LUMBAR MYELOGRAM FINDINGS:  No evidence of lumbar nerve root compression.  No abnormal motion occurs between flexion and extension.  Atherosclerotic type changes of the abdominal aorta.  CT LUMBAR MYELOGRAM FINDINGS:  Last fully open disk space is labeled L5-S1. Present examination incorporates from T11-12 disc space through the lower sacrum.  Conus L1-2 level.  No abnormal vessels from the conus.  Calcified plaque abdominal aorta and iliac arteries incompletely assessed.  Remote mild T12 superior plate compression fracture treated with cement augmentation from right pedicle approach. Cement extends towards the inferior aspect of the disc.  T11-12:  Minimal bulge.  T12-L1:  Negative.  L1-2:  Negative.  L2-3: Minimal osteophyte left lateral position. No spinal stenosis, foraminal narrowing or nerve root compression.  L3-4: Mild bulge greatest left lateral position without nerve root compression. Mild facet joint degenerative changes.  L4-5: Bulge with osteophyte greatest left lateral position. No compression of the exiting  nerve root. Mild facet joint degenerative changes.  L5-S1: Facet joint degenerative changes. Minimal bulge. Prominent epidural fat.  Mild bilateral sacroiliac joint degenerative changes.  IMPRESSION: LUMBAR MYELOGRAM IMPRESSION:  No evidence of lumbar nerve root compression.  No abnormal motion occurs between flexion and extension.  CT LUMBAR MYELOGRAM IMPRESSION:  Degenerative changes as detailed above without evidence of significant spinal stenosis, lateral recess stenosis, foraminal narrowing or nerve root compression.   Electronically Signed   By: Genia Del M.D.   On: 05/17/2015 15:27    Microbiology: No results found for this or any previous visit (from the past 240 hour(s)).   Labs: Basic Metabolic Panel:  Recent Labs Lab 05/13/15 1051 05/14/15 0500 05/14/15 1440 05/16/15 0400 05/18/15 0422  NA 137 134* 132* 137 136  K 4.5 4.5 4.6 4.6 4.3  CL 102 100* 98* 100* 101  CO2 _0 GLUCOSE 237* 336* 514* 307* 194*  BUN 17 16 21* 25* 27*  CREATININE 1.06 1.13 1.45* 1.14 1.25*  CALCIUM 9.2 9.0 8.9 8.9 8.6*   Liver Function Tests:  Recent Labs Lab 05/14/15 0500 05/16/15 0400 05/18/15 0422  AST _1 ALT 6* 7* 6*  ALKPHOS 55 53 71  BILITOT 0.6 0.4 0.8  PROT 6.1* 5.7* 5.6*  ALBUMIN 3.4* 3.3* 3.2*   No results for input(s): LIPASE, AMYLASE in the last 168 hours. No results for input(s): AMMONIA in the last 168 hours. CBC:  Recent Labs Lab 05/13/15 1051 05/14/15 0500 05/16/15 0400 05/18/15 0422  WBC 8.3 7.1 15.4* 12.1*  NEUTROABS 5.4  --  12.5*  --   HGB 13.8 14.2 13.4 14.0  HCT 40.4 41.4 39.3 41.0  MCV 76.1* 76.8* 77.1* 77.7*  PLT 253 244 262 224   Cardiac Enzymes: No results for input(s): CKTOTAL, CKMB, CKMBINDEX, TROPONINI in the last 168 hours. BNP: BNP (last 3 results) No results for input(s): BNP in the last 8760 hours.  ProBNP (last 3 results) No results for input(s): PROBNP in the last  8760 hours.  CBG:  Recent Labs Lab  05/18/15 0526 05/18/15 1121 05/18/15 1630 05/18/15 2111 05/19/15 0629  GLUCAP 190* 400* 289* 261* 267*       Signed:  Rowe Clack N  [facility] 05/19/2015, 11:09 AM

## 2015-05-19 NOTE — Progress Notes (Signed)
[facility] PROGRESS NOTE  TYMARION EVERARD BWG:[id number] DOB: Oct 23, 1946 DOA: 05/13/2015 PCP: Rica Mast, MD  Assessment/Plan: 70 y/omale with PMH of HTN, CAD s/p DES stent (02/2015), parkinson's disease, presented with back pain, radiating to the R leg and difficulty with ambulation.   1. Back pains L leg pain. DJD. Initially thought possible lumbar cord compression. But imaging is neg.  MRI: Mild lumbar disc and facet degeneration without interval change.Mild left lateral recess stenosis at L4-5 could irritate the left L5 nerve root.  CT myelogram: No evidence of lumbar nerve root compression -Patient is improving, he received steroids IV. Patent was evaluated by neurosurgery who recommended PT/rehab. Pain control but no neurosurgical intervention at this time . Cont PT. Needs SNF  2. CAD s/p DES stent (02/2015). Plavix was on hold few few days due to spinal myelogram plans for possible surgery. We have restarted plavix post CT myelogram. No surgery is planned. No acute chest pains. Cont BB 3. 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 Episodes of hypo/hyperglycemia. Discontinued steroids. Patient reports taking U 500 of 20-30 units TID with meals. But had significant hypoglycemic episodes  -Currently he is on aspart at 38 Units TID+lantus (increased to 30 Units today). Cont close monitor, since off steroids.  4. HTN. Resume BP meds. Titrate as needed   D/w patient, his wife, his son at length. All questions answered  Code Status: full Family Communication: d/w patient, his wife  (indicate person spoken with, relationship, and if by phone, the number) Disposition Plan: likely SNF. Soon    Consultants:  Neurosurgery   Cardiology   Procedures:  none  Antibiotics:  none (indicate start date, and stop date if known)  HPI/Subjective: alert  Objective: Filed Vitals:   05/19/15 0505  BP: 151/67  Pulse: 70  Temp: 97.6 F (36.4 C)  Resp: 18    Intake/Output Summary  (Last 24 hours) at 05/19/15 0903 Last data filed at 05/19/15 0509  Gross per 24 hour  Intake    600 ml  Output   1750 ml  Net  -1150 ml   There were no vitals filed for this visit.  Exam:   General:  No distress   Cardiovascular: s1,s2 rrr  Respiratory: CTA BL  Abdomen: soft, nt,n d  Musculoskeletal: mild pedal edema. Chronic per patient    Data Reviewed: Basic Metabolic Panel:  Recent Labs Lab 05/13/15 1051 05/14/15 0500 05/14/15 1440 05/16/15 0400 05/18/15 0422  NA 137 134* 132* 137 136  K 4.5 4.5 4.6 4.6 4.3  CL 102 100* 98* 100* 101  CO2 25 25 23 29 26   GLUCOSE 237* 336* 514* 307* 194*  BUN 17 16 21* 25* 27*  CREATININE 1.06 1.13 1.45* 1.14 1.25*  CALCIUM 9.2 9.0 8.9 8.9 8.6*   Liver Function Tests:  Recent Labs Lab 05/14/15 0500 05/16/15 0400 05/18/15 0422  AST 21 16 30   ALT 6* 7* 6*  ALKPHOS 55 53 71  BILITOT 0.6 0.4 0.8  PROT 6.1* 5.7* 5.6*  ALBUMIN 3.4* 3.3* 3.2*   No results for input(s): LIPASE, AMYLASE in the last 168 hours. No results for input(s): AMMONIA in the last 168 hours. CBC:  Recent Labs Lab 05/13/15 1051 05/14/15 0500 05/16/15 0400 05/18/15 0422  WBC 8.3 7.1 15.4* 12.1*  NEUTROABS 5.4  --  12.5*  --   HGB 13.8 14.2 13.4 14.0  HCT 40.4 41.4 39.3 41.0  MCV 76.1* 76.8* 77.1* 77.7*  PLT 253 244 262 224   Cardiac Enzymes: No  results for input(s): CKTOTAL, CKMB, CKMBINDEX, TROPONINI in the last 168 hours. BNP (last 3 results) No results for input(s): BNP in the last 8760 hours.  ProBNP (last 3 results) No results for input(s): PROBNP in the last 8760 hours.  CBG:  Recent Labs Lab 05/18/15 0526 05/18/15 1121 05/18/15 1630 05/18/15 2111 05/19/15 0629  GLUCAP 190* 400* 289* 261* 267*    No results found for this or any previous visit (from the past 240 hour(s)).   Studies: Ct Lumbar Spine W Contrast  05/17/2015   CLINICAL DATA:  69 year old male with left lower extremity radiculopathy. Subsequent  encounter.  EXAM: LUMBAR MYELOGRAM  FLUOROSCOPY TIME:  2004.21 micro Gy cm2  PROCEDURE: Per request of Dr. Hal Neer, who felt it was necessary to perform lumbar myelogram today rather than hold Cymbalta for 48 hours. That the risk of holding blood thinner outweighed the small theoretical risk of seizures. This was discussed with the patient who agreed to proceed. After the procedure, Prisilla, the patient's nurse was notified of the potential theoretical risk of seizures and to contact Dr. Hal Neer is seizure was noted.  After thorough discussion of risks and benefits of the procedure including bleeding, infection, injury to nerves, blood vessels, adjacent structures as well as headache and CSF leak, written and oral informed consent was obtained. Consent was obtained by Dr. Genia Del. Time out form was completed.  Patient was positioned prone on the fluoroscopy table. Local anesthesia was provided with 1% lidocaine without epinephrine after prepped and draped in the usual sterile fashion. Puncture was performed at L3-4 using a 22-gauge spinal needle via (left Paramedian) approach. Using a single pass through the dura, the needle was placed within the thecal sac, with return of clear CSF. 9 cc of Omnipaque-180 was injected into the thecal sac, with normal opacification of the nerve roots and cauda equina consistent with free flow within the subarachnoid space.  I personally performed the lumbar puncture and administered the intrathecal contrast. I also personally supervised acquisition of the myelogram images.  TECHNIQUE: Contiguous axial images were obtained through the Lumbar spine after the intrathecal infusion of infusion. Coronal and sagittal reconstructions were obtained of the axial image sets.  COMPARISON:  05/13/2015 MR.  FINDINGS: LUMBAR MYELOGRAM FINDINGS:  No evidence of lumbar nerve root compression.  No abnormal motion occurs between flexion and extension.  Atherosclerotic type changes of the  abdominal aorta.  CT LUMBAR MYELOGRAM FINDINGS:  Last fully open disk space is labeled L5-S1. Present examination incorporates from T11-12 disc space through the lower sacrum.  Conus L1-2 level.  No abnormal vessels from the conus.  Calcified plaque abdominal aorta and iliac arteries incompletely assessed.  Remote mild T12 superior plate compression fracture treated with cement augmentation from right pedicle approach. Cement extends towards the inferior aspect of the disc.  T11-12:  Minimal bulge.  T12-L1:  Negative.  L1-2:  Negative.  L2-3: Minimal osteophyte left lateral position. No spinal stenosis, foraminal narrowing or nerve root compression.  L3-4: Mild bulge greatest left lateral position without nerve root compression. Mild facet joint degenerative changes.  L4-5: Bulge with osteophyte greatest left lateral position. No compression of the exiting nerve root. Mild facet joint degenerative changes.  L5-S1: Facet joint degenerative changes. Minimal bulge. Prominent epidural fat.  Mild bilateral sacroiliac joint degenerative changes.  IMPRESSION: LUMBAR MYELOGRAM IMPRESSION:  No evidence of lumbar nerve root compression.  No abnormal motion occurs between flexion and extension.  CT LUMBAR MYELOGRAM IMPRESSION:  Degenerative changes  as detailed above without evidence of significant spinal stenosis, lateral recess stenosis, foraminal narrowing or nerve root compression.   Electronically Signed   By: Genia Del M.D.   On: 05/17/2015 15:27   Dg Myelography Lumbar Inj Lumbosacral  05/17/2015   CLINICAL DATA:  69 year old male with left lower extremity radiculopathy. Subsequent encounter.  EXAM: LUMBAR MYELOGRAM  FLUOROSCOPY TIME:  2004.21 micro Gy cm2  PROCEDURE: Per request of Dr. Hal Neer, who felt it was necessary to perform lumbar myelogram today rather than hold Cymbalta for 48 hours. That the risk of holding blood thinner outweighed the small theoretical risk of seizures. This was discussed with the  patient who agreed to proceed. After the procedure, Prisilla, the patient's nurse was notified of the potential theoretical risk of seizures and to contact Dr. Hal Neer is seizure was noted.  After thorough discussion of risks and benefits of the procedure including bleeding, infection, injury to nerves, blood vessels, adjacent structures as well as headache and CSF leak, written and oral informed consent was obtained. Consent was obtained by Dr. Genia Del. Time out form was completed.  Patient was positioned prone on the fluoroscopy table. Local anesthesia was provided with 1% lidocaine without epinephrine after prepped and draped in the usual sterile fashion. Puncture was performed at L3-4 using a 22-gauge spinal needle via (left Paramedian) approach. Using a single pass through the dura, the needle was placed within the thecal sac, with return of clear CSF. 9 cc of Omnipaque-180 was injected into the thecal sac, with normal opacification of the nerve roots and cauda equina consistent with free flow within the subarachnoid space.  I personally performed the lumbar puncture and administered the intrathecal contrast. I also personally supervised acquisition of the myelogram images.  TECHNIQUE: Contiguous axial images were obtained through the Lumbar spine after the intrathecal infusion of infusion. Coronal and sagittal reconstructions were obtained of the axial image sets.  COMPARISON:  05/13/2015 MR.  FINDINGS: LUMBAR MYELOGRAM FINDINGS:  No evidence of lumbar nerve root compression.  No abnormal motion occurs between flexion and extension.  Atherosclerotic type changes of the abdominal aorta.  CT LUMBAR MYELOGRAM FINDINGS:  Last fully open disk space is labeled L5-S1. Present examination incorporates from T11-12 disc space through the lower sacrum.  Conus L1-2 level.  No abnormal vessels from the conus.  Calcified plaque abdominal aorta and iliac arteries incompletely assessed.  Remote mild T12 superior plate  compression fracture treated with cement augmentation from right pedicle approach. Cement extends towards the inferior aspect of the disc.  T11-12:  Minimal bulge.  T12-L1:  Negative.  L1-2:  Negative.  L2-3: Minimal osteophyte left lateral position. No spinal stenosis, foraminal narrowing or nerve root compression.  L3-4: Mild bulge greatest left lateral position without nerve root compression. Mild facet joint degenerative changes.  L4-5: Bulge with osteophyte greatest left lateral position. No compression of the exiting nerve root. Mild facet joint degenerative changes.  L5-S1: Facet joint degenerative changes. Minimal bulge. Prominent epidural fat.  Mild bilateral sacroiliac joint degenerative changes.  IMPRESSION: LUMBAR MYELOGRAM IMPRESSION:  No evidence of lumbar nerve root compression.  No abnormal motion occurs between flexion and extension.  CT LUMBAR MYELOGRAM IMPRESSION:  Degenerative changes as detailed above without evidence of significant spinal stenosis, lateral recess stenosis, foraminal narrowing or nerve root compression.   Electronically Signed   By: Genia Del M.D.   On: 05/17/2015 15:27    Scheduled Meds: . amLODipine  10 mg Oral Daily  .  atorvastatin  80 mg Oral q1800  . carbidopa-levodopa  1 tablet Oral BID  . carvedilol  25 mg Oral BID  . clopidogrel  75 mg Oral Daily  . diclofenac sodium  2 g Topical QID  . DULoxetine  60 mg Oral Daily  . gabapentin  300 mg Oral BID  . heparin  5,000 Units Subcutaneous 3 times per day  . lisinopril  20 mg Oral Daily   And  . hydrochlorothiazide  12.5 mg Oral Daily  . insulin aspart  0-15 Units Subcutaneous TID WC  . insulin aspart  0-5 Units Subcutaneous QHS  . insulin aspart  38 Units Subcutaneous TID AC  . insulin glargine  20 Units Subcutaneous Daily  . LORazepam  1 mg Intravenous Once  . ondansetron  4 mg Oral Once  . pantoprazole  40 mg Oral BID  . polyethylene glycol  17 g Oral Daily  . 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 If 7PM-7AM, please contact night-coverage at [email or web address], password [facility] 05/19/2015, 9:03 AM  LOS: 6 days

## 2015-05-19 NOTE — Care Management (Signed)
Important Message  Patient Details  Name: Steve Phillips MRN: [id number] Date of Birth: 07-19-1946   Medicare Important Message Given:  Yes-third notification given    Pricilla Handler 05/19/2015, 2:43 PM

## 2015-05-19 NOTE — Progress Notes (Signed)
Report called to [facility].

## 2015-05-19 NOTE — Progress Notes (Signed)
UR Completed. Jahir Halt, RN, BSN.  [phone] 

## 2015-05-19 NOTE — Progress Notes (Signed)
Rehab admissions - Evaluated for possible admission.  Please see rehab consult done yesterday recommending SNF or HH therapies.  I discussed this recommendation with wife and patient.  Wife was upset because she really wanted inpatient rehab for patient.  Social worker is aware of need for SNF placement and both patient and wife are now in agreement to SNF.  Call me for questions.  #[phone]

## 2015-05-24 ENCOUNTER — Encounter: Payer: Self-pay | Admitting: Internal Medicine

## 2015-05-24 ENCOUNTER — Telehealth: Payer: Self-pay | Admitting: Internal Medicine

## 2015-05-24 DIAGNOSIS — G20 Parkinson's disease: Secondary | ICD-10-CM | POA: Diagnosis not present

## 2015-05-24 DIAGNOSIS — I10 Essential (primary) hypertension: Secondary | ICD-10-CM | POA: Diagnosis not present

## 2015-05-24 DIAGNOSIS — M545 Low back pain: Secondary | ICD-10-CM | POA: Diagnosis not present

## 2015-05-24 DIAGNOSIS — E782 Mixed hyperlipidemia: Secondary | ICD-10-CM | POA: Diagnosis not present

## 2015-05-24 DIAGNOSIS — I2510 Atherosclerotic heart disease of native coronary artery without angina pectoris: Secondary | ICD-10-CM | POA: Diagnosis not present

## 2015-05-24 DIAGNOSIS — D519 Vitamin B12 deficiency anemia, unspecified: Secondary | ICD-10-CM | POA: Diagnosis not present

## 2015-05-24 DIAGNOSIS — E1165 Type 2 diabetes mellitus with hyperglycemia: Secondary | ICD-10-CM | POA: Diagnosis not present

## 2015-05-24 DIAGNOSIS — D509 Iron deficiency anemia, unspecified: Secondary | ICD-10-CM | POA: Diagnosis not present

## 2015-05-24 NOTE — Telephone Encounter (Signed)
Pt scheduled 7.11.16 at 11 am.

## 2015-05-25 ENCOUNTER — Telehealth: Payer: Self-pay | Admitting: Nutrition

## 2015-05-25 ENCOUNTER — Telehealth: Payer: Self-pay | Admitting: *Deleted

## 2015-05-25 NOTE — Telephone Encounter (Signed)
Steve Phillips, I sent [facility] a message to call them and ask them to fax me a form with his sugar log and his current insulin doses. I need those to decide. Recent BUN and Cr will also help to see if he can use his Metformin if he is not on it already.

## 2015-05-25 NOTE — Telephone Encounter (Signed)
Wife called saying that the [facility], (where her husband is staying), has U 500 R insulin on their formulary, and are able to give it to him.  Please call/fax the prescription to them.  She is not sure if they have the pens, or the vials.

## 2015-05-25 NOTE — Telephone Encounter (Signed)
RN at the [facility], Sanda Linger to fax a copy of the pt's sugar readings to you. Be advised.

## 2015-05-25 NOTE — Telephone Encounter (Signed)
Called and spoke with Sanda Linger, RN at rehab facility, [facility] [phone]). Asked him to fax a copy of the pt's sugar logs to our office for Dr Cruzita Lederer to review. He voiced understanding and will. He stated pt's b/s is running 200 to over 300. Advised him that Dr Cruzita Lederer will need these readings to make any medication changes. He will fax b/s readings.

## 2015-05-27 ENCOUNTER — Encounter: Payer: Self-pay | Admitting: Internal Medicine

## 2015-05-27 NOTE — Progress Notes (Signed)
Received new sugar log from patient's facility, with still very high blood sugars between 176 and 377, despite increasing the Lantus dose to 35 units 2x a day and continuing 38 units of Humalog 3x a day before meals, plus Humalog sliding scale.   At this point, I would suggest stopping Lantus and Humalog and restarting patient's U500 insulin as follows:  60 units (0.12 mL) before breakfast  60 units (0.12 mL) before lunch  75 units (0.17 mL) before dinner Please send sugar log again Monday afternoon.

## 2015-05-27 NOTE — Progress Notes (Signed)
Faxed a copy of Dr Arman Filter note to [facility] [phone] with medication changes.

## 2015-05-30 ENCOUNTER — Encounter: Payer: Self-pay | Admitting: Internal Medicine

## 2015-05-30 ENCOUNTER — Ambulatory Visit (INDEPENDENT_AMBULATORY_CARE_PROVIDER_SITE_OTHER): Payer: Medicare Other | Admitting: Internal Medicine

## 2015-05-30 VITALS — BP 138/77 | HR 59 | Temp 98.4°F

## 2015-05-30 DIAGNOSIS — I200 Unstable angina: Secondary | ICD-10-CM

## 2015-05-30 DIAGNOSIS — M5416 Radiculopathy, lumbar region: Secondary | ICD-10-CM | POA: Diagnosis not present

## 2015-05-30 DIAGNOSIS — E1149 Type 2 diabetes mellitus with other diabetic neurological complication: Secondary | ICD-10-CM

## 2015-05-30 DIAGNOSIS — E1140 Type 2 diabetes mellitus with diabetic neuropathy, unspecified: Secondary | ICD-10-CM

## 2015-05-30 DIAGNOSIS — I10 Essential (primary) hypertension: Secondary | ICD-10-CM | POA: Diagnosis not present

## 2015-05-30 LAB — COMPREHENSIVE METABOLIC PANEL
ALBUMIN: 3.7 g/dL (ref 3.5–5.2)
ALK PHOS: 73 U/L (ref 39–117)
ALT: 18 U/L (ref 0–53)
AST: 29 U/L (ref 0–37)
BUN: 23 mg/dL (ref 6–23)
CO2: 25 meq/L (ref 19–32)
CREATININE: 1.2 mg/dL (ref 0.40–1.50)
Calcium: 9.1 mg/dL (ref 8.4–10.5)
Chloride: 99 mEq/L (ref 96–112)
GFR: 63.45 mL/min (ref >60.00)
GLUCOSE: 185 mg/dL — AB (ref 70–99)
Potassium: 4.7 mEq/L (ref 3.5–5.1)
SODIUM: 135 meq/L (ref 135–145)
TOTAL PROTEIN: 6.1 g/dL (ref 6.0–8.3)
Total Bilirubin: 0.6 mg/dL (ref 0.2–1.2)

## 2015-05-30 NOTE — Patient Instructions (Signed)
Labs today.  We will set up evaluation with Dr. Harl Bowie in neurosurgery.

## 2015-05-30 NOTE — Assessment & Plan Note (Signed)
BG poorly controlled. Encouraged better compliance with diet. Given recent symptomatic lows with U500, will reduce dose to 50units tidac. Follow up in 2 weeks.

## 2015-05-30 NOTE — Assessment & Plan Note (Signed)
Severe lumbar radicular pain. Reviewed notes from hospitalization and myelogram. Pain management referral pending. Will set up second opinion with [facility]. Continue prn Oxycocone, Gabapentin and Cymbalta. Question if he might benefit from nerve block and or longer acting pain medication.

## 2015-05-30 NOTE — Progress Notes (Signed)
Subjective:    Patient ID: Steve Phillips, male    DOB: 23-Jun-1946, 69 y.o.   MRN: [id number]  HPI 69YO male presents for follow up with his wife, son and daughter.  ADMITTED 05/13/2015 DISCHARGED 05/19/2015  Diagnosis: Lumbar cord compression, intractable pain. Currently living at [facility].  During hospitalization, stopped Metformin and Hydrocodone. Stopped Lyrica. Started on Gabapentin and Lantus. Started on Oxycodone for pain. Then, sent to [facility] for rehab. Stopped Lantus and started Insulin U500.  Pain is poorly controlled. Taking Oxycodone 5-10mg  every 4 hours with minimal improvement. At present, rated 4/10, aching in lower back, burning in left lower leg. Left leg is weak. He is unable to walk. He is completing PT at [facility]. Dr. Retta Mac, pain management specialist, appointment scheduled 7/14. Family would like to get second opinion from neurosurgery.  BG have been elevated. Started back on U500 insulin. Taking 60 with breakfast and lunch and 75units with dinner. Having some symptomatic low blood sugars in the 80s. However, son reports that family continues to bring him sweets including candy at rehab.  Continues to have bowel incontinence.  Past medical, surgical, family and social history per today's encounter.  Review of Systems  Constitutional: Negative for fever, chills, activity change, appetite change, fatigue and unexpected weight change.  Eyes: Negative for visual disturbance.  Respiratory: Negative for cough and shortness of breath.   Cardiovascular: Negative for chest pain, palpitations and leg swelling.  Gastrointestinal: Negative for nausea, vomiting, abdominal pain, diarrhea, constipation and abdominal distention.  Genitourinary: Negative for dysuria, urgency and difficulty urinating.  Musculoskeletal: Positive for myalgias, arthralgias and gait problem.  Skin: Negative for color change and rash.  Neurological: Positive for tremors, weakness and  numbness.  Hematological: Negative for adenopathy.  Psychiatric/Behavioral: Positive for sleep disturbance and dysphoric mood. Negative for suicidal ideas. The patient is not nervous/anxious.        Objective:    BP 138/77 mmHg  Pulse 59  Temp(Src) 98.4 F (36.9 C) (Oral)  Wt   SpO2 97% Physical Exam  Constitutional: He is oriented to person, place, and time. He appears well-developed and well-nourished. No distress.  HENT:  Head: Normocephalic and atraumatic.  Right Ear: External ear normal.  Left Ear: External ear normal.  Nose: Nose normal.  Mouth/Throat: Oropharynx is clear and moist. No oropharyngeal exudate.  Eyes: Conjunctivae and EOM are normal. Pupils are equal, round, and reactive to light. Right eye exhibits no discharge. Left eye exhibits no discharge. No scleral icterus.  Neck: Normal range of motion. Neck supple. No tracheal deviation present. No thyromegaly present.  Cardiovascular: Normal rate, regular rhythm and normal heart sounds.  Exam reveals no gallop and no friction rub.   No murmur heard. Pulmonary/Chest: Effort normal and breath sounds normal. No accessory muscle usage. No tachypnea. No respiratory distress. He has no decreased breath sounds. He has no wheezes. He has no rhonchi. He has no rales. He exhibits no tenderness.  Musculoskeletal: Normal range of motion. He exhibits no edema.  Lymphadenopathy:    He has no cervical adenopathy.  Neurological: He is alert and oriented to person, place, and time. He displays no atrophy. A sensory deficit (left lower leg decreased sensation to light touch) is present. No cranial nerve deficit. He exhibits normal muscle tone. Coordination and gait abnormal.  Left foot plantar flexion and extension 4/5  Skin: Skin is warm and dry. No rash noted. He is not diaphoretic. No erythema. No pallor.  Psychiatric: He has a normal  mood and affect. His behavior is normal. Judgment and thought content normal.            Assessment & Plan:  Over 60min of which >50% spent in face-to-face contact with patient discussing plan of care  Problem List Items Addressed This Visit      High   DM (diabetes mellitus), type 2 with neurological complications (Chronic)    BG poorly controlled. Encouraged better compliance with diet. Given recent symptomatic lows with U500, will reduce dose to 50units tidac. Follow up in 2 weeks.      Hypertension (Chronic)    BP Readings from Last 3 Encounters:  05/30/15 138/77  05/19/15 151/67  05/12/15 138/60   BP well controlled. Recheck renal function with labs today.      Relevant Orders   Comprehensive metabolic panel     Unprioritized   Lumbar radicular pain - Primary    Severe lumbar radicular pain. Reviewed notes from hospitalization and myelogram. Pain management referral pending. Will set up second opinion with [facility]. Continue prn Oxycocone, Gabapentin and Cymbalta. Question if he might benefit from nerve block and or longer acting pain medication.      Relevant Orders   Ambulatory referral to Neurosurgery       Return in about 2 weeks (around 06/13/2015) for Recheck.

## 2015-05-30 NOTE — Progress Notes (Signed)
Pre visit review using our clinic review tool, if applicable. No additional management support is needed unless otherwise documented below in the visit note. 

## 2015-05-30 NOTE — Assessment & Plan Note (Signed)
BP Readings from Last 3 Encounters:  05/30/15 138/77  05/19/15 151/67  05/12/15 138/60   BP well controlled. Recheck renal function with labs today.

## 2015-05-31 ENCOUNTER — Telehealth: Payer: Self-pay | Admitting: Internal Medicine

## 2015-05-31 ENCOUNTER — Encounter: Payer: Self-pay | Admitting: Internal Medicine

## 2015-05-31 LAB — GLUCOSE, CAPILLARY: GLUCOSE-CAPILLARY: 284 mg/dL — AB (ref 65–99)

## 2015-05-31 NOTE — Telephone Encounter (Signed)
Nurse called to speak with Steve Phillips regarding the patient   Please check your voicemail as he wanted to leave you a detailed message    Thank you

## 2015-05-31 NOTE — Telephone Encounter (Signed)
Returned call to Olivia Mackie, nurse at [facility]. He stated that a couple of nights ago, pt began having nausea and vomiting within 1 hr after having the R-U500. Pt also had a low sugar level of 86 and felt really bad. Pt saw Dr Gilford Rile and she decreased the insulin to 50 units TID-AC and 0 at bedtime. Pt's blood sugar was 407 this morning and 420 after lunch. Please advise.

## 2015-05-31 NOTE — Telephone Encounter (Signed)
I agree with the change, of taking 50 units U500 3 times a day, before meals. He should not have any U500 insulin at bedtime, I hope he was not given this before his visit with Dr. Gilford Rile... Let's give this new dose a couple more days to see how it works, since the the change was only made yesterday.

## 2015-05-31 NOTE — Telephone Encounter (Signed)
Rennis Golden, nurse and advised him per Dr Arman Filter message below. He voiced understanding and said he will contact us on Friday to let us know how his sugar readings are. Be advised.

## 2015-06-02 DIAGNOSIS — M5442 Lumbago with sciatica, left side: Secondary | ICD-10-CM | POA: Diagnosis not present

## 2015-06-02 DIAGNOSIS — R29898 Other symptoms and signs involving the musculoskeletal system: Secondary | ICD-10-CM | POA: Diagnosis not present

## 2015-06-03 DIAGNOSIS — M5136 Other intervertebral disc degeneration, lumbar region: Secondary | ICD-10-CM | POA: Diagnosis not present

## 2015-06-03 DIAGNOSIS — M5416 Radiculopathy, lumbar region: Secondary | ICD-10-CM | POA: Diagnosis not present

## 2015-06-09 ENCOUNTER — Ambulatory Visit (INDEPENDENT_AMBULATORY_CARE_PROVIDER_SITE_OTHER): Payer: Medicare Other | Admitting: Internal Medicine

## 2015-06-09 ENCOUNTER — Encounter: Payer: Self-pay | Admitting: Internal Medicine

## 2015-06-09 VITALS — BP 118/64 | HR 64 | Temp 97.7°F | Resp 14 | Wt 257.0 lb

## 2015-06-09 DIAGNOSIS — E1149 Type 2 diabetes mellitus with other diabetic neurological complication: Secondary | ICD-10-CM

## 2015-06-09 DIAGNOSIS — I200 Unstable angina: Secondary | ICD-10-CM

## 2015-06-09 DIAGNOSIS — E1140 Type 2 diabetes mellitus with diabetic neuropathy, unspecified: Secondary | ICD-10-CM

## 2015-06-09 MED ORDER — METFORMIN HCL 1000 MG PO TABS: 1000.0000 mg | ORAL_TABLET | Freq: Two times a day (BID) | ORAL | Status: DC

## 2015-06-09 MED ORDER — INSULIN REGULAR HUMAN (CONC) 500 UNIT/ML ~~LOC~~ SOPN: 65.0000 [IU] | PEN_INJECTOR | Freq: Three times a day (TID) | SUBCUTANEOUS | Status: DC

## 2015-06-09 NOTE — Progress Notes (Signed)
Patient ID: Steve Phillips, male   DOB: 17-Jun-1946, 69 y.o.   MRN: [id number]  HPI: Steve Phillips is a 69 y.o.-year-old male, returning for f/u for DM2, dx 1990's, insulin-dependent, uncontrolled, with complications (CAD, PN, CKD). Last visit 2 mo ago. He is here with his wife who offers part of the hx.  He is in [facility] for rehab. Sugars were uncontrolled inhouse >> we restarted U500 insulin, now on 65 units 3x a day. He had a myelogram to identify the source of his ?neuropathic pain >> no abnormality other than bulging disks. He will have a EMG.  He was on Decadron inhouse (he is off prednisone) >> CBG were 500s.   Last hemoglobin A1c was: Lab Results  Component Value Date   HGBA1C 9.1* 05/06/2015   HGBA1C 9.1* 02/16/2015   HGBA1C 9.7* 11/16/2014  Was on Prednisone and Kenalog injections.  He was taking: - Toujeo 70 units 2x a day  - NovoLog: 50-55 units with a meal - NovoLog Sliding Scale:  - 150- 165: + 1 unit  - 166- 180: + 2 units  - 181- 195: + 3 units  - 196- 210: + 4 units  - 211- 225: + 5 units  - > 225: + 6 units   Now on U500 insulin: - 65 units before each meal.  Pt checks his sugars 2-3x a day and they are - brings log - much higher on steroids, now improving on U500: - am: 98-150s, but some 200-276 ~ eating at night >> 102-242 >> 69-145, 186 >> 112-313 >> 160, 237 - 2h after b'fast: 216-289 >> n/c >> 260-318 >> 119, 183-250 >> 222-298, 416 >> n/c - before lunch: 211-227 >> forgot >> 237 >> 165-231 >> 204-244 >> n/c >> 165 >> 286 - 2h after lunch:313 >> 126 >> 251, 356 >> 49x1 (took insulin w/o eating), 73-248 >> n/c - before dinner: 161 >> forgot >> 121, 216-246 >> 58 -185 >> 154 >> 119, 238 >> 214 >> 292 - 2h after dinner: 199, 235, 284 >> 145-158, 299x1 >> 201-330 >> 109-273 >> 226-361, 499 >> n/c - bedtime: 256, 307 >> 54x1, 81-119 >> 167-257 >> 104-298 >>> 145-278 >> n/c + lows - lowest 53; he has hypoglycemia awareness at 70.  Highest sugar was 297 >>  499 >> 600s with steroids.  Pt's meals are - but he eats constantly! - Breakfast: 2 eggs, sausage, toast, coffee - Lunch: sandwich or leftovers - Dinner: meat + veggies + bread, tea or milk - Snacks: pm and late at night  - + mild CKD, last BUN/creatinine:  Lab Results  Component Value Date   BUN 23 05/30/2015   CREATININE 1.20 05/30/2015  On Lisinopril.  - last set of lipids: Lab Results  Component Value Date   CHOL 94 03/05/2015   HDL 30* 03/05/2015   LDLCALC 50 03/05/2015   LDLDIRECT 116.0 11/16/2014   TRIG 70 03/05/2015   CHOLHDL 5 11/16/2014  On Crestor. - last eye exam was 01/28/2014 ([name]). No DR.  - + no sensation in legs below knee. He sees his podiatrist q 3 mo ([name]). Foot exam performed 04/2015.  I reviewed pt's medications, allergies, PMH, social hx, family hx, and changes were documented in the history of present illness. Otherwise, unchanged from my initial visit note.  He had a stroke and Bells' palsy in 05/2014 >> in the hospital >> started on steroids 50 mg x 4 days, then  tapering >> sugars higher after this.   ROS: Constitutional: no weight gain/loss, no increased appetite, no fatigue, no subjective hypo/hyperthermia, + poor sleep Eyes:no blurry vision, no xerophthalmia ENT: no sore throat, no nodules palpated in throat, no dysphagia/odynophagia, no hoarseness, + hypoacusis Cardiovascular: no CP/no SOB/palpitations/leg swelling Respiratory: no cough/SOB Gastrointestinal: no N/V/D/C Musculoskeletal: + muscle aches/+ joint aches/+ back pain; + left leg pain -severe  Skin: no rashes Neurological: no tremors/+ numbness - up to knees/tingling/dizziness  PE: BP 118/64 mmHg  Pulse 64  Temp(Src) 97.7 F (36.5 C) (Oral)  Resp 14  Wt 257 lb (116.574 kg)  SpO2 97% Body mass index is 34.85 kg/(m^2). Wt Readings from Last 3 Encounters:  06/09/15 257 lb (116.574 kg)  05/12/15 260 lb (117.935 kg)  05/12/15 260 lb (117.935 kg)    Constitutional: overweight, in NAD, walks with walker Eyes: PERRLA, EOMI, no exophthalmos ENT: moist mucous membranes, no thyromegaly, no cervical lymphadenopathy Cardiovascular: RRR, No MRG, + leg swelling bilaterally - pitting Respiratory: CTA B Gastrointestinal: abdomen soft, NT, ND, BS+ Musculoskeletal: no deformities Skin: moist, warm, no rashes  ASSESSMENT: 1. DM2, insulin-dependent, uncontrolled, with complications - CAD, s/p multiple PCI with stent RCA,LAD and obtuse marginal, followed @ [facility] on the accord study.., cath 02/2012 90% D1, s/p drug-eluting stent - Dr. Fletcher [name] - Peripheral neuropathy - CKD, mild  - tried U500 >> nausea He was also in the Accord Study for 8 years.   PLAN:  1. Patient with long-standing, uncontrolled, diabetes, now back on U500 KwikPen insulin. Sugars have been very high in the setting of steroid use. They bring her sugar log from the facility with the values obtained after we increased the dose of U5 100-65 units 3 times a day. The sugars remain high, especially as the day goes by. We will increase the morning and lunchtime dose to 75 units. We'll also restart metformin after review of his BMP from 05/30/2015. - I suggested to:  Patient Instructions  Please increase U500 insulin (pen dose): - before b'fast: 75 units  - before lunch: 75 units - before dinner: 65 units  Restart Metformin 500 mg 2x a day with meals x 5 days, then increase to 1000 mg 2x a day with meals.  Please return in 1.5 month with your sugar log.   - continue checking sugars at different times of the day - check 3 times a day, rotating checks - Needs a new eye exam

## 2015-06-09 NOTE — Patient Instructions (Signed)
Please increase U500 insulin: - before b'fast: 75 units  - before lunch: 75 units - before dinner: 65 units  Restart Metformin 500 mg 2x a day with meals x 5 days, then increase to 1000 mg 2x a day with meals.  Please return in 1.5 month with your sugar log.

## 2015-06-10 ENCOUNTER — Ambulatory Visit: Payer: Medicare Other | Admitting: Internal Medicine

## 2015-06-14 DIAGNOSIS — E782 Mixed hyperlipidemia: Secondary | ICD-10-CM | POA: Diagnosis not present

## 2015-06-14 DIAGNOSIS — I10 Essential (primary) hypertension: Secondary | ICD-10-CM | POA: Diagnosis not present

## 2015-06-14 DIAGNOSIS — E1165 Type 2 diabetes mellitus with hyperglycemia: Secondary | ICD-10-CM | POA: Diagnosis not present

## 2015-06-14 DIAGNOSIS — M545 Low back pain: Secondary | ICD-10-CM | POA: Diagnosis not present

## 2015-06-14 DIAGNOSIS — R609 Edema, unspecified: Secondary | ICD-10-CM | POA: Diagnosis not present

## 2015-06-14 DIAGNOSIS — G20 Parkinson's disease: Secondary | ICD-10-CM | POA: Diagnosis not present

## 2015-06-14 DIAGNOSIS — D519 Vitamin B12 deficiency anemia, unspecified: Secondary | ICD-10-CM | POA: Diagnosis not present

## 2015-06-14 DIAGNOSIS — I2510 Atherosclerotic heart disease of native coronary artery without angina pectoris: Secondary | ICD-10-CM | POA: Diagnosis not present

## 2015-06-17 DIAGNOSIS — G629 Polyneuropathy, unspecified: Secondary | ICD-10-CM | POA: Diagnosis not present

## 2015-06-21 ENCOUNTER — Telehealth: Payer: Self-pay | Admitting: *Deleted

## 2015-06-21 NOTE — Telephone Encounter (Signed)
Faxed note to [facility] at [phone]).

## 2015-06-21 NOTE — Telephone Encounter (Signed)
Olivia Mackie, RN at [facility] called stating that the Steve Phillips's nausea and vomiting began after the Metformin increase and change to U500. Steve Phillips has had a low grade temp and not feeling well. He wanted Dr Cruzita Lederer to know that his blood sugars have been between 180 and 250. But Steve Phillips's diet has not been good either. He has ate biscuits, little debbie cookies and drank coke. He stated he discussed this with him and his wife. He understands that he wants some quality of life, but advised Steve Phillips that the diet is what effects his sugar levels. He wants Dr Cruzita Lederer to be aware of these things. Advise to any medication changes via fax [phone] - Attn: Nurse E-Hall).

## 2015-06-21 NOTE — Telephone Encounter (Signed)
Sorry to hear that. Since the nausea could be associated with metformin, let's stop this and continued the U500 for now please let me know how he is feeling and how his sugars are doing in the next few days.

## 2015-06-21 NOTE — Telephone Encounter (Signed)
Pt's wife called stating that pt has had nausea and vomiting for the past 4 to 5 days (since beginning the U500). Pt's sugar levels are better and even had a few lows. Please advise of a change of insulin. Pt does not feel that he can take this any longer.

## 2015-06-22 ENCOUNTER — Telehealth: Payer: Self-pay | Admitting: *Deleted

## 2015-06-22 NOTE — Telephone Encounter (Signed)
Opened encounter in error  

## 2015-06-22 NOTE — Telephone Encounter (Signed)
Dr Cruzita Lederer called and advised for pt to D/C U500. Do not restart metformin. Restart Novolog: Inject 45 units with a smaller meal, 50 units with a larger meal; 3x qd. Plus sliding scale: - 150- 165: + 1 unit  - 166- 180: + 2 units  - 181- 195: + 3 units  - 196- 210: + 4 units - - 211- 225: + 5 units  - > 225: + 6 units Restart Lantus: inject 55 units at 8 am and 10 pm. Order faxed to [employer], Conservation officer, historic buildings at [place].

## 2015-06-22 NOTE — Telephone Encounter (Signed)
Anderson Malta, RN from [facility] called stating that they received the orders to d/c the Metformin. Pt has continued to feel nauseated. Last night pt would not eat dinner, his blood sugar before bed was 170. This morning, nausea has continued, fasting b/g was 205. Vomiting began about 45 min after taking insulin. His b/g was 322 before lunch. Pt continues to feel nauseated and does not want to eat. Please advise.

## 2015-06-23 DIAGNOSIS — M5416 Radiculopathy, lumbar region: Secondary | ICD-10-CM | POA: Diagnosis not present

## 2015-06-23 DIAGNOSIS — M5136 Other intervertebral disc degeneration, lumbar region: Secondary | ICD-10-CM | POA: Diagnosis not present

## 2015-06-23 DIAGNOSIS — M7541 Impingement syndrome of right shoulder: Secondary | ICD-10-CM | POA: Diagnosis not present

## 2015-07-03 ENCOUNTER — Encounter: Payer: Self-pay | Admitting: Internal Medicine

## 2015-07-05 ENCOUNTER — Encounter: Payer: Self-pay | Admitting: Internal Medicine

## 2015-07-05 ENCOUNTER — Telehealth: Payer: Self-pay | Admitting: Internal Medicine

## 2015-07-05 ENCOUNTER — Emergency Department
Admission: EM | Admit: 2015-07-05 | Discharge: 2015-07-05 | Disposition: A | Discharge status: Home / Self Care | Payer: Medicare Other

## 2015-07-05 ENCOUNTER — Ambulatory Visit (INDEPENDENT_AMBULATORY_CARE_PROVIDER_SITE_OTHER): Payer: Medicare Other | Admitting: Internal Medicine

## 2015-07-05 VITALS — BP 89/57 | HR 65 | Temp 98.2°F | Ht 72.0 in

## 2015-07-05 DIAGNOSIS — E1140 Type 2 diabetes mellitus with diabetic neuropathy, unspecified: Secondary | ICD-10-CM

## 2015-07-05 DIAGNOSIS — R112 Nausea with vomiting, unspecified: Secondary | ICD-10-CM | POA: Diagnosis not present

## 2015-07-05 DIAGNOSIS — I10 Essential (primary) hypertension: Secondary | ICD-10-CM | POA: Diagnosis not present

## 2015-07-05 DIAGNOSIS — I200 Unstable angina: Secondary | ICD-10-CM | POA: Diagnosis not present

## 2015-07-05 DIAGNOSIS — E1149 Type 2 diabetes mellitus with other diabetic neurological complication: Secondary | ICD-10-CM

## 2015-07-05 DIAGNOSIS — R5383 Other fatigue: Secondary | ICD-10-CM

## 2015-07-05 DIAGNOSIS — R509 Fever, unspecified: Secondary | ICD-10-CM | POA: Diagnosis not present

## 2015-07-05 DIAGNOSIS — R531 Weakness: Secondary | ICD-10-CM | POA: Diagnosis not present

## 2015-07-05 NOTE — Telephone Encounter (Signed)
[facility] called stated that they need to talk to you in reference to patient.  He stated that Novalog make him severely sick, please advise  [phone]

## 2015-07-05 NOTE — Assessment & Plan Note (Signed)
Nausea with vomiting. Symptoms concerning for septic shock. To [facility] ED via EMS for IVF, evaluation.

## 2015-07-05 NOTE — Patient Instructions (Signed)
To ER with EMS.

## 2015-07-05 NOTE — Progress Notes (Signed)
Pre visit review using our clinic review tool, if applicable. No additional management support is needed unless otherwise documented below in the visit note. 

## 2015-07-05 NOTE — Assessment & Plan Note (Signed)
Pt is pale grey and lethargic. Actively vomiting. Hypotensive. EKG by EMS unchanged from previous. Symptoms are concerning for CAD with MI versus sepsis. Pt will be transported by EMS to [facility] ED for evaluation.

## 2015-07-05 NOTE — Assessment & Plan Note (Signed)
BP Readings from Last 3 Encounters:  07/05/15 89/57  06/09/15 118/64  05/30/15 138/77   Hypotensive today, which is unusual for him. Given recent fevers and episodes of unresponsiveness, question if he may be septic versus having acute worsening of CHF. EMS will transport to [facility] ED for further evaluation.

## 2015-07-05 NOTE — Progress Notes (Signed)
Subjective:    Patient ID: Steve Phillips, male    DOB: 07/15/1946, 69 y.o.   MRN: [id number]  HPI  69YO male presents for follow up.  Doing poorly. Fatigued. Poor appetite. Fever 9F at SNF.  Nausea with use of Novolog. Having low BG in 40-50s.  Episode this weekend during which he became unresponsive, appeared pale. He does not remember this event. Lasted several minutes. He did not seek treatment for this.  BG mostly near 200s by report, however son reports he has been given D50 on two occasions for low BG. Receiving Lantus and Novolog 45units with meals as well as sliding scale.  Denies chest pain, dyspnea. Reports some persistent nausea. Receiving Phenergan at facility with no improvement.    Past Medical History  Diagnosis Date  . Shingles   . Hyperlipidemia   . Lower leg pain     chronic ,left leg  . Acute angina   . Depression   . CAD (coronary artery disease)     s/p multiple PCI with stent RCA,LAD and obtuse marginal,followed @ [facility] on the accord study.., cath 02/2012 90% D1, s/p drug-eluting stent Dr. Fletcher [name]  . Shortness of breath   . Hypertension     dr Jerilynn Mages [facility]     [facility] in [place]  . Bell's palsy   . Left leg pain   . 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 Type II diabetes mellitus   . GERD (gastroesophageal reflux disease)   . Headache(784.0)     "sometimes daily; sometimes weekly" (05/16/2015)  . Stroke 2015    denies residual on 05/16/2015  . Chronic lower back pain     "for 3 months now" (05/16/2015)  . Diabetic peripheral neuropathy     "feet"  . Falls frequently     "in the last month or so" (05/16/2015)  . Vascular parkinsonism dx'd 10/2014   Family History  Problem Relation Age of Onset  . Cancer Mother     BREAST  . Heart disease Mother     STENT  . Cancer Father     THROAT  . Heart disease Sister     STENT; HTN  . Heart attack Brother   . Hypertension Brother   . Hypertension Sister    Past Surgical History  Procedure  Laterality Date  . Appendectomy    . Tibia fracture surgery Left ~ 2002  . Foot fracture surgery Left ~ 2002    "Jone's fracture"  . Testicle surgery Left 2000's    "put drainage tube in for infection"  . Back surgery    . Anterior cervical decomp/discectomy fusion N/A 04/30/2013    Procedure: ANTERIOR CERVICAL DECOMPRESSION/DISCECTOMY FUSION 2 LEVELS;  Surgeon: Faythe Ghee, MD;  Location: [facility] NEURO ORS;  Service: Neurosurgery;  Laterality: N/A;  Cervical four-five, Cervical six-seven anterior cervical decompression fusion with trabecular metal cage and plate  . Left heart catheterization with coronary angiogram N/A 02/23/2015    Procedure: LEFT HEART CATHETERIZATION WITH CORONARY ANGIOGRAM;  Surgeon: [name], MD;  Location: [facility] CATH LAB;  Service: Cardiovascular;  Laterality: N/A;  . Cholecystectomy open  1980's  . Cardiac catheterization  02/2012  . Coronary angioplasty with stent placement  ~ 2000-02/2015    "he has a total of 6 stents" (05/16/2015  . Fracture surgery    . Squamous cell carcinoma excision Left 2014    "arm"   Social History   Social History  . Marital  Status: Married    Spouse Name: N/A  . Number of Children: N/A  . Years of Education: N/A   Social History Main Topics  . Smoking status: Former Smoker -- 0.12 packs/day for 10 years    Types: Cigarettes    Quit date: 11/08/1971  . Smokeless tobacco: Never Used  . Alcohol Use: No  . Drug Use: No  . Sexual Activity: Not Currently   Other Topics Concern  . None   Social History Narrative   Used to work with [employer] until the 1990s and then became disabled and progressively worse   Currently lives with his wife for 23 years   States he wishes to be a full code    Review of Systems  Constitutional: Positive for fever, chills, diaphoresis, activity change, appetite change and fatigue. Negative for unexpected weight change.  Eyes: Negative for visual disturbance.  Respiratory: Negative for  cough and shortness of breath.   Cardiovascular: Negative for chest pain, palpitations and leg swelling.  Gastrointestinal: Positive for nausea and vomiting. Negative for abdominal pain, diarrhea, constipation, blood in stool and abdominal distention.  Genitourinary: Negative for dysuria, urgency and difficulty urinating.  Musculoskeletal: Negative for arthralgias and gait problem.  Skin: Positive for color change and wound. Negative for rash.  Neurological: Positive for weakness, light-headedness and numbness.  Hematological: Negative for adenopathy.  Psychiatric/Behavioral: Negative for sleep disturbance and dysphoric mood. The patient is not nervous/anxious.        Objective:    BP 89/57 mmHg  Pulse 65  Temp(Src) 98.2 F (36.8 C) (Oral)  Ht 6' (1.829 m)  Wt   SpO2 96% Physical Exam  Constitutional: He is oriented to person, place, and time. He appears well-developed and well-nourished. He appears lethargic. He appears ill. No distress.  HENT:  Head: Normocephalic and atraumatic.  Right Ear: External ear normal.  Left Ear: External ear normal.  Nose: Nose normal.  Mouth/Throat: Oropharynx is clear and moist. No oropharyngeal exudate.  Eyes: Conjunctivae and EOM are normal. Pupils are equal, round, and reactive to light. Right eye exhibits no discharge. Left eye exhibits no discharge. No scleral icterus.  Neck: Normal range of motion. Neck supple. No tracheal deviation present. No thyromegaly present.  Cardiovascular: Normal rate and regular rhythm.  Exam reveals distant heart sounds. Exam reveals no gallop and no friction rub.   No murmur heard. Pulmonary/Chest: Effort normal and breath sounds normal. No accessory muscle usage. No tachypnea. No respiratory distress. He has no decreased breath sounds. He has no wheezes. He has no rhonchi. He has no rales. He exhibits no tenderness.  Musculoskeletal: Normal range of motion. He exhibits no edema.  Lymphadenopathy:    He has no  cervical adenopathy.  Neurological: He is oriented to person, place, and time. He appears lethargic. No cranial nerve deficit. Coordination normal.  Skin: Skin is warm. No rash noted. He is diaphoretic. No erythema. There is pallor.  Psychiatric: He has a normal mood and affect. His behavior is normal. Judgment and thought content normal.          Assessment & Plan:   Problem List Items Addressed This Visit      High   DM (diabetes mellitus), type 2 with neurological complications (Chronic)   Relevant Medications   insulin glargine (LANTUS) 100 UNIT/ML injection   insulin regular (NOVOLIN R,HUMULIN R) 100 units/mL injection   Hypertension (Chronic)    BP Readings from Last 3 Encounters:  07/05/15 89/57  06/09/15 118/64  05/30/15 138/77   Hypotensive today, which is unusual for him. Given recent fevers and episodes of unresponsiveness, question if he may be septic versus having acute worsening of CHF. EMS will transport to [facility] ED for further evaluation.        Unprioritized   Lethargy - Primary    Pt is pale grey and lethargic. Actively vomiting. Hypotensive. EKG by EMS unchanged from previous. Symptoms are concerning for CAD with MI versus sepsis. Pt will be transported by EMS to [facility] ED for evaluation.      Nausea with vomiting    Nausea with vomiting. Symptoms concerning for septic shock. To [facility] ED via EMS for IVF, evaluation.          No Follow-up on file.

## 2015-07-05 NOTE — Telephone Encounter (Signed)
Steve Phillips at [facility] to advise him that pt has been sent to the ED. He understood and will let the next shift know this.

## 2015-07-05 NOTE — Telephone Encounter (Signed)
He saw PCP >> sent to ED today. Will see what the results of the evaluation is. I am not sure what is going on but I suspect that there is an underlying process that influences his sugars, rather the actual insulin type. We may need to switch to NPH and R insulin after he is discharges, these are better tolerated.

## 2015-07-05 NOTE — Telephone Encounter (Signed)
Called and spoke with Olivia Mackie, RN at [facility]. He stated that he began feeling bad last week. All began last Tuesday, after having the 45 units of Novolog. About 30-45 min later, pt began feeling sick. His blood sugar was 192. Wed no Novolog, pt went out with family, came back and said he felt fine. Thurs the same thing. Friday, takes Novolog, sick all day. Sat no Novolog, did not get sick. On Sunday, no Novolog in the AM, went out with family, came back had 45 units of Novolog at lunch. 30 min later, pt begans to feel sick. Pt's appetite is not good. Not eating well. This morning, pt stated he did not feel good, pt did not have Novolog. Pt seeing Dr Gilford Rile today. Please advise.

## 2015-07-06 ENCOUNTER — Encounter: Payer: Self-pay | Admitting: Internal Medicine

## 2015-07-06 ENCOUNTER — Telehealth: Payer: Self-pay | Admitting: Internal Medicine

## 2015-07-06 ENCOUNTER — Encounter: Payer: Self-pay | Admitting: Cardiovascular Disease

## 2015-07-06 NOTE — Telephone Encounter (Signed)
FYI

## 2015-07-06 NOTE — Telephone Encounter (Signed)
Patient Name: BRALLAN DENIO DOB: [birth year] Initial Comment Caller states patient c/o nausea, vomiting, sweating, pale CBN: [phone] Nurse Assessment Nurse: Ronnald Ramp, RN, Miranda Date/Time (Eastern Time): 07/06/2015 1:59:36 PM Confirm and document reason for call. If symptomatic, describe symptoms. ---Caller states pt started having nausea, vomiting, paleness, and sweating. Seen in ED and had labs and given IVF and sent home. Still not feeling well today BS 192, temp 99, HR 74, BP 102/56 today. Then he had spells of diaphoresis and not responsive but that comes and goes and is not totally new for him. Talked to Harleigh at the office. There are notes from the ER that the MD at the rehab facility was to see him and adjust the meds. She stated that Dr. Gilford Rile did not feel he should have been sent back from the ED. Caller states that the doctor at the facility is only there on Tuesdays. Told caller that if the MD there cannot evaluate him, to send pt back to the ED. Has the patient traveled out of the country within the last 30 days? ---Not Applicable Does the patient require triage? ---No Guidelines Guideline Title Affirmed Question Affirmed Notes Final Disposition User Clinical Call Ronnald Ramp, RN, [employer]

## 2015-07-07 ENCOUNTER — Telehealth: Payer: Self-pay

## 2015-07-07 NOTE — Telephone Encounter (Signed)
S/w Olivia Mackie at [facility]. States they drew labs and sent stat but no results yet. Reported pt BP 140s/90s this afternoon. Will call back tomorrow for results

## 2015-07-07 NOTE — Telephone Encounter (Signed)
S/w Sanda Linger, RN at [facility]. Per Olivia Mackie, pt experiencing decreased appetite, nausea, BS elevated 150-300 range. Pt had OV 8/16 with [name], MD. Sent from office via EMS to [facility] ED. Per Olivia Mackie, pt wife stated pt was not going to sit at [facility] waiting to be seen and transported pt by personal vehicle to [place]. Indicates labs drawn and pt given IVF and sent back to [facility].  Olivia Mackie states pt has been orthostatics positive. Pt has not had BP meds this morning. Reports BP 134/75 Reviewed Dr. Tyrell Antonio recommendations regarding medication adjustment and needed labs. Olivia Mackie states they should be able to draw labs and will try to get results today. Stressed urgency of results today. Requests orders to be faxed  Faxed orders to [phone]

## 2015-07-07 NOTE — Telephone Encounter (Signed)
S/w pt wife Horris Latino regarding pt need for labs today. Per Horris Latino, pt is unable to walk and they are using lift to get patient up. Will contact [facility] to inquire if they can draw labs.

## 2015-07-08 ENCOUNTER — Telehealth: Payer: Self-pay

## 2015-07-08 NOTE — Telephone Encounter (Signed)
Lab results received via fax and given to [facility].

## 2015-07-08 NOTE — Telephone Encounter (Signed)
S/w Anderson Malta at [facility] they have lab results and will fax to [place] now.

## 2015-07-11 DIAGNOSIS — I10 Essential (primary) hypertension: Secondary | ICD-10-CM | POA: Diagnosis not present

## 2015-07-11 DIAGNOSIS — E782 Mixed hyperlipidemia: Secondary | ICD-10-CM | POA: Diagnosis not present

## 2015-07-11 DIAGNOSIS — E1165 Type 2 diabetes mellitus with hyperglycemia: Secondary | ICD-10-CM | POA: Diagnosis not present

## 2015-07-11 DIAGNOSIS — G20 Parkinson's disease: Secondary | ICD-10-CM | POA: Diagnosis not present

## 2015-07-11 DIAGNOSIS — E860 Dehydration: Secondary | ICD-10-CM | POA: Diagnosis not present

## 2015-07-11 DIAGNOSIS — D509 Iron deficiency anemia, unspecified: Secondary | ICD-10-CM | POA: Diagnosis not present

## 2015-07-11 DIAGNOSIS — M545 Low back pain: Secondary | ICD-10-CM | POA: Diagnosis not present

## 2015-07-11 DIAGNOSIS — I2510 Atherosclerotic heart disease of native coronary artery without angina pectoris: Secondary | ICD-10-CM | POA: Diagnosis not present

## 2015-07-15 ENCOUNTER — Telehealth: Payer: Self-pay | Admitting: *Deleted

## 2015-07-15 NOTE — Telephone Encounter (Signed)
Steve Phillips called states she has been unsuccessful in reaching the patient at home to start home health.  I called Steve Phillips, spoke with Steve Phillips the discharge social worker, she states she believes pt went to the beach with his family on Wed after his discharge.  Relayed that information back to [place].

## 2015-07-19 ENCOUNTER — Encounter: Payer: Self-pay | Admitting: Internal Medicine

## 2015-07-20 ENCOUNTER — Telehealth: Payer: Self-pay | Admitting: *Deleted

## 2015-07-20 ENCOUNTER — Telehealth: Payer: Self-pay | Admitting: Internal Medicine

## 2015-07-20 NOTE — Telephone Encounter (Signed)
Steve Mackie, RN with [facility] called and lvm. Pt has been released to go home. Pt refusing to take insulin due to it causing him to be so sick (nasuea and vomiting). Be advised.

## 2015-07-20 NOTE — Telephone Encounter (Signed)
Pt and his wife came in on 07/20/15 for an appointment at 3pm. There was no appointment for him.  I offered them other appointment days and time but they refused. I asked them could they please hold on for a minute but they started to walk out the door. Went outside to see if they would like to see another Doctor, Mrs. Vidales said that they only wanted to see Dr. Gilford Rile. There was no documention on file about him being seen at 3pm on 07/20/15

## 2015-07-21 ENCOUNTER — Encounter: Payer: Self-pay | Admitting: Internal Medicine

## 2015-07-21 ENCOUNTER — Telehealth: Payer: Self-pay | Admitting: *Deleted

## 2015-07-21 ENCOUNTER — Other Ambulatory Visit: Payer: Self-pay | Admitting: *Deleted

## 2015-07-21 ENCOUNTER — Ambulatory Visit (INDEPENDENT_AMBULATORY_CARE_PROVIDER_SITE_OTHER): Payer: Medicare Other | Admitting: Internal Medicine

## 2015-07-21 VITALS — BP 162/82 | HR 76 | Temp 98.2°F | Ht 72.0 in | Wt 250.0 lb

## 2015-07-21 DIAGNOSIS — E538 Deficiency of other specified B group vitamins: Secondary | ICD-10-CM

## 2015-07-21 DIAGNOSIS — E1140 Type 2 diabetes mellitus with diabetic neuropathy, unspecified: Secondary | ICD-10-CM

## 2015-07-21 DIAGNOSIS — G4700 Insomnia, unspecified: Secondary | ICD-10-CM

## 2015-07-21 DIAGNOSIS — R269 Unspecified abnormalities of gait and mobility: Secondary | ICD-10-CM

## 2015-07-21 DIAGNOSIS — Z23 Encounter for immunization: Secondary | ICD-10-CM

## 2015-07-21 DIAGNOSIS — I200 Unstable angina: Secondary | ICD-10-CM | POA: Diagnosis not present

## 2015-07-21 DIAGNOSIS — I10 Essential (primary) hypertension: Secondary | ICD-10-CM | POA: Diagnosis not present

## 2015-07-21 DIAGNOSIS — E1149 Type 2 diabetes mellitus with other diabetic neurological complication: Secondary | ICD-10-CM

## 2015-07-21 MED ORDER — ZALEPLON 5 MG PO CAPS: 5.0000 mg | ORAL_CAPSULE | Freq: Every evening | ORAL | Status: DC | PRN

## 2015-07-21 MED ORDER — CYANOCOBALAMIN 1000 MCG/ML IJ SOLN
1000.0000 ug | Freq: Once | INTRAMUSCULAR | Status: AC
  Administered 2015-07-21: 1000 ug via INTRAMUSCULAR

## 2015-07-21 NOTE — Progress Notes (Signed)
Subjective:    Patient ID: Steve Phillips, male    DOB: [birth year], 69 y.o.   MRN: [id number]  HPI  69YO male presents for follow up.  Recently released from rehab. Completed physical therapy. Left leg stronger. Using Kyeisha Janowicz at home. Using wheelchair sometimes. No falls at home.  BG have been in low 100s. Taking Lantus but not Novolog. Taking Metformin.  Planning to start cardiac rehab. Would like to start aquatic therapy.  Having trouble sleeping. No improvement with Melatonin. Cannot fall asleep. Worrying about things at night.  Wt Readings from Last 3 Encounters:  07/21/15 250 lb (113.399 kg)  06/09/15 257 lb (116.574 kg)  05/12/15 260 lb (117.935 kg)     Past Medical History  Diagnosis Date  . Shingles   . Hyperlipidemia   . Lower leg pain     chronic ,left leg  . Acute angina   . Depression   . CAD (coronary artery disease)     s/p multiple PCI with stent RCA,LAD and obtuse marginal,followed @ [facility] on the accord study.., cath 02/2012 90% D1, s/p drug-eluting stent Dr. Fletcher [name]  . Shortness of breath   . Hypertension     dr Jerilynn Mages Aud[facility] Acton     [facility]auer in [place]  . Bell's palsy   . Left leg pain   . 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 Type II diabetes mellitus   . GERD (gastroesophageal reflux disease)   . Headache(784.0)     "sometimes daily; sometimes weekly" (05/16/2015)  . Stroke 2015    denies residual on 05/16/2015  . Chronic lower back pain     "for 3 months now" (05/16/2015)  . Diabetic peripheral neuropathy     "feet"  . Falls frequently     "in the last month or so" (05/16/2015)  . Vascular parkinsonism dx'd 10/2014   Family History  Problem Relation Age of Onset  . Cancer Mother     BREAST  . Heart disease Mother     STENT  . Cancer Father     THROAT  . Heart disease Sister     STENT; HTN  . Heart attack Brother   . Hypertension Brother   . Hypertension Sister    Past Surgical History  Procedure Laterality Date  . Appendectomy      . Tibia fracture surgery Left ~ 2002  . Foot fracture surgery Left ~ 2002    "Jone's fracture"  . Testicle surgery Left 2000's    "put drainage tube in for infection"  . Back surgery    . Anterior cervical decomp/discectomy fusion N/A 04/30/2013    Procedure: ANTERIOR CERVICAL DECOMPRESSION/DISCECTOMY FUSION 2 LEVELS;  Surgeon: Faythe Ghee, MD;  Location: [facility];  Service: Neurosurgery;  Laterality: N/A;  Cervical four-five, Cervical six-seven anterior cervical decompression fusion with trabecular metal cage and plate  . Left heart catheterization with coronary angiogram N/A 02/23/2015    Procedure: LEFT HEART CATHETERIZATION WITH CORONARY ANGIOGRAM;  Surgeon: [name], MD;  Location: [facility] CATH [facility];  Service: Cardiovascular;  Laterality: N/A;  . Cholecystectomy open  1980's  . Cardiac catheterization  02/2012  . Coronary angioplasty with stent placement  ~ 2000-02/2015    "he has a total of 6 stents" (05/16/2015  . Fracture surgery    . Squamous cell carcinoma excision Left 2014    "arm"   Social History   Social History  . Marital Status: Married    Spouse  Name: N/A  . Number of Children: N/A  . Years of Education: N/A   Social History Main Topics  . Smoking status: Former Smoker -- 0.12 packs/day for 10 years    Types: Cigarettes    Quit date: 11/08/1971  . Smokeless tobacco: Never Used  . Alcohol Use: No  . Drug Use: No  . Sexual Activity: Not Currently   Other Topics Concern  . None   Social History Narrative   Used to work with [employer] until the 1990s and then became disabled and progressively worse   Currently lives with his wife for 68 years   States he wishes to be a full code    Review of Systems  Constitutional: Negative for fever, chills, activity change, appetite change, fatigue and unexpected weight change.  Eyes: Negative for visual disturbance.  Respiratory: Negative for cough and shortness of breath.   Cardiovascular: Negative for  chest pain, palpitations and leg swelling.  Gastrointestinal: Negative for nausea, vomiting, abdominal pain, diarrhea, constipation and abdominal distention.  Genitourinary: Negative for dysuria, urgency and difficulty urinating.  Musculoskeletal: Negative for myalgias, back pain, arthralgias and gait problem.  Skin: Negative for color change and rash.  Hematological: Negative for adenopathy.  Psychiatric/Behavioral: Positive for sleep disturbance. Negative for suicidal ideas and dysphoric mood. The patient is not nervous/anxious.        Objective:    BP 162/82 mmHg  Pulse 76  Temp(Src) 98.2 F (36.8 C)  Ht 6' (1.829 m)  Wt 250 lb (113.399 kg)  BMI 33.90 kg/m2  SpO2 99% Physical Exam  Constitutional: He is oriented to person, place, and time. He appears well-developed and well-nourished. No distress.  HENT:  Head: Normocephalic and atraumatic.  Right Ear: External ear normal.  Left Ear: External ear normal.  Nose: Nose normal.  Mouth/Throat: Oropharynx is clear and moist. No oropharyngeal exudate.  Eyes: Conjunctivae and EOM are normal. Pupils are equal, round, and reactive to light. Right eye exhibits no discharge. Left eye exhibits no discharge. No scleral icterus.  Neck: Normal range of motion. Neck supple. No tracheal deviation present. No thyromegaly present.  Cardiovascular: Normal rate, regular rhythm and normal heart sounds.  Exam reveals no gallop and no friction rub.   No murmur heard. Pulmonary/Chest: Effort normal and breath sounds normal. No accessory muscle usage. No tachypnea. No respiratory distress. He has no decreased breath sounds. He has no wheezes. He has no rhonchi. He has no rales. He exhibits no tenderness.  Musculoskeletal: Normal range of motion. He exhibits no edema.  Lymphadenopathy:    He has no cervical adenopathy.  Neurological: He is alert and oriented to person, place, and time. No cranial nerve deficit. Coordination normal.  Skin: Skin is warm  and dry. No rash noted. He is not diaphoretic. No erythema. No pallor.  Psychiatric: He has a normal mood and affect. His behavior is normal. Judgment and thought content normal.          Assessment & Plan:   Problem List Items Addressed This Visit      High   DM (diabetes mellitus), type 2 with neurological complications - Primary (Chronic)    BG improved. Continue Lantus. Repeat A1c today.      Relevant Orders   Comprehensive metabolic panel   Hemoglobin A1c   Gait abnormality    Strength improving. Continue PT. Will set up referral for aquatic therapy.      Relevant Orders   Ambulatory referral to Physical Therapy  CBC with Differential/Platelet   Hypertension (Chronic)    BP Readings from Last 3 Encounters:  07/21/15 162/82  07/05/15 89/57  06/09/15 118/64   BP higher today, however symptomatically doing well. Continue current meds. Renal function with labs.        Unprioritized   B12 deficiency   Relevant Medications   cyanocobalamin ((VITAMIN B-12)) injection 1,000 mcg (Completed) (Start on 07/21/2015  5:30 PM)   Insomnia    Recent insomnia. Will start Sonata 5mg  at bedtime. Follow up by email and visit in 4 weeks.      Relevant Medications   zaleplon (SONATA) 5 MG capsule    Other Visit Diagnoses    Encounter for immunization            Return in about 4 weeks (around 08/18/2015) for Recheck.

## 2015-07-21 NOTE — Assessment & Plan Note (Addendum)
BP Readings from Last 3 Encounters:  07/21/15 162/82  07/05/15 89/57  06/09/15 118/64   BP higher today, however symptomatically doing well. Continue current meds. Renal function with labs.

## 2015-07-21 NOTE — Assessment & Plan Note (Signed)
Strength improving. Continue PT. Will set up referral for aquatic therapy.

## 2015-07-21 NOTE — Telephone Encounter (Signed)
Called and spoke with pt's wife, Steve Phillips. She said that she is giving him 45 units of Lantus at bedtime. She restarted the metformin 2x qd. His blood sugar this AM, fasting was 107. It was 92 yesterday morning. She is keeping an eye on his blood sugar. He seems better at home. It is tough, but she is doing the best she can to take care of him. Be advised.

## 2015-07-21 NOTE — Assessment & Plan Note (Signed)
BG improved. Continue Lantus. Repeat A1c today.

## 2015-07-21 NOTE — Telephone Encounter (Signed)
1pm or 4:30pm for 54min

## 2015-07-21 NOTE — Telephone Encounter (Signed)
Please see below.

## 2015-07-21 NOTE — Assessment & Plan Note (Signed)
Recent insomnia. Will start Sonata 5mg  at bedtime. Follow up by email and visit in 4 weeks.

## 2015-07-21 NOTE — Patient Instructions (Signed)
Labs today.  Start Sonata 5mg  daily at bedtime to help with sleep.  Follow up in 4 weeks.

## 2015-07-21 NOTE — Progress Notes (Signed)
Pre visit review using our clinic review tool, if applicable. No additional management support is needed unless otherwise documented below in the visit note. 

## 2015-07-21 NOTE — Telephone Encounter (Signed)
Please reconsider taking insulin... It is dangerous to stop. Check sugars frequently and call us to let us know how they are.

## 2015-07-21 NOTE — Telephone Encounter (Signed)
Patient has requested to be on Dr. Thomes Dinning schedule today, for the afternoon. Please advise if this will be okay, and if what time slot. -Thanks

## 2015-07-21 NOTE — Telephone Encounter (Signed)
Wonderful sugars! Yes, check CBGs frequently and let us know how they evolve.

## 2015-07-22 DIAGNOSIS — M5442 Lumbago with sciatica, left side: Secondary | ICD-10-CM | POA: Insufficient documentation

## 2015-07-22 DIAGNOSIS — Z515 Encounter for palliative care: Secondary | ICD-10-CM | POA: Insufficient documentation

## 2015-07-22 LAB — CBC WITH DIFFERENTIAL/PLATELET
BASOS ABS: 0.1 10*3/uL (ref 0.0–0.1)
Basophils Relative: 0.6 % (ref 0.0–3.0)
EOS ABS: 0.1 10*3/uL (ref 0.0–0.7)
EOS PCT: 0.6 % (ref 0.0–5.0)
HCT: 41.4 % (ref 39.0–52.0)
Hemoglobin: 13.8 g/dL (ref 13.0–17.0)
Lymphocytes Relative: 56.6 % — ABNORMAL HIGH (ref 12.0–46.0)
Lymphs Abs: 7.8 10*3/uL — ABNORMAL HIGH (ref 0.7–4.0)
MCHC: 33.4 g/dL (ref 30.0–36.0)
MCV: 81.9 fl (ref 78.0–100.0)
MONO ABS: 0.9 10*3/uL (ref 0.1–1.0)
Monocytes Relative: 6.4 % (ref 3.0–12.0)
Neutro Abs: 4.9 10*3/uL (ref 1.4–7.7)
Neutrophils Relative %: 35.8 % — ABNORMAL LOW (ref 43.0–77.0)
PLATELETS: 263 10*3/uL (ref 150.0–400.0)
RBC: 5.05 Mil/uL (ref 4.22–5.81)
RDW: 16.1 % — AB (ref 11.5–15.5)
WBC: 13.8 10*3/uL — AB (ref 4.0–10.5)

## 2015-07-22 LAB — COMPREHENSIVE METABOLIC PANEL
ALK PHOS: 62 U/L (ref 39–117)
ALT: 16 U/L (ref 0–53)
AST: 38 U/L — ABNORMAL HIGH (ref 0–37)
Albumin: 3.3 g/dL — ABNORMAL LOW (ref 3.5–5.2)
BILIRUBIN TOTAL: 0.5 mg/dL (ref 0.2–1.2)
BUN: 11 mg/dL (ref 6–23)
CALCIUM: 8.6 mg/dL (ref 8.4–10.5)
CO2: 17 mEq/L — ABNORMAL LOW (ref 19–32)
Chloride: 105 mEq/L (ref 96–112)
Creatinine, Ser: 1.12 mg/dL (ref 0.40–1.50)
GFR: 68.68 mL/min (ref >60.00)
Glucose, Bld: 131 mg/dL — ABNORMAL HIGH (ref 70–99)
Potassium: 4 mEq/L (ref 3.5–5.1)
SODIUM: 140 meq/L (ref 135–145)
TOTAL PROTEIN: 6 g/dL (ref 6.0–8.3)

## 2015-07-22 LAB — HEMOGLOBIN A1C: HEMOGLOBIN A1C: 7.2 % — AB (ref 4.6–6.5)

## 2015-07-28 ENCOUNTER — Encounter: Payer: Medicare Other | Attending: Cardiovascular Disease | Admitting: *Deleted

## 2015-07-28 VITALS — Ht 72.0 in | Wt 247.1 lb

## 2015-07-28 DIAGNOSIS — I2510 Atherosclerotic heart disease of native coronary artery without angina pectoris: Secondary | ICD-10-CM | POA: Diagnosis not present

## 2015-07-28 DIAGNOSIS — Z955 Presence of coronary angioplasty implant and graft: Secondary | ICD-10-CM | POA: Diagnosis not present

## 2015-07-28 DIAGNOSIS — Z9861 Coronary angioplasty status: Secondary | ICD-10-CM | POA: Insufficient documentation

## 2015-07-28 NOTE — Progress Notes (Signed)
Cardiac Individual Treatment Plan  Patient Details  Name: Steve Phillips MRN: [id number] Date of Birth: 01/06/1946 Referring Provider:  [name], MD  Initial Encounter Date: Date: 07/28/15  Visit Diagnosis: CAD S/P percutaneous coronary angioplasty  Status post coronary artery stent placement  Patient's Home Medications on Admission:  Current outpatient prescriptions:  .  acetaminophen (TYLENOL) 325 MG tablet, Take 2 tablets (650 mg total) by mouth every 4 (four) hours as needed for headache or mild pain., Disp: , Rfl:  .  aspirin EC 81 MG tablet, Take 1 tablet (81 mg total) by mouth daily., Disp: , Rfl:  .  carbidopa-levodopa (SINEMET IR) 25-100 MG per tablet, Take 2 tablets by mouth 3 (three) times daily. , Disp: , Rfl:  .  carvedilol (COREG) 25 MG tablet, TAKE ONE TABLET BY MOUTH TWICE DAILY, Disp: 180 tablet, Rfl: 1 .  clopidogrel (PLAVIX) 75 MG tablet, Take 1 tablet (75 mg total) by mouth daily with breakfast., Disp: 90 tablet, Rfl: 1 .  cyanocobalamin (,VITAMIN B-12,) 1000 MCG/ML injection, Inject 1,000 mcg into the muscle every 30 (thirty) days. , Disp: , Rfl:  .  DULoxetine (CYMBALTA) 60 MG capsule, Take 1 capsule (60 mg total) by mouth daily. TAKE 1 CAPSULE DAILY, Disp: 30 capsule, Rfl: 1 .  gabapentin (NEURONTIN) 300 MG capsule, Take 300 mg by mouth 2 (two) times daily., Disp: , Rfl:  .  [START ON 08/04/2015] HYDROcodone-acetaminophen (NORCO/VICODIN) 5-325 MG per tablet, 1-2 po tid prn Earliest Fill Date: 08/04/15, Disp: , Rfl:  .  insulin glargine (LANTUS) 100 UNIT/ML injection, Inject 55 Units into the skin 2 (two) times daily., Disp: , Rfl:  .  lisinopril-hydrochlorothiazide (PRINZIDE,ZESTORETIC) 20-12.5 MG per tablet, Take 1 tablet by mouth 2 (two) times daily. (Patient taking differently: Take 1 tablet by mouth daily. ), Disp: 180 tablet, Rfl: 1 .  nitroGLYCERIN (NITROSTAT) 0.4 MG SL tablet, Place 1 tablet (0.4 mg total) under the tongue every 5 (five) minutes as needed  for chest pain., Disp: 30 tablet, Rfl: 6 .  pantoprazole (PROTONIX) 40 MG tablet, Take 1 tablet (40 mg total) by mouth 2 (two) times daily., Disp: 180 tablet, Rfl: 3 .  simvastatin (ZOCOR) 20 MG tablet, Take 1 tablet (20 mg total) by mouth at bedtime., Disp: 90 tablet, Rfl: 3 .  zaleplon (SONATA) 5 MG capsule, Take 1 capsule (5 mg total) by mouth at bedtime as needed for sleep., Disp: 30 capsule, Rfl: 3 .  oxyCODONE (OXY IR/ROXICODONE) 5 MG immediate release tablet, Take 1 tablet (5 mg total) by mouth every 4 (four) hours as needed for moderate pain. (Patient not taking: Reported on 07/28/2015), Disp: 30 tablet, Rfl: 0 .  senna (SENOKOT) 8.6 MG TABS tablet, Take 1 tablet (8.6 mg total) by mouth daily. (Patient not taking: Reported on 07/28/2015), Disp: 120 each, Rfl: 0  Past Medical History: Past Medical History  Diagnosis Date  . Shingles   . Hyperlipidemia   . Lower leg pain     chronic ,left leg  . Acute angina   . Depression   . CAD (coronary artery disease)     s/p multiple PCI with stent RCA,LAD and obtuse marginal,followed @ [facility] on the accord study.., cath 02/2012 90% D1, s/p drug-eluting stent Dr. Fletcher [name]  . Shortness of breath   . Hypertension     dr Jerilynn Mages Audelia Acton     [facility] in [place]  . Bell's palsy   . Left leg pain   . Squamous cell carcinoma of  arm 2014    left  . Type II diabetes mellitus   . GERD (gastroesophageal reflux disease)   . Headache(784.0)     "sometimes daily; sometimes weekly" (05/16/2015)  . Stroke 2015    denies residual on 05/16/2015  . Chronic lower back pain     "for 3 months now" (05/16/2015)  . Diabetic peripheral neuropathy     "feet"  . Falls frequently     "in the last month or so" (05/16/2015)  . Vascular parkinsonism dx'd 10/2014    Tobacco Use: History  Smoking status  . Former Smoker -- 0.12 packs/day for 10 years  . Types: Cigarettes  . Quit date: 11/08/1971  Smokeless tobacco  . Never Used    Labs: Recent Review Flowsheet Data     Labs for ITP Cardiac and Pulmonary Rehab Latest Ref Rng 11/16/2014 02/16/2015 03/05/2015 05/06/2015 07/21/2015   Cholestrol - 189 - 94 - -   LDLCALC - - - 50 - -   LDLDIRECT - 116.0 - - - -   HDL - 38.90(L) - 30(L) - -   Trlycerides - 306.0(H) - 70 - -   Hemoglobin A1c 4.6 - 6.5 % 9.7(H) 9.1(H) 8.7(H) 9.1(H) 7.2(H)       Exercise Target Goals: Date: 07/28/15  Exercise Program Goal: Individual exercise prescription set with THRR, safety & activity barriers. Participant demonstrates ability to understand and report RPE using BORG scale, to self-measure pulse accurately, and to acknowledge the importance of the exercise prescription.  Exercise Prescription Goal: Starting with aerobic activity 30 plus minutes a day, 3 days per week for initial exercise prescription. Provide home exercise prescription and guidelines that participant acknowledges understanding prior to discharge.  Activity Barriers & Risk Stratification:     Activity Barriers & Risk Stratification - 07/28/15 1826    Activity Barriers & Risk Stratification   Activity Barriers Back Problems;Balance Concerns;Assistive Device  Uses walker to ambulate   Risk Stratification High      6 Minute Walk:     6 Minute Walk      07/28/15 1519       6 Minute Walk   Phase Initial     Distance 355 feet     Walk Time 6 minutes     Resting HR 57 bpm     Resting BP 122/70 mmHg     Max Ex. HR 107 bpm     Max Ex. BP 146/70 mmHg     RPE 13     Symptoms No        Initial Exercise Prescription:     Initial Exercise Prescription - 07/28/15 1500    Date of Initial Exercise Prescription   Date 07/28/15   Treadmill   MPH 1   Grade 0   Minutes 10   Recumbant Bike   Level 2   RPM 40   Watts 20   Minutes 10   NuStep   Level 2   Watts 40   Minutes 10   Arm Ergometer   Level 1   Watts 10   Minutes 10   REL-XR   Level 2   Watts 40   Minutes 10   Prescription Details   Frequency (times per week) 3   Duration Progress  to 30 minutes of continuous aerobic without signs/symptoms of physical distress   Intensity   THRR REST +  30   Ratings of Perceived Exertion 11-15   Perceived Dyspnea 2-4   Progression Continue progressive overload  as per policy without signs/symptoms or physical distress.   Resistance Training   Training Prescription Yes   Weight 2   Reps 10-15      Exercise Prescription Changes:   Discharge Exercise Prescription (Final Exercise Prescription Changes):   Nutrition:  Target Goals: Understanding of nutrition guidelines, daily intake of sodium 1500mg , cholesterol 200mg , calories 30% from fat and 7% or less from saturated fats, daily to have 5 or more servings of fruits and vegetables.  Biometrics:     Pre Biometrics - 07/28/15 1523    Pre Biometrics   Height 6' (1.829 m)   Weight 247 lb 1.6 oz (112.084 kg)   Waist Circumference 46 inches   Hip Circumference 50 inches   Waist to Hip Ratio 0.92 %   BMI (Calculated) 33.6       Nutrition Therapy Plan and Nutrition Goals:   Nutrition Discharge: Rate Your Plate Scores:   Nutrition Goals Re-Evaluation:   Psychosocial: Target Goals: Acknowledge presence or absence of depression, maximize coping skills, provide positive support system. Participant is able to verbalize types and ability to use techniques and skills needed for reducing stress and depression.  Initial Review & Psychosocial Screening:     Initial Psych Review & Screening - 07/28/15 1832    Initial Review   Current issues with Current Depression   Family Dynamics   Good Support System? Yes   Barriers   Psychosocial barriers to participate in program There are no identifiable barriers or psychosocial needs.   Screening Interventions   Interventions Encouraged to exercise;Program counselor consult      Quality of Life Scores:   PHQ-9:     Recent Review Flowsheet Data    Depression screen [facility] 2/9 07/28/2015 03/11/2015 12/26/2012   Decreased Interest  1 0 0   Down, Depressed, Hopeless 1 0 0   PHQ - 2 Score 2 0 0   Altered sleeping 2 - -   Tired, decreased energy 3 - -   Change in appetite 1 - -   Feeling bad or failure about yourself  0 - -   Trouble concentrating 2 - -   Moving slowly or fidgety/restless 0 - -   Suicidal thoughts 0 - -   PHQ-9 Score 10 - -   Difficult doing work/chores Not difficult at all - -      Psychosocial Evaluation and Intervention:   Psychosocial Re-Evaluation:   Vocational Rehabilitation: Provide vocational rehab assistance to qualifying candidates.   Vocational Rehab Evaluation & Intervention:     Vocational Rehab - 07/28/15 1827    Initial Vocational Rehab Evaluation & Intervention   Assessment shows need for Vocational Rehabilitation No  Patient is retired      Education: Education Goals: Education classes will be provided on a weekly basis, covering required topics. Participant will state understanding/return demonstration of topics presented.  Learning Barriers/Preferences:     Learning Barriers/Preferences - 07/28/15 1827    Learning Barriers/Preferences   Learning Barriers Hearing   Learning Preferences Group Instruction      Education Topics: General Nutrition Guidelines/Fats and Fiber: -Group instruction provided by verbal, written material, models and posters to present the general guidelines for heart healthy nutrition. Gives an explanation and review of dietary fats and fiber.   Controlling Sodium/Reading Food Labels: -Group verbal and written material supporting the discussion of sodium use in heart healthy nutrition. Review and explanation with models, verbal and written materials for utilization of the food label.   Exercise Physiology &  Risk Factors: - Group verbal and written instruction with models to review the exercise physiology of the cardiovascular system and associated critical values. Details cardiovascular disease risk factors and the goals associated  with each risk factor.   Aerobic Exercise & Resistance Training: - Gives group verbal and written discussion on the health impact of inactivity. On the components of aerobic and resistive training programs and the benefits of this training and how to safely progress through these programs.   Flexibility, Balance, General Exercise Guidelines: - Provides group verbal and written instruction on the benefits of flexibility and balance training programs. Provides general exercise guidelines with specific guidelines to those with heart or lung disease. Demonstration and skill practice provided.   Stress Management: - Provides group verbal and written instruction about the health risks of elevated stress, cause of high stress, and healthy ways to reduce stress.   Depression: - Provides group verbal and written instruction on the correlation between heart/lung disease and depressed mood, treatment options, and the stigmas associated with seeking treatment.   Anatomy & Physiology of the Heart: - Group verbal and written instruction and models provide basic cardiac anatomy and physiology, with the coronary electrical and arterial systems. Review of: AMI, Angina, Valve disease, Heart Failure, Cardiac Arrhythmia, Pacemakers, and the ICD.   Cardiac Procedures: - Group verbal and written instruction and models to describe the testing methods done to diagnose heart disease. Reviews the outcomes of the test results. Describes the treatment choices: Medical Management, Angioplasty, or Coronary Bypass Surgery.   Cardiac Medications: - Group verbal and written instruction to review commonly prescribed medications for heart disease. Reviews the medication, class of the drug, and side effects. Includes the steps to properly store meds and maintain the prescription regimen.   Go Sex-Intimacy & Heart Disease, Get SMART - Goal Setting: - Group verbal and written instruction through game format to discuss  heart disease and the return to sexual intimacy. Provides group verbal and written material to discuss and apply goal setting through the application of the S.M.A.R.T. Method.   Other Matters of the Heart: - Provides group verbal, written materials and models to describe Heart Failure, Angina, Valve Disease, and Diabetes in the realm of heart disease. Includes description of the disease process and treatment options available to the cardiac patient.   Exercise & Equipment Safety: - Individual verbal instruction and demonstration of equipment use and safety with use of the equipment.          Cardiac Rehab from 07/28/2015 in [facility] Cardiac Rehab   Date  07/28/15   Educator  DW   Instruction Review Code  1- partially meets, needs review/practice      Infection Prevention: - Provides verbal and written material to individual with discussion of infection control including proper hand washing and proper equipment cleaning during exercise session.      Cardiac Rehab from 07/28/2015 in [facility] Cardiac Rehab   Date  07/28/15   Educator  DW   Instruction Review Code  2- meets goals/outcomes      Falls Prevention: - Provides verbal and written material to individual with discussion of falls prevention and safety.      Cardiac Rehab from 07/28/2015 in [facility] Cardiac Rehab   Date  07/28/15   Educator  DW   Instruction Review Code  1- partially meets, needs review/practice      Diabetes: - Individual verbal and written instruction to review signs/symptoms of diabetes, desired ranges of glucose level fasting, after meals  and with exercise. Advice that pre and post exercise glucose checks will be done for 3 sessions at entry of program.      Cardiac Rehab from 07/28/2015 in [facility] Cardiac Rehab   Date  07/28/15   Educator  DW   Instruction Review Code  2- meets goals/outcomes       Knowledge Questionnaire Score:   Personal Goals and Risk Factors at Admission:     Personal Goals and Risk Factors  at Admission - 07/28/15 1829    Personal Goals and Risk Factors on Admission    Weight Management Yes   Intervention Learn and follow the exercise and diet guidelines while in the program. Utilize the nutrition and education classes to help gain knowledge of the diet and exercise expectations in the program   Admit Weight 247 lb 1.6 oz (112.084 kg)   Goal Weight 195 lb (88.451 kg)   Increase Aerobic Exercise and Physical Activity Yes   Intervention While in program, learn and follow the exercise prescription taught. Start at a low level workload and increase workload after able to maintain previous level for 30 minutes. Increase time before increasing intensity.   Take Less Medication Yes   Intervention Learn your risk factors and begin the lifestyle modifications for risk factor control during your time in the program.   Understand more about Heart/Pulmonary Disease. Yes   Intervention While in program utilize professionals for any questions, and attend the education sessions. Great websites to use are www.americanheart.org or www.lung.org for reliable information.   Diabetes Yes   Goal Blood glucose control identified by blood glucose values, HgbA1C. Participant verbalizes understanding of the signs/symptoms of hyper/hypo glycemia, proper foot care and importance of medication and nutrition plan for blood glucose control.   Intervention Provide nutrition & aerobic exercise along with prescribed medications to achieve blood glucose in normal ranges: Fasting 65-99 mg/dL   Hypertension Yes   Goal Participant will see blood pressure controlled within the values of 140/69mm/Hg or within value directed by their physician.   Intervention Provide nutrition & aerobic exercise along with prescribed medications to achieve BP 140/90 or less.   Lipids Yes   Goal Cholesterol controlled with medications as prescribed, with individualized exercise RX and with personalized nutrition plan. Value goals: LDL <  70mg , HDL > 40mg . Participant states understanding of desired cholesterol values and following prescriptions.   Intervention Provide nutrition & aerobic exercise along with prescribed medications to achieve LDL 70mg , HDL >40mg .   Stress No   Personal Goal Other Yes   Personal Goal Improve balance   Intervention Other  Throughout program increase endurance, overall conditioning, strength and flexibility.        Personal Goals and Risk Factors Review:    Personal Goals Discharge:     Comments: Patient will be starting in the 3:45 p.m. Class on Monday, September 12th.  Patient ambulates with a walker and has difficulty hearing in right ear.

## 2015-07-28 NOTE — Patient Instructions (Signed)
Patient Instructions  Patient Details  Name: Steve Phillips MRN: [id number] Date of Birth: 1946/04/27 Referring Provider:  [name], MD  Below are the personal goals you chose as well as exercise and nutrition goals. Our goal is to help you keep on track towards obtaining and maintaining your goals. We will be discussing your progress on these goals with you throughout the program.  Initial Exercise Prescription:     Initial Exercise Prescription - 07/28/15 1500    Date of Initial Exercise Prescription   Date 07/28/15   Treadmill   MPH 1   Grade 0   Minutes 10   Recumbant Bike   Level 2   RPM 40   Watts 20   Minutes 10   NuStep   Level 2   Watts 40   Minutes 10   Arm Ergometer   Level 1   Watts 10   Minutes 10   REL-XR   Level 2   Watts 40   Minutes 10   Prescription Details   Frequency (times per week) 3   Duration Progress to 30 minutes of continuous aerobic without signs/symptoms of physical distress   Intensity   THRR REST +  30   Ratings of Perceived Exertion 11-15   Perceived Dyspnea 2-4   Progression Continue progressive overload as per policy without signs/symptoms or physical distress.   Resistance Training   Training Prescription Yes   Weight 2   Reps 10-15      Exercise Goals: Frequency: Be able to perform aerobic exercise three times per week working toward 3-5 days per week.  Intensity: Work with a perceived exertion of 11 (fairly light) - 15 (hard) as tolerated. Follow your new exercise prescription and watch for changes in prescription as you progress with the program. Changes will be reviewed with you when they are made.  Duration: You should be able to do 30 minutes of continuous aerobic exercise in addition to a 5 minute warm-up and a 5 minute cool-down routine.  Nutrition Goals: Your personal nutrition goals will be established when you do your nutrition analysis with the dietician.  The following are nutrition guidelines to  follow: Cholesterol < 200mg /day Sodium < 1500mg /day Fiber: Men over 50 yrs - 30 grams per day  Personal Goals:     Personal Goals and Risk Factors at Admission - 07/28/15 1829    Personal Goals and Risk Factors on Admission    Weight Management Yes   Intervention Learn and follow the exercise and diet guidelines while in the program. Utilize the nutrition and education classes to help gain knowledge of the diet and exercise expectations in the program   Admit Weight 247 lb 1.6 oz (112.084 kg)   Goal Weight 195 lb (88.451 kg)   Increase Aerobic Exercise and Physical Activity Yes   Intervention While in program, learn and follow the exercise prescription taught. Start at a low level workload and increase workload after able to maintain previous level for 30 minutes. Increase time before increasing intensity.   Take Less Medication Yes   Intervention Learn your risk factors and begin the lifestyle modifications for risk factor control during your time in the program.   Understand more about Heart/Pulmonary Disease. Yes   Intervention While in program utilize professionals for any questions, and attend the education sessions. Great websites to use are www.americanheart.org or www.lung.org for reliable information.   Diabetes Yes   Goal Blood glucose control identified by blood glucose values, HgbA1C.  Participant verbalizes understanding of the signs/symptoms of hyper/hypo glycemia, proper foot care and importance of medication and nutrition plan for blood glucose control.   Intervention Provide nutrition & aerobic exercise along with prescribed medications to achieve blood glucose in normal ranges: Fasting 65-99 mg/dL   Hypertension Yes   Goal Participant will see blood pressure controlled within the values of 140/50mm/Hg or within value directed by their physician.   Intervention Provide nutrition & aerobic exercise along with prescribed medications to achieve BP 140/90 or less.   Lipids Yes    Goal Cholesterol controlled with medications as prescribed, with individualized exercise RX and with personalized nutrition plan. Value goals: LDL < 70mg , HDL > 40mg . Participant states understanding of desired cholesterol values and following prescriptions.   Intervention Provide nutrition & aerobic exercise along with prescribed medications to achieve LDL 70mg , HDL >40mg .   Stress No   Personal Goal Other Yes   Personal Goal Improve balance   Intervention Other  Throughout program increase endurance, overall conditioning, strength and flexibility.        Tobacco Use Initial Evaluation: History  Smoking status  . Former Smoker -- 0.12 packs/day for 10 years  . Types: Cigarettes  . Quit date: 11/08/1971  Smokeless tobacco  . Never Used    Copy of goals given to participant.

## 2015-08-01 ENCOUNTER — Encounter: Payer: Self-pay | Admitting: *Deleted

## 2015-08-01 ENCOUNTER — Encounter: Payer: Medicare Other | Admitting: *Deleted

## 2015-08-01 DIAGNOSIS — Z9861 Coronary angioplasty status: Secondary | ICD-10-CM | POA: Diagnosis not present

## 2015-08-01 DIAGNOSIS — Z955 Presence of coronary angioplasty implant and graft: Secondary | ICD-10-CM | POA: Diagnosis not present

## 2015-08-01 DIAGNOSIS — I2510 Atherosclerotic heart disease of native coronary artery without angina pectoris: Secondary | ICD-10-CM

## 2015-08-01 LAB — GLUCOSE, CAPILLARY
Glucose-Capillary: 252 mg/dL — ABNORMAL HIGH (ref 65–99)
Glucose-Capillary: 237 mg/dL — ABNORMAL HIGH (ref 65–99)

## 2015-08-01 NOTE — Progress Notes (Signed)
Daily Session Note  Patient Details  Name: HORRIS SPEROS MRN: [id number] Date of Birth: 06/23/1946 Referring Provider:  [name], MD  Encounter Date: 08/01/2015  Check In:     Session Check In - 08/01/15 1608    Check-In   Staff Present Heath Lark RN, BSN, CCRP;Carroll Enterkin RN, BSN;Steven Way BS, ACSM EP-C, Exercise Physiologist   ER physicians immediately available to respond to emergencies See telemetry face sheet for immediately available ER MD   Medication changes reported     No   Fall or balance concerns reported    No   Warm-up and Cool-down Performed on first and last piece of equipment   VAD Patient? No   Pain Assessment   Currently in Pain? No/denies           Exercise Prescription Changes - 08/01/15 0600    Exercise Review   Progression No  Has not started program yet      Goals Met:  Exercise tolerated well Personal goals reviewed No report of cardiac concerns or symptoms Strength training completed today  Goals Unmet:  Not Applicable  Goals Comments: First exercise day, a bit short winded at end of aerobic, no complaints Pulse oximeter 98%.    Dr. Emily Filbert is Medical Director for [facility].

## 2015-08-02 ENCOUNTER — Encounter: Payer: Self-pay | Admitting: Internal Medicine

## 2015-08-02 ENCOUNTER — Ambulatory Visit (INDEPENDENT_AMBULATORY_CARE_PROVIDER_SITE_OTHER): Payer: Medicare Other | Admitting: Internal Medicine

## 2015-08-02 VITALS — BP 104/60 | HR 63 | Temp 97.4°F | Resp 12 | Wt 245.0 lb

## 2015-08-02 DIAGNOSIS — E1140 Type 2 diabetes mellitus with diabetic neuropathy, unspecified: Secondary | ICD-10-CM | POA: Diagnosis not present

## 2015-08-02 DIAGNOSIS — E1149 Type 2 diabetes mellitus with other diabetic neurological complication: Secondary | ICD-10-CM

## 2015-08-02 DIAGNOSIS — I200 Unstable angina: Secondary | ICD-10-CM

## 2015-08-02 MED ORDER — INSULIN REGULAR HUMAN 100 UNIT/ML IJ SOLN: 10.0000 [IU] | Freq: Three times a day (TID) | INTRAMUSCULAR | Status: DC

## 2015-08-02 NOTE — Patient Instructions (Addendum)
Please continue Metformin 1000 mg 2x a day.  Please decrease Lantus to 40 units 2x a day.  Please start Regular insulin: - 10 units before a regular meal - 14 units before a larger meal  Please return in 1.5 months with your sugar log.   Please check with your insurance whether they cover: - Victoza, Tanzeum, Bydureon, Trulicity - Invokana or Jardiance

## 2015-08-02 NOTE — Progress Notes (Signed)
Patient ID: Steve Phillips, male   DOB: [birth year], 69 y.o.   MRN: [id number]  HPI: Steve Phillips is a 69 y.o.-year-old male, returning for f/u for DM2, dx 1990's, insulin-dependent, uncontrolled, with complications (CAD, PN, CKD). Last visit 2 mo ago. He is here with his wife who offers part of the hx.  He was admitted for cord compression sxs in 04/2005 and had a long period of rehab. Sugar initially very poorly controlled >> we started U500 insulin >> but he had N/V >> we had to stop and switch to U500 insulin. Lately, sugars are great! Latest HbA1c was greatly decreased, to 7.2%!  Last hemoglobin A1c was: Lab Results  Component Value Date   HGBA1C 7.2* 07/21/2015   HGBA1C 9.1* 05/06/2015   HGBA1C 8.7* 03/05/2015  Was on Prednisone and Kenalog injections.  He was taking: - Toujeo 70 units 2x a day  - NovoLog: 50-55 units with a meal - NovoLog Sliding Scale:  - 150- 165: + 1 unit  - 166- 180: + 2 units  - 181- 195: + 3 units  - 196- 210: + 4 units  - 211- 225: + 5 units  - > 225: + 6 units   Was on U500 insulin: - 65 units before each meal.  Now on: - Lantus 45-55 units 2x a day - Metformin 1000 mg 2x day  He had nausea with U500 and Novolog.  Pt checks his sugars 2-3x a day and they are - brings log - much better: - am: 102-242 >> 69-145, 186 >> 112-313 >> 160, 237 >>  86-161 - 2h after b'fast: 216-289 >> n/c >> 260-318 >> 119, 183-250 >> 222-298, 416 >> n/c >> 182, 190 - before lunch: 211-227 >> forgot >> 237 >> 165-231 >> 204-244 >> n/c >> 165 >> 286 >> n/c - 2h after lunch: 251, 356 >> 49x1 (took insulin w/o eating), 73-248 >> n/c >> 158, 246 - before dinner: 121, 216-246 >> 58 -185 >> 154 >> 119, 238 >> 214 >> 292 >> 188-201 - 2h after dinner: 145-158, 299x1 >> 201-330 >> 109-273 >> 226-361, 499 >> n/c >> 223-250 - bedtime: 256, 307 >> 54x1, 81-119 >> 167-257 >> 104-298 >>> 145-278 >> n/c >> 222-337 (watermelon) + lows - lowest 53 >> 86; he has hypoglycemia awareness at  70.  Highest sugar was 297 >> 499 >> 600s with steroids >> 337  Pt's meals are - but he eats constantly! - Breakfast: 2 eggs, sausage, toast, coffee - Lunch: sandwich or leftovers - Dinner: meat + veggies + bread, tea or milk - Snacks: pm and late at night  - + mild CKD, last BUN/creatinine:  Lab Results  Component Value Date   BUN 11 07/21/2015   CREATININE 1.12 07/21/2015  On Lisinopril.  - last set of lipids: Lab Results  Component Value Date   CHOL 94 03/05/2015   HDL 30* 03/05/2015   LDLCALC 50 03/05/2015   LDLDIRECT 116.0 11/16/2014   TRIG 70 03/05/2015   CHOLHDL 5 11/16/2014  On Crestor. - last eye exam was 01/28/2014 ([name]). No DR.  - + no sensation in legs below knee. He sees his podiatrist q 3 mo ([name]). Foot exam performed 04/2015.  I reviewed pt's medications, allergies, PMH, social hx, family hx, and changes were documented in the history of present illness. Otherwise, unchanged from my initial visit note.  He had a stroke and Bells' palsy in 05/2014 >> in the hospital >>  started on steroids 50 mg x 4 days, then tapering >> sugars higher after this.   ROS: Constitutional: no weight gain/loss, no increased appetite, no fatigue, no subjective hypo/hyperthermia, + poor sleep Eyes:no blurry vision, no xerophthalmia ENT: no sore throat, no nodules palpated in throat, no dysphagia/odynophagia, no hoarseness, + hypoacusis Cardiovascular: no CP/no SOB/palpitations/leg swelling Respiratory: no cough/SOB Gastrointestinal: no N/V/D/C Musculoskeletal: + muscle aches/+ joint aches/+ back pain; + left leg pain Skin: no rashes Neurological: no tremors/+ numbness - up to knees/tingling/dizziness  PE: BP 104/60 mmHg  Pulse 63  Temp(Src) 97.4 F (36.3 C) (Oral)  Resp 12  Wt 245 lb (111.131 kg)  SpO2 95% Body mass index is 33.22 kg/(m^2). Wt Readings from Last 3 Encounters:  08/02/15 245 lb (111.131 kg)  07/28/15 247 lb 1.6 oz (112.084 kg)   07/21/15 250 lb (113.399 kg)   Constitutional: overweight, in NAD, walks with walker Eyes: PERRLA, EOMI, no exophthalmos ENT: moist mucous membranes, no thyromegaly, no cervical lymphadenopathy Cardiovascular: RRR, No MRG, + leg swelling bilaterally - pitting Respiratory: CTA B Gastrointestinal: abdomen soft, NT, ND, BS+ Musculoskeletal: no deformities Skin: moist, warm, no rashes  ASSESSMENT: 1. DM2, insulin-dependent, uncontrolled, with complications - CAD, s/p multiple PCI with stent RCA,LAD and obtuse marginal, followed @ [facility] on the accord study.., cath 02/2012 90% D1, s/p drug-eluting stent - Dr. Fletcher [name] - Peripheral neuropathy - CKD, mild  - tried U500 >> nausea He was also in the Accord Study for 8 years.   PLAN:  1. Patient with long-standing, uncontrolled, diabetes, now just on Lantus and Metformin. She has a staircase effect of sugars >> needs mealtime insulin >> will add regular insulin and decrease Lantus. He will find out from insurance if we can use other noon-insulin meds. - I suggested to:  Patient Instructions  Please continue Metformin 1000 mg 2x a day.  Please decrease Lantus to 40 units 2x a day.  Please start Regular insulin: - 10 units before a regular meal - 14 units before a larger meal  Please return in 1.5 months with your sugar log.   Please check with your insurance whether they cover: - Victoza, Tanzeum, Bydureon, Trulicity - Invokana or Jardiance    - continue checking sugars at different times of the day - check 3 times a day, rotating checks - Needs a new eye exam >> will schedule - RTC in 1.5 mo

## 2015-08-03 ENCOUNTER — Encounter: Payer: Medicare Other | Admitting: *Deleted

## 2015-08-03 ENCOUNTER — Telehealth: Payer: Self-pay | Admitting: Internal Medicine

## 2015-08-03 ENCOUNTER — Encounter: Payer: Self-pay | Admitting: *Deleted

## 2015-08-03 DIAGNOSIS — Z9861 Coronary angioplasty status: Secondary | ICD-10-CM | POA: Diagnosis not present

## 2015-08-03 DIAGNOSIS — Z955 Presence of coronary angioplasty implant and graft: Secondary | ICD-10-CM | POA: Diagnosis not present

## 2015-08-03 DIAGNOSIS — I2510 Atherosclerotic heart disease of native coronary artery without angina pectoris: Secondary | ICD-10-CM

## 2015-08-03 LAB — GLUCOSE, CAPILLARY: GLUCOSE-CAPILLARY: 163 mg/dL — AB (ref 65–99)

## 2015-08-03 NOTE — Telephone Encounter (Signed)
Called and spoke with pt's wife. [facility] to get the humulin r in and call them.

## 2015-08-03 NOTE — Telephone Encounter (Signed)
Pt states [facility] does not have the Humulin R. She is going to call again though because I informed her we received the confirmation from them yesterday at 10:55

## 2015-08-03 NOTE — Progress Notes (Signed)
Cardiac Individual Treatment Plan  Patient Details  Name: NGUYEN TODOROV MRN: [id number] Date of Birth: Dec 15, 1945 Referring Provider:  [name], MD  Initial Encounter Date:    Visit Diagnosis: CAD S/P percutaneous coronary angioplasty  Patient's Home Medications on Admission:  Current outpatient prescriptions:  .  acetaminophen (TYLENOL) 325 MG tablet, Take 2 tablets (650 mg total) by mouth every 4 (four) hours as needed for headache or mild pain., Disp: , Rfl:  .  aspirin EC 81 MG tablet, Take 1 tablet (81 mg total) by mouth daily., Disp: , Rfl:  .  carbidopa-levodopa (SINEMET IR) 25-100 MG per tablet, Take 2 tablets by mouth 3 (three) times daily. , Disp: , Rfl:  .  carvedilol (COREG) 25 MG tablet, TAKE ONE TABLET BY MOUTH TWICE DAILY, Disp: 180 tablet, Rfl: 1 .  clopidogrel (PLAVIX) 75 MG tablet, Take 1 tablet (75 mg total) by mouth daily with breakfast., Disp: 90 tablet, Rfl: 1 .  cyanocobalamin (,VITAMIN B-12,) 1000 MCG/ML injection, Inject 1,000 mcg into the muscle every 30 (thirty) days. , Disp: , Rfl:  .  DULoxetine (CYMBALTA) 60 MG capsule, Take 1 capsule (60 mg total) by mouth daily. TAKE 1 CAPSULE DAILY, Disp: 30 capsule, Rfl: 1 .  gabapentin (NEURONTIN) 300 MG capsule, Take 300 mg by mouth 2 (two) times daily., Disp: , Rfl:  .  [START ON 08/04/2015] HYDROcodone-acetaminophen (NORCO/VICODIN) 5-325 MG per tablet, 1-2 po tid prn Earliest Fill Date: 08/04/15, Disp: , Rfl:  .  insulin glargine (LANTUS) 100 UNIT/ML injection, Inject 40 Units into the skin 2 (two) times daily., Disp: , Rfl:  .  insulin regular (HUMULIN R) 100 units/mL injection, Inject 0.1-0.14 mLs (10-14 Units total) into the skin 3 (three) times daily before meals., Disp: 20 mL, Rfl: 1 .  lisinopril-hydrochlorothiazide (PRINZIDE,ZESTORETIC) 20-12.5 MG per tablet, Take 1 tablet by mouth 2 (two) times daily. (Patient taking differently: Take 1 tablet by mouth daily. ), Disp: 180 tablet, Rfl: 1 .  nitroGLYCERIN  (NITROSTAT) 0.4 MG SL tablet, Place 1 tablet (0.4 mg total) under the tongue every 5 (five) minutes as needed for chest pain., Disp: 30 tablet, Rfl: 6 .  oxyCODONE (OXY IR/ROXICODONE) 5 MG immediate release tablet, Take 1 tablet (5 mg total) by mouth every 4 (four) hours as needed for moderate pain., Disp: 30 tablet, Rfl: 0 .  pantoprazole (PROTONIX) 40 MG tablet, Take 1 tablet (40 mg total) by mouth 2 (two) times daily., Disp: 180 tablet, Rfl: 3 .  senna (SENOKOT) 8.6 MG TABS tablet, Take 1 tablet (8.6 mg total) by mouth daily., Disp: 120 each, Rfl: 0 .  simvastatin (ZOCOR) 20 MG tablet, Take 1 tablet (20 mg total) by mouth at bedtime., Disp: 90 tablet, Rfl: 3 .  zaleplon (SONATA) 5 MG capsule, Take 1 capsule (5 mg total) by mouth at bedtime as needed for sleep., Disp: 30 capsule, Rfl: 3  Past Medical History: Past Medical History  Diagnosis Date  . Shingles   . Hyperlipidemia   . Lower leg pain     chronic ,left leg  . Acute angina   . Depression   . CAD (coronary artery disease)     s/p multiple PCI with stent RCA,LAD and obtuse marginal,followed @ [facility] on the accord study.., cath 02/2012 90% D1, s/p drug-eluting stent Dr. Fletcher [name]  . Shortness of breath   . Hypertension     dr Jerilynn Mages Audelia Acton     [facility] in [place]  . Bell's palsy   .  Left leg pain   . 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 Type II diabetes mellitus   . GERD (gastroesophageal reflux disease)   . Headache(784.0)     "sometimes daily; sometimes weekly" (05/16/2015)  . Stroke 2015    denies residual on 05/16/2015  . Chronic lower back pain     "for 3 months now" (05/16/2015)  . Diabetic peripheral neuropathy     "feet"  . Falls frequently     "in the last month or so" (05/16/2015)  . Vascular parkinsonism dx'd 10/2014    Tobacco Use: History  Smoking status  . Former Smoker -- 0.12 packs/day for 10 years  . Types: Cigarettes  . Quit date: 11/08/1971  Smokeless tobacco  . Never Used    Labs: Recent  Review Flowsheet Data    Labs for ITP Cardiac and Pulmonary Rehab Latest Ref Rng 11/16/2014 02/16/2015 03/05/2015 05/06/2015 07/21/2015   Cholestrol - 189 - 94 - -   LDLCALC - - - 50 - -   LDLDIRECT - 116.0 - - - -   HDL - 38.90(L) - 30(L) - -   Trlycerides - 306.0(H) - 70 - -   Hemoglobin A1c 4.6 - 6.5 % 9.7(H) 9.1(H) 8.7(H) 9.1(H) 7.2(H)       Exercise Target Goals:    Exercise Program Goal: Individual exercise prescription set with THRR, safety & activity barriers. Participant demonstrates ability to understand and report RPE using BORG scale, to self-measure pulse accurately, and to acknowledge the importance of the exercise prescription.  Exercise Prescription Goal: Starting with aerobic activity 30 plus minutes a day, 3 days per week for initial exercise prescription. Provide home exercise prescription and guidelines that participant acknowledges understanding prior to discharge.  Activity Barriers & Risk Stratification:     Activity Barriers & Risk Stratification - 07/28/15 1826    Activity Barriers & Risk Stratification   Activity Barriers Back Problems;Balance Concerns;Assistive Device  Uses walker to ambulate   Risk Stratification High      6 Minute Walk:     6 Minute Walk      07/28/15 1519       6 Minute Walk   Phase Initial     Distance 355 feet     Walk Time 6 minutes     Resting HR 57 bpm     Resting BP 122/70 mmHg     Max Ex. HR 107 bpm     Max Ex. BP 146/70 mmHg     RPE 13     Symptoms No        Initial Exercise Prescription:     Initial Exercise Prescription - 07/28/15 1500    Date of Initial Exercise Prescription   Date 07/28/15   Treadmill   MPH 1   Grade 0   Minutes 10   Recumbant Bike   Level 2   RPM 40   Watts 20   Minutes 10   NuStep   Level 2   Watts 40   Minutes 10   Arm Ergometer   Level 1   Watts 10   Minutes 10   REL-XR   Level 2   Watts 40   Minutes 10   Prescription Details   Frequency (times per week) 3    Duration Progress to 30 minutes of continuous aerobic without signs/symptoms of physical distress   Intensity   THRR REST +  30   Ratings of Perceived Exertion 11-15  Perceived Dyspnea 2-4   Progression Continue progressive overload as per policy without signs/symptoms or physical distress.   Resistance Training   Training Prescription Yes   Weight 2   Reps 10-15      Exercise Prescription Changes:     Exercise Prescription Changes      08/01/15 0600           Exercise Review   Progression No  Has not started program yet          Discharge Exercise Prescription (Final Exercise Prescription Changes):     Exercise Prescription Changes - 08/01/15 0600    Exercise Review   Progression No  Has not started program yet      Nutrition:  Target Goals: Understanding of nutrition guidelines, daily intake of sodium 1500mg , cholesterol 200mg , calories 30% from fat and 7% or less from saturated fats, daily to have 5 or more servings of fruits and vegetables.  Biometrics:     Pre Biometrics - 07/28/15 1523    Pre Biometrics   Height 6' (1.829 m)   Weight 247 lb 1.6 oz (112.084 kg)   Waist Circumference 46 inches   Hip Circumference 50 inches   Waist to Hip Ratio 0.92 %   BMI (Calculated) 33.6       Nutrition Therapy Plan and Nutrition Goals:   Nutrition Discharge: Rate Your Plate Scores:   Nutrition Goals Re-Evaluation:   Psychosocial: Target Goals: Acknowledge presence or absence of depression, maximize coping skills, provide positive support system. Participant is able to verbalize types and ability to use techniques and skills needed for reducing stress and depression.  Initial Review & Psychosocial Screening:     Initial Psych Review & Screening - 07/28/15 1912    Initial Review   Current issues with Current Sleep Concerns;Current Depression;Current Psychotropic Meds      Quality of Life Scores:   PHQ-9:     Recent Review Flowsheet Data     Depression screen [facility] 2/9 07/28/2015 03/11/2015 12/26/2012   Decreased Interest 1 0 0   Down, Depressed, Hopeless 1 0 0   PHQ - 2 Score 2 0 0   Altered sleeping 2 - -   Tired, decreased energy 3 - -   Change in appetite 1 - -   Feeling bad or failure about yourself  0 - -   Trouble concentrating 2 - -   Moving slowly or fidgety/restless 0 - -   Suicidal thoughts 0 - -   PHQ-9 Score 10 - -   Difficult doing work/chores Not difficult at all - -      Psychosocial Evaluation and Intervention:   Psychosocial Re-Evaluation:   Vocational Rehabilitation: Provide vocational rehab assistance to qualifying candidates.   Vocational Rehab Evaluation & Intervention:     Vocational Rehab - 07/28/15 1827    Initial Vocational Rehab Evaluation & Intervention   Assessment shows need for Vocational Rehabilitation No  Patient is retired      Education: Education Goals: Education classes will be provided on a weekly basis, covering required topics. Participant will state understanding/return demonstration of topics presented.  Learning Barriers/Preferences:     Learning Barriers/Preferences - 07/28/15 1827    Learning Barriers/Preferences   Learning Barriers Hearing   Learning Preferences Group Instruction      Education Topics: General Nutrition Guidelines/Fats and Fiber: -Group instruction provided by verbal, written material, models and posters to present the general guidelines for heart healthy nutrition. Gives an explanation and review of dietary  fats and fiber.   Controlling Sodium/Reading Food Labels: -Group verbal and written material supporting the discussion of sodium use in heart healthy nutrition. Review and explanation with models, verbal and written materials for utilization of the food label.   Exercise Physiology & Risk Factors: - Group verbal and written instruction with models to review the exercise physiology of the cardiovascular system and associated critical  values. Details cardiovascular disease risk factors and the goals associated with each risk factor.   Aerobic Exercise & Resistance Training: - Gives group verbal and written discussion on the health impact of inactivity. On the components of aerobic and resistive training programs and the benefits of this training and how to safely progress through these programs.          Cardiac Rehab from 08/01/2015 in [facility] Cardiac Rehab   Date  08/01/15   Educator  SW   Instruction Review Code  2- meets goals/outcomes      Flexibility, Balance, General Exercise Guidelines: - Provides group verbal and written instruction on the benefits of flexibility and balance training programs. Provides general exercise guidelines with specific guidelines to those with heart or lung disease. Demonstration and skill practice provided.   Stress Management: - Provides group verbal and written instruction about the health risks of elevated stress, cause of high stress, and healthy ways to reduce stress.   Depression: - Provides group verbal and written instruction on the correlation between heart/lung disease and depressed mood, treatment options, and the stigmas associated with seeking treatment.   Anatomy & Physiology of the Heart: - Group verbal and written instruction and models provide basic cardiac anatomy and physiology, with the coronary electrical and arterial systems. Review of: AMI, Angina, Valve disease, Heart Failure, Cardiac Arrhythmia, Pacemakers, and the ICD.   Cardiac Procedures: - Group verbal and written instruction and models to describe the testing methods done to diagnose heart disease. Reviews the outcomes of the test results. Describes the treatment choices: Medical Management, Angioplasty, or Coronary Bypass Surgery.   Cardiac Medications: - Group verbal and written instruction to review commonly prescribed medications for heart disease. Reviews the medication, class of the drug, and  side effects. Includes the steps to properly store meds and maintain the prescription regimen.   Go Sex-Intimacy & Heart Disease, Get SMART - Goal Setting: - Group verbal and written instruction through game format to discuss heart disease and the return to sexual intimacy. Provides group verbal and written material to discuss and apply goal setting through the application of the S.M.A.R.T. Method.   Other Matters of the Heart: - Provides group verbal, written materials and models to describe Heart Failure, Angina, Valve Disease, and Diabetes in the realm of heart disease. Includes description of the disease process and treatment options available to the cardiac patient.   Exercise & Equipment Safety: - Individual verbal instruction and demonstration of equipment use and safety with use of the equipment.      Cardiac Rehab from 08/01/2015 in [facility] Cardiac Rehab   Date  07/28/15   Educator  DW   Instruction Review Code  1- partially meets, needs review/practice      Infection Prevention: - Provides verbal and written material to individual with discussion of infection control including proper hand washing and proper equipment cleaning during exercise session.      Cardiac Rehab from 08/01/2015 in [facility] Cardiac Rehab   Date  07/28/15   Educator  DW   Instruction Review Code  2- meets goals/outcomes  Falls Prevention: - Provides verbal and written material to individual with discussion of falls prevention and safety.      Cardiac Rehab from 08/01/2015 in [facility] Cardiac Rehab   Date  07/28/15   Educator  DW   Instruction Review Code  1- partially meets, needs review/practice      Diabetes: - Individual verbal and written instruction to review signs/symptoms of diabetes, desired ranges of glucose level fasting, after meals and with exercise. Advice that pre and post exercise glucose checks will be done for 3 sessions at entry of program.      Cardiac Rehab from 08/01/2015 in [facility]  Cardiac Rehab   Date  07/28/15   Educator  DW   Instruction Review Code  2- meets goals/outcomes       Knowledge Questionnaire Score:   Personal Goals and Risk Factors at Admission:     Personal Goals and Risk Factors at Admission - 07/28/15 1829    Personal Goals and Risk Factors on Admission    Weight Management Yes   Intervention Learn and follow the exercise and diet guidelines while in the program. Utilize the nutrition and education classes to help gain knowledge of the diet and exercise expectations in the program   Admit Weight 247 lb 1.6 oz (112.084 kg)   Goal Weight 195 lb (88.451 kg)   Increase Aerobic Exercise and Physical Activity Yes   Intervention While in program, learn and follow the exercise prescription taught. Start at a low level workload and increase workload after able to maintain previous level for 30 minutes. Increase time before increasing intensity.   Take Less Medication Yes   Intervention Learn your risk factors and begin the lifestyle modifications for risk factor control during your time in the program.   Understand more about Heart/Pulmonary Disease. Yes   Intervention While in program utilize professionals for any questions, and attend the education sessions. Great websites to use are www.americanheart.org or www.lung.org for reliable information.   Diabetes Yes   Goal Blood glucose control identified by blood glucose values, HgbA1C. Participant verbalizes understanding of the signs/symptoms of hyper/hypo glycemia, proper foot care and importance of medication and nutrition plan for blood glucose control.   Intervention Provide nutrition & aerobic exercise along with prescribed medications to achieve blood glucose in normal ranges: Fasting 65-99 mg/dL   Hypertension Yes   Goal Participant will see blood pressure controlled within the values of 140/90mm/Hg or within value directed by their physician.   Intervention Provide nutrition & aerobic exercise  along with prescribed medications to achieve BP 140/90 or less.   Lipids Yes   Goal Cholesterol controlled with medications as prescribed, with individualized exercise RX and with personalized nutrition plan. Value goals: LDL < 70mg , HDL > 40mg . Participant states understanding of desired cholesterol values and following prescriptions.   Intervention Provide nutrition & aerobic exercise along with prescribed medications to achieve LDL 70mg , HDL >40mg .   Stress No   Personal Goal Other Yes   Personal Goal Improve balance   Intervention Other  Throughout program increase endurance, overall conditioning, strength and flexibility.        Personal Goals and Risk Factors Review:    Personal Goals Discharge:     Comments: 30 day review Continue with current ITP  Has attended 2 sessions.

## 2015-08-03 NOTE — Progress Notes (Signed)
Daily Session Note  Patient Details  Name: Steve Phillips MRN: [id number] Date of Birth: 1946/06/12 Referring Provider:  [name], MD  Encounter Date: 08/03/2015  Check In:     Session Check In - 08/03/15 1706    Check-In   Staff Present Nyoka Cowden RN;Carroll Enterkin RN, Drusilla Kanner MS, ACSM CEP Exercise Physiologist   ER physicians immediately available to respond to emergencies See telemetry face sheet for immediately available ER MD   Medication changes reported     No   Fall or balance concerns reported    No   Warm-up and Cool-down Performed on first and last piece of equipment   VAD Patient? No   Pain Assessment   Currently in Pain? No/denies   Multiple Pain Sites No         Goals Met:  Independence with exercise equipment Exercise tolerated well Personal goals reviewed No report of cardiac concerns or symptoms Strength training completed today  Goals Unmet:  Not Applicable  Goals Comments: [name] did well with exercise today. He is very weak in the lower body and has difficulty getting up out of chairs and seated positions. We will be addressing this during our strength training portion of class.    Dr. Emily Filbert is Medical Director for [facility].

## 2015-08-04 DIAGNOSIS — Z955 Presence of coronary angioplasty implant and graft: Secondary | ICD-10-CM | POA: Diagnosis not present

## 2015-08-04 DIAGNOSIS — I2510 Atherosclerotic heart disease of native coronary artery without angina pectoris: Secondary | ICD-10-CM | POA: Diagnosis not present

## 2015-08-04 DIAGNOSIS — Z9861 Coronary angioplasty status: Secondary | ICD-10-CM | POA: Diagnosis not present

## 2015-08-04 LAB — GLUCOSE, CAPILLARY
GLUCOSE-CAPILLARY: 252 mg/dL — AB (ref 65–99)
Glucose-Capillary: 166 mg/dL — ABNORMAL HIGH (ref 65–99)

## 2015-08-04 NOTE — Progress Notes (Signed)
Cardiac Individual Treatment Plan  Patient Details  Name: Steve Phillips MRN: [id number] Date of Birth: 1946-02-21 Referring Provider:  [name], MD  Initial Encounter Date: Date: 07/28/15  Visit Diagnosis: CAD S/P percutaneous coronary angioplasty - Plan: CARDIAC REHAB 9 DAY REVIEW  Status post coronary artery stent placement - Plan: CARDIAC REHAB 30 DAY REVIEW  Patient's Home Medications on Admission:  Current outpatient prescriptions:  .  acetaminophen (TYLENOL) 325 MG tablet, Take 2 tablets (650 mg total) by mouth every 4 (four) hours as needed for headache or mild pain., Disp: , Rfl:  .  aspirin EC 81 MG tablet, Take 1 tablet (81 mg total) by mouth daily., Disp: , Rfl:  .  carbidopa-levodopa (SINEMET IR) 25-100 MG per tablet, Take 2 tablets by mouth 3 (three) times daily. , Disp: , Rfl:  .  carvedilol (COREG) 25 MG tablet, TAKE ONE TABLET BY MOUTH TWICE DAILY, Disp: 180 tablet, Rfl: 1 .  clopidogrel (PLAVIX) 75 MG tablet, Take 1 tablet (75 mg total) by mouth daily with breakfast., Disp: 90 tablet, Rfl: 1 .  cyanocobalamin (,VITAMIN B-12,) 1000 MCG/ML injection, Inject 1,000 mcg into the muscle every 30 (thirty) days. , Disp: , Rfl:  .  DULoxetine (CYMBALTA) 60 MG capsule, Take 1 capsule (60 mg total) by mouth daily. TAKE 1 CAPSULE DAILY, Disp: 30 capsule, Rfl: 1 .  gabapentin (NEURONTIN) 300 MG capsule, Take 300 mg by mouth 2 (two) times daily., Disp: , Rfl:  .  HYDROcodone-acetaminophen (NORCO/VICODIN) 5-325 MG per tablet, 1-2 po tid prn Earliest Fill Date: 08/04/15, Disp: , Rfl:  .  insulin glargine (LANTUS) 100 UNIT/ML injection, Inject 40 Units into the skin 2 (two) times daily., Disp: , Rfl:  .  lisinopril-hydrochlorothiazide (PRINZIDE,ZESTORETIC) 20-12.5 MG per tablet, Take 1 tablet by mouth 2 (two) times daily. (Patient taking differently: Take 1 tablet by mouth daily. ), Disp: 180 tablet, Rfl: 1 .  nitroGLYCERIN (NITROSTAT) 0.4 MG SL tablet, Place 1 tablet (0.4 mg total)  under the tongue every 5 (five) minutes as needed for chest pain., Disp: 30 tablet, Rfl: 6 .  pantoprazole (PROTONIX) 40 MG tablet, Take 1 tablet (40 mg total) by mouth 2 (two) times daily., Disp: 180 tablet, Rfl: 3 .  simvastatin (ZOCOR) 20 MG tablet, Take 1 tablet (20 mg total) by mouth at bedtime., Disp: 90 tablet, Rfl: 3 .  zaleplon (SONATA) 5 MG capsule, Take 1 capsule (5 mg total) by mouth at bedtime as needed for sleep., Disp: 30 capsule, Rfl: 3 .  insulin regular (HUMULIN R) 100 units/mL injection, Inject 0.1-0.14 mLs (10-14 Units total) into the skin 3 (three) times daily before meals., Disp: 20 mL, Rfl: 1 .  oxyCODONE (OXY IR/ROXICODONE) 5 MG immediate release tablet, Take 1 tablet (5 mg total) by mouth every 4 (four) hours as needed for moderate pain., Disp: 30 tablet, Rfl: 0 .  senna (SENOKOT) 8.6 MG TABS tablet, Take 1 tablet (8.6 mg total) by mouth daily., Disp: 120 each, Rfl: 0  Past Medical History: Past Medical History  Diagnosis Date  . Shingles   . Hyperlipidemia   . Lower leg pain     chronic ,left leg  . Acute angina   . Depression   . CAD (coronary artery disease)     s/p multiple PCI with stent RCA,LAD and obtuse marginal,followed @ [facility] on the accord study.., cath 02/2012 90% D1, s/p drug-eluting stent Dr. Fletcher [name]  . Shortness of breath   . Hypertension  dr Jerilynn Mages Audelia Acton     labauer in [place]  . Bell's palsy   . Left leg pain   . 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 Type II diabetes mellitus   . GERD (gastroesophageal reflux disease)   . Headache(784.0)     "sometimes daily; sometimes weekly" (05/16/2015)  . Stroke 2015    denies residual on 05/16/2015  . Chronic lower back pain     "for 3 months now" (05/16/2015)  . Diabetic peripheral neuropathy     "feet"  . Falls frequently     "in the last month or so" (05/16/2015)  . Vascular parkinsonism dx'd 10/2014    Tobacco Use: History  Smoking status  . Former Smoker -- 0.12 packs/day for 10 years   . Types: Cigarettes  . Quit date: 11/08/1971  Smokeless tobacco  . Never Used    Labs: Recent Review Flowsheet Data    Labs for ITP Cardiac and Pulmonary Rehab Latest Ref Rng 11/16/2014 02/16/2015 03/05/2015 05/06/2015 07/21/2015   Cholestrol - 189 - 94 - -   LDLCALC - - - 50 - -   LDLDIRECT - 116.0 - - - -   HDL - 38.90(L) - 30(L) - -   Trlycerides - 306.0(H) - 70 - -   Hemoglobin A1c 4.6 - 6.5 % 9.7(H) 9.1(H) 8.7(H) 9.1(H) 7.2(H)       Exercise Target Goals: Date: 07/28/15  Exercise Program Goal: Individual exercise prescription set with THRR, safety & activity barriers. Participant demonstrates ability to understand and report RPE using BORG scale, to self-measure pulse accurately, and to acknowledge the importance of the exercise prescription.  Exercise Prescription Goal: Starting with aerobic activity 30 plus minutes a day, 3 days per week for initial exercise prescription. Provide home exercise prescription and guidelines that participant acknowledges understanding prior to discharge.  Activity Barriers & Risk Stratification:     Activity Barriers & Risk Stratification - 07/28/15 1826    Activity Barriers & Risk Stratification   Activity Barriers Back Problems;Balance Concerns;Assistive Device  Uses walker to ambulate   Risk Stratification High      6 Minute Walk:     6 Minute Walk      07/28/15 1519       6 Minute Walk   Phase Initial     Distance 355 feet     Walk Time 6 minutes     Resting HR 57 bpm     Resting BP 122/70 mmHg     Max Ex. HR 107 bpm     Max Ex. BP 146/70 mmHg     RPE 13     Symptoms No        Initial Exercise Prescription:     Initial Exercise Prescription - 07/28/15 1500    Date of Initial Exercise Prescription   Date 07/28/15   Treadmill   MPH 1   Grade 0   Minutes 10   Recumbant Bike   Level 2   RPM 40   Watts 20   Minutes 10   NuStep   Level 2   Watts 40   Minutes 10   Arm Ergometer   Level 1   Watts 10    Minutes 10   REL-XR   Level 2   Watts 40   Minutes 10   Prescription Details   Frequency (times per week) 3   Duration Progress to 30 minutes of continuous aerobic without signs/symptoms of physical distress  Intensity   THRR REST +  30   Ratings of Perceived Exertion 11-15   Perceived Dyspnea 2-4   Progression Continue progressive overload as per policy without signs/symptoms or physical distress.   Resistance Training   Training Prescription Yes   Weight 2   Reps 10-15      Exercise Prescription Changes:     Exercise Prescription Changes      08/01/15 0600           Exercise Review   Progression No  Has not started program yet          Discharge Exercise Prescription (Final Exercise Prescription Changes):     Exercise Prescription Changes - 08/01/15 0600    Exercise Review   Progression No  Has not started program yet      Nutrition:  Target Goals: Understanding of nutrition guidelines, daily intake of sodium <1575m, cholesterol <2023m calories 30% from fat and 7% or less from saturated fats, daily to have 5 or more servings of fruits and vegetables.  Biometrics:     Pre Biometrics - 07/28/15 1523    Pre Biometrics   Height 6' (1.829 m)   Weight 247 lb 1.6 oz (112.084 kg)   Waist Circumference 46 inches   Hip Circumference 50 inches   Waist to Hip Ratio 0.92 %   BMI (Calculated) 33.6       Nutrition Therapy Plan and Nutrition Goals:   Nutrition Discharge: Rate Your Plate Scores:   Nutrition Goals Re-Evaluation:   Psychosocial: Target Goals: Acknowledge presence or absence of depression, maximize coping skills, provide positive support system. Participant is able to verbalize types and ability to use techniques and skills needed for reducing stress and depression.  Initial Review & Psychosocial Screening:     Initial Psych Review & Screening - 07/28/15 1912    Initial Review   Current issues with Current Sleep Concerns;Current  Depression;Current Psychotropic Meds      Quality of Life Scores:     Quality of Life - 08/04/15 1734    Quality of Life Scores   Health/Function Pre 3.6 %   Socioeconomic Pre 20 %   Psych/Spiritual Pre 18 %   Family Pre 22.8 %   GLOBAL Pre 12.55 %      PHQ-9:     Recent Review Flowsheet Data    [facility] 07/28[facility]2015 03/11/2015 12/26/2012   Decreased Interest 1 0 0   Down, Depressed, Hopeless 1 0 0   PHQ - 2 Score 2 0 0   Altered sleeping 2 - -   Tired, decreased energy 3 - -   Change in appetite 1 - -   Feeling bad or failure about yourself  0 - -   Trouble concentrating 2 - -   Moving slowly or fidgety/restless 0 - -   Suicidal thoughts 0 - -   PHQ-9 Score 10 - -   Difficult doing work/chores Not difficult at all - -      Psychosocial Evaluation and Intervention:     Psychosocial Evaluation - 08/03/15 1720    Psychosocial Evaluation & Interventions   Interventions Stress management education;Relaxation education;Encouraged to exercise with the program and follow exercise prescription   Comments Counselor met with Mr. DaShipesoday for initial psychosocial evaluation.  He is a [age]ear old who had 6 stents inserted over the summer and has spent several months in rehabiliation for nerve damage in his leg as a result.  He has a  strong support system with a spouse of 80 years and several adult children living close by as well as active involvement in his local church community.  He reports difficulty sleeping since he was taken off a sleep aid by his Dr. and his appetite has decreased since the surgery.  He reports a history of depression and anxiety and has been addressing this through medication and talk therapy.  He states his current mood is positive and he is optimistic about this program.  His goals are to improve his balance, increase his energy and stamina and lose some more weight.  Counselor encouraged him to check with his Dr. or pharmacist re: an OTC sleep  aid since the Melatonin he is using is not working.  Counselor also taught deep breathing and muscle relaxation techniques and will follow with him as needed.   Continued Psychosocial Services Needed --  Mr. Perreira will benefit from the psychoeducational components of this program considering his history of depression and anxiety.  He also will benefit from consistent exercise and learning relaxation techniques.       Psychosocial Re-Evaluation:   Vocational Rehabilitation: Provide vocational rehab assistance to qualifying candidates.   Vocational Rehab Evaluation & Intervention:     Vocational Rehab - 07/28/15 1827    Initial Vocational Rehab Evaluation & Intervention   Assessment shows need for Vocational Rehabilitation No  Patient is retired      Education: Education Goals: Education classes will be provided on a weekly basis, covering required topics. Participant will state understanding/return demonstration of topics presented.  Learning Barriers/Preferences:     Learning Barriers/Preferences - 07/28/15 1827    Learning Barriers/Preferences   Learning Barriers Hearing   Learning Preferences Group Instruction      Education Topics: General Nutrition Guidelines/Fats and Fiber: -Group instruction provided by verbal, written material, models and posters to present the general guidelines for heart healthy nutrition. Gives an explanation and review of dietary fats and fiber.   Controlling Sodium/Reading Food Labels: -Group verbal and written material supporting the discussion of sodium use in heart healthy nutrition. Review and explanation with models, verbal and written materials for utilization of the food label.   Exercise Physiology & Risk Factors: - Group verbal and written instruction with models to review the exercise physiology of the cardiovascular system and associated critical values. Details cardiovascular disease risk factors and the goals associated with each  risk factor.   Aerobic Exercise & Resistance Training: - Gives group verbal and written discussion on the health impact of inactivity. On the components of aerobic and resistive training programs and the benefits of this training and how to safely progress through these programs.   Flexibility, Balance, General Exercise Guidelines: - Provides group verbal and written instruction on the benefits of flexibility and balance training programs. Provides general exercise guidelines with specific guidelines to those with heart or lung disease. Demonstration and skill practice provided.   Stress Management: - Provides group verbal and written instruction about the health risks of elevated stress, cause of high stress, and healthy ways to reduce stress.   Depression: - Provides group verbal and written instruction on the correlation between heart/lung disease and depressed mood, treatment options, and the stigmas associated with seeking treatment.   Anatomy & Physiology of the Heart: - Group verbal and written instruction and models provide basic cardiac anatomy and physiology, with the coronary electrical and arterial systems. Review of: AMI, Angina, Valve disease, Heart Failure, Cardiac Arrhythmia, Pacemakers, and the ICD.  Cardiac Procedures: - Group verbal and written instruction and models to describe the testing methods done to diagnose heart disease. Reviews the outcomes of the test results. Describes the treatment choices: Medical Management, Angioplasty, or Coronary Bypass Surgery.   Cardiac Medications: - Group verbal and written instruction to review commonly prescribed medications for heart disease. Reviews the medication, class of the drug, and side effects. Includes the steps to properly store meds and maintain the prescription regimen.   Go Sex-Intimacy & Heart Disease, Get SMART - Goal Setting: - Group verbal and written instruction through game format to discuss heart disease  and the return to sexual intimacy. Provides group verbal and written material to discuss and apply goal setting through the application of the S.M.A.R.T. Method.   Other Matters of the Heart: - Provides group verbal, written materials and models to describe Heart Failure, Angina, Valve Disease, and Diabetes in the realm of heart disease. Includes description of the disease process and treatment options available to the cardiac patient.   Exercise & Equipment Safety: - Individual verbal instruction and demonstration of equipment use and safety with use of the equipment.          Cardiac Rehab from 07/28/2015 in [facility] Cardiac Rehab   Date  07/28/15   Educator  DW   Instruction Review Code  1- partially meets, needs review/practice      Infection Prevention: - Provides verbal and written material to individual with discussion of infection control including proper hand washing and proper equipment cleaning during exercise session.      Cardiac Rehab from 07/28/2015 in [facility] Cardiac Rehab   Date  07/28/15   Educator  DW   Instruction Review Code  2- meets goals/outcomes      Falls Prevention: - Provides verbal and written material to individual with discussion of falls prevention and safety.      Cardiac Rehab from 07/28/2015 in [facility] Cardiac Rehab   Date  07/28/15   Educator  DW   Instruction Review Code  1- partially meets, needs review/practice      Diabetes: - Individual verbal and written instruction to review signs/symptoms of diabetes, desired ranges of glucose level fasting, after meals and with exercise. Advice that pre and post exercise glucose checks will be done for 3 sessions at entry of program.      Cardiac Rehab from 07/28/2015 in [facility] Cardiac Rehab   Date  07/28/15   Educator  DW   Instruction Review Code  2- meets goals/outcomes       Knowledge Questionnaire Score:     Knowledge Questionnaire Score - 08/04/15 1733    Knowledge Questionnaire Score   Pre Score 24       Personal Goals and Risk Factors at Admission:     Personal Goals and Risk Factors at Admission - 07/28/15 1829    Personal Goals and Risk Factors on Admission    Weight Management Yes   Intervention Learn and follow the exercise and diet guidelines while in the program. Utilize the nutrition and education classes to help gain knowledge of the diet and exercise expectations in the program   Admit Weight 247 lb 1.6 oz (112.084 kg)   Goal Weight 195 lb (88.451 kg)   Increase Aerobic Exercise and Physical Activity Yes   Intervention While in program, learn and follow the exercise prescription taught. Start at a low level workload and increase workload after able to maintain previous level for 30 minutes. Increase time before increasing intensity.  Take Less Medication Yes   Intervention Learn your risk factors and begin the lifestyle modifications for risk factor control during your time in the program.   Understand more about Heart/Pulmonary Disease. Yes   Intervention While in program utilize professionals for any questions, and attend the education sessions. Great websites to use are www.americanheart.org or www.lung.org for reliable information.   Diabetes Yes   Goal Blood glucose control identified by blood glucose values, HgbA1C. Participant verbalizes understanding of the signs/symptoms of hyper/hypo glycemia, proper foot care and importance of medication and nutrition plan for blood glucose control.   Intervention Provide nutrition & aerobic exercise along with prescribed medications to achieve blood glucose in normal ranges: Fasting 65-99 mg/dL   Hypertension Yes   Goal Participant will see blood pressure controlled within the values of 140/32m/Hg or within value directed by their physician.   Intervention Provide nutrition & aerobic exercise along with prescribed medications to achieve BP 140/90 or less.   Lipids Yes   Goal Cholesterol controlled with medications as prescribed,  with individualized exercise RX and with personalized nutrition plan. Value goals: LDL < 729m HDL > 4015mParticipant states understanding of desired cholesterol values and following prescriptions.   Intervention Provide nutrition & aerobic exercise along with prescribed medications to achieve LDL <73m54mDL >40mg24mStress No   Personal Goal Other Yes   Personal Goal Improve balance   Intervention Other  Throughout program increase endurance, overall conditioning, strength and flexibility.        Personal Goals and Risk Factors Review:    Personal Goals Discharge (Final Personal Goals and Risk Factors Review):     Comments: [facility] will help him.

## 2015-08-04 NOTE — Progress Notes (Signed)
Daily Session Note  Patient Details  Name: Steve Phillips MRN: [id number] Date of Birth: March 03, 1946 Referring Provider:  [name], MD  Encounter Date: 08/04/2015  Check In:     Session Check In - 08/04/15 1626    Check-In   Staff Present Lestine Box BS, ACSM EP-C, Exercise Physiologist;Carroll Enterkin RN, BSN;Mary Kellie Shropshire RN   ER physicians immediately available to respond to emergencies See telemetry face sheet for immediately available ER MD   Medication changes reported     No   Fall or balance concerns reported    No   Warm-up and Cool-down Performed on first and last piece of equipment   VAD Patient? No   Pain Assessment   Currently in Pain? No/denies         Goals Met:  Proper associated with RPD/PD & O2 Sat Exercise tolerated well No report of cardiac concerns or symptoms Strength training completed today  Goals Unmet:  Not Applicable  Goals Comments:   Dr. Emily Filbert is Medical Director for [place] and [facility].

## 2015-08-08 ENCOUNTER — Encounter: Payer: Medicare Other | Admitting: *Deleted

## 2015-08-08 DIAGNOSIS — Z9861 Coronary angioplasty status: Secondary | ICD-10-CM | POA: Diagnosis not present

## 2015-08-08 DIAGNOSIS — I2510 Atherosclerotic heart disease of native coronary artery without angina pectoris: Secondary | ICD-10-CM

## 2015-08-08 DIAGNOSIS — Z955 Presence of coronary angioplasty implant and graft: Secondary | ICD-10-CM | POA: Diagnosis not present

## 2015-08-08 NOTE — Progress Notes (Signed)
Daily Session Note  Patient Details  Name: MARIOS GAISER MRN: [id number] Date of Birth: 02/11/1946 Referring Provider:  [name], MD  Encounter Date: 08/08/2015  Check In:     Session Check In - 08/08/15 1629    Check-In   Staff Present Heath Lark RN, BSN, CCRP;Kelly Hayes BS, ACSM CEP Exercise Physiologist;Steven Way BS, ACSM EP-C, Exercise Physiologist   ER physicians immediately available to respond to emergencies See telemetry face sheet for immediately available ER MD   Medication changes reported     No   Fall or balance concerns reported    No   Warm-up and Cool-down Performed on first and last piece of equipment   VAD Patient? No   Pain Assessment   Currently in Pain? No/denies   Multiple Pain Sites No         Goals Met:  Independence with exercise equipment Exercise tolerated well No report of cardiac concerns or symptoms Strength training completed today  Personal goals reviewed  Goals Unmet:  Not Applicable  Goals Comments:    Dr. Emily Filbert is Medical Director for [place] [facility].

## 2015-08-10 ENCOUNTER — Telehealth: Payer: Self-pay | Admitting: *Deleted

## 2015-08-10 ENCOUNTER — Encounter: Payer: Medicare Other | Admitting: *Deleted

## 2015-08-10 DIAGNOSIS — Z955 Presence of coronary angioplasty implant and graft: Secondary | ICD-10-CM | POA: Diagnosis not present

## 2015-08-10 DIAGNOSIS — I2510 Atherosclerotic heart disease of native coronary artery without angina pectoris: Secondary | ICD-10-CM | POA: Diagnosis not present

## 2015-08-10 DIAGNOSIS — Z9861 Coronary angioplasty status: Principal | ICD-10-CM

## 2015-08-10 NOTE — Telephone Encounter (Signed)
Needs a follow up visit then 31min

## 2015-08-10 NOTE — Telephone Encounter (Signed)
pts wife called states Zaleplon is not effective, states pt is still unable to sleep.  Please advise

## 2015-08-10 NOTE — Telephone Encounter (Signed)
Pt scheduled  

## 2015-08-10 NOTE — Progress Notes (Signed)
Daily Session Note  Patient Details  Name: Steve Phillips MRN: [id number] Date of Birth: [birth year] Referring Provider:  [name], MD  Encounter Date: 08/10/2015  Check In:     Session Check In - 08/10/15 1615    Check-In   Staff Present Gerlene Burdock RN, BSN;Diane Joya Gaskins RN, BSN;Renee Dillard Essex MS, ACSM CEP Exercise Physiologist   ER physicians immediately available to respond to emergencies See telemetry face sheet for immediately available ER MD   Medication changes reported     No   Fall or balance concerns reported    No   Warm-up and Cool-down Performed on first and last piece of equipment   VAD Patient? No   Pain Assessment   Currently in Pain? No/denies   Multiple Pain Sites No         Goals Met:  Exercise tolerated well No report of cardiac concerns or symptoms Strength training completed today  Goals Unmet:  Not Applicable  Goals Comments:    Dr. Emily Filbert is Medical Director for [place] [facility].

## 2015-08-11 ENCOUNTER — Encounter: Payer: Self-pay | Admitting: Internal Medicine

## 2015-08-11 ENCOUNTER — Ambulatory Visit (INDEPENDENT_AMBULATORY_CARE_PROVIDER_SITE_OTHER): Payer: Medicare Other | Admitting: Internal Medicine

## 2015-08-11 VITALS — BP 148/82 | HR 77 | Temp 98.0°F | Ht 72.0 in | Wt 248.4 lb

## 2015-08-11 DIAGNOSIS — E1140 Type 2 diabetes mellitus with diabetic neuropathy, unspecified: Secondary | ICD-10-CM | POA: Diagnosis not present

## 2015-08-11 DIAGNOSIS — G4700 Insomnia, unspecified: Secondary | ICD-10-CM

## 2015-08-11 DIAGNOSIS — Z955 Presence of coronary angioplasty implant and graft: Secondary | ICD-10-CM | POA: Diagnosis not present

## 2015-08-11 DIAGNOSIS — E1149 Type 2 diabetes mellitus with other diabetic neurological complication: Secondary | ICD-10-CM

## 2015-08-11 DIAGNOSIS — I10 Essential (primary) hypertension: Secondary | ICD-10-CM | POA: Diagnosis not present

## 2015-08-11 DIAGNOSIS — Z9861 Coronary angioplasty status: Principal | ICD-10-CM

## 2015-08-11 DIAGNOSIS — I200 Unstable angina: Secondary | ICD-10-CM

## 2015-08-11 DIAGNOSIS — I2510 Atherosclerotic heart disease of native coronary artery without angina pectoris: Secondary | ICD-10-CM

## 2015-08-11 MED ORDER — ZOLPIDEM TARTRATE 5 MG PO TABS: 5.0000 mg | ORAL_TABLET | Freq: Every evening | ORAL | Status: DC | PRN

## 2015-08-11 NOTE — Assessment & Plan Note (Signed)
BP Readings from Last 3 Encounters:  08/11/15 148/82  08/02/15 104/60  07/21/15 162/82   BP generally well controlled. Continue current medications.

## 2015-08-11 NOTE — Progress Notes (Signed)
Subjective:    Patient ID: Steve Phillips, male    DOB: [birth year], 68 y.o.   MRN: [id number]  HPI  69YO male presents for follow up.  Insomnia - Last visit started on Sonata. No improvement with this medication. Actually felt more awake on this medication. Feeling anxious at night. This this is affecting sleep.  DM - Recently seen for follow up of DM. Started on regular Insulin and dose of Lantus decreased.    Wt Readings from Last 3 Encounters:  08/11/15 248 lb 6 oz (112.662 kg)  08/02/15 245 lb (111.131 kg)  07/28/15 247 lb 1.6 oz (112.084 kg)   BP Readings from Last 3 Encounters:  08/11/15 148/82  08/02/15 104/60  07/21/15 162/82      Past Medical History  Diagnosis Date  . Shingles   . Hyperlipidemia   . Lower leg pain     chronic ,left leg  . Acute angina   . Depression   . CAD (coronary artery disease)     s/p multiple PCI with stent RCA,LAD and obtuse marginal,followed @ [facility] on the accord study.., cath 02/2012 90% D1, s/p drug-eluting stent Dr. Fletcher [name]  . Shortness of breath   . Hypertension     dr Jerilynn Mages Aud[place]on     labauer in [place]  . Bell's palsy   . Left leg pain   . 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 Type II diabetes mellitus   . GERD (gastroesophageal reflux disease)   . Headache(784.0)     "sometimes daily; sometimes weekly" (05/16/2015)  . Stroke 2015    denies residual on 05/16/2015  . Chronic lower back pain     "for 3 months now" (05/16/2015)  . Diabetic peripheral neuropathy     "feet"  . Falls frequently     "in the last month or so" (05/16/2015)  . Vascular parkinsonism dx'd 10/2014   Family History  Problem Relation Age of Onset  . Cancer Mother     BREAST  . Heart disease Mother     STENT  . Cancer Father     THROAT  . Heart disease Sister     STENT; HTN  . Heart attack Brother   . Hypertension Brother   . Hypertension Sister    Past Surgical History  Procedure Laterality Date  . Appendectomy    . Tibia fracture  surgery Left ~ 2002  . Foot fracture surgery Left ~ 2002    "Jone's fracture"  . Testicle surgery Left 2000's    "put drainage tube in for infection"  . Back surgery    . Anterior cervical decomp/discectomy fusion N/A 04/30/2013    Procedure: ANTERIOR CERVICAL DECOMPRESSION/DISCECTOMY FUSION 2 LEVELS;  Surgeon: Faythe Ghee, MD;  Location: [facility];  Service: Neurosurgery;  Laterality: N/A;  Cervical four-five, Cervical six-seven anterior cervical decompression fusion with trabecular metal cage and plate  . Left heart catheterization with coronary angiogram N/A 02/23/2015    Procedure: LEFT HEART CATHETERIZATION WITH CORONARY ANGIOGRAM;  Surgeon: [name], MD;  Location: [place] [facility];  Service: Cardiovascular;  Laterality: N/A;  . Cholecystectomy open  1980's  . Cardiac catheterization  02/2012  . Coronary angioplasty with stent placement  ~ 2000-02/2015    "he has a total of 6 stents" (05/16/2015  . Fracture surgery    . Squamous cell carcinoma excision Left 2014    "arm"   Social History   Social History  .  Marital Status: Married    Spouse Name: N/A  . Number of Children: N/A  . Years of Education: N/A   Social History Main Topics  . Smoking status: Former Smoker -- 0.12 packs/day for 10 years    Types: Cigarettes    Quit date: 11/08/1971  . Smokeless tobacco: Never Used  . Alcohol Use: No  . Drug Use: No  . Sexual Activity: Not Currently   Other Topics Concern  . None   Social History Narrative   Used to work with [employer] until the 1990s and then became disabled and progressively worse   Currently lives with his wife for 20 years   States he wishes to be a full code    Review of Systems  Constitutional: Negative for fever, chills, activity change, appetite change, fatigue and unexpected weight change.  Eyes: Negative for visual disturbance.  Respiratory: Negative for cough and shortness of breath.   Cardiovascular: Negative for chest pain,  palpitations and leg swelling.  Gastrointestinal: Negative for nausea, vomiting, abdominal pain, diarrhea, constipation and abdominal distention.  Genitourinary: Negative for dysuria, urgency and difficulty urinating.  Musculoskeletal: Positive for gait problem. Negative for myalgias and arthralgias.  Skin: Negative for color change and rash.  Neurological: Positive for weakness.  Hematological: Negative for adenopathy.  Psychiatric/Behavioral: Negative for sleep disturbance and dysphoric mood. The patient is not nervous/anxious.        Objective:    BP 148/82 mmHg  Pulse 77  Temp(Src) 98 F (36.7 C) (Oral)  Ht 6' (1.829 m)  Wt 248 lb 6 oz (112.662 kg)  BMI 33.68 kg/m2  SpO2 97% Physical Exam  Constitutional: He is oriented to person, place, and time. He appears well-developed and well-nourished. No distress.  HENT:  Head: Normocephalic and atraumatic.  Right Ear: External ear normal.  Left Ear: External ear normal.  Nose: Nose normal.  Mouth/Throat: Oropharynx is clear and moist.  Eyes: Conjunctivae and EOM are normal. Pupils are equal, round, and reactive to light. Right eye exhibits no discharge. Left eye exhibits no discharge. No scleral icterus.  Neck: Normal range of motion. Neck supple. No tracheal deviation present. No thyromegaly present.  Cardiovascular: Normal rate, regular rhythm and normal heart sounds.  Exam reveals no gallop and no friction rub.   No murmur heard. Pulmonary/Chest: Effort normal and breath sounds normal. No accessory muscle usage. No tachypnea. No respiratory distress. He has no decreased breath sounds. He has no wheezes. He has no rhonchi. He has no rales. He exhibits no tenderness.  Musculoskeletal: Normal range of motion. He exhibits no edema.  Lymphadenopathy:    He has no cervical adenopathy.  Neurological: He is alert and oriented to person, place, and time. No cranial nerve deficit. Coordination normal.  Skin: Skin is warm and dry. No rash  noted. He is not diaphoretic. No erythema. No pallor.  Psychiatric: He has a normal mood and affect. His behavior is normal. Judgment and thought content normal.          Assessment & Plan:   Problem List Items Addressed This Visit      High   DM (diabetes mellitus), type 2 with neurological complications (Chronic)    BG recently improved. Reviewed notes form Endocrinology. Will continue current medications.       Hypertension (Chronic)    BP Readings from Last 3 Encounters:  08/11/15 148/82  08/02/15 104/60  07/21/15 162/82   BP generally well controlled. Continue current medications.  Unprioritized   Insomnia - Primary    Persistent insomnia, likely exacerbated by anxiety.  No improvement with Sonata. Discussed several options for management. Will start Ambien 5mg  daily at bedtime. Discussed potential risks of this medication. Follow up in 2-4 weeks as scheduled.          Return in about 4 weeks (around 09/08/2015) for Recheck.

## 2015-08-11 NOTE — Progress Notes (Signed)
Daily Session Note  Patient Details  Name: SHAFER SWAMY MRN: [id number] Date of Birth: 11-16-1946 Referring Provider:  [name], MD  Encounter Date: 08/11/2015  Check In:     Session Check In - 08/11/15 1618    Check-In   Staff Present Lestine Box BS, ACSM EP-C, Exercise Physiologist;Carroll Enterkin RN, BSN;Diane Joya Gaskins RN, BSN   ER physicians immediately available to respond to emergencies See telemetry face sheet for immediately available ER MD   Medication changes reported     No   Fall or balance concerns reported    No   Warm-up and Cool-down Performed on first and last piece of equipment   VAD Patient? No   Pain Assessment   Currently in Pain? No/denies         Goals Met:  Proper associated with RPD/PD & O2 Sat Exercise tolerated well No report of cardiac concerns or symptoms Strength training completed today  Goals Unmet:  Not Applicable  Goals Comments:    Dr. Emily Filbert is Medical Director for [facility] and [facility].

## 2015-08-11 NOTE — Assessment & Plan Note (Signed)
Persistent insomnia, likely exacerbated by anxiety.  No improvement with Sonata. Discussed several options for management. Will start Ambien 5mg  daily at bedtime. Discussed potential risks of this medication. Follow up in 2-4 weeks as scheduled.

## 2015-08-11 NOTE — Assessment & Plan Note (Signed)
BG recently improved. Reviewed notes form Endocrinology. Will continue current medications.

## 2015-08-11 NOTE — Progress Notes (Signed)
Pre visit review using our clinic review tool, if applicable. No additional management support is needed unless otherwise documented below in the visit note. 

## 2015-08-11 NOTE — Patient Instructions (Signed)
Start Ambien 5mg  daily at bedtime to help with sleep.  Follow up in 4 weeks or sooner as needed.

## 2015-08-15 ENCOUNTER — Ambulatory Visit (INDEPENDENT_AMBULATORY_CARE_PROVIDER_SITE_OTHER): Payer: Medicare Other | Admitting: Cardiovascular Disease

## 2015-08-15 ENCOUNTER — Encounter: Payer: Self-pay | Admitting: Cardiovascular Disease

## 2015-08-15 VITALS — BP 153/76 | HR 62 | Ht 72.0 in | Wt 243.0 lb

## 2015-08-15 DIAGNOSIS — I200 Unstable angina: Secondary | ICD-10-CM

## 2015-08-15 DIAGNOSIS — I10 Essential (primary) hypertension: Secondary | ICD-10-CM | POA: Diagnosis not present

## 2015-08-15 DIAGNOSIS — Z9861 Coronary angioplasty status: Secondary | ICD-10-CM | POA: Diagnosis not present

## 2015-08-15 DIAGNOSIS — I2510 Atherosclerotic heart disease of native coronary artery without angina pectoris: Secondary | ICD-10-CM

## 2015-08-15 DIAGNOSIS — E669 Obesity, unspecified: Secondary | ICD-10-CM

## 2015-08-15 DIAGNOSIS — E785 Hyperlipidemia, unspecified: Secondary | ICD-10-CM | POA: Diagnosis not present

## 2015-08-15 DIAGNOSIS — Z955 Presence of coronary angioplasty implant and graft: Secondary | ICD-10-CM | POA: Diagnosis not present

## 2015-08-15 NOTE — Assessment & Plan Note (Signed)
He is doing well overall with no symptoms suggestive of angina. Continue medical therapy. 

## 2015-08-15 NOTE — Progress Notes (Signed)
HPI  This is a 69 year old male who is here today for followup visit regarding coronary artery disease. He has known history of coronary artery disease status post multiple PCI in the past at [facility]. He presented in April of 2013 with chest pain. He underwent cardiac catheterization which showed patent stents. However, there was a 90% stenosis in the proximal first diagonal. There was 40% disease in the mid LAD and moderate disease in the left circumflex. He underwent an angioplasty and drug-eluting stent placement to the diagonal without complications.  he has known history of refractory hypertension. Renal artery duplex ultrasound showed no evidence of renal artery stenosis. He had worsening angina in April.I proceeded with cardiac catheterization  which showed patent stents in the RCA, left circumflex and first diagonal. There was severe in-stent restenosis in the mid LAD. I performed successful angioplasty and drug-eluting stent placement to the mid LAD. The patient was noted to have diffuse diabetic vessels.  He has suffered from severe back pain since April which required multiple hospital admissions. However, this has improved recently with physical therapy. He is overall feeling significantly better with no chest pain. He denies any back pain. He lost about 20 pounds by watching his diet more carefully and being more active. His blood pressure was low recently and thus I discontinued amlodipine and switched lisinopril to once daily instead of twice daily.  Allergies  Allergen Reactions  . Morphine And Related Nausea And Vomiting    Other Reaction: Intolerance  . Codeine Nausea Only    Derivatives -Nausea.  . Fentanyl     nausea  . Tramadol Other (See Comments)    unknown  . Trazodone Nausea Only  . Trazodone And Nefazodone Nausea Only     Current Outpatient Prescriptions on File Prior to Visit  Medication Sig Dispense Refill  . acetaminophen (TYLENOL) 325 MG tablet  Take 2 tablets (650 mg total) by mouth every 4 (four) hours as needed for headache or mild pain.    Marland Kitchen aspirin EC 81 MG tablet Take 1 tablet (81 mg total) by mouth daily.    . carbidopa-levodopa (SINEMET IR) 25-100 MG per tablet Take 2 tablets by mouth 3 (three) times daily.     . carvedilol (COREG) 25 MG tablet TAKE ONE TABLET BY MOUTH TWICE DAILY 180 tablet 1  . clopidogrel (PLAVIX) 75 MG tablet Take 1 tablet (75 mg total) by mouth daily with breakfast. 90 tablet 1  . cyanocobalamin (,VITAMIN B-12,) 1000 MCG/ML injection Inject 1,000 mcg into the muscle every 30 (thirty) days.     . DULoxetine (CYMBALTA) 60 MG capsule Take 1 capsule (60 mg total) by mouth daily. TAKE 1 CAPSULE DAILY 30 capsule 1  . gabapentin (NEURONTIN) 300 MG capsule Take 300 mg by mouth 2 (two) times daily.    Marland Kitchen HYDROcodone-acetaminophen (NORCO/VICODIN) 5-325 MG per tablet 1-2 po tid prn Earliest Fill Date: 08/04/15    . insulin glargine (LANTUS) 100 UNIT/ML injection Inject 55 Units into the skin 2 (two) times daily.     . insulin regular (HUMULIN R) 100 units/mL injection Inject 0.1-0.14 mLs (10-14 Units total) into the skin 3 (three) times daily before meals. 20 mL 1  . lisinopril-hydrochlorothiazide (PRINZIDE,ZESTORETIC) 20-12.5 MG per tablet Take 1 tablet by mouth 2 (two) times daily. (Patient taking differently: Take 1 tablet by mouth daily. ) 180 tablet 1  . nitroGLYCERIN (NITROSTAT) 0.4 MG SL tablet Place 1 tablet (0.4 mg total) under the tongue every 5 (  five) minutes as needed for chest pain. 30 tablet 6  . pantoprazole (PROTONIX) 40 MG tablet Take 1 tablet (40 mg total) by mouth 2 (two) times daily. 180 tablet 3  . simvastatin (ZOCOR) 20 MG tablet Take 1 tablet (20 mg total) by mouth at bedtime. 90 tablet 3   No current facility-administered medications on file prior to visit.     Past Medical History  Diagnosis Date  . Shingles   . Hyperlipidemia   . Lower leg pain     chronic ,left leg  . Acute angina   .  Depression   . CAD (coronary artery disease)     s/p multiple PCI with stent RCA,LAD and obtuse marginal,followed @ [facility] on the accord study.., cath 02/2012 90% D1, s/p drug-eluting stent Dr. Fletcher [name]  . Shortness of breath   . Hypertension     dr Jerilynn Mages [place]     labauer in [place]  . Bell's palsy   . Left leg pain   . 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 Type II diabetes mellitus   . GERD (gastroesophageal reflux disease)   . Headache(784.0)     "sometimes daily; sometimes weekly" (05/16/2015)  . Stroke 2015    denies residual on 05/16/2015  . Chronic lower back pain     "for 3 months now" (05/16/2015)  . Diabetic peripheral neuropathy     "feet"  . Falls frequently     "in the last month or so" (05/16/2015)  . Vascular parkinsonism dx'd 10/2014     Past Surgical History  Procedure Laterality Date  . Appendectomy    . Tibia fracture surgery Left ~ 2002  . Foot fracture surgery Left ~ 2002    "Jone's fracture"  . Testicle surgery Left 2000's    "put drainage tube in for infection"  . Back surgery    . Anterior cervical decomp/discectomy fusion N/A 04/30/2013    Procedure: ANTERIOR CERVICAL DECOMPRESSION/DISCECTOMY FUSION 2 LEVELS;  Surgeon: Faythe Ghee, MD;  Location: [facility];  Service: Neurosurgery;  Laterality: N/A;  Cervical four-five, Cervical six-seven anterior cervical decompression fusion with trabecular metal cage and plate  . Left heart catheterization with coronary angiogram N/A 02/23/2015    Procedure: LEFT HEART CATHETERIZATION WITH CORONARY ANGIOGRAM;  Surgeon: [name], MD;  Location: [place] CATH LAB;  Service: Cardiovascular;  Laterality: N/A;  . Cholecystectomy open  1980's  . Cardiac catheterization  02/2012  . Coronary angioplasty with stent placement  ~ 2000-02/2015    "he has a total of 6 stents" (05/16/2015  . Fracture surgery    . Squamous cell carcinoma excision Left 2014    "arm"     Family History  Problem Relation Age of Onset  .  Cancer Mother     BREAST  . Heart disease Mother     STENT  . Cancer Father     THROAT  . Heart disease Sister     STENT; HTN  . Heart attack Brother   . Hypertension Brother   . Hypertension Sister      Social History   Social History  . Marital Status: Married    Spouse Name: N/A  . Number of Children: N/A  . Years of Education: N/A   Occupational History  . Not on file.   Social History Main Topics  . Smoking status: Former Smoker -- 0.12 packs/day for 10 years    Types: Cigarettes    Quit date:  11/08/1971  . Smokeless tobacco: Never Used  . Alcohol Use: No  . Drug Use: No  . Sexual Activity: Not Currently   Other Topics Concern  . Not on file   Social History Narrative   Used to work with [employer] until the 1990s and then became disabled and progressively worse   Currently lives with his wife for 1 years   States he wishes to be a full code      PHYSICAL EXAM   BP 153/76 mmHg  Pulse 62  Ht 6' (1.829 m)  Wt 243 lb (110.224 kg)  BMI 32.95 kg/m2 Constitutional: He is oriented to person, place, and time. He appears well-developed and well-nourished. No distress.  HENT: No nasal discharge.  Head: Normocephalic and atraumatic.  Eyes: Pupils are equal and round. Right eye exhibits no discharge. Left eye exhibits no discharge.  Neck: Normal range of motion. Neck supple. No JVD present. No thyromegaly present.  Cardiovascular: Normal rate, regular rhythm, normal heart sounds and. Exam reveals no gallop and no friction rub. No murmur heard.  Pulmonary/Chest: Effort normal and breath sounds normal. No stridor. No respiratory distress. He has no wheezes. He has no rales. He exhibits no tenderness.  Abdominal: Soft. Bowel sounds are normal. He exhibits no distension. There is no tenderness. There is no rebound and no guarding.  Musculoskeletal: Normal range of motion. He exhibits no edema and no tenderness.  Neurological: He is alert and oriented to  person, place, and time. Coordination normal.  Skin: Skin is warm and dry. No rash noted. He is not diaphoretic. No erythema. No pallor.  Psychiatric: He has a normal mood and affect. His behavior is normal. Judgment and thought content normal.         ASSESSMENT AND PLAN

## 2015-08-15 NOTE — Assessment & Plan Note (Signed)
Lab Results  Component Value Date   CHOL 94 03/05/2015   HDL 30* 03/05/2015   LDLCALC 50 03/05/2015        TRIG 70 03/05/2015        LDL was at target with current dose of simvastatin.

## 2015-08-15 NOTE — Patient Instructions (Signed)
Medication Instructions:  Your physician recommends that you continue on your current medications as directed. Please refer to the Current Medication list given to you today.   Labwork: None   Testing/Procedures: None   Follow-Up: Your physician wants you to follow-up in: six months with Dr. Fletcher [name].  You will receive a reminder letter in the mail two months in advance. If you don't receive a letter, please call our office to schedule the follow-up appointment.   Any Other Special Instructions Will Be Listed Below (If Applicable).

## 2015-08-15 NOTE — Progress Notes (Signed)
Daily Session Note  Patient Details  Name: Steve Phillips MRN: [id number] Date of Birth: 1946/02/22 Referring Provider:  [name], MD  Encounter Date: 08/15/2015  Check In:     Session Check In - 08/15/15 1628    Check-In   Staff Present Lestine Box BS, ACSM EP-C, Exercise Physiologist;Carroll Enterkin RN, BSN;Susanne Bice RN, BSN, [place]   ER physicians immediately available to respond to emergencies See telemetry face sheet for immediately available ER MD   Medication changes reported     No   Fall or balance concerns reported    No   Warm-up and Cool-down Performed on first and last piece of equipment   VAD Patient? No   Pain Assessment   Currently in Pain? No/denies         Goals Met:  Proper associated with RPD/PD & O2 Sat Exercise tolerated well No report of cardiac concerns or symptoms Strength training completed today  Goals Unmet:  Not Applicable  Goals Comments:    Dr. Emily Filbert is Medical Director for [facility] and [facility].

## 2015-08-15 NOTE — Assessment & Plan Note (Signed)
Blood pressure is mildly elevated but has been reasonably controlled recently. Continue to monitor. I elected not to increase any of his medications given that he had recent hypotension.

## 2015-08-15 NOTE — Assessment & Plan Note (Signed)
I encouraged him to stay active and he is going to start water aerobics. There is no contraindication from a cardiac standpoint.

## 2015-08-17 ENCOUNTER — Encounter: Payer: Medicare Other | Admitting: *Deleted

## 2015-08-17 DIAGNOSIS — I2510 Atherosclerotic heart disease of native coronary artery without angina pectoris: Secondary | ICD-10-CM | POA: Diagnosis not present

## 2015-08-17 DIAGNOSIS — Z9861 Coronary angioplasty status: Secondary | ICD-10-CM | POA: Diagnosis not present

## 2015-08-17 DIAGNOSIS — Z955 Presence of coronary angioplasty implant and graft: Secondary | ICD-10-CM | POA: Diagnosis not present

## 2015-08-17 NOTE — Progress Notes (Signed)
Daily Session Note  Patient Details  Name: Steve Phillips MRN: [id number] Date of Birth: 15-Apr-1946 Referring Provider:  [name], MD  Encounter Date: 08/17/2015  Check In:     Session Check In - 08/17/15 1743    Check-In   Staff Present Gerlene Burdock RN, BSN;Renee Dillard [place] MS, ACSM CEP Exercise Physiologist;Diane Mariana Arn, BSN   ER physicians immediately available to respond to emergencies See telemetry face sheet for immediately available ER MD   Medication changes reported     No   Fall or balance concerns reported    No   Warm-up and Cool-down Performed on first and last piece of equipment   VAD Patient? No   Pain Assessment   Currently in Pain? No/denies   Multiple Pain Sites No           Exercise Prescription Changes - 08/17/15 1700    Exercise Review   Progression Yes   Response to Exercise   Symptoms None   Comments Reviewed individualized exercise prescription and made increases per departmental policy. Exercise increases were discussed with the patient and they were able to perform the new work loads without issue (no signs or symptoms).    Duration Progress to 30 minutes of continuous aerobic without signs/symptoms of physical distress   Intensity Rest + 30   Progression Continue progressive overload as per policy without signs/symptoms or physical distress.   Resistance Training   Training Prescription Yes   Weight 4   Reps 10-15   Interval Training   Interval Training No   NuStep   Level 4   Watts 35   Minutes 20      Goals Met:  Independence with exercise equipment Exercise tolerated well Personal goals reviewed No report of cardiac concerns or symptoms Strength training completed today  Goals Unmet:  Not Applicable  Goals Comments: Reviewed individualized exercise prescription and made increases per departmental policy. Exercise increases were discussed with the patient and they were able to perform the new work loads without issue  (no signs or symptoms).     Dr. Emily Filbert is Medical Director for [facility] and [facility].

## 2015-08-18 ENCOUNTER — Encounter: Payer: Medicare Other | Admitting: *Deleted

## 2015-08-18 DIAGNOSIS — Z955 Presence of coronary angioplasty implant and graft: Secondary | ICD-10-CM | POA: Diagnosis not present

## 2015-08-18 DIAGNOSIS — Z9861 Coronary angioplasty status: Principal | ICD-10-CM

## 2015-08-18 DIAGNOSIS — I2510 Atherosclerotic heart disease of native coronary artery without angina pectoris: Secondary | ICD-10-CM

## 2015-08-18 NOTE — Progress Notes (Signed)
Cardiac Individual Treatment Plan  Patient Details  Name: Steve Phillips MRN: [id number] Date of Birth: 1946/04/02 Referring Provider:  [name], MD  Initial Encounter Date:    Visit Diagnosis: CAD S/P percutaneous coronary angioplasty  Patient's Home Medications on Admission:  Current outpatient prescriptions:  .  acetaminophen (TYLENOL) 325 MG tablet, Take 2 tablets (650 mg total) by mouth every 4 (four) hours as needed for headache or mild pain., Disp: , Rfl:  .  aspirin EC 81 MG tablet, Take 1 tablet (81 mg total) by mouth daily., Disp: , Rfl:  .  carbidopa-levodopa (SINEMET IR) 25-100 MG per tablet, Take 2 tablets by mouth 3 (three) times daily. , Disp: , Rfl:  .  carvedilol (COREG) 25 MG tablet, TAKE ONE TABLET BY MOUTH TWICE DAILY, Disp: 180 tablet, Rfl: 1 .  clopidogrel (PLAVIX) 75 MG tablet, Take 1 tablet (75 mg total) by mouth daily with breakfast., Disp: 90 tablet, Rfl: 1 .  cyanocobalamin (,VITAMIN B-12,) 1000 MCG/ML injection, Inject 1,000 mcg into the muscle every 30 (thirty) days. , Disp: , Rfl:  .  DULoxetine (CYMBALTA) 60 MG capsule, Take 1 capsule (60 mg total) by mouth daily. TAKE 1 CAPSULE DAILY, Disp: 30 capsule, Rfl: 1 .  gabapentin (NEURONTIN) 300 MG capsule, Take 300 mg by mouth 2 (two) times daily., Disp: , Rfl:  .  HYDROcodone-acetaminophen (NORCO/VICODIN) 5-325 MG per tablet, 1-2 po tid prn Earliest Fill Date: 08/04/15, Disp: , Rfl:  .  insulin glargine (LANTUS) 100 UNIT/ML injection, Inject 55 Units into the skin 2 (two) times daily. , Disp: , Rfl:  .  insulin regular (HUMULIN R) 100 units/mL injection, Inject 0.1-0.14 mLs (10-14 Units total) into the skin 3 (three) times daily before meals., Disp: 20 mL, Rfl: 1 .  lisinopril-hydrochlorothiazide (PRINZIDE,ZESTORETIC) 20-12.5 MG per tablet, Take 1 tablet by mouth 2 (two) times daily. (Patient taking differently: Take 1 tablet by mouth daily. ), Disp: 180 tablet, Rfl: 1 .  nitroGLYCERIN (NITROSTAT) 0.4 MG SL  tablet, Place 1 tablet (0.4 mg total) under the tongue every 5 (five) minutes as needed for chest pain., Disp: 30 tablet, Rfl: 6 .  pantoprazole (PROTONIX) 40 MG tablet, Take 1 tablet (40 mg total) by mouth 2 (two) times daily., Disp: 180 tablet, Rfl: 3 .  simvastatin (ZOCOR) 20 MG tablet, Take 1 tablet (20 mg total) by mouth at bedtime., Disp: 90 tablet, Rfl: 3  Past Medical History: Past Medical History  Diagnosis Date  . Shingles   . Hyperlipidemia   . Lower leg pain     chronic ,left leg  . Acute angina   . Depression   . CAD (coronary artery disease)     s/p multiple PCI with stent RCA,LAD and obtuse marginal,followed @ [facility] on the accord study.., cath 02/2012 90% D1, s/p drug-eluting stent Dr. Fletcher [name]  . Shortness of breath   . Hypertension     dr Jerilynn Mages Audelia Acton     [place]auer in [place]  . Bell's palsy   . Left leg pain   . 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 Type II diabetes mellitus   . GERD (gastroesophageal reflux disease)   . Headache(784.0)     "sometimes daily; sometimes weekly" (05/16/2015)  . Stroke 2015    denies residual on 05/16/2015  . Chronic lower back pain     "for 3 months now" (05/16/2015)  . Diabetic peripheral neuropathy     "feet"  . Falls frequently     "  in the last month or so" (05/16/2015)  . Vascular parkinsonism dx'd 10/2014    Tobacco Use: History  Smoking status  . Former Smoker -- 0.12 packs/day for 10 years  . Types: Cigarettes  . Quit date: 11/08/1971  Smokeless tobacco  . Never Used    Labs: Recent Review Flowsheet Data    Labs for ITP Cardiac and Pulmonary Rehab Latest Ref Rng 11/16/2014 02/16/2015 03/05/2015 05/06/2015 07/21/2015   Cholestrol - 189 - 94 - -   LDLCALC - - - 50 - -   LDLDIRECT - 116.0 - - - -   HDL - 38.90(L) - 30(L) - -   Trlycerides - 306.0(H) - 70 - -   Hemoglobin A1c 4.6 - 6.5 % 9.7(H) 9.1(H) 8.7(H) 9.1(H) 7.2(H)       Exercise Target Goals:    Exercise Program Goal: Individual exercise prescription  set with THRR, safety & activity barriers. Participant demonstrates ability to understand and report RPE using BORG scale, to self-measure pulse accurately, and to acknowledge the importance of the exercise prescription.  Exercise Prescription Goal: Starting with aerobic activity 30 plus minutes a day, 3 days per week for initial exercise prescription. Provide home exercise prescription and guidelines that participant acknowledges understanding prior to discharge.  Activity Barriers & Risk Stratification:     Activity Barriers & Risk Stratification - 07/28/15 1826    Activity Barriers & Risk Stratification   Activity Barriers Back Problems;Balance Concerns;Assistive Device  Uses walker to ambulate   Risk Stratification High      6 Minute Walk:     6 Minute Walk      07/28/15 1519       6 Minute Walk   Phase Initial     Distance 355 feet     Walk Time 6 minutes     Resting HR 57 bpm     Resting BP 122/70 mmHg     Max Ex. HR 107 bpm     Max Ex. BP 146/70 mmHg     RPE 13     Symptoms No        Initial Exercise Prescription:     Initial Exercise Prescription - 07/28/15 1500    Date of Initial Exercise Prescription   Date 07/28/15   Treadmill   MPH 1   Grade 0   Minutes 10   Recumbant Bike   Level 2   RPM 40   Watts 20   Minutes 10   NuStep   Level 2   Watts 40   Minutes 10   Arm Ergometer   Level 1   Watts 10   Minutes 10   REL-XR   Level 2   Watts 40   Minutes 10   Prescription Details   Frequency (times per week) 3   Duration Progress to 30 minutes of continuous aerobic without signs/symptoms of physical distress   Intensity   THRR REST +  30   Ratings of Perceived Exertion 11-15   Perceived Dyspnea 2-4   Progression Continue progressive overload as per policy without signs/symptoms or physical distress.   Resistance Training   Training Prescription Yes   Weight 2   Reps 10-15      Exercise Prescription Changes:     Exercise Prescription  Changes      08/01/15 0600 08/17/15 1700         Exercise Review   Progression No  Has not started program yet Yes      Response  to Exercise   Symptoms  None      Comments  Reviewed individualized exercise prescription and made increases per departmental policy. Exercise increases were discussed with the patient and they were able to perform the new work loads without issue (no signs or symptoms).       Duration  Progress to 30 minutes of continuous aerobic without signs/symptoms of physical distress      Intensity  Rest + 30      Progression  Continue progressive overload as per policy without signs/symptoms or physical distress.      Resistance Training   Training Prescription  Yes      Weight  4      Reps  10-15      Interval Training   Interval Training  No      NuStep   Level  4      Watts  35      Minutes  20         Discharge Exercise Prescription (Final Exercise Prescription Changes):     Exercise Prescription Changes - 08/17/15 1700    Exercise Review   Progression Yes   Response to Exercise   Symptoms None   Comments Reviewed individualized exercise prescription and made increases per departmental policy. Exercise increases were discussed with the patient and they were able to perform the new work loads without issue (no signs or symptoms).    Duration Progress to 30 minutes of continuous aerobic without signs/symptoms of physical distress   Intensity Rest + 30   Progression Continue progressive overload as per policy without signs/symptoms or physical distress.   Resistance Training   Training Prescription Yes   Weight 4   Reps 10-15   Interval Training   Interval Training No   NuStep   Level 4   Watts 35   Minutes 20      Nutrition:  Target Goals: Understanding of nutrition guidelines, daily intake of sodium <1580m, cholesterol <2049m calories 30% from fat and 7% or less from saturated fats, daily to have 5 or more servings of fruits and  vegetables.  Biometrics:     Pre Biometrics - 07/28/15 1523    Pre Biometrics   Height 6' (1.829 m)   Weight 247 lb 1.6 oz (112.084 kg)   Waist Circumference 46 inches   Hip Circumference 50 inches   Waist to Hip Ratio 0.92 %   BMI (Calculated) 33.6       Nutrition Therapy Plan and Nutrition Goals:     Nutrition Therapy & Goals - 08/10/15 1802    Nutrition Therapy   Diet 1700-1800kcal Diabetes diet   Drug/Food Interactions Statins/Certain Fruits   Fiber 30 grams   Whole Grain Foods 3 servings   Protein 7 ounces/day   Saturated Fats 13 max. grams   Fruits and Vegetables 8 servings/day   Personal Nutrition Goals   Personal Goal #1 Keep working to control food portions, especially starchy foods (fist-size or less)   Personal Goal #2 Eat plenty of vegetables and fruits (small-medium portions of fruits to control blood sugar)      Nutrition Discharge: Rate Your Plate Scores:     Rate Your Plate - 0917/40/8184481  Rate Your Plate Scores   Pre Score 62   Pre Score % 69 %      Nutrition Goals Re-Evaluation:   Psychosocial: Target Goals: Acknowledge presence or absence of depression, maximize coping skills, provide positive support system. Participant is able  to verbalize types and ability to use techniques and skills needed for reducing stress and depression.  Initial Review & Psychosocial Screening:     Initial Psych Review & Screening - 07/28/15 1912    Initial Review   Current issues with Current Sleep Concerns;Current Depression;Current Psychotropic Meds      Quality of Life Scores:     Quality of Life - 08/04/15 1734    Quality of Life Scores   Health/Function Pre 3.6 %   Socioeconomic Pre 20 %   Psych/Spiritual Pre 18 %   Family Pre 22.8 %   GLOBAL Pre 12.55 %      PHQ-9:     Recent Review Flowsheet Data    Depression screen [facility] 2/9 07/28/2015 03/11/2015 12/26/2012   Decreased Interest 1 0 0   Down, Depressed, Hopeless 1 0 0   PHQ - 2 Score 2 0  0   Altered sleeping 2 - -   Tired, decreased energy 3 - -   Change in appetite 1 - -   Feeling bad or failure about yourself  0 - -   Trouble concentrating 2 - -   Moving slowly or fidgety/restless 0 - -   Suicidal thoughts 0 - -   PHQ-9 Score 10 - -   Difficult doing work/chores Not difficult at all - -      Psychosocial Evaluation and Intervention:     Psychosocial Evaluation - 08/03/15 1720    Psychosocial Evaluation & Interventions   Interventions Stress management education;Relaxation education;Encouraged to exercise with the program and follow exercise prescription   Comments Counselor met with Mr. Orosz today for initial psychosocial evaluation.  He is a 69 year old who had 6 stents inserted over the summer and has spent several months in rehabiliation for nerve damage in his leg as a result.  He has a strong support system with a spouse of 27 years and several adult children living close by as well as active involvement in his local church community.  He reports difficulty sleeping since he was taken off a sleep aid by his Dr. and his appetite has decreased since the surgery.  He reports a history of depression and anxiety and has been addressing this through medication and talk therapy.  He states his current mood is positive and he is optimistic about this program.  His goals are to improve his balance, increase his energy and stamina and lose some more weight.  Counselor encouraged him to check with his Dr. or pharmacist re: an OTC sleep aid since the Melatonin he is using is not working.  Counselor also taught deep breathing and muscle relaxation techniques and will follow with him as needed.   Continued Psychosocial Services Needed --  Mr. Febles will benefit from the psychoeducational components of this program considering his history of depression and anxiety.  He also will benefit from consistent exercise and learning relaxation techniques.       Psychosocial  Re-Evaluation:   Vocational Rehabilitation: Provide vocational rehab assistance to qualifying candidates.   Vocational Rehab Evaluation & Intervention:     Vocational Rehab - 07/28/15 1827    Initial Vocational Rehab Evaluation & Intervention   Assessment shows need for Vocational Rehabilitation No  Patient is retired      Education: Education Goals: Education classes will be provided on a weekly basis, covering required topics. Participant will state understanding/return demonstration of topics presented.  Learning Barriers/Preferences:     Learning Barriers/Preferences - 07/28/15 1827  Learning Barriers/Preferences   Learning Barriers Hearing   Learning Preferences Group Instruction      Education Topics: General Nutrition Guidelines/Fats and Fiber: -Group instruction provided by verbal, written material, models and posters to present the general guidelines for heart healthy nutrition. Gives an explanation and review of dietary fats and fiber.   Controlling Sodium/Reading Food Labels: -Group verbal and written material supporting the discussion of sodium use in heart healthy nutrition. Review and explanation with models, verbal and written materials for utilization of the food label.   Exercise Physiology & Risk Factors: - Group verbal and written instruction with models to review the exercise physiology of the cardiovascular system and associated critical values. Details cardiovascular disease risk factors and the goals associated with each risk factor.   Aerobic Exercise & Resistance Training: - Gives group verbal and written discussion on the health impact of inactivity. On the components of aerobic and resistive training programs and the benefits of this training and how to safely progress through these programs.          Cardiac Rehab from 08/10/2015 in [facility] Cardiac Rehab   Date  08/01/15   Educator  SW   Instruction Review Code  2- meets goals/outcomes       Flexibility, Balance, General Exercise Guidelines: - Provides group verbal and written instruction on the benefits of flexibility and balance training programs. Provides general exercise guidelines with specific guidelines to those with heart or lung disease. Demonstration and skill practice provided.      Cardiac Rehab from 08/10/2015 in [facility] Cardiac Rehab   Date  08/08/15   Educator  SW   Instruction Review Code  2- meets goals/outcomes      Stress Management: - Provides group verbal and written instruction about the health risks of elevated stress, cause of high stress, and healthy ways to reduce stress.      Cardiac Rehab from 08/10/2015 in [facility] Cardiac Rehab   Date  08/10/15   Educator  Kathreen Cornfield   Instruction Review Code  2- meets goals/outcomes      Depression: - Provides group verbal and written instruction on the correlation between heart/lung disease and depressed mood, treatment options, and the stigmas associated with seeking treatment.   Anatomy & Physiology of the Heart: - Group verbal and written instruction and models provide basic cardiac anatomy and physiology, with the coronary electrical and arterial systems. Review of: AMI, Angina, Valve disease, Heart Failure, Cardiac Arrhythmia, Pacemakers, and the ICD.   Cardiac Procedures: - Group verbal and written instruction and models to describe the testing methods done to diagnose heart disease. Reviews the outcomes of the test results. Describes the treatment choices: Medical Management, Angioplasty, or Coronary Bypass Surgery.   Cardiac Medications: - Group verbal and written instruction to review commonly prescribed medications for heart disease. Reviews the medication, class of the drug, and side effects. Includes the steps to properly store meds and maintain the prescription regimen.   Go Sex-Intimacy & Heart Disease, Get SMART - Goal Setting: - Group verbal and written instruction through game format to  discuss heart disease and the return to sexual intimacy. Provides group verbal and written material to discuss and apply goal setting through the application of the S.M.A.R.T. Method.   Other Matters of the Heart: - Provides group verbal, written materials and models to describe Heart Failure, Angina, Valve Disease, and Diabetes in the realm of heart disease. Includes description of the disease process and treatment options available to the cardiac  patient.   Exercise & Equipment Safety: - Individual verbal instruction and demonstration of equipment use and safety with use of the equipment.      Cardiac Rehab from 08/10/2015 in [facility] Cardiac Rehab   Date  07/28/15   Educator  DW   Instruction Review Code  1- partially meets, needs review/practice      Infection Prevention: - Provides verbal and written material to individual with discussion of infection control including proper hand washing and proper equipment cleaning during exercise session.      Cardiac Rehab from 08/10/2015 in [facility] Cardiac Rehab   Date  07/28/15   Educator  DW   Instruction Review Code  2- meets goals/outcomes      Falls Prevention: - Provides verbal and written material to individual with discussion of falls prevention and safety.      Cardiac Rehab from 08/10/2015 in [facility] Cardiac Rehab   Date  07/28/15   Educator  DW   Instruction Review Code  1- partially meets, needs review/practice      Diabetes: - Individual verbal and written instruction to review signs/symptoms of diabetes, desired ranges of glucose level fasting, after meals and with exercise. Advice that pre and post exercise glucose checks will be done for 3 sessions at entry of program.      Cardiac Rehab from 08/10/2015 in [facility] Cardiac Rehab   Date  07/28/15   Educator  DW   Instruction Review Code  2- meets goals/outcomes       Knowledge Questionnaire Score:     Knowledge Questionnaire Score - 08/04/15 1733    Knowledge Questionnaire  Score   Pre Score 24      Personal Goals and Risk Factors at Admission:     Personal Goals and Risk Factors at Admission - 07/28/15 1829    Personal Goals and Risk Factors on Admission    Weight Management Yes   Intervention Learn and follow the exercise and diet guidelines while in the program. Utilize the nutrition and education classes to help gain knowledge of the diet and exercise expectations in the program   Admit Weight 247 lb 1.6 oz (112.084 kg)   Goal Weight 195 lb (88.451 kg)   Increase Aerobic Exercise and Physical Activity Yes   Intervention While in program, learn and follow the exercise prescription taught. Start at a low level workload and increase workload after able to maintain previous level for 30 minutes. Increase time before increasing intensity.   Take Less Medication Yes   Intervention Learn your risk factors and begin the lifestyle modifications for risk factor control during your time in the program.   Understand more about Heart/Pulmonary Disease. Yes   Intervention While in program utilize professionals for any questions, and attend the education sessions. Great websites to use are www.americanheart.org or www.lung.org for reliable information.   Diabetes Yes   Goal Blood glucose control identified by blood glucose values, HgbA1C. Participant verbalizes understanding of the signs/symptoms of hyper/hypo glycemia, proper foot care and importance of medication and nutrition plan for blood glucose control.   Intervention Provide nutrition & aerobic exercise along with prescribed medications to achieve blood glucose in normal ranges: Fasting 65-99 mg/dL   Hypertension Yes   Goal Participant will see blood pressure controlled within the values of 140/74m/Hg or within value directed by their physician.   Intervention Provide nutrition & aerobic exercise along with prescribed medications to achieve BP 140/90 or less.   Lipids Yes   Goal Cholesterol controlled  with  medications as prescribed, with individualized exercise RX and with personalized nutrition plan. Value goals: LDL < 59m, HDL > 429m Participant states understanding of desired cholesterol values and following prescriptions.   Intervention Provide nutrition & aerobic exercise along with prescribed medications to achieve LDL <7064mHDL >38m61m Stress No   Personal Goal Other Yes   Personal Goal Improve balance   Intervention Other  Throughout program increase endurance, overall conditioning, strength and flexibility.        Personal Goals and Risk Factors Review:      Goals and Risk Factor Review      08/08/15 1752           Increase Aerobic Exercise and Physical Activity   Goals Progress/Improvement seen  Yes       Comments Was able to increase duration of exercise to 33 min on the NS. Patient stated that his legs felt stronger and he is starting to feel  better.           Personal Goals Discharge (Final Personal Goals and Risk Factors Review):      Goals and Risk Factor Review - 08/08/15 1752    Increase Aerobic Exercise and Physical Activity   Goals Progress/Improvement seen  Yes   Comments Was able to increase duration of exercise to 33 min on the NS. Patient stated that his legs felt stronger and he is starting to feel  better.        Comments: Britt's (EarHedy Camararength is better today.

## 2015-08-18 NOTE — Progress Notes (Signed)
Daily Session Note  Patient Details  Name: Steve Phillips MRN: [id number] Date of Birth: January 21, 1946 Referring Provider:  [name], MD  Encounter Date: 08/18/2015  Check In:     Session Check In - 08/18/15 1702    Check-In   Staff Present Gerlene Burdock RN, BSN;Diane Joya Gaskins RN, BSN   ER physicians immediately available to respond to emergencies See telemetry face sheet for immediately available ER MD   Medication changes reported     No   Fall or balance concerns reported    No   Warm-up and Cool-down Performed on first and last piece of equipment   VAD Patient? No   Pain Assessment   Currently in Pain? No/denies         Goals Met:  Proper associated with RPD/PD & O2 Sat Exercise tolerated well No report of cardiac concerns or symptoms  Goals Unmet:  Not Applicable  Goals Comments: Strength is better today.    Dr. Emily Filbert is Medical Director for [place] and [facility].

## 2015-08-19 ENCOUNTER — Ambulatory Visit: Payer: Medicare Other | Admitting: Internal Medicine

## 2015-08-19 ENCOUNTER — Encounter: Payer: Self-pay | Admitting: Internal Medicine

## 2015-08-19 ENCOUNTER — Ambulatory Visit (INDEPENDENT_AMBULATORY_CARE_PROVIDER_SITE_OTHER): Payer: Medicare Other | Admitting: Internal Medicine

## 2015-08-19 VITALS — BP 179/72 | HR 68 | Temp 98.2°F | Wt 246.2 lb

## 2015-08-19 DIAGNOSIS — G4700 Insomnia, unspecified: Secondary | ICD-10-CM | POA: Diagnosis not present

## 2015-08-19 DIAGNOSIS — E1149 Type 2 diabetes mellitus with other diabetic neurological complication: Secondary | ICD-10-CM

## 2015-08-19 DIAGNOSIS — I10 Essential (primary) hypertension: Secondary | ICD-10-CM | POA: Diagnosis not present

## 2015-08-19 DIAGNOSIS — E1140 Type 2 diabetes mellitus with diabetic neuropathy, unspecified: Secondary | ICD-10-CM

## 2015-08-19 DIAGNOSIS — I200 Unstable angina: Secondary | ICD-10-CM | POA: Diagnosis not present

## 2015-08-19 MED ORDER — CLOPIDOGREL BISULFATE 75 MG PO TABS: 75.0000 mg | ORAL_TABLET | Freq: Every day | ORAL | Status: DC

## 2015-08-19 MED ORDER — GABAPENTIN 300 MG PO CAPS: 300.0000 mg | ORAL_CAPSULE | Freq: Two times a day (BID) | ORAL | Status: AC

## 2015-08-19 MED ORDER — DULOXETINE HCL 60 MG PO CPEP: 60.0000 mg | ORAL_CAPSULE | Freq: Every day | ORAL | Status: DC

## 2015-08-19 MED ORDER — CARVEDILOL 25 MG PO TABS: 25.0000 mg | ORAL_TABLET | Freq: Two times a day (BID) | ORAL | Status: AC

## 2015-08-19 MED ORDER — LISINOPRIL-HYDROCHLOROTHIAZIDE 20-12.5 MG PO TABS: 1.0000 | ORAL_TABLET | Freq: Two times a day (BID) | ORAL | Status: AC

## 2015-08-19 MED ORDER — CLONAZEPAM 0.5 MG PO TABS: 0.5000 mg | ORAL_TABLET | Freq: Every day | ORAL | Status: DC

## 2015-08-19 NOTE — Assessment & Plan Note (Signed)
BG recently elevated. Increased Lantus to 55units daily. Will continue to monitor. Recheck A1c 10/2015.

## 2015-08-19 NOTE — Patient Instructions (Addendum)
Start Clonazepam 0.5mg  at bedtime to help with sleep.  Follow up in 4 weeks.

## 2015-08-19 NOTE — Assessment & Plan Note (Signed)
BP Readings from Last 3 Encounters:  08/19/15 179/72  08/15/15 153/76  08/11/15 148/82   BP elevated today, however has been very well controlled at rehab. Will monitor for now. Continue current medications.

## 2015-08-19 NOTE — Addendum Note (Signed)
Addended by: Vernetta Honey on: 08/19/2015 11:25 AM   Modules accepted: Orders

## 2015-08-19 NOTE — Assessment & Plan Note (Signed)
No improvement with Sonata or Ambien. Will change to Clonazepam as he appears to have some anxiety contributing to insomnia. Discussed risks of these medications, including increased risk of falls, memory loss. Follow up in 4 weeks and prn.

## 2015-08-19 NOTE — Progress Notes (Signed)
Subjective:    Patient ID: Steve Phillips, male    DOB: June 05, 1946, 69 y.o.   MRN: [id number]  HPI  69YO male presents for follow up.  Last seen 9/22 for insomnia. Started on Ambien.  Insomnia - No improvement at all with Ambien. Continues to be awake all night. No more than 2hr sleep. Not sleeping during day. Continues to be active during day. Stopped taking medication.  HTN - BP has been well controlled. Mostly 801K systolic at cardiac rehab.  DM - BG have been up and down. Increased Lantus to 55units bid. Compliant with medication. Some dietary indiscretion noted.  Wt Readings from Last 3 Encounters:  08/19/15 246 lb 4 oz (111.698 kg)  08/15/15 243 lb (110.224 kg)  08/11/15 248 lb 6 oz (112.662 kg)   BP Readings from Last 3 Encounters:  08/19/15 179/72  08/15/15 153/76  08/11/15 148/82    Past Medical History  Diagnosis Date  . Shingles   . Hyperlipidemia   . Lower leg pain     chronic ,left leg  . Acute angina   . Depression   . CAD (coronary artery disease)     s/p multiple PCI with stent RCA,LAD and obtuse marginal,followed @ [facility] on the accord study.., cath 02/2012 90% D1, s/p drug-eluting stent Dr. Fletcher [name]  . Shortness of breath   . Hypertension     dr Jerilynn Mages Audelia [place]on     [facility] in [place]  . Bell's palsy   . Left leg pain   . 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 Type II diabetes mellitus   . GERD (gastroesophageal reflux disease)   . Headache(784.0)     "sometimes daily; sometimes weekly" (05/16/2015)  . Stroke 2015    denies residual on 05/16/2015  . Chronic lower back pain     "for 3 months now" (05/16/2015)  . Diabetic peripheral neuropathy     "feet"  . Falls frequently     "in the last month or so" (05/16/2015)  . Vascular parkinsonism dx'd 10/2014   Family History  Problem Relation Age of Onset  . Cancer Mother     BREAST  . Heart disease Mother     STENT  . Cancer Father     THROAT  . Heart disease Sister     STENT; HTN  . Heart  attack Brother   . Hypertension Brother   . Hypertension Sister    Past Surgical History  Procedure Laterality Date  . Appendectomy    . Tibia fracture surgery Left ~ 2002  . Foot fracture surgery Left ~ 2002    "Jone's fracture"  . Testicle surgery Left 2000's    "put drainage tube in for infection"  . Back surgery    . Anterior cervical decomp/discectomy fusion N/A 04/30/2013    Procedure: ANTERIOR CERVICAL DECOMPRESSION/DISCECTOMY FUSION 2 LEVELS;  Surgeon: Faythe Ghee, MD;  Location: [facility];  Service: Neurosurgery;  Laterality: N/A;  Cervical four-five, Cervical six-seven anterior cervical decompression fusion with trabecular metal cage and plate  . Left heart catheterization with coronary angiogram N/A 02/23/2015    Procedure: LEFT HEART CATHETERIZATION WITH CORONARY ANGIOGRAM;  Surgeon: [name], MD;  Location: [facility];  Service: Cardiovascular;  Laterality: N/A;  . Cholecystectomy open  1980's  . Cardiac catheterization  02/2012  . Coronary angioplasty with stent placement  ~ 2000-02/2015    "he has a total of 6 stents" (05/16/2015  . Fracture  surgery    . Squamous cell carcinoma excision Left 2014    "arm"   Social History   Social History  . Marital Status: Married    Spouse Name: N/A  . Number of Children: N/A  . Years of Education: N/A   Social History Main Topics  . Smoking status: Former Smoker -- 0.12 packs/day for 10 years    Types: Cigarettes    Quit date: 11/08/1971  . Smokeless tobacco: Never Used  . Alcohol Use: No  . Drug Use: No  . Sexual Activity: Not Currently   Other Topics Concern  . 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  Eyes: Negative for visual disturbance.  Respiratory: Negative for cough and shortness of breath.   Cardiovascular: Negative for chest pain, palpitations and leg swelling.  Gastrointestinal: Negative for nausea, vomiting, abdominal pain, diarrhea, constipation and abdominal distention.  Genitourinary: Negative for dysuria, urgency and difficulty urinating.  Musculoskeletal: Positive for myalgias, arthralgias and gait problem.  Skin: Negative for color change and rash.  Neurological: Positive for weakness and numbness.  Hematological: Negative for adenopathy.  Psychiatric/Behavioral: Positive for sleep disturbance. Negative for dysphoric mood. The patient is not nervous/anxious.        Objective:    BP 179/72 mmHg  Pulse 68  Temp(Src) 98.2 F (36.8 C) (Oral)  Wt 246 lb 4 oz (111.698 kg)  SpO2 96% Physical Exam  Constitutional: He is oriented to person, place, and time. He appears well-developed and well-nourished. No distress.  HENT:  Head: Normocephalic and atraumatic.  Right Ear: External ear normal.  Left Ear: External ear normal.  Nose: Nose normal.  Mouth/Throat: Oropharynx is clear and moist.  Eyes: Conjunctivae and EOM are normal. Pupils are equal, round, and reactive to light. Right eye exhibits no discharge. Left eye exhibits no discharge. No scleral icterus.  Neck: Normal range of motion. Neck supple. No tracheal deviation present. No thyromegaly present.  Cardiovascular: Normal rate, regular rhythm and normal heart sounds.  Exam reveals no gallop and no friction rub.   No murmur heard. Pulmonary/Chest: Effort normal and breath sounds normal. No accessory muscle usage. No tachypnea. No respiratory distress. He has no decreased breath sounds. He has no wheezes. He has no rhonchi. He has no rales. He exhibits no tenderness.  Musculoskeletal: Normal range of motion. He exhibits no edema.  Lymphadenopathy:    He has no cervical adenopathy.  Neurological: He is alert  and oriented to person, place, and time. No cranial nerve deficit. Coordination abnormal.  Skin: Skin is warm and dry. No rash noted. He is not diaphoretic. No erythema. No pallor.  Psychiatric: He has a normal mood and affect. His behavior is normal. Judgment and thought content normal.          Assessment & Plan:   Problem List Items Addressed This Visit      High   DM (diabetes mellitus), type 2 with neurological complications (Chronic)    BG recently elevated. Increased Lantus to 55units daily. Will continue to monitor. Recheck A1c 10/2015.      Relevant Medications   metFORMIN (GLUCOPHAGE) 1000 MG tablet   Hypertension (Chronic)  BP Readings from Last 3 Encounters:  08/19/15 179/72  08/15/15 153/76  08/11/15 148/82   BP elevated today, however has been very well controlled at rehab. Will monitor for now. Continue current medications.        Unprioritized   Insomnia - Primary    No improvement with Sonata or Ambien. Will change to Clonazepam as he appears to have some anxiety contributing to insomnia. Discussed risks of these medications, including increased risk of falls, memory loss. Follow up in 4 weeks and prn.          Return in about 4 weeks (around 09/16/2015) for Recheck.

## 2015-08-19 NOTE — Progress Notes (Signed)
Pre visit review using our clinic review tool, if applicable. No additional management support is needed unless otherwise documented below in the visit note. 

## 2015-08-22 ENCOUNTER — Encounter: Payer: Medicare Other | Attending: Cardiovascular Disease

## 2015-08-22 DIAGNOSIS — Z9861 Coronary angioplasty status: Secondary | ICD-10-CM | POA: Insufficient documentation

## 2015-08-22 DIAGNOSIS — I2510 Atherosclerotic heart disease of native coronary artery without angina pectoris: Secondary | ICD-10-CM | POA: Diagnosis not present

## 2015-08-22 DIAGNOSIS — Z955 Presence of coronary angioplasty implant and graft: Secondary | ICD-10-CM | POA: Insufficient documentation

## 2015-08-22 NOTE — Progress Notes (Signed)
Daily Session Note  Patient Details  Name: ELVER STADLER MRN: [id number] Date of Birth: January 08, 1946 Referring Provider:  [name], MD  Encounter Date: 08/22/2015  Check In:     Session Check In - 08/22/15 1711    Check-In   Staff Present Lestine Box BS, ACSM EP-C, Exercise Physiologist;Carroll Enterkin RN, BSN;Mary Kellie Shropshire RN   ER physicians immediately available to respond to emergencies See telemetry face sheet for immediately available ER MD   Medication changes reported     No   Fall or balance concerns reported    No   Warm-up and Cool-down Performed on first and last piece of equipment   VAD Patient? No   Pain Assessment   Currently in Pain? No/denies         Goals Met:  Proper associated with RPD/PD & O2 Sat Exercise tolerated well No report of cardiac concerns or symptoms Strength training completed today  Goals Unmet:  Not Applicable  Goals Comments:    Dr. Emily Filbert is Medical Director for [facility] and [facility].

## 2015-08-23 DIAGNOSIS — M4716 Other spondylosis with myelopathy, lumbar region: Secondary | ICD-10-CM | POA: Diagnosis not present

## 2015-08-23 DIAGNOSIS — E1140 Type 2 diabetes mellitus with diabetic neuropathy, unspecified: Secondary | ICD-10-CM | POA: Diagnosis not present

## 2015-08-23 DIAGNOSIS — R262 Difficulty in walking, not elsewhere classified: Secondary | ICD-10-CM | POA: Diagnosis not present

## 2015-08-24 ENCOUNTER — Encounter: Payer: Self-pay | Admitting: *Deleted

## 2015-08-24 ENCOUNTER — Encounter: Payer: Medicare Other | Admitting: *Deleted

## 2015-08-24 ENCOUNTER — Encounter: Payer: Self-pay | Admitting: Internal Medicine

## 2015-08-24 DIAGNOSIS — I2510 Atherosclerotic heart disease of native coronary artery without angina pectoris: Secondary | ICD-10-CM | POA: Diagnosis not present

## 2015-08-24 DIAGNOSIS — Z955 Presence of coronary angioplasty implant and graft: Secondary | ICD-10-CM

## 2015-08-24 DIAGNOSIS — G4700 Insomnia, unspecified: Secondary | ICD-10-CM

## 2015-08-24 DIAGNOSIS — Z9861 Coronary angioplasty status: Secondary | ICD-10-CM | POA: Diagnosis not present

## 2015-08-24 NOTE — Progress Notes (Signed)
Cardiac Individual Treatment Plan  Patient Details  Name: Steve Phillips MRN: [id number] Date of Birth: Dec 12, 1945 Referring Provider:  No ref. provider found  Initial Encounter Date:    Visit Diagnosis: CAD S/P percutaneous coronary angioplasty  Patient's Home Medications on Admission:  Current outpatient prescriptions:  .  acetaminophen (TYLENOL) 325 MG tablet, Take 2 tablets (650 mg total) by mouth every 4 (four) hours as needed for headache or mild pain., Disp: , Rfl:  .  aspirin EC 81 MG tablet, Take 1 tablet (81 mg total) by mouth daily., Disp: , Rfl:  .  carbidopa-levodopa (SINEMET IR) 25-100 MG per tablet, Take 2 tablets by mouth 3 (three) times daily. , Disp: , Rfl:  .  carvedilol (COREG) 25 MG tablet, Take 1 tablet (25 mg total) by mouth 2 (two) times daily., Disp: 180 tablet, Rfl: 3 .  clonazePAM (KLONOPIN) 0.5 MG tablet, Take 1 tablet (0.5 mg total) by mouth at bedtime., Disp: 30 tablet, Rfl: 3 .  clopidogrel (PLAVIX) 75 MG tablet, Take 1 tablet (75 mg total) by mouth daily with breakfast., Disp: 90 tablet, Rfl: 1 .  cyanocobalamin (,VITAMIN B-12,) 1000 MCG/ML injection, Inject 1,000 mcg into the muscle every 30 (thirty) days. , Disp: , Rfl:  .  DULoxetine (CYMBALTA) 60 MG capsule, Take 1 capsule (60 mg total) by mouth daily. TAKE 1 CAPSULE DAILY, Disp: 90 capsule, Rfl: 1 .  gabapentin (NEURONTIN) 300 MG capsule, Take 1 capsule (300 mg total) by mouth 2 (two) times daily., Disp: 180 capsule, Rfl: 1 .  HYDROcodone-acetaminophen (NORCO/VICODIN) 5-325 MG per tablet, 1-2 po tid prn Earliest Fill Date: 08/04/15, Disp: , Rfl:  .  insulin glargine (LANTUS) 100 UNIT/ML injection, Inject 55 Units into the skin 2 (two) times daily. , Disp: , Rfl:  .  insulin regular (HUMULIN R) 100 units/mL injection, Inject 0.1-0.14 mLs (10-14 Units total) into the skin 3 (three) times daily before meals., Disp: 20 mL, Rfl: 1 .  lisinopril-hydrochlorothiazide (PRINZIDE,ZESTORETIC) 20-12.5 MG tablet, Take 1  tablet by mouth 2 (two) times daily., Disp: 180 tablet, Rfl: 1 .  metFORMIN (GLUCOPHAGE) 1000 MG tablet, Take 1,000 mg by mouth 2 (two) times daily with a meal., Disp: , Rfl:  .  nitroGLYCERIN (NITROSTAT) 0.4 MG SL tablet, Place 1 tablet (0.4 mg total) under the tongue every 5 (five) minutes as needed for chest pain., Disp: 30 tablet, Rfl: 6 .  pantoprazole (PROTONIX) 40 MG tablet, Take 1 tablet (40 mg total) by mouth 2 (two) times daily., Disp: 180 tablet, Rfl: 3 .  simvastatin (ZOCOR) 20 MG tablet, Take 1 tablet (20 mg total) by mouth at bedtime., Disp: 90 tablet, Rfl: 3  Past Medical History: Past Medical History  Diagnosis Date  . Shingles   . Hyperlipidemia   . Lower leg pain     chronic ,left leg  . Acute angina   . Depression   . CAD (coronary artery disease)     s/p multiple PCI with stent RCA,LAD and obtuse marginal,followed @ [facility] on the accord study.., cath 02/2012 90% D1, s/p drug-eluting stent Dr. Fletcher [name]  . Shortness of breath   . Hypertension     dr Jerilynn Mages Audelia Acton     [facility]auer in [place]  . Bell's palsy   . Left leg pain   . 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 Type II diabetes mellitus   . GERD (gastroesophageal reflux disease)   . Headache(784.0)     "sometimes daily;  sometimes weekly" (05/16/2015)  . Stroke 2015    denies residual on 05/16/2015  . Chronic lower back pain     "for 3 months now" (05/16/2015)  . Diabetic peripheral neuropathy     "feet"  . Falls frequently     "in the last month or Phillips" (05/16/2015)  . Vascular parkinsonism dx'd 10/2014    Tobacco Use: History  Smoking status  . Former Smoker -- 0.12 packs/day for 10 years  . Types: Cigarettes  . Quit date: 11/08/1971  Smokeless tobacco  . Never Used    Labs: Recent Review Flowsheet Data    Labs for ITP Cardiac and Pulmonary Rehab Latest Ref Rng 11/16/2014 02/16/2015 03/05/2015 05/06/2015 07/21/2015   Cholestrol - 189 - 94 - -   LDLCALC - - - 50 - -   LDLDIRECT - 116.0 - - - -   HDL -  38.90(L) - 30(L) - -   Trlycerides - 306.0(H) - 70 - -   Hemoglobin A1c 4.6 - 6.5 % 9.7(H) 9.1(H) 8.7(H) 9.1(H) 7.2(H)       Exercise Target Goals:    Exercise Program Goal: Individual exercise prescription set with THRR, safety & activity barriers. Participant demonstrates ability to understand and report RPE using BORG scale, to self-measure pulse accurately, and to acknowledge the importance of the exercise prescription.  Exercise Prescription Goal: Starting with aerobic activity 30 plus minutes a day, 3 days per week for initial exercise prescription. Provide home exercise prescription and guidelines that participant acknowledges understanding prior to discharge.  Activity Barriers & Risk Stratification:     Activity Barriers & Risk Stratification - 07/28/15 1826    Activity Barriers & Risk Stratification   Activity Barriers Back Problems;Balance Concerns;Assistive Device  Uses walker to ambulate   Risk Stratification High      6 Minute Walk:     6 Minute Walk      07/28/15 1519       6 Minute Walk   Phase Initial     Distance 355 feet     Walk Time 6 minutes     Resting HR 57 bpm     Resting BP 122/70 mmHg     Max Ex. HR 107 bpm     Max Ex. BP 146/70 mmHg     RPE 13     Symptoms No        Initial Exercise Prescription:     Initial Exercise Prescription - 07/28/15 1500    Date of Initial Exercise Prescription   Date 07/28/15   Treadmill   MPH 1   Grade 0   Minutes 10   Recumbant Bike   Level 2   RPM 40   Watts 20   Minutes 10   NuStep   Level 2   Watts 40   Minutes 10   Arm Ergometer   Level 1   Watts 10   Minutes 10   REL-XR   Level 2   Watts 40   Minutes 10   Prescription Details   Frequency (times per week) 3   Duration Progress to 30 minutes of continuous aerobic without signs/symptoms of physical distress   Intensity   THRR REST +  30   Ratings of Perceived Exertion 11-15   Perceived Dyspnea 2-4   Progression Continue  progressive overload as per policy without signs/symptoms or physical distress.   Resistance Training   Training Prescription Yes   Weight 2   Reps 10-15  Exercise Prescription Changes:     Exercise Prescription Changes      08/01/15 0600 08/17/15 1700         Exercise Review   Progression No  Has not started program yet Yes      Response to Exercise   Symptoms  None      Comments  Reviewed individualized exercise prescription and made increases per departmental policy. Exercise increases were discussed with the patient and they were able to perform the new work loads without issue (no signs or symptoms).       Duration  Progress to 30 minutes of continuous aerobic without signs/symptoms of physical distress      Intensity  Rest + 30      Progression  Continue progressive overload as per policy without signs/symptoms or physical distress.      Resistance Training   Training Prescription  Yes      Weight  4      Reps  10-15      Interval Training   Interval Training  No      NuStep   Level  4      Watts  35      Minutes  20         Discharge Exercise Prescription (Final Exercise Prescription Changes):     Exercise Prescription Changes - 08/17/15 1700    Exercise Review   Progression Yes   Response to Exercise   Symptoms None   Comments Reviewed individualized exercise prescription and made increases per departmental policy. Exercise increases were discussed with the patient and they were able to perform the new work loads without issue (no signs or symptoms).    Duration Progress to 30 minutes of continuous aerobic without signs/symptoms of physical distress   Intensity Rest + 30   Progression Continue progressive overload as per policy without signs/symptoms or physical distress.   Resistance Training   Training Prescription Yes   Weight 4   Reps 10-15   Interval Training   Interval Training No   NuStep   Level 4   Watts 35   Minutes 20       Nutrition:  Target Goals: Understanding of nutrition guidelines, daily intake of sodium <1542m, cholesterol <2056m calories 30% from fat and 7% or less from saturated fats, daily to have 5 or more servings of fruits and vegetables.  Biometrics:     Pre Biometrics - 07/28/15 1523    Pre Biometrics   Height 6' (1.829 m)   Weight 247 lb 1.6 oz (112.084 kg)   Waist Circumference 46 inches   Hip Circumference 50 inches   Waist to Hip Ratio 0.92 %   BMI (Calculated) 33.6       Nutrition Therapy Plan and Nutrition Goals:     Nutrition Therapy & Goals - 08/10/15 1802    Nutrition Therapy   Diet 1700-1800kcal Diabetes diet   Drug/Food Interactions Statins/Certain Fruits   Fiber 30 grams   Whole Grain Foods 3 servings   Protein 7 ounces/day   Saturated Fats 13 max. grams   Fruits and Vegetables 8 servings/day   Personal Nutrition Goals   Personal Goal #1 Keep working to control food portions, especially starchy foods (fist-size or less)   Personal Goal #2 Eat plenty of vegetables and fruits (small-medium portions of fruits to control blood sugar)      Nutrition Discharge: Rate Your Plate Scores:     Rate Your Plate - [id number]/74/1683845  Rate Your Plate Scores   Pre Score 62   Pre Score % 69 %      Nutrition Goals Re-Evaluation:     Nutrition Goals Re-Evaluation      08/24/15 1333           Personal Goal #1 Re-Evaluation   Personal Goal #1 Steve Phillips reports he is doing much better with his food portions!       Personal Goal #2 Re-Evaluation   Goal Progress Seen Yes          Psychosocial: Target Goals: Acknowledge presence or absence of depression, maximize coping skills, provide positive support system. Participant is able to verbalize types and ability to use techniques and skills needed for reducing stress and depression.  Initial Review & Psychosocial Screening:     Initial Psych Review & Screening - 07/28/15 1912    Initial Review   Current issues with  Current Sleep Concerns;Current Depression;Current Psychotropic Meds      Quality of Life Scores:     Quality of Life - 08/04/15 1734    Quality of Life Scores   Health/Function Pre 3.6 %   Socioeconomic Pre 20 %   Psych/Spiritual Pre 18 %   Family Pre 22.8 %   GLOBAL Pre 12.55 %      PHQ-9:     Recent Review Flowsheet Data    Depression screen [facility] 2/9 07/28/2015 03/11/2015 12/26/2012   Decreased Interest 1 0 0   Down, Depressed, Hopeless 1 0 0   PHQ - 2 Score 2 0 0   Altered sleeping 2 - -   Tired, decreased energy 3 - -   Change in appetite 1 - -   Feeling bad or failure about yourself  0 - -   Trouble concentrating 2 - -   Moving slowly or fidgety/restless 0 - -   Suicidal thoughts 0 - -   PHQ-9 Score 10 - -   Difficult doing work/chores Not difficult at all - -      Psychosocial Evaluation and Intervention:     Psychosocial Evaluation - 08/03/15 1720    Psychosocial Evaluation & Interventions   Interventions Stress management education;Relaxation education;Encouraged to exercise with the program and follow exercise prescription   Comments Counselor met with Steve Phillips today for initial psychosocial evaluation.  He is a 69 year old who had 6 stents inserted over the summer and has spent several months in rehabiliation for nerve damage in his leg as a result.  He has a strong support system with a spouse of 66 years and several adult children living close by as well as active involvement in his local church community.  He reports difficulty sleeping since he was taken off a sleep aid by his Dr. and his appetite has decreased since the surgery.  He reports a history of depression and anxiety and has been addressing this through medication and talk therapy.  He states his current mood is positive and he is optimistic about this program.  His goals are to improve his balance, increase his energy and stamina and lose some more weight.  Counselor encouraged him to check with his Dr.  or pharmacist re: an OTC sleep aid since the Melatonin he is using is not working.  Counselor also taught deep breathing and muscle relaxation techniques and will follow with him as needed.   Continued Psychosocial Services Needed --  Steve Phillips will benefit from the psychoeducational components of this program considering his history of  depression and anxiety.  He also will benefit from consistent exercise and learning relaxation techniques.       Psychosocial Re-Evaluation:     Psychosocial Re-Evaluation      08/24/15 1332           Psychosocial Re-Evaluation   Interventions Encouraged to attend Cardiac Rehabilitation for the exercise       Comments --  Steve Phillips was happy that he was able to attend a high school football game with his walker but still able to go. with his wife.           Vocational Rehabilitation: Provide vocational rehab assistance to qualifying candidates.   Vocational Rehab Evaluation & Intervention:     Vocational Rehab - 07/28/15 1827    Initial Vocational Rehab Evaluation & Intervention   Assessment shows need for Vocational Rehabilitation No  Patient is retired      Education: Education Goals: Education classes will be provided on a weekly basis, covering required topics. Participant will state understanding/return demonstration of topics presented.  Learning Barriers/Preferences:     Learning Barriers/Preferences - 07/28/15 1827    Learning Barriers/Preferences   Learning Barriers Hearing   Learning Preferences Group Instruction      Education Topics: General Nutrition Guidelines/Fats and Fiber: -Group instruction provided by verbal, written material, models and posters to present the general guidelines for heart healthy nutrition. Gives an explanation and review of dietary fats and fiber.   Controlling Sodium/Reading Food Labels: -Group verbal and written material supporting the discussion of sodium use in heart healthy nutrition. Review  and explanation with models, verbal and written materials for utilization of the food label.   Exercise Physiology & Risk Factors: - Group verbal and written instruction with models to review the exercise physiology of the cardiovascular system and associated critical values. Details cardiovascular disease risk factors and the goals associated with each risk factor.   Aerobic Exercise & Resistance Training: - Gives group verbal and written discussion on the health impact of inactivity. On the components of aerobic and resistive training programs and the benefits of this training and how to safely progress through these programs.          Cardiac Rehab from 08/22/2015 in [facility] Cardiac Rehab   Date  08/01/15   Educator  SW   Instruction Review Code  2- meets goals/outcomes      Flexibility, Balance, General Exercise Guidelines: - Provides group verbal and written instruction on the benefits of flexibility and balance training programs. Provides general exercise guidelines with specific guidelines to those with heart or lung disease. Demonstration and skill practice provided.      Cardiac Rehab from 08/22/2015 in [facility] Cardiac Rehab   Date  08/08/15   Educator  SW   Instruction Review Code  2- meets goals/outcomes      Stress Management: - Provides group verbal and written instruction about the health risks of elevated stress, cause of high stress, and healthy ways to reduce stress.      Cardiac Rehab from 08/22/2015 in [facility] Cardiac Rehab   Date  08/10/15   Educator  Kathreen Cornfield   Instruction Review Code  2- meets goals/outcomes      Depression: - Provides group verbal and written instruction on the correlation between heart/lung disease and depressed mood, treatment options, and the stigmas associated with seeking treatment.   Anatomy & Physiology of the Heart: - Group verbal and written instruction and models provide basic cardiac anatomy and physiology, with  the coronary electrical  and arterial systems. Review of: AMI, Angina, Valve disease, Heart Failure, Cardiac Arrhythmia, Pacemakers, and the ICD.   Cardiac Procedures: - Group verbal and written instruction and models to describe the testing methods done to diagnose heart disease. Reviews the outcomes of the test results. Describes the treatment choices: Medical Management, Angioplasty, or Coronary Bypass Surgery.   Cardiac Medications: - Group verbal and written instruction to review commonly prescribed medications for heart disease. Reviews the medication, class of the drug, and side effects. Includes the steps to properly store meds and maintain the prescription regimen.   Go Sex-Intimacy & Heart Disease, Get SMART - Goal Setting: - Group verbal and written instruction through game format to discuss heart disease and the return to sexual intimacy. Provides group verbal and written material to discuss and apply goal setting through the application of the S.M.A.R.T. Method.   Other Matters of the Heart: - Provides group verbal, written materials and models to describe Heart Failure, Angina, Valve Disease, and Diabetes in the realm of heart disease. Includes description of the disease process and treatment options available to the cardiac patient.   Exercise & Equipment Safety: - Individual verbal instruction and demonstration of equipment use and safety with use of the equipment.      Cardiac Rehab from 08/22/2015 in [facility] Cardiac Rehab   Date  07/28/15   Educator  DW   Instruction Review Code  1- partially meets, needs review/practice      Infection Prevention: - Provides verbal and written material to individual with discussion of infection control including proper hand washing and proper equipment cleaning during exercise session.      Cardiac Rehab from 08/22/2015 in [facility] Cardiac Rehab   Date  07/28/15   Educator  DW   Instruction Review Code  2- meets goals/outcomes      Falls Prevention: - Provides  verbal and written material to individual with discussion of falls prevention and safety.      Cardiac Rehab from 08/22/2015 in [facility] Cardiac Rehab   Date  07/28/15   Educator  DW   Instruction Review Code  1- partially meets, needs review/practice      Diabetes: - Individual verbal and written instruction to review signs/symptoms of diabetes, desired ranges of glucose level fasting, after meals and with exercise. Advice that pre and post exercise glucose checks will be done for 3 sessions at entry of program.      Cardiac Rehab from 08/22/2015 in [facility] Cardiac Rehab   Date  07/28/15   Educator  DW   Instruction Review Code  2- meets goals/outcomes       Knowledge Questionnaire Score:     Knowledge Questionnaire Score - 08/04/15 1733    Knowledge Questionnaire Score   Pre Score 24      Personal Goals and Risk Factors at Admission:     Personal Goals and Risk Factors at Admission - 07/28/15 1829    Personal Goals and Risk Factors on Admission    Weight Management Yes   Intervention Learn and follow the exercise and diet guidelines while in the program. Utilize the nutrition and education classes to help gain knowledge of the diet and exercise expectations in the program   Admit Weight 247 lb 1.6 oz (112.084 kg)   Goal Weight 195 lb (88.451 kg)   Increase Aerobic Exercise and Physical Activity Yes   Intervention While in program, learn and follow the exercise prescription taught. Start at a low level  workload and increase workload after able to maintain previous level for 30 minutes. Increase time before increasing intensity.   Take Less Medication Yes   Intervention Learn your risk factors and begin the lifestyle modifications for risk factor control during your time in the program.   Understand more about Heart/Pulmonary Disease. Yes   Intervention While in program utilize professionals for any questions, and attend the education sessions. Great websites to use are  www.americanheart.org or www.lung.org for reliable information.   Diabetes Yes   Goal Blood glucose control identified by blood glucose values, HgbA1C. Participant verbalizes understanding of the signs/symptoms of hyper/hypo glycemia, proper foot care and importance of medication and nutrition plan for blood glucose control.   Intervention Provide nutrition & aerobic exercise along with prescribed medications to achieve blood glucose in normal ranges: Fasting 65-99 mg/dL   Hypertension Yes   Goal Participant will see blood pressure controlled within the values of 140/86m/Hg or within value directed by their physician.   Intervention Provide nutrition & aerobic exercise along with prescribed medications to achieve BP 140/90 or less.   Lipids Yes   Goal Cholesterol controlled with medications as prescribed, with individualized exercise RX and with personalized nutrition plan. Value goals: LDL < 738m HDL > 4073mParticipant states understanding of desired cholesterol values and following prescriptions.   Intervention Provide nutrition & aerobic exercise along with prescribed medications to achieve LDL <75m98mDL >40mg21mStress No   Personal Goal Other Yes   Personal Goal Improve balance   Intervention Other  Throughout program increase endurance, overall conditioning, strength and flexibility.        Personal Goals and Risk Factors Review:      Goals and Risk Factor Review      08/08/15 1752 08/24/15 1330         Increase Aerobic Exercise and Physical Activity   Goals Progress/Improvement seen  Yes Yes      Comments Was able to increase duration of exercise to 33 min on the NS. Patient stated that his legs felt stronger and he is starting to feel  better.  Steve Phillips he is able to do things that he hasn't been able to do lately like go to a high school football game.       Diabetes   Goal  --  Blood sugars are more stable. He is eating smaller portions of food.       Hypertension    Goal  --  Steve Phillips's blood pressure has been well controlled-see LSI sheets.      Abnormal Lipids   Goal  --  Steve Kingdominues to take his cholestrol medicine.          Personal Goals Discharge (Final Personal Goals and Risk Factors Review):      Goals and Risk Factor Review - 08/24/15 1330    Increase Aerobic Exercise and Physical Activity   Goals Progress/Improvement seen  Yes   Comments Steve Phillips he is able to do things that he hasn't been able to do lately like go to a high school football game.    Diabetes   Goal --  Blood sugars are more stable. He is eating smaller portions of food.    Hypertension   Goal --  Steve Phillips's blood pressure has been well controlled-see LSI sheets.   Abnormal Lipids   Goal --  Steve Kelcyinues to take his cholestrol medicine.        Comments: Steve Phillips "things are going good" and  he is glad he started Cardiac REhab.

## 2015-08-24 NOTE — Progress Notes (Signed)
Daily Session Note  Patient Details  Name: Steve Phillips MRN: [id number] Date of Birth: November 28, 1945 Referring Provider:  [name], MD  Encounter Date: 08/24/2015  Check In:     Session Check In - 08/24/15 [facility]    Check-In   Staff Present Diane Joya Gaskins RN, BSN;Renee Dillard Essex MS, ACSM CEP Exercise Physiologist;Carroll Enterkin RN, BSN   ER physicians immediately available to respond to emergencies See telemetry face sheet for immediately available ER MD   Medication changes reported     No   Fall or balance concerns reported    No   Warm-up and Cool-down Performed on first and last piece of equipment   VAD Patient? No   Pain Assessment   Currently in Pain? No/denies         Goals Met:  Exercise tolerated well No report of cardiac concerns or symptoms Strength training completed today  Goals Unmet:  Not Applicable  Goals Comments: Blood pressure elevated on arrival at 180/90.  Rechecked again before exercise and was once again 180/90.  BP at end of class 160/80.  Instructed patient to continue checking his BP at home.     Dr. Emily Filbert is Medical Director for [place] and [facility].

## 2015-08-29 ENCOUNTER — Encounter: Payer: Self-pay | Admitting: *Deleted

## 2015-08-29 DIAGNOSIS — I2510 Atherosclerotic heart disease of native coronary artery without angina pectoris: Secondary | ICD-10-CM

## 2015-08-29 DIAGNOSIS — Z955 Presence of coronary angioplasty implant and graft: Secondary | ICD-10-CM | POA: Diagnosis not present

## 2015-08-29 DIAGNOSIS — Z9861 Coronary angioplasty status: Principal | ICD-10-CM

## 2015-08-29 NOTE — Progress Notes (Signed)
Daily Session Note  Patient Details  Name: Steve Phillips MRN: [id number] Date of Birth: August 28, 1946 Referring Provider:  [name], MD  Encounter Date: 08/29/2015  Check In:     Session Check In - 08/29/15 1637    Check-In   Staff Present Lestine Box BS, ACSM EP-C, Exercise Physiologist;Carroll Enterkin RN, BSN;Other   ER physicians immediately available to respond to emergencies See telemetry face sheet for immediately available ER MD   Medication changes reported     No   Fall or balance concerns reported    No   Warm-up and Cool-down Performed on first and last piece of equipment   VAD Patient? No   Pain Assessment   Currently in Pain? No/denies           Exercise Prescription Changes - 08/29/15 0600    Exercise Review   Progression Yes   Response to Exercise   Blood Pressure (Admit) 180/90 mmHg   Blood Pressure (Exercise) 184/90 mmHg   Blood Pressure (Exit) 160/80 mmHg   Heart Rate (Admit) 90 bpm   Heart Rate (Exercise) 91 bpm   Heart Rate (Exit) 85 bpm   Rating of Perceived Exertion (Exercise) 12   Symptoms High BP the last few visits. Informed his wife who said they will consult MD.   Comments [name] is making increases on hjs own which is great. He is responding well to the exercise and progressing steadily on all of the equipment.   Duration Progress to 30 minutes of continuous aerobic without signs/symptoms of physical distress   Intensity Rest + 30   Progression Continue progressive overload as per policy without signs/symptoms or physical distress.   Resistance Training   Training Prescription Yes   Weight 4   Reps 10-15   Interval Training   Interval Training No   NuStep   Level 5   Watts 35   Minutes 25   Recumbant Elliptical   Level 2   RPM 35   Watts 15   Minutes 15      Goals Met:  Independence with exercise equipment Exercise tolerated well No report of cardiac concerns or symptoms Strength training completed today  Goals Unmet:   Not Applicable  Goals Comments:    Dr. Emily Filbert is Medical Director for [facility].

## 2015-08-31 ENCOUNTER — Encounter: Payer: Self-pay | Admitting: *Deleted

## 2015-08-31 DIAGNOSIS — Z9861 Coronary angioplasty status: Principal | ICD-10-CM

## 2015-08-31 DIAGNOSIS — I2510 Atherosclerotic heart disease of native coronary artery without angina pectoris: Secondary | ICD-10-CM

## 2015-08-31 NOTE — Progress Notes (Signed)
Cardiac Individual Treatment Plan  Patient Details  Name: Steve Phillips MRN: [id number] Date of Birth: Dec 12, 1945 Referring Provider:  No ref. provider found  Initial Encounter Date:    Visit Diagnosis: CAD S/P percutaneous coronary angioplasty  Patient's Home Medications on Admission:  Current outpatient prescriptions:  .  acetaminophen (TYLENOL) 325 MG tablet, Take 2 tablets (650 mg total) by mouth every 4 (four) hours as needed for headache or mild pain., Disp: , Rfl:  .  aspirin EC 81 MG tablet, Take 1 tablet (81 mg total) by mouth daily., Disp: , Rfl:  .  carbidopa-levodopa (SINEMET IR) 25-100 MG per tablet, Take 2 tablets by mouth 3 (three) times daily. , Disp: , Rfl:  .  carvedilol (COREG) 25 MG tablet, Take 1 tablet (25 mg total) by mouth 2 (two) times daily., Disp: 180 tablet, Rfl: 3 .  clonazePAM (KLONOPIN) 0.5 MG tablet, Take 1 tablet (0.5 mg total) by mouth at bedtime., Disp: 30 tablet, Rfl: 3 .  clopidogrel (PLAVIX) 75 MG tablet, Take 1 tablet (75 mg total) by mouth daily with breakfast., Disp: 90 tablet, Rfl: 1 .  cyanocobalamin (,VITAMIN B-12,) 1000 MCG/ML injection, Inject 1,000 mcg into the muscle every 30 (thirty) days. , Disp: , Rfl:  .  DULoxetine (CYMBALTA) 60 MG capsule, Take 1 capsule (60 mg total) by mouth daily. TAKE 1 CAPSULE DAILY, Disp: 90 capsule, Rfl: 1 .  gabapentin (NEURONTIN) 300 MG capsule, Take 1 capsule (300 mg total) by mouth 2 (two) times daily., Disp: 180 capsule, Rfl: 1 .  HYDROcodone-acetaminophen (NORCO/VICODIN) 5-325 MG per tablet, 1-2 po tid prn Earliest Fill Date: 08/04/15, Disp: , Rfl:  .  insulin glargine (LANTUS) 100 UNIT/ML injection, Inject 55 Units into the skin 2 (two) times daily. , Disp: , Rfl:  .  insulin regular (HUMULIN R) 100 units/mL injection, Inject 0.1-0.14 mLs (10-14 Units total) into the skin 3 (three) times daily before meals., Disp: 20 mL, Rfl: 1 .  lisinopril-hydrochlorothiazide (PRINZIDE,ZESTORETIC) 20-12.5 MG tablet, Take 1  tablet by mouth 2 (two) times daily., Disp: 180 tablet, Rfl: 1 .  metFORMIN (GLUCOPHAGE) 1000 MG tablet, Take 1,000 mg by mouth 2 (two) times daily with a meal., Disp: , Rfl:  .  nitroGLYCERIN (NITROSTAT) 0.4 MG SL tablet, Place 1 tablet (0.4 mg total) under the tongue every 5 (five) minutes as needed for chest pain., Disp: 30 tablet, Rfl: 6 .  pantoprazole (PROTONIX) 40 MG tablet, Take 1 tablet (40 mg total) by mouth 2 (two) times daily., Disp: 180 tablet, Rfl: 3 .  simvastatin (ZOCOR) 20 MG tablet, Take 1 tablet (20 mg total) by mouth at bedtime., Disp: 90 tablet, Rfl: 3  Past Medical History: Past Medical History  Diagnosis Date  . Shingles   . Hyperlipidemia   . Lower leg pain     chronic ,left leg  . Acute angina   . Depression   . CAD (coronary artery disease)     s/p multiple PCI with stent RCA,LAD and obtuse marginal,followed @ [facility] on the accord study.., cath 02/2012 90% D1, s/p drug-eluting stent Dr. Fletcher [name]  . Shortness of breath   . Hypertension     dr Jerilynn Mages Audelia [place]on     [facility]auer in [place]  . Bell's palsy   . Left leg pain   . 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 Type II diabetes mellitus   . GERD (gastroesophageal reflux disease)   . Headache(784.0)     "sometimes daily;  sometimes weekly" (05/16/2015)  . Stroke 2015    denies residual on 05/16/2015  . Chronic lower back pain     "for 3 months now" (05/16/2015)  . Diabetic peripheral neuropathy     "feet"  . Falls frequently     "in the last month or so" (05/16/2015)  . Vascular parkinsonism dx'd 10/2014    Tobacco Use: History  Smoking status  . Former Smoker -- 0.12 packs/day for 10 years  . Types: Cigarettes  . Quit date: 11/08/1971  Smokeless tobacco  . Never Used    Labs: Recent Review Flowsheet Data    Labs for ITP Cardiac and Pulmonary Rehab Latest Ref Rng 11/16/2014 02/16/2015 03/05/2015 05/06/2015 07/21/2015   Cholestrol - 189 - 94 - -   LDLCALC - - - 50 - -   LDLDIRECT - 116.0 - - - -   HDL -  38.90(L) - 30(L) - -   Trlycerides - 306.0(H) - 70 - -   Hemoglobin A1c 4.6 - 6.5 % 9.7(H) 9.1(H) 8.7(H) 9.1(H) 7.2(H)       Exercise Target Goals:    Exercise Program Goal: Individual exercise prescription set with THRR, safety & activity barriers. Participant demonstrates ability to understand and report RPE using BORG scale, to self-measure pulse accurately, and to acknowledge the importance of the exercise prescription.  Exercise Prescription Goal: Starting with aerobic activity 30 plus minutes a day, 3 days per week for initial exercise prescription. Provide home exercise prescription and guidelines that participant acknowledges understanding prior to discharge.  Activity Barriers & Risk Stratification:     Activity Barriers & Risk Stratification - 07/28/15 1826    Activity Barriers & Risk Stratification   Activity Barriers Back Problems;Balance Concerns;Assistive Device  Uses walker to ambulate   Risk Stratification High      6 Minute Walk:     6 Minute Walk      07/28/15 1519       6 Minute Walk   Phase Initial     Distance 355 feet     Walk Time 6 minutes     Resting HR 57 bpm     Resting BP 122/70 mmHg     Max Ex. HR 107 bpm     Max Ex. BP 146/70 mmHg     RPE 13     Symptoms No        Initial Exercise Prescription:     Initial Exercise Prescription - 07/28/15 1500    Date of Initial Exercise Prescription   Date 07/28/15   Treadmill   MPH 1   Grade 0   Minutes 10   Recumbant Bike   Level 2   RPM 40   Watts 20   Minutes 10   NuStep   Level 2   Watts 40   Minutes 10   Arm Ergometer   Level 1   Watts 10   Minutes 10   REL-XR   Level 2   Watts 40   Minutes 10   Prescription Details   Frequency (times per week) 3   Duration Progress to 30 minutes of continuous aerobic without signs/symptoms of physical distress   Intensity   THRR REST +  30   Ratings of Perceived Exertion 11-15   Perceived Dyspnea 2-4   Progression Continue  progressive overload as per policy without signs/symptoms or physical distress.   Resistance Training   Training Prescription Yes   Weight 2   Reps 10-15  Exercise Prescription Changes:     Exercise Prescription Changes      08/01/15 0600 08/17/15 1700 08/29/15 0600       Exercise Review   Progression No  Has not started program yet Yes Yes     Response to Exercise   Blood Pressure (Admit)   180/90 mmHg     Blood Pressure (Exercise)   184/90 mmHg     Blood Pressure (Exit)   160/80 mmHg     Heart Rate (Admit)   90 bpm     Heart Rate (Exercise)   91 bpm     Heart Rate (Exit)   85 bpm     Rating of Perceived Exertion (Exercise)   12     Symptoms  None High BP the last few visits. Informed his wife who said they will consult MD.     Comments  Reviewed individualized exercise prescription and made increases per departmental policy. Exercise increases were discussed with the patient and they were able to perform the new work loads without issue (no signs or symptoms).  [name] is making increases on hjs own which is great. He is responding well to the exercise and progressing steadily on all of the equipment.     Duration  Progress to 30 minutes of continuous aerobic without signs/symptoms of physical distress Progress to 30 minutes of continuous aerobic without signs/symptoms of physical distress     Intensity  Rest + 30 Rest + 30     Progression  Continue progressive overload as per policy without signs/symptoms or physical distress. Continue progressive overload as per policy without signs/symptoms or physical distress.     Resistance Training   Training Prescription  Yes Yes     Weight  4 4     Reps  10-15 10-15     Interval Training   Interval Training  No No     NuStep   Level  4 5     Watts  35 35     Minutes  20 25     Recumbant Elliptical   Level   2     RPM   35     Watts   15     Minutes   15        Discharge Exercise Prescription (Final Exercise Prescription  Changes):     Exercise Prescription Changes - 08/29/15 0600    Exercise Review   Progression Yes   Response to Exercise   Blood Pressure (Admit) 180/90 mmHg   Blood Pressure (Exercise) 184/90 mmHg   Blood Pressure (Exit) 160/80 mmHg   Heart Rate (Admit) 90 bpm   Heart Rate (Exercise) 91 bpm   Heart Rate (Exit) 85 bpm   Rating of Perceived Exertion (Exercise) 12   Symptoms High BP the last few visits. Informed his wife who said they will consult MD.   Comments [name] is making increases on hjs own which is great. He is responding well to the exercise and progressing steadily on all of the equipment.   Duration Progress to 30 minutes of continuous aerobic without signs/symptoms of physical distress   Intensity Rest + 30   Progression Continue progressive overload as per policy without signs/symptoms or physical distress.   Resistance Training   Training Prescription Yes   Weight 4   Reps 10-15   Interval Training   Interval Training No   NuStep   Level 5   Watts 35   Minutes 25   Recumbant  Elliptical   Level 2   RPM 35   Watts 15   Minutes 15      Nutrition:  Target Goals: Understanding of nutrition guidelines, daily intake of sodium <1536m, cholesterol <2013m calories 30% from fat and 7% or less from saturated fats, daily to have 5 or more servings of fruits and vegetables.  Biometrics:     Pre Biometrics - 07/28/15 1523    Pre Biometrics   Height 6' (1.829 m)   Weight 247 lb 1.6 oz (112.084 kg)   Waist Circumference 46 inches   Hip Circumference 50 inches   Waist to Hip Ratio 0.92 %   BMI (Calculated) 33.6       Nutrition Therapy Plan and Nutrition Goals:     Nutrition Therapy & Goals - 08/10/15 1802    Nutrition Therapy   Diet 1700-1800kcal Diabetes diet   Drug/Food Interactions Statins/Certain Fruits   Fiber 30 grams   Whole Grain Foods 3 servings   Protein 7 ounces/day   Saturated Fats 13 max. grams   Fruits and Vegetables 8 servings/day    Personal Nutrition Goals   Personal Goal #1 Keep working to control food portions, especially starchy foods (fist-size or less)   Personal Goal #2 Eat plenty of vegetables and fruits (small-medium portions of fruits to control blood sugar)      Nutrition Discharge: Rate Your Plate Scores:     Rate Your Plate - 0927/74/1288786  Rate Your Plate Scores   Pre Score 62   Pre Score % 69 %      Nutrition Goals Re-Evaluation:     Nutrition Goals Re-Evaluation      08/24/15 1333           Personal Goal #1 Re-Evaluation   Personal Goal #1 EaSufianeports he is doing much better with his food portions!       Personal Goal #2 Re-Evaluation   Goal Progress Seen Yes          Psychosocial: Target Goals: Acknowledge presence or absence of depression, maximize coping skills, provide positive support system. Participant is able to verbalize types and ability to use techniques and skills needed for reducing stress and depression.  Initial Review & Psychosocial Screening:     Initial Psych Review & Screening - 07/28/15 1912    Initial Review   Current issues with Current Sleep Concerns;Current Depression;Current Psychotropic Meds      Quality of Life Scores:     Quality of Life - 08/04/15 1734    Quality of Life Scores   Health/Function Pre 3.6 %   Socioeconomic Pre 20 %   Psych/Spiritual Pre 18 %   Family Pre 22.8 %   GLOBAL Pre 12.55 %      PHQ-9:     Recent Review Flowsheet Data    [facility] 07/28/2015 03/11/2015 12/26/2012   Decreased Interest 1 0 0   Down, Depressed, Hopeless 1 0 0   PHQ - 2 Score 2 0 0   Altered sleeping 2 - -   Tired, decreased energy 3 - -   Change in appetite 1 - -   Feeling bad or failure about yourself  0 - -   Trouble concentrating 2 - -   Moving slowly or fidgety/restless 0 - -   Suicidal thoughts 0 - -   PHQ-9 Score 10 - -   Difficult doing work/chores Not difficult at all - -      Psychosocial  Evaluation and  Intervention:     Psychosocial Evaluation - 08/03/15 1720    Psychosocial Evaluation & Interventions   Interventions Stress management education;Relaxation education;Encouraged to exercise with the program and follow exercise prescription   Comments Counselor met with Mr. Hall today for initial psychosocial evaluation.  He is a 69 year old who had 6 stents inserted over the summer and has spent several months in rehabiliation for nerve damage in his leg as a result.  He has a strong support system with a spouse of 38 years and several adult children living close by as well as active involvement in his local church community.  He reports difficulty sleeping since he was taken off a sleep aid by his Dr. and his appetite has decreased since the surgery.  He reports a history of depression and anxiety and has been addressing this through medication and talk therapy.  He states his current mood is positive and he is optimistic about this program.  His goals are to improve his balance, increase his energy and stamina and lose some more weight.  Counselor encouraged him to check with his Dr. or pharmacist re: an OTC sleep aid since the Melatonin he is using is not working.  Counselor also taught deep breathing and muscle relaxation techniques and will follow with him as needed.   Continued Psychosocial Services Needed --  Mr. Spradley will benefit from the psychoeducational components of this program considering his history of depression and anxiety.  He also will benefit from consistent exercise and learning relaxation techniques.       Psychosocial Re-Evaluation:     Psychosocial Re-Evaluation      08/24/15 1332 08/24/15 1711         Psychosocial Re-Evaluation   Interventions Encouraged to attend Cardiac Rehabilitation for the exercise       Comments --  Natalio so was happy that he was able to attend a high school football game with his walker but still able to go. with his wife.  Mr. Idrovo reported  today that he is still having difficulty sleeping at night with several medication changes initiated by his doctor, without success.  He agreed to continue to work with his doctor on this and report back if any improvements.  Otherwise, he reports he is feeling "pretty well" and is experiencing benefits from this program.           Vocational Rehabilitation: Provide vocational rehab assistance to qualifying candidates.   Vocational Rehab Evaluation & Intervention:     Vocational Rehab - 07/28/15 1827    Initial Vocational Rehab Evaluation & Intervention   Assessment shows need for Vocational Rehabilitation No  Patient is retired      Education: Education Goals: Education classes will be provided on a weekly basis, covering required topics. Participant will state understanding/return demonstration of topics presented.  Learning Barriers/Preferences:     Learning Barriers/Preferences - 07/28/15 1827    Learning Barriers/Preferences   Learning Barriers Hearing   Learning Preferences Group Instruction      Education Topics: General Nutrition Guidelines/Fats and Fiber: -Group instruction provided by verbal, written material, models and posters to present the general guidelines for heart healthy nutrition. Gives an explanation and review of dietary fats and fiber.          Cardiac Rehab from 08/29/2015 in [facility] Cardiac Rehab   Date  08/29/15   Educator  PI   Instruction Review Code  2- meets goals/outcomes      Controlling  Sodium/Reading Food Labels: -Group verbal and written material supporting the discussion of sodium use in heart healthy nutrition. Review and explanation with models, verbal and written materials for utilization of the food label.   Exercise Physiology & Risk Factors: - Group verbal and written instruction with models to review the exercise physiology of the cardiovascular system and associated critical values. Details cardiovascular disease risk factors and  the goals associated with each risk factor.   Aerobic Exercise & Resistance Training: - Gives group verbal and written discussion on the health impact of inactivity. On the components of aerobic and resistive training programs and the benefits of this training and how to safely progress through these programs.      Cardiac Rehab from 08/29/2015 in [facility] Cardiac Rehab   Date  08/01/15   Educator  SW   Instruction Review Code  2- meets goals/outcomes      Flexibility, Balance, General Exercise Guidelines: - Provides group verbal and written instruction on the benefits of flexibility and balance training programs. Provides general exercise guidelines with specific guidelines to those with heart or lung disease. Demonstration and skill practice provided.      Cardiac Rehab from 08/29/2015 in [facility] Cardiac Rehab   Date  08/08/15   Educator  SW   Instruction Review Code  2- meets goals/outcomes      Stress Management: - Provides group verbal and written instruction about the health risks of elevated stress, cause of high stress, and healthy ways to reduce stress.      Cardiac Rehab from 08/29/2015 in [facility] Cardiac Rehab   Date  08/10/15   Educator  Kathreen Cornfield   Instruction Review Code  2- meets goals/outcomes      Depression: - Provides group verbal and written instruction on the correlation between heart/lung disease and depressed mood, treatment options, and the stigmas associated with seeking treatment.   Anatomy & Physiology of the Heart: - Group verbal and written instruction and models provide basic cardiac anatomy and physiology, with the coronary electrical and arterial systems. Review of: AMI, Angina, Valve disease, Heart Failure, Cardiac Arrhythmia, Pacemakers, and the ICD.   Cardiac Procedures: - Group verbal and written instruction and models to describe the testing methods done to diagnose heart disease. Reviews the outcomes of the test results. Describes the treatment  choices: Medical Management, Angioplasty, or Coronary Bypass Surgery.   Cardiac Medications: - Group verbal and written instruction to review commonly prescribed medications for heart disease. Reviews the medication, class of the drug, and side effects. Includes the steps to properly store meds and maintain the prescription regimen.   Go Sex-Intimacy & Heart Disease, Get SMART - Goal Setting: - Group verbal and written instruction through game format to discuss heart disease and the return to sexual intimacy. Provides group verbal and written material to discuss and apply goal setting through the application of the S.M.A.R.T. Method.   Other Matters of the Heart: - Provides group verbal, written materials and models to describe Heart Failure, Angina, Valve Disease, and Diabetes in the realm of heart disease. Includes description of the disease process and treatment options available to the cardiac patient.      Cardiac Rehab from 08/29/2015 in [facility] Cardiac Rehab   Date  08/24/15   Educator  DW   Instruction Review Code  2- meets goals/outcomes      Exercise & Equipment Safety: - Individual verbal instruction and demonstration of equipment use and safety with use of the equipment.  Cardiac Rehab from 08/29/2015 in [facility] Cardiac Rehab   Date  07/28/15   Educator  DW   Instruction Review Code  1- partially meets, needs review/practice      Infection Prevention: - Provides verbal and written material to individual with discussion of infection control including proper hand washing and proper equipment cleaning during exercise session.      Cardiac Rehab from 08/29/2015 in [facility] Cardiac Rehab   Date  07/28/15   Educator  DW   Instruction Review Code  2- meets goals/outcomes      Falls Prevention: - Provides verbal and written material to individual with discussion of falls prevention and safety.      Cardiac Rehab from 08/29/2015 in [facility] Cardiac Rehab   Date  07/28/15   Educator   DW   Instruction Review Code  1- partially meets, needs review/practice      Diabetes: - Individual verbal and written instruction to review signs/symptoms of diabetes, desired ranges of glucose level fasting, after meals and with exercise. Advice that pre and post exercise glucose checks will be done for 3 sessions at entry of program.      Cardiac Rehab from 08/29/2015 in [facility] Cardiac Rehab   Date  07/28/15   Educator  DW   Instruction Review Code  2- meets goals/outcomes       Knowledge Questionnaire Score:     Knowledge Questionnaire Score - 08/04/15 1733    Knowledge Questionnaire Score   Pre Score 24      Personal Goals and Risk Factors at Admission:     Personal Goals and Risk Factors at Admission - 07/28/15 1829    Personal Goals and Risk Factors on Admission    Weight Management Yes   Intervention Learn and follow the exercise and diet guidelines while in the program. Utilize the nutrition and education classes to help gain knowledge of the diet and exercise expectations in the program   Admit Weight 247 lb 1.6 oz (112.084 kg)   Goal Weight 195 lb (88.451 kg)   Increase Aerobic Exercise and Physical Activity Yes   Intervention While in program, learn and follow the exercise prescription taught. Start at a low level workload and increase workload after able to maintain previous level for 30 minutes. Increase time before increasing intensity.   Take Less Medication Yes   Intervention Learn your risk factors and begin the lifestyle modifications for risk factor control during your time in the program.   Understand more about Heart/Pulmonary Disease. Yes   Intervention While in program utilize professionals for any questions, and attend the education sessions. Great websites to use are www.americanheart.org or www.lung.org for reliable information.   Diabetes Yes   Goal Blood glucose control identified by blood glucose values, HgbA1C. Participant verbalizes understanding  of the signs/symptoms of hyper/hypo glycemia, proper foot care and importance of medication and nutrition plan for blood glucose control.   Intervention Provide nutrition & aerobic exercise along with prescribed medications to achieve blood glucose in normal ranges: Fasting 65-99 mg/dL   Hypertension Yes   Goal Participant will see blood pressure controlled within the values of 140/71m/Hg or within value directed by their physician.   Intervention Provide nutrition & aerobic exercise along with prescribed medications to achieve BP 140/90 or less.   Lipids Yes   Goal Cholesterol controlled with medications as prescribed, with individualized exercise RX and with personalized nutrition plan. Value goals: LDL < 760m HDL > 4053mParticipant states understanding of desired cholesterol  values and following prescriptions.   Intervention Provide nutrition & aerobic exercise along with prescribed medications to achieve LDL <42m, HDL >487m   Stress No   Personal Goal Other Yes   Personal Goal Improve balance   Intervention Other  Throughout program increase endurance, overall conditioning, strength and flexibility.        Personal Goals and Risk Factors Review:      Goals and Risk Factor Review      08/08/15 1752 08/24/15 1330         Increase Aerobic Exercise and Physical Activity   Goals Progress/Improvement seen  Yes Yes      Comments Was able to increase duration of exercise to 33 min on the NS. Patient stated that his legs felt stronger and he is starting to feel  better.  BrVevelyn Royalsaid he is able to do things that he hasn't been able to do lately like go to a high school football game.       Diabetes   Goal  --  Blood sugars are more stable. He is eating smaller portions of food.       Hypertension   Goal  --  Samuell's blood pressure has been well controlled-see LSI sheets.      Abnormal Lipids   Goal  --  EaRobsonontinues to take his cholestrol medicine.          Personal Goals  Discharge (Final Personal Goals and Risk Factors Review):      Goals and Risk Factor Review - 08/24/15 1330    Increase Aerobic Exercise and Physical Activity   Goals Progress/Improvement seen  Yes   Comments BrVevelyn Royalsaid he is able to do things that he hasn't been able to do lately like go to a high school football game.    Diabetes   Goal --  Blood sugars are more stable. He is eating smaller portions of food.    Hypertension   Goal --  Maika's blood pressure has been well controlled-see LSI sheets.   Abnormal Lipids   Goal --  EaShaquilleontinues to take his cholestrol medicine.        Comments: BrVevelyn Royalsas able to go to a local football game with his wife!

## 2015-09-01 ENCOUNTER — Encounter: Payer: Medicare Other | Admitting: *Deleted

## 2015-09-01 DIAGNOSIS — Z955 Presence of coronary angioplasty implant and graft: Secondary | ICD-10-CM | POA: Diagnosis not present

## 2015-09-01 DIAGNOSIS — I2510 Atherosclerotic heart disease of native coronary artery without angina pectoris: Secondary | ICD-10-CM

## 2015-09-01 DIAGNOSIS — Z9861 Coronary angioplasty status: Secondary | ICD-10-CM | POA: Diagnosis not present

## 2015-09-01 NOTE — Progress Notes (Signed)
Daily Session Note  Patient Details  Name: BRENIN HEIDELBERGER MRN: [id number] Date of Birth: 1946-07-06 Referring Provider:  [name], MD  Encounter Date: 09/01/2015  Check In:      Goals Met:  Proper associated with RPD/PD & O2 Sat  Goals Unmet:    Goals Comments: Chayton Murata went to the [facility] with his walker and stated he fell back on the commode. Zymere reported no injury and was able to walk with no problems after.    Dr. Emily Filbert is Medical Director for [facility].

## 2015-09-05 DIAGNOSIS — Z9861 Coronary angioplasty status: Secondary | ICD-10-CM | POA: Diagnosis not present

## 2015-09-05 DIAGNOSIS — Z955 Presence of coronary angioplasty implant and graft: Secondary | ICD-10-CM | POA: Diagnosis not present

## 2015-09-05 DIAGNOSIS — I2510 Atherosclerotic heart disease of native coronary artery without angina pectoris: Secondary | ICD-10-CM

## 2015-09-05 NOTE — Progress Notes (Signed)
Daily Session Note  Patient Details  Name: Steve Phillips MRN: [id number] Date of Birth: 11-11-1946 Referring Provider:  [name], MD  Encounter Date: 09/05/2015  Check In:     Session Check In - 09/05/15 1608    Check-In   Staff Present Heath Lark RN, BSN, CCRP;Chantalle Defilippo BS, ACSM EP-C, Exercise Physiologist;Carroll Radio producer, BSN   ER physicians immediately available to respond to emergencies See telemetry face sheet for immediately available ER MD   Medication changes reported     No   Fall or balance concerns reported    No   Warm-up and Cool-down Performed on first and last piece of equipment   VAD Patient? No   Pain Assessment   Currently in Pain? No/denies         Goals Met:  Proper associated with RPD/PD & O2 Sat Exercise tolerated well No report of cardiac concerns or symptoms Strength training completed today  Goals Unmet:  Not Applicable  Goals Comments:    Dr. Emily Filbert is Medical Director for [facility].

## 2015-09-07 ENCOUNTER — Encounter: Payer: Medicare Other | Admitting: *Deleted

## 2015-09-07 DIAGNOSIS — Z9861 Coronary angioplasty status: Secondary | ICD-10-CM | POA: Diagnosis not present

## 2015-09-07 DIAGNOSIS — I2510 Atherosclerotic heart disease of native coronary artery without angina pectoris: Secondary | ICD-10-CM | POA: Diagnosis not present

## 2015-09-07 DIAGNOSIS — Z955 Presence of coronary angioplasty implant and graft: Secondary | ICD-10-CM | POA: Diagnosis not present

## 2015-09-07 NOTE — Progress Notes (Signed)
Daily Session Note  Patient Details  Name: ZALEN SEQUEIRA MRN: [id number] Date of Birth: 1946-05-26 Referring Provider:  [name], MD  Encounter Date: 09/07/2015  Check In:     Session Check In - 09/07/15 1726    Check-In   Staff Present Diane Joya Gaskins RN, Drusilla Kanner MS, ACSM CEP Exercise Physiologist;Carroll Enterkin RN, BSN   ER physicians immediately available to respond to emergencies See telemetry face sheet for immediately available ER MD   Medication changes reported     No   Fall or balance concerns reported    No   Warm-up and Cool-down Performed on first and last piece of equipment   VAD Patient? No   Pain Assessment   Currently in Pain? No/denies   Multiple Pain Sites No         Goals Met:  [place] with exercise equipment Exercise tolerated well No report of cardiac concerns or symptoms Strength training completed today  Goals Unmet:  Not Applicable  Goals Comments: Patient completed exercise prescription and all exercise goals during rehab session. The exercise was tolerated well and the patient is progressing in the program.    Dr. Emily Filbert is Medical Director for [facility] and [facility].

## 2015-09-08 ENCOUNTER — Encounter: Payer: Self-pay | Admitting: *Deleted

## 2015-09-08 DIAGNOSIS — Z9861 Coronary angioplasty status: Principal | ICD-10-CM

## 2015-09-08 DIAGNOSIS — I2510 Atherosclerotic heart disease of native coronary artery without angina pectoris: Secondary | ICD-10-CM

## 2015-09-08 DIAGNOSIS — Z955 Presence of coronary angioplasty implant and graft: Secondary | ICD-10-CM | POA: Diagnosis not present

## 2015-09-08 NOTE — Progress Notes (Signed)
Daily Session Note  Patient Details  Name: Steve Phillips MRN: [id number] Date of Birth: October 13, 1946 Referring Provider:  [name], MD  Encounter Date: 09/08/2015  Check In:     Session Check In - 09/08/15 1646    Check-In   Staff Present Nyoka Cowden RN;Carroll Enterkin RN, BSN;Nemiah Bubar BS, ACSM EP-C, Exercise Physiologist   ER physicians immediately available to respond to emergencies See telemetry face sheet for immediately available ER MD   Medication changes reported     No   Fall or balance concerns reported    No   Warm-up and Cool-down Performed on first and last piece of equipment   VAD Patient? No   Pain Assessment   Currently in Pain? No/denies         Goals Met:  Proper associated with RPD/PD & O2 Sat Exercise tolerated well No report of cardiac concerns or symptoms Strength training completed today  Goals Unmet:  Not Applicable  Goals Comments:    Dr. Emily Filbert is Medical Director for [facility].

## 2015-09-12 DIAGNOSIS — Z9861 Coronary angioplasty status: Principal | ICD-10-CM

## 2015-09-12 DIAGNOSIS — I2510 Atherosclerotic heart disease of native coronary artery without angina pectoris: Secondary | ICD-10-CM | POA: Diagnosis not present

## 2015-09-12 DIAGNOSIS — Z955 Presence of coronary angioplasty implant and graft: Secondary | ICD-10-CM | POA: Diagnosis not present

## 2015-09-12 NOTE — Progress Notes (Signed)
Daily Session Note  Patient Details  Name: GEOFFREY HYNES MRN: [id number] Date of Birth: [birth year] Referring Provider:  Jackolyn Confer, MD  Encounter Date: 09/12/2015  Check In:     Session Check In - 09/12/15 1639    Check-In   Staff Present Heath Lark RN, BSN, CCRP;Jazmin Vensel BS, ACSM EP-C, Exercise Physiologist;Carroll Enterkin RN, BSN   ER physicians immediately available to respond to emergencies See telemetry face sheet for immediately available ER MD   Medication changes reported     No   Fall or balance concerns reported    No   Warm-up and Cool-down Performed on first and last piece of equipment   VAD Patient? No   Pain Assessment   Currently in Pain? No/denies         Goals Met:  Proper associated with RPD/PD & O2 Sat Exercise tolerated well No report of cardiac concerns or symptoms Strength training completed today  Goals Unmet:  Not Applicable  Goals Comments:    Dr. Emily Filbert is Medical Director for [facility].

## 2015-09-12 NOTE — Progress Notes (Signed)
Daily Session Note  Patient Details  Name: Steve Phillips MRN: [id number] Date of Birth: February 29, 1946 Referring Provider:  Jackolyn Confer, MD  Encounter Date: 09/12/2015  Check In:     Session Check In - 09/12/15 1639    Check-In   Staff Present Heath Lark RN, BSN, CCRP;Steven Way BS, ACSM EP-C, Exercise Physiologist;Carroll Enterkin RN, BSN   ER physicians immediately available to respond to emergencies See telemetry face sheet for immediately available ER MD   Medication changes reported     No   Fall or balance concerns reported    No   Warm-up and Cool-down Performed on first and last piece of equipment   VAD Patient? No   Pain Assessment   Currently in Pain? No/denies         Goals Met:  Exercise tolerated well No report of cardiac concerns or symptoms  Goals Unmet:  Not Applicable  Goals Comments:    Dr. Emily Filbert is Medical Director for [facility].

## 2015-09-13 ENCOUNTER — Ambulatory Visit (INDEPENDENT_AMBULATORY_CARE_PROVIDER_SITE_OTHER): Payer: Medicare Other | Admitting: Internal Medicine

## 2015-09-13 ENCOUNTER — Encounter: Payer: Self-pay | Admitting: Internal Medicine

## 2015-09-13 VITALS — BP 124/68 | HR 82 | Temp 97.6°F | Resp 12 | Wt 248.0 lb

## 2015-09-13 DIAGNOSIS — E1149 Type 2 diabetes mellitus with other diabetic neurological complication: Secondary | ICD-10-CM | POA: Diagnosis not present

## 2015-09-13 DIAGNOSIS — I200 Unstable angina: Secondary | ICD-10-CM | POA: Diagnosis not present

## 2015-09-13 MED ORDER — INSULIN REGULAR HUMAN 100 UNIT/ML IJ SOLN: 30.0000 [IU] | Freq: Three times a day (TID) | INTRAMUSCULAR | Status: DC

## 2015-09-13 MED ORDER — CANAGLIFLOZIN 100 MG PO TABS: 100.0000 mg | ORAL_TABLET | Freq: Every day | ORAL | Status: DC

## 2015-09-13 NOTE — Patient Instructions (Signed)
Please continue: - Metformin 1000 mg 2x a day. - Lantus 55 units 2x a day. - Regular insulin: - 30 units before a regular meal - 46 units before a larger meal  Please start Invokana 100 mg in am, before breakfast.  Please stay well hydrated while on Invokana. Please let me know if you develop dizziness, nausea or dehydration on it. In thois case, we need to reduce your fluid pill dose.  Please return in 1 month with your sugar log.   Please check with your insurance whether they cover: - Victoza, Tanzeum, Bydureon, Trulicity - Invokana or Jardiance

## 2015-09-13 NOTE — Progress Notes (Signed)
Patient ID: Steve Phillips, male   DOB: 1946/07/22, 69 y.o.   MRN: [id number]  HPI: Steve Phillips is a 69 y.o.-year-old male, returning for f/u for DM2, dx 1990's, insulin-dependent, uncontrolled, with complications (CAD, PN, CKD). Last visit 1.5  mo ago. He is here with his wife who offers part of the hx.  Reviewed hx: He was admitted for cord compression sxs in 04/2005 and had a long period of rehab. Sugar initially very poorly controlled >> we started U500 insulin >> but he had N/V >> we had to stop and switch to U500 insulin. Lately, sugars are great! Latest HbA1c was greatly decreased, to 7.2%!  Last hemoglobin A1c was: Lab Results  Component Value Date   HGBA1C 7.2* 07/21/2015   HGBA1C 9.1* 05/06/2015   HGBA1C 8.7* 03/05/2015  Was on Prednisone and Kenalog injections.  He was on U500 insulin: - 65 units before each meal. He had nausea with U500 and Novolog.  Now on: - Lantus 40 >> 55 units 2x a day (increased by PCP recently) - Metformin 1000 mg 2x day - Regular insulin - added 07/2015 30 units before a regular meal 46 units before a larger meal  Pt checks his sugars 2-3x a day and they are - brings log: - am: 102-242 >> 69-145, 186 >> 112-313 >> 160, 237 >>  86-161 >> 100-161 - 2h after b'fast: 260-318 >> 119, 183-250 >> 222-298, 416 >> n/c >> 182, 190 >> 150, 190-245 - before lunch: 211-227 >> forgot >> 237 >> 165-231 >> 204-244 >> n/c >> 165 >> 286 >> n/c >> 116-262 - 2h after lunch: 251, 356 >> 49x1 (took insulin w/o eating), 73-248 >> n/c >> 158, 246 >> 210-287, 369 - before dinner: 121, 216-246 >> 58 -185 >> 154 >> 119, 238 >> 214 >> 292 >> 188-201 >> 160 - 2h after dinner: 145-158, 299x1 >> 201-330 >> 109-273 >> 226-361, 499 >> n/c >> 223-250 >> 197-247, 308 - bedtime: 54x1, 81-119 >> 167-257 >> 104-298 >>> 145-278 >> n/c >> 222-337 (watermelon) >> 137-154 + lows - lowest 53 >> 86 >> 100 ; he has hypoglycemia awareness at 70.  Highest sugar was 297 >> 499 >> 600s with  steroids >> 337 >> 300s  Pt's meals are - but he eats constantly! - Breakfast: 2 eggs, sausage, toast, coffee - Lunch: sandwich or leftovers - Dinner: meat + veggies + bread, tea or milk - Snacks: pm and late at night  - + mild CKD, last BUN/creatinine:  Lab Results  Component Value Date   BUN 11 07/21/2015   CREATININE 1.12 07/21/2015  On Lisinopril.  - last set of lipids: Lab Results  Component Value Date   CHOL 94 03/05/2015   HDL 30* 03/05/2015   LDLCALC 50 03/05/2015   LDLDIRECT 116.0 11/16/2014   TRIG 70 03/05/2015   CHOLHDL 5 11/16/2014  On Crestor. - last eye exam was 01/28/2014 ([name]). No DR.  - + no sensation in legs below knee. He sees his podiatrist q 3 mo ([name]). Foot exam performed 04/2015.  I reviewed pt's medications, allergies, PMH, social hx, family hx, and changes were documented in the history of present illness. Otherwise, unchanged from my initial visit note.  ROS: Constitutional: no weight gain/loss, no increased appetite, no fatigue, no subjective hypo/hyperthermia, + poor sleep Eyes:no blurry vision, no xerophthalmia ENT: no sore throat, no nodules palpated in throat, no dysphagia/odynophagia, no hoarseness Cardiovascular: no CP/no SOB/palpitations/leg swelling Respiratory:  no cough/SOB Gastrointestinal: + N/no V/D/C Musculoskeletal: + muscle aches/+ joint aches/+ back pain; + left leg pain Skin: no rashes Neurological: no tremors/+ numbness - up to knees/tingling/dizziness  PE: BP 124/68 mmHg  Pulse 82  Temp(Src) 97.6 F (36.4 C) (Oral)  Resp 12  Wt 248 lb (112.492 kg)  SpO2 95% Body mass index is 33.63 kg/(m^2). Wt Readings from Last 3 Encounters:  09/13/15 248 lb (112.492 kg)  08/19/15 246 lb 4 oz (111.698 kg)  08/15/15 243 lb (110.224 kg)   Constitutional: overweight, walks with walker >> developped nausea and dry heaving during the visit (CBG 250) Eyes: PERRLA, EOMI, no exophthalmos ENT: moist mucous membranes,  no thyromegaly, no cervical lymphadenopathy Cardiovascular: RRR, No MRG, + leg swelling bilaterally Respiratory: CTA B Gastrointestinal: abdomen soft, NT, ND, BS+ Musculoskeletal: no deformities Skin: moist, warm, no rashes  ASSESSMENT: 1. DM2, insulin-dependent, uncontrolled, with complications - CAD, s/p multiple PCI with stent RCA,LAD and obtuse marginal, followed @ [facility] on the accord study.., cath 02/2012 90% D1, s/p drug-eluting stent - Dr. Fletcher [name] - Peripheral neuropathy - CKD, mild  - tried U500 >> nausea He was also in the Accord Study for 8 years.   PLAN:  1. Patient with long-standing, uncontrolled, diabetes, now just on Lantus and Metformin. Sugars are higher despite increase in Lantus and Regular insulin. Will try to add Invokana. I will not decrease HCTZ yet, but may need to do this. - I suggested to:  Patient Instructions  Please continue: - Metformin 1000 mg 2x a day. - Lantus 55 units 2x a day. - Regular insulin: - 30 units before a regular meal - 46 units before a larger meal  Please start Invokana 100 mg in am, before breakfast.  Please stay well hydrated while on Invokana. Please let me know if you develop dizziness, nausea or dehydration on it. In thois case, we need to reduce your fluid pill dose.  Please return in 1 month with your sugar log.   Please check with your insurance whether they cover: - Victoza, Tanzeum, Bydureon, Trulicity - Invokana or Jardiance    - continue checking sugars at different times of the day - check 3 times a day, rotating checks - Needs a new eye exam >> will schedule - got the flu shot this season - RTC in 1 mo

## 2015-09-15 NOTE — Progress Notes (Signed)
Cardiac Individual Treatment Plan  Patient Details  Name: Steve Phillips MRN: [id number] Date of Birth: 11/14/1946 Referring Provider:  [name], MD  Initial Encounter Date:  07/28/2015  Visit Diagnosis: CAD S/P percutaneous coronary angioplasty  Status post coronary artery stent placement  Patient's Home Medications on Admission:  Current outpatient prescriptions:  .  acetaminophen (TYLENOL) 325 MG tablet, Take 2 tablets (650 mg total) by mouth every 4 (four) hours as needed for headache or mild pain., Disp: , Rfl:  .  aspirin EC 81 MG tablet, Take 1 tablet (81 mg total) by mouth daily., Disp: , Rfl:  .  canagliflozin (INVOKANA) 100 MG TABS tablet, Take 1 tablet (100 mg total) by mouth daily., Disp: 30 tablet, Rfl: 2 .  carbidopa-levodopa (SINEMET IR) 25-100 MG per tablet, Take 2 tablets by mouth 3 (three) times daily. , Disp: , Rfl:  .  carvedilol (COREG) 25 MG tablet, Take 1 tablet (25 mg total) by mouth 2 (two) times daily., Disp: 180 tablet, Rfl: 3 .  clonazePAM (KLONOPIN) 0.5 MG tablet, Take 1 tablet (0.5 mg total) by mouth at bedtime., Disp: 30 tablet, Rfl: 3 .  clopidogrel (PLAVIX) 75 MG tablet, Take 1 tablet (75 mg total) by mouth daily with breakfast., Disp: 90 tablet, Rfl: 1 .  cyanocobalamin (,VITAMIN B-12,) 1000 MCG/ML injection, Inject 1,000 mcg into the muscle every 30 (thirty) days. , Disp: , Rfl:  .  DULoxetine (CYMBALTA) 60 MG capsule, Take 1 capsule (60 mg total) by mouth daily. TAKE 1 CAPSULE DAILY, Disp: 90 capsule, Rfl: 1 .  gabapentin (NEURONTIN) 300 MG capsule, Take 1 capsule (300 mg total) by mouth 2 (two) times daily., Disp: 180 capsule, Rfl: 1 .  HYDROcodone-acetaminophen (NORCO/VICODIN) 5-325 MG per tablet, 1-2 po tid prn Earliest Fill Date: 08/04/15, Disp: , Rfl:  .  insulin glargine (LANTUS) 100 UNIT/ML injection, Inject 55 Units into the skin 2 (two) times daily. , Disp: , Rfl:  .  insulin regular (HUMULIN R) 100 units/mL injection, Inject 0.3-0.46 mLs  (30-46 Units total) into the skin 3 (three) times daily before meals., Disp: 40 mL, Rfl: 2 .  lisinopril-hydrochlorothiazide (PRINZIDE,ZESTORETIC) 20-12.5 MG tablet, Take 1 tablet by mouth 2 (two) times daily., Disp: 180 tablet, Rfl: 1 .  metFORMIN (GLUCOPHAGE) 1000 MG tablet, Take 1,000 mg by mouth 2 (two) times daily with a meal., Disp: , Rfl:  .  nitroGLYCERIN (NITROSTAT) 0.4 MG SL tablet, Place 1 tablet (0.4 mg total) under the tongue every 5 (five) minutes as needed for chest pain., Disp: 30 tablet, Rfl: 6 .  pantoprazole (PROTONIX) 40 MG tablet, Take 1 tablet (40 mg total) by mouth 2 (two) times daily., Disp: 180 tablet, Rfl: 3 .  simvastatin (ZOCOR) 20 MG tablet, Take 1 tablet (20 mg total) by mouth at bedtime., Disp: 90 tablet, Rfl: 3  Past Medical History: Past Medical History  Diagnosis Date  . Shingles   . Hyperlipidemia   . Lower leg pain     chronic ,left leg  . Acute angina ([place])   . Depression   . CAD (coronary artery disease)     s/p multiple PCI with stent RCA,LAD and obtuse marginal,followed @ [facility] on the accord study.., cath 02/2012 90% D1, s/p drug-eluting stent Dr. Fletcher [name]  . Shortness of breath   . Hypertension     dr Jerilynn Mages Audelia Acton     labauer in [place]  . Bell's palsy   . Left leg pain   . Squamous cell carcinoma  of arm 2014    left  . Type II diabetes mellitus ([place])   . GERD (gastroesophageal reflux disease)   . Headache(784.0)     "sometimes daily; sometimes weekly" (05/16/2015)  . Stroke [facility]) 2015    denies residual on 05/16/2015  . Chronic lower back pain     "for 3 months now" (05/16/2015)  . Diabetic peripheral neuropathy ([facility])     "feet"  . Falls frequently     "in the last month or so" (05/16/2015)  . 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 Former Smoker -- 0.12 packs/day for 10 years  . Types: Cigarettes  . Quit date: 11/08/1971  Smokeless tobacco  . Never Used    Labs: Recent Review Flowsheet Data     Labs for ITP Cardiac and Pulmonary Rehab Latest Ref Rng 11/16/2014 02/16/2015 03/05/2015 05/06/2015 07/21/2015   Cholestrol - 189 - 94 - -   LDLCALC - - - 50 - -   LDLDIRECT - 116.0 - - - -   HDL - 38.90(L) - 30(L) - -   Trlycerides - 306.0(H) - 70 - -   Hemoglobin A1c 4.6 - 6.5 % 9.7(H) 9.1(H) 8.7(H) 9.1(H) 7.2(H)       Exercise Target Goals:    Exercise Program Goal: Individual exercise prescription set with THRR, safety & activity barriers. Participant demonstrates ability to understand and report RPE using BORG scale, to self-measure pulse accurately, and to acknowledge the importance of the exercise prescription.  Exercise Prescription Goal: Starting with aerobic activity 30 plus minutes a day, 3 days per week for initial exercise prescription. Provide home exercise prescription and guidelines that participant acknowledges understanding prior to discharge.  Activity Barriers & Risk Stratification:     Activity Barriers & Risk Stratification - 09/01/15 1731    Activity Barriers & Risk Stratification   Activity Barriers Balance Concerns;Assistive Device  Had his walker today with him like Dustan usually does.      6 Minute Walk:     6 Minute Walk      07/28/15 1519       6 Minute Walk   Phase Initial     Distance 355 feet     Walk Time 6 minutes     Resting HR 57 bpm     Resting BP 122/70 mmHg     Max Ex. HR 107 bpm     Max Ex. BP 146/70 mmHg     RPE 13     Symptoms No        Initial Exercise Prescription:     Initial Exercise Prescription - 07/28/15 1500    Date of Initial Exercise Prescription   Date 07/28/15   Treadmill   MPH 1   Grade 0   Minutes 10   Recumbant Bike   Level 2   RPM 40   Watts 20   Minutes 10   NuStep   Level 2   Watts 40   Minutes 10   Arm Ergometer   Level 1   Watts 10   Minutes 10   REL-XR   Level 2   Watts 40   Minutes 10   Prescription Details   Frequency (times per week) 3   Duration Progress to 30 minutes of  continuous aerobic without signs/symptoms of physical distress   Intensity   THRR REST +  30   Ratings of Perceived Exertion 11-15   Perceived Dyspnea 2-4  Progression Continue progressive overload as per policy without signs/symptoms or physical distress.   Resistance Training   Training Prescription Yes   Weight 2   Reps 10-15      Exercise Prescription Changes:     Exercise Prescription Changes      08/01/15 0600 08/17/15 1700 08/29/15 0600       Exercise Review   Progression No  Has not started program yet Yes Yes     Response to Exercise   Blood Pressure (Admit)   180/90 mmHg     Blood Pressure (Exercise)   184/90 mmHg     Blood Pressure (Exit)   160/80 mmHg     Heart Rate (Admit)   90 bpm     Heart Rate (Exercise)   91 bpm     Heart Rate (Exit)   85 bpm     Rating of Perceived Exertion (Exercise)   12     Symptoms  None High BP the last few visits. Informed his wife who said they will consult MD.     Comments  Reviewed individualized exercise prescription and made increases per departmental policy. Exercise increases were discussed with the patient and they were able to perform the new work loads without issue (no signs or symptoms).  [name] is making increases on hjs own which is great. He is responding well to the exercise and progressing steadily on all of the equipment.     Duration  Progress to 30 minutes of continuous aerobic without signs/symptoms of physical distress Progress to 30 minutes of continuous aerobic without signs/symptoms of physical distress     Intensity  Rest + 30 Rest + 30     Progression  Continue progressive overload as per policy without signs/symptoms or physical distress. Continue progressive overload as per policy without signs/symptoms or physical distress.     Resistance Training   Training Prescription  Yes Yes     Weight  4 4     Reps  10-15 10-15     Interval Training   Interval Training  No No     NuStep   Level  4 5     Watts  35  35     Minutes  20 25     Recumbant Elliptical   Level   2     RPM   35     Watts   15     Minutes   15        Discharge Exercise Prescription (Final Exercise Prescription Changes):     Exercise Prescription Changes - 08/29/15 0600    Exercise Review   Progression Yes   Response to Exercise   Blood Pressure (Admit) 180/90 mmHg   Blood Pressure (Exercise) 184/90 mmHg   Blood Pressure (Exit) 160/80 mmHg   Heart Rate (Admit) 90 bpm   Heart Rate (Exercise) 91 bpm   Heart Rate (Exit) 85 bpm   Rating of Perceived Exertion (Exercise) 12   Symptoms High BP the last few visits. Informed his wife who said they will consult MD.   Comments [name] is making increases on hjs own which is great. He is responding well to the exercise and progressing steadily on all of the equipment.   Duration Progress to 30 minutes of continuous aerobic without signs/symptoms of physical distress   Intensity Rest + 30   Progression Continue progressive overload as per policy without signs/symptoms or physical distress.   Resistance Training   Training Prescription Yes  Weight 4   Reps 10-15   Interval Training   Interval Training No   NuStep   Level 5   Watts 35   Minutes 25   Recumbant Elliptical   Level 2   RPM 35   Watts 15   Minutes 15      Nutrition:  Target Goals: Understanding of nutrition guidelines, daily intake of sodium <1533m, cholesterol <2032m calories 30% from fat and 7% or less from saturated fats, daily to have 5 or more servings of fruits and vegetables.  Biometrics:     Pre Biometrics - 07/28/15 1523    Pre Biometrics   Height 6' (1.829 m)   Weight 247 lb 1.6 oz (112.084 kg)   Waist Circumference 46 inches   Hip Circumference 50 inches   Waist to Hip Ratio 0.92 %   BMI (Calculated) 33.6       Nutrition Therapy Plan and Nutrition Goals:     Nutrition Therapy & Goals - 08/10/15 1802    Nutrition Therapy   Diet 1700-1800kcal Diabetes diet   Drug/Food  Interactions Statins/Certain Fruits   Fiber 30 grams   Whole Grain Foods 3 servings   Protein 7 ounces/day   Saturated Fats 13 max. grams   Fruits and Vegetables 8 servings/day   Personal Nutrition Goals   Personal Goal #1 Keep working to control food portions, especially starchy foods (fist-size or less)   Personal Goal #2 Eat plenty of vegetables and fruits (small-medium portions of fruits to control blood sugar)      Nutrition Discharge: Rate Your Plate Scores:     Rate Your Plate - 0931/49/7082637  Rate Your Plate Scores   Pre Score 62   Pre Score % 69 %      Nutrition Goals Re-Evaluation:     Nutrition Goals Re-Evaluation      08/24/15 1333 09/08/15 1248         Personal Goal #1 Re-Evaluation   Personal Goal #1 EaHuzaifaeports he is doing much better with his food portions!       Personal Goal #2 Re-Evaluation   Goal Progress Seen Yes       Comments  EaFaviotates he is following his nutrition guidelines. Recently he has had family reunion and vacation time that has created nutrition challenges.  He is back to better eating habits since the events.         Psychosocial: Target Goals: Acknowledge presence or absence of depression, maximize coping skills, provide positive support system. Participant is able to verbalize types and ability to use techniques and skills needed for reducing stress and depression.  Initial Review & Psychosocial Screening:     Initial Psych Review & Screening - 07/28/15 1912    Initial Review   Current issues with Current Sleep Concerns;Current Depression;Current Psychotropic Meds      Quality of Life Scores:     Quality of Life - 08/04/15 1734    Quality of Life Scores   Health/Function Pre 3.6 %   Socioeconomic Pre 20 %   Psych/Spiritual Pre 18 %   Family Pre 22.8 %   GLOBAL Pre 12.55 %      PHQ-9:     Recent Review Flowsheet Data    [facility] 07[facility]28[facility]2015 03/11/2015 12/26/2012   Decreased Interest 1 0 0   Down,  Depressed, Hopeless 1 0 0   PHQ - 2 Score 2 0 0   Altered sleeping 2 - -  Tired, decreased energy 3 - -   Change in appetite 1 - -   Feeling bad or failure about yourself  0 - -   Trouble concentrating 2 - -   Moving slowly or fidgety/restless 0 - -   Suicidal thoughts 0 - -   PHQ-9 Score 10 - -   Difficult doing work/chores Not difficult at all - -      Psychosocial Evaluation and Intervention:     Psychosocial Evaluation - 08/03/15 1720    Psychosocial Evaluation & Interventions   Interventions Stress management education;Relaxation education;Encouraged to exercise with the program and follow exercise prescription   Comments Counselor met with Mr. Livingstone today for initial psychosocial evaluation.  He is a 69 year old who had 6 stents inserted over the summer and has spent several months in rehabiliation for nerve damage in his leg as a result.  He has a strong support system with a spouse of 48 years and several adult children living close by as well as active involvement in his local church community.  He reports difficulty sleeping since he was taken off a sleep aid by his Dr. and his appetite has decreased since the surgery.  He reports a history of depression and anxiety and has been addressing this through medication and talk therapy.  He states his current mood is positive and he is optimistic about this program.  His goals are to improve his balance, increase his energy and stamina and lose some more weight.  Counselor encouraged him to check with his Dr. or pharmacist re: an OTC sleep aid since the Melatonin he is using is not working.  Counselor also taught deep breathing and muscle relaxation techniques and will follow with him as needed.   Continued Psychosocial Services Needed --  Mr. Lux will benefit from the psychoeducational components of this program considering his history of depression and anxiety.  He also will benefit from consistent exercise and learning relaxation  techniques.       Psychosocial Re-Evaluation:     Psychosocial Re-Evaluation      08/24/15 1332 08/24/15 1711         Psychosocial Re-Evaluation   Interventions Encouraged to attend Cardiac Rehabilitation for the exercise       Comments --  Markhi so was happy that he was able to attend a high school football game with his walker but still able to go. with his wife.  Mr. Buddenhagen reported today that he is still having difficulty sleeping at night with several medication changes initiated by his doctor, without success.  He agreed to continue to work with his doctor on this and report back if any improvements.  Otherwise, he reports he is feeling "pretty well" and is experiencing benefits from this program.           Vocational Rehabilitation: Provide vocational rehab assistance to qualifying candidates.   Vocational Rehab Evaluation & Intervention:     Vocational Rehab - 07/28/15 1827    Initial Vocational Rehab Evaluation & Intervention   Assessment shows need for Vocational Rehabilitation No  Patient is retired      Education: Education Goals: Education classes will be provided on a weekly basis, covering required topics. Participant will state understanding/return demonstration of topics presented.  Learning Barriers/Preferences:     Learning Barriers/Preferences - 07/28/15 1827    Learning Barriers/Preferences   Learning Barriers Hearing   Learning Preferences Group Instruction      Education Topics: General Nutrition Guidelines/Fats and  Fiber: -Group instruction provided by verbal, written material, models and posters to present the general guidelines for heart healthy nutrition. Gives an explanation and review of dietary fats and fiber.          Cardiac Rehab from 09/12/2015 in [facility] Cardiac Rehab   Date  08/29/15   Educator  PI   Instruction Review Code  2- meets goals/outcomes      Controlling Sodium/Reading Food Labels: -Group verbal and written material  supporting the discussion of sodium use in heart healthy nutrition. Review and explanation with models, verbal and written materials for utilization of the food label.      Cardiac Rehab from 09/12/2015 in [facility] Cardiac Rehab   Date  09/05/15   Educator  PI   Instruction Review Code  2- meets goals/outcomes      Exercise Physiology & Risk Factors: - Group verbal and written instruction with models to review the exercise physiology of the cardiovascular system and associated critical values. Details cardiovascular disease risk factors and the goals associated with each risk factor.   Aerobic Exercise & Resistance Training: - Gives group verbal and written discussion on the health impact of inactivity. On the components of aerobic and resistive training programs and the benefits of this training and how to safely progress through these programs.      Cardiac Rehab from 09/12/2015 in [facility] Cardiac Rehab   Date  08/01/15   Educator  SW   Instruction Review Code  2- meets goals/outcomes      Flexibility, Balance, General Exercise Guidelines: - Provides group verbal and written instruction on the benefits of flexibility and balance training programs. Provides general exercise guidelines with specific guidelines to those with heart or lung disease. Demonstration and skill practice provided.      Cardiac Rehab from 09/12/2015 in [facility] Cardiac Rehab   Date  08/08/15   Educator  SW   Instruction Review Code  2- meets goals/outcomes      Stress Management: - Provides group verbal and written instruction about the health risks of elevated stress, cause of high stress, and healthy ways to reduce stress.      Cardiac Rehab from 09/12/2015 in [facility] Cardiac Rehab   Date  08/10/15   Educator  Kathreen Cornfield   Instruction Review Code  2- meets goals/outcomes      Depression: - Provides group verbal and written instruction on the correlation between heart/lung disease and depressed mood, treatment  options, and the stigmas associated with seeking treatment.   Anatomy & Physiology of the Heart: - Group verbal and written instruction and models provide basic cardiac anatomy and physiology, with the coronary electrical and arterial systems. Review of: AMI, Angina, Valve disease, Heart Failure, Cardiac Arrhythmia, Pacemakers, and the ICD.   Cardiac Procedures: - Group verbal and written instruction and models to describe the testing methods done to diagnose heart disease. Reviews the outcomes of the test results. Describes the treatment choices: Medical Management, Angioplasty, or Coronary Bypass Surgery.      Cardiac Rehab from 09/12/2015 in [facility] Cardiac Rehab   Date  09/12/15   Educator  SB   Instruction Review Code  2- meets goals/outcomes      Cardiac Medications: - Group verbal and written instruction to review commonly prescribed medications for heart disease. Reviews the medication, class of the drug, and side effects. Includes the steps to properly store meds and maintain the prescription regimen.   Go Sex-Intimacy & Heart Disease, Get SMART - Goal Setting: -  Group verbal and written instruction through game format to discuss heart disease and the return to sexual intimacy. Provides group verbal and written material to discuss and apply goal setting through the application of the S.M.A.R.T. Method.      Cardiac Rehab from 09/12/2015 in [facility] Cardiac Rehab   Date  09/12/15   Educator  SB   Instruction Review Code  2- meets goals/outcomes      Other Matters of the Heart: - Provides group verbal, written materials and models to describe Heart Failure, Angina, Valve Disease, and Diabetes in the realm of heart disease. Includes description of the disease process and treatment options available to the cardiac patient.      Cardiac Rehab from 09/12/2015 in [facility] Cardiac Rehab   Date  08/24/15   Educator  DW   Instruction Review Code  2- meets goals/outcomes      Exercise &  Equipment Safety: - Individual verbal instruction and demonstration of equipment use and safety with use of the equipment.      Cardiac Rehab from 09/12/2015 in [facility] Cardiac Rehab   Date  07/28/15   Educator  DW   Instruction Review Code  1- partially meets, needs review/practice      Infection Prevention: - Provides verbal and written material to individual with discussion of infection control including proper hand washing and proper equipment cleaning during exercise session.      Cardiac Rehab from 09/12/2015 in [facility] Cardiac Rehab   Date  07/28/15   Educator  DW   Instruction Review Code  2- meets goals/outcomes      Falls Prevention: - Provides verbal and written material to individual with discussion of falls prevention and safety.      Cardiac Rehab from 09/12/2015 in [facility] Cardiac Rehab   Date  07/28/15   Educator  DW   Instruction Review Code  1- partially meets, needs review/practice      Diabetes: - Individual verbal and written instruction to review signs/symptoms of diabetes, desired ranges of glucose level fasting, after meals and with exercise. Advice that pre and post exercise glucose checks will be done for 3 sessions at entry of program.      Cardiac Rehab from 09/12/2015 in [facility] Cardiac Rehab   Date  07/28/15   Educator  DW   Instruction Review Code  2- meets goals/outcomes       Knowledge Questionnaire Score:     Knowledge Questionnaire Score - 08/04/15 1733    Knowledge Questionnaire Score   Pre Score 24      Personal Goals and Risk Factors at Admission:     Personal Goals and Risk Factors at Admission - 07/28/15 1829    Personal Goals and Risk Factors on Admission    Weight Management Yes   Intervention Learn and follow the exercise and diet guidelines while in the program. Utilize the nutrition and education classes to help gain knowledge of the diet and exercise expectations in the program   Admit Weight 247 lb 1.6 oz (112.084 kg)   Goal  Weight 195 lb (88.451 kg)   Increase Aerobic Exercise and Physical Activity Yes   Intervention While in program, learn and follow the exercise prescription taught. Start at a low level workload and increase workload after able to maintain previous level for 30 minutes. Increase time before increasing intensity.   Take Less Medication Yes   Intervention Learn your risk factors and begin the lifestyle modifications for risk factor control during your time  in the program.   Understand more about Heart/Pulmonary Disease. Yes   Intervention While in program utilize professionals for any questions, and attend the education sessions. Great websites to use are www.americanheart.org or www.lung.org for reliable information.   Diabetes Yes   Goal Blood glucose control identified by blood glucose values, HgbA1C. Participant verbalizes understanding of the signs/symptoms of hyper/hypo glycemia, proper foot care and importance of medication and nutrition plan for blood glucose control.   Intervention Provide nutrition & aerobic exercise along with prescribed medications to achieve blood glucose in normal ranges: Fasting 65-99 mg/dL   Hypertension Yes   Goal Participant will see blood pressure controlled within the values of 140/49m/Hg or within value directed by their physician.   Intervention Provide nutrition & aerobic exercise along with prescribed medications to achieve BP 140/90 or less.   Lipids Yes   Goal Cholesterol controlled with medications as prescribed, with individualized exercise RX and with personalized nutrition plan. Value goals: LDL < 722m HDL > 4089mParticipant states understanding of desired cholesterol values and following prescriptions.   Intervention Provide nutrition & aerobic exercise along with prescribed medications to achieve LDL <22m53mDL >40mg42mStress No   Personal Goal Other Yes   Personal Goal Improve balance   Intervention Other  Throughout program increase endurance,  overall conditioning, strength and flexibility.        Personal Goals and Risk Factors Review:      Goals and Risk Factor Review      08/08/15 1752 08/24/15 1330 09/01/15 1735 09/08/15 1250     Weight Management   Goals Progress/Improvement seen    No    Comments    Kaelob Saahirurrently up 5 pounds.  He atteributes this to vacation indulgences and family reunion meals!  He is currently returned to his better eating habits and expects to see the weight return to pre vacation level. He is eating less snacks, no bedtime snack. Less frequent AM snacks and is working on portion control during his meals.     Increase Aerobic Exercise and Physical Activity   Goals Progress/Improvement seen  Yes Yes No Yes    Comments Was able to increase duration of exercise to 33 min on the NS. Patient stated that his legs felt stronger and he is starting to feel  better.  BrittVevelyn Royals he is able to do things that he hasn't been able to do lately like go to a high school football game.  Dodger Shelvy Perazzo to the Bathroom with his walker and stated he fell back on the commode. Vicky Dantoniorted no injury and was able to walk with no problems after. Ottavio Bartley Vuolorts that he falls at home alot. Does NOT use the treadmill her but only uses the sit down [employer] equipment.  Syrus Biffxercising at a pool 45 min for 2 days a week in addition to his CR classes. He is walking at home some.     Take Less Medication   Goals Progress/Improvement seen    No    Comments    No changes yet.  Adi Duncan not anticipate changes soon.  He is aware that continued effort with the lifestyle changes can decrease his need for some of his medications.    Diabetes   Goal  --  Blood sugars are more stable. He is eating smaller portions of food.       Hypertension   Goal  --  Laken's blood pressure has been well controlled-see  LSI sheets.      Progress seen toward goals    Yes    Comments    Jamoni's BP is controlled with his medication at present/  He is  continuiong to work on Lifestyle changes to eliminate the need for medication while maintaining BP control.    Abnormal Lipids   Goal  --  Irish continues to take his cholestrol medicine.       Progress seen towards goals    Yes    Comments    Treyvin is working on nutrition guidelines and his exercise progression to work on cholesterol control. No recent labs to see effect of his changes.        Personal Goals Discharge (Final Personal Goals and Risk Factors Review):      Goals and Risk Factor Review - 09/08/15 1250    Weight Management   Goals Progress/Improvement seen No   Comments Halvor is currently up 5 pounds.  He atteributes this to vacation indulgences and family reunion meals!  He is currently returned to his better eating habits and expects to see the weight return to pre vacation level. He is eating less snacks, no bedtime snack. Less frequent AM snacks and is working on portion control during his meals.    Increase Aerobic Exercise and Physical Activity   Goals Progress/Improvement seen  Yes   Comments Juwan is exercising at a pool 45 min for 2 days a week in addition to his CR classes. He is walking at home some.    Take Less Medication   Goals Progress/Improvement seen No   Comments No changes yet.  Jamaar does not anticipate changes soon.  He is aware that continued effort with the lifestyle changes can decrease his need for some of his medications.   Hypertension   Progress seen toward goals Yes   Comments Farid's BP is controlled with his medication at present/  He is continuiong to work on Lifestyle changes to eliminate the need for medication while maintaining BP control.   Abnormal Lipids   Progress seen towards goals Yes   Comments Jessen is working on nutrition guidelines and his exercise progression to work on cholesterol control. No recent labs to see effect of his changes.        Comments:

## 2015-09-15 NOTE — Progress Notes (Signed)
Cardiac Individual Treatment Plan  Patient Details  Name: Steve Phillips MRN: [id number] Date of Birth: Feb 11, 1946 Referring Provider:  [name], MD  Initial Encounter Date:    Visit Diagnosis: CAD S/P percutaneous coronary angioplasty  Status post coronary artery stent placement  Patient's Home Medications on Admission:  Current outpatient prescriptions:  .  acetaminophen (TYLENOL) 325 MG tablet, Take 2 tablets (650 mg total) by mouth every 4 (four) hours as needed for headache or mild pain., Disp: , Rfl:  .  aspirin EC 81 MG tablet, Take 1 tablet (81 mg total) by mouth daily., Disp: , Rfl:  .  canagliflozin (INVOKANA) 100 MG TABS tablet, Take 1 tablet (100 mg total) by mouth daily., Disp: 30 tablet, Rfl: 2 .  carbidopa-levodopa (SINEMET IR) 25-100 MG per tablet, Take 2 tablets by mouth 3 (three) times daily. , Disp: , Rfl:  .  carvedilol (COREG) 25 MG tablet, Take 1 tablet (25 mg total) by mouth 2 (two) times daily., Disp: 180 tablet, Rfl: 3 .  clonazePAM (KLONOPIN) 0.5 MG tablet, Take 1 tablet (0.5 mg total) by mouth at bedtime., Disp: 30 tablet, Rfl: 3 .  clopidogrel (PLAVIX) 75 MG tablet, Take 1 tablet (75 mg total) by mouth daily with breakfast., Disp: 90 tablet, Rfl: 1 .  cyanocobalamin (,VITAMIN B-12,) 1000 MCG/ML injection, Inject 1,000 mcg into the muscle every 30 (thirty) days. , Disp: , Rfl:  .  DULoxetine (CYMBALTA) 60 MG capsule, Take 1 capsule (60 mg total) by mouth daily. TAKE 1 CAPSULE DAILY, Disp: 90 capsule, Rfl: 1 .  gabapentin (NEURONTIN) 300 MG capsule, Take 1 capsule (300 mg total) by mouth 2 (two) times daily., Disp: 180 capsule, Rfl: 1 .  HYDROcodone-acetaminophen (NORCO/VICODIN) 5-325 MG per tablet, 1-2 po tid prn Earliest Fill Date: 08/04/15, Disp: , Rfl:  .  insulin glargine (LANTUS) 100 UNIT/ML injection, Inject 55 Units into the skin 2 (two) times daily. , Disp: , Rfl:  .  insulin regular (HUMULIN R) 100 units/mL injection, Inject 0.3-0.46 mLs (30-46  Units total) into the skin 3 (three) times daily before meals., Disp: 40 mL, Rfl: 2 .  lisinopril-hydrochlorothiazide (PRINZIDE,ZESTORETIC) 20-12.5 MG tablet, Take 1 tablet by mouth 2 (two) times daily., Disp: 180 tablet, Rfl: 1 .  metFORMIN (GLUCOPHAGE) 1000 MG tablet, Take 1,000 mg by mouth 2 (two) times daily with a meal., Disp: , Rfl:  .  nitroGLYCERIN (NITROSTAT) 0.4 MG SL tablet, Place 1 tablet (0.4 mg total) under the tongue every 5 (five) minutes as needed for chest pain., Disp: 30 tablet, Rfl: 6 .  pantoprazole (PROTONIX) 40 MG tablet, Take 1 tablet (40 mg total) by mouth 2 (two) times daily., Disp: 180 tablet, Rfl: 3 .  simvastatin (ZOCOR) 20 MG tablet, Take 1 tablet (20 mg total) by mouth at bedtime., Disp: 90 tablet, Rfl: 3  Past Medical History: Past Medical History  Diagnosis Date  . Shingles   . Hyperlipidemia   . Lower leg pain     chronic ,left leg  . Acute angina (Steve Phillips)   . Depression   . CAD (coronary artery disease)     s/p multiple PCI with stent RCA,LAD and obtuse marginal,followed @ [facility] on the accord study.., cath 02/2012 90% D1, s/p drug-eluting stent Dr. Fletcher Phillips  . Shortness of breath   . Hypertension     dr Steve Phillips     [facility] in [place]  . Bell's palsy   . Left leg pain   . Squamous cell carcinoma  of arm 2014    left  . Type II diabetes mellitus ([place])   . GERD (gastroesophageal reflux disease)   . Headache(784.0)     "sometimes daily; sometimes weekly" (05/16/2015)  . Stroke [facility]) 2015    denies residual on 05/16/2015  . Chronic lower back pain     "for 3 months now" (05/16/2015)  . Diabetic peripheral neuropathy ([facility])     "feet"  . Falls frequently     "in the last month or so" (05/16/2015)  . 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 Former Smoker -- 0.12 packs/day for 10 years  . Types: Cigarettes  . Quit date: 11/08/1971  Smokeless tobacco  . Never Used    Labs: Recent Review Flowsheet Data     Labs for ITP Cardiac and Pulmonary Rehab Latest Ref Rng 11/16/2014 02/16/2015 03/05/2015 05/06/2015 07/21/2015   Cholestrol - 189 - 94 - -   LDLCALC - - - 50 - -   LDLDIRECT - 116.0 - - - -   HDL - 38.90(L) - 30(L) - -   Trlycerides - 306.0(H) - 70 - -   Hemoglobin A1c 4.6 - 6.5 % 9.7(H) 9.1(H) 8.7(H) 9.1(H) 7.2(H)       Exercise Target Goals:    Exercise Program Goal: Individual exercise prescription set with THRR, safety & activity barriers. Participant demonstrates ability to understand and report RPE using BORG scale, to self-measure pulse accurately, and to acknowledge the importance of the exercise prescription.  Exercise Prescription Goal: Starting with aerobic activity 30 plus minutes a day, 3 days per week for initial exercise prescription. Provide home exercise prescription and guidelines that participant acknowledges understanding prior to discharge.  Activity Barriers & Risk Stratification:     Activity Barriers & Risk Stratification - 09/01/15 1731    Activity Barriers & Risk Stratification   Activity Barriers Balance Concerns;Assistive Device  Had his walker today with him like [name] usually does.      6 Minute Walk:     6 Minute Walk      07/28/15 1519       6 Minute Walk   Phase Initial     Distance 355 feet     Walk Time 6 minutes     Resting HR 57 bpm     Resting BP 122/70 mmHg     Max Ex. HR 107 bpm     Max Ex. BP 146/70 mmHg     RPE 13     Symptoms No        Initial Exercise Prescription:     Initial Exercise Prescription - 07/28/15 1500    Date of Initial Exercise Prescription   Date 07/28/15   Treadmill   MPH 1   Grade 0   Minutes 10   Recumbant Bike   Level 2   RPM 40   Watts 20   Minutes 10   NuStep   Level 2   Watts 40   Minutes 10   Arm Ergometer   Level 1   Watts 10   Minutes 10   REL-XR   Level 2   Watts 40   Minutes 10   Prescription Details   Frequency (times per week) 3   Duration Progress to 30 minutes of  continuous aerobic without signs/symptoms of physical distress   Intensity   THRR REST +  30   Ratings of Perceived Exertion 11-15   Perceived Dyspnea 2-4  Progression Continue progressive overload as per policy without signs/symptoms or physical distress.   Resistance Training   Training Prescription Yes   Weight 2   Reps 10-15      Exercise Prescription Changes:     Exercise Prescription Changes      08/01/15 0600 08/17/15 1700 08/29/15 0600       Exercise Review   Progression No  Has not started program yet Yes Yes     Response to Exercise   Blood Pressure (Admit)   180/90 mmHg     Blood Pressure (Exercise)   184/90 mmHg     Blood Pressure (Exit)   160/80 mmHg     Heart Rate (Admit)   90 bpm     Heart Rate (Exercise)   91 bpm     Heart Rate (Exit)   85 bpm     Rating of Perceived Exertion (Exercise)   12     Symptoms  None High BP the last few visits. Informed his wife who said they will consult MD.     Comments  Reviewed individualized exercise prescription and made increases per departmental policy. Exercise increases were discussed with the patient and they were able to perform the new work loads without issue (no signs or symptoms).  [name] is making increases on hjs own which is great. He is responding well to the exercise and progressing steadily on all of the equipment.     Duration  Progress to 30 minutes of continuous aerobic without signs/symptoms of physical distress Progress to 30 minutes of continuous aerobic without signs/symptoms of physical distress     Intensity  Rest + 30 Rest + 30     Progression  Continue progressive overload as per policy without signs/symptoms or physical distress. Continue progressive overload as per policy without signs/symptoms or physical distress.     Resistance Training   Training Prescription  Yes Yes     Weight  4 4     Reps  10-15 10-15     Interval Training   Interval Training  No No     NuStep   Level  4 5     Watts  35  35     Minutes  20 25     Recumbant Elliptical   Level   2     RPM   35     Watts   15     Minutes   15        Discharge Exercise Prescription (Final Exercise Prescription Changes):     Exercise Prescription Changes - 08/29/15 0600    Exercise Review   Progression Yes   Response to Exercise   Blood Pressure (Admit) 180/90 mmHg   Blood Pressure (Exercise) 184/90 mmHg   Blood Pressure (Exit) 160/80 mmHg   Heart Rate (Admit) 90 bpm   Heart Rate (Exercise) 91 bpm   Heart Rate (Exit) 85 bpm   Rating of Perceived Exertion (Exercise) 12   Symptoms High BP the last few visits. Informed his wife who said they will consult MD.   Comments [name] is making increases on hjs own which is great. He is responding well to the exercise and progressing steadily on all of the equipment.   Duration Progress to 30 minutes of continuous aerobic without signs/symptoms of physical distress   Intensity Rest + 30   Progression Continue progressive overload as per policy without signs/symptoms or physical distress.   Resistance Training   Training Prescription Yes  Weight 4   Reps 10-15   Interval Training   Interval Training No   NuStep   Level 5   Watts 35   Minutes 25   Recumbant Elliptical   Level 2   RPM 35   Watts 15   Minutes 15      Nutrition:  Target Goals: Understanding of nutrition guidelines, daily intake of sodium <15107m, cholesterol <2022m calories 30% from fat and 7% or less from saturated fats, daily to have 5 or more servings of fruits and vegetables.  Biometrics:     Pre Biometrics - 07/28/15 1523    Pre Biometrics   Height 6' (1.829 m)   Weight 247 lb 1.6 oz (112.084 kg)   Waist Circumference 46 inches   Hip Circumference 50 inches   Waist to Hip Ratio 0.92 %   BMI (Calculated) 33.6       Nutrition Therapy Plan and Nutrition Goals:     Nutrition Therapy & Goals - 08/10/15 1802    Nutrition Therapy   Diet 1700-1800kcal Diabetes diet   Drug/Food  Interactions Statins/Certain Fruits   Fiber 30 grams   Whole Grain Foods 3 servings   Protein 7 ounces/day   Saturated Fats 13 max. grams   Fruits and Vegetables 8 servings/day   Personal Nutrition Goals   Personal Goal #1 Keep working to control food portions, especially starchy foods (fist-size or less)   Personal Goal #2 Eat plenty of vegetables and fruits (small-medium portions of fruits to control blood sugar)      Nutrition Discharge: Rate Your Plate Scores:     Rate Your Plate - 0995/09/3286712  Rate Your Plate Scores   Pre Score 62   Pre Score % 69 %      Nutrition Goals Re-Evaluation:     Nutrition Goals Re-Evaluation      08/24/15 1333 09/08/15 1248         Personal Goal #1 Re-Evaluation   Personal Goal #1 EaNathaneports he is doing much better with his food portions!       Personal Goal #2 Re-Evaluation   Goal Progress Seen Yes       Comments  EaGaylintates he is following his nutrition guidelines. Recently he has had family reunion and vacation time that has created nutrition challenges.  He is back to better eating habits since the events.         Psychosocial: Target Goals: Acknowledge presence or absence of depression, maximize coping skills, provide positive support system. Participant is able to verbalize types and ability to use techniques and skills needed for reducing stress and depression.  Initial Review & Psychosocial Screening:     Initial Psych Review & Screening - 07/28/15 1912    Initial Review   Current issues with Current Sleep Concerns;Current Depression;Current Psychotropic Meds      Quality of Life Scores:     Quality of Life - 08/04/15 1734    Quality of Life Scores   Health/Function Pre 3.6 %   Socioeconomic Pre 20 %   Psych/Spiritual Pre 18 %   Family Pre 22.8 %   GLOBAL Pre 12.55 %      PHQ-9:     Recent Review Flowsheet Data    [facility] 07/28[facility]2015 03/11/2015 12/26/2012   Decreased Interest 1 0 0   Down,  Depressed, Hopeless 1 0 0   PHQ - 2 Score 2 0 0   Altered sleeping 2 - -  Tired, decreased energy 3 - -   Change in appetite 1 - -   Feeling bad or failure about yourself  0 - -   Trouble concentrating 2 - -   Moving slowly or fidgety/restless 0 - -   Suicidal thoughts 0 - -   PHQ-9 Score 10 - -   Difficult doing work/chores Not difficult at all - -      Psychosocial Evaluation and Intervention:     Psychosocial Evaluation - 08/03/15 1720    Psychosocial Evaluation & Interventions   Interventions Stress management education;Relaxation education;Encouraged to exercise with the program and follow exercise prescription   Comments Counselor met with Mr. Livingstone today for initial psychosocial evaluation.  He is a 69 year old who had 6 stents inserted over the summer and has spent several months in rehabiliation for nerve damage in his leg as a result.  He has a strong support system with a spouse of 48 years and several adult children living close by as well as active involvement in his local church community.  He reports difficulty sleeping since he was taken off a sleep aid by his Dr. and his appetite has decreased since the surgery.  He reports a history of depression and anxiety and has been addressing this through medication and talk therapy.  He states his current mood is positive and he is optimistic about this program.  His goals are to improve his balance, increase his energy and stamina and lose some more weight.  Counselor encouraged him to check with his Dr. or pharmacist re: an OTC sleep aid since the Melatonin he is using is not working.  Counselor also taught deep breathing and muscle relaxation techniques and will follow with him as needed.   Continued Psychosocial Services Needed --  Mr. Lux will benefit from the psychoeducational components of this program considering his history of depression and anxiety.  He also will benefit from consistent exercise and learning relaxation  techniques.       Psychosocial Re-Evaluation:     Psychosocial Re-Evaluation      08/24/15 1332 08/24/15 1711         Psychosocial Re-Evaluation   Interventions Encouraged to attend Cardiac Rehabilitation for the exercise       Comments --  Markhi so was happy that he was able to attend a high school football game with his walker but still able to go. with his wife.  Mr. Buddenhagen reported today that he is still having difficulty sleeping at night with several medication changes initiated by his doctor, without success.  He agreed to continue to work with his doctor on this and report back if any improvements.  Otherwise, he reports he is feeling "pretty well" and is experiencing benefits from this program.           Vocational Rehabilitation: Provide vocational rehab assistance to qualifying candidates.   Vocational Rehab Evaluation & Intervention:     Vocational Rehab - 07/28/15 1827    Initial Vocational Rehab Evaluation & Intervention   Assessment shows need for Vocational Rehabilitation No  Patient is retired      Education: Education Goals: Education classes will be provided on a weekly basis, covering required topics. Participant will state understanding/return demonstration of topics presented.  Learning Barriers/Preferences:     Learning Barriers/Preferences - 07/28/15 1827    Learning Barriers/Preferences   Learning Barriers Hearing   Learning Preferences Group Instruction      Education Topics: General Nutrition Guidelines/Fats and  Fiber: -Group instruction provided by verbal, written material, models and posters to present the general guidelines for heart healthy nutrition. Gives an explanation and review of dietary fats and fiber.          Cardiac Rehab from 09/12/2015 in [facility] Cardiac Rehab   Date  08/29/15   Educator  PI   Instruction Review Code  2- meets goals/outcomes      Controlling Sodium/Reading Food Labels: -Group verbal and written material  supporting the discussion of sodium use in heart healthy nutrition. Review and explanation with models, verbal and written materials for utilization of the food label.      Cardiac Rehab from 09/12/2015 in [facility] Cardiac Rehab   Date  09/05/15   Educator  PI   Instruction Review Code  2- meets goals/outcomes      Exercise Physiology & Risk Factors: - Group verbal and written instruction with models to review the exercise physiology of the cardiovascular system and associated critical values. Details cardiovascular disease risk factors and the goals associated with each risk factor.   Aerobic Exercise & Resistance Training: - Gives group verbal and written discussion on the health impact of inactivity. On the components of aerobic and resistive training programs and the benefits of this training and how to safely progress through these programs.      Cardiac Rehab from 09/12/2015 in [facility] Cardiac Rehab   Date  08/01/15   Educator  SW   Instruction Review Code  2- meets goals/outcomes      Flexibility, Balance, General Exercise Guidelines: - Provides group verbal and written instruction on the benefits of flexibility and balance training programs. Provides general exercise guidelines with specific guidelines to those with heart or lung disease. Demonstration and skill practice provided.      Cardiac Rehab from 09/12/2015 in [facility] Cardiac Rehab   Date  08/08/15   Educator  SW   Instruction Review Code  2- meets goals/outcomes      Stress Management: - Provides group verbal and written instruction about the health risks of elevated stress, cause of high stress, and healthy ways to reduce stress.      Cardiac Rehab from 09/12/2015 in [facility] Cardiac Rehab   Date  08/10/15   Educator  Kathreen Cornfield   Instruction Review Code  2- meets goals/outcomes      Depression: - Provides group verbal and written instruction on the correlation between heart/lung disease and depressed mood, treatment  options, and the stigmas associated with seeking treatment.   Anatomy & Physiology of the Heart: - Group verbal and written instruction and models provide basic cardiac anatomy and physiology, with the coronary electrical and arterial systems. Review of: AMI, Angina, Valve disease, Heart Failure, Cardiac Arrhythmia, Pacemakers, and the ICD.   Cardiac Procedures: - Group verbal and written instruction and models to describe the testing methods done to diagnose heart disease. Reviews the outcomes of the test results. Describes the treatment choices: Medical Management, Angioplasty, or Coronary Bypass Surgery.      Cardiac Rehab from 09/12/2015 in [facility] Cardiac Rehab   Date  09/12/15   Educator  SB   Instruction Review Code  2- meets goals/outcomes      Cardiac Medications: - Group verbal and written instruction to review commonly prescribed medications for heart disease. Reviews the medication, class of the drug, and side effects. Includes the steps to properly store meds and maintain the prescription regimen.   Go Sex-Intimacy & Heart Disease, Get SMART - Goal Setting: -  Group verbal and written instruction through game format to discuss heart disease and the return to sexual intimacy. Provides group verbal and written material to discuss and apply goal setting through the application of the S.M.A.R.T. Method.      Cardiac Rehab from 09/12/2015 in [facility] Cardiac Rehab   Date  09/12/15   Educator  SB   Instruction Review Code  2- meets goals/outcomes      Other Matters of the Heart: - Provides group verbal, written materials and models to describe Heart Failure, Angina, Valve Disease, and Diabetes in the realm of heart disease. Includes description of the disease process and treatment options available to the cardiac patient.      Cardiac Rehab from 09/12/2015 in [facility] Cardiac Rehab   Date  08/24/15   Educator  DW   Instruction Review Code  2- meets goals/outcomes      Exercise &  Equipment Safety: - Individual verbal instruction and demonstration of equipment use and safety with use of the equipment.      Cardiac Rehab from 09/12/2015 in [facility] Cardiac Rehab   Date  07/28/15   Educator  DW   Instruction Review Code  1- partially meets, needs review/practice      Infection Prevention: - Provides verbal and written material to individual with discussion of infection control including proper hand washing and proper equipment cleaning during exercise session.      Cardiac Rehab from 09/12/2015 in [facility] Cardiac Rehab   Date  07/28/15   Educator  DW   Instruction Review Code  2- meets goals/outcomes      Falls Prevention: - Provides verbal and written material to individual with discussion of falls prevention and safety.      Cardiac Rehab from 09/12/2015 in [facility] Cardiac Rehab   Date  07/28/15   Educator  DW   Instruction Review Code  1- partially meets, needs review/practice      Diabetes: - Individual verbal and written instruction to review signs/symptoms of diabetes, desired ranges of glucose level fasting, after meals and with exercise. Advice that pre and post exercise glucose checks will be done for 3 sessions at entry of program.      Cardiac Rehab from 09/12/2015 in [facility] Cardiac Rehab   Date  07/28/15   Educator  DW   Instruction Review Code  2- meets goals/outcomes       Knowledge Questionnaire Score:     Knowledge Questionnaire Score - 08/04/15 1733    Knowledge Questionnaire Score   Pre Score 24      Personal Goals and Risk Factors at Admission:     Personal Goals and Risk Factors at Admission - 07/28/15 1829    Personal Goals and Risk Factors on Admission    Weight Management Yes   Intervention Learn and follow the exercise and diet guidelines while in the program. Utilize the nutrition and education classes to help gain knowledge of the diet and exercise expectations in the program   Admit Weight 247 lb 1.6 oz (112.084 kg)   Goal  Weight 195 lb (88.451 kg)   Increase Aerobic Exercise and Physical Activity Yes   Intervention While in program, learn and follow the exercise prescription taught. Start at a low level workload and increase workload after able to maintain previous level for 30 minutes. Increase time before increasing intensity.   Take Less Medication Yes   Intervention Learn your risk factors and begin the lifestyle modifications for risk factor control during your time  in the program.   Understand more about Heart/Pulmonary Disease. Yes   Intervention While in program utilize professionals for any questions, and attend the education sessions. Great websites to use are www.americanheart.org or www.lung.org for reliable information.   Diabetes Yes   Goal Blood glucose control identified by blood glucose values, HgbA1C. Participant verbalizes understanding of the signs/symptoms of hyper/hypo glycemia, proper foot care and importance of medication and nutrition plan for blood glucose control.   Intervention Provide nutrition & aerobic exercise along with prescribed medications to achieve blood glucose in normal ranges: Fasting 65-99 mg/dL   Hypertension Yes   Goal Participant will see blood pressure controlled within the values of 140/49m/Hg or within value directed by their physician.   Intervention Provide nutrition & aerobic exercise along with prescribed medications to achieve BP 140/90 or less.   Lipids Yes   Goal Cholesterol controlled with medications as prescribed, with individualized exercise RX and with personalized nutrition plan. Value goals: LDL < 722m HDL > 4089mParticipant states understanding of desired cholesterol values and following prescriptions.   Intervention Provide nutrition & aerobic exercise along with prescribed medications to achieve LDL <22m53mDL >40mg42mStress No   Personal Goal Other Yes   Personal Goal Improve balance   Intervention Other  Throughout program increase endurance,  overall conditioning, strength and flexibility.        Personal Goals and Risk Factors Review:      Goals and Risk Factor Review      08/08/15 1752 08/24/15 1330 09/01/15 1735 09/08/15 1250     Weight Management   Goals Progress/Improvement seen    No    Comments    Cloyd Saahirurrently up 5 pounds.  He atteributes this to vacation indulgences and family reunion meals!  He is currently returned to his better eating habits and expects to see the weight return to pre vacation level. He is eating less snacks, no bedtime snack. Less frequent AM snacks and is working on portion control during his meals.     Increase Aerobic Exercise and Physical Activity   Goals Progress/Improvement seen  Yes Yes No Yes    Comments Was able to increase duration of exercise to 33 min on the NS. Patient stated that his legs felt stronger and he is starting to feel  better.  BrittVevelyn Royals he is able to do things that he hasn't been able to do lately like go to a high school football game.  Kord Shelvy Perazzo to the Bathroom with his walker and stated he fell back on the commode. Avery Dantoniorted no injury and was able to walk with no problems after. Jonaven Bartley Vuolorts that he falls at home alot. Does NOT use the treadmill her but only uses the sit down Nustep equipment.  Nasri Biffxercising at a pool 45 min for 2 days a week in addition to his CR classes. He is walking at home some.     Take Less Medication   Goals Progress/Improvement seen    No    Comments    No changes yet.  Tadeo Duncan not anticipate changes soon.  He is aware that continued effort with the lifestyle changes can decrease his need for some of his medications.    Diabetes   Goal  --  Blood sugars are more stable. He is eating smaller portions of food.       Hypertension   Goal  --  Askari's blood pressure has been well controlled-see  LSI sheets.      Progress seen toward goals    Yes    Comments    Leamon's BP is controlled with his medication at present/  He is  continuiong to work on Lifestyle changes to eliminate the need for medication while maintaining BP control.    Abnormal Lipids   Goal  --  Seamus continues to take his cholestrol medicine.       Progress seen towards goals    Yes    Comments    Duante is working on nutrition guidelines and his exercise progression to work on cholesterol control. No recent labs to see effect of his changes.        Personal Goals Discharge (Final Personal Goals and Risk Factors Review):      Goals and Risk Factor Review - 09/08/15 1250    Weight Management   Goals Progress/Improvement seen No   Comments Andren is currently up 5 pounds.  He atteributes this to vacation indulgences and family reunion meals!  He is currently returned to his better eating habits and expects to see the weight return to pre vacation level. He is eating less snacks, no bedtime snack. Less frequent AM snacks and is working on portion control during his meals.    Increase Aerobic Exercise and Physical Activity   Goals Progress/Improvement seen  Yes   Comments Fard is exercising at a pool 45 min for 2 days a week in addition to his CR classes. He is walking at home some.    Take Less Medication   Goals Progress/Improvement seen No   Comments No changes yet.  Abdou does not anticipate changes soon.  He is aware that continued effort with the lifestyle changes can decrease his need for some of his medications.   Hypertension   Progress seen toward goals Yes   Comments Jerrett's BP is controlled with his medication at present/  He is continuiong to work on Lifestyle changes to eliminate the need for medication while maintaining BP control.   Abnormal Lipids   Progress seen towards goals Yes   Comments Broedy is working on nutrition guidelines and his exercise progression to work on cholesterol control. No recent labs to see effect of his changes.        Comments:

## 2015-09-19 ENCOUNTER — Encounter: Payer: Self-pay | Admitting: *Deleted

## 2015-09-19 ENCOUNTER — Encounter: Payer: Self-pay | Admitting: Internal Medicine

## 2015-09-19 ENCOUNTER — Ambulatory Visit (INDEPENDENT_AMBULATORY_CARE_PROVIDER_SITE_OTHER): Payer: Medicare Other | Admitting: Internal Medicine

## 2015-09-19 VITALS — BP 175/74 | HR 59 | Temp 97.6°F | Ht 72.0 in | Wt 248.2 lb

## 2015-09-19 DIAGNOSIS — E1149 Type 2 diabetes mellitus with other diabetic neurological complication: Secondary | ICD-10-CM

## 2015-09-19 DIAGNOSIS — Z955 Presence of coronary angioplasty implant and graft: Secondary | ICD-10-CM | POA: Diagnosis not present

## 2015-09-19 DIAGNOSIS — G4700 Insomnia, unspecified: Secondary | ICD-10-CM | POA: Diagnosis not present

## 2015-09-19 DIAGNOSIS — Z9861 Coronary angioplasty status: Principal | ICD-10-CM

## 2015-09-19 DIAGNOSIS — E538 Deficiency of other specified B group vitamins: Secondary | ICD-10-CM

## 2015-09-19 DIAGNOSIS — I2510 Atherosclerotic heart disease of native coronary artery without angina pectoris: Secondary | ICD-10-CM

## 2015-09-19 DIAGNOSIS — I10 Essential (primary) hypertension: Secondary | ICD-10-CM

## 2015-09-19 DIAGNOSIS — I200 Unstable angina: Secondary | ICD-10-CM

## 2015-09-19 MED ORDER — SERTRALINE HCL 25 MG PO TABS: 25.0000 mg | ORAL_TABLET | Freq: Every day | ORAL | Status: DC

## 2015-09-19 MED ORDER — CYANOCOBALAMIN 1000 MCG/ML IJ SOLN
1000.0000 ug | Freq: Once | INTRAMUSCULAR | Status: AC
  Administered 2015-09-19: 1000 ug via INTRAMUSCULAR

## 2015-09-19 NOTE — Assessment & Plan Note (Signed)
Insomnia not improved with Clonazepam or Ambien. Will try increasing dose of Gabapentin at bedtime to 600mg  and adding Sertraline for long-term better control of anxiety. Follow up in 4 weeks and prn.

## 2015-09-19 NOTE — Patient Instructions (Addendum)
Try increasing Gabapentin to 300mg  in the morning and 600mg  at bedtime.  Stop Clonazepam.  Add Sertraline 25mg  at bedtime.  Follow up in 4 weeks.

## 2015-09-19 NOTE — Progress Notes (Signed)
Subjective:    Patient ID: Steve Phillips, male    DOB: 1946/07/24, 69 y.o.   MRN: [id number]  HPI  69YO male presents for follow up.  Last seen for insomnia on 9/30.  Added Clonazepam at bedtime to help with anxiety and insomnia. He had failed treatment with Ambien. The Clonazepam did not help. Even tried 1mg  on occasion without improvement. He continues to go 2-3 days without sleep, then will sleep one night. Also tried Melatonin. Feels that anxiety is playing a role in insomnia. Fears going back to the nursing facility that he was at in the past.  Recently seen by Endocrine and started on Invokana. Has not yet picked up medication. Wife notes his blood sugars have been higher last few weeks. He notes some non-compliance with diet and increased consumption of candy.    Wt Readings from Last 3 Encounters:  09/19/15 248 lb 4 oz (112.605 kg)  09/13/15 248 lb (112.492 kg)  08/19/15 246 lb 4 oz (111.698 kg)   BP Readings from Last 3 Encounters:  09/19/15 175/74  09/13/15 124/68  08/19/15 179/72    Past Medical History  Diagnosis Date  . Shingles   . Hyperlipidemia   . Lower leg pain     chronic ,left leg  . Acute angina ([place])   . Depression   . CAD (coronary artery disease)     s/p multiple PCI with stent RCA,LAD and obtuse marginal,followed @ [facility] on the accord study.., cath 02/2012 90% D1, s/p drug-eluting stent Dr. Fletcher [name]  . Shortness of breath   . Hypertension     dr Jerilynn Mages Audelia Acton     [facility]auer in [place]  . Bell's palsy   . Left leg pain   . 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 Type II diabetes mellitus ([place])   . GERD (gastroesophageal reflux disease)   . Headache(784.0)     "sometimes daily; sometimes weekly" (05/16/2015)  . Stroke [facility]) 2015    denies residual on 05/16/2015  . Chronic lower back pain     "for 3 months now" (05/16/2015)  . Diabetic peripheral neuropathy (HCC)     "feet"  . Falls frequently     "in the last month or so" (05/16/2015)  .  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 Cancer Mother     BREAST  . Heart disease Mother     STENT  . Cancer Father     THROAT  . Heart disease Sister     STENT; HTN  . Heart attack Brother   . Hypertension Brother   . Hypertension Sister    Past Surgical History  Procedure Laterality Date  . Appendectomy    . Tibia fracture surgery Left ~ 2002  . Foot fracture surgery Left ~ 2002    "Jone's fracture"  . Testicle surgery Left 2000's    "put drainage tube in for infection"  . Back surgery    . Anterior cervical decomp/discectomy fusion N/A 04/30/2013    Procedure: ANTERIOR CERVICAL DECOMPRESSION/DISCECTOMY FUSION 2 LEVELS;  Surgeon: Faythe Ghee, MD;  Location: [facility];  Service: Neurosurgery;  Laterality: N/A;  Cervical four-five, Cervical six-seven anterior cervical decompression fusion with trabecular metal cage and plate  . Left heart catheterization with coronary angiogram N/A 02/23/2015    Procedure: LEFT HEART CATHETERIZATION WITH CORONARY ANGIOGRAM;  Surgeon: [name], MD;  Location: [facility];  Service: Cardiovascular;  Laterality: N/A;  . Cholecystectomy open  1980's  . Cardiac catheterization  02/2012  . Coronary angioplasty with stent placement  ~ 2000-02/2015    "he has a total of 6 stents" (05/16/2015  . Fracture surgery    . Squamous cell carcinoma excision Left 2014    "arm"   Social History   Social History  . Marital Status: Married    Spouse Name: N/A  . Number of Children: N/A  . Years of Education: N/A   Social History Main Topics  . Smoking status: Former Smoker -- 0.12 packs/day for 10 years    Types: Cigarettes    Quit date: 11/08/1971  . Smokeless tobacco: Never Used  . Alcohol Use: No  . Drug Use: No  . Sexual Activity: Not Currently   Other Topics Concern  . 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  Eyes: Negative for visual disturbance.  Respiratory: Negative for cough and shortness of breath.   Cardiovascular: Negative for chest pain, palpitations and leg swelling.  Gastrointestinal: Negative for nausea, vomiting, abdominal pain, diarrhea, constipation and abdominal distention.  Genitourinary: Negative for dysuria, urgency and difficulty urinating.  Musculoskeletal: Positive for myalgias, arthralgias and gait problem.  Skin: Negative for color change and rash.  Neurological: Positive for weakness and numbness.  Hematological: Negative for adenopathy.  Psychiatric/Behavioral: Positive for sleep disturbance. Negative for dysphoric mood. The patient is nervous/anxious.        Objective:    BP 175/74 mmHg  Pulse 59  Temp(Src) 97.6 F (36.4 C) (Oral)  Ht 6' (1.829 m)  Wt 248 lb 4 oz (112.605 kg)  BMI 33.66 kg/m2  SpO2 98% Physical Exam  Constitutional: He is oriented to person, place, and time. He appears well-developed and well-nourished. No distress.  HENT:  Head: Normocephalic and atraumatic.  Right Ear: External ear normal.  Left Ear: External ear normal.  Nose: Nose normal.  Mouth/Throat: Oropharynx is clear and moist. No oropharyngeal exudate.  Eyes: Conjunctivae and EOM are normal. Pupils are equal, round, and reactive to light. Right eye exhibits no discharge. Left eye exhibits no discharge. No scleral icterus.  Neck: Normal range of motion. Neck supple. No tracheal deviation present. No thyromegaly present.  Cardiovascular: Normal rate, regular rhythm and normal heart sounds.  Exam reveals no gallop and no friction rub.   No murmur heard. Pulmonary/Chest: Effort normal and breath sounds normal. No accessory muscle usage. No  tachypnea. No respiratory distress. He has no decreased breath sounds. He has no wheezes. He has no rhonchi. He has no rales. He exhibits no tenderness.  Musculoskeletal: Normal range of motion. He exhibits no edema.  Lymphadenopathy:    He has no cervical adenopathy.  Neurological: He is alert and oriented to person, place, and time. No cranial nerve deficit. Coordination normal.  Skin: Skin is warm and dry. No rash noted. He is not diaphoretic. No erythema. No pallor.  Psychiatric: He has a normal mood and affect. His behavior is normal. Judgment and thought content normal.          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 Cruzita Lederer. He is looking into cost of Invokana. Will follow. Due for A1c in 10/2015.      Relevant Orders   POCT Urinalysis Dipstick   Hypertension (Chronic)    BP Readings from Last 3 Encounters:  09/19/15 175/74  09/13/15 124/68  08/19/15 179/72   BP elevated today, however has been well controlled at rehab. Will continue current medications. Recheck later this afternoon at rehab.        Unprioritized   Insomnia - Primary    Insomnia not improved with Clonazepam or Ambien. Will try increasing dose of Gabapentin at bedtime to 600mg  and adding Sertraline for long-term better control of anxiety. Follow up in 4 weeks and prn.      Relevant Medications   sertraline (ZOLOFT) 25 MG tablet       Return in about 4 weeks (around 10/17/2015) for Recheck.

## 2015-09-19 NOTE — Progress Notes (Signed)
Pre visit review using our clinic review tool, if applicable. No additional management support is needed unless otherwise documented below in the visit note. 

## 2015-09-19 NOTE — Assessment & Plan Note (Signed)
BP Readings from Last 3 Encounters:  09/19/15 175/74  09/13/15 124/68  08/19/15 179/72   BP elevated today, however has been well controlled at rehab. Will continue current medications. Recheck later this afternoon at rehab.

## 2015-09-19 NOTE — Addendum Note (Signed)
Addended by: Vernetta Honey on: 09/19/2015 05:44 PM   Modules accepted: Orders

## 2015-09-19 NOTE — Assessment & Plan Note (Signed)
Reviewed notes from Dr. Cruzita Lederer. He is looking into cost of Invokana. Will follow. Due for A1c in 10/2015.

## 2015-09-19 NOTE — Progress Notes (Signed)
Daily Session Note  Patient Details  Name: Steve Phillips MRN: [id number] Date of Birth: 05-22-1946 Referring Provider:  [name], MD  Encounter Date: 09/19/2015  Check In:     Session Check In - 09/19/15 1622    Check-In   Staff Present Heath Lark RN, BSN, CCRP;Khali Perella BS, ACSM EP-C, Exercise Physiologist;Carroll Radio producer, BSN   ER physicians immediately available to respond to emergencies See telemetry face sheet for immediately available ER MD   Medication changes reported     No   Fall or balance concerns reported    No   Warm-up and Cool-down Performed on first and last piece of equipment   VAD Patient? No   Pain Assessment   Currently in Pain? No/denies           Exercise Prescription Changes - 09/19/15 1000    Exercise Review   Progression Yes   Response to Exercise   Blood Pressure (Admit) 126/68 mmHg   Blood Pressure (Exercise) 152/68 mmHg   Blood Pressure (Exit) 132/80 mmHg   Heart Rate (Admit) 82 bpm   Heart Rate (Exercise) 83 bpm   Heart Rate (Exit) 63 bpm   Rating of Perceived Exertion (Exercise) 13   Symptoms Blood pressure is looking better. Last visit BP was in acceptable range   Comments [name] is progressing well and increasing in his aerobic endurance.   Duration Progress to 30 minutes of continuous aerobic without signs/symptoms of physical distress   Intensity Rest + 30   Progression Continue progressive overload as per policy without signs/symptoms or physical distress.   Resistance Training   Training Prescription Yes   Weight 4   Reps 10-15   Interval Training   Interval Training No   NuStep   Level 7   Watts 50   Minutes 25   Recumbant Elliptical   Level 2   RPM 35   Watts 15   Minutes 15      Goals Met:  Proper associated with RPD/PD & O2 Sat Exercise tolerated well No report of cardiac concerns or symptoms Strength training completed today  Goals Unmet:  Not Applicable  Goals Comments:    Dr. Emily Filbert  is Medical Director for [place] and [facility].

## 2015-09-20 DIAGNOSIS — R262 Difficulty in walking, not elsewhere classified: Secondary | ICD-10-CM | POA: Diagnosis not present

## 2015-09-20 DIAGNOSIS — E1140 Type 2 diabetes mellitus with diabetic neuropathy, unspecified: Secondary | ICD-10-CM | POA: Diagnosis not present

## 2015-09-20 DIAGNOSIS — M4716 Other spondylosis with myelopathy, lumbar region: Secondary | ICD-10-CM | POA: Diagnosis not present

## 2015-09-21 ENCOUNTER — Encounter: Payer: Medicare Other | Attending: Cardiovascular Disease | Admitting: *Deleted

## 2015-09-21 DIAGNOSIS — I2510 Atherosclerotic heart disease of native coronary artery without angina pectoris: Secondary | ICD-10-CM | POA: Insufficient documentation

## 2015-09-21 DIAGNOSIS — Z9861 Coronary angioplasty status: Secondary | ICD-10-CM | POA: Insufficient documentation

## 2015-09-21 DIAGNOSIS — Z955 Presence of coronary angioplasty implant and graft: Secondary | ICD-10-CM | POA: Insufficient documentation

## 2015-09-21 NOTE — Progress Notes (Signed)
Daily Session Note  Patient Details  Name: JOSHIAH TRAYNHAM MRN: [id number] Date of Birth: 22-Oct-1946 Referring Provider:  [name], MD  Encounter Date: 09/21/2015  Check In:     Session Check In - 09/21/15 1656    Check-In   Staff Present Heath Lark RN, BSN, CCRP;Mary Kellie Shropshire RN;Renee Dillard [place] MS, ACSM CEP Exercise Physiologist   ER physicians immediately available to respond to emergencies See telemetry face sheet for immediately available ER MD   Medication changes reported     No   Fall or balance concerns reported    No   Warm-up and Cool-down Performed on first and last piece of equipment   VAD Patient? No   Pain Assessment   Currently in Pain? No/denies         Goals Met:  Independence with exercise equipment Exercise tolerated well No report of cardiac concerns or symptoms  Goals Unmet:  Not Applicable  Goals Comments: [name] states his legs are getting stronger.   Dr. Emily Filbert is Medical Director for [place] and [facility].

## 2015-09-22 ENCOUNTER — Encounter: Payer: Medicare Other | Admitting: *Deleted

## 2015-09-22 DIAGNOSIS — Z9861 Coronary angioplasty status: Secondary | ICD-10-CM | POA: Diagnosis not present

## 2015-09-22 DIAGNOSIS — Z955 Presence of coronary angioplasty implant and graft: Secondary | ICD-10-CM

## 2015-09-22 DIAGNOSIS — I2510 Atherosclerotic heart disease of native coronary artery without angina pectoris: Secondary | ICD-10-CM | POA: Diagnosis not present

## 2015-09-22 NOTE — Progress Notes (Signed)
Daily Session Note  Patient Details  Name: Steve Phillips MRN: [id number] Date of Birth: 14-Apr-1946 Referring Provider:  [name], MD  Encounter Date: 09/22/2015  Check In:     Session Check In - 09/22/15 1646    Check-In   Staff Present Gerlene Burdock RN, BSN;Steven Way BS, ACSM EP-C, Exercise Physiologist;Diane Mariana Arn, BSN   ER physicians immediately available to respond to emergencies See telemetry face sheet for immediately available ER MD   Medication changes reported     No   Fall or balance concerns reported    No   Warm-up and Cool-down Performed on first and last piece of equipment   VAD Patient? No   Pain Assessment   Currently in Pain? No/denies         Goals Met:  Proper associated with RPD/PD & O2 Sat Exercise tolerated well No report of cardiac concerns or symptoms  Goals Unmet:  Not Applicable  Goals Comments:    Dr. Emily Filbert is Medical Director for [place] and [facility].

## 2015-09-22 NOTE — Progress Notes (Signed)
Daily Session Note  Patient Details  Name: Steve Phillips MRN: [id number] Date of Birth: 12-Oct-1946 Referring Provider:  [name], MD  Encounter Date: 09/22/2015  Check In:     Session Check In - 09/22/15 1616    Check-In   Staff Present Lestine Box BS, ACSM EP-C, Exercise Physiologist;Carroll Enterkin RN, BSN;Diane Joya Gaskins RN, BSN   ER physicians immediately available to respond to emergencies See telemetry face sheet for immediately available ER MD   Medication changes reported     No   Fall or balance concerns reported    No   Warm-up and Cool-down Performed on first and last piece of equipment   VAD Patient? No   Pain Assessment   Currently in Pain? No/denies         Goals Met:  Proper associated with RPD/PD & O2 Sat Exercise tolerated well No report of cardiac concerns or symptoms Strength training completed today  Goals Unmet:  Not Applicable  Goals Comments:   Dr. Emily Filbert is Medical Director for [place] and [facility].

## 2015-09-26 ENCOUNTER — Encounter: Payer: Medicare Other | Admitting: *Deleted

## 2015-09-26 DIAGNOSIS — Z9861 Coronary angioplasty status: Secondary | ICD-10-CM | POA: Diagnosis not present

## 2015-09-26 DIAGNOSIS — Z955 Presence of coronary angioplasty implant and graft: Secondary | ICD-10-CM | POA: Diagnosis not present

## 2015-09-26 DIAGNOSIS — I2510 Atherosclerotic heart disease of native coronary artery without angina pectoris: Secondary | ICD-10-CM | POA: Diagnosis not present

## 2015-09-26 NOTE — Progress Notes (Signed)
Cardiac Individual Treatment Plan  Patient Details  Name: Steve Phillips MRN: [id number] Date of Birth: 01-Jun-1946 Referring Provider:  [name], MD  Initial Encounter Date:    Visit Diagnosis: Status post coronary artery stent placement  Patient's Home Medications on Admission:  Current outpatient prescriptions:  .  acetaminophen (TYLENOL) 325 MG tablet, Take 2 tablets (650 mg total) by mouth every 4 (four) hours as needed for headache or mild pain., Disp: , Rfl:  .  aspirin EC 81 MG tablet, Take 1 tablet (81 mg total) by mouth daily., Disp: , Rfl:  .  canagliflozin (INVOKANA) 100 MG TABS tablet, Take 1 tablet (100 mg total) by mouth daily., Disp: 30 tablet, Rfl: 2 .  carbidopa-levodopa (SINEMET IR) 25-100 MG per tablet, Take 2 tablets by mouth 3 (three) times daily. , Disp: , Rfl:  .  carvedilol (COREG) 25 MG tablet, Take 1 tablet (25 mg total) by mouth 2 (two) times daily., Disp: 180 tablet, Rfl: 3 .  clopidogrel (PLAVIX) 75 MG tablet, Take 1 tablet (75 mg total) by mouth daily with breakfast., Disp: 90 tablet, Rfl: 1 .  cyanocobalamin (,VITAMIN B-12,) 1000 MCG/ML injection, Inject 1,000 mcg into the muscle every 30 (thirty) days. , Disp: , Rfl:  .  DULoxetine (CYMBALTA) 60 MG capsule, Take 1 capsule (60 mg total) by mouth daily. TAKE 1 CAPSULE DAILY, Disp: 90 capsule, Rfl: 1 .  gabapentin (NEURONTIN) 300 MG capsule, Take 1 capsule (300 mg total) by mouth 2 (two) times daily., Disp: 180 capsule, Rfl: 1 .  HYDROcodone-acetaminophen (NORCO/VICODIN) 5-325 MG per tablet, 1-2 po tid prn Earliest Fill Date: 08/04/15, Disp: , Rfl:  .  insulin glargine (LANTUS) 100 UNIT/ML injection, Inject 55 Units into the skin 2 (two) times daily. , Disp: , Rfl:  .  insulin regular (HUMULIN R) 100 units/mL injection, Inject 0.3-0.46 mLs (30-46 Units total) into the skin 3 (three) times daily before meals., Disp: 40 mL, Rfl: 2 .  lisinopril-hydrochlorothiazide (PRINZIDE,ZESTORETIC) 20-12.5 MG tablet, Take  1 tablet by mouth 2 (two) times daily., Disp: 180 tablet, Rfl: 1 .  metFORMIN (GLUCOPHAGE) 1000 MG tablet, Take 1,000 mg by mouth 2 (two) times daily with a meal., Disp: , Rfl:  .  nitroGLYCERIN (NITROSTAT) 0.4 MG SL tablet, Place 1 tablet (0.4 mg total) under the tongue every 5 (five) minutes as needed for chest pain., Disp: 30 tablet, Rfl: 6 .  pantoprazole (PROTONIX) 40 MG tablet, Take 1 tablet (40 mg total) by mouth 2 (two) times daily., Disp: 180 tablet, Rfl: 3 .  sertraline (ZOLOFT) 25 MG tablet, Take 1 tablet (25 mg total) by mouth at bedtime., Disp: 30 tablet, Rfl: 3 .  simvastatin (ZOCOR) 20 MG tablet, Take 1 tablet (20 mg total) by mouth at bedtime., Disp: 90 tablet, Rfl: 3  Past Medical History: Past Medical History  Diagnosis Date  . Shingles   . Hyperlipidemia   . Lower leg pain     chronic ,left leg  . Acute angina ([place])   . Depression   . CAD (coronary artery disease)     s/p multiple PCI with stent RCA,LAD and obtuse marginal,followed @ [facility] on the accord study.., cath 02/2012 90% D1, s/p drug-eluting stent Dr. Fletcher [name]  . Shortness of breath   . Hypertension     dr Jerilynn Mages Audelia [place]on     labauer in [place]  . Bell's palsy   . Left leg pain   . 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 Type II diabetes mellitus ([place])   . GERD (gastroesophageal reflux disease)   . Headache(784.0)     "sometimes daily; sometimes weekly" (05/16/2015)  . Stroke [facility]) 2015    denies residual on 05/16/2015  . Chronic lower back pain     "for 3 months now" (05/16/2015)  . Diabetic peripheral neuropathy ([facility])     "feet"  . Falls frequently     "in the last month or so" (05/16/2015)  . 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 Former Smoker -- 0.12 packs/day for 10 years  . Types: Cigarettes  . Quit date: 11/08/1971  Smokeless tobacco  . Never Used    Labs: Recent Review Flowsheet Data    Labs for ITP Cardiac and Pulmonary Rehab Latest Ref Rng  11/16/2014 02/16/2015 03/05/2015 05/06/2015 07/21/2015   Cholestrol - 189 - 94 - -   LDLCALC - - - 50 - -   LDLDIRECT - 116.0 - - - -   HDL - 38.90(L) - 30(L) - -   Trlycerides - 306.0(H) - 70 - -   Hemoglobin A1c 4.6 - 6.5 % 9.7(H) 9.1(H) 8.7(H) 9.1(H) 7.2(H)       Exercise Target Goals:    Exercise Program Goal: Individual exercise prescription set with THRR, safety & activity barriers. Participant demonstrates ability to understand and report RPE using BORG scale, to self-measure pulse accurately, and to acknowledge the importance of the exercise prescription.  Exercise Prescription Goal: Starting with aerobic activity 30 plus minutes a day, 3 days per week for initial exercise prescription. Provide home exercise prescription and guidelines that participant acknowledges understanding prior to discharge.  Activity Barriers & Risk Stratification:     Activity Barriers & Risk Stratification - 09/01/15 1731    Activity Barriers & Risk Stratification   Activity Barriers Balance Concerns;Assistive Device  Had his walker today with him like Jamason usually does.      6 Minute Walk:     6 Minute Walk      07/28/15 1519       6 Minute Walk   Phase Initial     Distance 355 feet     Walk Time 6 minutes     Resting HR 57 bpm     Resting BP 122/70 mmHg     Max Ex. HR 107 bpm     Max Ex. BP 146/70 mmHg     RPE 13     Symptoms No        Initial Exercise Prescription:     Initial Exercise Prescription - 07/28/15 1500    Date of Initial Exercise Prescription   Date 07/28/15   Treadmill   MPH 1   Grade 0   Minutes 10   Recumbant Bike   Level 2   RPM 40   Watts 20   Minutes 10   NuStep   Level 2   Watts 40   Minutes 10   Arm Ergometer   Level 1   Watts 10   Minutes 10   REL-XR   Level 2   Watts 40   Minutes 10   Prescription Details   Frequency (times per week) 3   Duration Progress to 30 minutes of continuous aerobic without signs/symptoms of physical distress    Intensity   THRR REST +  30   Ratings of Perceived Exertion 11-15   Perceived Dyspnea 2-4   Progression Continue progressive overload as  per policy without signs/symptoms or physical distress.   Resistance Training   Training Prescription Yes   Weight 2   Reps 10-15      Exercise Prescription Changes:     Exercise Prescription Changes      08/01/15 0600 08/17/15 1700 08/29/15 0600 09/19/15 1000     Exercise Review   Progression No  Has not started program yet Yes Yes Yes    Response to Exercise   Blood Pressure (Admit)   180/90 mmHg 126/68 mmHg    Blood Pressure (Exercise)   184/90 mmHg 152/68 mmHg    Blood Pressure (Exit)   160/80 mmHg 132/80 mmHg    Heart Rate (Admit)   90 bpm 82 bpm    Heart Rate (Exercise)   91 bpm 83 bpm    Heart Rate (Exit)   85 bpm 63 bpm    Rating of Perceived Exertion (Exercise)   12 13    Symptoms  None High BP the last few visits. Informed his wife who said they will consult MD. Blood pressure is looking better. Last visit BP was in acceptable range    Comments  Reviewed individualized exercise prescription and made increases per departmental policy. Exercise increases were discussed with the patient and they were able to perform the new work loads without issue (no signs or symptoms).  [name] is making increases on hjs own which is great. He is responding well to the exercise and progressing steadily on all of the equipment. [name] is progressing well and increasing in his aerobic endurance.    Duration  Progress to 30 minutes of continuous aerobic without signs/symptoms of physical distress Progress to 30 minutes of continuous aerobic without signs/symptoms of physical distress Progress to 30 minutes of continuous aerobic without signs/symptoms of physical distress    Intensity  Rest + 30 Rest + 30 Rest + 30    Progression  Continue progressive overload as per policy without signs/symptoms or physical distress. Continue progressive overload as per  policy without signs/symptoms or physical distress. Continue progressive overload as per policy without signs/symptoms or physical distress.    Resistance Training   Training Prescription  Yes Yes Yes    Weight  4 4 4     Reps  10-15 10-15 10-15    Interval Training   Interval Training  No No No    NuStep   Level  4 5 7     Watts  35 35 50    Minutes  20 25 25     Recumbant Elliptical   Level   2 2    RPM   35 35    Watts   15 15    Minutes   15 15       Discharge Exercise Prescription (Final Exercise Prescription Changes):     Exercise Prescription Changes - 09/19/15 1000    Exercise Review   Progression Yes   Response to Exercise   Blood Pressure (Admit) 126/68 mmHg   Blood Pressure (Exercise) 152/68 mmHg   Blood Pressure (Exit) 132/80 mmHg   Heart Rate (Admit) 82 bpm   Heart Rate (Exercise) 83 bpm   Heart Rate (Exit) 63 bpm   Rating of Perceived Exertion (Exercise) 13   Symptoms Blood pressure is looking better. Last visit BP was in acceptable range   Comments [name] is progressing well and increasing in his aerobic endurance.   Duration Progress to 30 minutes of continuous aerobic without signs/symptoms of physical distress   Intensity Rest +  30   Progression Continue progressive overload as per policy without signs/symptoms or physical distress.   Resistance Training   Training Prescription Yes   Weight 4   Reps 10-15   Interval Training   Interval Training No   NuStep   Level 7   Watts 50   Minutes 25   Recumbant Elliptical   Level 2   RPM 35   Watts 15   Minutes 15      Nutrition:  Target Goals: Understanding of nutrition guidelines, daily intake of sodium <1534m, cholesterol <2024m calories 30% from fat and 7% or less from saturated fats, daily to have 5 or more servings of fruits and vegetables.  Biometrics:     Pre Biometrics - 07/28/15 1523    Pre Biometrics   Height 6' (1.829 m)   Weight 247 lb 1.6 oz (112.084 kg)   Waist Circumference 46  inches   Hip Circumference 50 inches   Waist to Hip Ratio 0.92 %   BMI (Calculated) 33.6       Nutrition Therapy Plan and Nutrition Goals:     Nutrition Therapy & Goals - 08/10/15 1802    Nutrition Therapy   Diet 1700-1800kcal Diabetes diet   Drug/Food Interactions Statins/Certain Fruits   Fiber 30 grams   Whole Grain Foods 3 servings   Protein 7 ounces/day   Saturated Fats 13 max. grams   Fruits and Vegetables 8 servings/day   Personal Nutrition Goals   Personal Goal #1 Keep working to control food portions, especially starchy foods (fist-size or less)   Personal Goal #2 Eat plenty of vegetables and fruits (small-medium portions of fruits to control blood sugar)      Nutrition Discharge: Rate Your Plate Scores:     Rate Your Plate - 0995/18/8481660  Rate Your Plate Scores   Pre Score 62   Pre Score % 69 %      Nutrition Goals Re-Evaluation:     Nutrition Goals Re-Evaluation      08/24/15 1333 09/08/15 1248         Personal Goal #1 Re-Evaluation   Personal Goal #1 [place] he is doing much better with his food portions!       Personal Goal #2 Re-Evaluation   Goal Progress Seen Yes       Comments  Ea[place]tates he is following his nutrition guidelines. Recently he has had family reunion and vacation time that has created nutrition challenges.  He is back to better eating habits since the events.         Psychosocial: Target Goals: Acknowledge presence or absence of depression, maximize coping skills, provide positive support system. Participant is able to verbalize types and ability to use techniques and skills needed for reducing stress and depression.  Initial Review & Psychosocial Screening:     Initial Psych Review & Screening - 07/28/15 1912    Initial Review   Current issues with Current Sleep Concerns;Current Depression;Current Psychotropic Meds      Quality of Life Scores:     Quality of Life - 08/04/15 1734    Quality of Life Scores    Health/Function Pre 3.6 %   Socioeconomic Pre 20 %   Psych/Spiritual Pre 18 %   Family Pre 22.8 %   GLOBAL Pre 12.55 %      PHQ-9:     Recent Review Flowsheet Data    [facility] 07/28/2015 03/11/2015 12/26/2012   Decreased Interest 1 0  0   Down, Depressed, Hopeless 1 0 0   PHQ - 2 Score 2 0 0   Altered sleeping 2 - -   Tired, decreased energy 3 - -   Change in appetite 1 - -   Feeling bad or failure about yourself  0 - -   Trouble concentrating 2 - -   Moving slowly or fidgety/restless 0 - -   Suicidal thoughts 0 - -   PHQ-9 Score 10 - -   Difficult doing work/chores Not difficult at all - -      Psychosocial Evaluation and Intervention:     Psychosocial Evaluation - 08/03/15 1720    Psychosocial Evaluation & Interventions   Interventions Stress management education;Relaxation education;Encouraged to exercise with the program and follow exercise prescription   Comments Counselor met with Mr. Lappe today for initial psychosocial evaluation.  He is a 69 year old who had 6 stents inserted over the summer and has spent several months in rehabiliation for nerve damage in his leg as a result.  He has a strong support system with a spouse of 9 years and several adult children living close by as well as active involvement in his local church community.  He reports difficulty sleeping since he was taken off a sleep aid by his Dr. and his appetite has decreased since the surgery.  He reports a history of depression and anxiety and has been addressing this through medication and talk therapy.  He states his current mood is positive and he is optimistic about this program.  His goals are to improve his balance, increase his energy and stamina and lose some more weight.  Counselor encouraged him to check with his Dr. or pharmacist re: an OTC sleep aid since the Melatonin he is using is not working.  Counselor also taught deep breathing and muscle relaxation techniques and will follow  with him as needed.   Continued Psychosocial Services Needed --  Mr. Bulnes will benefit from the psychoeducational components of this program considering his history of depression and anxiety.  He also will benefit from consistent exercise and learning relaxation techniques.       Psychosocial Re-Evaluation:     Psychosocial Re-Evaluation      08/24/15 1332 08/24/15 1711         Psychosocial Re-Evaluation   Interventions Encouraged to attend Cardiac Rehabilitation for the exercise       Comments --  [place] so was happy that he was able to attend a high school football game with his walker but still able to go. with his wife.  Mr. Pickerill reported today that he is still having difficulty sleeping at night with several medication changes initiated by his doctor, without success.  He agreed to continue to work with his doctor on this and report back if any improvements.  Otherwise, he reports he is feeling "pretty well" and is experiencing benefits from this program.           Vocational Rehabilitation: Provide vocational rehab assistance to qualifying candidates.   Vocational Rehab Evaluation & Intervention:     Vocational Rehab - 07/28/15 1827    Initial Vocational Rehab Evaluation & Intervention   Assessment shows need for Vocational Rehabilitation No  Patient is retired      Education: Education Goals: Education classes will be provided on a weekly basis, covering required topics. Participant will state understanding/return demonstration of topics presented.  Learning Barriers/Preferences:     Learning Barriers/Preferences - 07/28/15 1827  Learning Barriers/Preferences   Learning Barriers Hearing   Learning Preferences Group Instruction      Education Topics: General Nutrition Guidelines/Fats and Fiber: -Group instruction provided by verbal, written material, models and posters to present the general guidelines for heart healthy nutrition. Gives an explanation and  review of dietary fats and fiber.          Cardiac Rehab from 09/26/2015 in [facility] Cardiac Rehab   Date  08/29/15   Educator  PI   Instruction Review Code  2- meets goals/outcomes      Controlling Sodium/Reading Food Labels: -Group verbal and written material supporting the discussion of sodium use in heart healthy nutrition. Review and explanation with models, verbal and written materials for utilization of the food label.      Cardiac Rehab from 09/26/2015 in [facility] Cardiac Rehab   Date  09/05/15   Educator  PI   Instruction Review Code  2- meets goals/outcomes      Exercise Physiology & Risk Factors: - Group verbal and written instruction with models to review the exercise physiology of the cardiovascular system and associated critical values. Details cardiovascular disease risk factors and the goals associated with each risk factor.      Cardiac Rehab from 09/26/2015 in [facility] Cardiac Rehab   Date  09/21/15   Educator  RM   Instruction Review Code  2- meets goals/outcomes      Aerobic Exercise & Resistance Training: - Gives group verbal and written discussion on the health impact of inactivity. On the components of aerobic and resistive training programs and the benefits of this training and how to safely progress through these programs.      Cardiac Rehab from 09/26/2015 in [facility] Cardiac Rehab   Date  09/26/15   Educator  SW   Instruction Review Code  2- meets goals/outcomes      Flexibility, Balance, General Exercise Guidelines: - Provides group verbal and written instruction on the benefits of flexibility and balance training programs. Provides general exercise guidelines with specific guidelines to those with heart or lung disease. Demonstration and skill practice provided.      Cardiac Rehab from 09/26/2015 in [facility] Cardiac Rehab   Date  08/08/15   Educator  SW   Instruction Review Code  2- meets goals/outcomes      Stress Management: - Provides group verbal and written  instruction about the health risks of elevated stress, cause of high stress, and healthy ways to reduce stress.      Cardiac Rehab from 09/26/2015 in [facility] Cardiac Rehab   Date  08/10/15   Educator  Kathreen Cornfield   Instruction Review Code  2- meets goals/outcomes      Depression: - Provides group verbal and written instruction on the correlation between heart/lung disease and depressed mood, treatment options, and the stigmas associated with seeking treatment.   Anatomy & Physiology of the Heart: - Group verbal and written instruction and models provide basic cardiac anatomy and physiology, with the coronary electrical and arterial systems. Review of: AMI, Angina, Valve disease, Heart Failure, Cardiac Arrhythmia, Pacemakers, and the ICD.   Cardiac Procedures: - Group verbal and written instruction and models to describe the testing methods done to diagnose heart disease. Reviews the outcomes of the test results. Describes the treatment choices: Medical Management, Angioplasty, or Coronary Bypass Surgery.      Cardiac Rehab from 09/26/2015 in [facility] Cardiac Rehab   Date  09/12/15   Educator  SB   Instruction Review Code  2- meets goals/outcomes      Cardiac Medications: - Group verbal and written instruction to review commonly prescribed medications for heart disease. Reviews the medication, class of the drug, and side effects. Includes the steps to properly store meds and maintain the prescription regimen.      Cardiac Rehab from 09/26/2015 in [facility] Cardiac Rehab   Date  09/19/15   Educator  SB   Instruction Review Code  2- meets goals/outcomes      Go Sex-Intimacy & Heart Disease, Get SMART - Goal Setting: - Group verbal and written instruction through game format to discuss heart disease and the return to sexual intimacy. Provides group verbal and written material to discuss and apply goal setting through the application of the S.M.A.R.T. Method.      Cardiac Rehab from 09/26/2015 in [facility]  Cardiac Rehab   Date  09/12/15   Educator  SB   Instruction Review Code  2- meets goals/outcomes      Other Matters of the Heart: - Provides group verbal, written materials and models to describe Heart Failure, Angina, Valve Disease, and Diabetes in the realm of heart disease. Includes description of the disease process and treatment options available to the cardiac patient.      Cardiac Rehab from 09/26/2015 in [facility] Cardiac Rehab   Date  08/24/15   Educator  DW   Instruction Review Code  2- meets goals/outcomes      Exercise & Equipment Safety: - Individual verbal instruction and demonstration of equipment use and safety with use of the equipment.      Cardiac Rehab from 09/26/2015 in [facility] Cardiac Rehab   Date  07/28/15   Educator  DW   Instruction Review Code  1- partially meets, needs review/practice      Infection Prevention: - Provides verbal and written material to individual with discussion of infection control including proper hand washing and proper equipment cleaning during exercise session.      Cardiac Rehab from 09/26/2015 in [facility] Cardiac Rehab   Date  07/28/15   Educator  DW   Instruction Review Code  2- meets goals/outcomes      Falls Prevention: - Provides verbal and written material to individual with discussion of falls prevention and safety.      Cardiac Rehab from 09/26/2015 in [facility] Cardiac Rehab   Date  07/28/15   Educator  DW   Instruction Review Code  1- partially meets, needs review/practice      Diabetes: - Individual verbal and written instruction to review signs/symptoms of diabetes, desired ranges of glucose level fasting, after meals and with exercise. Advice that pre and post exercise glucose checks will be done for 3 sessions at entry of program.      Cardiac Rehab from 09/26/2015 in [facility] Cardiac Rehab   Date  07/28/15   Educator  DW   Instruction Review Code  2- meets goals/outcomes       Knowledge Questionnaire Score:     Knowledge  Questionnaire Score - 08/04/15 1733    Knowledge Questionnaire Score   Pre Score 24      Personal Goals and Risk Factors at Admission:     Personal Goals and Risk Factors at Admission - 07/28/15 1829    Personal Goals and Risk Factors on Admission    Weight Management Yes   Intervention Learn and follow the exercise and diet guidelines while in the program. Utilize the nutrition and education classes to help gain knowledge of the diet  and exercise expectations in the program   Admit Weight 247 lb 1.6 oz (112.084 kg)   Goal Weight 195 lb (88.451 kg)   Increase Aerobic Exercise and Physical Activity Yes   Intervention While in program, learn and follow the exercise prescription taught. Start at a low level workload and increase workload after able to maintain previous level for 30 minutes. Increase time before increasing intensity.   Take Less Medication Yes   Intervention Learn your risk factors and begin the lifestyle modifications for risk factor control during your time in the program.   Understand more about Heart/Pulmonary Disease. Yes   Intervention While in program utilize professionals for any questions, and attend the education sessions. Great websites to use are www.americanheart.org or www.lung.org for reliable information.   Diabetes Yes   Goal Blood glucose control identified by blood glucose values, HgbA1C. Participant verbalizes understanding of the signs/symptoms of hyper/hypo glycemia, proper foot care and importance of medication and nutrition plan for blood glucose control.   Intervention Provide nutrition & aerobic exercise along with prescribed medications to achieve blood glucose in normal ranges: Fasting 65-99 mg/dL   Hypertension Yes   Goal Participant will see blood pressure controlled within the values of 140/49m/Hg or within value directed by their physician.   Intervention Provide nutrition & aerobic exercise along with prescribed medications to achieve BP 140/90  or less.   Lipids Yes   Goal Cholesterol controlled with medications as prescribed, with individualized exercise RX and with personalized nutrition plan. Value goals: LDL < 76m HDL > 4020mParticipant states understanding of desired cholesterol values and following prescriptions.   Intervention Provide nutrition & aerobic exercise along with prescribed medications to achieve LDL <20m27mDL >40mg61mStress No   Personal Goal Other Yes   Personal Goal Improve balance   Intervention Other  Throughout program increase endurance, overall conditioning, strength and flexibility.        Personal Goals and Risk Factors Review:      Goals and Risk Factor Review      08/08/15 1752 08/24/15 1330 09/01/15 1735 09/08/15 1250     Weight Management   Goals Progress/Improvement seen    No    Comments    Breckan Stepanurrently up 5 pounds.  He atteributes this to vacation indulgences and family reunion meals!  He is currently returned to his better eating habits and expects to see the weight return to pre vacation level. He is eating less snacks, no bedtime snack. Less frequent AM snacks and is working on portion control during his meals.     Increase Aerobic Exercise and Physical Activity   Goals Progress/Improvement seen  Yes Yes No Yes    Comments Was able to increase duration of exercise to 33 min on the NS. Patient stated that his legs felt stronger and he is starting to feel  better.  BrittVevelyn Royals he is able to do things that he hasn't been able to do lately like go to a high school football game.  Quavis Izaia Say to the Bathroom with his walker and stated he fell back on the commode. Luz Dironrted no injury and was able to walk with no problems after. Travell Anival Pasharts that he falls at home alot. Does NOT use the treadmill her but only uses the sit down Nustep equipment.  Kerim Terrionxercising at a pool 45 min for 2 days a week in addition to his CR classes. He is walking at home some.  Take Less Medication    Goals Progress/Improvement seen    No    Comments    No changes yet.  Kaidon does not anticipate changes soon.  He is aware that continued effort with the lifestyle changes can decrease his need for some of his medications.    Diabetes   Goal  --  Blood sugars are more stable. He is eating smaller portions of food.       Hypertension   Goal  --  Jerick's blood pressure has been well controlled-see LSI sheets.      Progress seen toward goals    Yes    Comments    Derrien's BP is controlled with his medication at present/  He is continuiong to work on Lifestyle changes to eliminate the need for medication while maintaining BP control.    Abnormal Lipids   Goal  --  Hesston continues to take his cholestrol medicine.       Progress seen towards goals    Yes    Comments    Fabio is working on nutrition guidelines and his exercise progression to work on cholesterol control. No recent labs to see effect of his changes.        Personal Goals Discharge (Final Personal Goals and Risk Factors Review):      Goals and Risk Factor Review - 09/08/15 1250    Weight Management   Goals Progress/Improvement seen No   Comments Shed is currently up 5 pounds.  He atteributes this to vacation indulgences and family reunion meals!  He is currently returned to his better eating habits and expects to see the weight return to pre vacation level. He is eating less snacks, no bedtime snack. Less frequent AM snacks and is working on portion control during his meals.    Increase Aerobic Exercise and Physical Activity   Goals Progress/Improvement seen  Yes   Comments Winfred is exercising at a pool 45 min for 2 days a week in addition to his CR classes. He is walking at home some.    Take Less Medication   Goals Progress/Improvement seen No   Comments No changes yet.  Advait does not anticipate changes soon.  He is aware that continued effort with the lifestyle changes can decrease his need for some of his medications.    Hypertension   Progress seen toward goals Yes   Comments Daneil's BP is controlled with his medication at present/  He is continuiong to work on Lifestyle changes to eliminate the need for medication while maintaining BP control.   Abnormal Lipids   Progress seen towards goals Yes   Comments Homar is working on nutrition guidelines and his exercise progression to work on cholesterol control. No recent labs to see effect of his changes.        Comments: [name] is still using his walker but his strength is much better now that he has been able to attend and exercise in Cardiac Rehab.

## 2015-09-26 NOTE — Progress Notes (Signed)
Daily Session Note  Patient Details  Name: Steve Phillips MRN: [id number] Date of Birth: 1946/07/25 Referring Provider:  [name], MD  Encounter Date: 09/26/2015  Check In:     Session Check In - 09/26/15 1608    Check-In   Staff Present Gerlene Burdock RN, BSN;Susanne Bice RN, BSN, CCRP;Steven Way BS, ACSM EP-C, Exercise Physiologist   ER physicians immediately available to respond to emergencies See telemetry face sheet for immediately available ER MD   Medication changes reported     No   Fall or balance concerns reported    No   Warm-up and Cool-down Performed on first and last piece of equipment   VAD Patient? No   Pain Assessment   Currently in Pain? No/denies         Goals Met:  Proper associated with RPD/PD & O2 Sat Exercise tolerated well No report of cardiac concerns or symptoms  Goals Unmet:  Not Applicable  Goals Comments: Steve Phillips is still using his walker but his strength is much better now that he has been able to attend and exercise in Cardiac Rehab.    Dr. Emily Filbert is Medical Director for [place] and [facility].

## 2015-09-28 ENCOUNTER — Other Ambulatory Visit: Payer: Self-pay | Admitting: *Deleted

## 2015-09-28 ENCOUNTER — Encounter: Payer: Medicare Other | Admitting: *Deleted

## 2015-09-28 DIAGNOSIS — Z955 Presence of coronary angioplasty implant and graft: Secondary | ICD-10-CM

## 2015-09-28 DIAGNOSIS — I2510 Atherosclerotic heart disease of native coronary artery without angina pectoris: Secondary | ICD-10-CM | POA: Diagnosis not present

## 2015-09-28 DIAGNOSIS — Z9861 Coronary angioplasty status: Secondary | ICD-10-CM | POA: Diagnosis not present

## 2015-09-28 NOTE — Progress Notes (Signed)
Daily Session Note  Patient Details  Name: Steve Phillips MRN: [id number] Date of Birth: Dec 01, 1945 Referring Provider:  [name], MD  Encounter Date: 09/28/2015  Check In:     Session Check In - 09/28/15 1615    Check-In   Staff Present Gerlene Burdock RN, BSN;Renee Dillard [place] MS, ACSM CEP Exercise Physiologist;Diane Mariana Arn, BSN   ER physicians immediately available to respond to emergencies See telemetry face sheet for immediately available ER MD   Medication changes reported     No   Fall or balance concerns reported    No   Warm-up and Cool-down Performed on first and last piece of equipment   VAD Patient? No   Pain Assessment   Currently in Pain? No/denies         Goals Met:  Proper associated with RPD/PD & O2 Sat Exercise tolerated well  Goals Unmet:  Not Applicable  Goals Comments:    Dr. Emily Filbert is Medical Director for [facility] and [facility].

## 2015-09-29 VITALS — Ht 72.0 in | Wt 245.0 lb

## 2015-09-29 DIAGNOSIS — Z9861 Coronary angioplasty status: Secondary | ICD-10-CM | POA: Diagnosis not present

## 2015-09-29 DIAGNOSIS — I2510 Atherosclerotic heart disease of native coronary artery without angina pectoris: Secondary | ICD-10-CM

## 2015-09-29 DIAGNOSIS — M7541 Impingement syndrome of right shoulder: Secondary | ICD-10-CM | POA: Diagnosis not present

## 2015-09-29 DIAGNOSIS — Z955 Presence of coronary angioplasty implant and graft: Secondary | ICD-10-CM | POA: Diagnosis not present

## 2015-09-29 DIAGNOSIS — M5136 Other intervertebral disc degeneration, lumbar region: Secondary | ICD-10-CM | POA: Diagnosis not present

## 2015-09-29 DIAGNOSIS — M5416 Radiculopathy, lumbar region: Secondary | ICD-10-CM | POA: Diagnosis not present

## 2015-09-29 NOTE — Progress Notes (Signed)
Daily Session Note  Patient Details  Name: Steve Phillips MRN: [id number] Date of Birth: March 12, 1946 Referring Provider:  [name], MD  Encounter Date: 09/29/2015  Check In:     Session Check In - 09/29/15 1637    Check-In   Staff Present Lestine Box BS, ACSM EP-C, Exercise Physiologist;Carroll Enterkin RN, BSN;Diane Joya Gaskins RN, BSN   ER physicians immediately available to respond to emergencies See telemetry face sheet for immediately available ER MD   Medication changes reported     No   Fall or balance concerns reported    No   Warm-up and Cool-down Performed on first and last piece of equipment   VAD Patient? No   Pain Assessment   Currently in Pain? No/denies           Exercise Prescription Changes - 09/29/15 1600    Exercise Review   Progression Yes   Response to Exercise   Duration Progress to 30 minutes of continuous aerobic without signs/symptoms of physical distress   Intensity Rest + 30   Progression Continue progressive overload as per policy without signs/symptoms or physical distress.   Resistance Training   Training Prescription Yes   Weight 4   Reps 10-15   Interval Training   Interval Training No   NuStep   Level 7   Watts 50   Minutes 25   Recumbant Elliptical   Level 2   RPM 35   Watts 15   Minutes 15   Home Exercise Plan   Plans to continue exercise at [employer] (comment)  [place] mill[place]      Goals Met:  Proper associated with RPD/PD & O2 Sat Exercise tolerated well No report of cardiac concerns or symptoms Strength training completed today  Goals Unmet:  Not Applicable  Goals Comments:    Dr. Emily Filbert is Medical Director for [facility] and [facility].

## 2015-10-03 ENCOUNTER — Telehealth: Payer: Self-pay

## 2015-10-03 NOTE — Telephone Encounter (Signed)
Both Steve Phillips and his wife are sick, will not be attending this evening.  Will return when able.

## 2015-10-04 DIAGNOSIS — G214 Vascular parkinsonism: Secondary | ICD-10-CM | POA: Diagnosis not present

## 2015-10-05 ENCOUNTER — Encounter: Payer: Medicare Other | Admitting: *Deleted

## 2015-10-05 DIAGNOSIS — I2510 Atherosclerotic heart disease of native coronary artery without angina pectoris: Secondary | ICD-10-CM

## 2015-10-05 DIAGNOSIS — Z9861 Coronary angioplasty status: Principal | ICD-10-CM

## 2015-10-05 DIAGNOSIS — Z955 Presence of coronary angioplasty implant and graft: Secondary | ICD-10-CM | POA: Diagnosis not present

## 2015-10-05 NOTE — Progress Notes (Signed)
Discharge Summary  Patient Details  Name: Steve Phillips MRN: [id number] Date of Birth: [birth year] Referring Provider:  [name], MD   Number of Visits: 35 soon to complete 62  Reason for Discharge:  Patient reached a stable level of exercise. Patient independent in their exercise.  Smoking History:  History  Smoking status  . Former Smoker -- 0.12 packs/day for 10 years  . Types: Cigarettes  . Quit date: 11/08/1971  Smokeless tobacco  . Never Used    Diagnosis:  CAD S/P percutaneous coronary angioplasty  ADL Steve Phillips:   Initial Exercise Prescription:     Initial Exercise Prescription - 07/28/15 1500    Date of Initial Exercise Prescription   Date 07/28/15   Treadmill   MPH 1   Grade 0   Minutes 10   Recumbant Bike   Level 2   RPM 40   Watts 20   Minutes 10   NuStep   Level 2   Watts 40   Minutes 10   Arm Ergometer   Level 1   Watts 10   Minutes 10   REL-XR   Level 2   Watts 40   Minutes 10   Prescription Details   Frequency (times per week) 3   Duration Progress to 30 minutes of continuous aerobic without signs/symptoms of physical distress   Intensity   THRR REST +  30   Ratings of Perceived Exertion 11-15   Perceived Dyspnea 2-4   Progression Continue progressive overload as per policy without signs/symptoms or physical distress.   Resistance Training   Training Prescription Yes   Weight 2   Reps 10-15      Discharge Exercise Prescription (Final Exercise Prescription Changes):     Exercise Prescription Changes - 09/29/15 1600    Exercise Review   Progression Yes   Response to Exercise   Duration Progress to 30 minutes of continuous aerobic without signs/symptoms of physical distress   Intensity Rest + 30   Progression Continue progressive overload as per policy without signs/symptoms or physical distress.   Resistance Training   Training Prescription Yes   Weight 4   Reps 10-15   Interval Training   Interval Training No   NuStep   Level 7   Watts 50   Minutes 25   Recumbant Elliptical   Level 2   RPM 35   Watts 15   Minutes 15   Home Exercise Plan   Plans to continue exercise at Steve Phillips (comment)  New millinium [place] Steve Phillips      Functional Capacity:     6 Minute Walk      07/28/15 1519 09/29/15 1637     6 Minute Walk   Phase Initial Discharge    Distance 355 feet 410 feet    Walk Time 6 minutes 6 minutes    Resting HR 57 bpm 87 bpm    Resting BP 122/70 mmHg 136/72 mmHg    Max Ex. HR 107 bpm 103 bpm    Max Ex. BP 146/70 mmHg 202/74 mmHg    RPE 13 16    Symptoms No No       Psychological, QOL, Others - Outcomes: PHQ 2/9: Depression screen Steve Phillips 2/9 07/28/2015 03/11/2015 12/26/2012  Decreased Interest 1 0 0  Down, Depressed, Hopeless 1 0 0  PHQ - 2 Score 2 0 0  Altered sleeping 2 - -  Tired, decreased energy 3 - -  Change in appetite 1 - -  Feeling bad or failure about yourself  0 - -  Trouble concentrating 2 - -  Moving slowly or fidgety/restless 0 - -  Suicidal thoughts 0 - -  PHQ-9 Score 10 - -  Difficult doing work/chores Not difficult at all - -    Quality of Life:     Quality of Life - 08/04/15 1734    Quality of Life Scores   Health/Function Pre 3.6 %   Socioeconomic Pre 20 %   Psych/Spiritual Pre 18 %   Family Pre 22.8 %   GLOBAL Pre 12.55 %      Personal Goals: Goals established at orientation with interventions provided to work toward goal.     Personal Goals and Risk Factors at Admission - 07/28/15 1829    Personal Goals and Risk Factors on Admission    Weight Management Yes   Intervention Learn and follow the exercise and diet guidelines while in the program. Utilize the nutrition and education classes to help gain knowledge of the diet and exercise expectations in the program   Admit Weight 247 lb 1.6 oz (112.084 kg)   Goal Weight 195 lb (88.451 kg)   Increase Aerobic Exercise and Physical Activity Yes   Intervention While in program, learn and  follow the exercise prescription taught. Start at a low level workload and increase workload after able to maintain previous level for 30 minutes. Increase time before increasing intensity.   Take Less Medication Yes   Intervention Learn your risk factors and begin the lifestyle modifications for risk factor control during your time in the program.   Understand more about Heart/Pulmonary Disease. Yes   Intervention While in program utilize professionals for any questions, and attend the education sessions. Great websites to use are www.americanheart.org or www.lung.org for reliable information.   Diabetes Yes   Goal Blood glucose control identified by blood glucose values, HgbA1C. Participant verbalizes understanding of the signs/symptoms of hyper/hypo glycemia, proper foot care and importance of medication and nutrition plan for blood glucose control.   Intervention Provide nutrition & aerobic exercise along with prescribed medications to achieve blood glucose in normal ranges: Fasting 65-99 mg/dL   Hypertension Yes   Goal Participant will see blood pressure controlled within the values of 140/18mm/Hg or within value directed by their physician.   Intervention Provide nutrition & aerobic exercise along with prescribed medications to achieve BP 140/90 or less.   Lipids Yes   Goal Cholesterol controlled with medications as prescribed, with individualized exercise RX and with personalized nutrition plan. Value goals: LDL < 70mg , HDL > 40mg . Participant states understanding of desired cholesterol values and following prescriptions.   Intervention Provide nutrition & aerobic exercise along with prescribed medications to achieve LDL 70mg , HDL >40mg .   Stress No   Personal Goal Other Yes   Personal Goal Improve balance   Intervention Other  Throughout program increase endurance, overall conditioning, strength and flexibility.         Personal Goals Discharge:     Goals and Risk Factor Review       08/08/15 1752 08/24/15 1330 09/01/15 1735 09/08/15 1250 09/26/15 1611   Weight Management   Goals Progress/Improvement seen    No    Comments    [name] is currently up 5 pounds.  He atteributes this to vacation indulgences and family reunion meals!  He is currently returned to his better eating habits and expects to see the weight return to pre vacation level. He is eating less snacks, no  bedtime snack. Less frequent AM snacks and is working on portion control during his meals.     Increase Aerobic Exercise and Physical Activity   Goals Progress/Improvement seen  Yes Yes No Yes Yes   Comments Was able to increase duration of exercise to 33 min on the NS. Patient stated that his legs felt stronger and he is starting to feel  better.  [name] said he is able to do things that he hasn't been able to do lately like go to a high school football game.  Nevyn Garg went to the Bathroom with his walker and stated he fell back on the commode. Trashon reported no injury and was able to walk with no problems after. Yoseph Buchanan reports that he falls at home alot. Does NOT use the treadmill her but only uses the sit down Nustep equipment.  Yeicob is exercising at a pool 45 min for 2 days a week in addition to his CR classes. He is walking at home some.  Britt walked on the treadmill with the exercise specialist Lestine Box at his side last week (had a PT belt on).    Take Less Medication   Goals Progress/Improvement seen    No No   Comments    No changes yet.  Marico does not anticipate changes soon.  He is aware that continued effort with the lifestyle changes can decrease his need for some of his medications.    Diabetes   Goal  --  Blood sugars are more stable. He is eating smaller portions of food.       Hypertension   Goal  --  Damoney's blood pressure has been well controlled-see LSI sheets.      Progress seen toward goals    Yes Yes  Blood pressure is good.   Comments    Corbyn's BP is controlled with his medication at  present/  He is continuiong to work on Lifestyle changes to eliminate the need for medication while maintaining BP control.    Abnormal Lipids   Goal  --  Orla continues to take his cholestrol medicine.       Progress seen towards goals    Yes    Comments    Hakop is working on nutrition guidelines and his exercise progression to work on cholesterol control. No recent labs to see effect of his changes.        Nutrition & Weight - Outcomes:     Pre Biometrics - 07/28/15 1523    Pre Biometrics   Height 6' (1.829 m)   Weight 247 lb 1.6 oz (112.084 kg)   Waist Circumference 46 inches   Hip Circumference 50 inches   Waist to Hip Ratio 0.92 %   BMI (Calculated) 33.6         Post Biometrics - 09/29/15 1710     Post  Biometrics   Height 6' (1.829 m)   Weight 245 lb (111.131 kg)   Waist Circumference 46.5 inches   Hip Circumference 46 inches   Waist to Hip Ratio 1.01 %   BMI (Calculated) 33.3      Nutrition:     Nutrition Therapy & Goals - 08/10/15 1802    Nutrition Therapy   Diet 1700-1800kcal Diabetes diet   Drug/Food Interactions Statins/Certain Fruits   Fiber 30 grams   Whole Grain Foods 3 servings   Protein 7 ounces/day   Saturated Fats 13 max. grams   Fruits and Vegetables 8 servings/day   Personal Nutrition  Goals   Personal Goal #1 Keep working to control food portions, especially starchy foods (fist-size or less)   Personal Goal #2 Eat plenty of vegetables and fruits (small-medium portions of fruits to control blood sugar)      Nutrition Discharge:     Rate Your Plate - [id number]    Rate Your Plate Scores   Pre Score 62   Pre Score % 69 %      Education Questionnaire Score:     Knowledge Questionnaire Score - 08/04/15 1733    Knowledge Questionnaire Score   Pre Score 24      Goals reviewed with patient; copy given to patient.

## 2015-10-05 NOTE — Progress Notes (Signed)
Cardiac Individual Treatment Plan  Patient Details  Name: Steve Phillips MRN: [id number] Date of Birth: Dec 05, 1945 Referring Provider:  [name], MD  Initial Encounter Date:  07/28/2015  Visit Diagnosis: CAD S/P percutaneous coronary angioplasty  Patient's Home Medications on Admission:  Current outpatient prescriptions:  .  acetaminophen (TYLENOL) 325 MG tablet, Take 2 tablets (650 mg total) by mouth every 4 (four) hours as needed for headache or mild pain., Disp: , Rfl:  .  aspirin EC 81 MG tablet, Take 1 tablet (81 mg total) by mouth daily., Disp: , Rfl:  .  canagliflozin (INVOKANA) 100 MG TABS tablet, Take 1 tablet (100 mg total) by mouth daily., Disp: 30 tablet, Rfl: 2 .  carbidopa-levodopa (SINEMET IR) 25-100 MG per tablet, Take 2 tablets by mouth 3 (three) times daily. , Disp: , Rfl:  .  carvedilol (COREG) 25 MG tablet, Take 1 tablet (25 mg total) by mouth 2 (two) times daily., Disp: 180 tablet, Rfl: 3 .  clopidogrel (PLAVIX) 75 MG tablet, Take 1 tablet (75 mg total) by mouth daily with breakfast., Disp: 90 tablet, Rfl: 1 .  cyanocobalamin (,VITAMIN B-12,) 1000 MCG/ML injection, Inject 1,000 mcg into the muscle every 30 (thirty) days. , Disp: , Rfl:  .  DULoxetine (CYMBALTA) 60 MG capsule, Take 1 capsule (60 mg total) by mouth daily. TAKE 1 CAPSULE DAILY, Disp: 90 capsule, Rfl: 1 .  gabapentin (NEURONTIN) 300 MG capsule, Take 1 capsule (300 mg total) by mouth 2 (two) times daily., Disp: 180 capsule, Rfl: 1 .  HYDROcodone-acetaminophen (NORCO/VICODIN) 5-325 MG per tablet, 1-2 po tid prn Earliest Fill Date: 08/04/15, Disp: , Rfl:  .  insulin glargine (LANTUS) 100 UNIT/ML injection, Inject 55 Units into the skin 2 (two) times daily. , Disp: , Rfl:  .  insulin regular (HUMULIN R) 100 units/mL injection, Inject 0.3-0.46 mLs (30-46 Units total) into the skin 3 (three) times daily before meals., Disp: 40 mL, Rfl: 2 .  lisinopril-hydrochlorothiazide (PRINZIDE,ZESTORETIC) 20-12.5 MG  tablet, Take 1 tablet by mouth 2 (two) times daily., Disp: 180 tablet, Rfl: 1 .  metFORMIN (GLUCOPHAGE) 1000 MG tablet, Take 1,000 mg by mouth 2 (two) times daily with a meal., Disp: , Rfl:  .  nitroGLYCERIN (NITROSTAT) 0.4 MG SL tablet, Place 1 tablet (0.4 mg total) under the tongue every 5 (five) minutes as needed for chest pain., Disp: 30 tablet, Rfl: 6 .  pantoprazole (PROTONIX) 40 MG tablet, Take 1 tablet (40 mg total) by mouth 2 (two) times daily., Disp: 180 tablet, Rfl: 3 .  sertraline (ZOLOFT) 25 MG tablet, Take 1 tablet (25 mg total) by mouth at bedtime., Disp: 30 tablet, Rfl: 3 .  simvastatin (ZOCOR) 20 MG tablet, Take 1 tablet (20 mg total) by mouth at bedtime., Disp: 90 tablet, Rfl: 3  Past Medical History: Past Medical History  Diagnosis Date  . Shingles   . Hyperlipidemia   . Lower leg pain     chronic ,left leg  . Acute angina ([place])   . Depression   . CAD (coronary artery disease)     s/p multiple PCI with stent RCA,LAD and obtuse marginal,followed @ [facility] on the accord study.., cath 02/2012 90% D1, s/p drug-eluting stent Dr. Fletcher [name]  . Shortness of breath   . Hypertension     dr Jerilynn Mages Audelia Acton     [facility] in [place]  . Bell's palsy   . Left leg pain   . 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  Type II diabetes mellitus ([place])   . GERD (gastroesophageal reflux disease)   . Headache(784.0)     "sometimes daily; sometimes weekly" (05/16/2015)  . Stroke [facility] 2015    denies residual on 05/16/2015  . Chronic lower back pain     "for 3 months now" (05/16/2015)  . Diabetic peripheral neuropathy ([facility])     "feet"  . Falls frequently     "in the last month or so" (05/16/2015)  . 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 Former Smoker -- 0.12 packs/day for 10 years  . Types: Cigarettes  . Quit date: 11/08/1971  Smokeless tobacco  . Never Used    Labs: Recent Review Flowsheet Data    Labs for ITP Cardiac and Pulmonary Rehab  Latest Ref Rng 11/16/2014 02/16/2015 03/05/2015 05/06/2015 07/21/2015   Cholestrol - 189 - 94 - -   LDLCALC - - - 50 - -   LDLDIRECT - 116.0 - - - -   HDL - 38.90(L) - 30(L) - -   Trlycerides - 306.0(H) - 70 - -   Hemoglobin A1c 4.6 - 6.5 % 9.7(H) 9.1(H) 8.7(H) 9.1(H) 7.2(H)       Exercise Target Goals:    Exercise Program Goal: Individual exercise prescription set with THRR, safety & activity barriers. Participant demonstrates ability to understand and report RPE using BORG scale, to self-measure pulse accurately, and to acknowledge the importance of the exercise prescription.  Exercise Prescription Goal: Starting with aerobic activity 30 plus minutes a day, 3 days per week for initial exercise prescription. Provide home exercise prescription and guidelines that participant acknowledges understanding prior to discharge.  Activity Barriers & Risk Stratification:     Activity Barriers & Risk Stratification - 09/01/15 1731    Activity Barriers & Risk Stratification   Activity Barriers Balance Concerns;Assistive Device  Had his walker today with him like Sartaj usually does.      6 Minute Walk:     6 Minute Walk      07/28/15 1519 09/29/15 1637     6 Minute Walk   Phase Initial Discharge    Distance 355 feet 410 feet    Walk Time 6 minutes 6 minutes    Resting HR 57 bpm 87 bpm    Resting BP 122/70 mmHg 136/72 mmHg    Max Ex. HR 107 bpm 103 bpm    Max Ex. BP 146/70 mmHg 202/74 mmHg    RPE 13 16    Symptoms No No       Initial Exercise Prescription:     Initial Exercise Prescription - 07/28/15 1500    Date of Initial Exercise Prescription   Date 07/28/15   Treadmill   MPH 1   Grade 0   Minutes 10   Recumbant Bike   Level 2   RPM 40   Watts 20   Minutes 10   NuStep   Level 2   Watts 40   Minutes 10   Arm Ergometer   Level 1   Watts 10   Minutes 10   REL-XR   Level 2   Watts 40   Minutes 10   Prescription Details   Frequency (times per week) 3    Duration Progress to 30 minutes of continuous aerobic without signs/symptoms of physical distress   Intensity   THRR REST +  30   Ratings of Perceived Exertion 11-15   Perceived Dyspnea 2-4   Progression Continue  progressive overload as per policy without signs/symptoms or physical distress.   Resistance Training   Training Prescription Yes   Weight 2   Reps 10-15      Exercise Prescription Changes:     Exercise Prescription Changes      08/01/15 0600 08/17/15 1700 08/29/15 0600 09/19/15 1000 09/29/15 1600   Exercise Review   Progression No  Has not started program yet Yes Yes Yes Yes   Response to Exercise   Blood Pressure (Admit)   180/90 mmHg 126/68 mmHg    Blood Pressure (Exercise)   184/90 mmHg 152/68 mmHg    Blood Pressure (Exit)   160/80 mmHg 132/80 mmHg    Heart Rate (Admit)   90 bpm 82 bpm    Heart Rate (Exercise)   91 bpm 83 bpm    Heart Rate (Exit)   85 bpm 63 bpm    Rating of Perceived Exertion (Exercise)   12 13    Symptoms  None High BP the last few visits. Informed his wife who said they will consult MD. Blood pressure is looking better. Last visit BP was in acceptable range    Comments  Reviewed individualized exercise prescription and made increases per departmental policy. Exercise increases were discussed with the patient and they were able to perform the new work loads without issue (no signs or symptoms).  [name] is making increases on hjs own which is great. He is responding well to the exercise and progressing steadily on all of the equipment. [name] is progressing well and increasing in his aerobic endurance.    Duration  Progress to 30 minutes of continuous aerobic without signs/symptoms of physical distress Progress to 30 minutes of continuous aerobic without signs/symptoms of physical distress Progress to 30 minutes of continuous aerobic without signs/symptoms of physical distress Progress to 30 minutes of continuous aerobic without signs/symptoms of physical  distress   Intensity  Rest + 30 Rest + 30 Rest + 30 Rest + 30   Progression  Continue progressive overload as per policy without signs/symptoms or physical distress. Continue progressive overload as per policy without signs/symptoms or physical distress. Continue progressive overload as per policy without signs/symptoms or physical distress. Continue progressive overload as per policy without signs/symptoms or physical distress.   Resistance Training   Training Prescription  Yes Yes Yes Yes   Weight  4 4 4 4    Reps  10-15 10-15 10-15 10-15   Interval Training   Interval Training  No No No No   NuStep   Level  4 5 7 7    Watts  35 35 50 50   Minutes  20 25 25 25    Recumbant Elliptical   Level   2 2 2    RPM   35 35 35   Watts   15 15 15    Minutes   15 15 15    Home Exercise Plan   Plans to continue exercise at     [employer] (comment)  [employer] [place]       Discharge Exercise Prescription (Final Exercise Prescription Changes):     Exercise Prescription Changes - 09/29/15 1600    Exercise Review   Progression Yes   Response to Exercise   Duration Progress to 30 minutes of continuous aerobic without signs/symptoms of physical distress   Intensity Rest + 30   Progression Continue progressive overload as per policy without signs/symptoms or physical distress.   Resistance Training   Training Prescription Yes   Weight  4   Reps 10-15   Interval Training   Interval Training No   NuStep   Level 7   Watts 50   Minutes 25   Recumbant Elliptical   Level 2   RPM 35   Watts 15   Minutes 15   Home Exercise Plan   Plans to continue exercise at [employer] (comment)  [employer] [place]      Nutrition:  Target Goals: Understanding of nutrition guidelines, daily intake of sodium <158m, cholesterol <2034m calories 30% from fat and 7% or less from saturated fats, daily to have 5 or more servings of fruits and vegetables.  Biometrics:     Pre  Biometrics - 07/28/15 1523    Pre Biometrics   Height 6' (1.829 m)   Weight 247 lb 1.6 oz (112.084 kg)   Waist Circumference 46 inches   Hip Circumference 50 inches   Waist to Hip Ratio 0.92 %   BMI (Calculated) 33.6         Post Biometrics - 09/29/15 1710     Post  Biometrics   Height 6' (1.829 m)   Weight 245 lb (111.131 kg)   Waist Circumference 46.5 inches   Hip Circumference 46 inches   Waist to Hip Ratio 1.01 %   BMI (Calculated) 33.3      Nutrition Therapy Plan and Nutrition Goals:     Nutrition Therapy & Goals - 08/10/15 1802    Nutrition Therapy   Diet 1700-1800kcal Diabetes diet   Drug/Food Interactions Statins/Certain Fruits   Fiber 30 grams   Whole Grain Foods 3 servings   Protein 7 ounces/day   Saturated Fats 13 max. grams   Fruits and Vegetables 8 servings/day   Personal Nutrition Goals   Personal Goal #1 Keep working to control food portions, especially starchy foods (fist-size or less)   Personal Goal #2 Eat plenty of vegetables and fruits (small-medium portions of fruits to control blood sugar)      Nutrition Discharge: Rate Your Plate Scores:     Rate Your Plate - 0916/10/9680454  Rate Your Plate Scores   Pre Score 62   Pre Score % 69 %      Nutrition Goals Re-Evaluation:     Nutrition Goals Re-Evaluation      08/24/15 1333 09/08/15 1248 09/26/15 1612       Personal Goal #1 Re-Evaluation   Personal Goal #1 EaHedy Camaraeports he is doing much better with his food portions!  EaMoldenays his wife really watches what he eats and good healthy forhim.     Goal Progress Seen   Yes     Personal Goal #2 Re-Evaluation   Goal Progress Seen Yes       Comments  EaValeriotates he is following his nutrition guidelines. Recently he has had family reunion and vacation time that has created nutrition challenges.  He is back to better eating habits since the events.         Psychosocial: Target Goals: Acknowledge presence or absence of depression, maximize  coping skills, provide positive support system. Participant is able to verbalize types and ability to use techniques and skills needed for reducing stress and depression.  Initial Review & Psychosocial Screening:     Initial Psych Review & Screening - 07/28/15 1912    Initial Review   Current issues with Current Sleep Concerns;Current Depression;Current Psychotropic Meds      Quality of Life Scores:     Quality  of Life - 08/04/15 1734    Quality of Life Scores   Health/Function Pre 3.6 %   Socioeconomic Pre 20 %   Psych/Spiritual Pre 18 %   Family Pre 22.8 %   GLOBAL Pre 12.55 %      PHQ-9:     Recent Review Flowsheet Data    Depression screen [facility] 2/9 07/28/2015 03/11/2015 12/26/2012   Decreased Interest 1 0 0   Down, Depressed, Hopeless 1 0 0   PHQ - 2 Score 2 0 0   Altered sleeping 2 - -   Tired, decreased energy 3 - -   Change in appetite 1 - -   Feeling bad or failure about yourself  0 - -   Trouble concentrating 2 - -   Moving slowly or fidgety/restless 0 - -   Suicidal thoughts 0 - -   PHQ-9 Score 10 - -   Difficult doing work/chores Not difficult at all - -      Psychosocial Evaluation and Intervention:     Psychosocial Evaluation - 08/03/15 1720    Psychosocial Evaluation & Interventions   Interventions Stress management education;Relaxation education;Encouraged to exercise with the program and follow exercise prescription   Comments Counselor met with Mr. Shimabukuro today for initial psychosocial evaluation.  He is a 69 year old who had 6 stents inserted over the summer and has spent several months in rehabiliation for nerve damage in his leg as a result.  He has a strong support system with a spouse of 19 years and several adult children living close by as well as active involvement in his local church community.  He reports difficulty sleeping since he was taken off a sleep aid by his Dr. and his appetite has decreased since the surgery.  He reports a history of  depression and anxiety and has been addressing this through medication and talk therapy.  He states his current mood is positive and he is optimistic about this program.  His goals are to improve his balance, increase his energy and stamina and lose some more weight.  Counselor encouraged him to check with his Dr. or pharmacist re: an OTC sleep aid since the Melatonin he is using is not working.  Counselor also taught deep breathing and muscle relaxation techniques and will follow with him as needed.   Continued Psychosocial Services Needed --  Mr. Stickels will benefit from the psychoeducational components of this program considering his history of depression and anxiety.  He also will benefit from consistent exercise and learning relaxation techniques.       Psychosocial Re-Evaluation:     Psychosocial Re-Evaluation      08/24/15 1332 08/24/15 1711 09/26/15 1612       Psychosocial Re-Evaluation   Interventions Encouraged to attend Cardiac Rehabilitation for the exercise       Comments --  Beverly so was happy that he was able to attend a high school football game with his walker but still able to go. with his wife.  Mr. Quintela reported today that he is still having difficulty sleeping at night with several medication changes initiated by his doctor, without success.  He agreed to continue to work with his doctor on this and report back if any improvements.  Otherwise, he reports he is feeling "pretty well" and is experiencing benefits from this program.   Hedy Camara "[name]" is so glad that he feels better enough to go to local high school football games.  Vocational Rehabilitation: Provide vocational rehab assistance to qualifying candidates.   Vocational Rehab Evaluation & Intervention:     Vocational Rehab - 07/28/15 1827    Initial Vocational Rehab Evaluation & Intervention   Assessment shows need for Vocational Rehabilitation No  Patient is retired      Education: Education Goals:  Education classes will be provided on a weekly basis, covering required topics. Participant will state understanding/return demonstration of topics presented.  Learning Barriers/Preferences:     Learning Barriers/Preferences - 07/28/15 1827    Learning Barriers/Preferences   Learning Barriers Hearing   Learning Preferences Group Instruction      Education Topics: General Nutrition Guidelines/Fats and Fiber: -Group instruction provided by verbal, written material, models and posters to present the general guidelines for heart healthy nutrition. Gives an explanation and review of dietary fats and fiber.          Cardiac Rehab from 09/26/2015 in [facility] Cardiac Rehab   Date  08/29/15   Educator  PI   Instruction Review Code  2- meets goals/outcomes      Controlling Sodium/Reading Food Labels: -Group verbal and written material supporting the discussion of sodium use in heart healthy nutrition. Review and explanation with models, verbal and written materials for utilization of the food label.      Cardiac Rehab from 09/26/2015 in [facility] Cardiac Rehab   Date  09/05/15   Educator  PI   Instruction Review Code  2- meets goals/outcomes      Exercise Physiology & Risk Factors: - Group verbal and written instruction with models to review the exercise physiology of the cardiovascular system and associated critical values. Details cardiovascular disease risk factors and the goals associated with each risk factor.      Cardiac Rehab from 09/26/2015 in [facility] Cardiac Rehab   Date  09/21/15   Educator  RM   Instruction Review Code  2- meets goals/outcomes      Aerobic Exercise & Resistance Training: - Gives group verbal and written discussion on the health impact of inactivity. On the components of aerobic and resistive training programs and the benefits of this training and how to safely progress through these programs.      Cardiac Rehab from 09/26/2015 in [facility] Cardiac Rehab   Date  09/26/15    Educator  SW   Instruction Review Code  2- meets goals/outcomes      Flexibility, Balance, General Exercise Guidelines: - Provides group verbal and written instruction on the benefits of flexibility and balance training programs. Provides general exercise guidelines with specific guidelines to those with heart or lung disease. Demonstration and skill practice provided.      Cardiac Rehab from 09/26/2015 in [facility] Cardiac Rehab   Date  08/08/15   Educator  SW   Instruction Review Code  2- meets goals/outcomes      Stress Management: - Provides group verbal and written instruction about the health risks of elevated stress, cause of high stress, and healthy ways to reduce stress.      Cardiac Rehab from 09/26/2015 in [facility] Cardiac Rehab   Date  08/10/15   Educator  Kathreen Cornfield   Instruction Review Code  2- meets goals/outcomes      Depression: - Provides group verbal and written instruction on the correlation between heart/lung disease and depressed mood, treatment options, and the stigmas associated with seeking treatment.   Anatomy & Physiology of the Heart: - Group verbal and written instruction and models provide basic cardiac anatomy and  physiology, with the coronary electrical and arterial systems. Review of: AMI, Angina, Valve disease, Heart Failure, Cardiac Arrhythmia, Pacemakers, and the ICD.   Cardiac Procedures: - Group verbal and written instruction and models to describe the testing methods done to diagnose heart disease. Reviews the outcomes of the test results. Describes the treatment choices: Medical Management, Angioplasty, or Coronary Bypass Surgery.      Cardiac Rehab from 09/26/2015 in [facility] Cardiac Rehab   Date  09/12/15   Educator  SB   Instruction Review Code  2- meets goals/outcomes      Cardiac Medications: - Group verbal and written instruction to review commonly prescribed medications for heart disease. Reviews the medication, class of the drug, and side  effects. Includes the steps to properly store meds and maintain the prescription regimen.      Cardiac Rehab from 09/26/2015 in [facility] Cardiac Rehab   Date  09/19/15   Educator  SB   Instruction Review Code  2- meets goals/outcomes      Go Sex-Intimacy & Heart Disease, Get SMART - Goal Setting: - Group verbal and written instruction through game format to discuss heart disease and the return to sexual intimacy. Provides group verbal and written material to discuss and apply goal setting through the application of the S.M.A.R.T. Method.      Cardiac Rehab from 09/26/2015 in [facility] Cardiac Rehab   Date  09/12/15   Educator  SB   Instruction Review Code  2- meets goals/outcomes      Other Matters of the Heart: - Provides group verbal, written materials and models to describe Heart Failure, Angina, Valve Disease, and Diabetes in the realm of heart disease. Includes description of the disease process and treatment options available to the cardiac patient.      Cardiac Rehab from 09/26/2015 in [facility] Cardiac Rehab   Date  08/24/15   Educator  DW   Instruction Review Code  2- meets goals/outcomes      Exercise & Equipment Safety: - Individual verbal instruction and demonstration of equipment use and safety with use of the equipment.      Cardiac Rehab from 09/26/2015 in [facility] Cardiac Rehab   Date  07/28/15   Educator  DW   Instruction Review Code  1- partially meets, needs review/practice      Infection Prevention: - Provides verbal and written material to individual with discussion of infection control including proper hand washing and proper equipment cleaning during exercise session.      Cardiac Rehab from 09/26/2015 in [facility] Cardiac Rehab   Date  07/28/15   Educator  DW   Instruction Review Code  2- meets goals/outcomes      Falls Prevention: - Provides verbal and written material to individual with discussion of falls prevention and safety.      Cardiac Rehab from 09/26/2015 in [facility]  Cardiac Rehab   Date  07/28/15   Educator  DW   Instruction Review Code  1- partially meets, needs review/practice      Diabetes: - Individual verbal and written instruction to review signs/symptoms of diabetes, desired ranges of glucose level fasting, after meals and with exercise. Advice that pre and post exercise glucose checks will be done for 3 sessions at entry of program.      Cardiac Rehab from 09/26/2015 in [facility] Cardiac Rehab   Date  07/28/15   Educator  DW   Instruction Review Code  2- meets goals/outcomes       Knowledge Questionnaire Score:  Knowledge Questionnaire Score - 08/04/15 1733    Knowledge Questionnaire Score   Pre Score 24      Personal Goals and Risk Factors at Admission:     Personal Goals and Risk Factors at Admission - 07/28/15 1829    Personal Goals and Risk Factors on Admission    Weight Management Yes   Intervention Learn and follow the exercise and diet guidelines while in the program. Utilize the nutrition and education classes to help gain knowledge of the diet and exercise expectations in the program   Admit Weight 247 lb 1.6 oz (112.084 kg)   Goal Weight 195 lb (88.451 kg)   Increase Aerobic Exercise and Physical Activity Yes   Intervention While in program, learn and follow the exercise prescription taught. Start at a low level workload and increase workload after able to maintain previous level for 30 minutes. Increase time before increasing intensity.   Take Less Medication Yes   Intervention Learn your risk factors and begin the lifestyle modifications for risk factor control during your time in the program.   Understand more about Heart/Pulmonary Disease. Yes   Intervention While in program utilize professionals for any questions, and attend the education sessions. Great websites to use are www.americanheart.org or www.lung.org for reliable information.   Diabetes Yes   Goal Blood glucose control identified by blood glucose values,  HgbA1C. Participant verbalizes understanding of the signs/symptoms of hyper/hypo glycemia, proper foot care and importance of medication and nutrition plan for blood glucose control.   Intervention Provide nutrition & aerobic exercise along with prescribed medications to achieve blood glucose in normal ranges: Fasting 65-99 mg/dL   Hypertension Yes   Goal Participant will see blood pressure controlled within the values of 140/91m/Hg or within value directed by their physician.   Intervention Provide nutrition & aerobic exercise along with prescribed medications to achieve BP 140/90 or less.   Lipids Yes   Goal Cholesterol controlled with medications as prescribed, with individualized exercise RX and with personalized nutrition plan. Value goals: LDL < 735m HDL > 4028mParticipant states understanding of desired cholesterol values and following prescriptions.   Intervention Provide nutrition & aerobic exercise along with prescribed medications to achieve LDL <37m7mDL >40mg52mStress No   Personal Goal Other Yes   Personal Goal Improve balance   Intervention Other  Throughout program increase endurance, overall conditioning, strength and flexibility.        Personal Goals and Risk Factors Review:      Goals and Risk Factor Review      08/08/15 1752 08/24/15 1330 09/01/15 1735 09/08/15 1250 09/26/15 1611   Weight Management   Goals Progress/Improvement seen    No    Comments    Vaughn Safalurrently up 5 pounds.  He atteributes this to vacation indulgences and family reunion meals!  He is currently returned to his better eating habits and expects to see the weight return to pre vacation level. He is eating less snacks, no bedtime snack. Less frequent AM snacks and is working on portion control during his meals.     Increase Aerobic Exercise and Physical Activity   Goals Progress/Improvement seen  Yes Yes No Yes Yes   Comments Was able to increase duration of exercise to 33 min on the NS.  Patient stated that his legs felt stronger and he is starting to feel  better.  BrittVevelyn Royals he is able to do things that he hasn't been able to do lately like  go to a high school football game.  Jason Frisbee went to the Bathroom with his walker and stated he fell back on the commode. Saleh reported no injury and was able to walk with no problems after. Antonius Hartlage reports that he falls at home alot. Does NOT use the treadmill her but only uses the sit down Nustep equipment.  Rydge is exercising at a pool 45 min for 2 days a week in addition to his CR classes. He is walking at home some.  Britt walked on the treadmill with the exercise specialist Lestine Box at his side last week (had a PT belt on).    Take Less Medication   Goals Progress/Improvement seen    No No   Comments    No changes yet.  Haywood does not anticipate changes soon.  He is aware that continued effort with the lifestyle changes can decrease his need for some of his medications.    Diabetes   Goal  --  Blood sugars are more stable. He is eating smaller portions of food.       Hypertension   Goal  --  Daren's blood pressure has been well controlled-see LSI sheets.      Progress seen toward goals    Yes Yes  Blood pressure is good.   Comments    Nyaire's BP is controlled with his medication at present/  He is continuiong to work on Lifestyle changes to eliminate the need for medication while maintaining BP control.    Abnormal Lipids   Goal  --  Jaque continues to take his cholestrol medicine.       Progress seen towards goals    Yes    Comments    Ricci is working on nutrition guidelines and his exercise progression to work on cholesterol control. No recent labs to see effect of his changes.        Personal Goals Discharge (Final Personal Goals and Risk Factors Review):      Goals and Risk Factor Review - 09/26/15 1611    Increase Aerobic Exercise and Physical Activity   Goals Progress/Improvement seen  Yes   Comments Britt walked on the  treadmill with the exercise specialist Lestine Box at his side last week (had a PT belt on).    Take Less Medication   Goals Progress/Improvement seen No   Hypertension   Progress seen toward goals Yes  Blood pressure is good.       Comments: Ready for discharge

## 2015-10-05 NOTE — Progress Notes (Signed)
Daily Session Note  Patient Details  Name: Steve Phillips MRN: [id number] Date of Birth: 1946/06/03 Referring Provider:  [name], MD  Encounter Date: 10/05/2015  Check In:     Session Check In - 10/05/15 1609    Check-In   Staff Present Gerlene Burdock RN, BSN;Franki Alcaide Dillard Essex MS, ACSM CEP Exercise Physiologist;Diane Mariana Arn, BSN   ER physicians immediately available to respond to emergencies See telemetry face sheet for immediately available ER MD   Medication changes reported     No   Fall or balance concerns reported    No   Warm-up and Cool-down Performed on first and last piece of equipment   VAD Patient? No   Pain Assessment   Currently in Pain? No/denies   Multiple Pain Sites No           Exercise Prescription Changes - 10/05/15 1600    Exercise Review   Progression Yes   Response to Exercise   Symptoms None   Comments Patient is graduating from the program and home exercise plans were discussed. Details of the patient's exercise prescription and what they need to do in order to continue the prescription and progress with exercise were outlined and the patient verbalized understanding. The patient plans to complete all exercise at the [facility] in [place] with his wife.    Duration Progress to 30 minutes of continuous aerobic without signs/symptoms of physical distress   Intensity Rest + 30   Progression Continue progressive overload as per policy without signs/symptoms or physical distress.   Resistance Training   Training Prescription Yes   Weight 4   Reps 10-15   Interval Training   Interval Training No   Treadmill   MPH 1.1   Grade 0   NuStep   Level 9   Watts 55   Minutes 25   Recumbant Elliptical   Level 2   RPM 35   Watts 15   Minutes 15   Home Exercise Plan   Plans to continue exercise at [facility] (comment)  [place]      Goals Met:  Independence with exercise equipment Exercise tolerated  well Personal goals reviewed No report of cardiac concerns or symptoms Strength training completed today  Goals Unmet:  Not Applicable  Goals Comments: Patient completed exercise prescription and all exercise goals during rehab session. The exercise was tolerated well and the patient is progressing in the program. Patient will graduate today and has a clear plan of how to continue with his exercise goals.    Dr. Emily Filbert is Medical Director for [facility] and [facility].

## 2015-10-05 NOTE — Patient Instructions (Signed)
Discharge Instructions  Patient Details  Name: Steve Phillips MRN: [id number] Date of Birth: [birth year] Referring Provider:  [name], MD   Number of Visits: 50 (soon to complete 36 visits  Reason for Discharge:  Patient reached a stable level of exercise. Patient independent in their exercise.  Smoking History:  History  Smoking status  . Former Smoker -- 0.12 packs/day for 10 years  . Types: Cigarettes  . Quit date: 11/08/1971  Smokeless tobacco  . Never Used    Diagnosis:  CAD S/P percutaneous coronary angioplasty  Initial Exercise Prescription:     Initial Exercise Prescription - 07/28/15 1500    Date of Initial Exercise Prescription   Date 07/28/15   Treadmill   MPH 1   Grade 0   Minutes 10   Recumbant Bike   Level 2   RPM 40   Watts 20   Minutes 10   NuStep   Level 2   Watts 40   Minutes 10   Arm Ergometer   Level 1   Watts 10   Minutes 10   REL-XR   Level 2   Watts 40   Minutes 10   Prescription Details   Frequency (times per week) 3   Duration Progress to 30 minutes of continuous aerobic without signs/symptoms of physical distress   Intensity   THRR REST +  30   Ratings of Perceived Exertion 11-15   Perceived Dyspnea 2-4   Progression Continue progressive overload as per policy without signs/symptoms or physical distress.   Resistance Training   Training Prescription Yes   Weight 2   Reps 10-15      Discharge Exercise Prescription (Final Exercise Prescription Changes):     Exercise Prescription Changes - 09/29/15 1600    Exercise Review   Progression Yes   Response to Exercise   Duration Progress to 30 minutes of continuous aerobic without signs/symptoms of physical distress   Intensity Rest + 30   Progression Continue progressive overload as per policy without signs/symptoms or physical distress.   Resistance Training   Training Prescription Yes   Weight 4   Reps 10-15   Interval Training   Interval Training No   NuStep   Level 7   Watts 50   Minutes 25   Recumbant Elliptical   Level 2   RPM 35   Watts 15   Minutes 15   Home Exercise Plan   Plans to continue exercise at [facility] (comment)  [place]      Functional Capacity:     6 Minute Walk      07/28/15 1519 09/29/15 1637     6 Minute Walk   Phase Initial Discharge    Distance 355 feet 410 feet    Walk Time 6 minutes 6 minutes    Resting HR 57 bpm 87 bpm    Resting BP 122/70 mmHg 136/72 mmHg    Max Ex. HR 107 bpm 103 bpm    Max Ex. BP 146/70 mmHg 202/74 mmHg    RPE 13 16    Symptoms No No       Quality of Life:     Quality of Life - 08/04/15 1734    Quality of Life Scores   Health/Function Pre 3.6 %   Socioeconomic Pre 20 %   Psych/Spiritual Pre 18 %   Family Pre 22.8 %   GLOBAL Pre 12.55 %      Personal Goals: Goals established  at orientation with interventions provided to work toward goal.     Personal Goals and Risk Factors at Admission - 07/28/15 1829    Personal Goals and Risk Factors on Admission    Weight Management Yes   Intervention Learn and follow the exercise and diet guidelines while in the program. Utilize the nutrition and education classes to help gain knowledge of the diet and exercise expectations in the program   Admit Weight 247 lb 1.6 oz (112.084 kg)   Goal Weight 195 lb (88.451 kg)   Increase Aerobic Exercise and Physical Activity Yes   Intervention While in program, learn and follow the exercise prescription taught. Start at a low level workload and increase workload after able to maintain previous level for 30 minutes. Increase time before increasing intensity.   Take Less Medication Yes   Intervention Learn your risk factors and begin the lifestyle modifications for risk factor control during your time in the program.   Understand more about Heart/Pulmonary Disease. Yes   Intervention While in program utilize professionals for any questions, and attend the  education sessions. Great websites to use are www.americanheart.org or www.lung.org for reliable information.   Diabetes Yes   Goal Blood glucose control identified by blood glucose values, HgbA1C. Participant verbalizes understanding of the signs/symptoms of hyper/hypo glycemia, proper foot care and importance of medication and nutrition plan for blood glucose control.   Intervention Provide nutrition & aerobic exercise along with prescribed medications to achieve blood glucose in normal ranges: Fasting 65-99 mg/dL   Hypertension Yes   Goal Participant will see blood pressure controlled within the values of 140/57mm/Hg or within value directed by their physician.   Intervention Provide nutrition & aerobic exercise along with prescribed medications to achieve BP 140/90 or less.   Lipids Yes   Goal Cholesterol controlled with medications as prescribed, with individualized exercise RX and with personalized nutrition plan. Value goals: LDL < 70mg , HDL > 40mg . Participant states understanding of desired cholesterol values and following prescriptions.   Intervention Provide nutrition & aerobic exercise along with prescribed medications to achieve LDL 70mg , HDL >40mg .   Stress No   Personal Goal Other Yes   Personal Goal Improve balance   Intervention Other  Throughout program increase endurance, overall conditioning, strength and flexibility.         Personal Goals Discharge:     Goals and Risk Factor Review - 09/26/15 1611    Increase Aerobic Exercise and Physical Activity   Goals Progress/Improvement seen  Yes   Comments Britt walked on the treadmill with the exercise specialist Lestine Box at his side last week (had a PT belt on).    Take Less Medication   Goals Progress/Improvement seen No   Hypertension   Progress seen toward goals Yes  Blood pressure is good.      Nutrition & Weight - Outcomes:     Pre Biometrics - 07/28/15 1523    Pre Biometrics   Height 6' (1.829 m)   Weight  247 lb 1.6 oz (112.084 kg)   Waist Circumference 46 inches   Hip Circumference 50 inches   Waist to Hip Ratio 0.92 %   BMI (Calculated) 33.6         Post Biometrics - 09/29/15 1710     Post  Biometrics   Height 6' (1.829 m)   Weight 245 lb (111.131 kg)   Waist Circumference 46.5 inches   Hip Circumference 46 inches   Waist to Hip Ratio 1.01 %  BMI (Calculated) 33.3      Nutrition:     Nutrition Therapy & Goals - 08/10/15 1802    Nutrition Therapy   Diet 1700-1800kcal Diabetes diet   Drug/Food Interactions Statins/Certain Fruits   Fiber 30 grams   Whole Grain Foods 3 servings   Protein 7 ounces/day   Saturated Fats 13 max. grams   Fruits and Vegetables 8 servings/day   Personal Nutrition Goals   Personal Goal #1 Keep working to control food portions, especially starchy foods (fist-size or less)   Personal Goal #2 Eat plenty of vegetables and fruits (small-medium portions of fruits to control blood sugar)      Nutrition Discharge:     Rate Your Plate - [id number] [id number]    Rate Your Plate Scores   Pre Score 62   Pre Score % 69 %      Education Questionnaire Score:     Knowledge Questionnaire Score - 08/04/15 1733    Knowledge Questionnaire Score   Pre Score 24      Goals reviewed with patient; copy given to patient.

## 2015-10-20 DIAGNOSIS — E1140 Type 2 diabetes mellitus with diabetic neuropathy, unspecified: Secondary | ICD-10-CM | POA: Diagnosis not present

## 2015-10-20 DIAGNOSIS — M4716 Other spondylosis with myelopathy, lumbar region: Secondary | ICD-10-CM | POA: Diagnosis not present

## 2015-10-20 DIAGNOSIS — R262 Difficulty in walking, not elsewhere classified: Secondary | ICD-10-CM | POA: Diagnosis not present

## 2015-10-25 ENCOUNTER — Encounter: Payer: Self-pay | Admitting: Internal Medicine

## 2015-10-25 ENCOUNTER — Ambulatory Visit (INDEPENDENT_AMBULATORY_CARE_PROVIDER_SITE_OTHER): Payer: Medicare Other | Admitting: Internal Medicine

## 2015-10-25 VITALS — BP 165/79 | HR 62 | Temp 98.0°F | Ht 72.0 in | Wt 239.1 lb

## 2015-10-25 DIAGNOSIS — G4700 Insomnia, unspecified: Secondary | ICD-10-CM

## 2015-10-25 DIAGNOSIS — E1149 Type 2 diabetes mellitus with other diabetic neurological complication: Secondary | ICD-10-CM | POA: Diagnosis not present

## 2015-10-25 DIAGNOSIS — I10 Essential (primary) hypertension: Secondary | ICD-10-CM | POA: Diagnosis not present

## 2015-10-25 DIAGNOSIS — I200 Unstable angina: Secondary | ICD-10-CM

## 2015-10-25 MED ORDER — SERTRALINE HCL 50 MG PO TABS: 50.0000 mg | ORAL_TABLET | Freq: Every day | ORAL | Status: DC

## 2015-10-25 NOTE — Assessment & Plan Note (Signed)
Lab Results  Component Value Date   HGBA1C 7.2* 07/21/2015   Encouraged compliance with diet. Continue Lantus and Humalin R. Follow up with Dr. Cruzita Lederer scheduled 12/20.

## 2015-10-25 NOTE — Addendum Note (Signed)
Addended by: Lynford Humphrey on: 10/25/2015 10:24 AM   Modules accepted: Orders

## 2015-10-25 NOTE — Assessment & Plan Note (Signed)
Symptoms improved with Sertraline. Will increase dose to 50mg  daily. Follow up 3 months and prn.

## 2015-10-25 NOTE — Progress Notes (Signed)
Subjective:    Patient ID: Steve Phillips, male    DOB: 03-30-1946, 69 y.o.   MRN: [id number]  HPI  69YO male presents for follow up.  Last seen in 08/2015 for insomnia. Added Sertraline to help with anxiety and insomnia. Sleeping better. Goes to bed about 12am, then wakes about 8am or 9am. Wakes during night to urinate several times.  No recent falls. Frustrated by inability to walk without support of [place] Goh. Continues to work with PT. Gait has improved after stopping Zanaflex.   Has occasional mild headache. Takes Tylenol for some headaches, with resolution of symptoms.  DM - BG generally well controlled, a few near 300s after large meals. No low BG. Compliant with medication. Wanted to change to Invokana, however unable to afford medication.   Wt Readings from Last 3 Encounters:  10/25/15 239 lb 2 oz (108.466 kg)  09/29/15 245 lb (111.131 kg)  09/19/15 248 lb 4 oz (112.605 kg)   BP Readings from Last 3 Encounters:  10/25/15 165/79  09/19/15 175/74  09/13/15 124/68    Past Medical History  Diagnosis Date  . Shingles   . Hyperlipidemia   . Lower leg pain     chronic ,left leg  . Acute angina ([place])   . Depression   . CAD (coronary artery disease)     s/p multiple PCI with stent RCA,LAD and obtuse marginal,followed @ [facility] on the accord study.., cath 02/2012 90% D1, s/p drug-eluting stent Dr. Fletcher [name]  . Shortness of breath   . Hypertension     dr Jerilynn Mages Audelia Acton     [facility] in [place]  . Bell's palsy   . Left leg pain   . 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 Type II diabetes mellitus ([place])   . GERD (gastroesophageal reflux disease)   . Headache(784.0)     "sometimes daily; sometimes weekly" (05/16/2015)  . Stroke [facility]) 2015    denies residual on 05/16/2015  . Chronic lower back pain     "for 3 months now" (05/16/2015)  . Diabetic peripheral neuropathy (HCC)     "feet"  . Falls frequently     "in the last month or so" (05/16/2015)  . 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 Cancer Mother     BREAST  . Heart disease Mother     STENT  . Cancer Father     THROAT  . Heart disease Sister     STENT; HTN  . Heart attack Brother   . Hypertension Brother   . Hypertension Sister    Past Surgical History  Procedure Laterality Date  . Appendectomy    . Tibia fracture surgery Left ~ 2002  . Foot fracture surgery Left ~ 2002    "Jone's fracture"  . Testicle surgery Left 2000's    "put drainage tube in for infection"  . Back surgery    . Anterior cervical decomp/discectomy fusion N/A 04/30/2013    Procedure: ANTERIOR CERVICAL DECOMPRESSION/DISCECTOMY FUSION 2 LEVELS;  Surgeon: Faythe Ghee, MD;  Location: [facility];  Service: Neurosurgery;  Laterality: N/A;  Cervical four-five, Cervical six-seven anterior cervical decompression fusion with trabecular metal cage and plate  . Left heart catheterization with coronary angiogram N/A 02/23/2015    Procedure: LEFT HEART CATHETERIZATION WITH CORONARY ANGIOGRAM;  Surgeon: [name], MD;  Location: [place] CATH LAB;  Service: Cardiovascular;  Laterality: N/A;  . Cholecystectomy  open  1980's  . Cardiac catheterization  02/2012  . Coronary angioplasty with stent placement  ~ 2000-02/2015    "he has a total of 6 stents" (05/16/2015  . Fracture surgery    . Squamous cell carcinoma excision Left 2014    "arm"   Social History   Social History  . Marital Status: Married    Spouse Name: N/A  . Number of Children: N/A  . Years of Education: N/A   Social History Main Topics  . Smoking status: Former Smoker -- 0.12 packs/day for 10 years    Types: Cigarettes    Quit date: 11/08/1971  . Smokeless tobacco: Never Used  . Alcohol Use: No  . Drug Use: No  . Sexual Activity: Not Currently   Other Topics Concern  . 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  Eyes: Negative for visual disturbance.  Respiratory: Negative for cough and shortness of breath.   Cardiovascular: Negative for chest pain, palpitations and leg swelling.  Gastrointestinal: Negative for nausea, vomiting, abdominal pain, diarrhea, constipation and abdominal distention.  Genitourinary: Negative for dysuria, urgency and difficulty urinating.  Musculoskeletal: Negative for arthralgias and gait problem.  Skin: Negative for color change and rash.  Neurological: Positive for weakness and numbness.  Hematological: Negative for adenopathy.  Psychiatric/Behavioral: Positive for sleep disturbance. Negative for dysphoric mood. The patient is not nervous/anxious.        Objective:    BP 165/79 mmHg  Pulse 62  Temp(Src) 98 F (36.7 C) (Oral)  Ht 6' (1.829 m)  Wt 239 lb 2 oz (108.466 kg)  BMI 32.42 kg/m2  SpO2 97% Physical Exam  Constitutional: He is oriented to person, place, and time. He appears well-developed and well-nourished. No distress.  HENT:  Head: Normocephalic and atraumatic.  Right Ear: External ear normal.  Left Ear: External ear normal.  Nose: Nose normal.  Mouth/Throat: Oropharynx is clear and moist. No oropharyngeal exudate.  Eyes: Conjunctivae and EOM are normal. Pupils are equal, round, and reactive to light. Right eye exhibits no discharge. Left eye exhibits no discharge. No scleral icterus.  Neck: Normal range of motion. Neck supple. No tracheal deviation present. No thyromegaly present.  Cardiovascular: Normal rate, regular rhythm and normal heart sounds.  Exam reveals no gallop and no friction rub.   No murmur heard. Pulmonary/Chest: Effort normal and breath sounds normal. No accessory muscle usage. No tachypnea. No respiratory  distress. He has no decreased breath sounds. He has no wheezes. He has no rhonchi. He has no rales. He exhibits no tenderness.  Musculoskeletal: Normal range of motion. He exhibits no edema or tenderness.  Lymphadenopathy:    He has no cervical adenopathy.  Neurological: He is alert and oriented to person, place, and time. He displays no tremor. A sensory deficit is present. No cranial nerve deficit. Coordination and gait abnormal.  Skin: Skin is warm and dry. No rash noted. He is not diaphoretic. No erythema. No pallor.  Psychiatric: He has a normal mood and affect. His behavior is normal. Judgment and thought content normal.          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 Continue Lantus and Humalin R. Follow up with Dr. Cruzita Lederer scheduled 12/20.      Hypertension (Chronic)    BP Readings from Last 3 Encounters:  10/25/15 165/79  09/19/15 175/74  09/13/15 124/68   BP improved somewhat. Will continue current medications.        Unprioritized   Insomnia - Primary    Symptoms improved with Sertraline. Will increase dose to 50mg  daily. Follow up 3 months and prn.      Relevant Medications   sertraline (ZOLOFT) 50 MG tablet       Return in about 3 months (around 01/23/2016) for Recheck.

## 2015-10-25 NOTE — Progress Notes (Signed)
Pre visit review using our clinic review tool, if applicable. No additional management support is needed unless otherwise documented below in the visit note. 

## 2015-10-25 NOTE — Assessment & Plan Note (Signed)
BP Readings from Last 3 Encounters:  10/25/15 165/79  09/19/15 175/74  09/13/15 124/68   BP improved somewhat. Will continue current medications.

## 2015-10-25 NOTE — Progress Notes (Signed)
Cardiac Individual Treatment Plan  Patient Details  Name: TRELON PLUSH MRN: [id number] Date of Birth: December 23, 1945 Referring Provider:  [name], MD  Initial Encounter Date:    Visit Diagnosis: CAD S/P percutaneous coronary angioplasty  Patient's Home Medications on Admission:  Current outpatient prescriptions:  .  acetaminophen (TYLENOL) 325 MG tablet, Take 2 tablets (650 mg total) by mouth every 4 (four) hours as needed for headache or mild pain., Disp: , Rfl:  .  aspirin EC 81 MG tablet, Take 1 tablet (81 mg total) by mouth daily., Disp: , Rfl:  .  canagliflozin (INVOKANA) 100 MG TABS tablet, Take 1 tablet (100 mg total) by mouth daily., Disp: 30 tablet, Rfl: 2 .  carbidopa-levodopa (SINEMET IR) 25-100 MG per tablet, Take 2 tablets by mouth 3 (three) times daily. , Disp: , Rfl:  .  carvedilol (COREG) 25 MG tablet, Take 1 tablet (25 mg total) by mouth 2 (two) times daily., Disp: 180 tablet, Rfl: 3 .  clopidogrel (PLAVIX) 75 MG tablet, Take 1 tablet (75 mg total) by mouth daily with breakfast., Disp: 90 tablet, Rfl: 1 .  cyanocobalamin (,VITAMIN B-12,) 1000 MCG/ML injection, Inject 1,000 mcg into the muscle every 30 (thirty) days. , Disp: , Rfl:  .  DULoxetine (CYMBALTA) 60 MG capsule, Take 1 capsule (60 mg total) by mouth daily. TAKE 1 CAPSULE DAILY, Disp: 90 capsule, Rfl: 1 .  gabapentin (NEURONTIN) 300 MG capsule, Take 1 capsule (300 mg total) by mouth 2 (two) times daily., Disp: 180 capsule, Rfl: 1 .  HYDROcodone-acetaminophen (NORCO/VICODIN) 5-325 MG per tablet, 1-2 po tid prn Earliest Fill Date: 08/04/15, Disp: , Rfl:  .  insulin glargine (LANTUS) 100 UNIT/ML injection, Inject 55 Units into the skin 2 (two) times daily. , Disp: , Rfl:  .  insulin regular (HUMULIN R) 100 units/mL injection, Inject 0.3-0.46 mLs (30-46 Units total) into the skin 3 (three) times daily before meals., Disp: 40 mL, Rfl: 2 .  lisinopril-hydrochlorothiazide (PRINZIDE,ZESTORETIC) 20-12.5 MG tablet, Take 1  tablet by mouth 2 (two) times daily., Disp: 180 tablet, Rfl: 1 .  metFORMIN (GLUCOPHAGE) 1000 MG tablet, Take 1,000 mg by mouth 2 (two) times daily with a meal., Disp: , Rfl:  .  nitroGLYCERIN (NITROSTAT) 0.4 MG SL tablet, Place 1 tablet (0.4 mg total) under the tongue every 5 (five) minutes as needed for chest pain., Disp: 30 tablet, Rfl: 6 .  pantoprazole (PROTONIX) 40 MG tablet, Take 1 tablet (40 mg total) by mouth 2 (two) times daily., Disp: 180 tablet, Rfl: 3 .  sertraline (ZOLOFT) 25 MG tablet, Take 1 tablet (25 mg total) by mouth at bedtime., Disp: 30 tablet, Rfl: 3 .  simvastatin (ZOCOR) 20 MG tablet, Take 1 tablet (20 mg total) by mouth at bedtime., Disp: 90 tablet, Rfl: 3  Past Medical History: Past Medical History  Diagnosis Date  . Shingles   . Hyperlipidemia   . Lower leg pain     chronic ,left leg  . Acute angina ([place])   . Depression   . CAD (coronary artery disease)     s/p multiple PCI with stent RCA,LAD and obtuse marginal,followed @ [facility] on the accord study.., cath 02/2012 90% D1, s/p drug-eluting stent Dr. Fletcher [name]  . Shortness of breath   . Hypertension     dr Jerilynn Mages Audelia Acton     [facility] in [place]  . Bell's palsy   . Left leg pain   . 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  Type II diabetes mellitus ([place])   . GERD (gastroesophageal reflux disease)   . Headache(784.0)     "sometimes daily; sometimes weekly" (05/16/2015)  . Stroke [facility]) 2015    denies residual on 05/16/2015  . Chronic lower back pain     "for 3 months now" (05/16/2015)  . Diabetic peripheral neuropathy ([facility])     "feet"  . Falls frequently     "in the last month or so" (05/16/2015)  . 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 Former Smoker -- 0.12 packs/day for 10 years  . Types: Cigarettes  . Quit date: 11/08/1971  Smokeless tobacco  . Never Used    Labs: Recent Review Flowsheet Data    Labs for ITP Cardiac and Pulmonary Rehab Latest Ref Rng  11/16/2014 02/16/2015 03/05/2015 05/06/2015 07/21/2015   Cholestrol - 189 - 94 - -   LDLCALC - - - 50 - -   LDLDIRECT - 116.0 - - - -   HDL - 38.90(L) - 30(L) - -   Trlycerides - 306.0(H) - 70 - -   Hemoglobin A1c 4.6 - 6.5 % 9.7(H) 9.1(H) 8.7(H) 9.1(H) 7.2(H)       Exercise Target Goals:    Exercise Program Goal: Individual exercise prescription set with THRR, safety & activity barriers. Participant demonstrates ability to understand and report RPE using BORG scale, to self-measure pulse accurately, and to acknowledge the importance of the exercise prescription.  Exercise Prescription Goal: Starting with aerobic activity 30 plus minutes a day, 3 days per week for initial exercise prescription. Provide home exercise prescription and guidelines that participant acknowledges understanding prior to discharge.  Activity Barriers & Risk Stratification:     Activity Barriers & Risk Stratification - 09/01/15 1731    Activity Barriers & Risk Stratification   Activity Barriers Balance Concerns;Assistive Device  Had his walker today with him like Shafin usually does.      6 Minute Walk:     6 Minute Walk      07/28/15 1519 09/29/15 1637     6 Minute Walk   Phase Initial Discharge    Distance 355 feet 410 feet    Walk Time 6 minutes 6 minutes    Resting HR 57 bpm 87 bpm    Resting BP 122/70 mmHg 136/72 mmHg    Max Ex. HR 107 bpm 103 bpm    Max Ex. BP 146/70 mmHg 202/74 mmHg    RPE 13 16    Symptoms No No       Initial Exercise Prescription:     Initial Exercise Prescription - 07/28/15 1500    Date of Initial Exercise Prescription   Date 07/28/15   Treadmill   MPH 1   Grade 0   Minutes 10   Recumbant Bike   Level 2   RPM 40   Watts 20   Minutes 10   NuStep   Level 2   Watts 40   Minutes 10   Arm Ergometer   Level 1   Watts 10   Minutes 10   REL-XR   Level 2   Watts 40   Minutes 10   Prescription Details   Frequency (times per week) 3   Duration Progress to  30 minutes of continuous aerobic without signs/symptoms of physical distress   Intensity   THRR REST +  30   Ratings of Perceived Exertion 11-15   Perceived Dyspnea 2-4   Progression Continue  progressive overload as per policy without signs/symptoms or physical distress.   Resistance Training   Training Prescription Yes   Weight 2   Reps 10-15      Exercise Prescription Changes:     Exercise Prescription Changes      08/01/15 0600 08/17/15 1700 08/29/15 0600 09/19/15 1000 09/29/15 1600   Exercise Review   Progression No  Has not started program yet Yes Yes Yes Yes   Response to Exercise   Blood Pressure (Admit)   180/90 mmHg 126/68 mmHg    Blood Pressure (Exercise)   184/90 mmHg 152/68 mmHg    Blood Pressure (Exit)   160/80 mmHg 132/80 mmHg    Heart Rate (Admit)   90 bpm 82 bpm    Heart Rate (Exercise)   91 bpm 83 bpm    Heart Rate (Exit)   85 bpm 63 bpm    Rating of Perceived Exertion (Exercise)   12 13    Symptoms  None High BP the last few visits. Informed his wife who said they will consult MD. Blood pressure is looking better. Last visit BP was in acceptable range    Comments  Reviewed individualized exercise prescription and made increases per departmental policy. Exercise increases were discussed with the patient and they were able to perform the new work loads without issue (no signs or symptoms).  [name] is making increases on hjs own which is great. He is responding well to the exercise and progressing steadily on all of the equipment. [name] is progressing well and increasing in his aerobic endurance.    Duration  Progress to 30 minutes of continuous aerobic without signs/symptoms of physical distress Progress to 30 minutes of continuous aerobic without signs/symptoms of physical distress Progress to 30 minutes of continuous aerobic without signs/symptoms of physical distress Progress to 30 minutes of continuous aerobic without signs/symptoms of physical distress   Intensity   Rest + 30 Rest + 30 Rest + 30 Rest + 30   Progression  Continue progressive overload as per policy without signs/symptoms or physical distress. Continue progressive overload as per policy without signs/symptoms or physical distress. Continue progressive overload as per policy without signs/symptoms or physical distress. Continue progressive overload as per policy without signs/symptoms or physical distress.   Resistance Training   Training Prescription  Yes Yes Yes Yes   Weight  4 4 4 4    Reps  10-15 10-15 10-15 10-15   Interval Training   Interval Training  No No No No   NuStep   Level  4 5 7 7    Watts  35 35 50 50   Minutes  20 25 25 25    Recumbant Elliptical   Level   2 2 2    RPM   35 35 35   Watts   15 15 15    Minutes   15 15 15    Home Exercise Plan   Plans to continue exercise at     [facility] (comment)  [facility] [place]     10/05/15 1600           Exercise Review   Progression Yes       Response to Exercise   Symptoms None       Comments Patient is graduating from the program and home exercise plans were discussed. Details of the patient's exercise prescription and what they need to do in order to continue the prescription and progress with exercise were outlined and the patient verbalized understanding.  The patient plans to complete all exercise at the [facility] in [place] with his wife.        Duration Progress to 30 minutes of continuous aerobic without signs/symptoms of physical distress       Intensity Rest + 30       Progression Continue progressive overload as per policy without signs/symptoms or physical distress.       Resistance Training   Training Prescription Yes       Weight 4       Reps 10-15       Interval Training   Interval Training No       Treadmill   MPH 1.1       Grade 0       NuStep   Level 9       Watts 55       Minutes 25       Recumbant Elliptical   Level 2       RPM 35       Watts 15       Minutes 15       Home  Exercise Plan   Plans to continue exercise at [facility] (comment)  [facility] [place]          Discharge Exercise Prescription (Final Exercise Prescription Changes):     Exercise Prescription Changes - 10/05/15 1600    Exercise Review   Progression Yes   Response to Exercise   Symptoms None   Comments Patient is graduating from the program and home exercise plans were discussed. Details of the patient's exercise prescription and what they need to do in order to continue the prescription and progress with exercise were outlined and the patient verbalized understanding. The patient plans to complete all exercise at the [facility] in [place] with his wife.    Duration Progress to 30 minutes of continuous aerobic without signs/symptoms of physical distress   Intensity Rest + 30   Progression Continue progressive overload as per policy without signs/symptoms or physical distress.   Resistance Training   Training Prescription Yes   Weight 4   Reps 10-15   Interval Training   Interval Training No   Treadmill   MPH 1.1   Grade 0   NuStep   Level 9   Watts 55   Minutes 25   Recumbant Elliptical   Level 2   RPM 35   Watts 15   Minutes 15   Home Exercise Plan   Plans to continue exercise at [facility] (comment)  [facility] [place]      Nutrition:  Target Goals: Understanding of nutrition guidelines, daily intake of sodium <1563m, cholesterol <2054m calories 30% from fat and 7% or less from saturated fats, daily to have 5 or more servings of fruits and vegetables.  Biometrics:     Pre Biometrics - 07/28/15 1523    Pre Biometrics   Height 6' (1.829 m)   Weight 247 lb 1.6 oz (112.084 kg)   Waist Circumference 46 inches   Hip Circumference 50 inches   Waist to Hip Ratio 0.92 %   BMI (Calculated) 33.6         Post Biometrics - 09/29/15 1710     Post  Biometrics   Height 6' (1.829 m)   Weight 245 lb (111.131 kg)   Waist Circumference  46.5 inches   Hip Circumference 46 inches   Waist to Hip Ratio 1.01 %  BMI (Calculated) 33.3      Nutrition Therapy Plan and Nutrition Goals:     Nutrition Therapy & Goals - 08/10/15 1802    Nutrition Therapy   Diet 1700-1800kcal Diabetes diet   Drug/Food Interactions Statins/Certain Fruits   Fiber 30 grams   Whole Grain Foods 3 servings   Protein 7 ounces/day   Saturated Fats 13 max. grams   Fruits and Vegetables 8 servings/day   Personal Nutrition Goals   Personal Goal #1 Keep working to control food portions, especially starchy foods (fist-size or less)   Personal Goal #2 Eat plenty of vegetables and fruits (small-medium portions of fruits to control blood sugar)      Nutrition Discharge: Rate Your Plate Scores:     Rate Your Plate - 92/33/00 7622    Rate Your Plate Scores   Pre Score 62   Pre Score % 69 %      Nutrition Goals Re-Evaluation:     Nutrition Goals Re-Evaluation      08/24/15 1333 09/08/15 1248 09/26/15 1612       Personal Goal #1 Re-Evaluation   Personal Goal #1 Hedy Camara reports he is doing much better with his food portions!  Hanf says his wife really watches what he eats and good healthy forhim.     Goal Progress Seen   Yes     Personal Goal #2 Re-Evaluation   Goal Progress Seen Yes       Comments  Haim states he is following his nutrition guidelines. Recently he has had family reunion and vacation time that has created nutrition challenges.  He is back to better eating habits since the events.         Psychosocial: Target Goals: Acknowledge presence or absence of depression, maximize coping skills, provide positive support system. Participant is able to verbalize types and ability to use techniques and skills needed for reducing stress and depression.  Initial Review & Psychosocial Screening:     Initial Psych Review & Screening - 07/28/15 1912    Initial Review   Current issues with Current Sleep Concerns;Current Depression;Current  Psychotropic Meds      Quality of Life Scores:     Quality of Life - 08/04/15 1734    Quality of Life Scores   Health/Function Pre 3.6 %   Socioeconomic Pre 20 %   Psych/Spiritual Pre 18 %   Family Pre 22.8 %   GLOBAL Pre 12.55 %      PHQ-9:     Recent Review Flowsheet Data    Depression screen [facility] 2/9 07/28/2015 03/11/2015 12/26/2012   Decreased Interest 1 0 0   Down, Depressed, Hopeless 1 0 0   PHQ - 2 Score 2 0 0   Altered sleeping 2 - -   Tired, decreased energy 3 - -   Change in appetite 1 - -   Feeling bad or failure about yourself  0 - -   Trouble concentrating 2 - -   Moving slowly or fidgety/restless 0 - -   Suicidal thoughts 0 - -   PHQ-9 Score 10 - -   Difficult doing work/chores Not difficult at all - -      Psychosocial Evaluation and Intervention:     Psychosocial Evaluation - 08/03/15 1720    Psychosocial Evaluation & Interventions   Interventions Stress management education;Relaxation education;Encouraged to exercise with the program and follow exercise prescription   Comments Counselor met with Mr. Redditt today for initial psychosocial evaluation.  He is  a 69 year old who had 6 stents inserted over the summer and has spent several months in rehabiliation for nerve damage in his leg as a result.  He has a strong support system with a spouse of 38 years and several adult children living close by as well as active involvement in his local church community.  He reports difficulty sleeping since he was taken off a sleep aid by his Dr. and his appetite has decreased since the surgery.  He reports a history of depression and anxiety and has been addressing this through medication and talk therapy.  He states his current mood is positive and he is optimistic about this program.  His goals are to improve his balance, increase his energy and stamina and lose some more weight.  Counselor encouraged him to check with his Dr. or pharmacist re: an OTC sleep aid since the  Melatonin he is using is not working.  Counselor also taught deep breathing and muscle relaxation techniques and will follow with him as needed.   Continued Psychosocial Services Needed --  Mr. Casher will benefit from the psychoeducational components of this program considering his history of depression and anxiety.  He also will benefit from consistent exercise and learning relaxation techniques.       Psychosocial Re-Evaluation:     Psychosocial Re-Evaluation      08/24/15 1332 08/24/15 1711 09/26/15 1612       Psychosocial Re-Evaluation   Interventions Encouraged to attend Cardiac Rehabilitation for the exercise       Comments --  Gilverto so was happy that he was able to attend a high school football game with his walker but still able to go. with his wife.  Mr. Camps reported today that he is still having difficulty sleeping at night with several medication changes initiated by his doctor, without success.  He agreed to continue to work with his doctor on this and report back if any improvements.  Otherwise, he reports he is feeling "pretty well" and is experiencing benefits from this program.   Hedy Camara "[name]" is so glad that he feels better enough to go to local high school football games.         Vocational Rehabilitation: Provide vocational rehab assistance to qualifying candidates.   Vocational Rehab Evaluation & Intervention:     Vocational Rehab - 07/28/15 1827    Initial Vocational Rehab Evaluation & Intervention   Assessment shows need for Vocational Rehabilitation No  Patient is retired      Education: Education Goals: Education classes will be provided on a weekly basis, covering required topics. Participant will state understanding/return demonstration of topics presented.  Learning Barriers/Preferences:     Learning Barriers/Preferences - 07/28/15 1827    Learning Barriers/Preferences   Learning Barriers Hearing   Learning Preferences Group Instruction       Education Topics: General Nutrition Guidelines/Fats and Fiber: -Group instruction provided by verbal, written material, models and posters to present the general guidelines for heart healthy nutrition. Gives an explanation and review of dietary fats and fiber.          Cardiac Rehab from 10/05/2015 in [facility] Cardiac Rehab   Date  08/29/15   Educator  PI   Instruction Review Code  2- meets goals/outcomes      Controlling Sodium/Reading Food Labels: -Group verbal and written material supporting the discussion of sodium use in heart healthy nutrition. Review and explanation with models, verbal and written materials for utilization of the food label.  Cardiac Rehab from 10/05/2015 in [facility] Cardiac Rehab   Date  09/05/15   Educator  PI   Instruction Review Code  2- meets goals/outcomes      Exercise Physiology & Risk Factors: - Group verbal and written instruction with models to review the exercise physiology of the cardiovascular system and associated critical values. Details cardiovascular disease risk factors and the goals associated with each risk factor.      Cardiac Rehab from 10/05/2015 in [facility] Cardiac Rehab   Date  09/21/15   Educator  RM   Instruction Review Code  2- meets goals/outcomes      Aerobic Exercise & Resistance Training: - Gives group verbal and written discussion on the health impact of inactivity. On the components of aerobic and resistive training programs and the benefits of this training and how to safely progress through these programs.      Cardiac Rehab from 10/05/2015 in [facility] Cardiac Rehab   Date  09/26/15   Educator  SW   Instruction Review Code  2- meets goals/outcomes      Flexibility, Balance, General Exercise Guidelines: - Provides group verbal and written instruction on the benefits of flexibility and balance training programs. Provides general exercise guidelines with specific guidelines to those with heart or lung disease. Demonstration  and skill practice provided.      Cardiac Rehab from 10/05/2015 in [facility] Cardiac Rehab   Date  08/08/15   Educator  SW   Instruction Review Code  2- meets goals/outcomes      Stress Management: - Provides group verbal and written instruction about the health risks of elevated stress, cause of high stress, and healthy ways to reduce stress.      Cardiac Rehab from 10/05/2015 in [facility] Cardiac Rehab   Date  10/05/15   Educator  Kathreen Cornfield   Instruction Review Code  2- meets goals/outcomes      Depression: - Provides group verbal and written instruction on the correlation between heart/lung disease and depressed mood, treatment options, and the stigmas associated with seeking treatment.   Anatomy & Physiology of the Heart: - Group verbal and written instruction and models provide basic cardiac anatomy and physiology, with the coronary electrical and arterial systems. Review of: AMI, Angina, Valve disease, Heart Failure, Cardiac Arrhythmia, Pacemakers, and the ICD.   Cardiac Procedures: - Group verbal and written instruction and models to describe the testing methods done to diagnose heart disease. Reviews the outcomes of the test results. Describes the treatment choices: Medical Management, Angioplasty, or Coronary Bypass Surgery.      Cardiac Rehab from 10/05/2015 in [facility] Cardiac Rehab   Date  09/12/15   Educator  SB   Instruction Review Code  2- meets goals/outcomes      Cardiac Medications: - Group verbal and written instruction to review commonly prescribed medications for heart disease. Reviews the medication, class of the drug, and side effects. Includes the steps to properly store meds and maintain the prescription regimen.      Cardiac Rehab from 10/05/2015 in [facility] Cardiac Rehab   Date  09/19/15   Educator  SB   Instruction Review Code  2- meets goals/outcomes      Go Sex-Intimacy & Heart Disease, Get SMART - Goal Setting: - Group verbal and written instruction through  game format to discuss heart disease and the return to sexual intimacy. Provides group verbal and written material to discuss and apply goal setting through the application of the S.M.A.R.T. Method.  Cardiac Rehab from 10/05/2015 in [facility] Cardiac Rehab   Date  09/12/15   Educator  SB   Instruction Review Code  2- meets goals/outcomes      Other Matters of the Heart: - Provides group verbal, written materials and models to describe Heart Failure, Angina, Valve Disease, and Diabetes in the realm of heart disease. Includes description of the disease process and treatment options available to the cardiac patient.      Cardiac Rehab from 10/05/2015 in [facility] Cardiac Rehab   Date  08/24/15   Educator  DW   Instruction Review Code  2- meets goals/outcomes      Exercise & Equipment Safety: - Individual verbal instruction and demonstration of equipment use and safety with use of the equipment.      Cardiac Rehab from 10/05/2015 in [facility] Cardiac Rehab   Date  07/28/15   Educator  DW   Instruction Review Code  1- partially meets, needs review/practice      Infection Prevention: - Provides verbal and written material to individual with discussion of infection control including proper hand washing and proper equipment cleaning during exercise session.      Cardiac Rehab from 10/05/2015 in [facility] Cardiac Rehab   Date  07/28/15   Educator  DW   Instruction Review Code  2- meets goals/outcomes      Falls Prevention: - Provides verbal and written material to individual with discussion of falls prevention and safety.      Cardiac Rehab from 10/05/2015 in [facility] Cardiac Rehab   Date  07/28/15   Educator  DW   Instruction Review Code  1- partially meets, needs review/practice      Diabetes: - Individual verbal and written instruction to review signs/symptoms of diabetes, desired ranges of glucose level fasting, after meals and with exercise. Advice that pre and post exercise glucose checks will  be done for 3 sessions at entry of program.      Cardiac Rehab from 10/05/2015 in [facility] Cardiac Rehab   Date  07/28/15   Educator  DW   Instruction Review Code  2- meets goals/outcomes       Knowledge Questionnaire Score:     Knowledge Questionnaire Score - 08/04/15 1733    Knowledge Questionnaire Score   Pre Score 24      Personal Goals and Risk Factors at Admission:     Personal Goals and Risk Factors at Admission - 07/28/15 1829    Personal Goals and Risk Factors on Admission    Weight Management Yes   Intervention Learn and follow the exercise and diet guidelines while in the program. Utilize the nutrition and education classes to help gain knowledge of the diet and exercise expectations in the program   Admit Weight 247 lb 1.6 oz (112.084 kg)   Goal Weight 195 lb (88.451 kg)   Increase Aerobic Exercise and Physical Activity Yes   Intervention While in program, learn and follow the exercise prescription taught. Start at a low level workload and increase workload after able to maintain previous level for 30 minutes. Increase time before increasing intensity.   Take Less Medication Yes   Intervention Learn your risk factors and begin the lifestyle modifications for risk factor control during your time in the program.   Understand more about Heart/Pulmonary Disease. Yes   Intervention While in program utilize professionals for any questions, and attend the education sessions. Great websites to use are www.americanheart.org or www.lung.org for reliable information.   Diabetes Yes  Goal Blood glucose control identified by blood glucose values, HgbA1C. Participant verbalizes understanding of the signs/symptoms of hyper/hypo glycemia, proper foot care and importance of medication and nutrition plan for blood glucose control.   Intervention Provide nutrition & aerobic exercise along with prescribed medications to achieve blood glucose in normal ranges: Fasting 65-99 mg/dL    Hypertension Yes   Goal Participant will see blood pressure controlled within the values of 140/2m/Hg or within value directed by their physician.   Intervention Provide nutrition & aerobic exercise along with prescribed medications to achieve BP 140/90 or less.   Lipids Yes   Goal Cholesterol controlled with medications as prescribed, with individualized exercise RX and with personalized nutrition plan. Value goals: LDL < 765m HDL > 4041mParticipant states understanding of desired cholesterol values and following prescriptions.   Intervention Provide nutrition & aerobic exercise along with prescribed medications to achieve LDL <52m59mDL >40mg40mStress No   Personal Goal Other Yes   Personal Goal Improve balance   Intervention Other  Throughout program increase endurance, overall conditioning, strength and flexibility.        Personal Goals and Risk Factors Review:      Goals and Risk Factor Review      08/08/15 1752 08/24/15 1330 09/01/15 1735 09/08/15 1250 09/26/15 1611   Weight Management   Goals Progress/Improvement seen    No    Comments    Kvon Dmitriurrently up 5 pounds.  He atteributes this to vacation indulgences and family reunion meals!  He is currently returned to his better eating habits and expects to see the weight return to pre vacation level. He is eating less snacks, no bedtime snack. Less frequent AM snacks and is working on portion control during his meals.     Increase Aerobic Exercise and Physical Activity   Goals Progress/Improvement seen  Yes Yes No Yes Yes   Comments Was able to increase duration of exercise to 33 min on the NS. Patient stated that his legs felt stronger and he is starting to feel  better.  BrittVevelyn Royals he is able to do things that he hasn't been able to do lately like go to a high school football game.  Sarath Jolon Degante to the Bathroom with his walker and stated he fell back on the commode. Dalvin Naheimrted no injury and was able to walk with no  problems after. Amado Don Tiurts that he falls at home alot. Does NOT use the treadmill her but only uses the sit down Nustep equipment.  Thayer Coexercising at a pool 45 min for 2 days a week in addition to his CR classes. He is walking at home some.  Britt walked on the treadmill with the exercise specialist SteveLestine Boxis side last week (had a PT belt on).    Take Less Medication   Goals Progress/Improvement seen    No No   Comments    No changes yet.  Jasim Caison not anticipate changes soon.  He is aware that continued effort with the lifestyle changes can decrease his need for some of his medications.    Diabetes   Goal  --  Blood sugars are more stable. He is eating smaller portions of food.       Hypertension   Goal  --  Jc's blood pressure has been well controlled-see LSI sheets.      Progress seen toward goals    Yes Yes  Blood pressure is good.  Comments    Graysen's BP is controlled with his medication at present/  He is continuiong to work on Lifestyle changes to eliminate the need for medication while maintaining BP control.    Abnormal Lipids   Goal  --  Emmett continues to take his cholestrol medicine.       Progress seen towards goals    Yes    Comments    Chip is working on nutrition guidelines and his exercise progression to work on cholesterol control. No recent labs to see effect of his changes.        Personal Goals Discharge (Final Personal Goals and Risk Factors Review):      Goals and Risk Factor Review - 09/26/15 1611    Increase Aerobic Exercise and Physical Activity   Goals Progress/Improvement seen  Yes   Comments Britt walked on the treadmill with the exercise specialist Lestine Box at his side last week (had a PT belt on).    Take Less Medication   Goals Progress/Improvement seen No   Hypertension   Progress seen toward goals Yes  Blood pressure is good.      ITP Comments:   Comments: Discharged

## 2015-10-25 NOTE — Patient Instructions (Signed)
Increase Sertraline to 50mg  daily at bedtime.  Follow up in 3 months.

## 2015-10-31 DIAGNOSIS — Z8669 Personal history of other diseases of the nervous system and sense organs: Secondary | ICD-10-CM | POA: Diagnosis not present

## 2015-10-31 DIAGNOSIS — R55 Syncope and collapse: Secondary | ICD-10-CM | POA: Diagnosis not present

## 2015-10-31 DIAGNOSIS — R32 Unspecified urinary incontinence: Secondary | ICD-10-CM | POA: Diagnosis not present

## 2015-10-31 DIAGNOSIS — Z7902 Long term (current) use of antithrombotics/antiplatelets: Secondary | ICD-10-CM | POA: Diagnosis not present

## 2015-10-31 DIAGNOSIS — Z8673 Personal history of transient ischemic attack (TIA), and cerebral infarction without residual deficits: Secondary | ICD-10-CM | POA: Diagnosis not present

## 2015-10-31 DIAGNOSIS — K219 Gastro-esophageal reflux disease without esophagitis: Secondary | ICD-10-CM | POA: Diagnosis not present

## 2015-10-31 DIAGNOSIS — R531 Weakness: Secondary | ICD-10-CM | POA: Diagnosis not present

## 2015-10-31 DIAGNOSIS — G8929 Other chronic pain: Secondary | ICD-10-CM | POA: Diagnosis not present

## 2015-10-31 DIAGNOSIS — I440 Atrioventricular block, first degree: Secondary | ICD-10-CM | POA: Diagnosis not present

## 2015-10-31 DIAGNOSIS — E1140 Type 2 diabetes mellitus with diabetic neuropathy, unspecified: Secondary | ICD-10-CM | POA: Diagnosis not present

## 2015-10-31 DIAGNOSIS — G4700 Insomnia, unspecified: Secondary | ICD-10-CM | POA: Diagnosis not present

## 2015-10-31 DIAGNOSIS — E785 Hyperlipidemia, unspecified: Secondary | ICD-10-CM | POA: Diagnosis not present

## 2015-10-31 DIAGNOSIS — R404 Transient alteration of awareness: Secondary | ICD-10-CM | POA: Diagnosis not present

## 2015-10-31 DIAGNOSIS — Z885 Allergy status to narcotic agent status: Secondary | ICD-10-CM | POA: Diagnosis not present

## 2015-10-31 DIAGNOSIS — D2371 Other benign neoplasm of skin of right lower limb, including hip: Secondary | ICD-10-CM | POA: Diagnosis not present

## 2015-10-31 DIAGNOSIS — G959 Disease of spinal cord, unspecified: Secondary | ICD-10-CM | POA: Diagnosis not present

## 2015-10-31 DIAGNOSIS — F0390 Unspecified dementia without behavioral disturbance: Secondary | ICD-10-CM | POA: Diagnosis not present

## 2015-10-31 DIAGNOSIS — I10 Essential (primary) hypertension: Secondary | ICD-10-CM | POA: Diagnosis not present

## 2015-10-31 DIAGNOSIS — R358 Other polyuria: Secondary | ICD-10-CM | POA: Diagnosis not present

## 2015-10-31 DIAGNOSIS — R51 Headache: Secondary | ICD-10-CM | POA: Diagnosis not present

## 2015-10-31 DIAGNOSIS — R938 Abnormal findings on diagnostic imaging of other specified body structures: Secondary | ICD-10-CM | POA: Diagnosis not present

## 2015-10-31 DIAGNOSIS — G214 Vascular parkinsonism: Secondary | ICD-10-CM | POA: Diagnosis not present

## 2015-10-31 DIAGNOSIS — Z79899 Other long term (current) drug therapy: Secondary | ICD-10-CM | POA: Diagnosis not present

## 2015-10-31 DIAGNOSIS — Z794 Long term (current) use of insulin: Secondary | ICD-10-CM | POA: Diagnosis not present

## 2015-10-31 DIAGNOSIS — Z7982 Long term (current) use of aspirin: Secondary | ICD-10-CM | POA: Diagnosis not present

## 2015-10-31 DIAGNOSIS — R9431 Abnormal electrocardiogram [ECG] [EKG]: Secondary | ICD-10-CM | POA: Diagnosis not present

## 2015-10-31 DIAGNOSIS — I739 Peripheral vascular disease, unspecified: Secondary | ICD-10-CM | POA: Diagnosis not present

## 2015-10-31 DIAGNOSIS — F329 Major depressive disorder, single episode, unspecified: Secondary | ICD-10-CM | POA: Diagnosis not present

## 2015-10-31 DIAGNOSIS — E1165 Type 2 diabetes mellitus with hyperglycemia: Secondary | ICD-10-CM | POA: Diagnosis not present

## 2015-10-31 DIAGNOSIS — I2510 Atherosclerotic heart disease of native coronary artery without angina pectoris: Secondary | ICD-10-CM | POA: Diagnosis not present

## 2015-10-31 DIAGNOSIS — B351 Tinea unguium: Secondary | ICD-10-CM | POA: Diagnosis not present

## 2015-10-31 DIAGNOSIS — E1142 Type 2 diabetes mellitus with diabetic polyneuropathy: Secondary | ICD-10-CM | POA: Diagnosis not present

## 2015-10-31 DIAGNOSIS — Z9181 History of falling: Secondary | ICD-10-CM | POA: Diagnosis not present

## 2015-11-01 ENCOUNTER — Encounter: Payer: Self-pay | Admitting: Internal Medicine

## 2015-11-08 ENCOUNTER — Ambulatory Visit: Payer: Medicare Other | Admitting: Internal Medicine

## 2015-11-08 DIAGNOSIS — Z885 Allergy status to narcotic agent status: Secondary | ICD-10-CM | POA: Diagnosis not present

## 2015-11-08 DIAGNOSIS — R351 Nocturia: Secondary | ICD-10-CM | POA: Diagnosis not present

## 2015-11-08 DIAGNOSIS — F3341 Major depressive disorder, recurrent, in partial remission: Secondary | ICD-10-CM | POA: Diagnosis not present

## 2015-11-08 DIAGNOSIS — R600 Localized edema: Secondary | ICD-10-CM | POA: Diagnosis not present

## 2015-11-08 DIAGNOSIS — D509 Iron deficiency anemia, unspecified: Secondary | ICD-10-CM | POA: Diagnosis not present

## 2015-11-08 DIAGNOSIS — E1149 Type 2 diabetes mellitus with other diabetic neurological complication: Secondary | ICD-10-CM | POA: Diagnosis not present

## 2015-11-08 DIAGNOSIS — Z87891 Personal history of nicotine dependence: Secondary | ICD-10-CM | POA: Diagnosis not present

## 2015-11-08 DIAGNOSIS — K219 Gastro-esophageal reflux disease without esophagitis: Secondary | ICD-10-CM | POA: Diagnosis not present

## 2015-11-08 DIAGNOSIS — G4710 Hypersomnia, unspecified: Secondary | ICD-10-CM | POA: Diagnosis not present

## 2015-11-08 DIAGNOSIS — G3184 Mild cognitive impairment, so stated: Secondary | ICD-10-CM | POA: Diagnosis not present

## 2015-11-08 DIAGNOSIS — R809 Proteinuria, unspecified: Secondary | ICD-10-CM | POA: Diagnosis not present

## 2015-11-08 DIAGNOSIS — R0683 Snoring: Secondary | ICD-10-CM | POA: Diagnosis not present

## 2015-11-08 DIAGNOSIS — R400 Somnolence: Secondary | ICD-10-CM | POA: Diagnosis not present

## 2015-11-08 DIAGNOSIS — I2510 Atherosclerotic heart disease of native coronary artery without angina pectoris: Secondary | ICD-10-CM | POA: Diagnosis not present

## 2015-11-08 DIAGNOSIS — Z794 Long term (current) use of insulin: Secondary | ICD-10-CM | POA: Diagnosis not present

## 2015-11-08 DIAGNOSIS — G2581 Restless legs syndrome: Secondary | ICD-10-CM | POA: Diagnosis not present

## 2015-11-08 DIAGNOSIS — H1132 Conjunctival hemorrhage, left eye: Secondary | ICD-10-CM | POA: Diagnosis not present

## 2015-11-08 DIAGNOSIS — Z7984 Long term (current) use of oral hypoglycemic drugs: Secondary | ICD-10-CM | POA: Diagnosis not present

## 2015-11-08 DIAGNOSIS — E1165 Type 2 diabetes mellitus with hyperglycemia: Secondary | ICD-10-CM | POA: Diagnosis not present

## 2015-11-08 DIAGNOSIS — R2689 Other abnormalities of gait and mobility: Secondary | ICD-10-CM | POA: Diagnosis not present

## 2015-11-08 DIAGNOSIS — Z7982 Long term (current) use of aspirin: Secondary | ICD-10-CM | POA: Diagnosis not present

## 2015-11-08 DIAGNOSIS — E785 Hyperlipidemia, unspecified: Secondary | ICD-10-CM | POA: Diagnosis not present

## 2015-11-08 DIAGNOSIS — Z8673 Personal history of transient ischemic attack (TIA), and cerebral infarction without residual deficits: Secondary | ICD-10-CM | POA: Diagnosis not present

## 2015-11-08 DIAGNOSIS — R5382 Chronic fatigue, unspecified: Secondary | ICD-10-CM | POA: Diagnosis not present

## 2015-11-08 DIAGNOSIS — I10 Essential (primary) hypertension: Secondary | ICD-10-CM | POA: Diagnosis not present

## 2015-11-18 ENCOUNTER — Other Ambulatory Visit: Payer: Self-pay | Admitting: Internal Medicine

## 2015-11-21 DIAGNOSIS — Z87891 Personal history of nicotine dependence: Secondary | ICD-10-CM | POA: Diagnosis not present

## 2015-11-21 DIAGNOSIS — G8929 Other chronic pain: Secondary | ICD-10-CM | POA: Diagnosis not present

## 2015-11-21 DIAGNOSIS — M545 Low back pain: Secondary | ICD-10-CM | POA: Diagnosis not present

## 2015-11-21 DIAGNOSIS — Z7982 Long term (current) use of aspirin: Secondary | ICD-10-CM | POA: Diagnosis not present

## 2015-11-21 DIAGNOSIS — M5126 Other intervertebral disc displacement, lumbar region: Secondary | ICD-10-CM | POA: Diagnosis not present

## 2015-11-21 DIAGNOSIS — N50819 Testicular pain, unspecified: Secondary | ICD-10-CM | POA: Diagnosis not present

## 2015-11-21 DIAGNOSIS — K219 Gastro-esophageal reflux disease without esophagitis: Secondary | ICD-10-CM | POA: Diagnosis not present

## 2015-11-21 DIAGNOSIS — Z794 Long term (current) use of insulin: Secondary | ICD-10-CM | POA: Diagnosis not present

## 2015-11-21 DIAGNOSIS — Z79891 Long term (current) use of opiate analgesic: Secondary | ICD-10-CM | POA: Diagnosis not present

## 2015-11-21 DIAGNOSIS — E119 Type 2 diabetes mellitus without complications: Secondary | ICD-10-CM | POA: Diagnosis not present

## 2015-11-21 DIAGNOSIS — Z8673 Personal history of transient ischemic attack (TIA), and cerebral infarction without residual deficits: Secondary | ICD-10-CM | POA: Diagnosis not present

## 2015-11-21 DIAGNOSIS — Z7984 Long term (current) use of oral hypoglycemic drugs: Secondary | ICD-10-CM | POA: Diagnosis not present

## 2015-11-21 DIAGNOSIS — I2510 Atherosclerotic heart disease of native coronary artery without angina pectoris: Secondary | ICD-10-CM | POA: Diagnosis not present

## 2015-11-21 DIAGNOSIS — Z885 Allergy status to narcotic agent status: Secondary | ICD-10-CM | POA: Diagnosis not present

## 2015-11-21 DIAGNOSIS — M4806 Spinal stenosis, lumbar region: Secondary | ICD-10-CM | POA: Diagnosis not present

## 2015-11-21 DIAGNOSIS — Z79899 Other long term (current) drug therapy: Secondary | ICD-10-CM | POA: Diagnosis not present

## 2015-11-21 DIAGNOSIS — M79604 Pain in right leg: Secondary | ICD-10-CM | POA: Diagnosis not present

## 2015-11-21 DIAGNOSIS — E785 Hyperlipidemia, unspecified: Secondary | ICD-10-CM | POA: Diagnosis not present

## 2015-11-21 DIAGNOSIS — M47816 Spondylosis without myelopathy or radiculopathy, lumbar region: Secondary | ICD-10-CM | POA: Diagnosis not present

## 2015-11-21 DIAGNOSIS — I10 Essential (primary) hypertension: Secondary | ICD-10-CM | POA: Diagnosis not present

## 2015-11-21 DIAGNOSIS — M160 Bilateral primary osteoarthritis of hip: Secondary | ICD-10-CM | POA: Diagnosis not present

## 2015-11-22 DIAGNOSIS — H2513 Age-related nuclear cataract, bilateral: Secondary | ICD-10-CM | POA: Diagnosis not present

## 2015-11-22 LAB — HM DIABETES EYE EXAM

## 2015-11-23 DIAGNOSIS — G4710 Hypersomnia, unspecified: Secondary | ICD-10-CM | POA: Diagnosis not present

## 2015-11-23 DIAGNOSIS — R600 Localized edema: Secondary | ICD-10-CM | POA: Diagnosis not present

## 2015-11-23 DIAGNOSIS — F3341 Major depressive disorder, recurrent, in partial remission: Secondary | ICD-10-CM | POA: Diagnosis not present

## 2015-11-23 DIAGNOSIS — Z7902 Long term (current) use of antithrombotics/antiplatelets: Secondary | ICD-10-CM | POA: Diagnosis not present

## 2015-11-23 DIAGNOSIS — I2510 Atherosclerotic heart disease of native coronary artery without angina pectoris: Secondary | ICD-10-CM | POA: Diagnosis not present

## 2015-11-23 DIAGNOSIS — Z885 Allergy status to narcotic agent status: Secondary | ICD-10-CM | POA: Diagnosis not present

## 2015-11-23 DIAGNOSIS — R079 Chest pain, unspecified: Secondary | ICD-10-CM | POA: Diagnosis not present

## 2015-11-23 DIAGNOSIS — D509 Iron deficiency anemia, unspecified: Secondary | ICD-10-CM | POA: Diagnosis not present

## 2015-11-23 DIAGNOSIS — M5441 Lumbago with sciatica, right side: Secondary | ICD-10-CM | POA: Diagnosis not present

## 2015-11-23 DIAGNOSIS — E1140 Type 2 diabetes mellitus with diabetic neuropathy, unspecified: Secondary | ICD-10-CM | POA: Diagnosis not present

## 2015-11-23 DIAGNOSIS — I10 Essential (primary) hypertension: Secondary | ICD-10-CM | POA: Diagnosis not present

## 2015-11-23 DIAGNOSIS — Z7982 Long term (current) use of aspirin: Secondary | ICD-10-CM | POA: Diagnosis not present

## 2015-11-23 DIAGNOSIS — Z7984 Long term (current) use of oral hypoglycemic drugs: Secondary | ICD-10-CM | POA: Diagnosis not present

## 2015-11-23 DIAGNOSIS — Z79899 Other long term (current) drug therapy: Secondary | ICD-10-CM | POA: Diagnosis not present

## 2015-11-23 DIAGNOSIS — G8929 Other chronic pain: Secondary | ICD-10-CM | POA: Diagnosis not present

## 2015-11-23 DIAGNOSIS — M79606 Pain in leg, unspecified: Secondary | ICD-10-CM | POA: Diagnosis not present

## 2015-11-23 DIAGNOSIS — N401 Enlarged prostate with lower urinary tract symptoms: Secondary | ICD-10-CM | POA: Diagnosis not present

## 2015-11-23 DIAGNOSIS — E785 Hyperlipidemia, unspecified: Secondary | ICD-10-CM | POA: Diagnosis not present

## 2015-11-23 DIAGNOSIS — K219 Gastro-esophageal reflux disease without esophagitis: Secondary | ICD-10-CM | POA: Diagnosis not present

## 2015-11-23 DIAGNOSIS — E1165 Type 2 diabetes mellitus with hyperglycemia: Secondary | ICD-10-CM | POA: Diagnosis not present

## 2015-11-23 DIAGNOSIS — Z8673 Personal history of transient ischemic attack (TIA), and cerebral infarction without residual deficits: Secondary | ICD-10-CM | POA: Diagnosis not present

## 2015-11-23 DIAGNOSIS — Z79891 Long term (current) use of opiate analgesic: Secondary | ICD-10-CM | POA: Diagnosis not present

## 2015-11-23 DIAGNOSIS — Z9181 History of falling: Secondary | ICD-10-CM | POA: Diagnosis not present

## 2015-11-23 DIAGNOSIS — M25551 Pain in right hip: Secondary | ICD-10-CM | POA: Diagnosis not present

## 2015-11-23 DIAGNOSIS — M79604 Pain in right leg: Secondary | ICD-10-CM | POA: Diagnosis not present

## 2015-11-23 DIAGNOSIS — S32009A Unspecified fracture of unspecified lumbar vertebra, initial encounter for closed fracture: Secondary | ICD-10-CM | POA: Diagnosis not present

## 2015-11-23 DIAGNOSIS — Z794 Long term (current) use of insulin: Secondary | ICD-10-CM | POA: Diagnosis not present

## 2015-11-23 DIAGNOSIS — I440 Atrioventricular block, first degree: Secondary | ICD-10-CM | POA: Diagnosis not present

## 2015-11-23 DIAGNOSIS — G3184 Mild cognitive impairment, so stated: Secondary | ICD-10-CM | POA: Diagnosis not present

## 2015-11-23 DIAGNOSIS — M545 Low back pain: Secondary | ICD-10-CM | POA: Diagnosis not present

## 2015-11-23 DIAGNOSIS — Z87891 Personal history of nicotine dependence: Secondary | ICD-10-CM | POA: Diagnosis not present

## 2015-11-23 DIAGNOSIS — R9431 Abnormal electrocardiogram [ECG] [EKG]: Secondary | ICD-10-CM | POA: Diagnosis not present

## 2015-11-23 DIAGNOSIS — R001 Bradycardia, unspecified: Secondary | ICD-10-CM | POA: Diagnosis not present

## 2015-11-23 DIAGNOSIS — R972 Elevated prostate specific antigen [PSA]: Secondary | ICD-10-CM | POA: Diagnosis not present

## 2015-11-24 DIAGNOSIS — R52 Pain, unspecified: Secondary | ICD-10-CM | POA: Diagnosis not present

## 2015-11-24 DIAGNOSIS — E1140 Type 2 diabetes mellitus with diabetic neuropathy, unspecified: Secondary | ICD-10-CM | POA: Diagnosis not present

## 2015-11-24 DIAGNOSIS — I10 Essential (primary) hypertension: Secondary | ICD-10-CM | POA: Diagnosis not present

## 2015-11-24 DIAGNOSIS — I2510 Atherosclerotic heart disease of native coronary artery without angina pectoris: Secondary | ICD-10-CM | POA: Diagnosis not present

## 2015-11-30 DIAGNOSIS — Z794 Long term (current) use of insulin: Secondary | ICD-10-CM | POA: Diagnosis not present

## 2015-11-30 DIAGNOSIS — Z9181 History of falling: Secondary | ICD-10-CM | POA: Diagnosis not present

## 2015-11-30 DIAGNOSIS — I2510 Atherosclerotic heart disease of native coronary artery without angina pectoris: Secondary | ICD-10-CM | POA: Diagnosis not present

## 2015-11-30 DIAGNOSIS — F3341 Major depressive disorder, recurrent, in partial remission: Secondary | ICD-10-CM | POA: Diagnosis not present

## 2015-11-30 DIAGNOSIS — E785 Hyperlipidemia, unspecified: Secondary | ICD-10-CM | POA: Diagnosis not present

## 2015-11-30 DIAGNOSIS — Z8673 Personal history of transient ischemic attack (TIA), and cerebral infarction without residual deficits: Secondary | ICD-10-CM | POA: Diagnosis not present

## 2015-11-30 DIAGNOSIS — M8448XD Pathological fracture, other site, subsequent encounter for fracture with routine healing: Secondary | ICD-10-CM | POA: Diagnosis not present

## 2015-11-30 DIAGNOSIS — K219 Gastro-esophageal reflux disease without esophagitis: Secondary | ICD-10-CM | POA: Diagnosis not present

## 2015-11-30 DIAGNOSIS — R3914 Feeling of incomplete bladder emptying: Secondary | ICD-10-CM | POA: Diagnosis not present

## 2015-11-30 DIAGNOSIS — E1149 Type 2 diabetes mellitus with other diabetic neurological complication: Secondary | ICD-10-CM | POA: Diagnosis not present

## 2015-11-30 DIAGNOSIS — G20 Parkinson's disease: Secondary | ICD-10-CM | POA: Diagnosis not present

## 2015-11-30 DIAGNOSIS — M5441 Lumbago with sciatica, right side: Secondary | ICD-10-CM | POA: Diagnosis not present

## 2015-11-30 DIAGNOSIS — D509 Iron deficiency anemia, unspecified: Secondary | ICD-10-CM | POA: Diagnosis not present

## 2015-11-30 DIAGNOSIS — G3184 Mild cognitive impairment, so stated: Secondary | ICD-10-CM | POA: Diagnosis not present

## 2015-11-30 DIAGNOSIS — M6281 Muscle weakness (generalized): Secondary | ICD-10-CM | POA: Diagnosis not present

## 2015-11-30 DIAGNOSIS — E1140 Type 2 diabetes mellitus with diabetic neuropathy, unspecified: Secondary | ICD-10-CM | POA: Diagnosis not present

## 2015-11-30 DIAGNOSIS — M4806 Spinal stenosis, lumbar region: Secondary | ICD-10-CM | POA: Diagnosis not present

## 2015-11-30 DIAGNOSIS — I10 Essential (primary) hypertension: Secondary | ICD-10-CM | POA: Diagnosis not present

## 2015-12-02 DIAGNOSIS — E1140 Type 2 diabetes mellitus with diabetic neuropathy, unspecified: Secondary | ICD-10-CM | POA: Diagnosis not present

## 2015-12-02 DIAGNOSIS — D509 Iron deficiency anemia, unspecified: Secondary | ICD-10-CM | POA: Diagnosis not present

## 2015-12-02 DIAGNOSIS — M8448XD Pathological fracture, other site, subsequent encounter for fracture with routine healing: Secondary | ICD-10-CM | POA: Diagnosis not present

## 2015-12-02 DIAGNOSIS — M4806 Spinal stenosis, lumbar region: Secondary | ICD-10-CM | POA: Diagnosis not present

## 2015-12-02 DIAGNOSIS — M6281 Muscle weakness (generalized): Secondary | ICD-10-CM | POA: Diagnosis not present

## 2015-12-02 DIAGNOSIS — M5441 Lumbago with sciatica, right side: Secondary | ICD-10-CM | POA: Diagnosis not present

## 2015-12-03 DIAGNOSIS — W19XXXA Unspecified fall, initial encounter: Secondary | ICD-10-CM | POA: Diagnosis not present

## 2015-12-03 DIAGNOSIS — Z8249 Family history of ischemic heart disease and other diseases of the circulatory system: Secondary | ICD-10-CM | POA: Diagnosis not present

## 2015-12-03 DIAGNOSIS — S301XXA Contusion of abdominal wall, initial encounter: Secondary | ICD-10-CM | POA: Diagnosis not present

## 2015-12-03 DIAGNOSIS — M549 Dorsalgia, unspecified: Secondary | ICD-10-CM | POA: Diagnosis not present

## 2015-12-03 DIAGNOSIS — Z8673 Personal history of transient ischemic attack (TIA), and cerebral infarction without residual deficits: Secondary | ICD-10-CM | POA: Diagnosis not present

## 2015-12-03 DIAGNOSIS — Z9181 History of falling: Secondary | ICD-10-CM | POA: Diagnosis not present

## 2015-12-03 DIAGNOSIS — Z79891 Long term (current) use of opiate analgesic: Secondary | ICD-10-CM | POA: Diagnosis not present

## 2015-12-03 DIAGNOSIS — G8929 Other chronic pain: Secondary | ICD-10-CM | POA: Diagnosis not present

## 2015-12-03 DIAGNOSIS — M5136 Other intervertebral disc degeneration, lumbar region: Secondary | ICD-10-CM | POA: Diagnosis not present

## 2015-12-03 DIAGNOSIS — S32048A Other fracture of fourth lumbar vertebra, initial encounter for closed fracture: Secondary | ICD-10-CM | POA: Diagnosis not present

## 2015-12-03 DIAGNOSIS — R262 Difficulty in walking, not elsewhere classified: Secondary | ICD-10-CM | POA: Diagnosis not present

## 2015-12-03 DIAGNOSIS — Z8669 Personal history of other diseases of the nervous system and sense organs: Secondary | ICD-10-CM | POA: Diagnosis not present

## 2015-12-03 DIAGNOSIS — S32029A Unspecified fracture of second lumbar vertebra, initial encounter for closed fracture: Secondary | ICD-10-CM | POA: Diagnosis not present

## 2015-12-03 DIAGNOSIS — S32009A Unspecified fracture of unspecified lumbar vertebra, initial encounter for closed fracture: Secondary | ICD-10-CM | POA: Diagnosis not present

## 2015-12-03 DIAGNOSIS — S32038A Other fracture of third lumbar vertebra, initial encounter for closed fracture: Secondary | ICD-10-CM | POA: Diagnosis present

## 2015-12-03 DIAGNOSIS — R339 Retention of urine, unspecified: Secondary | ICD-10-CM | POA: Diagnosis present

## 2015-12-03 DIAGNOSIS — Z7984 Long term (current) use of oral hypoglycemic drugs: Secondary | ICD-10-CM | POA: Diagnosis not present

## 2015-12-03 DIAGNOSIS — R109 Unspecified abdominal pain: Secondary | ICD-10-CM | POA: Diagnosis not present

## 2015-12-03 DIAGNOSIS — R10819 Abdominal tenderness, unspecified site: Secondary | ICD-10-CM | POA: Diagnosis present

## 2015-12-03 DIAGNOSIS — R488 Other symbolic dysfunctions: Secondary | ICD-10-CM | POA: Diagnosis not present

## 2015-12-03 DIAGNOSIS — Z794 Long term (current) use of insulin: Secondary | ICD-10-CM | POA: Diagnosis not present

## 2015-12-03 DIAGNOSIS — E785 Hyperlipidemia, unspecified: Secondary | ICD-10-CM | POA: Diagnosis not present

## 2015-12-03 DIAGNOSIS — M47816 Spondylosis without myelopathy or radiculopathy, lumbar region: Secondary | ICD-10-CM | POA: Diagnosis not present

## 2015-12-03 DIAGNOSIS — I2510 Atherosclerotic heart disease of native coronary artery without angina pectoris: Secondary | ICD-10-CM | POA: Diagnosis not present

## 2015-12-03 DIAGNOSIS — M25461 Effusion, right knee: Secondary | ICD-10-CM | POA: Diagnosis not present

## 2015-12-03 DIAGNOSIS — F3341 Major depressive disorder, recurrent, in partial remission: Secondary | ICD-10-CM | POA: Diagnosis present

## 2015-12-03 DIAGNOSIS — Z87891 Personal history of nicotine dependence: Secondary | ICD-10-CM | POA: Diagnosis not present

## 2015-12-03 DIAGNOSIS — M6250 Muscle wasting and atrophy, not elsewhere classified, unspecified site: Secondary | ICD-10-CM | POA: Diagnosis not present

## 2015-12-03 DIAGNOSIS — K219 Gastro-esophageal reflux disease without esophagitis: Secondary | ICD-10-CM | POA: Diagnosis present

## 2015-12-03 DIAGNOSIS — M6281 Muscle weakness (generalized): Secondary | ICD-10-CM | POA: Diagnosis not present

## 2015-12-03 DIAGNOSIS — E119 Type 2 diabetes mellitus without complications: Secondary | ICD-10-CM | POA: Diagnosis not present

## 2015-12-03 DIAGNOSIS — I10 Essential (primary) hypertension: Secondary | ICD-10-CM | POA: Diagnosis not present

## 2015-12-03 DIAGNOSIS — Z955 Presence of coronary angioplasty implant and graft: Secondary | ICD-10-CM | POA: Diagnosis not present

## 2015-12-03 DIAGNOSIS — S32008A Other fracture of unspecified lumbar vertebra, initial encounter for closed fracture: Secondary | ICD-10-CM | POA: Diagnosis not present

## 2015-12-03 DIAGNOSIS — S32000D Wedge compression fracture of unspecified lumbar vertebra, subsequent encounter for fracture with routine healing: Secondary | ICD-10-CM | POA: Diagnosis not present

## 2015-12-03 DIAGNOSIS — S32028A Other fracture of second lumbar vertebra, initial encounter for closed fracture: Secondary | ICD-10-CM | POA: Diagnosis present

## 2015-12-03 DIAGNOSIS — Z7982 Long term (current) use of aspirin: Secondary | ICD-10-CM | POA: Diagnosis not present

## 2015-12-03 DIAGNOSIS — M25561 Pain in right knee: Secondary | ICD-10-CM | POA: Diagnosis not present

## 2015-12-03 DIAGNOSIS — G3184 Mild cognitive impairment, so stated: Secondary | ICD-10-CM | POA: Diagnosis present

## 2015-12-03 HISTORY — DX: Unspecified fracture of unspecified lumbar vertebra, initial encounter for closed fracture: S32.009A

## 2015-12-07 DIAGNOSIS — E1149 Type 2 diabetes mellitus with other diabetic neurological complication: Secondary | ICD-10-CM | POA: Diagnosis not present

## 2015-12-07 DIAGNOSIS — F419 Anxiety disorder, unspecified: Secondary | ICD-10-CM | POA: Diagnosis not present

## 2015-12-07 DIAGNOSIS — H269 Unspecified cataract: Secondary | ICD-10-CM | POA: Diagnosis present

## 2015-12-07 DIAGNOSIS — S32009A Unspecified fracture of unspecified lumbar vertebra, initial encounter for closed fracture: Secondary | ICD-10-CM | POA: Diagnosis not present

## 2015-12-07 DIAGNOSIS — Z7902 Long term (current) use of antithrombotics/antiplatelets: Secondary | ICD-10-CM | POA: Diagnosis not present

## 2015-12-07 DIAGNOSIS — Z955 Presence of coronary angioplasty implant and graft: Secondary | ICD-10-CM | POA: Diagnosis not present

## 2015-12-07 DIAGNOSIS — M6250 Muscle wasting and atrophy, not elsewhere classified, unspecified site: Secondary | ICD-10-CM | POA: Diagnosis not present

## 2015-12-07 DIAGNOSIS — F3341 Major depressive disorder, recurrent, in partial remission: Secondary | ICD-10-CM | POA: Diagnosis not present

## 2015-12-07 DIAGNOSIS — H40003 Preglaucoma, unspecified, bilateral: Secondary | ICD-10-CM | POA: Diagnosis not present

## 2015-12-07 DIAGNOSIS — S32008A Other fracture of unspecified lumbar vertebra, initial encounter for closed fracture: Secondary | ICD-10-CM | POA: Diagnosis not present

## 2015-12-07 DIAGNOSIS — Z794 Long term (current) use of insulin: Secondary | ICD-10-CM | POA: Diagnosis not present

## 2015-12-07 DIAGNOSIS — E1122 Type 2 diabetes mellitus with diabetic chronic kidney disease: Secondary | ICD-10-CM | POA: Diagnosis not present

## 2015-12-07 DIAGNOSIS — H2511 Age-related nuclear cataract, right eye: Secondary | ICD-10-CM | POA: Diagnosis not present

## 2015-12-07 DIAGNOSIS — I10 Essential (primary) hypertension: Secondary | ICD-10-CM | POA: Diagnosis not present

## 2015-12-07 DIAGNOSIS — K219 Gastro-esophageal reflux disease without esophagitis: Secondary | ICD-10-CM | POA: Diagnosis not present

## 2015-12-07 DIAGNOSIS — H2513 Age-related nuclear cataract, bilateral: Secondary | ICD-10-CM | POA: Diagnosis not present

## 2015-12-07 DIAGNOSIS — I2510 Atherosclerotic heart disease of native coronary artery without angina pectoris: Secondary | ICD-10-CM | POA: Diagnosis not present

## 2015-12-07 DIAGNOSIS — M5001 Cervical disc disorder with myelopathy,  high cervical region: Secondary | ICD-10-CM | POA: Diagnosis not present

## 2015-12-07 DIAGNOSIS — Z9049 Acquired absence of other specified parts of digestive tract: Secondary | ICD-10-CM | POA: Diagnosis not present

## 2015-12-07 DIAGNOSIS — E1140 Type 2 diabetes mellitus with diabetic neuropathy, unspecified: Secondary | ICD-10-CM | POA: Diagnosis not present

## 2015-12-07 DIAGNOSIS — Z885 Allergy status to narcotic agent status: Secondary | ICD-10-CM | POA: Diagnosis not present

## 2015-12-07 DIAGNOSIS — Z888 Allergy status to other drugs, medicaments and biological substances status: Secondary | ICD-10-CM | POA: Diagnosis not present

## 2015-12-07 DIAGNOSIS — R262 Difficulty in walking, not elsewhere classified: Secondary | ICD-10-CM | POA: Diagnosis not present

## 2015-12-07 DIAGNOSIS — Z9889 Other specified postprocedural states: Secondary | ICD-10-CM | POA: Diagnosis not present

## 2015-12-07 DIAGNOSIS — Z79899 Other long term (current) drug therapy: Secondary | ICD-10-CM | POA: Diagnosis not present

## 2015-12-07 DIAGNOSIS — E0842 Diabetes mellitus due to underlying condition with diabetic polyneuropathy: Secondary | ICD-10-CM | POA: Diagnosis not present

## 2015-12-07 DIAGNOSIS — Z9181 History of falling: Secondary | ICD-10-CM | POA: Diagnosis not present

## 2015-12-07 DIAGNOSIS — Z79891 Long term (current) use of opiate analgesic: Secondary | ICD-10-CM | POA: Diagnosis not present

## 2015-12-07 DIAGNOSIS — R488 Other symbolic dysfunctions: Secondary | ICD-10-CM | POA: Diagnosis not present

## 2015-12-07 DIAGNOSIS — M545 Low back pain: Secondary | ICD-10-CM | POA: Diagnosis not present

## 2015-12-07 DIAGNOSIS — E7800 Pure hypercholesterolemia, unspecified: Secondary | ICD-10-CM | POA: Diagnosis not present

## 2015-12-07 DIAGNOSIS — Z7982 Long term (current) use of aspirin: Secondary | ICD-10-CM | POA: Diagnosis not present

## 2015-12-07 DIAGNOSIS — Z85828 Personal history of other malignant neoplasm of skin: Secondary | ICD-10-CM | POA: Diagnosis not present

## 2015-12-07 DIAGNOSIS — E785 Hyperlipidemia, unspecified: Secondary | ICD-10-CM | POA: Diagnosis not present

## 2015-12-07 DIAGNOSIS — S32000D Wedge compression fracture of unspecified lumbar vertebra, subsequent encounter for fracture with routine healing: Secondary | ICD-10-CM | POA: Diagnosis not present

## 2015-12-07 DIAGNOSIS — Z87891 Personal history of nicotine dependence: Secondary | ICD-10-CM | POA: Diagnosis not present

## 2015-12-07 DIAGNOSIS — E1142 Type 2 diabetes mellitus with diabetic polyneuropathy: Secondary | ICD-10-CM | POA: Diagnosis not present

## 2015-12-07 DIAGNOSIS — M6281 Muscle weakness (generalized): Secondary | ICD-10-CM | POA: Diagnosis not present

## 2015-12-13 DIAGNOSIS — H2513 Age-related nuclear cataract, bilateral: Secondary | ICD-10-CM | POA: Diagnosis not present

## 2015-12-19 ENCOUNTER — Encounter: Payer: Self-pay | Admitting: *Deleted

## 2015-12-20 DIAGNOSIS — F3341 Major depressive disorder, recurrent, in partial remission: Secondary | ICD-10-CM | POA: Diagnosis not present

## 2015-12-20 DIAGNOSIS — I10 Essential (primary) hypertension: Secondary | ICD-10-CM | POA: Diagnosis not present

## 2015-12-20 DIAGNOSIS — F419 Anxiety disorder, unspecified: Secondary | ICD-10-CM | POA: Diagnosis not present

## 2015-12-20 DIAGNOSIS — S32008A Other fracture of unspecified lumbar vertebra, initial encounter for closed fracture: Secondary | ICD-10-CM | POA: Diagnosis not present

## 2015-12-20 DIAGNOSIS — E1149 Type 2 diabetes mellitus with other diabetic neurological complication: Secondary | ICD-10-CM | POA: Diagnosis not present

## 2015-12-21 ENCOUNTER — Encounter: Payer: Self-pay | Admitting: *Deleted

## 2015-12-22 NOTE — Discharge Instructions (Signed)

## 2015-12-26 ENCOUNTER — Ambulatory Visit
Admission: RE | Admit: 2015-12-26 | Discharge: 2015-12-26 | Disposition: A | Discharge status: Rehabilitation Facility | Payer: Medicare Other | Source: Ambulatory Visit | Attending: Ophthalmology | Admitting: Ophthalmology

## 2015-12-26 ENCOUNTER — Encounter: Payer: Self-pay | Admitting: *Deleted

## 2015-12-26 ENCOUNTER — Encounter
Admission: RE | Disposition: A | Discharge status: Rehabilitation Facility | Payer: Self-pay | Source: Ambulatory Visit | Attending: Ophthalmology

## 2015-12-26 ENCOUNTER — Ambulatory Visit: Payer: Medicare Other | Admitting: Anesthesiology

## 2015-12-26 DIAGNOSIS — H2511 Age-related nuclear cataract, right eye: Secondary | ICD-10-CM | POA: Insufficient documentation

## 2015-12-26 DIAGNOSIS — H2513 Age-related nuclear cataract, bilateral: Secondary | ICD-10-CM | POA: Diagnosis not present

## 2015-12-26 DIAGNOSIS — Z885 Allergy status to narcotic agent status: Secondary | ICD-10-CM | POA: Insufficient documentation

## 2015-12-26 DIAGNOSIS — Z85828 Personal history of other malignant neoplasm of skin: Secondary | ICD-10-CM | POA: Insufficient documentation

## 2015-12-26 DIAGNOSIS — Z87891 Personal history of nicotine dependence: Secondary | ICD-10-CM | POA: Insufficient documentation

## 2015-12-26 DIAGNOSIS — I2510 Atherosclerotic heart disease of native coronary artery without angina pectoris: Secondary | ICD-10-CM | POA: Insufficient documentation

## 2015-12-26 DIAGNOSIS — Z9889 Other specified postprocedural states: Secondary | ICD-10-CM | POA: Insufficient documentation

## 2015-12-26 DIAGNOSIS — E7800 Pure hypercholesterolemia, unspecified: Secondary | ICD-10-CM | POA: Insufficient documentation

## 2015-12-26 DIAGNOSIS — E1140 Type 2 diabetes mellitus with diabetic neuropathy, unspecified: Secondary | ICD-10-CM | POA: Insufficient documentation

## 2015-12-26 DIAGNOSIS — Z9049 Acquired absence of other specified parts of digestive tract: Secondary | ICD-10-CM | POA: Insufficient documentation

## 2015-12-26 DIAGNOSIS — Z79899 Other long term (current) drug therapy: Secondary | ICD-10-CM | POA: Insufficient documentation

## 2015-12-26 DIAGNOSIS — Z955 Presence of coronary angioplasty implant and graft: Secondary | ICD-10-CM | POA: Insufficient documentation

## 2015-12-26 DIAGNOSIS — I10 Essential (primary) hypertension: Secondary | ICD-10-CM | POA: Insufficient documentation

## 2015-12-26 DIAGNOSIS — Z888 Allergy status to other drugs, medicaments and biological substances status: Secondary | ICD-10-CM | POA: Insufficient documentation

## 2015-12-26 DIAGNOSIS — Z7902 Long term (current) use of antithrombotics/antiplatelets: Secondary | ICD-10-CM | POA: Insufficient documentation

## 2015-12-26 DIAGNOSIS — Z794 Long term (current) use of insulin: Secondary | ICD-10-CM | POA: Insufficient documentation

## 2015-12-26 DIAGNOSIS — Z7982 Long term (current) use of aspirin: Secondary | ICD-10-CM | POA: Insufficient documentation

## 2015-12-26 DIAGNOSIS — Z79891 Long term (current) use of opiate analgesic: Secondary | ICD-10-CM | POA: Insufficient documentation

## 2015-12-26 HISTORY — PX: CATARACT EXTRACTION W/PHACO: SHX586

## 2015-12-26 HISTORY — DX: Presence of dental prosthetic device (complete) (partial): Z97.2

## 2015-12-26 HISTORY — DX: Unspecified fracture of unspecified lumbar vertebra, initial encounter for closed fracture: S32.009A

## 2015-12-26 LAB — GLUCOSE, CAPILLARY
GLUCOSE-CAPILLARY: 195 mg/dL — AB (ref 65–99)
Glucose-Capillary: 212 mg/dL — ABNORMAL HIGH (ref 65–99)

## 2015-12-26 SURGERY — PHACOEMULSIFICATION, CATARACT, WITH IOL INSERTION
Anesthesia: Monitor Anesthesia Care | Laterality: Right | Wound class: Clean

## 2015-12-26 MED ORDER — BRIMONIDINE TARTRATE 0.2 % OP SOLN
OPHTHALMIC | Status: DC | PRN
  Administered 2015-12-26: 1 [drp] via OPHTHALMIC

## 2015-12-26 MED ORDER — TIMOLOL MALEATE 0.5 % OP SOLN
OPHTHALMIC | Status: DC | PRN
  Administered 2015-12-26: 1 [drp] via OPHTHALMIC

## 2015-12-26 MED ORDER — NA HYALUR & NA CHOND-NA HYALUR 0.4-0.35 ML IO KIT
PACK | INTRAOCULAR | Status: DC | PRN
  Administered 2015-12-26: 1 mL via INTRAOCULAR

## 2015-12-26 MED ORDER — CEFUROXIME OPHTHALMIC INJECTION 1 MG/0.1 ML
INJECTION | OPHTHALMIC | Status: DC | PRN
Start: 2015-12-26 — End: 2015-12-26
  Administered 2015-12-26: 0.1 mL via OPHTHALMIC

## 2015-12-26 MED ORDER — FENTANYL CITRATE (PF) 100 MCG/2ML IJ SOLN
INTRAMUSCULAR | Status: DC | PRN
  Administered 2015-12-26: 50 ug via INTRAVENOUS

## 2015-12-26 MED ORDER — LIDOCAINE HCL (PF) 4 % IJ SOLN
INTRAOCULAR | Status: DC | PRN
  Administered 2015-12-26: 1 mL via OPHTHALMIC

## 2015-12-26 MED ORDER — MIDAZOLAM HCL 2 MG/2ML IJ SOLN
INTRAMUSCULAR | Status: DC | PRN
  Administered 2015-12-26: 1 mg via INTRAVENOUS

## 2015-12-26 MED ORDER — ARMC OPHTHALMIC DILATING GEL
1.0000 "application " | OPHTHALMIC | Status: DC | PRN
  Administered 2015-12-26 (×2): 1 via OPHTHALMIC

## 2015-12-26 MED ORDER — EPINEPHRINE HCL 1 MG/ML IJ SOLN
INTRAOCULAR | Status: DC | PRN
  Administered 2015-12-26: 158 mL via OPHTHALMIC

## 2015-12-26 MED ORDER — TETRACAINE HCL 0.5 % OP SOLN
1.0000 [drp] | OPHTHALMIC | Status: DC | PRN
  Administered 2015-12-26: 1 [drp] via OPHTHALMIC

## 2015-12-26 MED ORDER — POVIDONE-IODINE 5 % OP SOLN
1.0000 "application " | OPHTHALMIC | Status: DC | PRN
  Administered 2015-12-26: 1 via OPHTHALMIC

## 2015-12-26 SURGICAL SUPPLY — 29 items
APPLICATOR COTTON TIP 3IN (MISCELLANEOUS) ×3 IMPLANT
CANNULA ANT/CHMB 27GA (MISCELLANEOUS) ×3 IMPLANT
DISSECTOR HYDRO NUCLEUS 50X22 (MISCELLANEOUS) ×3 IMPLANT
GLOVE BIO SURGEON STRL SZ7 (GLOVE) ×3 IMPLANT
GLOVE SURG LX 6.5 MICRO (GLOVE) ×2
GLOVE SURG LX STRL 6.5 MICRO (GLOVE) ×1 IMPLANT
GOWN STRL REUS W/ TWL LRG LVL3 (GOWN DISPOSABLE) ×2 IMPLANT
GOWN STRL REUS W/TWL LRG LVL3 (GOWN DISPOSABLE) ×4
LENS IOL ACRSF IQ PC 21.0 (Intraocular Lens) ×1 IMPLANT
LENS IOL ACRYSOF IQ POST 21.0 (Intraocular Lens) ×3 IMPLANT
MARKER SKIN SURG W/RULER VIO (MISCELLANEOUS) ×3 IMPLANT
NEEDLE FILTER BLUNT 18X 1/2SAF (NEEDLE) ×2
NEEDLE FILTER BLUNT 18X1 1/2 (NEEDLE) ×1 IMPLANT
PACK CATARACT BRASINGTON (MISCELLANEOUS) ×3 IMPLANT
PACK EYE AFTER SURG (MISCELLANEOUS) ×3 IMPLANT
PACK OPTHALMIC (MISCELLANEOUS) ×3 IMPLANT
RING MALYGIN 7.0 (MISCELLANEOUS) IMPLANT
SOL BAL SALT 15ML (MISCELLANEOUS)
SOLUTION BAL SALT 15ML (MISCELLANEOUS) IMPLANT
SUT ETHILON 10-0 CS-B-6CS-B-6 (SUTURE)
SUT VICRYL  9 0 (SUTURE)
SUT VICRYL 9 0 (SUTURE) IMPLANT
SUTURE EHLN 10-0 CS-B-6CS-B-6 (SUTURE) IMPLANT
SYR 3ML LL SCALE MARK (SYRINGE) ×3 IMPLANT
SYR TB 1ML LUER SLIP (SYRINGE) ×3 IMPLANT
WATER STERILE IRR 250ML POUR (IV SOLUTION) ×3 IMPLANT
WATER STERILE IRR 500ML POUR (IV SOLUTION) IMPLANT
WICK EYE OCUCEL (MISCELLANEOUS) IMPLANT
WIPE NON LINTING 3.25X3.25 (MISCELLANEOUS) ×3 IMPLANT

## 2015-12-26 NOTE — Transfer of Care (Signed)
Immediate Anesthesia Transfer of Care Note  Patient: Steve Phillips  Procedure(s) Performed: Procedure(s) with comments: CATARACT EXTRACTION PHACO AND INTRAOCULAR LENS PLACEMENT (IOC) (Right) - DIABETIC - insulin and oral meds PT HAS 830 ARRIVAL TIME PLEASE DO NOT MOVE   Patient Location: PACU  Anesthesia Type: MAC  Level of Consciousness: awake, alert  and patient cooperative  Airway and Oxygen Therapy: Patient Spontanous Breathing and Patient connected to supplemental oxygen  Post-op Assessment: Post-op Vital signs reviewed, Patient's Cardiovascular Status Stable, Respiratory Function Stable, Patent Airway and No signs of Nausea or vomiting  Post-op Vital Signs: Reviewed and stable  Complications: No apparent anesthesia complications

## 2015-12-26 NOTE — Anesthesia Preprocedure Evaluation (Signed)
Anesthesia Evaluation  Patient identified by MRN, date of birth, ID band  Reviewed: Allergy & Precautions, H&P , NPO status , Patient's Chart, lab work & pertinent test results  Airway Mallampati: II  TM Distance: >3 FB Neck ROM: full    Dental no notable dental hx.    Pulmonary shortness of breath, former smoker,    Pulmonary exam normal        Cardiovascular hypertension, + CAD   Rhythm:regular Rate:Normal     Neuro/Psych    GI/Hepatic GERD  ,  Endo/Other  diabetes  Renal/GU      Musculoskeletal   Abdominal   Peds  Hematology   Anesthesia Other Findings   Reproductive/Obstetrics                             Anesthesia Physical Anesthesia Plan  ASA: III  Anesthesia Plan: MAC   Post-op Pain Management:    Induction:   Airway Management Planned:   Additional Equipment:   Intra-op Plan:   Post-operative Plan:   Informed Consent: I have reviewed the patients History and Physical, chart, labs and discussed the procedure including the risks, benefits and alternatives for the proposed anesthesia with the patient or authorized representative who has indicated his/her understanding and acceptance.     Plan Discussed with: CRNA  Anesthesia Plan Comments:         Anesthesia Quick Evaluation

## 2015-12-26 NOTE — Anesthesia Postprocedure Evaluation (Signed)
Anesthesia Post Note  Patient: Steve Phillips  Procedure(s) Performed: Procedure(s) (LRB): CATARACT EXTRACTION PHACO AND INTRAOCULAR LENS PLACEMENT (IOC) (Right)  Patient location during evaluation: PACU Anesthesia Type: MAC Level of consciousness: awake and alert and oriented Pain management: satisfactory to patient Vital Signs Assessment: post-procedure vital signs reviewed and stable Respiratory status: spontaneous breathing, nonlabored ventilation and respiratory function stable Cardiovascular status: blood pressure returned to baseline and stable Postop Assessment: Adequate PO intake and No signs of nausea or vomiting Anesthetic complications: no    Raliegh Ip

## 2015-12-26 NOTE — Op Note (Signed)
Date of Surgery: 12/26/2015  PREOPERATIVE DIAGNOSES: Visually significant nuclear sclerotic cataract, right eye.  POSTOPERATIVE DIAGNOSES: Same  PROCEDURES PERFORMED: Cataract extraction with intraocular lens implant, right eye.  SURGEON: Almon Hercules, M.D.  ANESTHESIA: MAC and topical  IMPLANTS: AcrySof IQ [id number] +21.0D   Implant Name Type Inv. Item Serial No. Manufacturer Lot No. LRB No. Used  IMPLANT LENS - [id number] Intraocular Lens IMPLANT LENS [id number] ALCON   Right 1     COMPLICATIONS: None.  DESCRIPTION OF PROCEDURE: Therapeutic options were discussed with the patient preoperatively, including a discussion of risks and benefits of surgery. Informed consent was obtained. An IOL-Master and immersion biometry were used to take the lens measurements, and a dilated fundus exam was performed within 6 months of the surgical date.  The patient was premedicated and brought to the operating room and placed on the operating table in the supine position. After adequate anesthesia, the patient was prepped and draped in the usual sterile ophthalmic fashion. A wire lid speculum was inserted and the microscope was positioned. A Superblade was used to create a paracentesis site at the limbus and a small amount of dilute preservative free lidocaine was instilled into the anterior chamber, followed by dispersive viscoelastic. A clear corneal incision was created temporally using a 2.4 mm keratome blade. Capsulorrhexis was then performed. In situ phacoemulsification was performed.  Cortical material was removed with the irrigation-aspiration unit. Dispersive viscoelastic was instilled to open the capsular bag. A posterior chamber intraocular lens with the specifications above was inserted and positioned. Irrigation-aspiration was used to remove all viscoelastic. Cefuroxime 1cc was instilled into the anterior chamber, and the corneal incision was checked and found to be water tight. The eyelid  speculum was removed.  The operative eye was covered with protective goggles after instilling 1 drop of timolol and brimonidine. The patient tolerated the procedure well. There were no complications.

## 2015-12-26 NOTE — Anesthesia Procedure Notes (Signed)
Procedure Name: MAC Performed by: Antonyo Hinderer Pre-anesthesia Checklist: Patient identified, Emergency Drugs available, Suction available, Patient being monitored and Timeout performed Patient Re-evaluated:Patient Re-evaluated prior to inductionOxygen Delivery Method: Nasal cannula       

## 2015-12-27 ENCOUNTER — Encounter: Payer: Self-pay | Admitting: Ophthalmology

## 2015-12-28 DIAGNOSIS — M5001 Cervical disc disorder with myelopathy,  high cervical region: Secondary | ICD-10-CM | POA: Diagnosis not present

## 2015-12-28 DIAGNOSIS — K219 Gastro-esophageal reflux disease without esophagitis: Secondary | ICD-10-CM | POA: Diagnosis not present

## 2015-12-28 DIAGNOSIS — E1122 Type 2 diabetes mellitus with diabetic chronic kidney disease: Secondary | ICD-10-CM | POA: Diagnosis not present

## 2015-12-28 DIAGNOSIS — E1142 Type 2 diabetes mellitus with diabetic polyneuropathy: Secondary | ICD-10-CM | POA: Diagnosis not present

## 2016-01-10 DIAGNOSIS — H40003 Preglaucoma, unspecified, bilateral: Secondary | ICD-10-CM | POA: Diagnosis not present

## 2016-01-16 DIAGNOSIS — M545 Low back pain: Secondary | ICD-10-CM | POA: Diagnosis not present

## 2016-01-16 DIAGNOSIS — M6281 Muscle weakness (generalized): Secondary | ICD-10-CM | POA: Diagnosis not present

## 2016-01-16 DIAGNOSIS — E0842 Diabetes mellitus due to underlying condition with diabetic polyneuropathy: Secondary | ICD-10-CM | POA: Diagnosis not present

## 2016-01-18 DIAGNOSIS — E0842 Diabetes mellitus due to underlying condition with diabetic polyneuropathy: Secondary | ICD-10-CM | POA: Diagnosis not present

## 2016-01-18 DIAGNOSIS — M6281 Muscle weakness (generalized): Secondary | ICD-10-CM | POA: Diagnosis not present

## 2016-01-18 DIAGNOSIS — M545 Low back pain: Secondary | ICD-10-CM | POA: Diagnosis not present

## 2016-01-20 DIAGNOSIS — M545 Low back pain: Secondary | ICD-10-CM | POA: Diagnosis not present

## 2016-01-20 DIAGNOSIS — E0842 Diabetes mellitus due to underlying condition with diabetic polyneuropathy: Secondary | ICD-10-CM | POA: Diagnosis not present

## 2016-01-20 DIAGNOSIS — M6281 Muscle weakness (generalized): Secondary | ICD-10-CM | POA: Diagnosis not present

## 2016-01-23 ENCOUNTER — Encounter: Payer: Self-pay | Admitting: Internal Medicine

## 2016-01-23 ENCOUNTER — Ambulatory Visit (INDEPENDENT_AMBULATORY_CARE_PROVIDER_SITE_OTHER): Payer: Medicare Other | Admitting: Internal Medicine

## 2016-01-23 VITALS — BP 159/87 | HR 58 | Temp 97.8°F | Ht 72.0 in | Wt 235.5 lb

## 2016-01-23 DIAGNOSIS — E0842 Diabetes mellitus due to underlying condition with diabetic polyneuropathy: Secondary | ICD-10-CM | POA: Diagnosis not present

## 2016-01-23 DIAGNOSIS — M6281 Muscle weakness (generalized): Secondary | ICD-10-CM | POA: Diagnosis not present

## 2016-01-23 DIAGNOSIS — E1149 Type 2 diabetes mellitus with other diabetic neurological complication: Secondary | ICD-10-CM | POA: Diagnosis not present

## 2016-01-23 DIAGNOSIS — R269 Unspecified abnormalities of gait and mobility: Secondary | ICD-10-CM | POA: Diagnosis not present

## 2016-01-23 DIAGNOSIS — M545 Low back pain: Secondary | ICD-10-CM | POA: Diagnosis not present

## 2016-01-23 DIAGNOSIS — I10 Essential (primary) hypertension: Secondary | ICD-10-CM | POA: Diagnosis not present

## 2016-01-23 LAB — HEMOGLOBIN A1C: HEMOGLOBIN A1C: 7.8 % — AB (ref 4.6–6.5)

## 2016-01-23 LAB — COMPREHENSIVE METABOLIC PANEL
ALBUMIN: 3.6 g/dL (ref 3.5–5.2)
ALK PHOS: 50 U/L (ref 39–117)
ALT: 19 U/L (ref 0–53)
AST: 25 U/L (ref 0–37)
BILIRUBIN TOTAL: 0.4 mg/dL (ref 0.2–1.2)
BUN: 12 mg/dL (ref 6–23)
CO2: 26 mEq/L (ref 19–32)
CREATININE: 0.87 mg/dL (ref 0.40–1.50)
Calcium: 8.9 mg/dL (ref 8.4–10.5)
Chloride: 104 mEq/L (ref 96–112)
GFR: 91.79 mL/min (ref >60.00)
Glucose, Bld: 215 mg/dL — ABNORMAL HIGH (ref 70–99)
POTASSIUM: 4.1 meq/L (ref 3.5–5.1)
SODIUM: 140 meq/L (ref 135–145)
TOTAL PROTEIN: 5.7 g/dL — AB (ref 6.0–8.3)

## 2016-01-23 NOTE — Assessment & Plan Note (Signed)
Will check A1c with labs. He will follow up with Dr. Quita Skye as new PCP. Continue current insulin regimen and Metformin.

## 2016-01-23 NOTE — Assessment & Plan Note (Signed)
Secondary to diabetic neuropathy. Reviewed notes from Dr. Quita Skye at [facility]. He will follow up with her. Continue walker to help with falls prevention.

## 2016-01-23 NOTE — Progress Notes (Signed)
Pre visit review using our clinic review tool, if applicable. No additional management support is needed unless otherwise documented below in the visit note. 

## 2016-01-23 NOTE — Progress Notes (Signed)
Subjective:    Patient ID: Steve Phillips, male    DOB: 1946-05-27, 70 y.o.   MRN: [id number]  HPI  70YO male presents for follow up.  He has recently established care with another PCP at [facility].  She has made several medication changes and is working to eliminate some medications.  DM - BG have been well controlled. Compliant with medication.  He has had recent falls and has been in rehab to work on strengthening. His wife notes occasional periods where he seems not to be paying attention.  He was told by Dr. Quita Skye that he does not have Parkinson's. He is no longer following with neurology.  Wt Readings from Last 3 Encounters:  01/23/16 235 lb 8 oz (106.822 kg)  12/26/15 234 lb (106.142 kg)  10/25/15 239 lb 2 oz (108.466 kg)   BP Readings from Last 3 Encounters:  01/23/16 159/87  12/26/15 179/94  10/25/15 165/79    Past Medical History  Diagnosis Date  . Shingles   . Hyperlipidemia   . Lower leg pain     chronic ,left leg  . Acute angina ([place])   . Depression   . CAD (coronary artery disease)     s/p multiple PCI with stent RCA,LAD and obtuse marginal,followed @ [facility] on the accord study.., cath 02/2012 90% D1, s/p drug-eluting stent Dr. Fletcher [name]  . Shortness of breath   . Hypertension     dr Jerilynn Mages [facility] Acton     labauer in [place]  . Bell's palsy   . Left leg pain   . 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 Type II diabetes mellitus ([place])   . GERD (gastroesophageal reflux disease)   . Headache(784.0)     "sometimes daily; sometimes weekly" (05/16/2015)  . Stroke [facility]) 2015    denies residual on 05/16/2015  . Chronic lower back pain     "for 3 months now" (05/16/2015)  . Diabetic peripheral neuropathy ([facility])     "feet"  . Falls frequently     "in the last month or so" (05/16/2015)  . 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 Lumbar vertebral fracture ([place]) 12/03/15    L2,L3,L4 - currently in rehab facility  . Wears dentures     partial upper and lower   Family  History  Problem Relation Age of Onset  . Cancer Mother     BREAST  . Heart disease Mother     STENT  . Cancer Father     THROAT  . Heart disease Sister     STENT; HTN  . Heart attack Brother   . Hypertension Brother   . Hypertension Sister    Past Surgical History  Procedure Laterality Date  . Appendectomy    . Tibia fracture surgery Left ~ 2002  . Foot fracture surgery Left ~ 2002    "Jone's fracture"  . Testicle surgery Left 2000's    "put drainage tube in for infection"  . Back surgery    . Anterior cervical decomp/discectomy fusion N/A 04/30/2013    Procedure: ANTERIOR CERVICAL DECOMPRESSION/DISCECTOMY FUSION 2 LEVELS;  Surgeon: Faythe Ghee, MD;  Location: [facility] NEURO ORS;  Service: Neurosurgery;  Laterality: N/A;  Cervical four-five, Cervical six-seven anterior cervical decompression fusion with trabecular metal cage and plate  . Left heart catheterization with coronary angiogram N/A 02/23/2015    Procedure: LEFT HEART CATHETERIZATION WITH CORONARY ANGIOGRAM;  Surgeon: [name], MD;  Location: [place] CATH LAB;  Service:  Cardiovascular;  Laterality: N/A;  . Cholecystectomy open  1980's  . Cardiac catheterization  02/2012  . Coronary angioplasty with stent placement  ~ 2000-02/2015    "he has a total of 6 stents" (05/16/2015  . Fracture surgery    . Squamous cell carcinoma excision Left 2014    "arm"  . Cataract extraction w/phaco Right 12/26/2015    Procedure: CATARACT EXTRACTION PHACO AND INTRAOCULAR LENS PLACEMENT (IOC);  Surgeon: Ronnell Freshwater, MD;  Location: [facility];  Service: Ophthalmology;  Laterality: Right;  DIABETIC - insulin and oral meds PT HAS 830 ARRIVAL TIME PLEASE DO NOT MOVE    Social History   Social History  . Marital Status: Married    Spouse Name: N/A  . Number of Children: N/A  . Years of Education: N/A   Social History Main Topics  . Smoking status: Former Smoker -- 0.12 packs/day for 10 years    Types: Cigarettes    Quit  date: 11/08/1971  . Smokeless tobacco: Never Used  . Alcohol Use: No  . Drug Use: No  . Sexual Activity: Not Currently   Other Topics Concern  . None   Social History Narrative   Used to work with [employer] until the 1990s and then became disabled and progressively worse   Currently lives with his wife for 27 years   States he wishes to be a full code    Review of Systems  Constitutional: Negative for fever, chills, activity change, appetite change, fatigue and unexpected weight change.  Eyes: Negative for visual disturbance.  Respiratory: Negative for cough and shortness of breath.   Cardiovascular: Negative for chest pain, palpitations and leg swelling.  Gastrointestinal: Negative for nausea, vomiting, abdominal pain, diarrhea, constipation and abdominal distention.  Genitourinary: Negative for dysuria, urgency and difficulty urinating.  Musculoskeletal: Positive for gait problem. Negative for arthralgias.  Skin: Negative for color change and rash.  Neurological: Positive for weakness and numbness.  Hematological: Negative for adenopathy.  Psychiatric/Behavioral: Negative for sleep disturbance and dysphoric mood. The patient is not nervous/anxious.        Objective:    BP 159/87 mmHg  Pulse 58  Temp(Src) 97.8 F (36.6 C) (Oral)  Ht 6' (1.829 m)  Wt 235 lb 8 oz (106.822 kg)  BMI 31.93 kg/m2  SpO2 96% Physical Exam  Constitutional: He is oriented to person, place, and time. He appears well-developed and well-nourished. No distress.  HENT:  Head: Normocephalic and atraumatic.  Right Ear: External ear normal.  Left Ear: External ear normal.  Nose: Nose normal.  Mouth/Throat: Oropharynx is clear and moist.  Eyes: Conjunctivae and EOM are normal. Pupils are equal, round, and reactive to light. Right eye exhibits no discharge. Left eye exhibits no discharge. No scleral icterus.  Neck: Normal range of motion. Neck supple. No tracheal deviation present. No thyromegaly  present.  Cardiovascular: Normal rate, regular rhythm and normal heart sounds.  Exam reveals no gallop and no friction rub.   No murmur heard. Pulmonary/Chest: Effort normal and breath sounds normal. No accessory muscle usage. No tachypnea. No respiratory distress. He has no decreased breath sounds. He has no wheezes. He has no rhonchi. He has no rales. He exhibits no tenderness.  Musculoskeletal: Normal range of motion. He exhibits no edema.  Lymphadenopathy:    He has no cervical adenopathy.  Neurological: He is alert and oriented to person, place, and time. No cranial nerve deficit. Coordination abnormal.  Skin: Skin is warm and dry. No rash  noted. He is not diaphoretic. No erythema. No pallor.  Psychiatric: He has a normal mood and affect. His behavior is normal. Judgment and thought content normal.          Assessment & Plan:   Problem List Items Addressed This Visit      High   DM (diabetes mellitus), type 2 with neurological complications ([place]) - Primary (Chronic)    Will check A1c with labs. He will follow up with Dr. Quita Skye as new PCP. Continue current insulin regimen and Metformin.      Relevant Orders   Comprehensive metabolic panel   Hemoglobin A1c   Gait abnormality    Secondary to diabetic neuropathy. Reviewed notes from Dr. Quita Skye at [facility]. He will follow up with her. Continue Raylene Carmickle to help with falls prevention.       Hypertension (Chronic)    BP Readings from Last 3 Encounters:  01/23/16 159/87  12/26/15 179/94  10/25/15 165/79   BP improved today. Continue current medications. Renal function with labs.          No Follow-up on file.  [name], MD Internal Medicine [facility]

## 2016-01-23 NOTE — Assessment & Plan Note (Signed)
BP Readings from Last 3 Encounters:  01/23/16 159/87  12/26/15 179/94  10/25/15 165/79   BP improved today. Continue current medications. Renal function with labs.

## 2016-01-23 NOTE — Patient Instructions (Addendum)
Labs today.  Follow up with Dr. Quita Skye at [facility].

## 2016-01-27 DIAGNOSIS — M545 Low back pain: Secondary | ICD-10-CM | POA: Diagnosis not present

## 2016-01-27 DIAGNOSIS — M6281 Muscle weakness (generalized): Secondary | ICD-10-CM | POA: Diagnosis not present

## 2016-01-27 DIAGNOSIS — E0842 Diabetes mellitus due to underlying condition with diabetic polyneuropathy: Secondary | ICD-10-CM | POA: Diagnosis not present

## 2016-01-30 DIAGNOSIS — E0842 Diabetes mellitus due to underlying condition with diabetic polyneuropathy: Secondary | ICD-10-CM | POA: Diagnosis not present

## 2016-01-30 DIAGNOSIS — M6281 Muscle weakness (generalized): Secondary | ICD-10-CM | POA: Diagnosis not present

## 2016-01-30 DIAGNOSIS — M545 Low back pain: Secondary | ICD-10-CM | POA: Diagnosis not present

## 2016-02-01 DIAGNOSIS — E0842 Diabetes mellitus due to underlying condition with diabetic polyneuropathy: Secondary | ICD-10-CM | POA: Diagnosis not present

## 2016-02-01 DIAGNOSIS — M545 Low back pain: Secondary | ICD-10-CM | POA: Diagnosis not present

## 2016-02-01 DIAGNOSIS — M6281 Muscle weakness (generalized): Secondary | ICD-10-CM | POA: Diagnosis not present

## 2016-02-03 DIAGNOSIS — Q141 Congenital malformation of retina: Secondary | ICD-10-CM | POA: Diagnosis not present

## 2016-02-06 DIAGNOSIS — M6281 Muscle weakness (generalized): Secondary | ICD-10-CM | POA: Diagnosis not present

## 2016-02-06 DIAGNOSIS — M545 Low back pain: Secondary | ICD-10-CM | POA: Diagnosis not present

## 2016-02-06 DIAGNOSIS — E0842 Diabetes mellitus due to underlying condition with diabetic polyneuropathy: Secondary | ICD-10-CM | POA: Diagnosis not present

## 2016-02-08 DIAGNOSIS — M545 Low back pain: Secondary | ICD-10-CM | POA: Diagnosis not present

## 2016-02-08 DIAGNOSIS — M6281 Muscle weakness (generalized): Secondary | ICD-10-CM | POA: Diagnosis not present

## 2016-02-08 DIAGNOSIS — E0842 Diabetes mellitus due to underlying condition with diabetic polyneuropathy: Secondary | ICD-10-CM | POA: Diagnosis not present

## 2016-02-10 DIAGNOSIS — M6281 Muscle weakness (generalized): Secondary | ICD-10-CM | POA: Diagnosis not present

## 2016-02-10 DIAGNOSIS — E0842 Diabetes mellitus due to underlying condition with diabetic polyneuropathy: Secondary | ICD-10-CM | POA: Diagnosis not present

## 2016-02-10 DIAGNOSIS — M545 Low back pain: Secondary | ICD-10-CM | POA: Diagnosis not present

## 2016-02-13 ENCOUNTER — Encounter: Payer: Self-pay | Admitting: *Deleted

## 2016-02-13 DIAGNOSIS — E0842 Diabetes mellitus due to underlying condition with diabetic polyneuropathy: Secondary | ICD-10-CM | POA: Diagnosis not present

## 2016-02-13 DIAGNOSIS — M6281 Muscle weakness (generalized): Secondary | ICD-10-CM | POA: Diagnosis not present

## 2016-02-13 DIAGNOSIS — M545 Low back pain: Secondary | ICD-10-CM | POA: Diagnosis not present

## 2016-02-15 DIAGNOSIS — R809 Proteinuria, unspecified: Secondary | ICD-10-CM | POA: Diagnosis not present

## 2016-02-15 DIAGNOSIS — Z9181 History of falling: Secondary | ICD-10-CM | POA: Diagnosis not present

## 2016-02-15 DIAGNOSIS — Z79899 Other long term (current) drug therapy: Secondary | ICD-10-CM | POA: Diagnosis not present

## 2016-02-15 DIAGNOSIS — Z79891 Long term (current) use of opiate analgesic: Secondary | ICD-10-CM | POA: Diagnosis not present

## 2016-02-15 DIAGNOSIS — F3341 Major depressive disorder, recurrent, in partial remission: Secondary | ICD-10-CM | POA: Diagnosis not present

## 2016-02-15 DIAGNOSIS — Z955 Presence of coronary angioplasty implant and graft: Secondary | ICD-10-CM | POA: Diagnosis not present

## 2016-02-15 DIAGNOSIS — Z8673 Personal history of transient ischemic attack (TIA), and cerebral infarction without residual deficits: Secondary | ICD-10-CM | POA: Diagnosis not present

## 2016-02-15 DIAGNOSIS — R600 Localized edema: Secondary | ICD-10-CM | POA: Diagnosis not present

## 2016-02-15 DIAGNOSIS — G4710 Hypersomnia, unspecified: Secondary | ICD-10-CM | POA: Diagnosis not present

## 2016-02-15 DIAGNOSIS — Z794 Long term (current) use of insulin: Secondary | ICD-10-CM | POA: Diagnosis not present

## 2016-02-15 DIAGNOSIS — E1149 Type 2 diabetes mellitus with other diabetic neurological complication: Secondary | ICD-10-CM | POA: Diagnosis not present

## 2016-02-15 DIAGNOSIS — I10 Essential (primary) hypertension: Secondary | ICD-10-CM | POA: Diagnosis not present

## 2016-02-15 DIAGNOSIS — R0683 Snoring: Secondary | ICD-10-CM | POA: Diagnosis not present

## 2016-02-15 DIAGNOSIS — Z7982 Long term (current) use of aspirin: Secondary | ICD-10-CM | POA: Diagnosis not present

## 2016-02-15 DIAGNOSIS — Z87891 Personal history of nicotine dependence: Secondary | ICD-10-CM | POA: Diagnosis not present

## 2016-02-15 DIAGNOSIS — I2510 Atherosclerotic heart disease of native coronary artery without angina pectoris: Secondary | ICD-10-CM | POA: Diagnosis not present

## 2016-02-16 DIAGNOSIS — G4731 Primary central sleep apnea: Secondary | ICD-10-CM | POA: Diagnosis not present

## 2016-02-16 DIAGNOSIS — G4710 Hypersomnia, unspecified: Secondary | ICD-10-CM | POA: Diagnosis not present

## 2016-02-16 DIAGNOSIS — G4733 Obstructive sleep apnea (adult) (pediatric): Secondary | ICD-10-CM | POA: Diagnosis not present

## 2016-02-16 DIAGNOSIS — G4761 Periodic limb movement disorder: Secondary | ICD-10-CM | POA: Diagnosis not present

## 2016-02-16 DIAGNOSIS — R0683 Snoring: Secondary | ICD-10-CM | POA: Diagnosis not present

## 2016-02-16 NOTE — Discharge Instructions (Signed)

## 2016-02-17 DIAGNOSIS — M7541 Impingement syndrome of right shoulder: Secondary | ICD-10-CM | POA: Diagnosis not present

## 2016-02-17 DIAGNOSIS — M5416 Radiculopathy, lumbar region: Secondary | ICD-10-CM | POA: Diagnosis not present

## 2016-02-17 DIAGNOSIS — M5136 Other intervertebral disc degeneration, lumbar region: Secondary | ICD-10-CM | POA: Diagnosis not present

## 2016-02-18 DIAGNOSIS — M545 Low back pain: Secondary | ICD-10-CM | POA: Diagnosis not present

## 2016-02-18 DIAGNOSIS — E0842 Diabetes mellitus due to underlying condition with diabetic polyneuropathy: Secondary | ICD-10-CM | POA: Diagnosis not present

## 2016-02-18 DIAGNOSIS — M6281 Muscle weakness (generalized): Secondary | ICD-10-CM | POA: Diagnosis not present

## 2016-02-20 ENCOUNTER — Encounter: Payer: Self-pay | Admitting: Anesthesiology

## 2016-02-20 ENCOUNTER — Ambulatory Visit: Payer: Medicare Other | Admitting: Anesthesiology

## 2016-02-20 ENCOUNTER — Encounter
Admission: RE | Disposition: A | Discharge status: Home / Self Care | Payer: Self-pay | Source: Ambulatory Visit | Attending: Ophthalmology

## 2016-02-20 ENCOUNTER — Ambulatory Visit
Admission: RE | Admit: 2016-02-20 | Discharge: 2016-02-20 | Disposition: A | Discharge status: Home / Self Care | Payer: Medicare Other | Source: Ambulatory Visit | Attending: Ophthalmology | Admitting: Ophthalmology

## 2016-02-20 DIAGNOSIS — F329 Major depressive disorder, single episode, unspecified: Secondary | ICD-10-CM | POA: Insufficient documentation

## 2016-02-20 DIAGNOSIS — Z79891 Long term (current) use of opiate analgesic: Secondary | ICD-10-CM | POA: Diagnosis not present

## 2016-02-20 DIAGNOSIS — I10 Essential (primary) hypertension: Secondary | ICD-10-CM | POA: Diagnosis not present

## 2016-02-20 DIAGNOSIS — Z85828 Personal history of other malignant neoplasm of skin: Secondary | ICD-10-CM | POA: Insufficient documentation

## 2016-02-20 DIAGNOSIS — Z794 Long term (current) use of insulin: Secondary | ICD-10-CM | POA: Insufficient documentation

## 2016-02-20 DIAGNOSIS — Z885 Allergy status to narcotic agent status: Secondary | ICD-10-CM | POA: Insufficient documentation

## 2016-02-20 DIAGNOSIS — H2512 Age-related nuclear cataract, left eye: Secondary | ICD-10-CM | POA: Insufficient documentation

## 2016-02-20 DIAGNOSIS — Z87891 Personal history of nicotine dependence: Secondary | ICD-10-CM | POA: Diagnosis not present

## 2016-02-20 DIAGNOSIS — Z955 Presence of coronary angioplasty implant and graft: Secondary | ICD-10-CM | POA: Diagnosis not present

## 2016-02-20 DIAGNOSIS — Z9841 Cataract extraction status, right eye: Secondary | ICD-10-CM | POA: Diagnosis not present

## 2016-02-20 DIAGNOSIS — E7800 Pure hypercholesterolemia, unspecified: Secondary | ICD-10-CM | POA: Insufficient documentation

## 2016-02-20 DIAGNOSIS — M1990 Unspecified osteoarthritis, unspecified site: Secondary | ICD-10-CM | POA: Insufficient documentation

## 2016-02-20 DIAGNOSIS — I2510 Atherosclerotic heart disease of native coronary artery without angina pectoris: Secondary | ICD-10-CM | POA: Insufficient documentation

## 2016-02-20 DIAGNOSIS — E1140 Type 2 diabetes mellitus with diabetic neuropathy, unspecified: Secondary | ICD-10-CM | POA: Diagnosis not present

## 2016-02-20 DIAGNOSIS — Z7982 Long term (current) use of aspirin: Secondary | ICD-10-CM | POA: Insufficient documentation

## 2016-02-20 DIAGNOSIS — Z9889 Other specified postprocedural states: Secondary | ICD-10-CM | POA: Diagnosis not present

## 2016-02-20 DIAGNOSIS — Z7902 Long term (current) use of antithrombotics/antiplatelets: Secondary | ICD-10-CM | POA: Diagnosis not present

## 2016-02-20 DIAGNOSIS — Z9049 Acquired absence of other specified parts of digestive tract: Secondary | ICD-10-CM | POA: Insufficient documentation

## 2016-02-20 DIAGNOSIS — Z981 Arthrodesis status: Secondary | ICD-10-CM | POA: Diagnosis not present

## 2016-02-20 DIAGNOSIS — Z79899 Other long term (current) drug therapy: Secondary | ICD-10-CM | POA: Diagnosis not present

## 2016-02-20 DIAGNOSIS — H269 Unspecified cataract: Secondary | ICD-10-CM | POA: Diagnosis present

## 2016-02-20 HISTORY — PX: CATARACT EXTRACTION W/PHACO: SHX586

## 2016-02-20 LAB — GLUCOSE, CAPILLARY
GLUCOSE-CAPILLARY: 280 mg/dL — AB (ref 65–99)
Glucose-Capillary: 280 mg/dL — ABNORMAL HIGH (ref 65–99)

## 2016-02-20 SURGERY — PHACOEMULSIFICATION, CATARACT, WITH IOL INSERTION
Anesthesia: General | Site: Eye | Laterality: Left | Wound class: Clean

## 2016-02-20 MED ORDER — NA HYALUR & NA CHOND-NA HYALUR 0.4-0.35 ML IO KIT
PACK | INTRAOCULAR | Status: DC | PRN
  Administered 2016-02-20: 1 mL via INTRAOCULAR

## 2016-02-20 MED ORDER — FENTANYL CITRATE (PF) 100 MCG/2ML IJ SOLN
INTRAMUSCULAR | Status: DC | PRN
  Administered 2016-02-20: 50 ug via INTRAVENOUS

## 2016-02-20 MED ORDER — LACTATED RINGERS IV SOLN: INTRAVENOUS | Status: DC

## 2016-02-20 MED ORDER — BRIMONIDINE TARTRATE 0.2 % OP SOLN
OPHTHALMIC | Status: DC | PRN
  Administered 2016-02-20: 1 [drp] via OPHTHALMIC

## 2016-02-20 MED ORDER — TIMOLOL MALEATE 0.5 % OP SOLN
OPHTHALMIC | Status: DC | PRN
  Administered 2016-02-20: 1 [drp] via OPHTHALMIC

## 2016-02-20 MED ORDER — EPINEPHRINE HCL 1 MG/ML IJ SOLN
INTRAOCULAR | Status: DC | PRN
  Administered 2016-02-20: 528 mL via OPHTHALMIC

## 2016-02-20 MED ORDER — TETRACAINE HCL 0.5 % OP SOLN
1.0000 [drp] | OPHTHALMIC | Status: DC | PRN
  Administered 2016-02-20: 1 [drp] via OPHTHALMIC

## 2016-02-20 MED ORDER — POVIDONE-IODINE 5 % OP SOLN
1.0000 "application " | OPHTHALMIC | Status: DC | PRN
  Administered 2016-02-20: 1 via OPHTHALMIC

## 2016-02-20 MED ORDER — ARMC OPHTHALMIC DILATING GEL
1.0000 "application " | OPHTHALMIC | Status: DC | PRN
  Administered 2016-02-20 (×2): 1 via OPHTHALMIC

## 2016-02-20 MED ORDER — LIDOCAINE HCL (PF) 4 % IJ SOLN
INTRAOCULAR | Status: DC | PRN
  Administered 2016-02-20: 4 mL via OPHTHALMIC

## 2016-02-20 MED ORDER — ONDANSETRON HCL 4 MG/2ML IJ SOLN
INTRAMUSCULAR | Status: DC | PRN
  Administered 2016-02-20: 4 mg via INTRAVENOUS

## 2016-02-20 MED ORDER — MIDAZOLAM HCL 2 MG/2ML IJ SOLN
INTRAMUSCULAR | Status: DC | PRN
  Administered 2016-02-20 (×2): 1 mg via INTRAVENOUS

## 2016-02-20 SURGICAL SUPPLY — 28 items

## 2016-02-20 NOTE — Op Note (Signed)
Date of Surgery: 02/20/2016  PREOPERATIVE DIAGNOSES: Visually significant nuclear sclerotic cataract, left eye.  POSTOPERATIVE DIAGNOSES: Same  PROCEDURES PERFORMED: Cataract extraction with intraocular lens implant, left eye.  SURGEON: Almon Hercules, M.D.  ANESTHESIA: MAC and topical  IMPLANTS: [id number] +22.0 D   Implant Name Type Inv. Item Serial No. Manufacturer Lot No. LRB No. Used  technis aspheric iol pre-loaded     [id number] [facility]   Left 1    COMPLICATIONS: None.  DESCRIPTION OF PROCEDURE: Therapeutic options were discussed with the patient preoperatively, including a discussion of risks and benefits of surgery. Informed consent was obtained. An IOL-Master and immersion biometry were used to take the lens measurements, and a dilated fundus exam was performed within 6 months of the surgical date.  The patient was premedicated and brought to the operating room and placed on the operating table in the supine position. After adequate anesthesia, the patient was prepped and draped in the usual sterile ophthalmic fashion. A wire lid speculum was inserted and the microscope was positioned. A Superblade was used to create a paracentesis site at the limbus and a small amount of dilute preservative free lidocaine was instilled into the anterior chamber, followed by dispersive viscoelastic. A clear corneal incision was created temporally using a 2.4 mm keratome blade. Capsulorrhexis was then performed. In situ phacoemulsification was performed.  Cortical material was removed with the irrigation-aspiration unit. Dispersive viscoelastic was instilled to open the capsular bag. A posterior chamber intraocular lens with the specifications above was inserted and positioned. Irrigation-aspiration was used to remove all viscoelastic. Cefuroxime 1cc was instilled into the anterior chamber, and the corneal incision was checked and found to be water tight. The eyelid speculum was removed.  The operative  eye was covered with protective goggles after instilling 1 drop of timolol and brimonidine. The patient tolerated the procedure well. There were no complications.

## 2016-02-20 NOTE — Anesthesia Preprocedure Evaluation (Signed)
Anesthesia Evaluation  Patient identified by MRN, date of birth, ID band Patient awake    Reviewed: Allergy & Precautions, H&P , NPO status , Patient's Chart, lab work & pertinent test results, reviewed documented beta blocker date and time   Airway Mallampati: II  TM Distance: >3 FB Neck ROM: full    Dental no notable dental hx.    Pulmonary shortness of breath, former smoker,    Pulmonary exam normal breath sounds clear to auscultation       Cardiovascular Exercise Tolerance: Good hypertension, + CAD  negative cardio ROS   Rhythm:regular Rate:Normal     Neuro/Psych negative neurological ROS  negative psych ROS   GI/Hepatic negative GI ROS, Neg liver ROS, GERD  ,  Endo/Other  negative endocrine ROSdiabetes  Renal/GU negative Renal ROS  negative genitourinary   Musculoskeletal   Abdominal   Peds  Hematology negative hematology ROS (+)   Anesthesia Other Findings   Reproductive/Obstetrics negative OB ROS                             Anesthesia Physical Anesthesia Plan  ASA: II  Anesthesia Plan: General   Post-op Pain Management:    Induction:   Airway Management Planned:   Additional Equipment:   Intra-op Plan:   Post-operative Plan:   Informed Consent: I have reviewed the patients History and Physical, chart, labs and discussed the procedure including the risks, benefits and alternatives for the proposed anesthesia with the patient or authorized representative who has indicated his/her understanding and acceptance.   Dental Advisory Given  Plan Discussed with: CRNA  Anesthesia Plan Comments:         Anesthesia Quick Evaluation

## 2016-02-20 NOTE — Anesthesia Postprocedure Evaluation (Signed)
Anesthesia Post Note  Patient: Steve Phillips  Procedure(s) Performed: Procedure(s) (LRB): CATARACT EXTRACTION PHACO AND INTRAOCULAR LENS PLACEMENT (IOC) (Left)  Patient location during evaluation: PACU Anesthesia Type: MAC Level of consciousness: awake and alert Pain management: pain level controlled Vital Signs Assessment: post-procedure vital signs reviewed and stable Respiratory status: spontaneous breathing, nonlabored ventilation, respiratory function stable and patient connected to nasal cannula oxygen Cardiovascular status: blood pressure returned to baseline and stable Postop Assessment: no signs of nausea or vomiting Anesthetic complications: no    Alisa Graff

## 2016-02-20 NOTE — H&P (Signed)
H+P reviewed and is up to date, please see paper chart.  

## 2016-02-20 NOTE — Transfer of Care (Signed)
Immediate Anesthesia Transfer of Care Note  Patient: Steve Phillips  Procedure(s) Performed: Procedure(s) with comments: CATARACT EXTRACTION PHACO AND INTRAOCULAR LENS PLACEMENT (IOC) (Left) - DIABETIC - insulin and oral meds  Patient Location: PACU  Anesthesia Type: General  Level of Consciousness: awake, alert  and patient cooperative  Airway and Oxygen Therapy: Patient Spontanous Breathing and Patient connected to supplemental oxygen  Post-op Assessment: Post-op Vital signs reviewed, Patient's Cardiovascular Status Stable, Respiratory Function Stable, Patent Airway and No signs of Nausea or vomiting  Post-op Vital Signs: Reviewed and stable  Complications: No apparent anesthesia complications

## 2016-02-20 NOTE — Anesthesia Procedure Notes (Signed)
Procedure Name: MAC Date/Time: 02/20/2016 9:16 AM Performed by: Cameron Ali Pre-anesthesia Checklist: Patient identified, Emergency Drugs available, Suction available, Timeout performed and Patient being monitored Patient Re-evaluated:Patient Re-evaluated prior to inductionOxygen Delivery Method: Nasal cannula Placement Confirmation: positive ETCO2

## 2016-02-24 DIAGNOSIS — E0842 Diabetes mellitus due to underlying condition with diabetic polyneuropathy: Secondary | ICD-10-CM | POA: Diagnosis not present

## 2016-02-24 DIAGNOSIS — M545 Low back pain: Secondary | ICD-10-CM | POA: Diagnosis not present

## 2016-02-24 DIAGNOSIS — M6281 Muscle weakness (generalized): Secondary | ICD-10-CM | POA: Diagnosis not present

## 2016-02-27 DIAGNOSIS — M545 Low back pain: Secondary | ICD-10-CM | POA: Diagnosis not present

## 2016-02-27 DIAGNOSIS — E0842 Diabetes mellitus due to underlying condition with diabetic polyneuropathy: Secondary | ICD-10-CM | POA: Diagnosis not present

## 2016-02-27 DIAGNOSIS — M6281 Muscle weakness (generalized): Secondary | ICD-10-CM | POA: Diagnosis not present

## 2016-02-29 DIAGNOSIS — E0842 Diabetes mellitus due to underlying condition with diabetic polyneuropathy: Secondary | ICD-10-CM | POA: Diagnosis not present

## 2016-02-29 DIAGNOSIS — M6281 Muscle weakness (generalized): Secondary | ICD-10-CM | POA: Diagnosis not present

## 2016-02-29 DIAGNOSIS — M545 Low back pain: Secondary | ICD-10-CM | POA: Diagnosis not present

## 2016-03-05 DIAGNOSIS — E0842 Diabetes mellitus due to underlying condition with diabetic polyneuropathy: Secondary | ICD-10-CM | POA: Diagnosis not present

## 2016-03-05 DIAGNOSIS — D2371 Other benign neoplasm of skin of right lower limb, including hip: Secondary | ICD-10-CM | POA: Diagnosis not present

## 2016-03-05 DIAGNOSIS — I739 Peripheral vascular disease, unspecified: Secondary | ICD-10-CM | POA: Diagnosis not present

## 2016-03-05 DIAGNOSIS — M6281 Muscle weakness (generalized): Secondary | ICD-10-CM | POA: Diagnosis not present

## 2016-03-05 DIAGNOSIS — E1142 Type 2 diabetes mellitus with diabetic polyneuropathy: Secondary | ICD-10-CM | POA: Diagnosis not present

## 2016-03-05 DIAGNOSIS — M545 Low back pain: Secondary | ICD-10-CM | POA: Diagnosis not present

## 2016-03-05 DIAGNOSIS — Z794 Long term (current) use of insulin: Secondary | ICD-10-CM | POA: Diagnosis not present

## 2016-03-07 DIAGNOSIS — E0842 Diabetes mellitus due to underlying condition with diabetic polyneuropathy: Secondary | ICD-10-CM | POA: Diagnosis not present

## 2016-03-07 DIAGNOSIS — M6281 Muscle weakness (generalized): Secondary | ICD-10-CM | POA: Diagnosis not present

## 2016-03-07 DIAGNOSIS — M545 Low back pain: Secondary | ICD-10-CM | POA: Diagnosis not present

## 2016-03-08 DIAGNOSIS — R809 Proteinuria, unspecified: Secondary | ICD-10-CM | POA: Diagnosis not present

## 2016-03-09 DIAGNOSIS — E0842 Diabetes mellitus due to underlying condition with diabetic polyneuropathy: Secondary | ICD-10-CM | POA: Diagnosis not present

## 2016-03-09 DIAGNOSIS — M6281 Muscle weakness (generalized): Secondary | ICD-10-CM | POA: Diagnosis not present

## 2016-03-09 DIAGNOSIS — M545 Low back pain: Secondary | ICD-10-CM | POA: Diagnosis not present

## 2016-03-12 DIAGNOSIS — E0842 Diabetes mellitus due to underlying condition with diabetic polyneuropathy: Secondary | ICD-10-CM | POA: Diagnosis not present

## 2016-03-12 DIAGNOSIS — M6281 Muscle weakness (generalized): Secondary | ICD-10-CM | POA: Diagnosis not present

## 2016-03-12 DIAGNOSIS — M545 Low back pain: Secondary | ICD-10-CM | POA: Diagnosis not present

## 2016-03-16 DIAGNOSIS — G4733 Obstructive sleep apnea (adult) (pediatric): Secondary | ICD-10-CM | POA: Diagnosis not present

## 2016-03-19 DIAGNOSIS — M545 Low back pain: Secondary | ICD-10-CM | POA: Diagnosis not present

## 2016-03-19 DIAGNOSIS — M6281 Muscle weakness (generalized): Secondary | ICD-10-CM | POA: Diagnosis not present

## 2016-03-19 DIAGNOSIS — E0842 Diabetes mellitus due to underlying condition with diabetic polyneuropathy: Secondary | ICD-10-CM | POA: Diagnosis not present

## 2016-03-21 ENCOUNTER — Other Ambulatory Visit: Payer: Self-pay | Admitting: Internal Medicine

## 2016-03-22 ENCOUNTER — Encounter: Payer: Self-pay | Admitting: Ophthalmology

## 2016-03-27 DIAGNOSIS — M545 Low back pain: Secondary | ICD-10-CM | POA: Diagnosis not present

## 2016-03-27 DIAGNOSIS — M6281 Muscle weakness (generalized): Secondary | ICD-10-CM | POA: Diagnosis not present

## 2016-03-27 DIAGNOSIS — E0842 Diabetes mellitus due to underlying condition with diabetic polyneuropathy: Secondary | ICD-10-CM | POA: Diagnosis not present

## 2016-03-28 DIAGNOSIS — E1149 Type 2 diabetes mellitus with other diabetic neurological complication: Secondary | ICD-10-CM | POA: Diagnosis not present

## 2016-03-28 DIAGNOSIS — M6281 Muscle weakness (generalized): Secondary | ICD-10-CM | POA: Diagnosis not present

## 2016-03-28 DIAGNOSIS — G4733 Obstructive sleep apnea (adult) (pediatric): Secondary | ICD-10-CM | POA: Diagnosis not present

## 2016-03-28 DIAGNOSIS — I10 Essential (primary) hypertension: Secondary | ICD-10-CM | POA: Diagnosis not present

## 2016-03-28 DIAGNOSIS — G3184 Mild cognitive impairment, so stated: Secondary | ICD-10-CM | POA: Diagnosis not present

## 2016-03-28 DIAGNOSIS — Z9181 History of falling: Secondary | ICD-10-CM | POA: Diagnosis not present

## 2016-03-28 DIAGNOSIS — F3341 Major depressive disorder, recurrent, in partial remission: Secondary | ICD-10-CM | POA: Diagnosis not present

## 2016-03-28 DIAGNOSIS — Z794 Long term (current) use of insulin: Secondary | ICD-10-CM | POA: Diagnosis not present

## 2016-03-29 DIAGNOSIS — M545 Low back pain: Secondary | ICD-10-CM | POA: Diagnosis not present

## 2016-03-29 DIAGNOSIS — E0842 Diabetes mellitus due to underlying condition with diabetic polyneuropathy: Secondary | ICD-10-CM | POA: Diagnosis not present

## 2016-03-29 DIAGNOSIS — M6281 Muscle weakness (generalized): Secondary | ICD-10-CM | POA: Diagnosis not present

## 2016-04-03 DIAGNOSIS — E0842 Diabetes mellitus due to underlying condition with diabetic polyneuropathy: Secondary | ICD-10-CM | POA: Diagnosis not present

## 2016-04-03 DIAGNOSIS — M6281 Muscle weakness (generalized): Secondary | ICD-10-CM | POA: Diagnosis not present

## 2016-04-03 DIAGNOSIS — M545 Low back pain: Secondary | ICD-10-CM | POA: Diagnosis not present

## 2016-04-04 DIAGNOSIS — M4802 Spinal stenosis, cervical region: Secondary | ICD-10-CM | POA: Diagnosis not present

## 2016-04-04 DIAGNOSIS — M6281 Muscle weakness (generalized): Secondary | ICD-10-CM | POA: Diagnosis not present

## 2016-04-04 DIAGNOSIS — Z9889 Other specified postprocedural states: Secondary | ICD-10-CM | POA: Diagnosis not present

## 2016-04-04 DIAGNOSIS — M5021 Other cervical disc displacement,  high cervical region: Secondary | ICD-10-CM | POA: Diagnosis not present

## 2016-04-04 DIAGNOSIS — M47812 Spondylosis without myelopathy or radiculopathy, cervical region: Secondary | ICD-10-CM | POA: Diagnosis not present

## 2016-04-05 ENCOUNTER — Other Ambulatory Visit: Payer: Self-pay | Admitting: Internal Medicine

## 2016-04-12 DIAGNOSIS — E0842 Diabetes mellitus due to underlying condition with diabetic polyneuropathy: Secondary | ICD-10-CM | POA: Diagnosis not present

## 2016-04-12 DIAGNOSIS — M6281 Muscle weakness (generalized): Secondary | ICD-10-CM | POA: Diagnosis not present

## 2016-04-12 DIAGNOSIS — M545 Low back pain: Secondary | ICD-10-CM | POA: Diagnosis not present

## 2016-04-17 DIAGNOSIS — E785 Hyperlipidemia, unspecified: Secondary | ICD-10-CM | POA: Diagnosis not present

## 2016-04-17 DIAGNOSIS — G629 Polyneuropathy, unspecified: Secondary | ICD-10-CM | POA: Diagnosis not present

## 2016-04-17 DIAGNOSIS — G20 Parkinson's disease: Secondary | ICD-10-CM | POA: Diagnosis not present

## 2016-04-17 DIAGNOSIS — R262 Difficulty in walking, not elsewhere classified: Secondary | ICD-10-CM | POA: Diagnosis not present

## 2016-04-17 DIAGNOSIS — G8929 Other chronic pain: Secondary | ICD-10-CM | POA: Diagnosis not present

## 2016-04-17 DIAGNOSIS — E1142 Type 2 diabetes mellitus with diabetic polyneuropathy: Secondary | ICD-10-CM | POA: Diagnosis not present

## 2016-04-17 DIAGNOSIS — D519 Vitamin B12 deficiency anemia, unspecified: Secondary | ICD-10-CM | POA: Diagnosis not present

## 2016-04-17 DIAGNOSIS — R296 Repeated falls: Secondary | ICD-10-CM | POA: Diagnosis not present

## 2016-04-17 DIAGNOSIS — F0390 Unspecified dementia without behavioral disturbance: Secondary | ICD-10-CM | POA: Diagnosis not present

## 2016-04-17 DIAGNOSIS — Z87891 Personal history of nicotine dependence: Secondary | ICD-10-CM | POA: Diagnosis not present

## 2016-04-17 DIAGNOSIS — I2510 Atherosclerotic heart disease of native coronary artery without angina pectoris: Secondary | ICD-10-CM | POA: Diagnosis not present

## 2016-04-17 DIAGNOSIS — M6281 Muscle weakness (generalized): Secondary | ICD-10-CM | POA: Diagnosis not present

## 2016-04-17 DIAGNOSIS — M25511 Pain in right shoulder: Secondary | ICD-10-CM | POA: Diagnosis not present

## 2016-04-17 DIAGNOSIS — I10 Essential (primary) hypertension: Secondary | ICD-10-CM | POA: Diagnosis not present

## 2016-04-17 DIAGNOSIS — D649 Anemia, unspecified: Secondary | ICD-10-CM | POA: Diagnosis not present

## 2016-04-17 DIAGNOSIS — R269 Unspecified abnormalities of gait and mobility: Secondary | ICD-10-CM | POA: Diagnosis not present

## 2016-04-17 DIAGNOSIS — E538 Deficiency of other specified B group vitamins: Secondary | ICD-10-CM | POA: Diagnosis not present

## 2016-04-19 DIAGNOSIS — M545 Low back pain: Secondary | ICD-10-CM | POA: Diagnosis not present

## 2016-04-19 DIAGNOSIS — M6281 Muscle weakness (generalized): Secondary | ICD-10-CM | POA: Diagnosis not present

## 2016-04-19 DIAGNOSIS — E0842 Diabetes mellitus due to underlying condition with diabetic polyneuropathy: Secondary | ICD-10-CM | POA: Diagnosis not present

## 2016-04-20 DIAGNOSIS — R809 Proteinuria, unspecified: Secondary | ICD-10-CM | POA: Diagnosis not present

## 2016-04-24 DIAGNOSIS — H1132 Conjunctival hemorrhage, left eye: Secondary | ICD-10-CM | POA: Diagnosis not present

## 2016-04-24 DIAGNOSIS — R809 Proteinuria, unspecified: Secondary | ICD-10-CM | POA: Diagnosis not present

## 2016-04-25 DIAGNOSIS — M6281 Muscle weakness (generalized): Secondary | ICD-10-CM | POA: Diagnosis not present

## 2016-04-25 DIAGNOSIS — E0842 Diabetes mellitus due to underlying condition with diabetic polyneuropathy: Secondary | ICD-10-CM | POA: Diagnosis not present

## 2016-04-25 DIAGNOSIS — M545 Low back pain: Secondary | ICD-10-CM | POA: Diagnosis not present

## 2016-04-26 ENCOUNTER — Encounter: Payer: Self-pay | Admitting: Cardiovascular Disease

## 2016-04-26 ENCOUNTER — Ambulatory Visit (INDEPENDENT_AMBULATORY_CARE_PROVIDER_SITE_OTHER): Payer: Medicare Other | Admitting: Cardiovascular Disease

## 2016-04-26 VITALS — BP 132/76 | HR 63 | Ht 72.0 in | Wt 229.0 lb

## 2016-04-26 DIAGNOSIS — I10 Essential (primary) hypertension: Secondary | ICD-10-CM

## 2016-04-26 DIAGNOSIS — I2510 Atherosclerotic heart disease of native coronary artery without angina pectoris: Secondary | ICD-10-CM

## 2016-04-26 DIAGNOSIS — E785 Hyperlipidemia, unspecified: Secondary | ICD-10-CM

## 2016-04-26 NOTE — Patient Instructions (Signed)
Medication Instructions: Continue same medications.   Labwork: None.   Procedures/Testing: None.   Follow-Up: 6 months with Dr. Fletcher [name]  Any Additional Special Instructions Will Be Listed Below (If Applicable).  If you need any invasive procedures, you can hold Plavix 7 days before.    If you need a refill on your cardiac medications before your next appointment, please call your pharmacy.

## 2016-04-26 NOTE — Progress Notes (Signed)
Cardiology Office Note   Date:  04/26/2016   ID:  Steve Phillips, Steve Phillips 09/22/1946, MRN [id number]  PCP:  Steve Phillips  Cardiologist:   Steve Sacramento, MD   Chief Complaint  Patient presents with  . other    6 month F/U. medications verbally reviewed with patient.       History of Present Illness: Steve Phillips is a 70 y.o. male who presents for a followup visit regarding coronary artery disease. He status post multiple PCI in the past.  he has known history of refractory hypertension. Renal artery duplex ultrasound showed no evidence of renal artery stenosis. He has extensive medical problems that include diabetes mellitus, hyperlipidemia, obesity, essential hypertension and peripheral neuropathy. Most recent cardiac catheterization was done in April 2016 for worsening angina which showed patent stents in the RCA, left circumflex and first diagonal. There was severe in-stent restenosis in the mid LAD. I performed successful angioplasty and drug-eluting stent placement to the mid LAD. The patient was noted to have diffuse diabetic vessels.  No cardiac events since then. However, he has suffered from back pain, poor balance and recurrent falls. This is thought to be due to severe peripheral neuropathy with possible Parkinson's. He was hospitalized in January at [facility] after a mechanical fall that was complicated by an L2, L4 fractures. He was treated conservatively. He was also diagnosed with nephrotic range proteinuria with some discussions about renal biopsy. He is now followed at [facility]. He reports no chest pain. Exertional dyspnea is stable. He continues to have significant leg edema.   Past Medical History  Diagnosis Date  . Shingles   . Hyperlipidemia   . Lower leg pain     chronic ,left leg  . Acute angina ([place])   . Depression   . CAD (coronary artery disease)     s/p multiple PCI with stent RCA,LAD and obtuse marginal,followed @ [facility] on the accord study.., cath 02/2012 90%  D1, s/p drug-eluting stent Steve Phillips  . Shortness of breath   . Hypertension     dr Steve Phillips     [facility] in [place]  . Bell's palsy   . Left leg pain   . 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 Type II diabetes mellitus ([place])   . GERD (gastroesophageal reflux disease)   . Headache(784.0)     "sometimes daily; sometimes weekly" (05/16/2015)  . Stroke [facility]) 2015    denies residual on 05/16/2015  . Chronic lower back pain     "for 3 months now" (05/16/2015)  . Diabetic peripheral neuropathy ([facility])     "feet"  . Falls frequently     "in the last month or so" (05/16/2015)  . 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 Lumbar vertebral fracture ([place]) 12/03/15    L2,L3,L4 - currently in rehab facility  . Wears dentures     partial upper and lower    Past Surgical History  Procedure Laterality Date  . Appendectomy    . Tibia fracture surgery Left ~ 2002  . Foot fracture surgery Left ~ 2002    "Jone's fracture"  . Testicle surgery Left 2000's    "put drainage tube in for infection"  . Back surgery    . Anterior cervical decomp/discectomy fusion N/A 04/30/2013    Procedure: ANTERIOR CERVICAL DECOMPRESSION/DISCECTOMY FUSION 2 LEVELS;  Surgeon: Steve Ghee, MD;  Location: [facility] NEURO [facility]S;  Service: Neurosurgery;  Laterality: N/A;  Cervical four-five,  Cervical six-seven anterior cervical decompression fusion with trabecular metal cage and plate  . Left heart catheterization with coronary angiogram N/A 02/23/2015    Procedure: LEFT HEART CATHETERIZATION WITH CORONARY ANGIOGRAM;  Surgeon: Steve Hampshire, MD;  Location: [place] CATH LAB;  Service: Cardiovascular;  Laterality: N/A;  . Cholecystectomy open  1980's  . Cardiac catheterization  02/2012  . Coronary angioplasty with stent placement  ~ 2000-02/2015    "he has a total of 6 stents" (05/16/2015  . Fracture surgery    . Squamous cell carcinoma excision Left 2014    "arm"  . Cataract extraction w/phaco Right 12/26/2015    Procedure:  CATARACT EXTRACTION PHACO AND INTRAOCULAR LENS PLACEMENT (IOC);  Surgeon: Steve Freshwater, MD;  Location: [place];  Service: Ophthalmology;  Laterality: Right;  DIABETIC - insulin and oral meds PT HAS 830 ARRIVAL TIME PLEASE DO NOT MOVE   . Cataract extraction w/phaco Left 02/20/2016    Procedure: CATARACT EXTRACTION PHACO AND INTRAOCULAR LENS PLACEMENT (IOC);  Surgeon: Steve Freshwater, MD;  Location: [place];  Service: Ophthalmology;  Laterality: Left;  DIABETIC - insulin and oral meds     Current Outpatient Prescriptions  Medication Sig Dispense Refill  . acetaminophen (TYLENOL) 325 MG tablet Take 2 tablets (650 mg total) by mouth every 4 (four) hours as needed for headache or mild pain.    Marland Kitchen aspirin EC 81 MG tablet Take 1 tablet (81 mg total) by mouth daily.    . carvedilol (COREG) 25 MG tablet Take 1 tablet (25 mg total) by mouth 2 (two) times daily. 180 tablet 3  . clopidogrel (PLAVIX) 75 MG tablet Take 1 tablet by mouth  daily with breakfast 90 tablet 1  . cyanocobalamin (,VITAMIN B-12,) 1000 MCG/ML injection Inject 1,000 mcg into the muscle every 30 (thirty) days. Reported on 02/13/2016    . DULoxetine (CYMBALTA) 60 MG capsule TAKE 1 CAPSULE BY MOUTH  DAILY. 90 capsule 1  . gabapentin (NEURONTIN) 300 MG capsule Take 1 capsule (300 mg total) by mouth 2 (two) times daily. (Patient taking differently: Take 300 mg by mouth 3 (three) times daily. ) 180 capsule 1  . insulin aspart (NOVOLOG) 100 UNIT/ML injection Inject 15 Units into the skin 3 (three) times daily with meals. Before breakfast    . insulin glargine (LANTUS) 100 UNIT/ML injection Inject 60 Units into the skin 2 (two) times daily.     Marland Kitchen lisinopril-hydrochlorothiazide (PRINZIDE,ZESTORETIC) 20-12.5 MG tablet Take 1 tablet by mouth 2 (two) times daily. (Patient taking differently: Take 2 tablets by mouth daily. ) 180 tablet 1  . metFORMIN (GLUCOPHAGE) 1000 MG tablet TAKE ONE TABLET BY MOUTH TWICE  DAILY 180 tablet 0  . nitroGLYCERIN (NITROSTAT) 0.4 MG SL tablet Place 1 tablet (0.4 mg total) under the tongue every 5 (five) minutes as needed for chest pain. 30 tablet 6  . OxyCODONE HCl, Abuse Deter, (OXAYDO) 5 MG TABA Take 5 mg by mouth every 6 (six) hours as needed.    . pantoprazole (PROTONIX) 40 MG tablet Take 1 tablet (40 mg total) by mouth 2 (two) times daily. (Patient taking differently: Take 40 mg by mouth daily. ) 180 tablet 3  . simvastatin (ZOCOR) 20 MG tablet Take 1 tablet (20 mg total) by mouth at bedtime. 90 tablet 3  . traZODone (DESYREL) 50 MG tablet Take 12.5 mg by mouth at bedtime.     . Ferrous Sulfate (IRON) 325 (65 Fe) MG TABS Take by mouth 2 (two) times  daily. Reported on 04/26/2016    . magnesium citrate SOLN Take 1 Bottle by mouth daily as needed for severe constipation. Reported on 04/26/2016     No current facility-administered medications for this visit.    Allergies:   Morphine and related; Codeine; Fentanyl; Tramadol; Trazodone; and Trazodone and nefazodone    Social History:  The patient  reports that he quit smoking about 44 years ago. His smoking use included Cigarettes. He has a 1.2 pack-year smoking history. He has never used smokeless tobacco. He reports that he does not drink alcohol or use illicit drugs.   Family History:  The patient's family history includes Cancer in his father and mother; Heart attack in his brother; Heart disease in his mother and sister; Hypertension in his brother and sister.    ROS:  Please see the history of present illness.   Otherwise, review of systems are positive for none.   All other systems are reviewed and negative.    PHYSICAL EXAM: VS:  BP 132/76 mmHg  Pulse 63  Ht 6' (1.829 m)  Wt 229 lb (103.874 kg)  BMI 31.05 kg/m2  SpO2 98% , BMI Body mass index is 31.05 kg/(m^2). GEN: Well nourished, well developed, in no acute distress HEENT: normal Neck: no JVD, or masses Cardiac: RRR; no rubs, or gallops, moderate leg  edema worse on the left side Respiratory:  clear to auscultation bilaterally, normal work of breathing GI: soft, nontender, nondistended, + BS  MS: no deformity or atrophy Skin: warm and dry, no rash Neuro:  Strength and sensation are intact Psych: euthymic mood, full affect   EKG:  EKG is not ordered today.    Recent Labs: 07/21/2015: Hemoglobin 13.8; Platelets 263.0 01/23/2016: ALT 19; BUN 12; Creatinine, Ser 0.87; Potassium 4.1; Sodium 140    Lipid Panel    Component Value Date/Time   CHOL 94 03/05/2015 0116   CHOL 189 11/16/2014 1424   TRIG 70 03/05/2015 0116   TRIG 306.0* 11/16/2014 1424   HDL 30* 03/05/2015 0116   HDL 38.90* 11/16/2014 1424   CHOLHDL 5 11/16/2014 1424   VLDL 14 03/05/2015 0116   VLDL 61.2* 11/16/2014 1424   LDLCALC 50 03/05/2015 0116   LDLCALC 25 05/14/2014 1625   LDLDIRECT 116.0 11/16/2014 1424      Wt Readings from Last 3 Encounters:  04/26/16 229 lb (103.874 kg)  02/20/16 233 lb (105.688 kg)  01/23/16 235 lb 8 oz (106.822 kg)      Other studies Reviewed: Additional studies/ records that were reviewed today include:  Records from admission to [facility]. Review of the above records demonstrates: Summarized above   ASSESSMENT AND PLAN:  1.  Coronary artery disease involving native coronary arteries without angina: Overall decreased stable from a cardiac standpoint. It has been more than one year since his most recent drug-eluting stent placement. Thus, Plavix can be held 7 days before procedures if needed. There might be a need for renal biopsy. The medication should be resumed after as I prefer him to be on long-term dual antiplatelet therapy given his multiple stenting and diffuse coronary artery disease.  2. Essential hypertension: Blood pressure is controlled on current medications.  3. Hyperlipidemia: Continue treatment with simvastatin. Most recent lipid profile in April 2016 showed an LDL of 50.  4. Obesity: This is definitely contributing  to symptoms of fatigue and shortness of breath. Unfortunately, his activities have been limited by back pain and neuropathy.    Disposition:   FU with me  in 6 months  Signed,  Steve Sacramento, MD  04/26/2016 2:29 PM    [place]

## 2016-04-27 DIAGNOSIS — M545 Low back pain: Secondary | ICD-10-CM | POA: Diagnosis not present

## 2016-04-27 DIAGNOSIS — M6281 Muscle weakness (generalized): Secondary | ICD-10-CM | POA: Diagnosis not present

## 2016-04-27 DIAGNOSIS — E0842 Diabetes mellitus due to underlying condition with diabetic polyneuropathy: Secondary | ICD-10-CM | POA: Diagnosis not present

## 2016-05-02 DIAGNOSIS — M19011 Primary osteoarthritis, right shoulder: Secondary | ICD-10-CM | POA: Diagnosis not present

## 2016-05-02 DIAGNOSIS — G8929 Other chronic pain: Secondary | ICD-10-CM | POA: Diagnosis not present

## 2016-05-02 DIAGNOSIS — Z794 Long term (current) use of insulin: Secondary | ICD-10-CM | POA: Diagnosis not present

## 2016-05-02 DIAGNOSIS — D2371 Other benign neoplasm of skin of right lower limb, including hip: Secondary | ICD-10-CM | POA: Diagnosis not present

## 2016-05-02 DIAGNOSIS — E1142 Type 2 diabetes mellitus with diabetic polyneuropathy: Secondary | ICD-10-CM | POA: Diagnosis not present

## 2016-05-02 DIAGNOSIS — G3184 Mild cognitive impairment, so stated: Secondary | ICD-10-CM | POA: Diagnosis not present

## 2016-05-02 DIAGNOSIS — E1149 Type 2 diabetes mellitus with other diabetic neurological complication: Secondary | ICD-10-CM | POA: Diagnosis not present

## 2016-05-02 DIAGNOSIS — G4733 Obstructive sleep apnea (adult) (pediatric): Secondary | ICD-10-CM | POA: Diagnosis not present

## 2016-05-02 DIAGNOSIS — I10 Essential (primary) hypertension: Secondary | ICD-10-CM | POA: Diagnosis not present

## 2016-05-02 DIAGNOSIS — B351 Tinea unguium: Secondary | ICD-10-CM | POA: Diagnosis not present

## 2016-05-02 DIAGNOSIS — I2510 Atherosclerotic heart disease of native coronary artery without angina pectoris: Secondary | ICD-10-CM | POA: Diagnosis not present

## 2016-05-02 DIAGNOSIS — G629 Polyneuropathy, unspecified: Secondary | ICD-10-CM | POA: Diagnosis not present

## 2016-05-02 DIAGNOSIS — M25511 Pain in right shoulder: Secondary | ICD-10-CM | POA: Diagnosis not present

## 2016-05-03 DIAGNOSIS — M6281 Muscle weakness (generalized): Secondary | ICD-10-CM | POA: Diagnosis not present

## 2016-05-03 DIAGNOSIS — E0842 Diabetes mellitus due to underlying condition with diabetic polyneuropathy: Secondary | ICD-10-CM | POA: Diagnosis not present

## 2016-05-03 DIAGNOSIS — M545 Low back pain: Secondary | ICD-10-CM | POA: Diagnosis not present

## 2016-05-08 DIAGNOSIS — E0842 Diabetes mellitus due to underlying condition with diabetic polyneuropathy: Secondary | ICD-10-CM | POA: Diagnosis not present

## 2016-05-08 DIAGNOSIS — M545 Low back pain: Secondary | ICD-10-CM | POA: Diagnosis not present

## 2016-05-08 DIAGNOSIS — M6281 Muscle weakness (generalized): Secondary | ICD-10-CM | POA: Diagnosis not present

## 2016-05-10 DIAGNOSIS — M545 Low back pain: Secondary | ICD-10-CM | POA: Diagnosis not present

## 2016-05-10 DIAGNOSIS — M6281 Muscle weakness (generalized): Secondary | ICD-10-CM | POA: Diagnosis not present

## 2016-05-10 DIAGNOSIS — E0842 Diabetes mellitus due to underlying condition with diabetic polyneuropathy: Secondary | ICD-10-CM | POA: Diagnosis not present

## 2016-05-15 ENCOUNTER — Other Ambulatory Visit: Payer: Self-pay | Admitting: Physical Medicine and Rehabilitation

## 2016-05-15 DIAGNOSIS — M5136 Other intervertebral disc degeneration, lumbar region: Secondary | ICD-10-CM | POA: Diagnosis not present

## 2016-05-15 DIAGNOSIS — M7541 Impingement syndrome of right shoulder: Secondary | ICD-10-CM | POA: Diagnosis not present

## 2016-05-15 DIAGNOSIS — M19011 Primary osteoarthritis, right shoulder: Secondary | ICD-10-CM | POA: Diagnosis not present

## 2016-05-15 DIAGNOSIS — G540 Brachial plexus disorders: Secondary | ICD-10-CM

## 2016-05-15 DIAGNOSIS — M5416 Radiculopathy, lumbar region: Secondary | ICD-10-CM | POA: Diagnosis not present

## 2016-05-17 DIAGNOSIS — E0842 Diabetes mellitus due to underlying condition with diabetic polyneuropathy: Secondary | ICD-10-CM | POA: Diagnosis not present

## 2016-05-17 DIAGNOSIS — M545 Low back pain: Secondary | ICD-10-CM | POA: Diagnosis not present

## 2016-05-17 DIAGNOSIS — M6281 Muscle weakness (generalized): Secondary | ICD-10-CM | POA: Diagnosis not present

## 2016-05-29 DIAGNOSIS — M6281 Muscle weakness (generalized): Secondary | ICD-10-CM | POA: Diagnosis not present

## 2016-05-29 DIAGNOSIS — M545 Low back pain: Secondary | ICD-10-CM | POA: Diagnosis not present

## 2016-05-29 DIAGNOSIS — E0842 Diabetes mellitus due to underlying condition with diabetic polyneuropathy: Secondary | ICD-10-CM | POA: Diagnosis not present

## 2016-05-31 DIAGNOSIS — M6281 Muscle weakness (generalized): Secondary | ICD-10-CM | POA: Diagnosis not present

## 2016-05-31 DIAGNOSIS — M545 Low back pain: Secondary | ICD-10-CM | POA: Diagnosis not present

## 2016-05-31 DIAGNOSIS — E0842 Diabetes mellitus due to underlying condition with diabetic polyneuropathy: Secondary | ICD-10-CM | POA: Diagnosis not present

## 2016-06-01 ENCOUNTER — Other Ambulatory Visit
Admission: RE | Admit: 2016-06-01 | Discharge: 2016-06-01 | Disposition: A | Discharge status: Home / Self Care | Payer: Medicare Other | Source: Ambulatory Visit | Attending: Physical Medicine and Rehabilitation | Admitting: Physical Medicine and Rehabilitation

## 2016-06-01 ENCOUNTER — Ambulatory Visit
Admission: RE | Admit: 2016-06-01 | Discharge: 2016-06-01 | Disposition: A | Discharge status: Home / Self Care | Payer: Medicare Other | Source: Ambulatory Visit | Attending: Physical Medicine and Rehabilitation | Admitting: Physical Medicine and Rehabilitation

## 2016-06-01 DIAGNOSIS — G540 Brachial plexus disorders: Secondary | ICD-10-CM

## 2016-06-01 DIAGNOSIS — M19011 Primary osteoarthritis, right shoulder: Secondary | ICD-10-CM | POA: Diagnosis not present

## 2016-06-01 DIAGNOSIS — I10 Essential (primary) hypertension: Secondary | ICD-10-CM | POA: Diagnosis not present

## 2016-06-01 DIAGNOSIS — E119 Type 2 diabetes mellitus without complications: Secondary | ICD-10-CM | POA: Diagnosis not present

## 2016-06-01 DIAGNOSIS — M542 Cervicalgia: Secondary | ICD-10-CM | POA: Diagnosis not present

## 2016-06-01 MED ORDER — GADOBENATE DIMEGLUMINE 529 MG/ML IV SOLN
20.0000 mL | Freq: Once | INTRAVENOUS | Status: AC | PRN
  Administered 2016-06-01: 20 mL via INTRAVENOUS

## 2016-06-04 ENCOUNTER — Other Ambulatory Visit: Payer: Self-pay | Admitting: Student

## 2016-06-04 DIAGNOSIS — M7541 Impingement syndrome of right shoulder: Secondary | ICD-10-CM

## 2016-06-04 DIAGNOSIS — M19011 Primary osteoarthritis, right shoulder: Secondary | ICD-10-CM | POA: Diagnosis not present

## 2016-06-04 DIAGNOSIS — M7581 Other shoulder lesions, right shoulder: Secondary | ICD-10-CM

## 2016-06-06 DIAGNOSIS — R296 Repeated falls: Secondary | ICD-10-CM | POA: Diagnosis not present

## 2016-06-06 DIAGNOSIS — G214 Vascular parkinsonism: Secondary | ICD-10-CM | POA: Diagnosis not present

## 2016-06-06 DIAGNOSIS — G629 Polyneuropathy, unspecified: Secondary | ICD-10-CM | POA: Diagnosis not present

## 2016-06-06 DIAGNOSIS — Z7409 Other reduced mobility: Secondary | ICD-10-CM | POA: Diagnosis not present

## 2016-06-08 DIAGNOSIS — E0842 Diabetes mellitus due to underlying condition with diabetic polyneuropathy: Secondary | ICD-10-CM | POA: Diagnosis not present

## 2016-06-08 DIAGNOSIS — M545 Low back pain: Secondary | ICD-10-CM | POA: Diagnosis not present

## 2016-06-08 DIAGNOSIS — M6281 Muscle weakness (generalized): Secondary | ICD-10-CM | POA: Diagnosis not present

## 2016-06-14 DIAGNOSIS — G629 Polyneuropathy, unspecified: Secondary | ICD-10-CM | POA: Diagnosis not present

## 2016-06-19 ENCOUNTER — Ambulatory Visit
Admission: RE | Admit: 2016-06-19 | Discharge: 2016-06-19 | Disposition: A | Discharge status: Home / Self Care | Payer: Medicare Other | Source: Ambulatory Visit | Attending: Student | Admitting: Student

## 2016-06-19 DIAGNOSIS — M7581 Other shoulder lesions, right shoulder: Secondary | ICD-10-CM | POA: Diagnosis not present

## 2016-06-19 DIAGNOSIS — M7591 Shoulder lesion, unspecified, right shoulder: Secondary | ICD-10-CM | POA: Insufficient documentation

## 2016-06-19 DIAGNOSIS — M19011 Primary osteoarthritis, right shoulder: Secondary | ICD-10-CM | POA: Diagnosis not present

## 2016-06-19 DIAGNOSIS — M7541 Impingement syndrome of right shoulder: Secondary | ICD-10-CM | POA: Diagnosis not present

## 2016-06-21 DIAGNOSIS — E0842 Diabetes mellitus due to underlying condition with diabetic polyneuropathy: Secondary | ICD-10-CM | POA: Diagnosis not present

## 2016-06-21 DIAGNOSIS — M6281 Muscle weakness (generalized): Secondary | ICD-10-CM | POA: Diagnosis not present

## 2016-06-21 DIAGNOSIS — M19011 Primary osteoarthritis, right shoulder: Secondary | ICD-10-CM | POA: Diagnosis not present

## 2016-06-21 DIAGNOSIS — M545 Low back pain: Secondary | ICD-10-CM | POA: Diagnosis not present

## 2016-06-21 DIAGNOSIS — M7581 Other shoulder lesions, right shoulder: Secondary | ICD-10-CM | POA: Diagnosis not present

## 2016-06-22 IMAGING — US US EXTREM LOW VENOUS*R*
1 series · 13 of 24 positions shown · non-contrast
Comparison: None.

CLINICAL DATA: Right leg swelling



[Series 1: us extrem low venous*right* · 0.10mm/px · 13 of 39 slices shown]
[im 1/39]
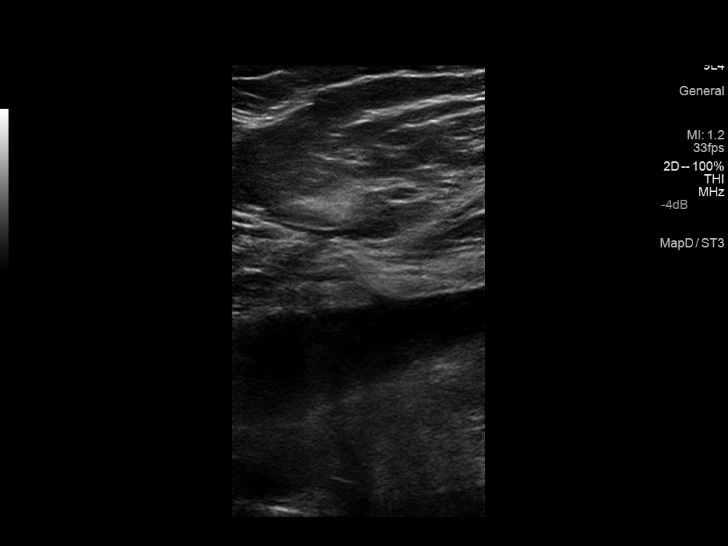
[im 4/39]
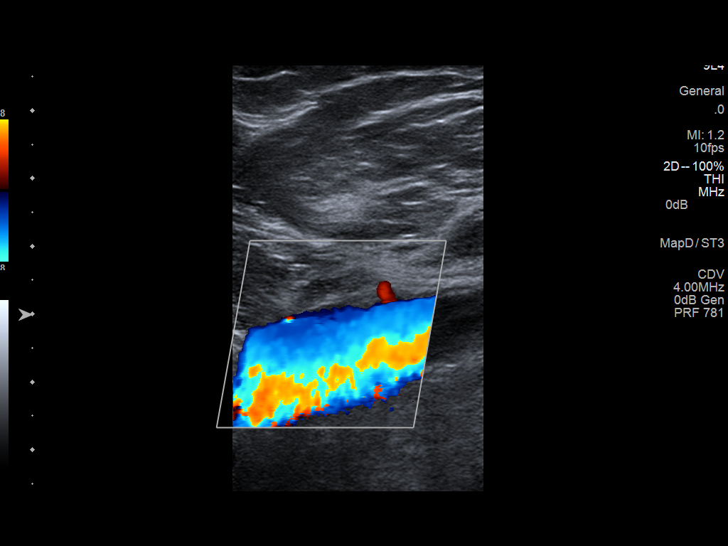
[im 7/39]
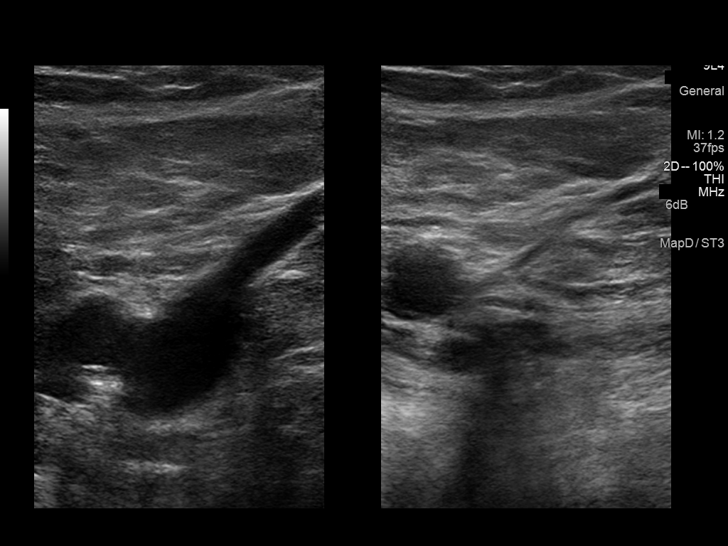
[im 10/39]
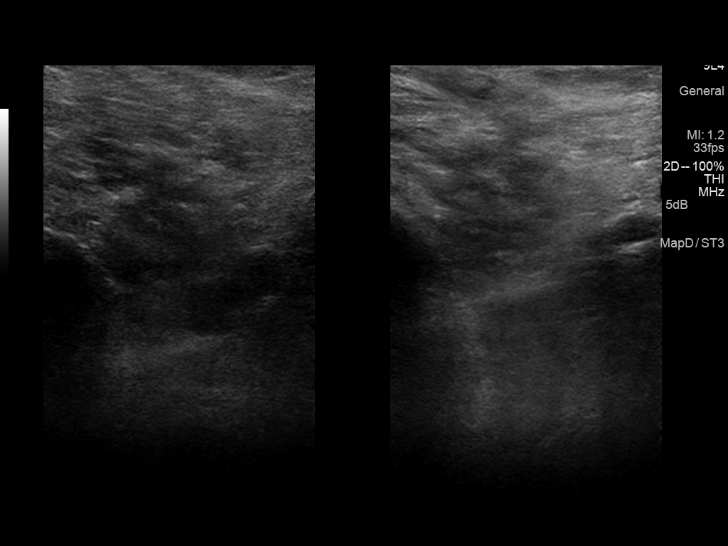
[im 14/39]
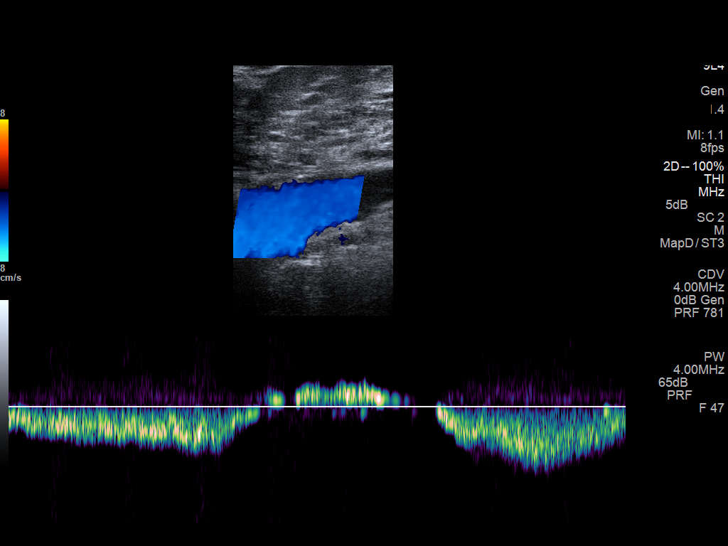
[im 17/39]
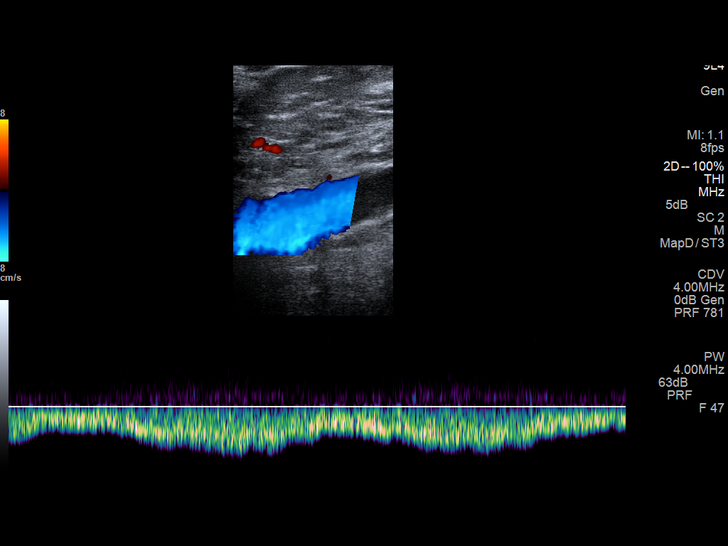
[im 20/39]
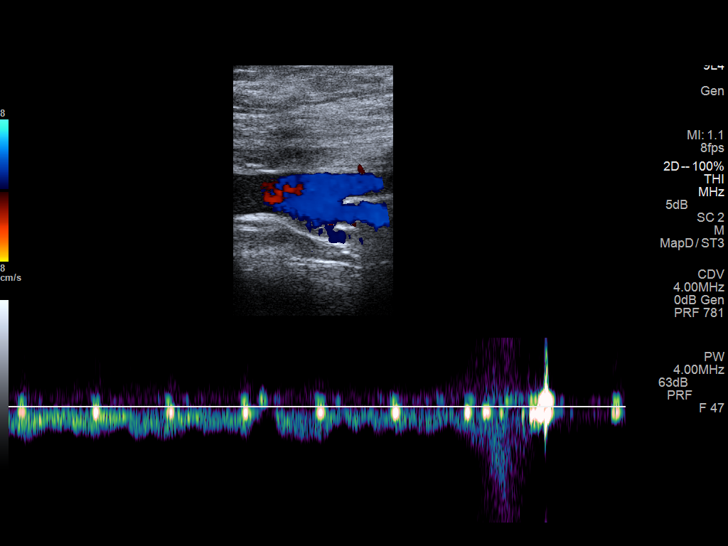
[im 22/39]
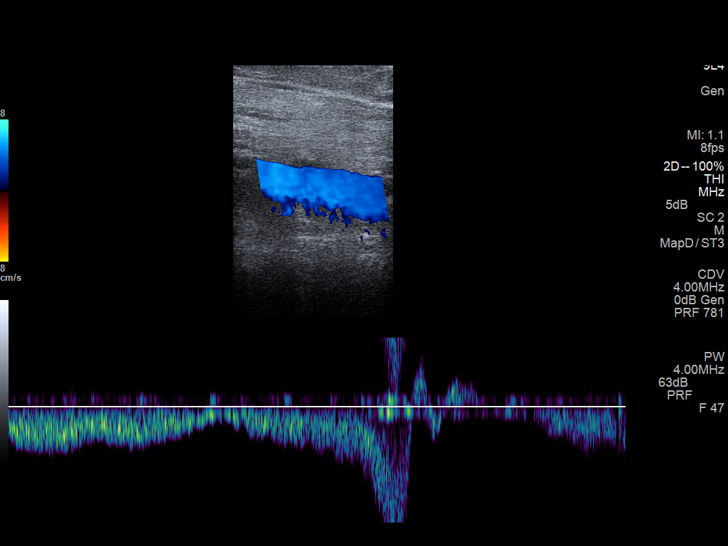
[im 25/39]
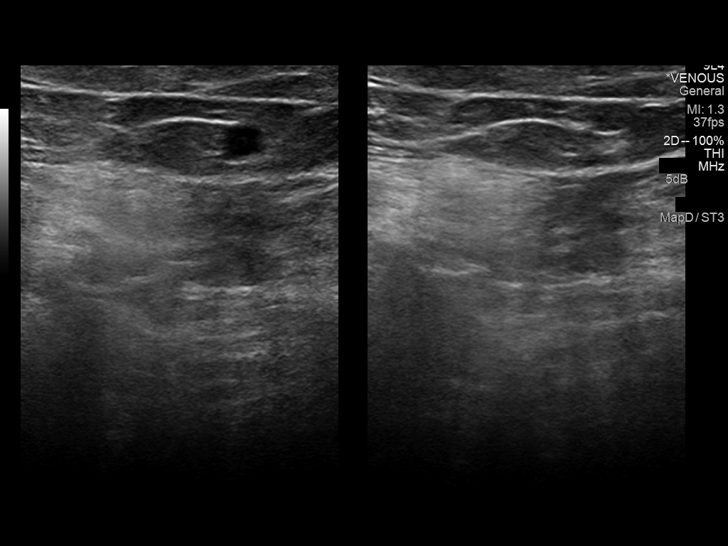
[im 29/39]
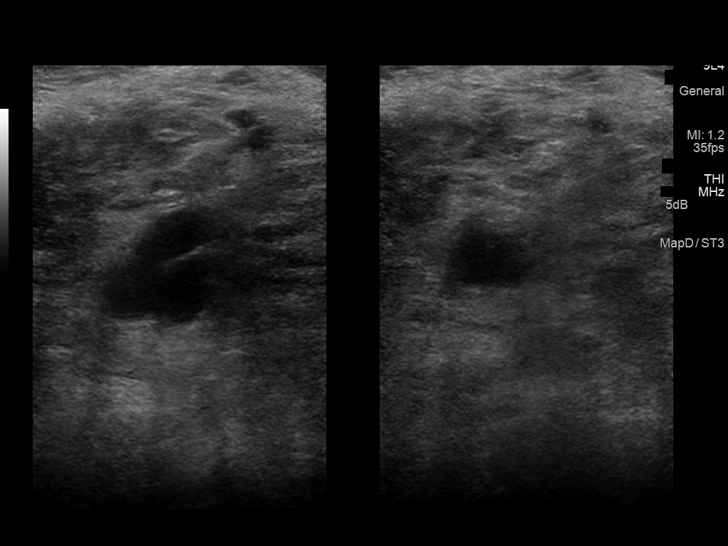
[im 32/39]
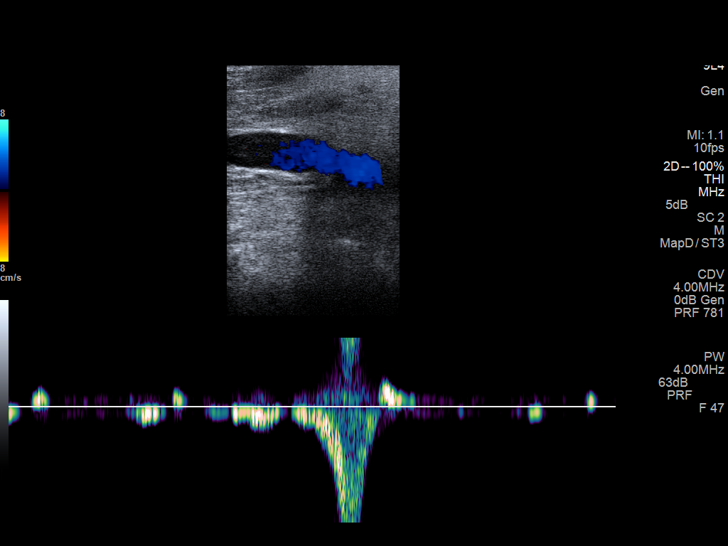
[im 35/39]
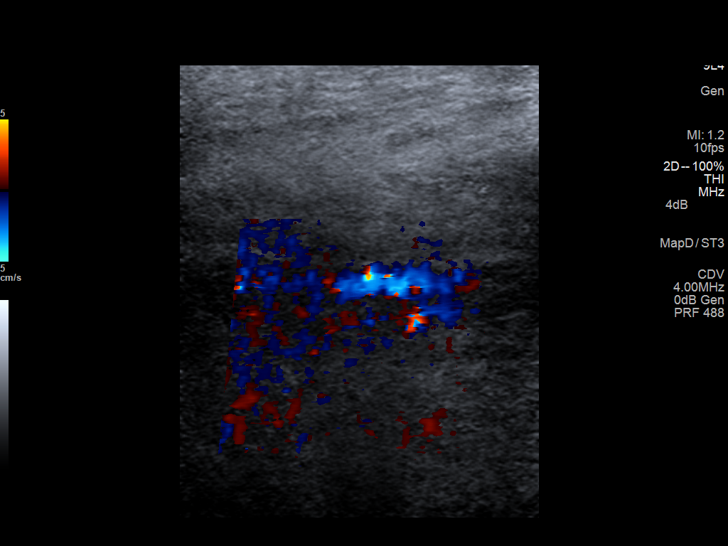
[im 39/39]
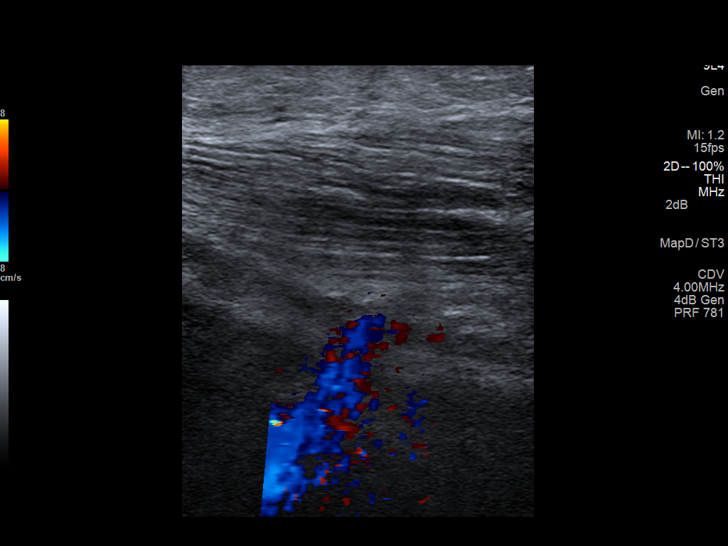

[13 of 24 positions shown; findings below may reference images not displayed]

FINDINGS: Contralateral Common Femoral Vein: Respiratory phasicity is normal
and symmetric with the symptomatic side. No evidence of thrombus.
Normal compressibility.

Common Femoral Vein: No evidence of thrombus. Normal
compressibility, respiratory phasicity and response to augmentation.

Saphenofemoral Junction: No evidence of thrombus. Normal
compressibility and flow on color Doppler imaging.

Profunda Femoral Vein: No evidence of thrombus. Normal
compressibility and flow on color Doppler imaging.

Femoral Vein: No evidence of thrombus. Normal compressibility,
respiratory phasicity and response to augmentation.

Popliteal Vein: No evidence of thrombus. Normal compressibility,
respiratory phasicity and response to augmentation.

Calf Veins: No evidence of thrombus. Normal compressibility and flow
on color Doppler imaging.

Superficial Great Saphenous Vein: No evidence of thrombus. Normal
compressibility and flow on color Doppler imaging.

Venous Reflux:  None.

Other Findings:  None.
IMPRESSION: No evidence of deep venous thrombosis.

## 2016-06-23 IMAGING — CR DG LUMBAR SPINE 2-3V
1 series · 3 of 3 positions shown · non-contrast
Comparison: Lumbar spine MRI performed 10/27/2008, and CT abdomen
and pelvis performed 01/04/2014

CLINICAL DATA: Acute onset of lower back pain for 10 days. Initial
encounter.

EXAM:
LUMBAR SPINE - 2-3 VIEW

[Series 1: dxr lumbar spine ap and lateral · 0.14mm/px · 3 of 3 slices shown]
[im 1/3]
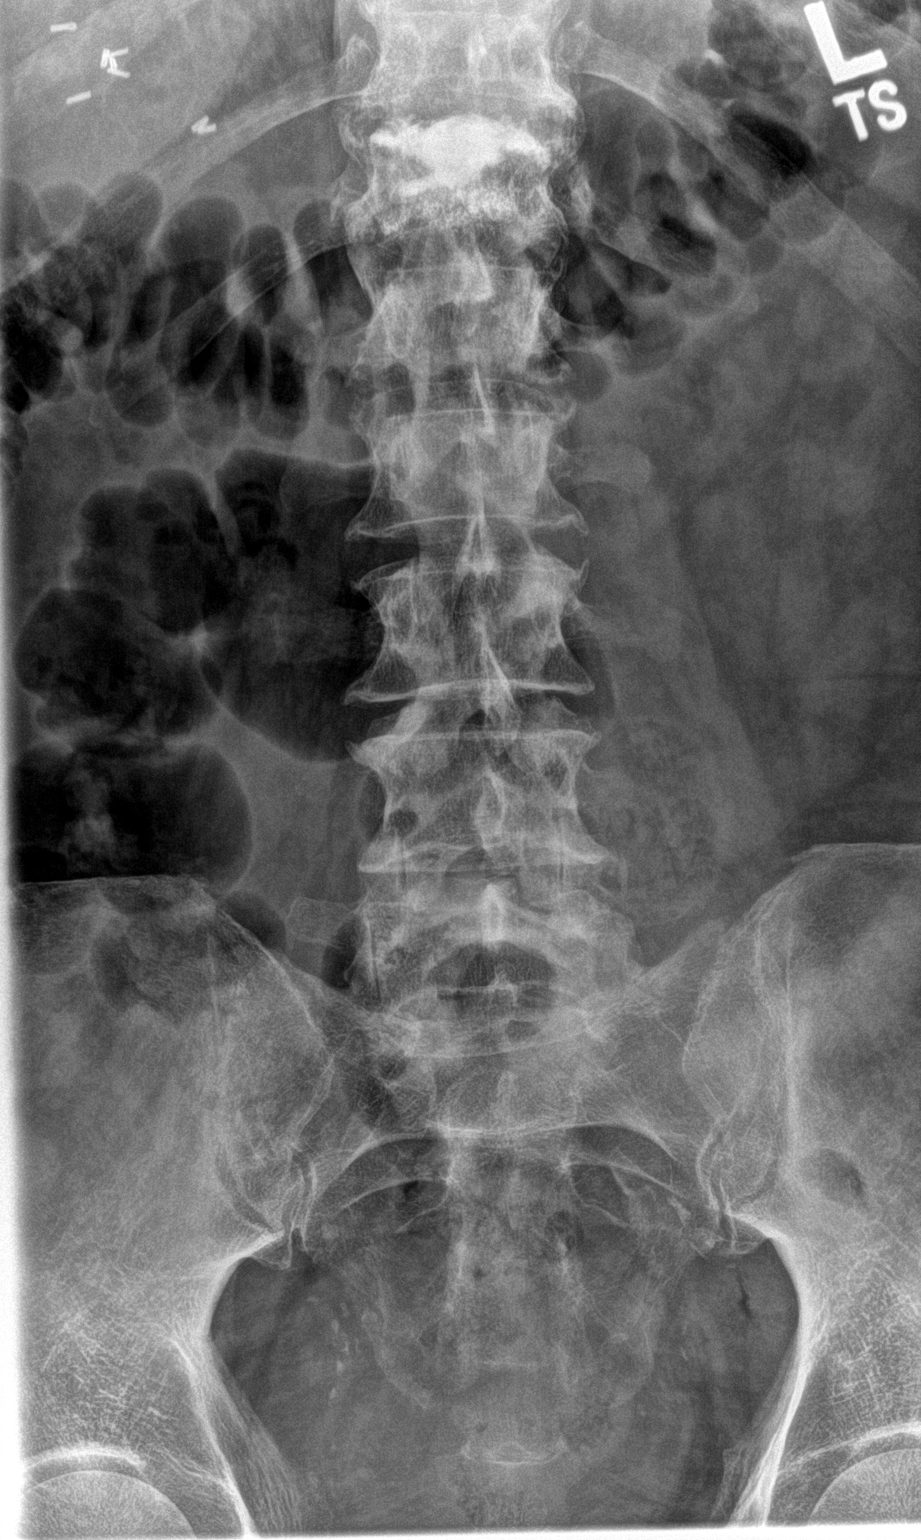
[im 2/3]
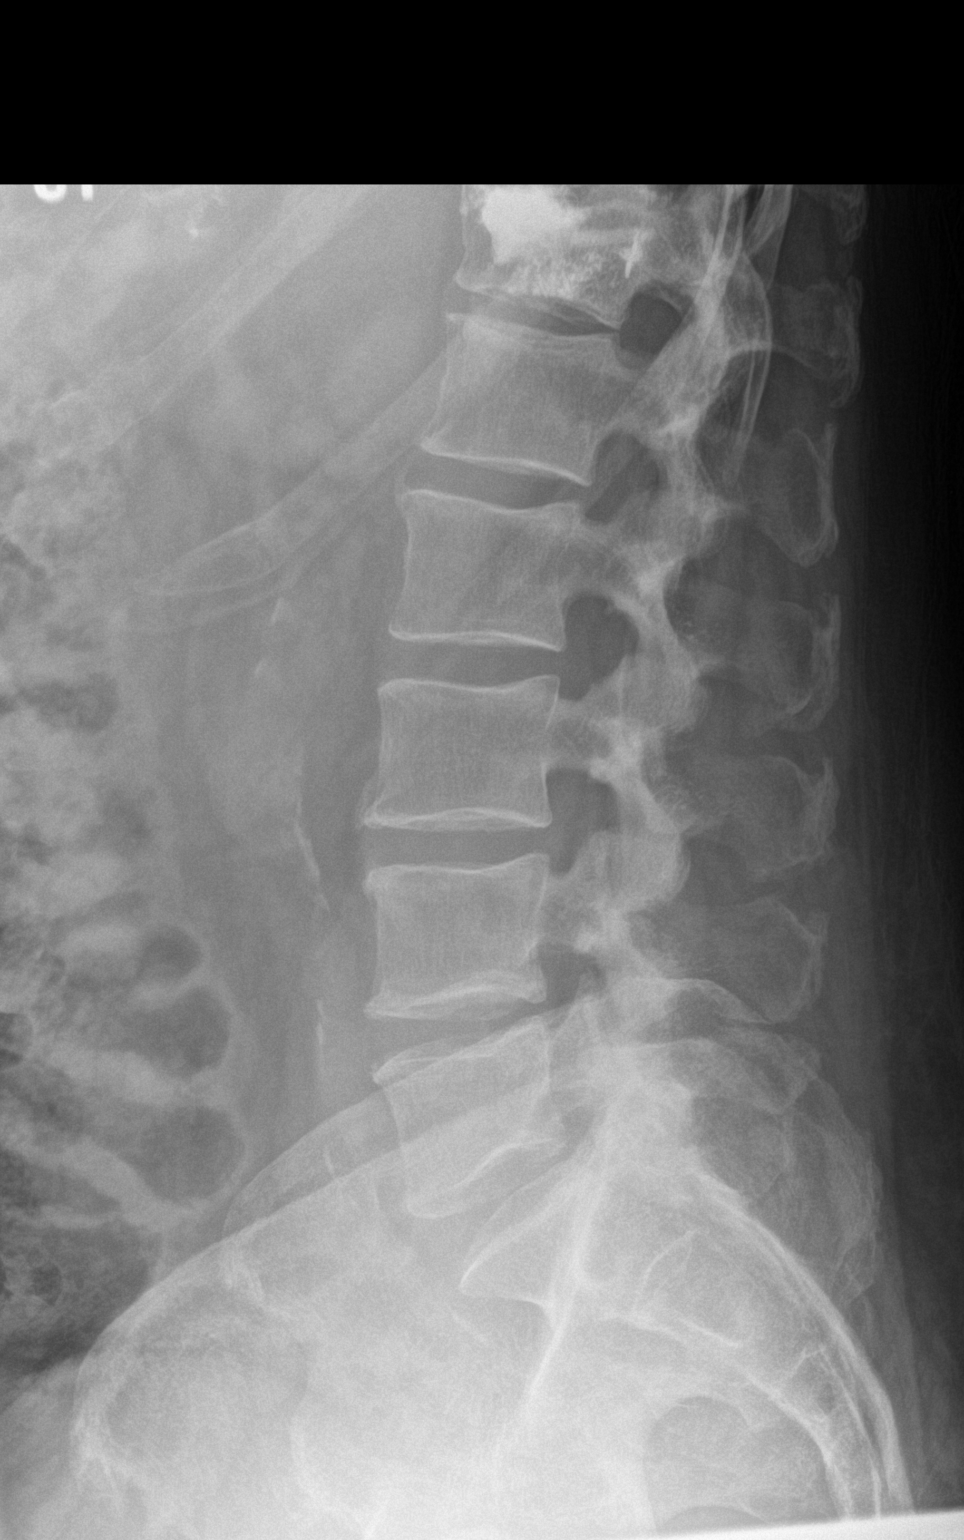
[im 3/3]
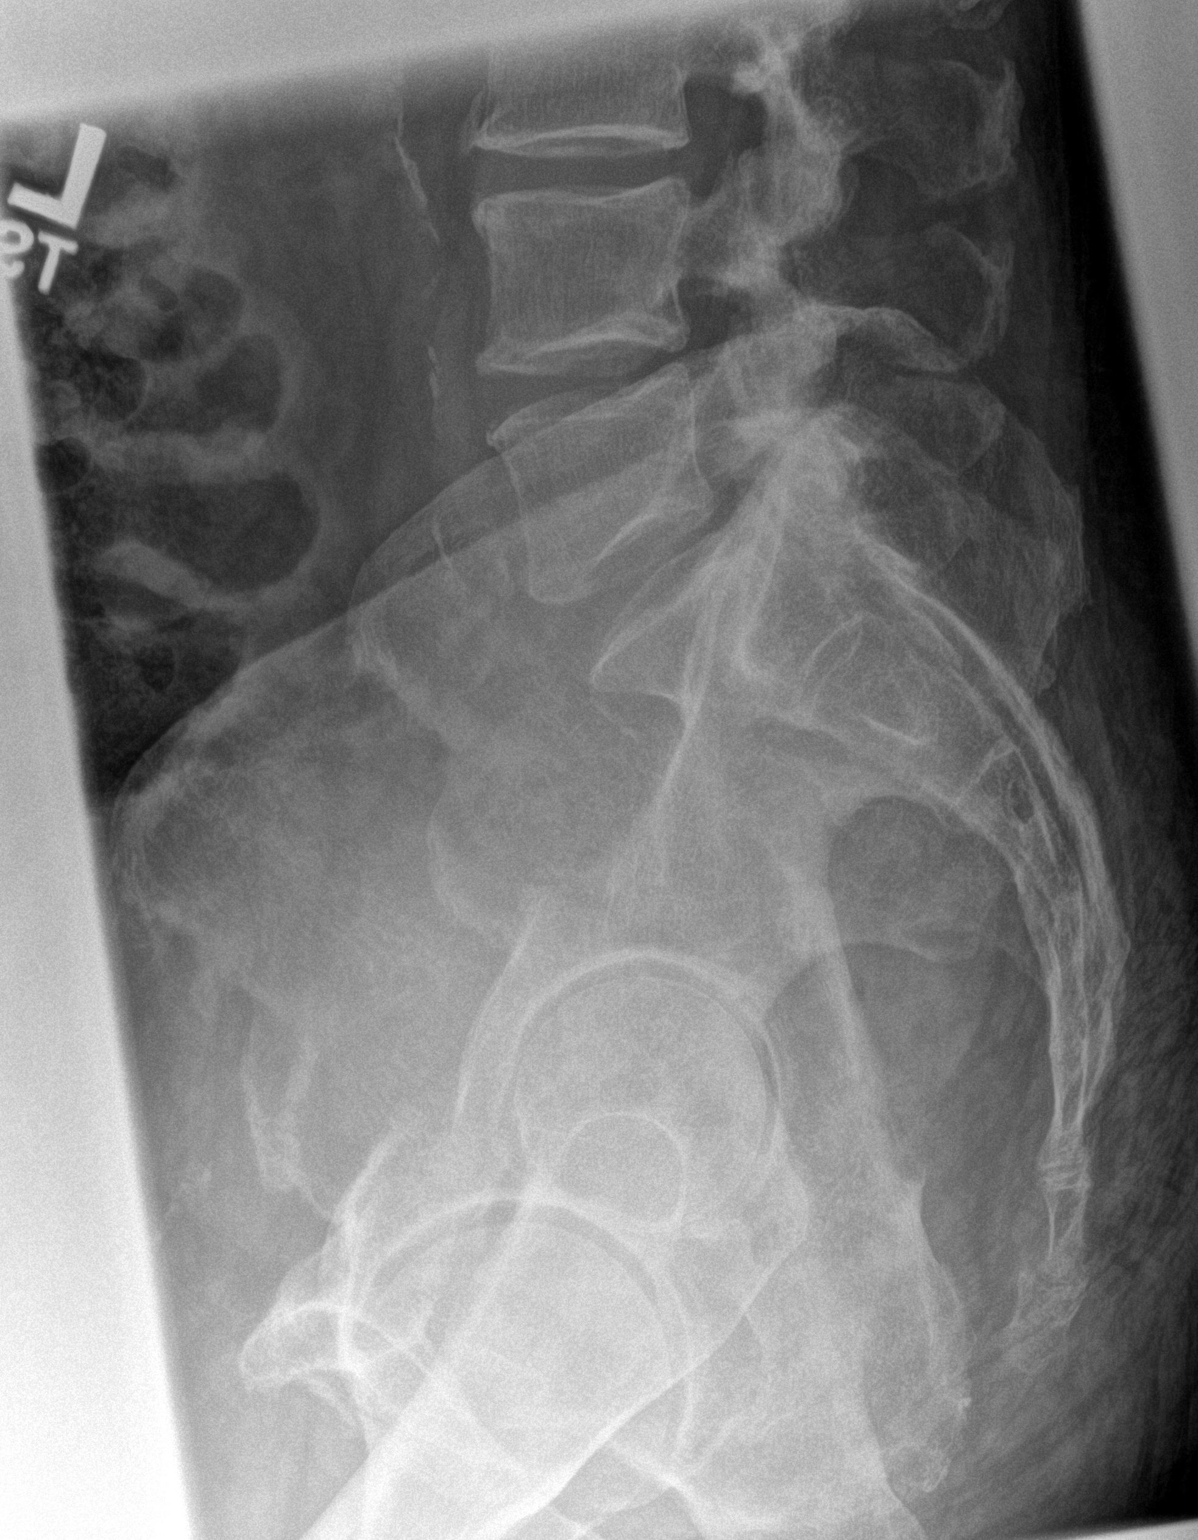

[3 of 3 positions shown; findings below may reference images not displayed]

FINDINGS: There is no evidence of acute fracture or subluxation. The patient
is status post vertebroplasty at T12, with mild underlying chronic
loss of height. Vertebral bodies demonstrate normal alignment.
Intervertebral disc spaces are preserved. Mild facet disease is
noted at the lower lumbar spine.

The visualized bowel gas pattern is unremarkable in appearance; air
and stool are noted within the colon. The sacroiliac joints are
within normal limits. Clips are noted within the right upper
quadrant, reflecting prior cholecystectomy. Mild vascular
calcifications are noted.
IMPRESSION: No evidence of acute fracture or subluxation along the lumbar spine.
Status post vertebroplasty at T12.

## 2016-06-23 IMAGING — MR MRI LUMBAR SPINE WITHOUT CONTRAST
4 of 5 series · 15 of 48 positions shown · non-contrast
Comparison: Radiographs 11/10/2014, abdominal pelvic CT 01/04/2014
and lumbar MRI 10/27/2008.

CLINICAL DATA: Low back and right leg pain for 10 days. No known
injury. History of back surgery 2 years ago. Initial encounter.

EXAM:
MRI LUMBAR SPINE WITHOUT CONTRAST
TECHNIQUE: Multiplanar, multisequence MR imaging of the lumbar spine was
performed. No intravenous contrast was administered.

[Series 2: T2 · sagittal · 4.0mm · 0.44mm/px · 6 of 15 slices shown (1 of 2)]
[im 1/15]
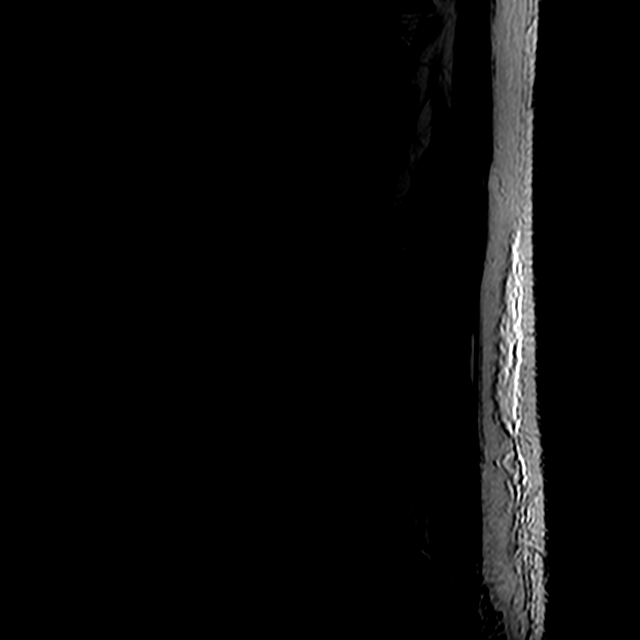
[im 3/15]
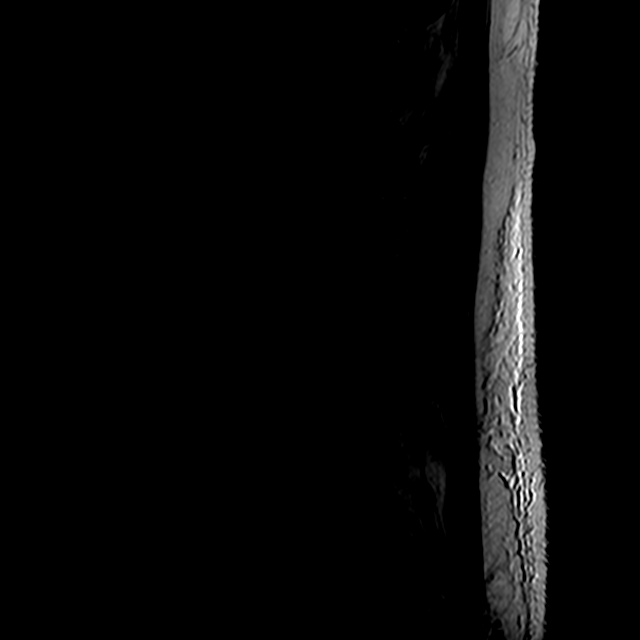
[im 6/15]
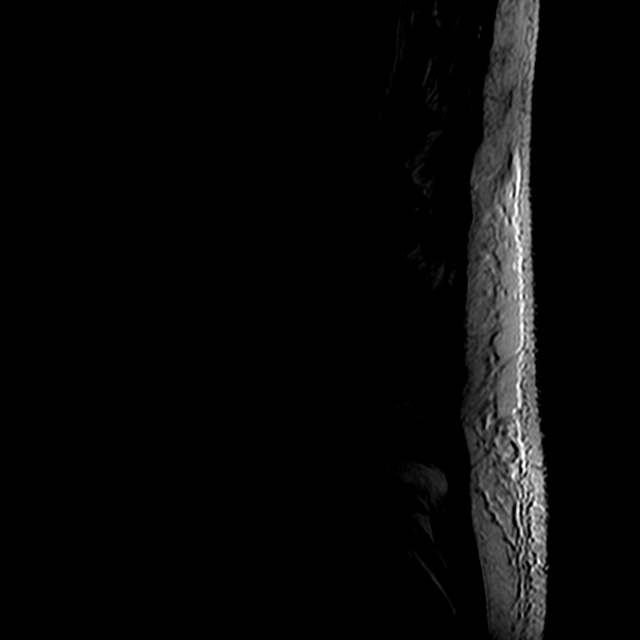
[im 9/15]
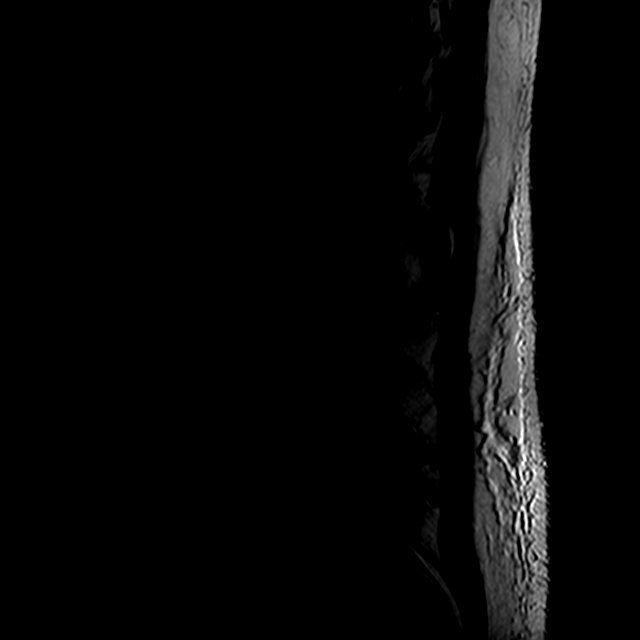
[im 12/15]
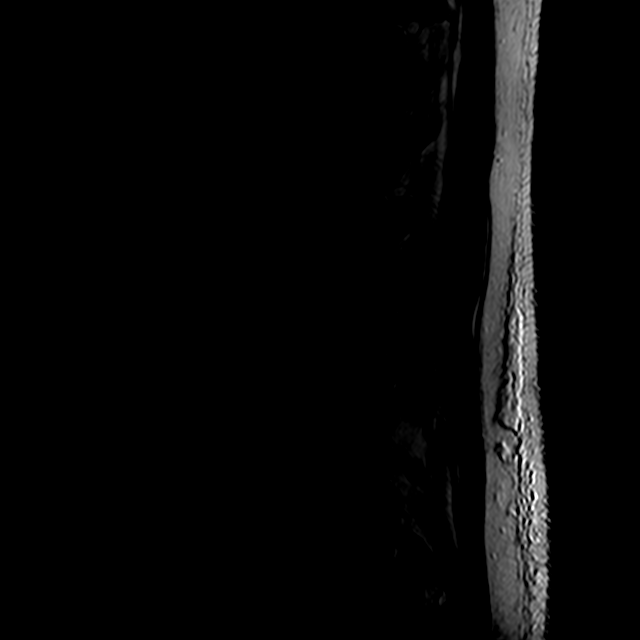
[im 15/15]
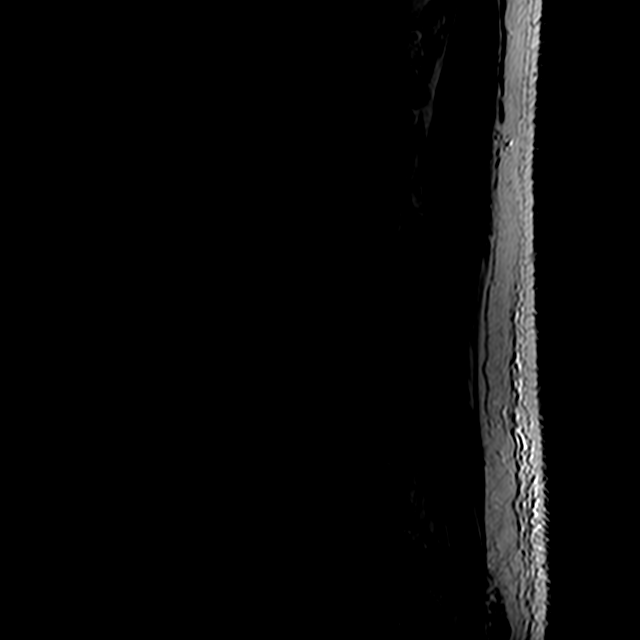

[Series 3: T1 · sagittal · 4.0mm · 0.44mm/px · 3 of 15 slices shown (1 of 2)]
[im 1/15]
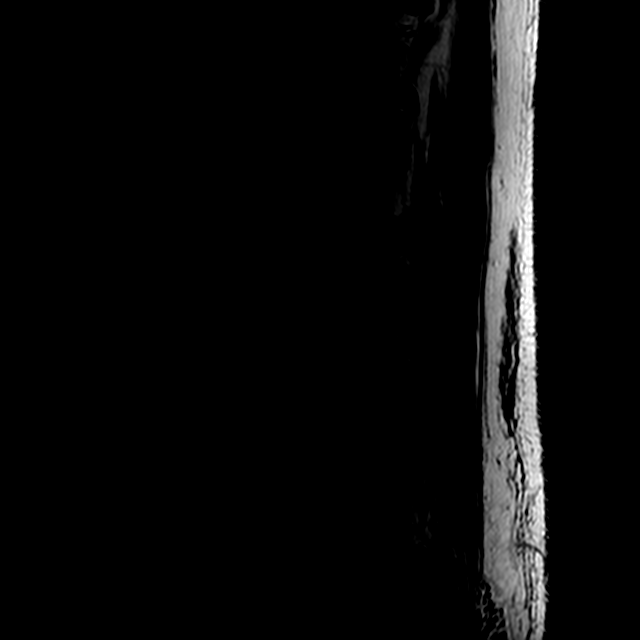
[im 8/15]
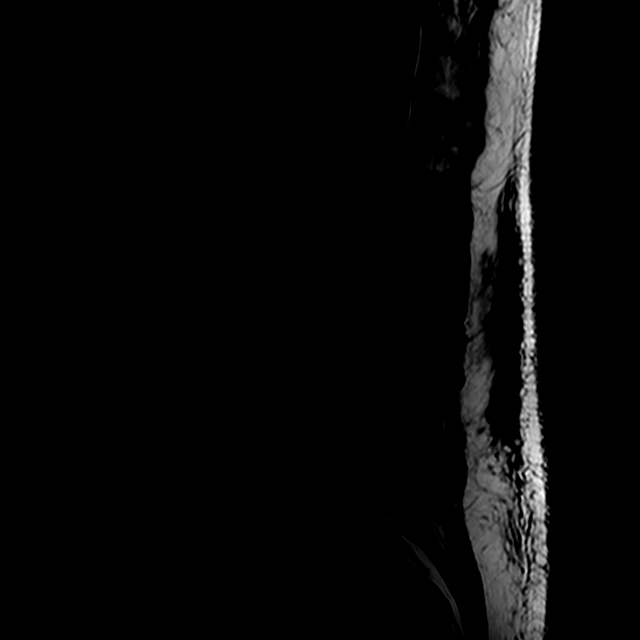
[im 15/15]
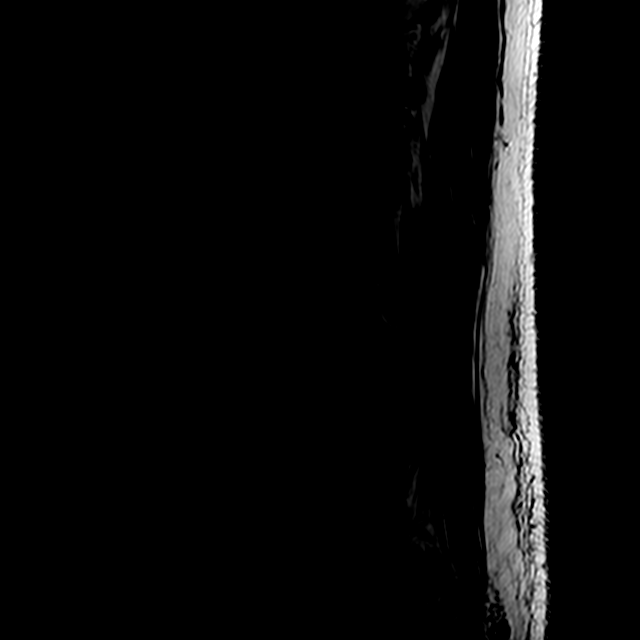

[Series 5: T2 · axial · 4.0mm · 0.39mm/px · z∈[-124,+63]mm · 3 of 47 slices shown (2 of 2)]
[im 7/47]
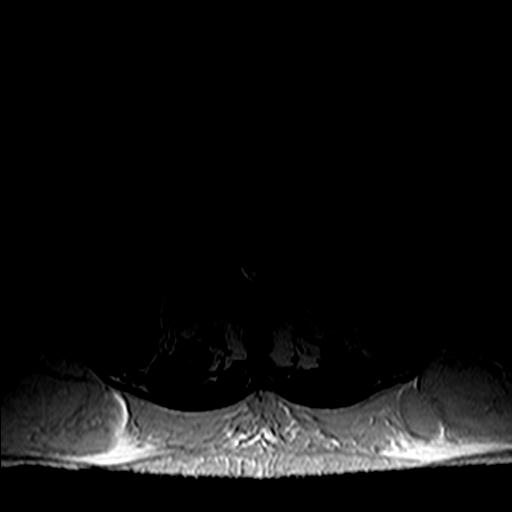
[im 25/47]
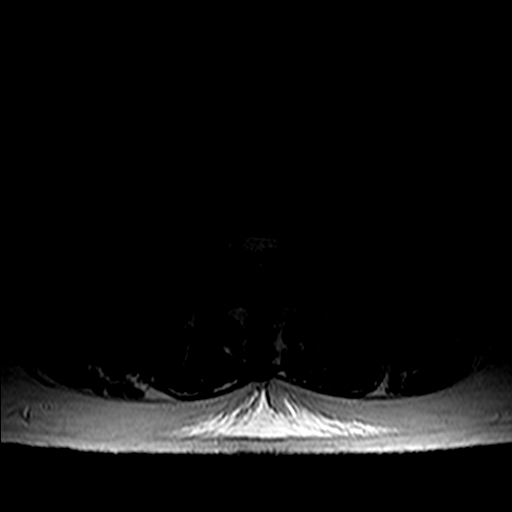
[im 40/47]
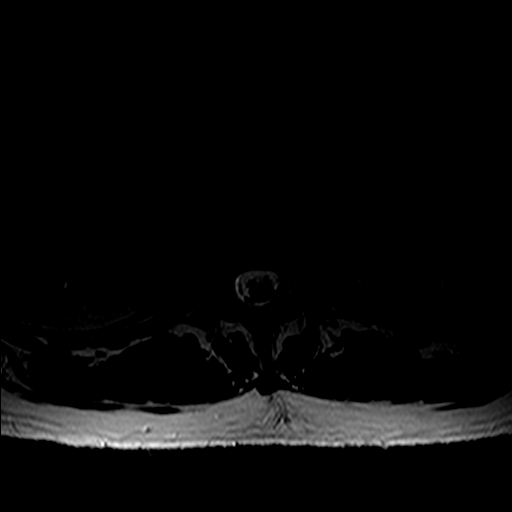

[Series 6: T1 · axial · 4.0mm · 0.39mm/px · z∈[-124,+63]mm · 3 of 47 slices shown (2 of 2)]
[im 7/47]
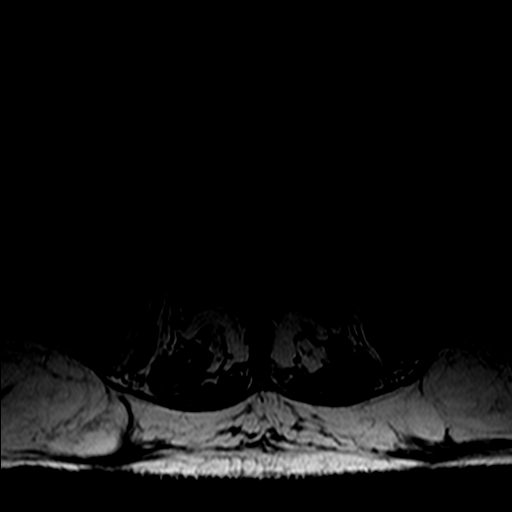
[im 25/47]
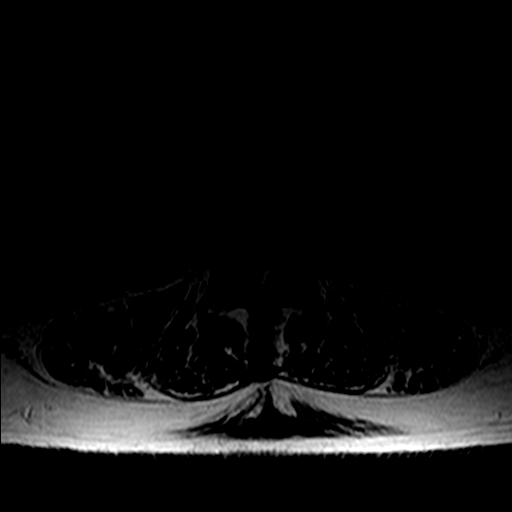
[im 40/47]
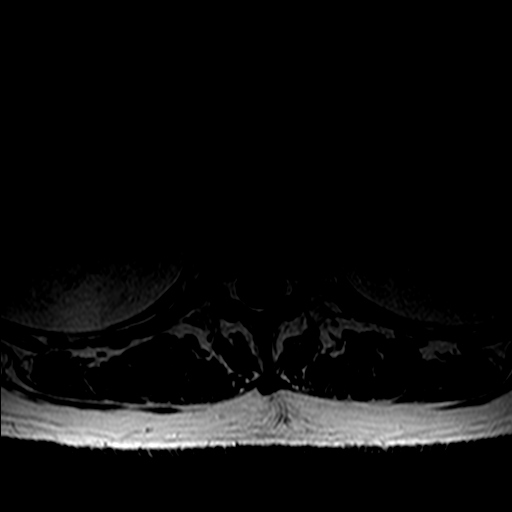

[15 of 48 positions shown; findings below may reference images not displayed]

FINDINGS: There are 5 lumbar type vertebral bodies. Spinal augmentation and
chronic compression fracture at T12 are stable. The alignment is
normal. There is no evidence of fracture or pars defect.

The conus medullaris extends to the L1-2 level and appears normal.
No paraspinal abnormalities are identified.

There is mild disc degeneration and loss of height at T11-12.

There are no significant disc space findings from T12-L1 through
L2-3.

L3-4: Mild disc bulging. No spinal stenosis or nerve root
encroachment.

L4-5: Mild disc bulging, facet and ligamentous hypertrophy. No
spinal stenosis or nerve root encroachment.

L5-S1: Disc height and hydration are maintained. There is mild
bilateral facet hypertrophy. No spinal stenosis or nerve root
encroachment.
IMPRESSION: 1. No acute findings or explanation for the patient's symptoms.
There is no evidence of right-sided nerve root encroachment.
2. Stable T12 fracture status post spinal augmentation. No acute
osseous findings.
3. Mild spondylosis for age.

## 2016-06-24 ENCOUNTER — Other Ambulatory Visit: Payer: Self-pay | Admitting: Internal Medicine

## 2016-06-25 DIAGNOSIS — M545 Low back pain: Secondary | ICD-10-CM | POA: Diagnosis not present

## 2016-06-25 DIAGNOSIS — E0842 Diabetes mellitus due to underlying condition with diabetic polyneuropathy: Secondary | ICD-10-CM | POA: Diagnosis not present

## 2016-06-25 DIAGNOSIS — M6281 Muscle weakness (generalized): Secondary | ICD-10-CM | POA: Diagnosis not present

## 2016-07-03 DIAGNOSIS — M545 Low back pain: Secondary | ICD-10-CM | POA: Diagnosis not present

## 2016-07-03 DIAGNOSIS — E0842 Diabetes mellitus due to underlying condition with diabetic polyneuropathy: Secondary | ICD-10-CM | POA: Diagnosis not present

## 2016-07-03 DIAGNOSIS — M6281 Muscle weakness (generalized): Secondary | ICD-10-CM | POA: Diagnosis not present

## 2016-07-05 DIAGNOSIS — E0842 Diabetes mellitus due to underlying condition with diabetic polyneuropathy: Secondary | ICD-10-CM | POA: Diagnosis not present

## 2016-07-05 DIAGNOSIS — M545 Low back pain: Secondary | ICD-10-CM | POA: Diagnosis not present

## 2016-07-05 DIAGNOSIS — M6281 Muscle weakness (generalized): Secondary | ICD-10-CM | POA: Diagnosis not present

## 2016-07-06 DIAGNOSIS — E1142 Type 2 diabetes mellitus with diabetic polyneuropathy: Secondary | ICD-10-CM | POA: Diagnosis not present

## 2016-07-06 DIAGNOSIS — R29898 Other symptoms and signs involving the musculoskeletal system: Secondary | ICD-10-CM | POA: Diagnosis not present

## 2016-07-10 DIAGNOSIS — M6281 Muscle weakness (generalized): Secondary | ICD-10-CM | POA: Diagnosis not present

## 2016-07-10 DIAGNOSIS — M545 Low back pain: Secondary | ICD-10-CM | POA: Diagnosis not present

## 2016-07-10 DIAGNOSIS — E0842 Diabetes mellitus due to underlying condition with diabetic polyneuropathy: Secondary | ICD-10-CM | POA: Diagnosis not present

## 2016-07-12 DIAGNOSIS — E0842 Diabetes mellitus due to underlying condition with diabetic polyneuropathy: Secondary | ICD-10-CM | POA: Diagnosis not present

## 2016-07-12 DIAGNOSIS — M545 Low back pain: Secondary | ICD-10-CM | POA: Diagnosis not present

## 2016-07-12 DIAGNOSIS — M6281 Muscle weakness (generalized): Secondary | ICD-10-CM | POA: Diagnosis not present

## 2016-07-17 DIAGNOSIS — G629 Polyneuropathy, unspecified: Secondary | ICD-10-CM | POA: Diagnosis not present

## 2016-07-17 DIAGNOSIS — G6289 Other specified polyneuropathies: Secondary | ICD-10-CM | POA: Diagnosis not present

## 2016-07-24 DIAGNOSIS — R809 Proteinuria, unspecified: Secondary | ICD-10-CM | POA: Diagnosis not present

## 2016-07-25 DIAGNOSIS — M6281 Muscle weakness (generalized): Secondary | ICD-10-CM | POA: Diagnosis not present

## 2016-07-25 DIAGNOSIS — E0842 Diabetes mellitus due to underlying condition with diabetic polyneuropathy: Secondary | ICD-10-CM | POA: Diagnosis not present

## 2016-07-25 DIAGNOSIS — M545 Low back pain: Secondary | ICD-10-CM | POA: Diagnosis not present

## 2016-07-27 ENCOUNTER — Telehealth: Payer: Self-pay | Admitting: Cardiovascular Disease

## 2016-07-27 DIAGNOSIS — M6281 Muscle weakness (generalized): Secondary | ICD-10-CM | POA: Diagnosis not present

## 2016-07-27 DIAGNOSIS — E0842 Diabetes mellitus due to underlying condition with diabetic polyneuropathy: Secondary | ICD-10-CM | POA: Diagnosis not present

## 2016-07-27 DIAGNOSIS — M545 Low back pain: Secondary | ICD-10-CM | POA: Diagnosis not present

## 2016-07-27 NOTE — Telephone Encounter (Signed)
Attempted to contact pt wife. No answer, no VM. Will call again.

## 2016-07-27 NOTE — Telephone Encounter (Signed)
Pt spouse would like to know if it would be a good time to send pt to cardiac rehab He is going to physical therapy  Feel like he may need more to help build strength up Please advise

## 2016-07-30 NOTE — Telephone Encounter (Addendum)
S/w pt wife, Horris Latino, who inquires if pt should attempt cardiac rehab again. He has been having PT twice weekly at [facility] in [facility] "for a few months". States the nerves in his legs are "shot" and they are in the process of getting pt a motorized wheelchair. Lower extremity movement is limited.  Ilda Mori to discuss progress w/[facility] and continue there as scheduled. Pt completed 25 days of Cardiac Rehab in 2016 and has not since had a cardiac event. She verbalized understanding w/no further questions at this time.

## 2016-07-31 DIAGNOSIS — E0842 Diabetes mellitus due to underlying condition with diabetic polyneuropathy: Secondary | ICD-10-CM | POA: Diagnosis not present

## 2016-07-31 DIAGNOSIS — M6281 Muscle weakness (generalized): Secondary | ICD-10-CM | POA: Diagnosis not present

## 2016-07-31 DIAGNOSIS — M545 Low back pain: Secondary | ICD-10-CM | POA: Diagnosis not present

## 2016-08-02 DIAGNOSIS — M545 Low back pain: Secondary | ICD-10-CM | POA: Diagnosis not present

## 2016-08-02 DIAGNOSIS — M6281 Muscle weakness (generalized): Secondary | ICD-10-CM | POA: Diagnosis not present

## 2016-08-02 DIAGNOSIS — E0842 Diabetes mellitus due to underlying condition with diabetic polyneuropathy: Secondary | ICD-10-CM | POA: Diagnosis not present

## 2016-08-07 DIAGNOSIS — E0842 Diabetes mellitus due to underlying condition with diabetic polyneuropathy: Secondary | ICD-10-CM | POA: Diagnosis not present

## 2016-08-07 DIAGNOSIS — M6281 Muscle weakness (generalized): Secondary | ICD-10-CM | POA: Diagnosis not present

## 2016-08-07 DIAGNOSIS — M545 Low back pain: Secondary | ICD-10-CM | POA: Diagnosis not present

## 2016-08-09 DIAGNOSIS — M545 Low back pain: Secondary | ICD-10-CM | POA: Diagnosis not present

## 2016-08-09 DIAGNOSIS — M6281 Muscle weakness (generalized): Secondary | ICD-10-CM | POA: Diagnosis not present

## 2016-08-09 DIAGNOSIS — E0842 Diabetes mellitus due to underlying condition with diabetic polyneuropathy: Secondary | ICD-10-CM | POA: Diagnosis not present

## 2016-08-15 DIAGNOSIS — F419 Anxiety disorder, unspecified: Secondary | ICD-10-CM | POA: Diagnosis not present

## 2016-08-15 DIAGNOSIS — Z23 Encounter for immunization: Secondary | ICD-10-CM | POA: Diagnosis not present

## 2016-08-15 DIAGNOSIS — Z87891 Personal history of nicotine dependence: Secondary | ICD-10-CM | POA: Diagnosis not present

## 2016-08-15 DIAGNOSIS — R32 Unspecified urinary incontinence: Secondary | ICD-10-CM | POA: Diagnosis not present

## 2016-08-15 DIAGNOSIS — K219 Gastro-esophageal reflux disease without esophagitis: Secondary | ICD-10-CM | POA: Diagnosis not present

## 2016-08-15 DIAGNOSIS — E1142 Type 2 diabetes mellitus with diabetic polyneuropathy: Secondary | ICD-10-CM | POA: Diagnosis not present

## 2016-08-15 DIAGNOSIS — I2510 Atherosclerotic heart disease of native coronary artery without angina pectoris: Secondary | ICD-10-CM | POA: Diagnosis not present

## 2016-08-15 DIAGNOSIS — Z7984 Long term (current) use of oral hypoglycemic drugs: Secondary | ICD-10-CM | POA: Diagnosis not present

## 2016-08-15 DIAGNOSIS — Z794 Long term (current) use of insulin: Secondary | ICD-10-CM | POA: Diagnosis not present

## 2016-08-15 DIAGNOSIS — G629 Polyneuropathy, unspecified: Secondary | ICD-10-CM | POA: Diagnosis not present

## 2016-08-15 DIAGNOSIS — E1149 Type 2 diabetes mellitus with other diabetic neurological complication: Secondary | ICD-10-CM | POA: Diagnosis not present

## 2016-08-15 DIAGNOSIS — G4733 Obstructive sleep apnea (adult) (pediatric): Secondary | ICD-10-CM | POA: Diagnosis not present

## 2016-08-15 DIAGNOSIS — E785 Hyperlipidemia, unspecified: Secondary | ICD-10-CM | POA: Diagnosis not present

## 2016-08-15 DIAGNOSIS — Z7982 Long term (current) use of aspirin: Secondary | ICD-10-CM | POA: Diagnosis not present

## 2016-08-15 DIAGNOSIS — R809 Proteinuria, unspecified: Secondary | ICD-10-CM | POA: Diagnosis not present

## 2016-08-15 DIAGNOSIS — I10 Essential (primary) hypertension: Secondary | ICD-10-CM | POA: Diagnosis not present

## 2016-08-15 DIAGNOSIS — Z8673 Personal history of transient ischemic attack (TIA), and cerebral infarction without residual deficits: Secondary | ICD-10-CM | POA: Diagnosis not present

## 2016-08-15 DIAGNOSIS — F3341 Major depressive disorder, recurrent, in partial remission: Secondary | ICD-10-CM | POA: Diagnosis not present

## 2016-08-15 DIAGNOSIS — Z955 Presence of coronary angioplasty implant and graft: Secondary | ICD-10-CM | POA: Diagnosis not present

## 2016-08-16 DIAGNOSIS — M6281 Muscle weakness (generalized): Secondary | ICD-10-CM | POA: Diagnosis not present

## 2016-08-16 DIAGNOSIS — M545 Low back pain: Secondary | ICD-10-CM | POA: Diagnosis not present

## 2016-08-16 DIAGNOSIS — E0842 Diabetes mellitus due to underlying condition with diabetic polyneuropathy: Secondary | ICD-10-CM | POA: Diagnosis not present

## 2016-08-22 DIAGNOSIS — R29898 Other symptoms and signs involving the musculoskeletal system: Secondary | ICD-10-CM | POA: Diagnosis not present

## 2016-08-22 DIAGNOSIS — G629 Polyneuropathy, unspecified: Secondary | ICD-10-CM | POA: Diagnosis not present

## 2016-08-22 DIAGNOSIS — G8929 Other chronic pain: Secondary | ICD-10-CM | POA: Diagnosis not present

## 2016-08-22 DIAGNOSIS — M5441 Lumbago with sciatica, right side: Secondary | ICD-10-CM | POA: Diagnosis not present

## 2016-08-22 DIAGNOSIS — I10 Essential (primary) hypertension: Secondary | ICD-10-CM | POA: Diagnosis not present

## 2016-08-28 DIAGNOSIS — Z7982 Long term (current) use of aspirin: Secondary | ICD-10-CM | POA: Diagnosis not present

## 2016-08-28 DIAGNOSIS — Z955 Presence of coronary angioplasty implant and graft: Secondary | ICD-10-CM | POA: Diagnosis not present

## 2016-08-28 DIAGNOSIS — Z87442 Personal history of urinary calculi: Secondary | ICD-10-CM | POA: Diagnosis not present

## 2016-08-28 DIAGNOSIS — R51 Headache: Secondary | ICD-10-CM | POA: Diagnosis not present

## 2016-08-28 DIAGNOSIS — M542 Cervicalgia: Secondary | ICD-10-CM | POA: Diagnosis not present

## 2016-08-28 DIAGNOSIS — M79601 Pain in right arm: Secondary | ICD-10-CM | POA: Diagnosis not present

## 2016-08-28 DIAGNOSIS — M25511 Pain in right shoulder: Secondary | ICD-10-CM | POA: Diagnosis not present

## 2016-08-28 DIAGNOSIS — E1140 Type 2 diabetes mellitus with diabetic neuropathy, unspecified: Secondary | ICD-10-CM | POA: Diagnosis not present

## 2016-08-28 DIAGNOSIS — I2510 Atherosclerotic heart disease of native coronary artery without angina pectoris: Secondary | ICD-10-CM | POA: Diagnosis not present

## 2016-08-28 DIAGNOSIS — M545 Low back pain: Secondary | ICD-10-CM | POA: Diagnosis not present

## 2016-08-28 DIAGNOSIS — M4802 Spinal stenosis, cervical region: Secondary | ICD-10-CM | POA: Diagnosis not present

## 2016-08-28 DIAGNOSIS — Z8673 Personal history of transient ischemic attack (TIA), and cerebral infarction without residual deficits: Secondary | ICD-10-CM | POA: Diagnosis not present

## 2016-08-28 DIAGNOSIS — R1031 Right lower quadrant pain: Secondary | ICD-10-CM | POA: Diagnosis not present

## 2016-08-28 DIAGNOSIS — Z981 Arthrodesis status: Secondary | ICD-10-CM | POA: Diagnosis not present

## 2016-08-28 DIAGNOSIS — R29898 Other symptoms and signs involving the musculoskeletal system: Secondary | ICD-10-CM | POA: Diagnosis not present

## 2016-08-28 DIAGNOSIS — Z9889 Other specified postprocedural states: Secondary | ICD-10-CM | POA: Diagnosis not present

## 2016-08-28 DIAGNOSIS — M898X5 Other specified disorders of bone, thigh: Secondary | ICD-10-CM | POA: Diagnosis not present

## 2016-08-28 DIAGNOSIS — M25551 Pain in right hip: Secondary | ICD-10-CM | POA: Diagnosis not present

## 2016-08-28 DIAGNOSIS — Z87891 Personal history of nicotine dependence: Secondary | ICD-10-CM | POA: Diagnosis not present

## 2016-08-28 DIAGNOSIS — M5440 Lumbago with sciatica, unspecified side: Secondary | ICD-10-CM | POA: Diagnosis not present

## 2016-08-28 DIAGNOSIS — G629 Polyneuropathy, unspecified: Secondary | ICD-10-CM | POA: Diagnosis not present

## 2016-08-28 DIAGNOSIS — R410 Disorientation, unspecified: Secondary | ICD-10-CM | POA: Diagnosis not present

## 2016-08-28 DIAGNOSIS — M5134 Other intervertebral disc degeneration, thoracic region: Secondary | ICD-10-CM | POA: Diagnosis not present

## 2016-08-28 DIAGNOSIS — E785 Hyperlipidemia, unspecified: Secondary | ICD-10-CM | POA: Diagnosis not present

## 2016-08-28 DIAGNOSIS — M25552 Pain in left hip: Secondary | ICD-10-CM | POA: Diagnosis not present

## 2016-08-28 DIAGNOSIS — Z7902 Long term (current) use of antithrombotics/antiplatelets: Secondary | ICD-10-CM | POA: Diagnosis not present

## 2016-08-28 DIAGNOSIS — Z794 Long term (current) use of insulin: Secondary | ICD-10-CM | POA: Diagnosis not present

## 2016-08-28 DIAGNOSIS — K219 Gastro-esophageal reflux disease without esophagitis: Secondary | ICD-10-CM | POA: Diagnosis not present

## 2016-08-28 DIAGNOSIS — I10 Essential (primary) hypertension: Secondary | ICD-10-CM | POA: Diagnosis not present

## 2016-09-11 DIAGNOSIS — R32 Unspecified urinary incontinence: Secondary | ICD-10-CM | POA: Diagnosis not present

## 2016-09-11 DIAGNOSIS — Z8673 Personal history of transient ischemic attack (TIA), and cerebral infarction without residual deficits: Secondary | ICD-10-CM | POA: Diagnosis not present

## 2016-09-11 DIAGNOSIS — Z794 Long term (current) use of insulin: Secondary | ICD-10-CM | POA: Diagnosis not present

## 2016-09-11 DIAGNOSIS — I252 Old myocardial infarction: Secondary | ICD-10-CM | POA: Diagnosis not present

## 2016-09-11 DIAGNOSIS — G20 Parkinson's disease: Secondary | ICD-10-CM | POA: Diagnosis not present

## 2016-09-11 DIAGNOSIS — M4802 Spinal stenosis, cervical region: Secondary | ICD-10-CM | POA: Diagnosis not present

## 2016-09-11 DIAGNOSIS — E1140 Type 2 diabetes mellitus with diabetic neuropathy, unspecified: Secondary | ICD-10-CM | POA: Diagnosis not present

## 2016-09-11 DIAGNOSIS — M4846XD Fatigue fracture of vertebra, lumbar region, subsequent encounter for fracture with routine healing: Secondary | ICD-10-CM | POA: Diagnosis not present

## 2016-09-11 DIAGNOSIS — Z9181 History of falling: Secondary | ICD-10-CM | POA: Diagnosis not present

## 2016-09-11 DIAGNOSIS — Z7984 Long term (current) use of oral hypoglycemic drugs: Secondary | ICD-10-CM | POA: Diagnosis not present

## 2016-09-11 DIAGNOSIS — S61401D Unspecified open wound of right hand, subsequent encounter: Secondary | ICD-10-CM | POA: Diagnosis not present

## 2016-09-11 DIAGNOSIS — I2510 Atherosclerotic heart disease of native coronary artery without angina pectoris: Secondary | ICD-10-CM | POA: Diagnosis not present

## 2016-09-11 DIAGNOSIS — F329 Major depressive disorder, single episode, unspecified: Secondary | ICD-10-CM | POA: Diagnosis not present

## 2016-09-11 DIAGNOSIS — Z7902 Long term (current) use of antithrombotics/antiplatelets: Secondary | ICD-10-CM | POA: Diagnosis not present

## 2016-09-11 DIAGNOSIS — Z7982 Long term (current) use of aspirin: Secondary | ICD-10-CM | POA: Diagnosis not present

## 2016-09-11 DIAGNOSIS — Z87891 Personal history of nicotine dependence: Secondary | ICD-10-CM | POA: Diagnosis not present

## 2016-09-11 DIAGNOSIS — I110 Hypertensive heart disease with heart failure: Secondary | ICD-10-CM | POA: Diagnosis not present

## 2016-09-11 DIAGNOSIS — I509 Heart failure, unspecified: Secondary | ICD-10-CM | POA: Diagnosis not present

## 2016-09-12 DIAGNOSIS — M4846XD Fatigue fracture of vertebra, lumbar region, subsequent encounter for fracture with routine healing: Secondary | ICD-10-CM | POA: Diagnosis not present

## 2016-09-12 DIAGNOSIS — E1140 Type 2 diabetes mellitus with diabetic neuropathy, unspecified: Secondary | ICD-10-CM | POA: Diagnosis not present

## 2016-09-12 DIAGNOSIS — I110 Hypertensive heart disease with heart failure: Secondary | ICD-10-CM | POA: Diagnosis not present

## 2016-09-12 DIAGNOSIS — M4802 Spinal stenosis, cervical region: Secondary | ICD-10-CM | POA: Diagnosis not present

## 2016-09-12 DIAGNOSIS — S61401D Unspecified open wound of right hand, subsequent encounter: Secondary | ICD-10-CM | POA: Diagnosis not present

## 2016-09-12 DIAGNOSIS — G20 Parkinson's disease: Secondary | ICD-10-CM | POA: Diagnosis not present

## 2016-09-13 DIAGNOSIS — M4846XD Fatigue fracture of vertebra, lumbar region, subsequent encounter for fracture with routine healing: Secondary | ICD-10-CM | POA: Diagnosis not present

## 2016-09-13 DIAGNOSIS — E1140 Type 2 diabetes mellitus with diabetic neuropathy, unspecified: Secondary | ICD-10-CM | POA: Diagnosis not present

## 2016-09-13 DIAGNOSIS — I110 Hypertensive heart disease with heart failure: Secondary | ICD-10-CM | POA: Diagnosis not present

## 2016-09-13 DIAGNOSIS — G20 Parkinson's disease: Secondary | ICD-10-CM | POA: Diagnosis not present

## 2016-09-13 DIAGNOSIS — M4802 Spinal stenosis, cervical region: Secondary | ICD-10-CM | POA: Diagnosis not present

## 2016-09-13 DIAGNOSIS — S61401D Unspecified open wound of right hand, subsequent encounter: Secondary | ICD-10-CM | POA: Diagnosis not present

## 2016-09-14 DIAGNOSIS — I110 Hypertensive heart disease with heart failure: Secondary | ICD-10-CM | POA: Diagnosis not present

## 2016-09-14 DIAGNOSIS — E1140 Type 2 diabetes mellitus with diabetic neuropathy, unspecified: Secondary | ICD-10-CM | POA: Diagnosis not present

## 2016-09-14 DIAGNOSIS — M4846XD Fatigue fracture of vertebra, lumbar region, subsequent encounter for fracture with routine healing: Secondary | ICD-10-CM | POA: Diagnosis not present

## 2016-09-14 DIAGNOSIS — G20 Parkinson's disease: Secondary | ICD-10-CM | POA: Diagnosis not present

## 2016-09-14 DIAGNOSIS — M4802 Spinal stenosis, cervical region: Secondary | ICD-10-CM | POA: Diagnosis not present

## 2016-09-14 DIAGNOSIS — S61401D Unspecified open wound of right hand, subsequent encounter: Secondary | ICD-10-CM | POA: Diagnosis not present

## 2016-09-17 DIAGNOSIS — I110 Hypertensive heart disease with heart failure: Secondary | ICD-10-CM | POA: Diagnosis not present

## 2016-09-17 DIAGNOSIS — M4802 Spinal stenosis, cervical region: Secondary | ICD-10-CM | POA: Diagnosis not present

## 2016-09-17 DIAGNOSIS — S61401D Unspecified open wound of right hand, subsequent encounter: Secondary | ICD-10-CM | POA: Diagnosis not present

## 2016-09-17 DIAGNOSIS — G20 Parkinson's disease: Secondary | ICD-10-CM | POA: Diagnosis not present

## 2016-09-17 DIAGNOSIS — M4846XD Fatigue fracture of vertebra, lumbar region, subsequent encounter for fracture with routine healing: Secondary | ICD-10-CM | POA: Diagnosis not present

## 2016-09-17 DIAGNOSIS — E1140 Type 2 diabetes mellitus with diabetic neuropathy, unspecified: Secondary | ICD-10-CM | POA: Diagnosis not present

## 2016-09-18 DIAGNOSIS — E119 Type 2 diabetes mellitus without complications: Secondary | ICD-10-CM | POA: Diagnosis not present

## 2016-09-19 DIAGNOSIS — G20 Parkinson's disease: Secondary | ICD-10-CM | POA: Diagnosis not present

## 2016-09-19 DIAGNOSIS — E1140 Type 2 diabetes mellitus with diabetic neuropathy, unspecified: Secondary | ICD-10-CM | POA: Diagnosis not present

## 2016-09-19 DIAGNOSIS — S61401D Unspecified open wound of right hand, subsequent encounter: Secondary | ICD-10-CM | POA: Diagnosis not present

## 2016-09-19 DIAGNOSIS — M4802 Spinal stenosis, cervical region: Secondary | ICD-10-CM | POA: Diagnosis not present

## 2016-09-19 DIAGNOSIS — M4846XD Fatigue fracture of vertebra, lumbar region, subsequent encounter for fracture with routine healing: Secondary | ICD-10-CM | POA: Diagnosis not present

## 2016-09-19 DIAGNOSIS — I110 Hypertensive heart disease with heart failure: Secondary | ICD-10-CM | POA: Diagnosis not present

## 2016-09-20 DIAGNOSIS — E1140 Type 2 diabetes mellitus with diabetic neuropathy, unspecified: Secondary | ICD-10-CM | POA: Diagnosis not present

## 2016-09-20 DIAGNOSIS — M4802 Spinal stenosis, cervical region: Secondary | ICD-10-CM | POA: Diagnosis not present

## 2016-09-20 DIAGNOSIS — S61401D Unspecified open wound of right hand, subsequent encounter: Secondary | ICD-10-CM | POA: Diagnosis not present

## 2016-09-20 DIAGNOSIS — M4846XD Fatigue fracture of vertebra, lumbar region, subsequent encounter for fracture with routine healing: Secondary | ICD-10-CM | POA: Diagnosis not present

## 2016-09-20 DIAGNOSIS — I110 Hypertensive heart disease with heart failure: Secondary | ICD-10-CM | POA: Diagnosis not present

## 2016-09-20 DIAGNOSIS — G20 Parkinson's disease: Secondary | ICD-10-CM | POA: Diagnosis not present

## 2016-09-24 DIAGNOSIS — M7551 Bursitis of right shoulder: Secondary | ICD-10-CM | POA: Diagnosis not present

## 2016-09-24 DIAGNOSIS — M19011 Primary osteoarthritis, right shoulder: Secondary | ICD-10-CM | POA: Diagnosis not present

## 2016-09-26 DIAGNOSIS — I110 Hypertensive heart disease with heart failure: Secondary | ICD-10-CM | POA: Diagnosis not present

## 2016-09-26 DIAGNOSIS — M4802 Spinal stenosis, cervical region: Secondary | ICD-10-CM | POA: Diagnosis not present

## 2016-09-26 DIAGNOSIS — G20 Parkinson's disease: Secondary | ICD-10-CM | POA: Diagnosis not present

## 2016-09-26 DIAGNOSIS — E1140 Type 2 diabetes mellitus with diabetic neuropathy, unspecified: Secondary | ICD-10-CM | POA: Diagnosis not present

## 2016-09-26 DIAGNOSIS — M4846XD Fatigue fracture of vertebra, lumbar region, subsequent encounter for fracture with routine healing: Secondary | ICD-10-CM | POA: Diagnosis not present

## 2016-09-26 DIAGNOSIS — S61401D Unspecified open wound of right hand, subsequent encounter: Secondary | ICD-10-CM | POA: Diagnosis not present

## 2016-09-27 DIAGNOSIS — I110 Hypertensive heart disease with heart failure: Secondary | ICD-10-CM | POA: Diagnosis not present

## 2016-09-27 DIAGNOSIS — S61401D Unspecified open wound of right hand, subsequent encounter: Secondary | ICD-10-CM | POA: Diagnosis not present

## 2016-09-27 DIAGNOSIS — M4802 Spinal stenosis, cervical region: Secondary | ICD-10-CM | POA: Diagnosis not present

## 2016-09-27 DIAGNOSIS — M4846XD Fatigue fracture of vertebra, lumbar region, subsequent encounter for fracture with routine healing: Secondary | ICD-10-CM | POA: Diagnosis not present

## 2016-09-27 DIAGNOSIS — E1140 Type 2 diabetes mellitus with diabetic neuropathy, unspecified: Secondary | ICD-10-CM | POA: Diagnosis not present

## 2016-09-27 DIAGNOSIS — G20 Parkinson's disease: Secondary | ICD-10-CM | POA: Diagnosis not present

## 2016-09-28 DIAGNOSIS — E1140 Type 2 diabetes mellitus with diabetic neuropathy, unspecified: Secondary | ICD-10-CM | POA: Diagnosis not present

## 2016-09-28 DIAGNOSIS — M4802 Spinal stenosis, cervical region: Secondary | ICD-10-CM | POA: Diagnosis not present

## 2016-09-28 DIAGNOSIS — M4846XD Fatigue fracture of vertebra, lumbar region, subsequent encounter for fracture with routine healing: Secondary | ICD-10-CM | POA: Diagnosis not present

## 2016-09-28 DIAGNOSIS — G20 Parkinson's disease: Secondary | ICD-10-CM | POA: Diagnosis not present

## 2016-09-28 DIAGNOSIS — S61401D Unspecified open wound of right hand, subsequent encounter: Secondary | ICD-10-CM | POA: Diagnosis not present

## 2016-09-28 DIAGNOSIS — I110 Hypertensive heart disease with heart failure: Secondary | ICD-10-CM | POA: Diagnosis not present

## 2016-10-01 DIAGNOSIS — M4846XD Fatigue fracture of vertebra, lumbar region, subsequent encounter for fracture with routine healing: Secondary | ICD-10-CM | POA: Diagnosis not present

## 2016-10-01 DIAGNOSIS — M4802 Spinal stenosis, cervical region: Secondary | ICD-10-CM | POA: Diagnosis not present

## 2016-10-01 DIAGNOSIS — E1140 Type 2 diabetes mellitus with diabetic neuropathy, unspecified: Secondary | ICD-10-CM | POA: Diagnosis not present

## 2016-10-01 DIAGNOSIS — G20 Parkinson's disease: Secondary | ICD-10-CM | POA: Diagnosis not present

## 2016-10-01 DIAGNOSIS — S61401D Unspecified open wound of right hand, subsequent encounter: Secondary | ICD-10-CM | POA: Diagnosis not present

## 2016-10-01 DIAGNOSIS — I110 Hypertensive heart disease with heart failure: Secondary | ICD-10-CM | POA: Diagnosis not present

## 2016-10-03 DIAGNOSIS — G3184 Mild cognitive impairment, so stated: Secondary | ICD-10-CM | POA: Diagnosis not present

## 2016-10-03 DIAGNOSIS — R809 Proteinuria, unspecified: Secondary | ICD-10-CM | POA: Diagnosis not present

## 2016-10-03 DIAGNOSIS — Z9989 Dependence on other enabling machines and devices: Secondary | ICD-10-CM | POA: Diagnosis not present

## 2016-10-03 DIAGNOSIS — E1149 Type 2 diabetes mellitus with other diabetic neurological complication: Secondary | ICD-10-CM | POA: Diagnosis not present

## 2016-10-03 DIAGNOSIS — I10 Essential (primary) hypertension: Secondary | ICD-10-CM | POA: Diagnosis not present

## 2016-10-03 DIAGNOSIS — G4733 Obstructive sleep apnea (adult) (pediatric): Secondary | ICD-10-CM | POA: Diagnosis not present

## 2016-10-03 DIAGNOSIS — Z794 Long term (current) use of insulin: Secondary | ICD-10-CM | POA: Diagnosis not present

## 2016-10-03 DIAGNOSIS — F3341 Major depressive disorder, recurrent, in partial remission: Secondary | ICD-10-CM | POA: Diagnosis not present

## 2016-10-04 DIAGNOSIS — S61401D Unspecified open wound of right hand, subsequent encounter: Secondary | ICD-10-CM | POA: Diagnosis not present

## 2016-10-04 DIAGNOSIS — I110 Hypertensive heart disease with heart failure: Secondary | ICD-10-CM | POA: Diagnosis not present

## 2016-10-04 DIAGNOSIS — M4846XD Fatigue fracture of vertebra, lumbar region, subsequent encounter for fracture with routine healing: Secondary | ICD-10-CM | POA: Diagnosis not present

## 2016-10-04 DIAGNOSIS — M4802 Spinal stenosis, cervical region: Secondary | ICD-10-CM | POA: Diagnosis not present

## 2016-10-04 DIAGNOSIS — E1140 Type 2 diabetes mellitus with diabetic neuropathy, unspecified: Secondary | ICD-10-CM | POA: Diagnosis not present

## 2016-10-04 DIAGNOSIS — G20 Parkinson's disease: Secondary | ICD-10-CM | POA: Diagnosis not present

## 2016-10-08 DIAGNOSIS — E1140 Type 2 diabetes mellitus with diabetic neuropathy, unspecified: Secondary | ICD-10-CM | POA: Diagnosis not present

## 2016-10-08 DIAGNOSIS — S61401D Unspecified open wound of right hand, subsequent encounter: Secondary | ICD-10-CM | POA: Diagnosis not present

## 2016-10-08 DIAGNOSIS — M4802 Spinal stenosis, cervical region: Secondary | ICD-10-CM | POA: Diagnosis not present

## 2016-10-08 DIAGNOSIS — M4846XD Fatigue fracture of vertebra, lumbar region, subsequent encounter for fracture with routine healing: Secondary | ICD-10-CM | POA: Diagnosis not present

## 2016-10-08 DIAGNOSIS — I110 Hypertensive heart disease with heart failure: Secondary | ICD-10-CM | POA: Diagnosis not present

## 2016-10-08 DIAGNOSIS — G20 Parkinson's disease: Secondary | ICD-10-CM | POA: Diagnosis not present

## 2016-10-10 DIAGNOSIS — M4846XD Fatigue fracture of vertebra, lumbar region, subsequent encounter for fracture with routine healing: Secondary | ICD-10-CM | POA: Diagnosis not present

## 2016-10-10 DIAGNOSIS — M4802 Spinal stenosis, cervical region: Secondary | ICD-10-CM | POA: Diagnosis not present

## 2016-10-10 DIAGNOSIS — E1140 Type 2 diabetes mellitus with diabetic neuropathy, unspecified: Secondary | ICD-10-CM | POA: Diagnosis not present

## 2016-10-10 DIAGNOSIS — S61401D Unspecified open wound of right hand, subsequent encounter: Secondary | ICD-10-CM | POA: Diagnosis not present

## 2016-10-10 DIAGNOSIS — G20 Parkinson's disease: Secondary | ICD-10-CM | POA: Diagnosis not present

## 2016-10-10 DIAGNOSIS — I110 Hypertensive heart disease with heart failure: Secondary | ICD-10-CM | POA: Diagnosis not present

## 2016-10-15 DIAGNOSIS — M4802 Spinal stenosis, cervical region: Secondary | ICD-10-CM | POA: Diagnosis not present

## 2016-10-15 DIAGNOSIS — S61401D Unspecified open wound of right hand, subsequent encounter: Secondary | ICD-10-CM | POA: Diagnosis not present

## 2016-10-15 DIAGNOSIS — E1140 Type 2 diabetes mellitus with diabetic neuropathy, unspecified: Secondary | ICD-10-CM | POA: Diagnosis not present

## 2016-10-15 DIAGNOSIS — G20 Parkinson's disease: Secondary | ICD-10-CM | POA: Diagnosis not present

## 2016-10-15 DIAGNOSIS — M4846XD Fatigue fracture of vertebra, lumbar region, subsequent encounter for fracture with routine healing: Secondary | ICD-10-CM | POA: Diagnosis not present

## 2016-10-15 DIAGNOSIS — I110 Hypertensive heart disease with heart failure: Secondary | ICD-10-CM | POA: Diagnosis not present

## 2016-10-17 DIAGNOSIS — I110 Hypertensive heart disease with heart failure: Secondary | ICD-10-CM | POA: Diagnosis not present

## 2016-10-17 DIAGNOSIS — M4802 Spinal stenosis, cervical region: Secondary | ICD-10-CM | POA: Diagnosis not present

## 2016-10-17 DIAGNOSIS — E1140 Type 2 diabetes mellitus with diabetic neuropathy, unspecified: Secondary | ICD-10-CM | POA: Diagnosis not present

## 2016-10-17 DIAGNOSIS — S61401D Unspecified open wound of right hand, subsequent encounter: Secondary | ICD-10-CM | POA: Diagnosis not present

## 2016-10-17 DIAGNOSIS — M4846XD Fatigue fracture of vertebra, lumbar region, subsequent encounter for fracture with routine healing: Secondary | ICD-10-CM | POA: Diagnosis not present

## 2016-10-17 DIAGNOSIS — G20 Parkinson's disease: Secondary | ICD-10-CM | POA: Diagnosis not present

## 2016-10-18 DIAGNOSIS — M4802 Spinal stenosis, cervical region: Secondary | ICD-10-CM | POA: Diagnosis not present

## 2016-10-18 DIAGNOSIS — G20 Parkinson's disease: Secondary | ICD-10-CM | POA: Diagnosis not present

## 2016-10-18 DIAGNOSIS — I110 Hypertensive heart disease with heart failure: Secondary | ICD-10-CM | POA: Diagnosis not present

## 2016-10-18 DIAGNOSIS — S61401D Unspecified open wound of right hand, subsequent encounter: Secondary | ICD-10-CM | POA: Diagnosis not present

## 2016-10-18 DIAGNOSIS — E1140 Type 2 diabetes mellitus with diabetic neuropathy, unspecified: Secondary | ICD-10-CM | POA: Diagnosis not present

## 2016-10-18 DIAGNOSIS — M4846XD Fatigue fracture of vertebra, lumbar region, subsequent encounter for fracture with routine healing: Secondary | ICD-10-CM | POA: Diagnosis not present

## 2016-10-23 DIAGNOSIS — I110 Hypertensive heart disease with heart failure: Secondary | ICD-10-CM | POA: Diagnosis not present

## 2016-10-23 DIAGNOSIS — M4802 Spinal stenosis, cervical region: Secondary | ICD-10-CM | POA: Diagnosis not present

## 2016-10-23 DIAGNOSIS — E1140 Type 2 diabetes mellitus with diabetic neuropathy, unspecified: Secondary | ICD-10-CM | POA: Diagnosis not present

## 2016-10-23 DIAGNOSIS — M4846XD Fatigue fracture of vertebra, lumbar region, subsequent encounter for fracture with routine healing: Secondary | ICD-10-CM | POA: Diagnosis not present

## 2016-10-23 DIAGNOSIS — G20 Parkinson's disease: Secondary | ICD-10-CM | POA: Diagnosis not present

## 2016-10-23 DIAGNOSIS — S61401D Unspecified open wound of right hand, subsequent encounter: Secondary | ICD-10-CM | POA: Diagnosis not present

## 2016-10-25 DIAGNOSIS — E1140 Type 2 diabetes mellitus with diabetic neuropathy, unspecified: Secondary | ICD-10-CM | POA: Diagnosis not present

## 2016-10-25 DIAGNOSIS — M4802 Spinal stenosis, cervical region: Secondary | ICD-10-CM | POA: Diagnosis not present

## 2016-10-25 DIAGNOSIS — M4846XD Fatigue fracture of vertebra, lumbar region, subsequent encounter for fracture with routine healing: Secondary | ICD-10-CM | POA: Diagnosis not present

## 2016-10-25 DIAGNOSIS — G20 Parkinson's disease: Secondary | ICD-10-CM | POA: Diagnosis not present

## 2016-10-25 DIAGNOSIS — S61401D Unspecified open wound of right hand, subsequent encounter: Secondary | ICD-10-CM | POA: Diagnosis not present

## 2016-10-25 DIAGNOSIS — I110 Hypertensive heart disease with heart failure: Secondary | ICD-10-CM | POA: Diagnosis not present

## 2016-10-31 DIAGNOSIS — E1142 Type 2 diabetes mellitus with diabetic polyneuropathy: Secondary | ICD-10-CM | POA: Diagnosis not present

## 2016-10-31 DIAGNOSIS — B351 Tinea unguium: Secondary | ICD-10-CM | POA: Diagnosis not present

## 2016-10-31 DIAGNOSIS — D2371 Other benign neoplasm of skin of right lower limb, including hip: Secondary | ICD-10-CM | POA: Diagnosis not present

## 2016-10-31 DIAGNOSIS — Z794 Long term (current) use of insulin: Secondary | ICD-10-CM | POA: Diagnosis not present

## 2016-11-12 DIAGNOSIS — M79604 Pain in right leg: Secondary | ICD-10-CM | POA: Diagnosis not present

## 2016-11-12 DIAGNOSIS — R531 Weakness: Secondary | ICD-10-CM | POA: Diagnosis not present

## 2016-11-13 DIAGNOSIS — R5381 Other malaise: Secondary | ICD-10-CM | POA: Diagnosis not present

## 2016-11-13 DIAGNOSIS — Z794 Long term (current) use of insulin: Secondary | ICD-10-CM | POA: Diagnosis not present

## 2016-11-13 DIAGNOSIS — R279 Unspecified lack of coordination: Secondary | ICD-10-CM | POA: Diagnosis not present

## 2016-11-13 DIAGNOSIS — R937 Abnormal findings on diagnostic imaging of other parts of musculoskeletal system: Secondary | ICD-10-CM | POA: Diagnosis not present

## 2016-11-13 DIAGNOSIS — S99921A Unspecified injury of right foot, initial encounter: Secondary | ICD-10-CM | POA: Diagnosis not present

## 2016-11-13 DIAGNOSIS — G9340 Encephalopathy, unspecified: Secondary | ICD-10-CM | POA: Diagnosis present

## 2016-11-13 DIAGNOSIS — I25110 Atherosclerotic heart disease of native coronary artery with unstable angina pectoris: Secondary | ICD-10-CM | POA: Diagnosis present

## 2016-11-13 DIAGNOSIS — K219 Gastro-esophageal reflux disease without esophagitis: Secondary | ICD-10-CM | POA: Diagnosis present

## 2016-11-13 DIAGNOSIS — G3184 Mild cognitive impairment, so stated: Secondary | ICD-10-CM | POA: Diagnosis present

## 2016-11-13 DIAGNOSIS — M47812 Spondylosis without myelopathy or radiculopathy, cervical region: Secondary | ICD-10-CM | POA: Diagnosis not present

## 2016-11-13 DIAGNOSIS — R159 Full incontinence of feces: Secondary | ICD-10-CM | POA: Diagnosis present

## 2016-11-13 DIAGNOSIS — M62551 Muscle wasting and atrophy, not elsewhere classified, right thigh: Secondary | ICD-10-CM | POA: Diagnosis not present

## 2016-11-13 DIAGNOSIS — N179 Acute kidney failure, unspecified: Secondary | ICD-10-CM | POA: Diagnosis present

## 2016-11-13 DIAGNOSIS — R809 Proteinuria, unspecified: Secondary | ICD-10-CM | POA: Diagnosis present

## 2016-11-13 DIAGNOSIS — M6281 Muscle weakness (generalized): Secondary | ICD-10-CM | POA: Diagnosis not present

## 2016-11-13 DIAGNOSIS — R197 Diarrhea, unspecified: Secondary | ICD-10-CM | POA: Diagnosis present

## 2016-11-13 DIAGNOSIS — S3993XA Unspecified injury of pelvis, initial encounter: Secondary | ICD-10-CM | POA: Diagnosis not present

## 2016-11-13 DIAGNOSIS — R600 Localized edema: Secondary | ICD-10-CM | POA: Diagnosis not present

## 2016-11-13 DIAGNOSIS — R29898 Other symptoms and signs involving the musculoskeletal system: Secondary | ICD-10-CM | POA: Diagnosis not present

## 2016-11-13 DIAGNOSIS — E785 Hyperlipidemia, unspecified: Secondary | ICD-10-CM | POA: Diagnosis present

## 2016-11-13 DIAGNOSIS — I672 Cerebral atherosclerosis: Secondary | ICD-10-CM | POA: Diagnosis not present

## 2016-11-13 DIAGNOSIS — M25551 Pain in right hip: Secondary | ICD-10-CM | POA: Diagnosis not present

## 2016-11-13 DIAGNOSIS — F419 Anxiety disorder, unspecified: Secondary | ICD-10-CM | POA: Diagnosis present

## 2016-11-13 DIAGNOSIS — G0431 Postinfectious acute necrotizing hemorrhagic encephalopathy: Secondary | ICD-10-CM | POA: Diagnosis not present

## 2016-11-13 DIAGNOSIS — Z043 Encounter for examination and observation following other accident: Secondary | ICD-10-CM | POA: Diagnosis not present

## 2016-11-13 DIAGNOSIS — E860 Dehydration: Secondary | ICD-10-CM | POA: Diagnosis present

## 2016-11-13 DIAGNOSIS — R41841 Cognitive communication deficit: Secondary | ICD-10-CM | POA: Diagnosis not present

## 2016-11-13 DIAGNOSIS — Z886 Allergy status to analgesic agent status: Secondary | ICD-10-CM | POA: Diagnosis not present

## 2016-11-13 DIAGNOSIS — M79671 Pain in right foot: Secondary | ICD-10-CM | POA: Diagnosis not present

## 2016-11-13 DIAGNOSIS — Z741 Need for assistance with personal care: Secondary | ICD-10-CM | POA: Diagnosis not present

## 2016-11-13 DIAGNOSIS — M542 Cervicalgia: Secondary | ICD-10-CM | POA: Diagnosis not present

## 2016-11-13 DIAGNOSIS — M5430 Sciatica, unspecified side: Secondary | ICD-10-CM | POA: Diagnosis present

## 2016-11-13 DIAGNOSIS — Z955 Presence of coronary angioplasty implant and graft: Secondary | ICD-10-CM | POA: Diagnosis not present

## 2016-11-13 DIAGNOSIS — R296 Repeated falls: Secondary | ICD-10-CM | POA: Diagnosis not present

## 2016-11-13 DIAGNOSIS — E1140 Type 2 diabetes mellitus with diabetic neuropathy, unspecified: Secondary | ICD-10-CM | POA: Diagnosis not present

## 2016-11-13 DIAGNOSIS — L539 Erythematous condition, unspecified: Secondary | ICD-10-CM | POA: Diagnosis not present

## 2016-11-13 DIAGNOSIS — Z7901 Long term (current) use of anticoagulants: Secondary | ICD-10-CM | POA: Diagnosis not present

## 2016-11-13 DIAGNOSIS — M79604 Pain in right leg: Secondary | ICD-10-CM | POA: Diagnosis not present

## 2016-11-13 DIAGNOSIS — R350 Frequency of micturition: Secondary | ICD-10-CM | POA: Diagnosis present

## 2016-11-13 DIAGNOSIS — E1165 Type 2 diabetes mellitus with hyperglycemia: Secondary | ICD-10-CM | POA: Diagnosis present

## 2016-11-13 DIAGNOSIS — G4733 Obstructive sleep apnea (adult) (pediatric): Secondary | ICD-10-CM | POA: Diagnosis present

## 2016-11-13 DIAGNOSIS — Z8673 Personal history of transient ischemic attack (TIA), and cerebral infarction without residual deficits: Secondary | ICD-10-CM | POA: Diagnosis not present

## 2016-11-13 DIAGNOSIS — R262 Difficulty in walking, not elsewhere classified: Secondary | ICD-10-CM | POA: Diagnosis not present

## 2016-11-13 DIAGNOSIS — L03115 Cellulitis of right lower limb: Secondary | ICD-10-CM | POA: Diagnosis present

## 2016-11-13 DIAGNOSIS — R102 Pelvic and perineal pain: Secondary | ICD-10-CM | POA: Diagnosis not present

## 2016-11-13 DIAGNOSIS — Z7982 Long term (current) use of aspirin: Secondary | ICD-10-CM | POA: Diagnosis not present

## 2016-11-13 DIAGNOSIS — M62552 Muscle wasting and atrophy, not elsewhere classified, left thigh: Secondary | ICD-10-CM | POA: Diagnosis not present

## 2016-11-13 DIAGNOSIS — R0989 Other specified symptoms and signs involving the circulatory and respiratory systems: Secondary | ICD-10-CM | POA: Diagnosis not present

## 2016-11-13 DIAGNOSIS — Z87891 Personal history of nicotine dependence: Secondary | ICD-10-CM | POA: Diagnosis not present

## 2016-11-13 DIAGNOSIS — I10 Essential (primary) hypertension: Secondary | ICD-10-CM | POA: Diagnosis present

## 2016-11-13 DIAGNOSIS — S79921A Unspecified injury of right thigh, initial encounter: Secondary | ICD-10-CM | POA: Diagnosis not present

## 2016-11-17 DIAGNOSIS — R5381 Other malaise: Secondary | ICD-10-CM | POA: Diagnosis not present

## 2016-11-17 DIAGNOSIS — M5001 Cervical disc disorder with myelopathy,  high cervical region: Secondary | ICD-10-CM | POA: Diagnosis not present

## 2016-11-17 DIAGNOSIS — R41841 Cognitive communication deficit: Secondary | ICD-10-CM | POA: Diagnosis not present

## 2016-11-17 DIAGNOSIS — E1165 Type 2 diabetes mellitus with hyperglycemia: Secondary | ICD-10-CM | POA: Diagnosis not present

## 2016-11-17 DIAGNOSIS — R279 Unspecified lack of coordination: Secondary | ICD-10-CM | POA: Diagnosis not present

## 2016-11-17 DIAGNOSIS — I25110 Atherosclerotic heart disease of native coronary artery with unstable angina pectoris: Secondary | ICD-10-CM | POA: Diagnosis not present

## 2016-11-17 DIAGNOSIS — M6281 Muscle weakness (generalized): Secondary | ICD-10-CM | POA: Diagnosis not present

## 2016-11-17 DIAGNOSIS — R197 Diarrhea, unspecified: Secondary | ICD-10-CM | POA: Diagnosis not present

## 2016-11-17 DIAGNOSIS — R262 Difficulty in walking, not elsewhere classified: Secondary | ICD-10-CM | POA: Diagnosis not present

## 2016-11-17 DIAGNOSIS — Z741 Need for assistance with personal care: Secondary | ICD-10-CM | POA: Diagnosis not present

## 2016-11-17 DIAGNOSIS — R809 Proteinuria, unspecified: Secondary | ICD-10-CM | POA: Diagnosis not present

## 2016-11-17 DIAGNOSIS — G20 Parkinson's disease: Secondary | ICD-10-CM | POA: Diagnosis not present

## 2016-11-17 DIAGNOSIS — E1142 Type 2 diabetes mellitus with diabetic polyneuropathy: Secondary | ICD-10-CM | POA: Diagnosis not present

## 2016-11-17 DIAGNOSIS — K219 Gastro-esophageal reflux disease without esophagitis: Secondary | ICD-10-CM | POA: Diagnosis not present

## 2016-11-17 DIAGNOSIS — G9340 Encephalopathy, unspecified: Secondary | ICD-10-CM | POA: Diagnosis not present

## 2016-11-17 DIAGNOSIS — G0431 Postinfectious acute necrotizing hemorrhagic encephalopathy: Secondary | ICD-10-CM | POA: Diagnosis not present

## 2016-11-17 DIAGNOSIS — G3184 Mild cognitive impairment, so stated: Secondary | ICD-10-CM | POA: Diagnosis not present

## 2016-11-17 DIAGNOSIS — L03115 Cellulitis of right lower limb: Secondary | ICD-10-CM | POA: Diagnosis not present

## 2016-11-28 DIAGNOSIS — M5001 Cervical disc disorder with myelopathy,  high cervical region: Secondary | ICD-10-CM | POA: Diagnosis not present

## 2016-11-28 DIAGNOSIS — E1142 Type 2 diabetes mellitus with diabetic polyneuropathy: Secondary | ICD-10-CM | POA: Diagnosis not present

## 2016-11-28 DIAGNOSIS — G20 Parkinson's disease: Secondary | ICD-10-CM | POA: Diagnosis not present

## 2016-11-28 DIAGNOSIS — K219 Gastro-esophageal reflux disease without esophagitis: Secondary | ICD-10-CM | POA: Diagnosis not present

## 2016-12-19 DIAGNOSIS — Z794 Long term (current) use of insulin: Secondary | ICD-10-CM | POA: Diagnosis not present

## 2016-12-19 DIAGNOSIS — E1149 Type 2 diabetes mellitus with other diabetic neurological complication: Secondary | ICD-10-CM | POA: Diagnosis not present

## 2016-12-19 DIAGNOSIS — G629 Polyneuropathy, unspecified: Secondary | ICD-10-CM | POA: Diagnosis not present

## 2016-12-19 DIAGNOSIS — Z1211 Encounter for screening for malignant neoplasm of colon: Secondary | ICD-10-CM | POA: Diagnosis not present

## 2016-12-19 DIAGNOSIS — I10 Essential (primary) hypertension: Secondary | ICD-10-CM | POA: Diagnosis not present

## 2016-12-19 DIAGNOSIS — F3341 Major depressive disorder, recurrent, in partial remission: Secondary | ICD-10-CM | POA: Diagnosis not present

## 2016-12-19 DIAGNOSIS — E785 Hyperlipidemia, unspecified: Secondary | ICD-10-CM | POA: Diagnosis not present

## 2016-12-19 DIAGNOSIS — I2510 Atherosclerotic heart disease of native coronary artery without angina pectoris: Secondary | ICD-10-CM | POA: Diagnosis not present

## 2016-12-19 DIAGNOSIS — R809 Proteinuria, unspecified: Secondary | ICD-10-CM | POA: Diagnosis not present

## 2016-12-19 DIAGNOSIS — Z9119 Patient's noncompliance with other medical treatment and regimen: Secondary | ICD-10-CM | POA: Diagnosis not present

## 2016-12-24 DIAGNOSIS — R05 Cough: Secondary | ICD-10-CM | POA: Diagnosis not present

## 2016-12-24 DIAGNOSIS — J069 Acute upper respiratory infection, unspecified: Secondary | ICD-10-CM | POA: Diagnosis not present

## 2017-01-01 DIAGNOSIS — M5412 Radiculopathy, cervical region: Secondary | ICD-10-CM | POA: Diagnosis not present

## 2017-01-01 DIAGNOSIS — M12811 Other specific arthropathies, not elsewhere classified, right shoulder: Secondary | ICD-10-CM | POA: Diagnosis not present

## 2017-01-09 DIAGNOSIS — Z1211 Encounter for screening for malignant neoplasm of colon: Secondary | ICD-10-CM | POA: Diagnosis not present

## 2017-01-24 DIAGNOSIS — M5412 Radiculopathy, cervical region: Secondary | ICD-10-CM | POA: Diagnosis not present

## 2017-01-24 DIAGNOSIS — Z981 Arthrodesis status: Secondary | ICD-10-CM | POA: Diagnosis not present

## 2017-01-31 ENCOUNTER — Ambulatory Visit: Payer: Medicare Other | Admitting: Cardiovascular Disease

## 2017-03-13 DIAGNOSIS — Z9181 History of falling: Secondary | ICD-10-CM | POA: Diagnosis not present

## 2017-03-13 DIAGNOSIS — E785 Hyperlipidemia, unspecified: Secondary | ICD-10-CM | POA: Diagnosis not present

## 2017-03-13 DIAGNOSIS — E1149 Type 2 diabetes mellitus with other diabetic neurological complication: Secondary | ICD-10-CM | POA: Diagnosis not present

## 2017-03-13 DIAGNOSIS — I10 Essential (primary) hypertension: Secondary | ICD-10-CM | POA: Diagnosis not present

## 2017-03-13 DIAGNOSIS — L03115 Cellulitis of right lower limb: Secondary | ICD-10-CM | POA: Diagnosis not present

## 2017-03-13 DIAGNOSIS — Z885 Allergy status to narcotic agent status: Secondary | ICD-10-CM | POA: Diagnosis not present

## 2017-03-13 DIAGNOSIS — Z9049 Acquired absence of other specified parts of digestive tract: Secondary | ICD-10-CM | POA: Diagnosis not present

## 2017-03-13 DIAGNOSIS — I2510 Atherosclerotic heart disease of native coronary artery without angina pectoris: Secondary | ICD-10-CM | POA: Diagnosis not present

## 2017-03-13 DIAGNOSIS — R3981 Functional urinary incontinence: Secondary | ICD-10-CM | POA: Diagnosis not present

## 2017-03-13 DIAGNOSIS — M6281 Muscle weakness (generalized): Secondary | ICD-10-CM | POA: Diagnosis not present

## 2017-03-13 DIAGNOSIS — Z7902 Long term (current) use of antithrombotics/antiplatelets: Secondary | ICD-10-CM | POA: Diagnosis not present

## 2017-03-13 DIAGNOSIS — Z87891 Personal history of nicotine dependence: Secondary | ICD-10-CM | POA: Diagnosis not present

## 2017-03-13 DIAGNOSIS — Z79899 Other long term (current) drug therapy: Secondary | ICD-10-CM | POA: Diagnosis not present

## 2017-03-13 DIAGNOSIS — Z7982 Long term (current) use of aspirin: Secondary | ICD-10-CM | POA: Diagnosis not present

## 2017-03-13 DIAGNOSIS — G4733 Obstructive sleep apnea (adult) (pediatric): Secondary | ICD-10-CM | POA: Diagnosis not present

## 2017-03-13 DIAGNOSIS — Z9989 Dependence on other enabling machines and devices: Secondary | ICD-10-CM | POA: Diagnosis not present

## 2017-03-13 DIAGNOSIS — Z794 Long term (current) use of insulin: Secondary | ICD-10-CM | POA: Diagnosis not present

## 2017-03-13 DIAGNOSIS — F3341 Major depressive disorder, recurrent, in partial remission: Secondary | ICD-10-CM | POA: Diagnosis not present

## 2017-03-13 DIAGNOSIS — Z955 Presence of coronary angioplasty implant and graft: Secondary | ICD-10-CM | POA: Diagnosis not present

## 2017-03-18 ENCOUNTER — Ambulatory Visit: Payer: Medicare Other | Admitting: Cardiovascular Disease

## 2017-04-01 DIAGNOSIS — M47812 Spondylosis without myelopathy or radiculopathy, cervical region: Secondary | ICD-10-CM | POA: Diagnosis not present

## 2017-04-01 DIAGNOSIS — M12811 Other specific arthropathies, not elsewhere classified, right shoulder: Secondary | ICD-10-CM | POA: Diagnosis not present

## 2017-04-10 DIAGNOSIS — M542 Cervicalgia: Secondary | ICD-10-CM | POA: Diagnosis not present

## 2017-04-10 DIAGNOSIS — M25511 Pain in right shoulder: Secondary | ICD-10-CM | POA: Diagnosis not present

## 2017-04-19 DIAGNOSIS — M542 Cervicalgia: Secondary | ICD-10-CM | POA: Diagnosis not present

## 2017-04-19 DIAGNOSIS — M25511 Pain in right shoulder: Secondary | ICD-10-CM | POA: Diagnosis not present

## 2017-04-22 DIAGNOSIS — M542 Cervicalgia: Secondary | ICD-10-CM | POA: Diagnosis not present

## 2017-04-22 DIAGNOSIS — M25511 Pain in right shoulder: Secondary | ICD-10-CM | POA: Diagnosis not present

## 2017-04-24 DIAGNOSIS — M542 Cervicalgia: Secondary | ICD-10-CM | POA: Diagnosis not present

## 2017-04-24 DIAGNOSIS — M25511 Pain in right shoulder: Secondary | ICD-10-CM | POA: Diagnosis not present

## 2017-04-29 DIAGNOSIS — M542 Cervicalgia: Secondary | ICD-10-CM | POA: Diagnosis not present

## 2017-04-29 DIAGNOSIS — M25511 Pain in right shoulder: Secondary | ICD-10-CM | POA: Diagnosis not present

## 2017-05-01 DIAGNOSIS — M542 Cervicalgia: Secondary | ICD-10-CM | POA: Diagnosis not present

## 2017-05-01 DIAGNOSIS — M25511 Pain in right shoulder: Secondary | ICD-10-CM | POA: Diagnosis not present

## 2017-05-03 DIAGNOSIS — M542 Cervicalgia: Secondary | ICD-10-CM | POA: Diagnosis not present

## 2017-05-03 DIAGNOSIS — M25511 Pain in right shoulder: Secondary | ICD-10-CM | POA: Diagnosis not present

## 2017-05-08 DIAGNOSIS — M542 Cervicalgia: Secondary | ICD-10-CM | POA: Diagnosis not present

## 2017-05-08 DIAGNOSIS — M25511 Pain in right shoulder: Secondary | ICD-10-CM | POA: Diagnosis not present

## 2017-05-13 DIAGNOSIS — M542 Cervicalgia: Secondary | ICD-10-CM | POA: Diagnosis not present

## 2017-05-13 DIAGNOSIS — M25511 Pain in right shoulder: Secondary | ICD-10-CM | POA: Diagnosis not present

## 2017-05-14 DIAGNOSIS — I2510 Atherosclerotic heart disease of native coronary artery without angina pectoris: Secondary | ICD-10-CM | POA: Diagnosis not present

## 2017-05-14 DIAGNOSIS — E1149 Type 2 diabetes mellitus with other diabetic neurological complication: Secondary | ICD-10-CM | POA: Diagnosis not present

## 2017-05-14 DIAGNOSIS — I10 Essential (primary) hypertension: Secondary | ICD-10-CM | POA: Diagnosis not present

## 2017-05-14 DIAGNOSIS — Z794 Long term (current) use of insulin: Secondary | ICD-10-CM | POA: Diagnosis not present

## 2017-05-14 DIAGNOSIS — G3184 Mild cognitive impairment, so stated: Secondary | ICD-10-CM | POA: Diagnosis not present

## 2017-05-14 DIAGNOSIS — R600 Localized edema: Secondary | ICD-10-CM | POA: Diagnosis not present

## 2017-05-16 DIAGNOSIS — M542 Cervicalgia: Secondary | ICD-10-CM | POA: Diagnosis not present

## 2017-05-16 DIAGNOSIS — M25511 Pain in right shoulder: Secondary | ICD-10-CM | POA: Diagnosis not present

## 2017-05-19 DIAGNOSIS — L03116 Cellulitis of left lower limb: Secondary | ICD-10-CM | POA: Diagnosis not present

## 2017-05-19 DIAGNOSIS — E785 Hyperlipidemia, unspecified: Secondary | ICD-10-CM | POA: Diagnosis present

## 2017-05-19 DIAGNOSIS — R600 Localized edema: Secondary | ICD-10-CM | POA: Diagnosis not present

## 2017-05-19 DIAGNOSIS — Z7982 Long term (current) use of aspirin: Secondary | ICD-10-CM | POA: Diagnosis not present

## 2017-05-19 DIAGNOSIS — Z7984 Long term (current) use of oral hypoglycemic drugs: Secondary | ICD-10-CM | POA: Diagnosis not present

## 2017-05-19 DIAGNOSIS — K219 Gastro-esophageal reflux disease without esophagitis: Secondary | ICD-10-CM | POA: Diagnosis present

## 2017-05-19 DIAGNOSIS — Z87891 Personal history of nicotine dependence: Secondary | ICD-10-CM | POA: Diagnosis not present

## 2017-05-19 DIAGNOSIS — F3341 Major depressive disorder, recurrent, in partial remission: Secondary | ICD-10-CM | POA: Diagnosis not present

## 2017-05-19 DIAGNOSIS — E1140 Type 2 diabetes mellitus with diabetic neuropathy, unspecified: Secondary | ICD-10-CM | POA: Diagnosis not present

## 2017-05-19 DIAGNOSIS — I872 Venous insufficiency (chronic) (peripheral): Secondary | ICD-10-CM | POA: Diagnosis present

## 2017-05-19 DIAGNOSIS — I440 Atrioventricular block, first degree: Secondary | ICD-10-CM | POA: Diagnosis not present

## 2017-05-19 DIAGNOSIS — L03129 Acute lymphangitis of unspecified part of limb: Secondary | ICD-10-CM | POA: Diagnosis not present

## 2017-05-19 DIAGNOSIS — M6281 Muscle weakness (generalized): Secondary | ICD-10-CM | POA: Diagnosis not present

## 2017-05-19 DIAGNOSIS — R0789 Other chest pain: Secondary | ICD-10-CM | POA: Diagnosis not present

## 2017-05-19 DIAGNOSIS — R296 Repeated falls: Secondary | ICD-10-CM | POA: Diagnosis not present

## 2017-05-19 DIAGNOSIS — Z955 Presence of coronary angioplasty implant and graft: Secondary | ICD-10-CM | POA: Diagnosis not present

## 2017-05-19 DIAGNOSIS — E119 Type 2 diabetes mellitus without complications: Secondary | ICD-10-CM | POA: Diagnosis not present

## 2017-05-19 DIAGNOSIS — I83029 Varicose veins of left lower extremity with ulcer of unspecified site: Secondary | ICD-10-CM | POA: Diagnosis not present

## 2017-05-19 DIAGNOSIS — G4733 Obstructive sleep apnea (adult) (pediatric): Secondary | ICD-10-CM | POA: Diagnosis present

## 2017-05-19 DIAGNOSIS — E1142 Type 2 diabetes mellitus with diabetic polyneuropathy: Secondary | ICD-10-CM | POA: Diagnosis not present

## 2017-05-19 DIAGNOSIS — F0150 Vascular dementia without behavioral disturbance: Secondary | ICD-10-CM | POA: Diagnosis present

## 2017-05-19 DIAGNOSIS — M7989 Other specified soft tissue disorders: Secondary | ICD-10-CM | POA: Diagnosis not present

## 2017-05-19 DIAGNOSIS — I517 Cardiomegaly: Secondary | ICD-10-CM | POA: Diagnosis not present

## 2017-05-19 DIAGNOSIS — L03115 Cellulitis of right lower limb: Secondary | ICD-10-CM | POA: Diagnosis not present

## 2017-05-19 DIAGNOSIS — Z8673 Personal history of transient ischemic attack (TIA), and cerebral infarction without residual deficits: Secondary | ICD-10-CM | POA: Diagnosis not present

## 2017-05-19 DIAGNOSIS — R269 Unspecified abnormalities of gait and mobility: Secondary | ICD-10-CM | POA: Diagnosis not present

## 2017-05-19 DIAGNOSIS — E0840 Diabetes mellitus due to underlying condition with diabetic neuropathy, unspecified: Secondary | ICD-10-CM | POA: Diagnosis not present

## 2017-05-19 DIAGNOSIS — Z7902 Long term (current) use of antithrombotics/antiplatelets: Secondary | ICD-10-CM | POA: Diagnosis not present

## 2017-05-19 DIAGNOSIS — I700 Atherosclerosis of aorta: Secondary | ICD-10-CM | POA: Diagnosis not present

## 2017-05-19 DIAGNOSIS — M4802 Spinal stenosis, cervical region: Secondary | ICD-10-CM | POA: Diagnosis present

## 2017-05-19 DIAGNOSIS — R262 Difficulty in walking, not elsewhere classified: Secondary | ICD-10-CM | POA: Diagnosis not present

## 2017-05-19 DIAGNOSIS — R079 Chest pain, unspecified: Secondary | ICD-10-CM | POA: Diagnosis not present

## 2017-05-19 DIAGNOSIS — I2510 Atherosclerotic heart disease of native coronary artery without angina pectoris: Secondary | ICD-10-CM | POA: Diagnosis not present

## 2017-05-19 DIAGNOSIS — R41841 Cognitive communication deficit: Secondary | ICD-10-CM | POA: Diagnosis not present

## 2017-05-19 DIAGNOSIS — Z794 Long term (current) use of insulin: Secondary | ICD-10-CM | POA: Diagnosis not present

## 2017-05-19 DIAGNOSIS — I878 Other specified disorders of veins: Secondary | ICD-10-CM | POA: Diagnosis present

## 2017-05-19 DIAGNOSIS — I10 Essential (primary) hypertension: Secondary | ICD-10-CM | POA: Diagnosis not present

## 2017-05-19 DIAGNOSIS — M5430 Sciatica, unspecified side: Secondary | ICD-10-CM | POA: Diagnosis present

## 2017-05-19 DIAGNOSIS — D509 Iron deficiency anemia, unspecified: Secondary | ICD-10-CM | POA: Diagnosis present

## 2017-05-19 DIAGNOSIS — E1165 Type 2 diabetes mellitus with hyperglycemia: Secondary | ICD-10-CM | POA: Diagnosis not present

## 2017-05-19 DIAGNOSIS — I25118 Atherosclerotic heart disease of native coronary artery with other forms of angina pectoris: Secondary | ICD-10-CM | POA: Diagnosis not present

## 2017-05-23 DIAGNOSIS — E119 Type 2 diabetes mellitus without complications: Secondary | ICD-10-CM | POA: Diagnosis not present

## 2017-05-23 DIAGNOSIS — R41841 Cognitive communication deficit: Secondary | ICD-10-CM | POA: Diagnosis not present

## 2017-05-23 DIAGNOSIS — L03115 Cellulitis of right lower limb: Secondary | ICD-10-CM | POA: Diagnosis not present

## 2017-05-23 DIAGNOSIS — R269 Unspecified abnormalities of gait and mobility: Secondary | ICD-10-CM | POA: Diagnosis not present

## 2017-05-23 DIAGNOSIS — E1151 Type 2 diabetes mellitus with diabetic peripheral angiopathy without gangrene: Secondary | ICD-10-CM | POA: Diagnosis not present

## 2017-05-23 DIAGNOSIS — R262 Difficulty in walking, not elsewhere classified: Secondary | ICD-10-CM | POA: Diagnosis not present

## 2017-05-23 DIAGNOSIS — L03129 Acute lymphangitis of unspecified part of limb: Secondary | ICD-10-CM | POA: Diagnosis not present

## 2017-05-23 DIAGNOSIS — I10 Essential (primary) hypertension: Secondary | ICD-10-CM | POA: Diagnosis not present

## 2017-05-23 DIAGNOSIS — E1122 Type 2 diabetes mellitus with diabetic chronic kidney disease: Secondary | ICD-10-CM | POA: Diagnosis not present

## 2017-05-23 DIAGNOSIS — I2510 Atherosclerotic heart disease of native coronary artery without angina pectoris: Secondary | ICD-10-CM | POA: Diagnosis not present

## 2017-05-23 DIAGNOSIS — G4733 Obstructive sleep apnea (adult) (pediatric): Secondary | ICD-10-CM | POA: Diagnosis not present

## 2017-05-23 DIAGNOSIS — R296 Repeated falls: Secondary | ICD-10-CM | POA: Diagnosis not present

## 2017-05-23 DIAGNOSIS — B351 Tinea unguium: Secondary | ICD-10-CM | POA: Diagnosis not present

## 2017-05-23 DIAGNOSIS — I739 Peripheral vascular disease, unspecified: Secondary | ICD-10-CM | POA: Diagnosis not present

## 2017-05-23 DIAGNOSIS — L03116 Cellulitis of left lower limb: Secondary | ICD-10-CM | POA: Diagnosis not present

## 2017-05-23 DIAGNOSIS — E1142 Type 2 diabetes mellitus with diabetic polyneuropathy: Secondary | ICD-10-CM | POA: Diagnosis not present

## 2017-05-23 DIAGNOSIS — D2371 Other benign neoplasm of skin of right lower limb, including hip: Secondary | ICD-10-CM | POA: Diagnosis not present

## 2017-05-23 DIAGNOSIS — E1140 Type 2 diabetes mellitus with diabetic neuropathy, unspecified: Secondary | ICD-10-CM | POA: Diagnosis not present

## 2017-05-23 DIAGNOSIS — D509 Iron deficiency anemia, unspecified: Secondary | ICD-10-CM | POA: Diagnosis not present

## 2017-05-23 DIAGNOSIS — I25118 Atherosclerotic heart disease of native coronary artery with other forms of angina pectoris: Secondary | ICD-10-CM | POA: Diagnosis not present

## 2017-05-23 DIAGNOSIS — Z794 Long term (current) use of insulin: Secondary | ICD-10-CM | POA: Diagnosis not present

## 2017-05-23 DIAGNOSIS — E0840 Diabetes mellitus due to underlying condition with diabetic neuropathy, unspecified: Secondary | ICD-10-CM | POA: Diagnosis not present

## 2017-05-23 DIAGNOSIS — M6281 Muscle weakness (generalized): Secondary | ICD-10-CM | POA: Diagnosis not present

## 2017-05-23 DIAGNOSIS — G20 Parkinson's disease: Secondary | ICD-10-CM | POA: Diagnosis not present

## 2017-05-29 DIAGNOSIS — E1122 Type 2 diabetes mellitus with diabetic chronic kidney disease: Secondary | ICD-10-CM | POA: Diagnosis not present

## 2017-05-29 DIAGNOSIS — G4733 Obstructive sleep apnea (adult) (pediatric): Secondary | ICD-10-CM | POA: Diagnosis not present

## 2017-05-29 DIAGNOSIS — G20 Parkinson's disease: Secondary | ICD-10-CM | POA: Diagnosis not present

## 2017-05-29 DIAGNOSIS — I2510 Atherosclerotic heart disease of native coronary artery without angina pectoris: Secondary | ICD-10-CM | POA: Diagnosis not present

## 2017-06-10 DIAGNOSIS — E1151 Type 2 diabetes mellitus with diabetic peripheral angiopathy without gangrene: Secondary | ICD-10-CM | POA: Diagnosis not present

## 2017-06-10 DIAGNOSIS — Z794 Long term (current) use of insulin: Secondary | ICD-10-CM | POA: Diagnosis not present

## 2017-06-10 DIAGNOSIS — E1142 Type 2 diabetes mellitus with diabetic polyneuropathy: Secondary | ICD-10-CM | POA: Diagnosis not present

## 2017-06-10 DIAGNOSIS — D2371 Other benign neoplasm of skin of right lower limb, including hip: Secondary | ICD-10-CM | POA: Diagnosis not present

## 2017-06-10 DIAGNOSIS — I739 Peripheral vascular disease, unspecified: Secondary | ICD-10-CM | POA: Diagnosis not present

## 2017-06-10 DIAGNOSIS — B351 Tinea unguium: Secondary | ICD-10-CM | POA: Diagnosis not present

## 2017-07-01 DIAGNOSIS — M25521 Pain in right elbow: Secondary | ICD-10-CM | POA: Diagnosis not present

## 2017-07-01 DIAGNOSIS — S199XXA Unspecified injury of neck, initial encounter: Secondary | ICD-10-CM | POA: Diagnosis not present

## 2017-07-01 DIAGNOSIS — M6281 Muscle weakness (generalized): Secondary | ICD-10-CM | POA: Diagnosis not present

## 2017-07-01 DIAGNOSIS — Z885 Allergy status to narcotic agent status: Secondary | ICD-10-CM | POA: Diagnosis not present

## 2017-07-01 DIAGNOSIS — M79601 Pain in right arm: Secondary | ICD-10-CM | POA: Diagnosis not present

## 2017-07-01 DIAGNOSIS — M4802 Spinal stenosis, cervical region: Secondary | ICD-10-CM | POA: Diagnosis not present

## 2017-07-01 DIAGNOSIS — M7989 Other specified soft tissue disorders: Secondary | ICD-10-CM | POA: Diagnosis not present

## 2017-07-01 DIAGNOSIS — I517 Cardiomegaly: Secondary | ICD-10-CM | POA: Diagnosis not present

## 2017-07-01 DIAGNOSIS — I440 Atrioventricular block, first degree: Secondary | ICD-10-CM | POA: Diagnosis not present

## 2017-07-01 DIAGNOSIS — Z7982 Long term (current) use of aspirin: Secondary | ICD-10-CM | POA: Diagnosis not present

## 2017-07-01 DIAGNOSIS — F0390 Unspecified dementia without behavioral disturbance: Secondary | ICD-10-CM | POA: Diagnosis not present

## 2017-07-01 DIAGNOSIS — I2510 Atherosclerotic heart disease of native coronary artery without angina pectoris: Secondary | ICD-10-CM | POA: Diagnosis not present

## 2017-07-01 DIAGNOSIS — S0990XA Unspecified injury of head, initial encounter: Secondary | ICD-10-CM | POA: Diagnosis not present

## 2017-07-01 DIAGNOSIS — E785 Hyperlipidemia, unspecified: Secondary | ICD-10-CM | POA: Diagnosis not present

## 2017-07-01 DIAGNOSIS — R32 Unspecified urinary incontinence: Secondary | ICD-10-CM | POA: Diagnosis not present

## 2017-07-01 DIAGNOSIS — G319 Degenerative disease of nervous system, unspecified: Secondary | ICD-10-CM | POA: Diagnosis not present

## 2017-07-01 DIAGNOSIS — I10 Essential (primary) hypertension: Secondary | ICD-10-CM | POA: Diagnosis not present

## 2017-07-01 DIAGNOSIS — E119 Type 2 diabetes mellitus without complications: Secondary | ICD-10-CM | POA: Diagnosis not present

## 2017-07-01 DIAGNOSIS — Z955 Presence of coronary angioplasty implant and graft: Secondary | ICD-10-CM | POA: Diagnosis not present

## 2017-07-01 DIAGNOSIS — M47814 Spondylosis without myelopathy or radiculopathy, thoracic region: Secondary | ICD-10-CM | POA: Diagnosis not present

## 2017-07-01 DIAGNOSIS — Z794 Long term (current) use of insulin: Secondary | ICD-10-CM | POA: Diagnosis not present

## 2017-07-01 DIAGNOSIS — R51 Headache: Secondary | ICD-10-CM | POA: Diagnosis not present

## 2017-07-01 DIAGNOSIS — M19011 Primary osteoarthritis, right shoulder: Secondary | ICD-10-CM | POA: Diagnosis not present

## 2017-07-01 DIAGNOSIS — R531 Weakness: Secondary | ICD-10-CM | POA: Diagnosis not present

## 2017-07-01 DIAGNOSIS — M542 Cervicalgia: Secondary | ICD-10-CM | POA: Diagnosis not present

## 2017-07-01 DIAGNOSIS — D509 Iron deficiency anemia, unspecified: Secondary | ICD-10-CM | POA: Diagnosis not present

## 2017-07-01 DIAGNOSIS — R110 Nausea: Secondary | ICD-10-CM | POA: Diagnosis not present

## 2017-07-01 DIAGNOSIS — M47819 Spondylosis without myelopathy or radiculopathy, site unspecified: Secondary | ICD-10-CM | POA: Diagnosis not present

## 2017-07-01 DIAGNOSIS — R600 Localized edema: Secondary | ICD-10-CM | POA: Diagnosis not present

## 2017-07-01 DIAGNOSIS — M5030 Other cervical disc degeneration, unspecified cervical region: Secondary | ICD-10-CM | POA: Diagnosis not present

## 2017-07-01 DIAGNOSIS — I499 Cardiac arrhythmia, unspecified: Secondary | ICD-10-CM | POA: Diagnosis not present

## 2017-07-01 DIAGNOSIS — Z8673 Personal history of transient ischemic attack (TIA), and cerebral infarction without residual deficits: Secondary | ICD-10-CM | POA: Diagnosis not present

## 2017-07-01 DIAGNOSIS — Z87891 Personal history of nicotine dependence: Secondary | ICD-10-CM | POA: Diagnosis not present

## 2017-07-01 DIAGNOSIS — R29898 Other symptoms and signs involving the musculoskeletal system: Secondary | ICD-10-CM | POA: Diagnosis not present

## 2017-07-01 DIAGNOSIS — S299XXA Unspecified injury of thorax, initial encounter: Secondary | ICD-10-CM | POA: Diagnosis not present

## 2017-07-01 DIAGNOSIS — G4733 Obstructive sleep apnea (adult) (pediatric): Secondary | ICD-10-CM | POA: Diagnosis not present

## 2017-07-01 DIAGNOSIS — F3341 Major depressive disorder, recurrent, in partial remission: Secondary | ICD-10-CM | POA: Diagnosis not present

## 2017-07-02 DIAGNOSIS — M6281 Muscle weakness (generalized): Secondary | ICD-10-CM | POA: Diagnosis not present

## 2017-07-02 DIAGNOSIS — I517 Cardiomegaly: Secondary | ICD-10-CM | POA: Diagnosis not present

## 2017-07-02 DIAGNOSIS — M5030 Other cervical disc degeneration, unspecified cervical region: Secondary | ICD-10-CM | POA: Diagnosis not present

## 2017-07-02 DIAGNOSIS — M25521 Pain in right elbow: Secondary | ICD-10-CM | POA: Diagnosis not present

## 2017-07-02 DIAGNOSIS — I440 Atrioventricular block, first degree: Secondary | ICD-10-CM | POA: Diagnosis not present

## 2017-07-02 DIAGNOSIS — I499 Cardiac arrhythmia, unspecified: Secondary | ICD-10-CM | POA: Diagnosis not present

## 2017-07-02 DIAGNOSIS — M7989 Other specified soft tissue disorders: Secondary | ICD-10-CM | POA: Diagnosis not present

## 2017-07-02 DIAGNOSIS — M47814 Spondylosis without myelopathy or radiculopathy, thoracic region: Secondary | ICD-10-CM | POA: Diagnosis not present

## 2017-07-05 DIAGNOSIS — R600 Localized edema: Secondary | ICD-10-CM | POA: Diagnosis not present

## 2017-07-05 DIAGNOSIS — Z79899 Other long term (current) drug therapy: Secondary | ICD-10-CM | POA: Diagnosis not present

## 2017-07-05 DIAGNOSIS — F3341 Major depressive disorder, recurrent, in partial remission: Secondary | ICD-10-CM | POA: Diagnosis not present

## 2017-07-05 DIAGNOSIS — Z7982 Long term (current) use of aspirin: Secondary | ICD-10-CM | POA: Diagnosis not present

## 2017-07-05 DIAGNOSIS — E1142 Type 2 diabetes mellitus with diabetic polyneuropathy: Secondary | ICD-10-CM | POA: Diagnosis not present

## 2017-07-05 DIAGNOSIS — I2510 Atherosclerotic heart disease of native coronary artery without angina pectoris: Secondary | ICD-10-CM | POA: Diagnosis not present

## 2017-07-05 DIAGNOSIS — Z87891 Personal history of nicotine dependence: Secondary | ICD-10-CM | POA: Diagnosis not present

## 2017-07-05 DIAGNOSIS — M5136 Other intervertebral disc degeneration, lumbar region: Secondary | ICD-10-CM | POA: Diagnosis not present

## 2017-07-05 DIAGNOSIS — E1151 Type 2 diabetes mellitus with diabetic peripheral angiopathy without gangrene: Secondary | ICD-10-CM | POA: Diagnosis not present

## 2017-07-05 DIAGNOSIS — Z9181 History of falling: Secondary | ICD-10-CM | POA: Diagnosis not present

## 2017-07-05 DIAGNOSIS — G8191 Hemiplegia, unspecified affecting right dominant side: Secondary | ICD-10-CM | POA: Diagnosis not present

## 2017-07-05 DIAGNOSIS — M169 Osteoarthritis of hip, unspecified: Secondary | ICD-10-CM | POA: Diagnosis not present

## 2017-07-05 DIAGNOSIS — Z8673 Personal history of transient ischemic attack (TIA), and cerebral infarction without residual deficits: Secondary | ICD-10-CM | POA: Diagnosis not present

## 2017-07-05 DIAGNOSIS — Z794 Long term (current) use of insulin: Secondary | ICD-10-CM | POA: Diagnosis not present

## 2017-07-05 DIAGNOSIS — I10 Essential (primary) hypertension: Secondary | ICD-10-CM | POA: Diagnosis not present

## 2017-07-05 DIAGNOSIS — Z7902 Long term (current) use of antithrombotics/antiplatelets: Secondary | ICD-10-CM | POA: Diagnosis not present

## 2017-07-05 DIAGNOSIS — D509 Iron deficiency anemia, unspecified: Secondary | ICD-10-CM | POA: Diagnosis not present

## 2017-07-05 DIAGNOSIS — M4802 Spinal stenosis, cervical region: Secondary | ICD-10-CM | POA: Diagnosis not present

## 2017-07-05 DIAGNOSIS — F0390 Unspecified dementia without behavioral disturbance: Secondary | ICD-10-CM | POA: Diagnosis not present

## 2017-07-09 DIAGNOSIS — Z79899 Other long term (current) drug therapy: Secondary | ICD-10-CM | POA: Diagnosis not present

## 2017-07-09 DIAGNOSIS — E1159 Type 2 diabetes mellitus with other circulatory complications: Secondary | ICD-10-CM | POA: Diagnosis not present

## 2017-07-09 DIAGNOSIS — R51 Headache: Secondary | ICD-10-CM | POA: Diagnosis not present

## 2017-07-09 DIAGNOSIS — Z872 Personal history of diseases of the skin and subcutaneous tissue: Secondary | ICD-10-CM | POA: Diagnosis not present

## 2017-07-09 DIAGNOSIS — I2510 Atherosclerotic heart disease of native coronary artery without angina pectoris: Secondary | ICD-10-CM | POA: Diagnosis not present

## 2017-07-09 DIAGNOSIS — Z8619 Personal history of other infectious and parasitic diseases: Secondary | ICD-10-CM | POA: Diagnosis not present

## 2017-07-09 DIAGNOSIS — M5136 Other intervertebral disc degeneration, lumbar region: Secondary | ICD-10-CM | POA: Diagnosis not present

## 2017-07-09 DIAGNOSIS — E1151 Type 2 diabetes mellitus with diabetic peripheral angiopathy without gangrene: Secondary | ICD-10-CM | POA: Diagnosis not present

## 2017-07-09 DIAGNOSIS — I872 Venous insufficiency (chronic) (peripheral): Secondary | ICD-10-CM | POA: Diagnosis not present

## 2017-07-09 DIAGNOSIS — I10 Essential (primary) hypertension: Secondary | ICD-10-CM | POA: Diagnosis not present

## 2017-07-09 DIAGNOSIS — R296 Repeated falls: Secondary | ICD-10-CM | POA: Diagnosis not present

## 2017-07-09 DIAGNOSIS — R600 Localized edema: Secondary | ICD-10-CM | POA: Diagnosis not present

## 2017-07-09 DIAGNOSIS — E1142 Type 2 diabetes mellitus with diabetic polyneuropathy: Secondary | ICD-10-CM | POA: Diagnosis not present

## 2017-07-09 DIAGNOSIS — G8191 Hemiplegia, unspecified affecting right dominant side: Secondary | ICD-10-CM | POA: Diagnosis not present

## 2017-07-09 DIAGNOSIS — M4802 Spinal stenosis, cervical region: Secondary | ICD-10-CM | POA: Diagnosis not present

## 2017-07-11 DIAGNOSIS — I2510 Atherosclerotic heart disease of native coronary artery without angina pectoris: Secondary | ICD-10-CM | POA: Diagnosis not present

## 2017-07-11 DIAGNOSIS — G8191 Hemiplegia, unspecified affecting right dominant side: Secondary | ICD-10-CM | POA: Diagnosis not present

## 2017-07-11 DIAGNOSIS — M5136 Other intervertebral disc degeneration, lumbar region: Secondary | ICD-10-CM | POA: Diagnosis not present

## 2017-07-11 DIAGNOSIS — E1142 Type 2 diabetes mellitus with diabetic polyneuropathy: Secondary | ICD-10-CM | POA: Diagnosis not present

## 2017-07-11 DIAGNOSIS — E1151 Type 2 diabetes mellitus with diabetic peripheral angiopathy without gangrene: Secondary | ICD-10-CM | POA: Diagnosis not present

## 2017-07-11 DIAGNOSIS — M4802 Spinal stenosis, cervical region: Secondary | ICD-10-CM | POA: Diagnosis not present

## 2017-07-12 DIAGNOSIS — M4802 Spinal stenosis, cervical region: Secondary | ICD-10-CM | POA: Diagnosis not present

## 2017-07-12 DIAGNOSIS — I2510 Atherosclerotic heart disease of native coronary artery without angina pectoris: Secondary | ICD-10-CM | POA: Diagnosis not present

## 2017-07-12 DIAGNOSIS — G8191 Hemiplegia, unspecified affecting right dominant side: Secondary | ICD-10-CM | POA: Diagnosis not present

## 2017-07-12 DIAGNOSIS — E1142 Type 2 diabetes mellitus with diabetic polyneuropathy: Secondary | ICD-10-CM | POA: Diagnosis not present

## 2017-07-12 DIAGNOSIS — I10 Essential (primary) hypertension: Secondary | ICD-10-CM | POA: Diagnosis not present

## 2017-07-12 DIAGNOSIS — M5136 Other intervertebral disc degeneration, lumbar region: Secondary | ICD-10-CM | POA: Diagnosis not present

## 2017-07-12 DIAGNOSIS — E1151 Type 2 diabetes mellitus with diabetic peripheral angiopathy without gangrene: Secondary | ICD-10-CM | POA: Diagnosis not present

## 2017-07-15 DIAGNOSIS — E1151 Type 2 diabetes mellitus with diabetic peripheral angiopathy without gangrene: Secondary | ICD-10-CM | POA: Diagnosis not present

## 2017-07-15 DIAGNOSIS — G8191 Hemiplegia, unspecified affecting right dominant side: Secondary | ICD-10-CM | POA: Diagnosis not present

## 2017-07-15 DIAGNOSIS — M4802 Spinal stenosis, cervical region: Secondary | ICD-10-CM | POA: Diagnosis not present

## 2017-07-15 DIAGNOSIS — E1142 Type 2 diabetes mellitus with diabetic polyneuropathy: Secondary | ICD-10-CM | POA: Diagnosis not present

## 2017-07-15 DIAGNOSIS — M5136 Other intervertebral disc degeneration, lumbar region: Secondary | ICD-10-CM | POA: Diagnosis not present

## 2017-07-15 DIAGNOSIS — I2510 Atherosclerotic heart disease of native coronary artery without angina pectoris: Secondary | ICD-10-CM | POA: Diagnosis not present

## 2017-07-16 DIAGNOSIS — G8191 Hemiplegia, unspecified affecting right dominant side: Secondary | ICD-10-CM | POA: Diagnosis not present

## 2017-07-16 DIAGNOSIS — M5136 Other intervertebral disc degeneration, lumbar region: Secondary | ICD-10-CM | POA: Diagnosis not present

## 2017-07-16 DIAGNOSIS — E1151 Type 2 diabetes mellitus with diabetic peripheral angiopathy without gangrene: Secondary | ICD-10-CM | POA: Diagnosis not present

## 2017-07-16 DIAGNOSIS — E1142 Type 2 diabetes mellitus with diabetic polyneuropathy: Secondary | ICD-10-CM | POA: Diagnosis not present

## 2017-07-16 DIAGNOSIS — M4802 Spinal stenosis, cervical region: Secondary | ICD-10-CM | POA: Diagnosis not present

## 2017-07-16 DIAGNOSIS — I2510 Atherosclerotic heart disease of native coronary artery without angina pectoris: Secondary | ICD-10-CM | POA: Diagnosis not present

## 2017-07-17 DIAGNOSIS — M12811 Other specific arthropathies, not elsewhere classified, right shoulder: Secondary | ICD-10-CM | POA: Diagnosis not present

## 2017-07-18 DIAGNOSIS — E1151 Type 2 diabetes mellitus with diabetic peripheral angiopathy without gangrene: Secondary | ICD-10-CM | POA: Diagnosis not present

## 2017-07-18 DIAGNOSIS — E1142 Type 2 diabetes mellitus with diabetic polyneuropathy: Secondary | ICD-10-CM | POA: Diagnosis not present

## 2017-07-18 DIAGNOSIS — G8191 Hemiplegia, unspecified affecting right dominant side: Secondary | ICD-10-CM | POA: Diagnosis not present

## 2017-07-18 DIAGNOSIS — M5136 Other intervertebral disc degeneration, lumbar region: Secondary | ICD-10-CM | POA: Diagnosis not present

## 2017-07-18 DIAGNOSIS — M4802 Spinal stenosis, cervical region: Secondary | ICD-10-CM | POA: Diagnosis not present

## 2017-07-18 DIAGNOSIS — I2510 Atherosclerotic heart disease of native coronary artery without angina pectoris: Secondary | ICD-10-CM | POA: Diagnosis not present

## 2017-07-19 DIAGNOSIS — I2510 Atherosclerotic heart disease of native coronary artery without angina pectoris: Secondary | ICD-10-CM | POA: Diagnosis not present

## 2017-07-19 DIAGNOSIS — E1151 Type 2 diabetes mellitus with diabetic peripheral angiopathy without gangrene: Secondary | ICD-10-CM | POA: Diagnosis not present

## 2017-07-19 DIAGNOSIS — E1142 Type 2 diabetes mellitus with diabetic polyneuropathy: Secondary | ICD-10-CM | POA: Diagnosis not present

## 2017-07-19 DIAGNOSIS — G8191 Hemiplegia, unspecified affecting right dominant side: Secondary | ICD-10-CM | POA: Diagnosis not present

## 2017-07-19 DIAGNOSIS — M5136 Other intervertebral disc degeneration, lumbar region: Secondary | ICD-10-CM | POA: Diagnosis not present

## 2017-07-19 DIAGNOSIS — M4802 Spinal stenosis, cervical region: Secondary | ICD-10-CM | POA: Diagnosis not present

## 2017-07-23 DIAGNOSIS — E1151 Type 2 diabetes mellitus with diabetic peripheral angiopathy without gangrene: Secondary | ICD-10-CM | POA: Diagnosis not present

## 2017-07-23 DIAGNOSIS — G8191 Hemiplegia, unspecified affecting right dominant side: Secondary | ICD-10-CM | POA: Diagnosis not present

## 2017-07-23 DIAGNOSIS — E1142 Type 2 diabetes mellitus with diabetic polyneuropathy: Secondary | ICD-10-CM | POA: Diagnosis not present

## 2017-07-23 DIAGNOSIS — I2510 Atherosclerotic heart disease of native coronary artery without angina pectoris: Secondary | ICD-10-CM | POA: Diagnosis not present

## 2017-07-23 DIAGNOSIS — M5136 Other intervertebral disc degeneration, lumbar region: Secondary | ICD-10-CM | POA: Diagnosis not present

## 2017-07-23 DIAGNOSIS — M4802 Spinal stenosis, cervical region: Secondary | ICD-10-CM | POA: Diagnosis not present

## 2017-07-24 DIAGNOSIS — M4802 Spinal stenosis, cervical region: Secondary | ICD-10-CM | POA: Diagnosis not present

## 2017-07-24 DIAGNOSIS — I2510 Atherosclerotic heart disease of native coronary artery without angina pectoris: Secondary | ICD-10-CM | POA: Diagnosis not present

## 2017-07-24 DIAGNOSIS — E1142 Type 2 diabetes mellitus with diabetic polyneuropathy: Secondary | ICD-10-CM | POA: Diagnosis not present

## 2017-07-24 DIAGNOSIS — E1151 Type 2 diabetes mellitus with diabetic peripheral angiopathy without gangrene: Secondary | ICD-10-CM | POA: Diagnosis not present

## 2017-07-24 DIAGNOSIS — M5136 Other intervertebral disc degeneration, lumbar region: Secondary | ICD-10-CM | POA: Diagnosis not present

## 2017-07-24 DIAGNOSIS — G8191 Hemiplegia, unspecified affecting right dominant side: Secondary | ICD-10-CM | POA: Diagnosis not present

## 2017-07-25 DIAGNOSIS — G8191 Hemiplegia, unspecified affecting right dominant side: Secondary | ICD-10-CM | POA: Diagnosis not present

## 2017-07-25 DIAGNOSIS — M5136 Other intervertebral disc degeneration, lumbar region: Secondary | ICD-10-CM | POA: Diagnosis not present

## 2017-07-25 DIAGNOSIS — I2510 Atherosclerotic heart disease of native coronary artery without angina pectoris: Secondary | ICD-10-CM | POA: Diagnosis not present

## 2017-07-25 DIAGNOSIS — E1151 Type 2 diabetes mellitus with diabetic peripheral angiopathy without gangrene: Secondary | ICD-10-CM | POA: Diagnosis not present

## 2017-07-25 DIAGNOSIS — E1142 Type 2 diabetes mellitus with diabetic polyneuropathy: Secondary | ICD-10-CM | POA: Diagnosis not present

## 2017-07-25 DIAGNOSIS — M4802 Spinal stenosis, cervical region: Secondary | ICD-10-CM | POA: Diagnosis not present

## 2017-07-29 DIAGNOSIS — M5136 Other intervertebral disc degeneration, lumbar region: Secondary | ICD-10-CM | POA: Diagnosis not present

## 2017-07-29 DIAGNOSIS — E1142 Type 2 diabetes mellitus with diabetic polyneuropathy: Secondary | ICD-10-CM | POA: Diagnosis not present

## 2017-07-29 DIAGNOSIS — M4802 Spinal stenosis, cervical region: Secondary | ICD-10-CM | POA: Diagnosis not present

## 2017-07-29 DIAGNOSIS — I2510 Atherosclerotic heart disease of native coronary artery without angina pectoris: Secondary | ICD-10-CM | POA: Diagnosis not present

## 2017-07-29 DIAGNOSIS — E1151 Type 2 diabetes mellitus with diabetic peripheral angiopathy without gangrene: Secondary | ICD-10-CM | POA: Diagnosis not present

## 2017-07-29 DIAGNOSIS — G8191 Hemiplegia, unspecified affecting right dominant side: Secondary | ICD-10-CM | POA: Diagnosis not present

## 2017-08-01 DIAGNOSIS — I2510 Atherosclerotic heart disease of native coronary artery without angina pectoris: Secondary | ICD-10-CM | POA: Diagnosis not present

## 2017-08-01 DIAGNOSIS — E1142 Type 2 diabetes mellitus with diabetic polyneuropathy: Secondary | ICD-10-CM | POA: Diagnosis not present

## 2017-08-01 DIAGNOSIS — E1151 Type 2 diabetes mellitus with diabetic peripheral angiopathy without gangrene: Secondary | ICD-10-CM | POA: Diagnosis not present

## 2017-08-01 DIAGNOSIS — M5136 Other intervertebral disc degeneration, lumbar region: Secondary | ICD-10-CM | POA: Diagnosis not present

## 2017-08-01 DIAGNOSIS — M4802 Spinal stenosis, cervical region: Secondary | ICD-10-CM | POA: Diagnosis not present

## 2017-08-01 DIAGNOSIS — G8191 Hemiplegia, unspecified affecting right dominant side: Secondary | ICD-10-CM | POA: Diagnosis not present

## 2017-08-12 DIAGNOSIS — G8191 Hemiplegia, unspecified affecting right dominant side: Secondary | ICD-10-CM | POA: Diagnosis not present

## 2017-08-12 DIAGNOSIS — E1151 Type 2 diabetes mellitus with diabetic peripheral angiopathy without gangrene: Secondary | ICD-10-CM | POA: Diagnosis not present

## 2017-08-12 DIAGNOSIS — I2510 Atherosclerotic heart disease of native coronary artery without angina pectoris: Secondary | ICD-10-CM | POA: Diagnosis not present

## 2017-08-12 DIAGNOSIS — M5136 Other intervertebral disc degeneration, lumbar region: Secondary | ICD-10-CM | POA: Diagnosis not present

## 2017-08-12 DIAGNOSIS — M4802 Spinal stenosis, cervical region: Secondary | ICD-10-CM | POA: Diagnosis not present

## 2017-08-12 DIAGNOSIS — E1142 Type 2 diabetes mellitus with diabetic polyneuropathy: Secondary | ICD-10-CM | POA: Diagnosis not present

## 2017-08-14 DIAGNOSIS — G8191 Hemiplegia, unspecified affecting right dominant side: Secondary | ICD-10-CM | POA: Diagnosis not present

## 2017-08-14 DIAGNOSIS — E1142 Type 2 diabetes mellitus with diabetic polyneuropathy: Secondary | ICD-10-CM | POA: Diagnosis not present

## 2017-08-14 DIAGNOSIS — M19019 Primary osteoarthritis, unspecified shoulder: Secondary | ICD-10-CM | POA: Diagnosis not present

## 2017-08-14 DIAGNOSIS — E1151 Type 2 diabetes mellitus with diabetic peripheral angiopathy without gangrene: Secondary | ICD-10-CM | POA: Diagnosis not present

## 2017-08-14 DIAGNOSIS — M4802 Spinal stenosis, cervical region: Secondary | ICD-10-CM | POA: Diagnosis not present

## 2017-08-14 DIAGNOSIS — R5381 Other malaise: Secondary | ICD-10-CM | POA: Diagnosis not present

## 2017-08-14 DIAGNOSIS — L539 Erythematous condition, unspecified: Secondary | ICD-10-CM | POA: Diagnosis not present

## 2017-08-14 DIAGNOSIS — E118 Type 2 diabetes mellitus with unspecified complications: Secondary | ICD-10-CM | POA: Diagnosis not present

## 2017-08-14 DIAGNOSIS — E785 Hyperlipidemia, unspecified: Secondary | ICD-10-CM | POA: Diagnosis not present

## 2017-08-14 DIAGNOSIS — R600 Localized edema: Secondary | ICD-10-CM | POA: Diagnosis not present

## 2017-08-14 DIAGNOSIS — Z794 Long term (current) use of insulin: Secondary | ICD-10-CM | POA: Diagnosis not present

## 2017-08-14 DIAGNOSIS — I10 Essential (primary) hypertension: Secondary | ICD-10-CM | POA: Diagnosis not present

## 2017-08-14 DIAGNOSIS — G3184 Mild cognitive impairment, so stated: Secondary | ICD-10-CM | POA: Diagnosis not present

## 2017-08-14 DIAGNOSIS — G4733 Obstructive sleep apnea (adult) (pediatric): Secondary | ICD-10-CM | POA: Diagnosis not present

## 2017-08-14 DIAGNOSIS — Z8673 Personal history of transient ischemic attack (TIA), and cerebral infarction without residual deficits: Secondary | ICD-10-CM | POA: Diagnosis not present

## 2017-08-14 DIAGNOSIS — E538 Deficiency of other specified B group vitamins: Secondary | ICD-10-CM | POA: Diagnosis not present

## 2017-08-14 DIAGNOSIS — M169 Osteoarthritis of hip, unspecified: Secondary | ICD-10-CM | POA: Diagnosis not present

## 2017-08-14 DIAGNOSIS — M5136 Other intervertebral disc degeneration, lumbar region: Secondary | ICD-10-CM | POA: Diagnosis not present

## 2017-08-14 DIAGNOSIS — I2510 Atherosclerotic heart disease of native coronary artery without angina pectoris: Secondary | ICD-10-CM | POA: Diagnosis not present

## 2017-08-14 DIAGNOSIS — Z79899 Other long term (current) drug therapy: Secondary | ICD-10-CM | POA: Diagnosis not present

## 2017-08-14 DIAGNOSIS — Z9189 Other specified personal risk factors, not elsewhere classified: Secondary | ICD-10-CM | POA: Diagnosis not present

## 2017-08-14 DIAGNOSIS — Z9989 Dependence on other enabling machines and devices: Secondary | ICD-10-CM | POA: Diagnosis not present

## 2017-08-14 DIAGNOSIS — M7989 Other specified soft tissue disorders: Secondary | ICD-10-CM | POA: Diagnosis not present

## 2017-08-15 DIAGNOSIS — G8191 Hemiplegia, unspecified affecting right dominant side: Secondary | ICD-10-CM | POA: Diagnosis not present

## 2017-08-15 DIAGNOSIS — E1142 Type 2 diabetes mellitus with diabetic polyneuropathy: Secondary | ICD-10-CM | POA: Diagnosis not present

## 2017-08-15 DIAGNOSIS — M4802 Spinal stenosis, cervical region: Secondary | ICD-10-CM | POA: Diagnosis not present

## 2017-08-15 DIAGNOSIS — M5136 Other intervertebral disc degeneration, lumbar region: Secondary | ICD-10-CM | POA: Diagnosis not present

## 2017-08-15 DIAGNOSIS — I2510 Atherosclerotic heart disease of native coronary artery without angina pectoris: Secondary | ICD-10-CM | POA: Diagnosis not present

## 2017-08-15 DIAGNOSIS — E1151 Type 2 diabetes mellitus with diabetic peripheral angiopathy without gangrene: Secondary | ICD-10-CM | POA: Diagnosis not present

## 2017-08-16 DIAGNOSIS — I2510 Atherosclerotic heart disease of native coronary artery without angina pectoris: Secondary | ICD-10-CM | POA: Diagnosis not present

## 2017-08-16 DIAGNOSIS — E1142 Type 2 diabetes mellitus with diabetic polyneuropathy: Secondary | ICD-10-CM | POA: Diagnosis not present

## 2017-08-16 DIAGNOSIS — G8191 Hemiplegia, unspecified affecting right dominant side: Secondary | ICD-10-CM | POA: Diagnosis not present

## 2017-08-16 DIAGNOSIS — E1151 Type 2 diabetes mellitus with diabetic peripheral angiopathy without gangrene: Secondary | ICD-10-CM | POA: Diagnosis not present

## 2017-08-16 DIAGNOSIS — M4802 Spinal stenosis, cervical region: Secondary | ICD-10-CM | POA: Diagnosis not present

## 2017-08-16 DIAGNOSIS — M5136 Other intervertebral disc degeneration, lumbar region: Secondary | ICD-10-CM | POA: Diagnosis not present

## 2017-08-19 DIAGNOSIS — M4802 Spinal stenosis, cervical region: Secondary | ICD-10-CM | POA: Diagnosis not present

## 2017-08-19 DIAGNOSIS — I2510 Atherosclerotic heart disease of native coronary artery without angina pectoris: Secondary | ICD-10-CM | POA: Diagnosis not present

## 2017-08-19 DIAGNOSIS — M5136 Other intervertebral disc degeneration, lumbar region: Secondary | ICD-10-CM | POA: Diagnosis not present

## 2017-08-19 DIAGNOSIS — E1151 Type 2 diabetes mellitus with diabetic peripheral angiopathy without gangrene: Secondary | ICD-10-CM | POA: Diagnosis not present

## 2017-08-19 DIAGNOSIS — G8191 Hemiplegia, unspecified affecting right dominant side: Secondary | ICD-10-CM | POA: Diagnosis not present

## 2017-08-19 DIAGNOSIS — E1142 Type 2 diabetes mellitus with diabetic polyneuropathy: Secondary | ICD-10-CM | POA: Diagnosis not present

## 2017-08-20 DIAGNOSIS — G8191 Hemiplegia, unspecified affecting right dominant side: Secondary | ICD-10-CM | POA: Diagnosis not present

## 2017-08-20 DIAGNOSIS — E1151 Type 2 diabetes mellitus with diabetic peripheral angiopathy without gangrene: Secondary | ICD-10-CM | POA: Diagnosis not present

## 2017-08-20 DIAGNOSIS — E1142 Type 2 diabetes mellitus with diabetic polyneuropathy: Secondary | ICD-10-CM | POA: Diagnosis not present

## 2017-08-20 DIAGNOSIS — M5136 Other intervertebral disc degeneration, lumbar region: Secondary | ICD-10-CM | POA: Diagnosis not present

## 2017-08-20 DIAGNOSIS — M4802 Spinal stenosis, cervical region: Secondary | ICD-10-CM | POA: Diagnosis not present

## 2017-08-20 DIAGNOSIS — I2510 Atherosclerotic heart disease of native coronary artery without angina pectoris: Secondary | ICD-10-CM | POA: Diagnosis not present

## 2017-08-21 DIAGNOSIS — M4802 Spinal stenosis, cervical region: Secondary | ICD-10-CM | POA: Diagnosis not present

## 2017-08-21 DIAGNOSIS — E1151 Type 2 diabetes mellitus with diabetic peripheral angiopathy without gangrene: Secondary | ICD-10-CM | POA: Diagnosis not present

## 2017-08-21 DIAGNOSIS — M5136 Other intervertebral disc degeneration, lumbar region: Secondary | ICD-10-CM | POA: Diagnosis not present

## 2017-08-21 DIAGNOSIS — E1142 Type 2 diabetes mellitus with diabetic polyneuropathy: Secondary | ICD-10-CM | POA: Diagnosis not present

## 2017-08-21 DIAGNOSIS — I2510 Atherosclerotic heart disease of native coronary artery without angina pectoris: Secondary | ICD-10-CM | POA: Diagnosis not present

## 2017-08-21 DIAGNOSIS — G8191 Hemiplegia, unspecified affecting right dominant side: Secondary | ICD-10-CM | POA: Diagnosis not present

## 2017-08-22 DIAGNOSIS — M5136 Other intervertebral disc degeneration, lumbar region: Secondary | ICD-10-CM | POA: Diagnosis not present

## 2017-08-22 DIAGNOSIS — E1142 Type 2 diabetes mellitus with diabetic polyneuropathy: Secondary | ICD-10-CM | POA: Diagnosis not present

## 2017-08-22 DIAGNOSIS — G8191 Hemiplegia, unspecified affecting right dominant side: Secondary | ICD-10-CM | POA: Diagnosis not present

## 2017-08-22 DIAGNOSIS — M4802 Spinal stenosis, cervical region: Secondary | ICD-10-CM | POA: Diagnosis not present

## 2017-08-22 DIAGNOSIS — E1151 Type 2 diabetes mellitus with diabetic peripheral angiopathy without gangrene: Secondary | ICD-10-CM | POA: Diagnosis not present

## 2017-08-22 DIAGNOSIS — I2510 Atherosclerotic heart disease of native coronary artery without angina pectoris: Secondary | ICD-10-CM | POA: Diagnosis not present

## 2017-08-26 DIAGNOSIS — M4802 Spinal stenosis, cervical region: Secondary | ICD-10-CM | POA: Diagnosis not present

## 2017-08-26 DIAGNOSIS — E1142 Type 2 diabetes mellitus with diabetic polyneuropathy: Secondary | ICD-10-CM | POA: Diagnosis not present

## 2017-08-26 DIAGNOSIS — G8191 Hemiplegia, unspecified affecting right dominant side: Secondary | ICD-10-CM | POA: Diagnosis not present

## 2017-08-26 DIAGNOSIS — E1151 Type 2 diabetes mellitus with diabetic peripheral angiopathy without gangrene: Secondary | ICD-10-CM | POA: Diagnosis not present

## 2017-08-26 DIAGNOSIS — I2510 Atherosclerotic heart disease of native coronary artery without angina pectoris: Secondary | ICD-10-CM | POA: Diagnosis not present

## 2017-08-26 DIAGNOSIS — M5136 Other intervertebral disc degeneration, lumbar region: Secondary | ICD-10-CM | POA: Diagnosis not present

## 2017-08-28 DIAGNOSIS — E1151 Type 2 diabetes mellitus with diabetic peripheral angiopathy without gangrene: Secondary | ICD-10-CM | POA: Diagnosis not present

## 2017-08-28 DIAGNOSIS — M5136 Other intervertebral disc degeneration, lumbar region: Secondary | ICD-10-CM | POA: Diagnosis not present

## 2017-08-28 DIAGNOSIS — E1142 Type 2 diabetes mellitus with diabetic polyneuropathy: Secondary | ICD-10-CM | POA: Diagnosis not present

## 2017-08-28 DIAGNOSIS — G8191 Hemiplegia, unspecified affecting right dominant side: Secondary | ICD-10-CM | POA: Diagnosis not present

## 2017-08-28 DIAGNOSIS — I2510 Atherosclerotic heart disease of native coronary artery without angina pectoris: Secondary | ICD-10-CM | POA: Diagnosis not present

## 2017-08-28 DIAGNOSIS — M4802 Spinal stenosis, cervical region: Secondary | ICD-10-CM | POA: Diagnosis not present

## 2017-08-29 DIAGNOSIS — M5136 Other intervertebral disc degeneration, lumbar region: Secondary | ICD-10-CM | POA: Diagnosis not present

## 2017-08-29 DIAGNOSIS — E1151 Type 2 diabetes mellitus with diabetic peripheral angiopathy without gangrene: Secondary | ICD-10-CM | POA: Diagnosis not present

## 2017-08-29 DIAGNOSIS — M4802 Spinal stenosis, cervical region: Secondary | ICD-10-CM | POA: Diagnosis not present

## 2017-08-29 DIAGNOSIS — E1142 Type 2 diabetes mellitus with diabetic polyneuropathy: Secondary | ICD-10-CM | POA: Diagnosis not present

## 2017-08-29 DIAGNOSIS — G8191 Hemiplegia, unspecified affecting right dominant side: Secondary | ICD-10-CM | POA: Diagnosis not present

## 2017-08-29 DIAGNOSIS — I2510 Atherosclerotic heart disease of native coronary artery without angina pectoris: Secondary | ICD-10-CM | POA: Diagnosis not present

## 2017-09-02 DIAGNOSIS — M5136 Other intervertebral disc degeneration, lumbar region: Secondary | ICD-10-CM | POA: Diagnosis not present

## 2017-09-02 DIAGNOSIS — G8191 Hemiplegia, unspecified affecting right dominant side: Secondary | ICD-10-CM | POA: Diagnosis not present

## 2017-09-02 DIAGNOSIS — I2510 Atherosclerotic heart disease of native coronary artery without angina pectoris: Secondary | ICD-10-CM | POA: Diagnosis not present

## 2017-09-02 DIAGNOSIS — M4802 Spinal stenosis, cervical region: Secondary | ICD-10-CM | POA: Diagnosis not present

## 2017-09-02 DIAGNOSIS — E1142 Type 2 diabetes mellitus with diabetic polyneuropathy: Secondary | ICD-10-CM | POA: Diagnosis not present

## 2017-09-02 DIAGNOSIS — E1151 Type 2 diabetes mellitus with diabetic peripheral angiopathy without gangrene: Secondary | ICD-10-CM | POA: Diagnosis not present

## 2017-09-03 DIAGNOSIS — Z9181 History of falling: Secondary | ICD-10-CM | POA: Diagnosis not present

## 2017-09-03 DIAGNOSIS — G4733 Obstructive sleep apnea (adult) (pediatric): Secondary | ICD-10-CM | POA: Diagnosis not present

## 2017-09-03 DIAGNOSIS — Z7982 Long term (current) use of aspirin: Secondary | ICD-10-CM | POA: Diagnosis not present

## 2017-09-03 DIAGNOSIS — F3341 Major depressive disorder, recurrent, in partial remission: Secondary | ICD-10-CM | POA: Diagnosis not present

## 2017-09-03 DIAGNOSIS — I2510 Atherosclerotic heart disease of native coronary artery without angina pectoris: Secondary | ICD-10-CM | POA: Diagnosis not present

## 2017-09-03 DIAGNOSIS — R600 Localized edema: Secondary | ICD-10-CM | POA: Diagnosis not present

## 2017-09-03 DIAGNOSIS — M4802 Spinal stenosis, cervical region: Secondary | ICD-10-CM | POA: Diagnosis not present

## 2017-09-03 DIAGNOSIS — D509 Iron deficiency anemia, unspecified: Secondary | ICD-10-CM | POA: Diagnosis not present

## 2017-09-03 DIAGNOSIS — E1151 Type 2 diabetes mellitus with diabetic peripheral angiopathy without gangrene: Secondary | ICD-10-CM | POA: Diagnosis not present

## 2017-09-03 DIAGNOSIS — G8191 Hemiplegia, unspecified affecting right dominant side: Secondary | ICD-10-CM | POA: Diagnosis not present

## 2017-09-03 DIAGNOSIS — Z794 Long term (current) use of insulin: Secondary | ICD-10-CM | POA: Diagnosis not present

## 2017-09-03 DIAGNOSIS — F0390 Unspecified dementia without behavioral disturbance: Secondary | ICD-10-CM | POA: Diagnosis not present

## 2017-09-03 DIAGNOSIS — Z8673 Personal history of transient ischemic attack (TIA), and cerebral infarction without residual deficits: Secondary | ICD-10-CM | POA: Diagnosis not present

## 2017-09-03 DIAGNOSIS — Z7902 Long term (current) use of antithrombotics/antiplatelets: Secondary | ICD-10-CM | POA: Diagnosis not present

## 2017-09-03 DIAGNOSIS — Z87891 Personal history of nicotine dependence: Secondary | ICD-10-CM | POA: Diagnosis not present

## 2017-09-03 DIAGNOSIS — E1142 Type 2 diabetes mellitus with diabetic polyneuropathy: Secondary | ICD-10-CM | POA: Diagnosis not present

## 2017-09-03 DIAGNOSIS — M5136 Other intervertebral disc degeneration, lumbar region: Secondary | ICD-10-CM | POA: Diagnosis not present

## 2017-09-04 DIAGNOSIS — M4802 Spinal stenosis, cervical region: Secondary | ICD-10-CM | POA: Diagnosis not present

## 2017-09-04 DIAGNOSIS — E1151 Type 2 diabetes mellitus with diabetic peripheral angiopathy without gangrene: Secondary | ICD-10-CM | POA: Diagnosis not present

## 2017-09-04 DIAGNOSIS — E1142 Type 2 diabetes mellitus with diabetic polyneuropathy: Secondary | ICD-10-CM | POA: Diagnosis not present

## 2017-09-04 DIAGNOSIS — R600 Localized edema: Secondary | ICD-10-CM | POA: Diagnosis not present

## 2017-09-04 DIAGNOSIS — I2510 Atherosclerotic heart disease of native coronary artery without angina pectoris: Secondary | ICD-10-CM | POA: Diagnosis not present

## 2017-09-04 DIAGNOSIS — G8191 Hemiplegia, unspecified affecting right dominant side: Secondary | ICD-10-CM | POA: Diagnosis not present

## 2017-09-06 DIAGNOSIS — G8191 Hemiplegia, unspecified affecting right dominant side: Secondary | ICD-10-CM | POA: Diagnosis not present

## 2017-09-06 DIAGNOSIS — R600 Localized edema: Secondary | ICD-10-CM | POA: Diagnosis not present

## 2017-09-06 DIAGNOSIS — E538 Deficiency of other specified B group vitamins: Secondary | ICD-10-CM | POA: Diagnosis not present

## 2017-09-06 DIAGNOSIS — I10 Essential (primary) hypertension: Secondary | ICD-10-CM | POA: Diagnosis not present

## 2017-09-06 DIAGNOSIS — Z794 Long term (current) use of insulin: Secondary | ICD-10-CM | POA: Diagnosis not present

## 2017-09-06 DIAGNOSIS — I2510 Atherosclerotic heart disease of native coronary artery without angina pectoris: Secondary | ICD-10-CM | POA: Diagnosis not present

## 2017-09-06 DIAGNOSIS — E1151 Type 2 diabetes mellitus with diabetic peripheral angiopathy without gangrene: Secondary | ICD-10-CM | POA: Diagnosis not present

## 2017-09-06 DIAGNOSIS — M4802 Spinal stenosis, cervical region: Secondary | ICD-10-CM | POA: Diagnosis not present

## 2017-09-06 DIAGNOSIS — E1142 Type 2 diabetes mellitus with diabetic polyneuropathy: Secondary | ICD-10-CM | POA: Diagnosis not present

## 2017-09-09 DIAGNOSIS — E1142 Type 2 diabetes mellitus with diabetic polyneuropathy: Secondary | ICD-10-CM | POA: Diagnosis not present

## 2017-09-09 DIAGNOSIS — I2510 Atherosclerotic heart disease of native coronary artery without angina pectoris: Secondary | ICD-10-CM | POA: Diagnosis not present

## 2017-09-09 DIAGNOSIS — E785 Hyperlipidemia, unspecified: Secondary | ICD-10-CM | POA: Diagnosis not present

## 2017-09-09 DIAGNOSIS — Z794 Long term (current) use of insulin: Secondary | ICD-10-CM | POA: Diagnosis not present

## 2017-09-09 DIAGNOSIS — M4802 Spinal stenosis, cervical region: Secondary | ICD-10-CM | POA: Diagnosis not present

## 2017-09-09 DIAGNOSIS — R0789 Other chest pain: Secondary | ICD-10-CM | POA: Diagnosis not present

## 2017-09-09 DIAGNOSIS — R079 Chest pain, unspecified: Secondary | ICD-10-CM | POA: Diagnosis not present

## 2017-09-09 DIAGNOSIS — R600 Localized edema: Secondary | ICD-10-CM | POA: Diagnosis not present

## 2017-09-09 DIAGNOSIS — J189 Pneumonia, unspecified organism: Secondary | ICD-10-CM | POA: Diagnosis not present

## 2017-09-09 DIAGNOSIS — M1990 Unspecified osteoarthritis, unspecified site: Secondary | ICD-10-CM | POA: Diagnosis not present

## 2017-09-09 DIAGNOSIS — R0602 Shortness of breath: Secondary | ICD-10-CM | POA: Diagnosis not present

## 2017-09-09 DIAGNOSIS — E1151 Type 2 diabetes mellitus with diabetic peripheral angiopathy without gangrene: Secondary | ICD-10-CM | POA: Diagnosis not present

## 2017-09-09 DIAGNOSIS — G8191 Hemiplegia, unspecified affecting right dominant side: Secondary | ICD-10-CM | POA: Diagnosis not present

## 2017-09-09 DIAGNOSIS — R61 Generalized hyperhidrosis: Secondary | ICD-10-CM | POA: Diagnosis not present

## 2017-09-09 DIAGNOSIS — Z955 Presence of coronary angioplasty implant and graft: Secondary | ICD-10-CM | POA: Diagnosis not present

## 2017-09-10 DIAGNOSIS — I4589 Other specified conduction disorders: Secondary | ICD-10-CM | POA: Diagnosis not present

## 2017-09-10 DIAGNOSIS — E119 Type 2 diabetes mellitus without complications: Secondary | ICD-10-CM | POA: Diagnosis not present

## 2017-09-10 DIAGNOSIS — E785 Hyperlipidemia, unspecified: Secondary | ICD-10-CM | POA: Diagnosis present

## 2017-09-10 DIAGNOSIS — K219 Gastro-esophageal reflux disease without esophagitis: Secondary | ICD-10-CM | POA: Diagnosis present

## 2017-09-10 DIAGNOSIS — J189 Pneumonia, unspecified organism: Secondary | ICD-10-CM | POA: Diagnosis not present

## 2017-09-10 DIAGNOSIS — E1142 Type 2 diabetes mellitus with diabetic polyneuropathy: Secondary | ICD-10-CM | POA: Diagnosis present

## 2017-09-10 DIAGNOSIS — Z7982 Long term (current) use of aspirin: Secondary | ICD-10-CM | POA: Diagnosis not present

## 2017-09-10 DIAGNOSIS — E041 Nontoxic single thyroid nodule: Secondary | ICD-10-CM | POA: Diagnosis not present

## 2017-09-10 DIAGNOSIS — R5381 Other malaise: Secondary | ICD-10-CM | POA: Diagnosis present

## 2017-09-10 DIAGNOSIS — Z7902 Long term (current) use of antithrombotics/antiplatelets: Secondary | ICD-10-CM | POA: Diagnosis not present

## 2017-09-10 DIAGNOSIS — M1990 Unspecified osteoarthritis, unspecified site: Secondary | ICD-10-CM | POA: Diagnosis present

## 2017-09-10 DIAGNOSIS — I10 Essential (primary) hypertension: Secondary | ICD-10-CM | POA: Diagnosis present

## 2017-09-10 DIAGNOSIS — Z9181 History of falling: Secondary | ICD-10-CM | POA: Diagnosis not present

## 2017-09-10 DIAGNOSIS — F329 Major depressive disorder, single episode, unspecified: Secondary | ICD-10-CM | POA: Diagnosis present

## 2017-09-10 DIAGNOSIS — Z955 Presence of coronary angioplasty implant and graft: Secondary | ICD-10-CM | POA: Diagnosis not present

## 2017-09-10 DIAGNOSIS — Z87891 Personal history of nicotine dependence: Secondary | ICD-10-CM | POA: Diagnosis not present

## 2017-09-10 DIAGNOSIS — R918 Other nonspecific abnormal finding of lung field: Secondary | ICD-10-CM | POA: Diagnosis not present

## 2017-09-10 DIAGNOSIS — Z794 Long term (current) use of insulin: Secondary | ICD-10-CM | POA: Diagnosis not present

## 2017-09-10 DIAGNOSIS — R079 Chest pain, unspecified: Secondary | ICD-10-CM | POA: Diagnosis not present

## 2017-09-10 DIAGNOSIS — Z7409 Other reduced mobility: Secondary | ICD-10-CM | POA: Diagnosis present

## 2017-09-10 DIAGNOSIS — G3184 Mild cognitive impairment, so stated: Secondary | ICD-10-CM | POA: Diagnosis present

## 2017-09-10 DIAGNOSIS — G4733 Obstructive sleep apnea (adult) (pediatric): Secondary | ICD-10-CM | POA: Diagnosis present

## 2017-09-10 DIAGNOSIS — Z9049 Acquired absence of other specified parts of digestive tract: Secondary | ICD-10-CM | POA: Diagnosis not present

## 2017-09-10 DIAGNOSIS — R3981 Functional urinary incontinence: Secondary | ICD-10-CM | POA: Diagnosis present

## 2017-09-10 DIAGNOSIS — Z8673 Personal history of transient ischemic attack (TIA), and cerebral infarction without residual deficits: Secondary | ICD-10-CM | POA: Diagnosis not present

## 2017-09-10 DIAGNOSIS — I2510 Atherosclerotic heart disease of native coronary artery without angina pectoris: Secondary | ICD-10-CM | POA: Diagnosis not present

## 2017-09-10 DIAGNOSIS — I252 Old myocardial infarction: Secondary | ICD-10-CM | POA: Diagnosis not present

## 2017-09-11 DIAGNOSIS — F3341 Major depressive disorder, recurrent, in partial remission: Secondary | ICD-10-CM | POA: Diagnosis not present

## 2017-09-11 DIAGNOSIS — Z794 Long term (current) use of insulin: Secondary | ICD-10-CM | POA: Diagnosis not present

## 2017-09-11 DIAGNOSIS — J181 Lobar pneumonia, unspecified organism: Secondary | ICD-10-CM | POA: Diagnosis not present

## 2017-09-11 DIAGNOSIS — E1142 Type 2 diabetes mellitus with diabetic polyneuropathy: Secondary | ICD-10-CM | POA: Diagnosis not present

## 2017-09-13 DIAGNOSIS — G8191 Hemiplegia, unspecified affecting right dominant side: Secondary | ICD-10-CM | POA: Diagnosis not present

## 2017-09-13 DIAGNOSIS — M4802 Spinal stenosis, cervical region: Secondary | ICD-10-CM | POA: Diagnosis not present

## 2017-09-13 DIAGNOSIS — E1151 Type 2 diabetes mellitus with diabetic peripheral angiopathy without gangrene: Secondary | ICD-10-CM | POA: Diagnosis not present

## 2017-09-13 DIAGNOSIS — R600 Localized edema: Secondary | ICD-10-CM | POA: Diagnosis not present

## 2017-09-13 DIAGNOSIS — E1142 Type 2 diabetes mellitus with diabetic polyneuropathy: Secondary | ICD-10-CM | POA: Diagnosis not present

## 2017-09-13 DIAGNOSIS — I2510 Atherosclerotic heart disease of native coronary artery without angina pectoris: Secondary | ICD-10-CM | POA: Diagnosis not present

## 2017-09-16 DIAGNOSIS — M4802 Spinal stenosis, cervical region: Secondary | ICD-10-CM | POA: Diagnosis not present

## 2017-09-16 DIAGNOSIS — E1151 Type 2 diabetes mellitus with diabetic peripheral angiopathy without gangrene: Secondary | ICD-10-CM | POA: Diagnosis not present

## 2017-09-16 DIAGNOSIS — G8191 Hemiplegia, unspecified affecting right dominant side: Secondary | ICD-10-CM | POA: Diagnosis not present

## 2017-09-16 DIAGNOSIS — I2510 Atherosclerotic heart disease of native coronary artery without angina pectoris: Secondary | ICD-10-CM | POA: Diagnosis not present

## 2017-09-16 DIAGNOSIS — E1142 Type 2 diabetes mellitus with diabetic polyneuropathy: Secondary | ICD-10-CM | POA: Diagnosis not present

## 2017-09-16 DIAGNOSIS — R600 Localized edema: Secondary | ICD-10-CM | POA: Diagnosis not present

## 2017-09-19 DIAGNOSIS — M4802 Spinal stenosis, cervical region: Secondary | ICD-10-CM | POA: Diagnosis not present

## 2017-09-19 DIAGNOSIS — G8191 Hemiplegia, unspecified affecting right dominant side: Secondary | ICD-10-CM | POA: Diagnosis not present

## 2017-09-19 DIAGNOSIS — E1151 Type 2 diabetes mellitus with diabetic peripheral angiopathy without gangrene: Secondary | ICD-10-CM | POA: Diagnosis not present

## 2017-09-19 DIAGNOSIS — R600 Localized edema: Secondary | ICD-10-CM | POA: Diagnosis not present

## 2017-09-19 DIAGNOSIS — I2510 Atherosclerotic heart disease of native coronary artery without angina pectoris: Secondary | ICD-10-CM | POA: Diagnosis not present

## 2017-09-19 DIAGNOSIS — E1142 Type 2 diabetes mellitus with diabetic polyneuropathy: Secondary | ICD-10-CM | POA: Diagnosis not present

## 2017-09-20 DIAGNOSIS — M4802 Spinal stenosis, cervical region: Secondary | ICD-10-CM | POA: Diagnosis not present

## 2017-09-20 DIAGNOSIS — G8191 Hemiplegia, unspecified affecting right dominant side: Secondary | ICD-10-CM | POA: Diagnosis not present

## 2017-09-20 DIAGNOSIS — R600 Localized edema: Secondary | ICD-10-CM | POA: Diagnosis not present

## 2017-09-20 DIAGNOSIS — E1142 Type 2 diabetes mellitus with diabetic polyneuropathy: Secondary | ICD-10-CM | POA: Diagnosis not present

## 2017-09-20 DIAGNOSIS — E1151 Type 2 diabetes mellitus with diabetic peripheral angiopathy without gangrene: Secondary | ICD-10-CM | POA: Diagnosis not present

## 2017-09-20 DIAGNOSIS — I2510 Atherosclerotic heart disease of native coronary artery without angina pectoris: Secondary | ICD-10-CM | POA: Diagnosis not present

## 2017-09-23 DIAGNOSIS — R600 Localized edema: Secondary | ICD-10-CM | POA: Diagnosis not present

## 2017-09-23 DIAGNOSIS — I2510 Atherosclerotic heart disease of native coronary artery without angina pectoris: Secondary | ICD-10-CM | POA: Diagnosis not present

## 2017-09-23 DIAGNOSIS — M4802 Spinal stenosis, cervical region: Secondary | ICD-10-CM | POA: Diagnosis not present

## 2017-09-23 DIAGNOSIS — G8191 Hemiplegia, unspecified affecting right dominant side: Secondary | ICD-10-CM | POA: Diagnosis not present

## 2017-09-23 DIAGNOSIS — E1142 Type 2 diabetes mellitus with diabetic polyneuropathy: Secondary | ICD-10-CM | POA: Diagnosis not present

## 2017-09-23 DIAGNOSIS — E1151 Type 2 diabetes mellitus with diabetic peripheral angiopathy without gangrene: Secondary | ICD-10-CM | POA: Diagnosis not present

## 2017-09-24 DIAGNOSIS — Z23 Encounter for immunization: Secondary | ICD-10-CM | POA: Diagnosis not present

## 2017-09-25 DIAGNOSIS — M4802 Spinal stenosis, cervical region: Secondary | ICD-10-CM | POA: Diagnosis not present

## 2017-09-25 DIAGNOSIS — I2510 Atherosclerotic heart disease of native coronary artery without angina pectoris: Secondary | ICD-10-CM | POA: Diagnosis not present

## 2017-09-25 DIAGNOSIS — E1142 Type 2 diabetes mellitus with diabetic polyneuropathy: Secondary | ICD-10-CM | POA: Diagnosis not present

## 2017-09-25 DIAGNOSIS — E1151 Type 2 diabetes mellitus with diabetic peripheral angiopathy without gangrene: Secondary | ICD-10-CM | POA: Diagnosis not present

## 2017-09-25 DIAGNOSIS — R600 Localized edema: Secondary | ICD-10-CM | POA: Diagnosis not present

## 2017-09-25 DIAGNOSIS — G8191 Hemiplegia, unspecified affecting right dominant side: Secondary | ICD-10-CM | POA: Diagnosis not present

## 2017-09-27 DIAGNOSIS — R600 Localized edema: Secondary | ICD-10-CM | POA: Diagnosis not present

## 2017-09-27 DIAGNOSIS — M4802 Spinal stenosis, cervical region: Secondary | ICD-10-CM | POA: Diagnosis not present

## 2017-09-27 DIAGNOSIS — E1151 Type 2 diabetes mellitus with diabetic peripheral angiopathy without gangrene: Secondary | ICD-10-CM | POA: Diagnosis not present

## 2017-09-27 DIAGNOSIS — E1142 Type 2 diabetes mellitus with diabetic polyneuropathy: Secondary | ICD-10-CM | POA: Diagnosis not present

## 2017-09-27 DIAGNOSIS — I2510 Atherosclerotic heart disease of native coronary artery without angina pectoris: Secondary | ICD-10-CM | POA: Diagnosis not present

## 2017-09-27 DIAGNOSIS — G8191 Hemiplegia, unspecified affecting right dominant side: Secondary | ICD-10-CM | POA: Diagnosis not present

## 2017-10-01 DIAGNOSIS — E1151 Type 2 diabetes mellitus with diabetic peripheral angiopathy without gangrene: Secondary | ICD-10-CM | POA: Diagnosis not present

## 2017-10-01 DIAGNOSIS — R600 Localized edema: Secondary | ICD-10-CM | POA: Diagnosis not present

## 2017-10-01 DIAGNOSIS — G8191 Hemiplegia, unspecified affecting right dominant side: Secondary | ICD-10-CM | POA: Diagnosis not present

## 2017-10-01 DIAGNOSIS — E1142 Type 2 diabetes mellitus with diabetic polyneuropathy: Secondary | ICD-10-CM | POA: Diagnosis not present

## 2017-10-01 DIAGNOSIS — I2510 Atherosclerotic heart disease of native coronary artery without angina pectoris: Secondary | ICD-10-CM | POA: Diagnosis not present

## 2017-10-01 DIAGNOSIS — M4802 Spinal stenosis, cervical region: Secondary | ICD-10-CM | POA: Diagnosis not present

## 2017-10-04 DIAGNOSIS — M4802 Spinal stenosis, cervical region: Secondary | ICD-10-CM | POA: Diagnosis not present

## 2017-10-04 DIAGNOSIS — E1142 Type 2 diabetes mellitus with diabetic polyneuropathy: Secondary | ICD-10-CM | POA: Diagnosis not present

## 2017-10-04 DIAGNOSIS — I2510 Atherosclerotic heart disease of native coronary artery without angina pectoris: Secondary | ICD-10-CM | POA: Diagnosis not present

## 2017-10-04 DIAGNOSIS — R600 Localized edema: Secondary | ICD-10-CM | POA: Diagnosis not present

## 2017-10-04 DIAGNOSIS — E1151 Type 2 diabetes mellitus with diabetic peripheral angiopathy without gangrene: Secondary | ICD-10-CM | POA: Diagnosis not present

## 2017-10-04 DIAGNOSIS — G8191 Hemiplegia, unspecified affecting right dominant side: Secondary | ICD-10-CM | POA: Diagnosis not present

## 2017-10-11 DIAGNOSIS — E1151 Type 2 diabetes mellitus with diabetic peripheral angiopathy without gangrene: Secondary | ICD-10-CM | POA: Diagnosis not present

## 2017-10-11 DIAGNOSIS — R600 Localized edema: Secondary | ICD-10-CM | POA: Diagnosis not present

## 2017-10-11 DIAGNOSIS — E1142 Type 2 diabetes mellitus with diabetic polyneuropathy: Secondary | ICD-10-CM | POA: Diagnosis not present

## 2017-10-11 DIAGNOSIS — M4802 Spinal stenosis, cervical region: Secondary | ICD-10-CM | POA: Diagnosis not present

## 2017-10-11 DIAGNOSIS — I2510 Atherosclerotic heart disease of native coronary artery without angina pectoris: Secondary | ICD-10-CM | POA: Diagnosis not present

## 2017-10-11 DIAGNOSIS — G8191 Hemiplegia, unspecified affecting right dominant side: Secondary | ICD-10-CM | POA: Diagnosis not present

## 2017-10-16 DIAGNOSIS — E1142 Type 2 diabetes mellitus with diabetic polyneuropathy: Secondary | ICD-10-CM | POA: Diagnosis not present

## 2017-10-16 DIAGNOSIS — Z794 Long term (current) use of insulin: Secondary | ICD-10-CM | POA: Diagnosis not present

## 2017-10-16 DIAGNOSIS — F3341 Major depressive disorder, recurrent, in partial remission: Secondary | ICD-10-CM | POA: Diagnosis not present

## 2017-10-17 DIAGNOSIS — M12811 Other specific arthropathies, not elsewhere classified, right shoulder: Secondary | ICD-10-CM | POA: Diagnosis not present

## 2017-10-17 DIAGNOSIS — M75101 Unspecified rotator cuff tear or rupture of right shoulder, not specified as traumatic: Secondary | ICD-10-CM | POA: Diagnosis not present

## 2017-10-25 DIAGNOSIS — R918 Other nonspecific abnormal finding of lung field: Secondary | ICD-10-CM | POA: Diagnosis not present

## 2017-10-25 DIAGNOSIS — R911 Solitary pulmonary nodule: Secondary | ICD-10-CM | POA: Diagnosis not present

## 2017-10-25 DIAGNOSIS — S199XXA Unspecified injury of neck, initial encounter: Secondary | ICD-10-CM | POA: Diagnosis not present

## 2017-10-25 DIAGNOSIS — R55 Syncope and collapse: Secondary | ICD-10-CM | POA: Diagnosis not present

## 2017-10-25 DIAGNOSIS — S3992XA Unspecified injury of lower back, initial encounter: Secondary | ICD-10-CM | POA: Diagnosis not present

## 2017-10-25 DIAGNOSIS — W19XXXA Unspecified fall, initial encounter: Secondary | ICD-10-CM | POA: Diagnosis not present

## 2017-10-25 DIAGNOSIS — S299XXA Unspecified injury of thorax, initial encounter: Secondary | ICD-10-CM | POA: Diagnosis not present

## 2017-10-25 DIAGNOSIS — S0990XA Unspecified injury of head, initial encounter: Secondary | ICD-10-CM | POA: Diagnosis not present

## 2017-10-25 DIAGNOSIS — E1165 Type 2 diabetes mellitus with hyperglycemia: Secondary | ICD-10-CM | POA: Diagnosis not present

## 2017-10-25 DIAGNOSIS — T82855A Stenosis of coronary artery stent, initial encounter: Secondary | ICD-10-CM | POA: Diagnosis not present

## 2017-10-25 DIAGNOSIS — E1143 Type 2 diabetes mellitus with diabetic autonomic (poly)neuropathy: Secondary | ICD-10-CM | POA: Diagnosis not present

## 2017-10-25 DIAGNOSIS — R410 Disorientation, unspecified: Secondary | ICD-10-CM | POA: Diagnosis not present

## 2017-10-25 DIAGNOSIS — I4892 Unspecified atrial flutter: Secondary | ICD-10-CM | POA: Diagnosis not present

## 2017-10-25 DIAGNOSIS — M542 Cervicalgia: Secondary | ICD-10-CM | POA: Diagnosis not present

## 2017-10-25 DIAGNOSIS — S3993XA Unspecified injury of pelvis, initial encounter: Secondary | ICD-10-CM | POA: Diagnosis not present

## 2017-10-25 DIAGNOSIS — S3991XA Unspecified injury of abdomen, initial encounter: Secondary | ICD-10-CM | POA: Diagnosis not present

## 2017-10-25 DIAGNOSIS — Z794 Long term (current) use of insulin: Secondary | ICD-10-CM | POA: Diagnosis not present

## 2017-10-26 DIAGNOSIS — I10 Essential (primary) hypertension: Secondary | ICD-10-CM | POA: Diagnosis not present

## 2017-10-26 DIAGNOSIS — R918 Other nonspecific abnormal finding of lung field: Secondary | ICD-10-CM | POA: Diagnosis not present

## 2017-10-26 DIAGNOSIS — D509 Iron deficiency anemia, unspecified: Secondary | ICD-10-CM | POA: Diagnosis present

## 2017-10-26 DIAGNOSIS — Z7902 Long term (current) use of antithrombotics/antiplatelets: Secondary | ICD-10-CM | POA: Diagnosis not present

## 2017-10-26 DIAGNOSIS — I499 Cardiac arrhythmia, unspecified: Secondary | ICD-10-CM | POA: Diagnosis not present

## 2017-10-26 DIAGNOSIS — R410 Disorientation, unspecified: Secondary | ICD-10-CM | POA: Diagnosis not present

## 2017-10-26 DIAGNOSIS — G3184 Mild cognitive impairment, so stated: Secondary | ICD-10-CM | POA: Diagnosis not present

## 2017-10-26 DIAGNOSIS — R41841 Cognitive communication deficit: Secondary | ICD-10-CM | POA: Diagnosis not present

## 2017-10-26 DIAGNOSIS — T82855A Stenosis of coronary artery stent, initial encounter: Secondary | ICD-10-CM | POA: Diagnosis present

## 2017-10-26 DIAGNOSIS — R531 Weakness: Secondary | ICD-10-CM | POA: Diagnosis not present

## 2017-10-26 DIAGNOSIS — Z87891 Personal history of nicotine dependence: Secondary | ICD-10-CM | POA: Diagnosis not present

## 2017-10-26 DIAGNOSIS — I2510 Atherosclerotic heart disease of native coronary artery without angina pectoris: Secondary | ICD-10-CM | POA: Diagnosis not present

## 2017-10-26 DIAGNOSIS — F329 Major depressive disorder, single episode, unspecified: Secondary | ICD-10-CM | POA: Diagnosis present

## 2017-10-26 DIAGNOSIS — K219 Gastro-esophageal reflux disease without esophagitis: Secondary | ICD-10-CM | POA: Diagnosis present

## 2017-10-26 DIAGNOSIS — R911 Solitary pulmonary nodule: Secondary | ICD-10-CM | POA: Diagnosis not present

## 2017-10-26 DIAGNOSIS — R2990 Unspecified symptoms and signs involving the nervous system: Secondary | ICD-10-CM | POA: Diagnosis not present

## 2017-10-26 DIAGNOSIS — I4892 Unspecified atrial flutter: Secondary | ICD-10-CM | POA: Diagnosis not present

## 2017-10-26 DIAGNOSIS — R402142 Coma scale, eyes open, spontaneous, at arrival to emergency department: Secondary | ICD-10-CM | POA: Diagnosis present

## 2017-10-26 DIAGNOSIS — I4891 Unspecified atrial fibrillation: Secondary | ICD-10-CM | POA: Diagnosis not present

## 2017-10-26 DIAGNOSIS — R402362 Coma scale, best motor response, obeys commands, at arrival to emergency department: Secondary | ICD-10-CM | POA: Diagnosis present

## 2017-10-26 DIAGNOSIS — R590 Localized enlarged lymph nodes: Secondary | ICD-10-CM | POA: Diagnosis present

## 2017-10-26 DIAGNOSIS — E669 Obesity, unspecified: Secondary | ICD-10-CM | POA: Diagnosis present

## 2017-10-26 DIAGNOSIS — M6281 Muscle weakness (generalized): Secondary | ICD-10-CM | POA: Diagnosis not present

## 2017-10-26 DIAGNOSIS — E1165 Type 2 diabetes mellitus with hyperglycemia: Secondary | ICD-10-CM | POA: Diagnosis present

## 2017-10-26 DIAGNOSIS — R262 Difficulty in walking, not elsewhere classified: Secondary | ICD-10-CM | POA: Diagnosis not present

## 2017-10-26 DIAGNOSIS — Z8673 Personal history of transient ischemic attack (TIA), and cerebral infarction without residual deficits: Secondary | ICD-10-CM | POA: Diagnosis not present

## 2017-10-26 DIAGNOSIS — E785 Hyperlipidemia, unspecified: Secondary | ICD-10-CM | POA: Diagnosis present

## 2017-10-26 DIAGNOSIS — Z794 Long term (current) use of insulin: Secondary | ICD-10-CM | POA: Diagnosis not present

## 2017-10-26 DIAGNOSIS — S199XXA Unspecified injury of neck, initial encounter: Secondary | ICD-10-CM | POA: Diagnosis not present

## 2017-10-26 DIAGNOSIS — W19XXXA Unspecified fall, initial encounter: Secondary | ICD-10-CM | POA: Diagnosis not present

## 2017-10-26 DIAGNOSIS — R55 Syncope and collapse: Secondary | ICD-10-CM | POA: Diagnosis not present

## 2017-10-26 DIAGNOSIS — R402242 Coma scale, best verbal response, confused conversation, at arrival to emergency department: Secondary | ICD-10-CM | POA: Diagnosis present

## 2017-10-26 DIAGNOSIS — R296 Repeated falls: Secondary | ICD-10-CM | POA: Diagnosis not present

## 2017-10-26 DIAGNOSIS — E119 Type 2 diabetes mellitus without complications: Secondary | ICD-10-CM | POA: Diagnosis not present

## 2017-10-26 DIAGNOSIS — G4733 Obstructive sleep apnea (adult) (pediatric): Secondary | ICD-10-CM | POA: Diagnosis present

## 2017-10-26 DIAGNOSIS — E1143 Type 2 diabetes mellitus with diabetic autonomic (poly)neuropathy: Secondary | ICD-10-CM | POA: Diagnosis present

## 2017-10-26 DIAGNOSIS — I440 Atrioventricular block, first degree: Secondary | ICD-10-CM | POA: Diagnosis present

## 2017-10-26 DIAGNOSIS — Z7982 Long term (current) use of aspirin: Secondary | ICD-10-CM | POA: Diagnosis not present

## 2017-10-29 DIAGNOSIS — R55 Syncope and collapse: Secondary | ICD-10-CM | POA: Diagnosis not present

## 2017-10-29 DIAGNOSIS — M6281 Muscle weakness (generalized): Secondary | ICD-10-CM | POA: Diagnosis not present

## 2017-10-29 DIAGNOSIS — R262 Difficulty in walking, not elsewhere classified: Secondary | ICD-10-CM | POA: Diagnosis not present

## 2017-10-29 DIAGNOSIS — I10 Essential (primary) hypertension: Secondary | ICD-10-CM | POA: Diagnosis not present

## 2017-10-29 DIAGNOSIS — G3184 Mild cognitive impairment, so stated: Secondary | ICD-10-CM | POA: Diagnosis not present

## 2017-10-29 DIAGNOSIS — R531 Weakness: Secondary | ICD-10-CM | POA: Diagnosis not present

## 2017-10-29 DIAGNOSIS — I4892 Unspecified atrial flutter: Secondary | ICD-10-CM | POA: Diagnosis not present

## 2017-10-29 DIAGNOSIS — R296 Repeated falls: Secondary | ICD-10-CM | POA: Diagnosis not present

## 2017-10-29 DIAGNOSIS — R41841 Cognitive communication deficit: Secondary | ICD-10-CM | POA: Diagnosis not present

## 2017-10-29 DIAGNOSIS — E119 Type 2 diabetes mellitus without complications: Secondary | ICD-10-CM | POA: Diagnosis not present

## 2017-10-29 DIAGNOSIS — I2510 Atherosclerotic heart disease of native coronary artery without angina pectoris: Secondary | ICD-10-CM | POA: Diagnosis not present

## 2017-10-29 DIAGNOSIS — R2990 Unspecified symptoms and signs involving the nervous system: Secondary | ICD-10-CM | POA: Diagnosis not present

## 2017-10-29 DIAGNOSIS — R918 Other nonspecific abnormal finding of lung field: Secondary | ICD-10-CM | POA: Diagnosis not present

## 2017-10-29 DIAGNOSIS — R609 Edema, unspecified: Secondary | ICD-10-CM | POA: Diagnosis not present

## 2017-10-29 DIAGNOSIS — I4891 Unspecified atrial fibrillation: Secondary | ICD-10-CM | POA: Diagnosis not present

## 2019-06-20 DEATH — deceased
